# Patient Record
Sex: Female | Born: 1935 | Race: White | Hispanic: No | Smoking: Former smoker
Health system: Southern US, Community
[De-identification: ages and names within clinical notes are randomized; demographics above are authoritative.]

## PROBLEM LIST (undated history)

## (undated) DIAGNOSIS — I639 Cerebral infarction, unspecified: Secondary | ICD-10-CM

## (undated) DIAGNOSIS — G459 Transient cerebral ischemic attack, unspecified: Secondary | ICD-10-CM

## (undated) DIAGNOSIS — I519 Heart disease, unspecified: Secondary | ICD-10-CM

## (undated) DIAGNOSIS — K922 Gastrointestinal hemorrhage, unspecified: Secondary | ICD-10-CM

## (undated) DIAGNOSIS — N189 Chronic kidney disease, unspecified: Secondary | ICD-10-CM

## (undated) DIAGNOSIS — I219 Acute myocardial infarction, unspecified: Secondary | ICD-10-CM

## (undated) DIAGNOSIS — E039 Hypothyroidism, unspecified: Secondary | ICD-10-CM

## (undated) DIAGNOSIS — I714 Abdominal aortic aneurysm, without rupture, unspecified: Secondary | ICD-10-CM

## (undated) DIAGNOSIS — M810 Age-related osteoporosis without current pathological fracture: Secondary | ICD-10-CM

## (undated) DIAGNOSIS — D649 Anemia, unspecified: Secondary | ICD-10-CM

## (undated) DIAGNOSIS — Z5189 Encounter for other specified aftercare: Secondary | ICD-10-CM

## (undated) DIAGNOSIS — IMO0001 Reserved for inherently not codable concepts without codable children: Secondary | ICD-10-CM

## (undated) DIAGNOSIS — Z96641 Presence of right artificial hip joint: Secondary | ICD-10-CM

## (undated) DIAGNOSIS — I10 Essential (primary) hypertension: Secondary | ICD-10-CM

## (undated) DIAGNOSIS — I509 Heart failure, unspecified: Secondary | ICD-10-CM

## (undated) DIAGNOSIS — I2510 Atherosclerotic heart disease of native coronary artery without angina pectoris: Secondary | ICD-10-CM

## (undated) DIAGNOSIS — I4891 Unspecified atrial fibrillation: Secondary | ICD-10-CM

## (undated) DIAGNOSIS — J449 Chronic obstructive pulmonary disease, unspecified: Secondary | ICD-10-CM

## (undated) DIAGNOSIS — E785 Hyperlipidemia, unspecified: Secondary | ICD-10-CM

## (undated) DIAGNOSIS — N390 Urinary tract infection, site not specified: Secondary | ICD-10-CM

## (undated) DIAGNOSIS — I495 Sick sinus syndrome: Secondary | ICD-10-CM

## (undated) DIAGNOSIS — Z950 Presence of cardiac pacemaker: Secondary | ICD-10-CM

## (undated) DIAGNOSIS — Z8719 Personal history of other diseases of the digestive system: Secondary | ICD-10-CM

## (undated) DIAGNOSIS — K219 Gastro-esophageal reflux disease without esophagitis: Secondary | ICD-10-CM

## (undated) HISTORY — DX: Cerebral infarction, unspecified: I63.9

## (undated) HISTORY — PX: JOINT REPLACEMENT: SHX530

## (undated) HISTORY — PX: FRACTURE SURGERY: SHX138

## (undated) HISTORY — DX: Heart failure, unspecified: I50.9

## (undated) HISTORY — DX: Chronic obstructive pulmonary disease, unspecified: J44.9

## (undated) HISTORY — DX: Abdominal aortic aneurysm, without rupture: I71.4

## (undated) HISTORY — DX: Transient cerebral ischemic attack, unspecified: G45.9

## (undated) HISTORY — DX: Sick sinus syndrome: I49.5

## (undated) HISTORY — DX: Chronic kidney disease, unspecified: N18.9

## (undated) HISTORY — DX: Age-related osteoporosis without current pathological fracture: M81.0

## (undated) HISTORY — DX: Unspecified atrial fibrillation: I48.91

## (undated) HISTORY — PX: CARDIAC CATHETERIZATION: SHX172

## (undated) HISTORY — DX: Acute myocardial infarction, unspecified: I21.9

## (undated) HISTORY — DX: Abdominal aortic aneurysm, without rupture, unspecified: I71.40

## (undated) HISTORY — DX: Hypothyroidism, unspecified: E03.9

## (undated) HISTORY — DX: Atherosclerotic heart disease of native coronary artery without angina pectoris: I25.10

## (undated) HISTORY — PX: TOTAL HIP ARTHROPLASTY: SHX124

## (undated) HISTORY — PX: COLONOSCOPY: SHX174

## (undated) HISTORY — DX: Presence of right artificial hip joint: Z96.641

## (undated) HISTORY — DX: Encounter for other specified aftercare: Z51.89

## (undated) HISTORY — PX: CORONARY STENT PLACEMENT: SHX1402

## (undated) HISTORY — DX: Gastrointestinal hemorrhage, unspecified: K92.2

## (undated) HISTORY — DX: Hyperlipidemia, unspecified: E78.5

## (undated) HISTORY — DX: Essential (primary) hypertension: I10

## (undated) HISTORY — DX: Heart disease, unspecified: I51.9

---

## 1999-03-19 ENCOUNTER — Encounter: Payer: Self-pay | Admitting: Family Medicine

## 1999-03-19 ENCOUNTER — Encounter: Admission: RE | Admit: 1999-03-19 | Discharge: 1999-03-19 | Payer: Self-pay | Admitting: Family Medicine

## 2000-04-21 ENCOUNTER — Encounter: Payer: Self-pay | Admitting: Family Medicine

## 2000-04-21 ENCOUNTER — Encounter: Admission: RE | Admit: 2000-04-21 | Discharge: 2000-04-21 | Payer: Self-pay | Admitting: Family Medicine

## 2004-08-14 ENCOUNTER — Inpatient Hospital Stay (HOSPITAL_COMMUNITY): Admission: EM | Admit: 2004-08-14 | Discharge: 2004-08-22 | Payer: Self-pay | Admitting: Emergency Medicine

## 2005-03-15 DIAGNOSIS — K922 Gastrointestinal hemorrhage, unspecified: Secondary | ICD-10-CM

## 2005-03-15 HISTORY — DX: Gastrointestinal hemorrhage, unspecified: K92.2

## 2005-09-22 ENCOUNTER — Encounter: Admission: RE | Admit: 2005-09-22 | Discharge: 2005-09-22 | Payer: Self-pay | Admitting: Family Medicine

## 2005-09-27 ENCOUNTER — Encounter: Admission: RE | Admit: 2005-09-27 | Discharge: 2005-09-27 | Payer: Self-pay | Admitting: Cardiology

## 2005-11-24 ENCOUNTER — Ambulatory Visit: Payer: Self-pay | Admitting: Internal Medicine

## 2005-12-01 ENCOUNTER — Inpatient Hospital Stay (HOSPITAL_COMMUNITY): Admission: EM | Admit: 2005-12-01 | Discharge: 2005-12-08 | Payer: Self-pay | Admitting: Emergency Medicine

## 2005-12-08 ENCOUNTER — Ambulatory Visit: Payer: Self-pay | Admitting: Internal Medicine

## 2006-02-01 ENCOUNTER — Ambulatory Visit: Payer: Self-pay | Admitting: Internal Medicine

## 2006-02-07 ENCOUNTER — Ambulatory Visit (HOSPITAL_COMMUNITY): Admission: RE | Admit: 2006-02-07 | Discharge: 2006-02-07 | Payer: Self-pay | Admitting: Internal Medicine

## 2006-02-15 ENCOUNTER — Inpatient Hospital Stay (HOSPITAL_COMMUNITY): Admission: EM | Admit: 2006-02-15 | Discharge: 2006-02-21 | Payer: Self-pay | Admitting: Emergency Medicine

## 2006-02-18 ENCOUNTER — Ambulatory Visit: Payer: Self-pay | Admitting: Internal Medicine

## 2006-03-24 ENCOUNTER — Encounter (INDEPENDENT_AMBULATORY_CARE_PROVIDER_SITE_OTHER): Payer: Self-pay | Admitting: Cardiology

## 2006-03-24 ENCOUNTER — Inpatient Hospital Stay (HOSPITAL_COMMUNITY): Admission: EM | Admit: 2006-03-24 | Discharge: 2006-03-29 | Payer: Self-pay | Admitting: Emergency Medicine

## 2006-07-01 ENCOUNTER — Emergency Department (HOSPITAL_COMMUNITY): Admission: EM | Admit: 2006-07-01 | Discharge: 2006-07-02 | Payer: Self-pay | Admitting: Emergency Medicine

## 2007-02-27 ENCOUNTER — Inpatient Hospital Stay (HOSPITAL_COMMUNITY): Admission: EM | Admit: 2007-02-27 | Discharge: 2007-03-02 | Payer: Self-pay | Admitting: Emergency Medicine

## 2007-02-27 ENCOUNTER — Encounter (INDEPENDENT_AMBULATORY_CARE_PROVIDER_SITE_OTHER): Payer: Self-pay | Admitting: Internal Medicine

## 2007-03-01 ENCOUNTER — Ambulatory Visit: Payer: Self-pay | Admitting: Vascular Surgery

## 2007-03-27 ENCOUNTER — Inpatient Hospital Stay (HOSPITAL_COMMUNITY): Admission: EM | Admit: 2007-03-27 | Discharge: 2007-03-29 | Payer: Self-pay | Admitting: Emergency Medicine

## 2007-03-31 ENCOUNTER — Ambulatory Visit: Payer: Self-pay | Admitting: Internal Medicine

## 2007-10-02 ENCOUNTER — Inpatient Hospital Stay (HOSPITAL_COMMUNITY): Admission: EM | Admit: 2007-10-02 | Discharge: 2007-10-09 | Payer: Self-pay | Admitting: Emergency Medicine

## 2007-10-05 DIAGNOSIS — I495 Sick sinus syndrome: Secondary | ICD-10-CM

## 2007-10-05 HISTORY — DX: Sick sinus syndrome: I49.5

## 2007-10-05 HISTORY — PX: PACEMAKER INSERTION: SHX728

## 2008-04-22 LAB — HM DEXA SCAN

## 2008-09-11 ENCOUNTER — Encounter: Admission: RE | Admit: 2008-09-11 | Discharge: 2008-09-11 | Payer: Self-pay | Admitting: Otolaryngology

## 2009-09-19 LAB — HM MAMMOGRAPHY: HM Mammogram: NORMAL

## 2010-07-20 ENCOUNTER — Telehealth: Payer: Self-pay | Admitting: Internal Medicine

## 2010-07-21 ENCOUNTER — Telehealth: Payer: Self-pay | Admitting: Internal Medicine

## 2010-07-21 NOTE — Telephone Encounter (Signed)
Scheduled patient to see Willette Cluster, NP on 07/22/10 at 2:30 PM

## 2010-07-21 NOTE — Telephone Encounter (Signed)
The patient's other daughter called back and she is given the appt for 07/22/10 2:00

## 2010-07-21 NOTE — Telephone Encounter (Signed)
Left message for patient to call back to Solon Augusta the patient's daughter [phone]

## 2010-07-22 ENCOUNTER — Encounter: Payer: Self-pay | Admitting: Nurse Practitioner

## 2010-07-22 ENCOUNTER — Ambulatory Visit (INDEPENDENT_AMBULATORY_CARE_PROVIDER_SITE_OTHER): Payer: Medicare Other | Admitting: Nurse Practitioner

## 2010-07-22 VITALS — BP 90/42 | HR 84 | Ht 62.0 in | Wt 129.0 lb

## 2010-07-22 DIAGNOSIS — R1310 Dysphagia, unspecified: Secondary | ICD-10-CM

## 2010-07-22 NOTE — Patient Instructions (Signed)
We have scheduled the Barium Swallow with Tablet at [facility]. Date is 07-28-2010. Arrive at [facility] 1st floor We will call you with the results.

## 2010-07-22 NOTE — Progress Notes (Signed)
07/22/2010 [name] [id number] 1917-06-18   History of Present Illness:  [name] is a 77 year old female with multiple medical problems including but not limited to, coronary artery disease with history of stents, atrial fibrillation , pacemaker, history of transient ischemic attack, hypertension, COPD and hypothyroidism. She is on chronic Plavix. Patient was last seen by [place] for anemia and Hemoccult-positive stool while hospitalized in 2009. At that time she underwent an upper endoscopy with APC of nonbleeding angiodysplasia. Prior to that encounter the patient was evaluated by [place] for dysphasia 2007. At that time she had an upper endoscopy with empirical dilation. Within a couple of months her dysphasia returned and a barium swallow with tablet was obtained for suspected presbyesophagus. The study showed normal esophageal motility and moderate gastroesophageal reflux with siphon test .The tablet passed through the esophagus without difficulty. Over the years the patient has been managing her symptoms but her daughter called here yesterday to report a recent 8 pound weight loss. Patient feels her "swallowing" is okay but she just gets full after a few bites of food. Generally eats part of the medial and returns 2 hours later to complete the rest. This sounds similar to what she was experiencing back in 2007. No nausea. No abdominal or chest pain. Patient has no GERD symptoms. She takes Zantac 300 mg daily.        Past Medical History  Diagnosis Date  . GI bleed 2007  . Atrial fibrillation   . CAD (coronary artery disease)   . COPD (chronic obstructive pulmonary disease)   . Hypertension   . TIA (transient ischemic attack)   . Hypothyroidism   . Renal failure, chronic   . Abdominal aortic aneurysm    Past Surgical History  Procedure Date  . Pacemaker insertion   . Total hip arthroplasty   . Coronary stent placement     x9    reports that she has quit smoking. She has never used  smokeless tobacco. She reports that she does not drink alcohol or use illicit drugs. family history includes Emphysema in her father and Heart disease in her mother. Allergies  Allergen Reactions  . Nubain (Nalbuphine Hcl) Anaphylaxis  . Iodine       Outpatient Encounter Prescriptions as of 07/22/2010  Medication Sig Dispense Refill  . amiodarone (PACERONE) 200 MG tablet Take 200 mg by mouth daily.        [name] aspirin 81 MG tablet Take 81 mg by mouth daily.        . calcium-vitamin D (OSCAL WITH D) 500-200 MG-UNIT per tablet Take 1 tablet by mouth daily.        . clopidogrel (PLAVIX) 75 MG tablet Take 75 mg by mouth daily.        . ferrous sulfate 325 (65 FE) MG tablet Take 325 mg by mouth daily with breakfast.        . furosemide (LASIX) 20 MG tablet Take 20 mg by mouth every other day.        . levothyroxine (SYNTHROID, LEVOTHROID) 100 MCG tablet Take 100 mcg by mouth daily.        . metoprolol succinate (TOPROL-XL) 25 MG 24 hr tablet Take 37.5 mg by mouth daily. Take 1 & 1/2 tablet twice daily       . potassium chloride (KLOR-CON) 10 MEQ CR tablet Take 10 mEq by mouth every other day.        . pravastatin (PRAVACHOL) 40 MG tablet Take 40 mg by  mouth at bedtime.        . ranitidine (ZANTAC) 300 MG tablet Take 300 mg by mouth at bedtime.        . vitamin B-12 (CYANOCOBALAMIN) 100 MCG tablet Take 50 mcg by mouth daily.        [name] DISCONTD: pantoprazole (PROTONIX) 40 MG tablet Take 40 mg by mouth daily.          ROS: All other systems reviewed and negative except where noted in the History of Present Illness.   Physical Exam: General: Well developed white female in no acute distress Head: Normocephalic and atraumatic Eyes:  sclerae anicteric,conjunctive pink. Ears: Normal auditory acuity Mouth: No deformity or lesions Neck: Supple, no masses.  Lungs: Clear throughout to auscultation Heart: Regular rate and rhythm; no murmurs heard Abdomen: Soft, non tender and non distended. No masses  or hepatomegaly noted. Normal Bowel sounds Rectal: Not done Musculoskeletal: Symmetrical with no gross deformities  Skin: No lesions on visible extremities Extremities: No edema or deformities noted Neurological: Alert oriented x 4, grossly nonfocal Cervical Nodes:  No significant cervical adenopathy Psychological:  Alert and cooperative. Normal mood and affect  Assessment and Plan: Dysphasia: Not entirely clear what is going on but the patient describes feeling full after a few bites of food. She points to her chest as the area of fullness. Patient described similar symptoms in 2007 when an EGD and barium swallow with tablet were normal. This could be early satiety instead of dysphasia. Patient could have gastroparesis despite the fact that she has no nausea. Even if that is the case, I would be concerned about a trial of Reglan in this 77 year old patient. I  think that the risk of upper endoscopy with esophageal dilation outweighs the benefits but will obtain a barium swallow with tablet to make sure that nothing has changed over the last 5 years. Patient will be called with test results and further recommendations. In the meantime we discussed ways to avoid food impaction (soft foods, small bites, chewing well, adequate fluid intake with meals). Patient has no history of esophagitis. She has no GERD symptoms. Daily Zantac is fine for now.

## 2010-07-23 NOTE — Progress Notes (Signed)
Reviewed and agree with management. Sevrin Sally T. Hrishikesh Hoeg MD FACG 

## 2010-07-28 ENCOUNTER — Ambulatory Visit (HOSPITAL_COMMUNITY)
Admission: RE | Admit: 2010-07-28 | Discharge: 2010-07-28 | Disposition: A | Discharge status: Home / Self Care | Payer: Medicare Other | Source: Ambulatory Visit | Attending: Nurse Practitioner | Admitting: Nurse Practitioner

## 2010-07-28 DIAGNOSIS — K219 Gastro-esophageal reflux disease without esophagitis: Secondary | ICD-10-CM | POA: Insufficient documentation

## 2010-07-28 DIAGNOSIS — R1310 Dysphagia, unspecified: Secondary | ICD-10-CM

## 2010-07-28 DIAGNOSIS — K224 Dyskinesia of esophagus: Secondary | ICD-10-CM | POA: Insufficient documentation
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
## 2010-07-28 NOTE — Procedures (Signed)
CLINICAL HISTORY:  An 77 year old woman with a history of transient  right facial droop and garbled speech.  EEG is for evaluation of a  possible seizure.   DESCRIPTION:  The dominant rhythm of this tracing is a moderate  amplitude alpha rhythm of 8-9 Hz which predominates posteriorly, appears  without abnormal asymmetry, attenuates with opening and closing.  Low  amplitude fast activity is seen frontally and centrally.  On the left,  fairly consistently, a 6-7 Hz rhythm emanates from the temporoparietal  areas, which is not seen on the right.  No epileptiform discharges were  seen.  The patient appeared to remain in the awake state throughout the  recording.  Photic stimulation and hyperventilation were not performed.  Single channel devoted to EKG revealed sinus rhythm throughout, the rate  approximately 54 beats per minute.   CONCLUSIONS:  Abnormal study due to the presence of:  1. Borderline slowing of the background rhythms, a finding related      either to aging or potentially to the diffuse mild widespread      cerebral dysfunction as seen in encephalopathy or dementia.  2. Focal slowing of the left hemisphere, a finding suggestive of      underlying focal pathology.  3. Sinus bradycardia on EKG tracing.  No epileptiform discharges seen.      Michael L. Thad Ranger, M.D.  Electronically Signed     OVF:IEPP  D:  03/01/2007 17:49:36  T:  03/02/2007 11:29:17  Job #:  [id number]
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
## 2010-07-28 NOTE — Discharge Summary (Signed)
NAMEKATELY, GRAFFAM NO.:  [id number]   MEDICAL RECORD NO.:  [id number]          PATIENT TYPE:  INP   LOCATION:  4712                         FACILITY:  [facility]   PHYSICIAN:  Ritta Slot, MD     DATE OF BIRTH:  [birth year]   DATE OF ADMISSION:  10/02/2007  DATE OF DISCHARGE:  10/09/2007                               DISCHARGE SUMMARY   Ms. Shadavia Dampier is an 77 year old female patient, very active, still  driving her Red Camaro, came into the hospital with shortness of breath  and increased heart rate.  She does have a history of PAF and sick sinus  syndrome.  She has been on amiodarone in the past, but she had an  allergic reaction and rash as she states from that plus she had  bradycardia and she was taken off her amiodarone and beta-blockers in  the past; this had to be decreased.  She has not been on Coumadin  recently in the past because of GI bleeds; the last one being on March 28, 2007.  She has had a history of duodenal AVMs, duodenitis, and an  ulcer in 2007.  She did have an AVM ablation on March 28, 2007.  She  had an increased bleed after aspirin was added to Plavix because of a  history of TIAs.  She came into the hospital, it was felt that she could  be tried on Bystolic; this was attempted, but she still went into atrial  fib on the Bystolic, and this was discontinued then and we were going to  try Betapace; however, because of her renal function, she is not able to  take Betapace.  It was discussed with her about possible pacemaker by  Dr. Lynnea Ferrier after her and her family talked, this was decided that she  should have pacemaker implantation.  She apparently had a lot of  palpitations at home where it can last for hours at a high heart rate.  Thus, on October 05, 2007, she had a dual-chamber pacemaker implanted; a  Medtronic Adapta ADDRS1, serial number [id number].  She had two  Medtronic leads placed.  The following day, with her pacemaker  interrogation, it was suspected that her atrial lead had fallen out of  her atrium and entered her ventricle.  She was made n.p.o.  Dr. Lynnea Ferrier  was called and he came in and repositioned the atrial lead.  She was  kept over the weekend on IV antibiotics, specifically Ancef.  She had no  further problems, except that on October 09, 2007, early morning, she had a  15-minute episode of Afib, rate of 100-110.  She felt like her heart was  pounding in her throat.  She felt that it lasted much longer than 15  minutes.  She was seen by Dr. Lynnea Ferrier on October 09, 2007, he decided to  increase her metoprolol to 37.5 b.i.d. and to put her on Augmentin  500/125 b.i.d. until October 13, 2007.  He will see her in the office for  wound site check and he considered her stable for discharge  home.   Her blood pressure was 113/47, heart rate was 54, respirations 20, and  temperature was 97.6.   LABORATORY DATA:  Showed hemoglobin of 12.3, hematocrit 36.9, WBC 6.1,  and platelets were low at 89.  Sodium of 138, potassium 4.5, BUN 30,  creatinine 1.47, and glucose was 88.   DISCHARGE MEDICATIONS:  1. Plavix 75 mg daily.  2. Furosemide 20 mg on Monday, Wednesday, and Friday.  3. Metoprolol tartrate one and a half 25 mg tablet twice per day.  4. Pravastatin 40 mg at bedtime.  5. Protonix 40 mg a day.  6. Synthroid 100 mcg every day.  7. Klor-Con 10 mEq Monday, Wednesday, and Friday.  8. Iron 325 mg a day.  9. Vitamin B12 daily.  10.Calcium daily.  11.She should start Augmentin 500/125 twice per day until October 13, 2007.   She was given pacemaker instructions.  She was told by Dr. Lynnea Ferrier about  her pacemaker instructions.  She should not drive for 1 week.  She may  shower on October 14, 2007.   DISCHARGE DIAGNOSES:  1. Paroxysmal atrial fibrillation/sick sinus syndrome.  2. Status post Medtronic pacemaker insertion on October 05, 2007,      complicated with lead displacement with repositioning and  revision      of the atrial lead on October 06, 2007.  3. Thrombocytopenia.  4. History of duodenal arteriovenous malformations, duodenitis, and      ulcer in December 2007.  She had recurrent gastrointestinal bleed      after aspirin was added to her Plavix because of a transient      ischemic attack and had an arteriovenous malformation ablation on      March 28, 2007.  5. History of transient ischemic attack.  6. History of coronary artery disease with drug-eluting stenting in      2006.  7. History of dysphagia and esophageal dilatation.  8. Hypothyroidism.  9. History of hypertension.  10.Chronic obstructive pulmonary disease.  11.Chronic renal failure.  12.Abdominal aortic aneurysm.  13.History of right hip fracture and repair.  14.Atherosclerotic pulmonary vascular disease, status post right iliac      artery stent, 70% left renal artery stent and her AAA was 2.5 x      2.5.      Lezlie Octave, N.P.      Ritta Slot, MD  Electronically Signed    BB/MEDQ  D:  10/09/2007  T:  10/10/2007  Job:  [id number]   cc:   Gerlene Burdock A. Alanda Amass, M.D.  Tammy R. Collins Scotland, M.D.

## 2010-07-28 NOTE — Discharge Summary (Signed)
[name], [name] NO.:  [id number]   MEDICAL RECORD NO.:  [id number]          PATIENT TYPE:  INP   LOCATION:  2011                         FACILITY:  [facility]   PHYSICIAN:  Richard A. Alanda Amass, M.D.DATE OF BIRTH:  07/30/1917   DATE OF ADMISSION:  03/27/2007  DATE OF DISCHARGE:  03/29/2007                               DISCHARGE SUMMARY   DISCHARGE DIAGNOSES:  1. Gastrointestinal bleed this admission, aspirin discontinued.  2. Dyspnea and fatigue secondary to above.  3. Anemia, blood loss, transfused this admission.  4. Question of a recent transient ischemic attack 3 weeks ago.  5. History of paroxysmal atrial fibrillation with bradycardia on beta      blocker and amiodarone.  Currently off both these drugs.  6. Known coronary disease with prior DES to RCA June, 2006.  Will      continue her on Plavix.  7. Mild renal insufficiency with creatinine 1.6.   HOSPITAL COURSE:  [name] is an 77 year old female well known to Dr.  Alanda Amass with a history of coronary artery disease and PAF as noted  above.  A few weeks ago, it was suspected she might have had a TIA and  aspirin was added to her Plavix.  She does have a history of GI bleeding  and AVMs.  She presented to the office March 27, 2007 with weakness  and dyspnea on exertion.  Her hemoglobin was 9.9.  She gave a history of  tarry stools.  She was admitted to telemetry and her aspirin was  stopped.  She was transfused 2 units.  She was seen in consult by  [name].  Her hemoglobin improved to 12 after transfusion and  her symptoms improved.  She was taken down to the endoscopy lab March 28, 2007.  Endoscopy revealed a duodenal AVM that was ablated by Dr.  Marina [name].  We feel she can be discharged March 29, 2007.  Plan is for no  aspirin.  She will continue her Plavix.   DISCHARGE MEDICATIONS:  1. B12 daily.  2. Protonix 40 mg daily.  3. Plavix 75 mg daily.  4. Synthroid 0.1 mg daily.  5.  Potassium 10 mEq daily.  6. Lasix 20 mg daily.   LABS:  At discharge, white count 5.3, hemoglobin 12.2, hematocrit 36.1,  platelets 202.  Sodium 140, potassium 4.5, BUN 19, creatinine 1.67.  Troponin negative.  LDL 94, HDL 34.  BNP 238.  Telemetry sinus rhythm.   DISPOSITION:  The patient discharged in stable condition and will follow  up with Dr. Alanda Amass in a couple of weeks in the office.      [name], P.A.      Richard A. Alanda Amass, M.D.  Electronically Signed    LKK/MEDQ  D:  03/29/2007  T:  03/29/2007  Job:  [id number]   cc:   [name] GI  [name]. Juanda Chance, MD  Richard A. Alanda Amass, M.D.

## 2010-07-28 NOTE — H&P (Signed)
[name], [name] NO.:  [id number]   MEDICAL RECORD NO.:  [id number]          PATIENT TYPE:  EMS   LOCATION:  ED                           [facility]ACILITY[facility]   PHYSICIAN:  Hillery Aldo, M.D.   DATE OF BIRTH:  August 29, 1917   DATE OF ADMISSION:  02/27/2007  DATE OF DISCHARGE:                              HISTORY & PHYSICAL   PRIMARY CARE PHYSICIAN:  Tammy R. Collins Scotland, M.D.   CARDIOLOGIST:  Richard A. Alanda Amass, M.D.   CHIEF COMPLAINT:  Swallowing, speaking difficulties.   HISTORY OF PRESENT ILLNESS:  The patient is an 77 year old female who  developed the sudden onset of word finding difficulty and facial  weakness witnessed by her daughter while driving the car today.  The  patient seemed to have a strangling sensation.  The patient's daughter  became concerned and gave her an aspirin to take and subsequently called  9-1-1.  The patient's speech gradually improved but, according to her  daughter, is not yet at baseline.  She has had some intermittent  headaches but none currently.  She has a history of chronic dysphagia  and has had to have esophageal dilatation in the past.  However, the  dysphagia she experienced earlier today was new and different than her  usual baseline dysphagia.  The only other significant complaint she has  had is poor sleep and generalized pruritus with a rash of uncertain  etiology.   PAST MEDICAL HISTORY:  1. Three-vessel coronary artery disease status post stenting of the      LAD, circumflex, and right coronary artery.  2. Chronic obstructive pulmonary disease.  3. Hypertension.  4. Abdominal abdominal aortic aneurysm.  5. Hiatal hernia with gastroesophageal reflux disease.  6. Paroxysmal atrial fibrillation.  7. Dyslipidemia.  8. 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  9. Hypothyroidism.  10.Chronic renal insufficiency.  11.Chronic dysphagia status post esophageal dilatation.  12.Right hip fracture status  post repair.  13.Peripheral vascular disease status post right iliac artery stent.  14.Renal artery stenosis, 70% on the left.  15.History of esophagitis.  16.Carotid artery stenosis status post PCI on the right.   FAMILY HISTORY:  The patient's mother died at 5 from old age.  She had  a history of breast cancer as well.  The patient's father died at 35  from complications of COPD.  She has one brother who died in a motor  vehicle accident, another brother who died of esophageal cancer, and a  sister who died of possibly Graves' disease as well as breast cancer.  She has a sister who is alive and currently suffers with recurrent GI  bleeding with colectomy x2.  She has four healthy offspring.   SOCIAL HISTORY:  The patient is married and lives with her husband.  She  is a lifelong nonsmoker.  She does not drink alcohol.  She has been  active with doing some farm work.  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   ALLERGIES:  IODINE and NUBAIN cause uncertain reaction.   MEDICATIONS:  1. Amiodarone 200 mg  daily.  2. Fosamax 70 mg weekly.  3. Flonase 2 sprays in each nostril daily.  4. Zantac 150 mg b.i.d.  5. Lasix 20 mg daily.  6. Metoprolol 50 mg b.i.d.  7. Nitroglycerin 0.4 mg sublingual p.r.n.  8. Plavix 75 mg daily.  9. Protonix 40 mg daily.  10.Potassium chloride 10 mEq daily.  11.Synthroid 100 mcg daily.   REVIEW OF SYSTEMS:  The patient's appetite has been good.  Poor sleep  secondary to the rash as noted above.  No nausea, vomiting or diarrhea.  No blood in the stool.  No chest pain or shortness of breath.  No cough.   PHYSICAL EXAMINATION:  VITAL SIGNS:  Temperature 97.3, pulse 60,  respirations 20, blood pressure 142/69.  GENERAL:  Well-developed, well-nourished female who is in no acute  distress.  HEENT:  Normocephalic, atraumatic.  PERRL.  EOMI.  Visual fields are  full.  Palate rises symmetrically.  Tongue is midline with a white  coating.  NECK:   Supple, no thyromegaly, no lymphadenopathy, no jugular venous  distention.  CHEST:  Lungs clear to auscultation bilaterally with good air movement.  HEART:  Bradycardic rate, regular rhythm.  ABDOMEN:  Soft, nontender, nondistended with normoactive bowel sounds.  EXTREMITIES:  No clubbing, edema, cyanosis.  SKIN:  Warm and dry.  Macular rash most notable on the anterior chest.  NEUROLOGIC:  The patient is alert and oriented x3.  Cranial nerves II-  XII are grossly intact with the exception of a very subtle facial  weakness on the right.  No visual field cuts.  She does have a mild  expressive aphasia.  At this time, her word finding ability is intact,  however, her speech is somewhat slurred and not back to baseline  according to the family.  She moves all extremities x4 with equal  strength.  Deep tendon reflexes are 2+ and brisk bilaterally.   DATA REVIEW:  A 12-lead EKG shows a junctional rhythm at a rate of 48  beats per minute.  Telemetry is showing ongoing bradycardia but  occasional P-waves.   CT scan of the head shows bradycardia, no bleed.  There is no CT  evidence of a large acute infarct.  MRI of the brain has been obtained  and has not yet been read by the neuroradiologist.   LABORATORY DATA:  White blood cell count 5.9, hemoglobin 12.6,  hematocrit 37.4, platelets 128.  Sodium is 143, potassium 5, chloride  108, bicarb 30, BUN 18, creatinine 1.48, glucose 93, alkaline  phosphatase 73, total bilirubin 0.6, AST 42, ALT 33, total protein 5.7,  albumin 3.2, calcium 9.3.   ASSESSMENT/PLAN:  1. Transient ischemic attack versus acute cerebrovascular accident:      We will admit the patient and obtain an MRI scan.  The patient has      currently been on Plavix but has a known history of atrial      fibrillation, so we will obtain a 2-D echocardiogram to rule out      embolic disease.  She has some mild neurologic deficits on clinical      exam.  She has been maintained on  Plavix in the outpatient setting.      The addition of aspirin to her regimen would be risky given her      known history of chronic blood loss anemia due to duodenal      arteriovenous malformations.  She will likely need strict risk      factor  modification.  We will check a hemoglobin A1c to rule out      diabetes, homocystine level to rule out hyperhomocystinemia, and a      fasting lipid panel to determine if she has any ongoing      dyslipidemia which may be amendable to treatment.  We will also      check a TSH level to ensure that she is adequately replaced.  Given      her bradycardia, she may have simply had a problem with perfusion      to the brain resulting in a transient ischemic attack.  She will      need to have her metoprolol dose adjusted accordingly.  We will      evaluate her swallowing and obtain a speech therapy consult as well      as a physical therapy and occupational therapy consult.  2. Junctional rhythm/bradycardia:  The patient likely has too much      beta blocker on board.  We will hold her medications until she has      a speech therapy evaluation for safety regarding her ability to      swallow.  We will use IV metoprolol as needed for hypertension.  3. Hypertension:  Would not be overly aggressive in controlling her      blood pressure in this acute setting.  I will hold her Lasix until      her swallowing function is evaluated.  She can have metoprolol on      an as-needed basis for systolic pressures greater than 160 or      diastolic pressures greater than 90.  4. Hypothyroidism:  We will continue the patient's Synthroid at half      the usual dose through the IV route.  Will check a TSH to ensure      she is appropriately replaced.  5. Coronary artery disease:  The patient has not had any chest pains      or symptoms of angina.  We will check a 2-D echocardiogram and      resume her Plavix as soon as she can safely swallow.  6. Chronic obstructive  pulmonary disease:  The patient seems to be      well-controlled.  She is not on any outpatient therapy for this.      Her lung exam is benign.  Would use albuterol nebulized treatments      p.r.n. if needed.  7. Chronic atrial fibrillation:  Patient is not in atrial fibrillation      currently.  Will put her on a low dose amiodarone drip until she is      able to take her p.o. amiodarone.  8. History of chronic blood loss anemia:  The patient is not currently      anemic.  Will monitor her blood counts regularly.  9. Chronic renal insufficiency:  The patient's creatinine is 1.48 with      an estimated GFR of 33.  Her baseline renal function is unclear.      We will monitor this closely.  10.Mild thrombocytopenia:  The patient is mildly thrombocytopenic, but      is not having any signs of active bleeding.  11.Dysphagia/history of esophagitis/hiatal hernia/gastroesophageal      reflux disease:  Will continue the patient's Protonix through the      IV route.  Would hold Zantac given that this is a relatively new      medication and she has  a new onset of rash.  12.Rash:  There are two possible culprits.  Zantac was recently added      and the other possibility is a generic change in her prescription      of Synthroid.  Given that we will be giving her Synthroid through      the IV route and holding the Zantac, hopefully the rash will      resolve.  Would administer a dose of Solu-Medrol now to help with      her      current ongoing pruritus problem.  13.Prophylaxis:  Use Lovenox for deep venous thrombosis prophylaxis      and IV Protonix for gastrointestinal prophylaxis.      Hillery Aldo, M.D.  Electronically Signed     CR/MEDQ  D:  02/27/2007  T:  02/27/2007  Job:  [id number]   cc:   Tammy R. Collins Scotland, M.D.  Fax: [phone]   Richard A. Alanda Amass, M.D.  Fax: [phone]

## 2010-07-29 ENCOUNTER — Telehealth: Payer: Self-pay | Admitting: *Deleted

## 2010-07-29 NOTE — Telephone Encounter (Signed)
Message copied by Jesse Fall on Wed Jul 29, 2010  3:05 PM ------      Message from: Willette Cluster      Created: Wed Jul 29, 2010  2:50 PM       Rene Kocher, her swallowing problems appear to be from dysmotility. No strictures or other abnormalities were identified. Please let her know these results. She will need to continue small bites of food, chew well and drink plenty of fluids with meals. I will forward this to Dr. Russella Dar to see if he has any other comments / suggestions. Thanks

## 2010-07-29 NOTE — Telephone Encounter (Signed)
Patient notified of results and Willette Cluster, NP recommendations.

## 2010-07-29 NOTE — Progress Notes (Signed)
Agree with PGs recommendations.

## 2010-07-31 NOTE — H&P (Signed)
NAMEEVANGELYNE, [name] NO.:  [id number]   MEDICAL RECORD NO.:  [id number]          PATIENT TYPE:  INP   LOCATION:  0108                         FACILITY:  [facility]   PHYSICIAN:  Lonia Blood, M.D.       DATE OF BIRTH:  08-Apr-1917   DATE OF ADMISSION:  03/23/2006  DATE OF DISCHARGE:                              HISTORY & PHYSICAL   PRIMARY CARE PHYSICIAN:  Dr. Bayard Beaver. Spear.   PRIMARY CARDIOLOGIST:  Dr. Susa Griffins.   CHIEF COMPLAINTS:  Fall and pain in the right hip.   HISTORY OF PRESENT ILLNESS:  [name] presented to [facility]  Emergency Room tonight after she had a fall which resulted in a hip  fracture.  She was talking on the phone with her sister and she wanted  to sit back on a chair, but she missed the chair and landed on the  floor.  Immediately after the fall, she started complaining of  significant right hip pain and she had to call 9-1-1 to bring her to the  hospital.  The patient's past medical history is very complicated  including multiple stents placed to the LAD, circumflex and RCA, so for  this reason, we were called to provide admission to the hospital and the  Orthopedic Service will provide consultation.  Currently, [name]  denies any chest pain, shortness of breath, nausea, vomiting or  abdominal pain.  The patient had a recent admission to [facility] for anemia and she reports that after she was discharged from  [facility], she went to the beach, where she took long walks  without any recurrence of her chest pain or shortness of breath.   PAST MEDICAL HISTORY:  1. Coronary artery disease, three-vessel coronary artery disease,      status post stents in the LAD, circumflex and RCA.  2. Atrial fibrillation, currently in normal sinus rhythm, maintained      by the use of amiodarone.  3. Esophagitis.  4. Reported normal left ventricular function.  5. Peripheral vascular disease with right iliac artery stent.  6. Hypertension.  7. Hyperlipidemia.  8. Hypothyroidism.  9. Chronic blood loss anemia secondary to GI loss.  Source was found      to be a duodenal arteriovenous malformation.  10.COPD.   HOME MEDICATIONS:  1. Protonix 40 mg daily.  2. Synthroid 100 mcg daily.  3. Plavix 75 mg daily.  4. Lasix -- unknown dose.  5. Potassium 20 mEq daily.  6. Amiodarone 200 mg once a day.  7. Metoprolol 25 mg daily.  8. Pravastatin 80 mg daily.   SOCIAL HISTORY:  [name] is married and lives with her husband.  She has 3 daughters.  She never smoked cigarettes and does not drink  alcohol.  She is still extremely active, working at her farm.   FAMILY HISTORY:  Noncontributory secondary to age.   REVIEW OF SYSTEMS:  As per HPI, also positive for the fact that the  patient has hypoacusia, that she is wearing glasses, but all other  systems are negative.   PHYSICAL EXAMINATION:  VITAL SIGNS:  Physical examination upon admission  shows a temperature of 97.8, heart rate 53, blood pressure 137/49,  respiratory rate is 20, saturation 97% on room air.  GENERAL APPEARANCE:  A well-developed, well-nourished woman in no acute  distress.  HEENT:  Head is normocephalic, atraumatic.  Eyes:  Pupils are equal,  round and reactive to light and accommodation.  The patient is alert and  oriented to place, person and time.  NECK:  Bilateral bruits.  No JVD.  CHEST:  Clear to auscultation bilaterally without wheezes, rhonchi or  crackles.  HEART:  Bradycardic with 4/6 systolic murmur best heard at the right  upper sternal border with clear radiation in bilateral carotids.  ABDOMEN:  Soft, nontender and non-distended.  Bowel sounds are present.  LOWER EXTREMITIES: There is a deformity of the right lower extremity.  There is no edema in the lower extremities.  NEUROLOGICAL:  Unable to  perform complete neurological exam, but the patient has no cranial nerve  abnormalities.  She has intact speech,  repetition.  Memory seems to be  very good for the age.   LABORATORY VALUES:  At time of admission , sodium 139, potassium 4.7,  chloride 102, bicarb 32, BUN 19, creatinine 1.5, calcium 9.3; a white  blood cell count of 6.4, hemoglobin 12.4, platelet count is 162,000;  myoglobin is 109, CK-MB less than 1, troponin I less than 0.05.   EKG shows marked sinus bradycardia with some borderline prolonged Q-T  interval.  There are no ST-T changes.   ASSESSMENT AND PLAN:  Hip fracture.  [name] is a fairly functional  woman who values her independence very much.  This makes the decision  about the operative course even more so difficult.  The orthopedic  consultation was provided by Dr. Carola Frost and Orthopedic Service is ready  to proceed with open reduction and internal fixation.  At this point in  time, I do not feel comfortable clearing this patient for surgery, given  the fact that she has got multiple stents and she has got a murmur that  could be related to aortic stenosis.  I will obtain a Cardiology  consultation in the morning and a transthoracic echocardiogram in the  morning to further evaluate.  At this point in time, I am not ready to  take this patient off of Plavix unless Dr. Alanda Amass approves, since she  has 3 drug-eluting stents.      Lonia Blood, M.D.  Electronically Signed     SL/MEDQ  D:  03/24/2006  T:  03/24/2006  Job:  [id number]   cc:   Tammy R. Collins Scotland, M.D.  Fax: [phone]

## 2010-07-31 NOTE — Discharge Summary (Signed)
[name], [name] NO.:  [id number]   MEDICAL RECORD NO.:  [id number]          PATIENT TYPE:  INP   LOCATION:  1408                         FACILITY:  [facility]   PHYSICIAN:  Marcellus Scott, MD     DATE OF BIRTH:  October 09, 1917   DATE OF ADMISSION:  03/23/2006  DATE OF DISCHARGE:                               DISCHARGE SUMMARY   Date of discharge to be determined.   PATIENT'S PRIMARY CARE PHYSICIAN:  Dr. Earma Reading.   PATIENT'S PRIMARY CARDIOLOGIST:  Dr. Susa Griffins.   DISCHARGE DIAGNOSES:  1. Right hip fracture.  2. Acute blood loss anemia.  3. Renal insufficiency.  4. Thrombocytopenia.  5. Coronary artery disease.  6. Paroxysmal atrial fibrillation.  7. Hypothyroidism.  8. Hypertension  9. Dyslipidemia  10.COPD   DISCHARGE MEDICATIONS:  To be finalized on actual discharge which may  include:  1. Senokot-S 1 tablet p.o. nightly.  2. Synthroid 100 mcg p.o. daily.  3. Plavix 75 mg p.o. daily.  4. Pravachol 80 mg p.o. nightly.  5. Potassium chloride 10 mEq p.o. daily.  6. Amiodarone 200 mg p.o. daily.  7. Protonix 40 mg p.o. daily.  8. Lasix which is 10 mg p.o. daily, currently on hold, to be restarted      as deemed necessary.  9. Lovenox 30 mg subcutaneously daily.  The duration of which is to be      determined.  This can be discontinued once the patient is not      deemed to be at further risk for thrombotic events.  10.Toprol XL 25 mg p.o. daily;.  11.Flexeril 5 mg p.o. q.6 hourly p.r.n.  12.Zofran 4 mg IV q.8 hourly p.r.n.  13.Reglan 5 mg IV q.6 hourly p.r.n.  14.Tylenol 650 mg p.o. q.4 hourly p.r.n.  15.Dulcolax suppositories 10 mg per rectum p.r.n.  16.Cepacol lozenges 1 tablet to suck p.r.n.   PROCEDURES:  1. On March 24, 2006, patient had an x-ray of the right hip.  2. On March 23, 2006, x-ray of the right hip.  Impression is      intertrochanteric right hip fracture.  3. On March 23, 2006, a chest x-ray.  Impression is no  acute      abnormality.  Chronic cardiomegaly with scarring at the right base.  4. March 24, 2006, a 2D echocardiogram.  Summary was overall LV      systolic function was normal.  LV ejection fraction  60%.  Trivial      aortic valve regurgitation.  Mild mitral valve regurgitation.  Mild      to moderate tricuspid valve regurgitation.   CONSULTATIONS OBTAINED:  1. Orthopedics, Dr. Chaney Malling.  2. Cardiology from [facility].   HOSPITAL COURSE AND CONDITION OF PATIENT:  For details of the initial  part of the admission, please refer to the history and physical note  done by Dr. Lavera Guise on March 23, 2006.  In summary, [name] is a  pleasant 77 year old female patient with history of 3-vessel coronary  artery disease status post stent, atrial fibrillation, hypertension,  hyperlipidemia,  hypothyroidism, chronic anemia, COPD, who was admitted  status post fall with a right hip fracture.   1. Right hip fracture.  Patient was admitted to the telemetry bed,      considering that she had a significant cardiac history.  A      cardiology consultation was obtained for cardiac clearance.      Cardiology kindly evaluated patient in the hospital and cleared her      for surgery.  Following which, patient had gamma nail inserted in      her right hip.  Patient tolerated the procedure well.  Now she is      tolerating her physical therapy and is to be discharged to a      skilled nursing facility for rehab.  Orthopedics obviously followed      her through the course of her hospital stay.  2. Acute blood loss anemia.  Patient dropped her hemoglobin to 7.5,      and received 2 units of packed red blood cells, following which her      hemoglobin/hematocrit is 9.7/29.2 today, and this has to be closely      followed.  3. Renal insufficiency which probably is chronic kidney disease.  Her      BUN today is 26, and creatinine of 1.37.  The patient was given a      gentleman IV fluid  hydration, and her creatinine dropped from 1.52.      This to be followed up as an outpatient.  4. Thrombocytopenia.  Patient was gradually dropping her platelet      counts to 101 on January 13; however, it has bounced back to 132      today.  Patient is on Lovenox, and this can be followed up as      deemed necessary.  5. Coronary artery disease.  Patient, as indicated above, was      evaluated by [facility] and [facility].  They indicated that the      patient had recent cardiac workup which was negative for ischemia.      Patient denies any chest pain, and there were no features of      congestive heart failure, and she was cleared for surgery, and      since the surgery patient has remained stable.  Patient to continue      on her medications. She had an echo done in the hospital as      indicated above, and she is to follow up with her cardiologist as      an outpatient.  6. For her proximal atrial fibrillation, patient is in sinus rhythm,      and she is to continue her amiodarone.  7. Hypothyroidism, which is stable, and patient is to continue her      Synthroid.  She had a TSH of 2.149.   Patient had some electrolyte abnormalities of hypokalemia and  hypomagnesemia which were repleted and corrected.      Marcellus Scott, MD  Electronically Signed     AH/MEDQ  D:  03/28/2006  T:  03/28/2006  Job:  [id number]   cc:   Tammy R. Collins Scotland, M.D.  Fax: [phone]   Richard A. Alanda Amass, M.D.  Fax: [phone]

## 2010-07-31 NOTE — Cardiovascular Report (Signed)
NAMEDIMOND, CROTTY NO.:  [id number]   MEDICAL RECORD NO.:  [id number]          PATIENT TYPE:  INP   LOCATION:  4703                         FACILITY:  [facility]   PHYSICIAN:  Nanetta Batty, M.D.   DATE OF BIRTH:  03/27/1917   DATE OF PROCEDURE:  DATE OF DISCHARGE:                              CARDIAC CATHETERIZATION   [name] is an 77 year old female with known CAD, status post intervention  10 years ago.  Her other problems include hyperlipidemia and hypertension.  She was admitted by Dani Gobble, M.D., on August 13, 2004, with unstable  angina.  She ruled out for myocardial infarction.  She had no EKG changes.  She presents now for diagnostic coronary arteriography and to rule out an  ischemic etiology.   PROCEDURE DESCRIPTION:  The patient was brought to the second floor [facility] in a post absorptive date.  She was  premedicated with p.o. Valium.  Her right groin was prepped and shaved in  the usual sterile fashion.  One percent Xylocaine was used for local  anesthesia.  A 6 French sheath was inserted into the femoral artery using  standard Seldinger technique.  Then 6 French right and left Judkins  diagnostics along with 6 French pigtail catheter were used for selective  coronary angiography, left ventriculography and distal abdominal  aortography.  Visipaque dye was used for the entirety of the case.  Retrograde aortic and left ventricular pullback pressures were recorded.  The patient did receive intravenous medication for contrast allergy  prophylaxis, as well as a bicarbonate drip for radio contrast nephropathy  prophylaxis.   HEMODYNAMIC DATA:  Aortic systolic pressure 192 and diastolic pressure 84.   Left ventricular systolic pressure 186 and diastolic pressure 26.   SELECTIVE CORONARY ANGIOGRAPHY:  1.  Left main:  The left main was fluoroscopically calcified.  There      appeared to approximately 60% ostial  tapered stenosis.  There was      damping with the 6 French catheter.  A 5 [place] JR4 catheter was then      used and exchanged and there was mild damping of this as well.  2.  LAD:  The LAD had a very fairly focal 50% napkin ring hypodense lesion      at the first septal perforator.  There was an 80% segmental lesion in      the distal third of the vessel.  3.  Left circumflex:  This vessel had an 80% hypodense lesion in its mid      portion in the AV groove on a bend.  4.  Right coronary artery:  A large dominant vessel.  There was a patent      stent in its mid portion.  There was a very focal napkin ring lesion      just proximal to this in the 70-80% range.   LEFT VENTRICULOGRAPHY:  RAO left ventriculogram was performed using 20 mL of  Visipaque dye at 10 mL/sec.  The overall LVEF was estimated at greater than  50% without focal wall  motion abnormalities.   DISTAL ABDOMINAL AORTOGRAPHY:  Distal abdominal aortogram was performed  using 20 mL of Visipaque dye at 20 mL/sec.  There appeared to be a 75%  proximal left renal artery stenosis.  There was a moderate infrarenal  abdominal aortic aneurysm noted.  There was 50-60% ostial right common iliac  artery stenosis with posterior dilatation.   IMPRESSION:  [name] has left main three-vessel disease with normal LV  function.  Optimally the best therapy would be CABG for complete  revascularization, however, given her age and comorbidities, including  chronic renal insufficiency, consideration should be given to culprit  lesion angioplasty.  An ACT was measured and the sheaths  were sewn securely in place.  They will be removed once the ACT falls below  170.  Heparin will then restarted four hours later.  A bicarbonate drip will  be continued per protocol and her renal function will be assessed.  I will  review the films with my colleagues prior to making final determination.  She left the laboratory in stable condition.       JB/MEDQ  D:  08/14/2004  T:  08/16/2004  Job:  [id number]   cc:   Second Floor [facility]   [facility]  [address], [place] [address]   Richard A. Alanda Amass, M.D.  [phone] [address]  [place]  [place] [address]  Fax: [phone]   Tammy R. Collins Scotland, M.D.  [address]  [place]  [place] [address]  Fax: [phone]

## 2010-07-31 NOTE — Op Note (Signed)
[name], [name] NO.:  [id number]   MEDICAL RECORD NO.:  [id number]          PATIENT TYPE:  INP   LOCATION:  3710                         FACILITY:  [facility]   PHYSICIAN:  Iva Boop, MD,FACGDATE OF BIRTH:  12-Mar-1918   DATE OF PROCEDURE:  02/18/2006  DATE OF DISCHARGE:  02/21/2006                               OPERATIVE REPORT   PROCEDURE:  Small bowel capsule endoscopy.   INDICATIONS:  An 77 year old white woman with occult blood loss anemia.  Colonoscopy and EGD have been unrevealing in the past few months.   PROCEDURE DATA:  Height 63 inches.  Weight 121 pounds.  Normal build.  Gastric passage time 16 minutes.  Small bowel passage time 4 hours 26  minutes.   FINDINGS:  1. Mild duodenitis.  2. 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  3. One shallow small intestinal ulcer.   SUMMARY AND RECOMMENDATIONS:  1. The arteriovenous malformations are the likely cause of her anemia.      She needs antiplatelet agents due to coronary artery disease and      drug-eluting stents.  2. Would watch her CBC at least monthly.  Give p.o. iron if possible.      Use IV iron again (she received that during this hospitalization).      If this strategy does not work, some of the AVMs are within reach      of enteroscopy occurring early on in the course of the capsule      endoscopy and ablation is possible, though Plavix would need to be      held.      Iva Boop, MD,FACG  Electronically Signed     CEG/MEDQ  D:  02/27/2006  T:  02/28/2006  Job:  [id number]

## 2010-07-31 NOTE — Discharge Summary (Signed)
NAMEHENDY, [name]                 ACCOUNT NO.:  [id number]   MEDICAL RECORD NO.:  [id number]          PATIENT TYPE:  INP   LOCATION:  3710                         FACILITY:  [facility]   PHYSICIAN:  Richard A. Alanda Amass, M.D.DATE OF BIRTH:  [birth year]   DATE OF ADMISSION:  02/15/2006  DATE OF DISCHARGE:  02/21/2006                               DISCHARGE SUMMARY   DISCHARGE DIAGNOSES:  1. 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  2. Known coronary artery disease with previous Cypher stenting June      2006.  3. Preserved left ventricular function.  4. Peripheral vascular disease with a 70% left renal artery stenosis      and known abdominal aneurysm that is followed by our group.  5. Chronic obstructive pulmonary disease.  6. Treated dyslipidemia.  7. Treated hypothyroidism   HOSPITAL COURSE:  The patient is an 77 year old female well-known to Dr.  Alanda Amass.  She has a history of coronary disease and had circumflex and  LAD Cypher stenting in June 2006.  The patient was seen in the office  February 14, 2006 with weakness and dyspnea.  She has been admitted with  anemia and had a recent upper GI evaluation including esophageal  dilatation.  She never had a colonoscopy.  She was transfused during  that admission in September.  She has been feeling weak and had some  black stools.  She was seen in the office.  CBC was done and her  hemoglobin was 7.1.  She was admitted to the emergency room February 15, 2006 for further evaluation.  Rectal exam on admission revealed dark  stool which was markedly positive.  She was in sinus rhythm, sinus  brady.  She had a creatinine of 1.6 on admission.   The patient was seen in consult by GI service for further evaluation.  She had a colonoscopy was done which revealed some diverticulosis and  hemorrhoids.  Ultimately, she required a capsule endoscopy which  revealed AVMs.  Her aspirin was discontinued throughout this admission.  She was transfused 2 units of packed RBCs.  She did have some PAF during  this admission and her amiodarone was increased to 200 mg b.i.d.  We  feel she can be discharged February 21, 2006.  She will go home on  Plavix but no aspirin.  She will have a followup CBC later this week.  She will need close monitoring of her hemoglobin and hematocrit.  Will  keep her on a double dose of amiodarone for a couple weeks and then cut  her back to once a day.  She is in sinus rhythm, sinus brady at  discharge.   DISCHARGE MEDICATIONS:  1. Protonix 40 mg a day.  2. Synthroid 0.1 mg a day.  3. Plavix 75 mg a day.  4. Lasix and potassium 20 mEq of Lasix and 10 mEq of potassium three      times a week.  5. Amiodarone has been increased 20 mg twice a day for 2 weeks and      then once a day.  6.  Metoprolol 25 mg a day.  7. Pravastatin 80 mg a day.   She will stop her aspirin for now.   LABS:  At discharge, hemoglobin was 12.1, hematocrit 37.4, platelets  191, white count 6.7.  Sodium 138, potassium 3.8, BUN 20, creatinine  1.9, magnesium 1.9.  Abdominal films showed basilar atelectasis, small  left effusion, cardiomegaly, prominent bowel loop in the right abdomen.   DISPOSITION:  The patient discharged in stable condition and will follow  up with Dr. Alanda Amass and Dr. Lina Sar.      Abelino Derrick, P.A.      Richard A. Alanda Amass, M.D.  Electronically Signed    LKK/MEDQ  D:  02/21/2006  T:  02/22/2006  Job:  [id number]   cc:   Hedwig Morton. Juanda Chance, MD  Tammy R. Collins Scotland, M.D.

## 2010-07-31 NOTE — Cardiovascular Report (Signed)
NAMECYERA, [name]                 ACCOUNT NO.:  [id number]   MEDICAL RECORD NO.:  [id number]          PATIENT TYPE:  INP   LOCATION:  6529                         FACILITY:  [facility]   PHYSICIAN:  Richard A. Alanda Amass, M.D.DATE OF BIRTH:  04-13-1917   DATE OF PROCEDURE:  08/17/2004  DATE OF DISCHARGE:                              CARDIAC CATHETERIZATION   PROCEDURE:  Retrograde central aortic catheterization, selective left  coronary angiography pre and post IC nitroglycerin administration, PTCA and  subsequent chromium cobalt stenting midLAD 2.25/18 Guidant mini-vision;  chromium cobalt stent proximal-midcircumflex 3.0/12 mmHg.   PREMEDICATION:  Weight adjusted heparin, Aggrastat bolus plus infusion,  preoperative Plavix 600 milligrams and aspirin. IC Nitroglycerin  administration.   BRIEF HISTORY:  [name] is an 77 year old married mother of four with  seven grandchildren, eight great-grandchildren. She is a nonsmoker, has  known coronary disease and suffered an out of hospital anterior wall  subendocardial myocardial infarction June 15, 1995. She subsequently  underwent elective stenting of the LAD and the on June 16, 1995 and then had  staged midRCA stenting on Jul 26, 1995, both with PS balloon expandable  stents. She has gotten along well. Has been very functional as an outpatient  and had a negative Cardiolite for ischemia September 2004. She was last seen  by me on November 27, 2002. She did well until several weeks prior to  admission when she began having chest pain compatible with recurrent angina.  She was then admitted on August 14, 2004 for unstable angina. There was minimal  elevation of CK and with negative troponin and no acute EKG changes. She was  kept on intravenous nitroglycerin and medical therapy and underwent  diagnostic catheterization by Dr. Allyson Sabal on August 14, 2004. She was found to  have high-grade three-vessel disease with 75-85% mid RCA stenosis proximal  to the previously placed stent which had no significant ISR. An 80-85% focal  stenosis of the proximal circumflex and 90-95% of the mid to distal third of  the LAD segmentally. She also had catheter damping with a 6-French catheter  of the left main and approximately 50% or slightly less narrowing with 3+  proximal calcification. There was high-grade left renal artery stenosis and  aneurysmal dilatation of the infrarenal abdominal aorta with 70-80% right  common iliac artery.   She was hydrated aggressively over up weekend, her creatinine came down from  1.6 to 1.4 as she had recurrent chest pain in the hospital. Her cine  angiograms were reviewed by me after discussion with Dr. Allyson Sabal. I felt that  she was a potential candidate for PCI in the setting of advanced age,  severely calcific vessels, and comorbid disease. She had good LV function  with an EF of approximately 50% and angiographic LVH. We were concerned  about a left main coronary stenosis but we felt that this was probably not  high-grade angiographically. Full discussion with the patient and her family  about the risks and alternatives to therapy including referral for CABG  and/or PCI or fully discussed. They declined options for CABG consideration  and  preferred consideration for PCI. Informed consent was obtained to  proceed with this recognizing the associated risks.   DESCRIPTION OF PROCEDURE:  The patient was brought to the second floor CP  lab in a postabsorptive state. She was given Plavix 600 milligrams p.o. and  was given a bicarbonate drip per pharmacy prior to the procedure. The left  groin is prepped, draped in the usual manner. 1% Xylocaine was used for  local anesthesia and a LCFA was entered with a single anterior puncture  using an 18 thin-wall needle. A 6-French short Daig sidearm sheath was  inserted. Selective left coronary angiography was done in multiple  projections with a 6-French 4 cm taper JL-4 side  hole guiding catheter. ACTs  were monitored throughout the procedure. The patient was given Aggrastat  bolus plus infusion along with 2500 units of heparin.   The left coronary was intubated with a 6-French JL-4 side-hole guiding  catheter and Asahi 0.014 inches soft wire was used to negotiate the calcific  proximal and tortuous LAD. The guidewire was free in the distal third of the  LAD. It was felt because of the proximal calcification and tortuosity that  we would not be able to negotiate the LAD and get to the lesion with a  standard DES stent. The lesion was first dilated with a 2.0/15 Maverick  Scimed balloon at 8/32. The balloon was then exchanged for a chromium cobalt  Guidant Mini-Vision 2.25/18 stent. We were able to deliver this across the  stenosis in the mid - distal third of the LAD beginning just after a small  middiagonal branch. The lesion was covered, deployed at 9-26 and post  dilated 12 atmospheres for 31 seconds. IC nitroglycerin 200 mcg was  administered. Injection showed stenosis reduction from 95% to 0. The small  diagonal branch had a 70-80% ostial stenosis but was intact with antegrade  flow and there was good flow of the LAD to the apex TIMI III. The guide wire  was then pulled back and used to negotiate the circumflex artery and we were  able to cross the proximal circumflex artery down to the mid position into  the OM branch. Initial attempts at passing a 3.0/12 TAXUS DES stent were  unsuccessful and we could not get through the calcified left main area so  this was abandoned. We then switched to a 3.0/12 chromium cobalt ML vision  Guidant stent and were able to cross into the circumflex artery and cover  the 85% proximal - mid stenosis. The stent was deployed at 10/33 and post  dilated 12/26. Stenosis was reduced from 85% to 0 with good TIMI III flow to  the tortuous OM II, tortuous OM III, and large distal circumflex and atrial  branch.  The small OM1 had  an ostial and proximal 80% stenosis and there was 50%  segmental stenosis of the proximal third of the circumflex which had 2 to 3+  calcification.   The main left coronary appeared to have less than 50% stenosis with good  flow on flush injections, although there was catheter damping with the 6-  [place] nonside hole guide.   The proximal LAD had 40% in-stent restenosis with good residual lumen. There  was another 50-60% concentric stenosis of the LAD at the proximal third  before the large twin diagonal that we did not treat at this time. The large  twin diagonal was widely patent and the treated LAD lesion was beyond this.   The patient  has had successful chromium cobalt stenting of the mid - distal  third of the LAD and the proximal - midportion of the circumflex. She also  has residual high-grade proximal RCA stenosis beyond the acute and proximal  to the previously placed stent which we will consider intervening on in a  staged PCI fashion pending the patient's clinical status and renal function.   CATHETER DIAGNOSES:  1.  Unstable anginal pectoris without myocardial infarction.  2.  Successful chromium cobalt stenting mid-left anterior descending artery      stenosis.  3.  Successful chromium cobalt stenting of the proximal circumflex stenosis.  4.  Residual proximal/midright coronary artery stenosis with staged      percutaneous coronary intervention.  5.  Remote anterior wall subendocardial myocardial infarction out of      hospital with left anterior descending artery stenting June 16, 1995;      staged midright coronary artery stenting Jul 26, 1995. No restenosis on      diagnostic study of August 14, 2004.  6.  Well-preserved left ventricular function.  7.  Hypothyroidism on therapy, elevated thyroid-stimulating hormones,      medicine adjustment pending.  8.  High-grade left renal artery stenosis segmental systemic associated      renovascular hypertension.  9.   Gastroesophageal reflux disease.  10. Bilateral carotid bruits.       RAW/MEDQ  D:  08/17/2004  T:  08/17/2004  Job:  [id number]   cc:   Tammy R. Collins Scotland, M.D.  [address]  [place]  [place] [address]  Fax: [phone]   Nanetta Batty, M.D.  Fax: [phone]   Patient's chart   Record room   CP Lab

## 2010-07-31 NOTE — Assessment & Plan Note (Signed)
[name]                           GASTROENTEROLOGY OFFICE NOTE   [name], [name]                        MRN:          [id number]  DATE:11/24/2005                            DOB:          Apr 06, 1917    [name] is a very nice 77 year old white female who is here at Dr.  Kandis Cocking recommendation for evaluation of dysphagia.  For the past year  or so she has had problems with food, mostly with solid food but  occasionally with liquids as well.  She denies any significant problems  swallowing pills.  She has not had any regurgitation or vomiting but  sometimes food hesitates and patient has to wait a while until it passes  before she can continue to eat.  She denies odynophagia.  There has been no  weight loss and no hematemesis.  There is no pre-existing history of hiatal  hernia.  Patient has never had a GI evaluation.   PAST MEDICAL HISTORY:  1. Atherosclerotic coronary artery disease.  2. Status post myocardial infarction in 1997.  3. Percutaneous interventions.  4. History of congestive heart failure.  5. Chronic obstructive pulmonary disease.  6. Nonsustained ventricular tachycardia.  7. Hypothyroidism and a goiter.   MEDICATIONS:  1. Metoprolol 50 mg b.i.d.  2. Lisinopril 10 mg h.s.  3. Calor 20 mEq daily.  4. Lasix which has been increased to 40 mg between Monday-Friday.  5. Aspirin 81 mg a day.  6. Plavix 75 mg a day.  7. Isosorbide 30 mg daily.  8. Pravastatin 80 mg p.o. daily.   FAMILY HISTORY:  Negative for colon cancer.  Breast cancer in one sister.   SOCIAL HISTORY:  Married, four children, retired from [employer].  She does  not smoke and does not drink alcohol.   REVIEW OF SYSTEMS:  Positive for swelling of her feet which has resolved and  Lasix has been increased.  Sleeping problems.  Back pain.   PHYSICAL EXAMINATION:  GENERAL APPEARANCE:  She was hard of hearing, alert  and oriented. She had permanent kyphosis  of the thoracic spine.  VITAL SIGNS:  Blood pressure 102/50, pulse 52 and weight 130 pounds.  HEENT:  Oral cavity was normal.  NECK:  Thickening and fat pad anteriorly.  I was not sure whether this was a  goiter or just a thick neck.  There was a bruit in the right carotid, no  bruit in the left carotid.  LUNGS:  Normal breath sounds.  CARDIOVASCULAR:  Normal S1, normal S2.  ABDOMEN:  Soft and nontender, normal active bowel sounds.  RECTAL:  Exam not done.  EXTREMITIES:  Trace edema.   IMPRESSION:  1. An 77 year old white female with predominantly solid food dysphagia      suggestive of esophageal stricture with some element of liquid      dysphagia as well raising a possibility of presbyesophagus or      esophageal dysmotility.  There is apparently history of goiter and I am      not sure whether today's exam shows only thick neck or whether it  actually has a goiter which could be possibly obstructing her      esophagus.  She points to the lower chest where the food gets stuck      which is more consistent with esophageal stricture but is less likely a      possibility of esophageal cancer since there has been really no weight      loss and no odynophagia but this needs to be ruled out as well at her      age group.  2. Coronary artery disease.  Patient on Plavix and aspirin.  Dr. Alanda Amass      recommended stopping the Plavix before endoscopic procedure with      continuation of the aspirin.   PLAN:  1. I have discussed the work-up with the patient.  I think the most      efficient way of finding out would be to proceed with upper endoscopy,      possible esophageal dilation.  She is now on Protonix 40 mg a day and I      recommended for her to continue on it.  Depending on the findings of      the endoscopy, she may have to be put on some promotility agent if      necessary.  Until then, I advised her to chew well and eat slowly,      always with upright position.  2. I  have asked her to decrease her Lasix back to 20 mg a day between      Monday and Friday because of weakness and orthostatic symptoms related      to the increase in the Lasix.  Her daughter will increase Lasix on a      p.r.n. basis if the feet start to swell again.                                   Hedwig Morton. Juanda Chance, MD   DMB/MedQ  DD:  11/24/2005  DT:  11/25/2005  Job #:  [id number]   cc:   Tammy R. Collins Scotland, M.D.

## 2010-07-31 NOTE — Cardiovascular Report (Signed)
Brandy, [name]                 ACCOUNT NO.:  [id number]   MEDICAL RECORD NO.:  [id number]          PATIENT TYPE:  INP   LOCATION:  6532                         FACILITY:  [facility]   PHYSICIAN:  Richard A. Alanda Amass, M.D.DATE OF BIRTH:  1917-10-18   DATE OF PROCEDURE:  08/20/2004  DATE OF DISCHARGE:                              CARDIAC CATHETERIZATION   PROCEDURE:  Retrograde central aortic catheterization, coronary angiography  by Judkins technique, pre and post IC nitroglycerin administration, PCI with  predilatation, PTCA followed by DES 3.0/13 Cipher stenting, high grade mid  right coronary artery stenosis, DES direct stent Cipher 2.5/8 proximal left  anterior descending stenosis, right common iliac balloon expandable iliac  stent 8 mm with [id number] iliac stent, Aggrastat bolus plus infusion, continued  aspirin plus Plavix, bicarbonate drip preoperatively, weight adjusted  heparin 3000 units.   Please refer to detail catheterization report of August 17, 2004.    The patient was brought to the second floor CP lab in a post absorptive  state after 5 mg of Valium p.o. premedication. She was given 3 mg of Nubain  in divided doses IV for sedation in the lab. The right groin was prepped,  draped in the usual manner. A 1% Xylocaine was used for local anesthesia.  The patient was continued on Mucomyst and given preoperative bicarbonate  drip because of advanced age with renal insufficiency and well-preserved LV  function. BUN and creatinine preoperatively was 9/1.5.   This 77 year old married grandmother has a long history of coronary artery  disease and was admitted with unstable angina on August 14, 2004 and underwent  catheterization by Dr. Nanetta Batty on August 14, 2004.  She was found to  have significant three-vessel coronary disease and borderline left main  disease. She had well-preserved LV function and infrarenal abdominal aortic  aneurysm, size to be determined, asymptomatic,  75% left renal artery  stenosis and right common iliac stenosis. Because of advanced age, comorbid  illnesses, she was not felt to be a surgical candidate.  Options, procedure  risks were fully explained to the patient and family and on August 17, 2004  and they preferred to try PCI and the stenting. The patient has had remote  non DES stent placed to the mid RCA in the setting of out of hospital AWMI.  June 16, 1995.  She then had a staged mid RCA non DES stent on Jul 26, 1995.  Catheterization on August 14, 2004 showed no restenosis, however, she  had, her new lesions occluded high-grade 95% segmental mid LAD beyond the  diagonal and beyond the large twin diagonal, borderline 70-80% concentric  lesion at SP1, the junction of the proximal third of the LAD, 85% stenosis  at the bend in the codominant circumflex artery and high-grade 85% eccentric  mid RCA stenosis proximal to the previously placed stent with a good distal  vessel.   On August 17, 2004, she underwent culprit lesion intervention with chromium  cobalt stent to the mid-LAD with a mini Vision 2.25/18 and high-pressure  inflation with good result and no side branch DX  occlusion.  She underwent  proximal circumflex stenting as well with a 3.0 12 mini Vision stent with  good angiographic result. She was treated medically, hydrated  postoperatively, remained, had transient hypotension with sheath pulling  after the first procedure, but was otherwise stable. Creatinine remained  stable and she is brought back now for staged intervention.   The RCFA was entered with a single anterior puncture using an 18 thin-wall  needle and a 6-French short Daig sidearm sheath was inserted. A Wooly wire  was used to negotiate the high-grade right common iliac stenosis. Right  coronary angiography was done with a 6-French 4 cm taper Cordis coronary  guide. The patient was on a bicarb drip. She had been continued on aspirin  and Plavix. She was given  3000 units of weight-adjusted heparin and  Aggrastat single bolus plus infusion was begun. ACT's were monitored  throughout the procedure. No further heparin was administered because of  ACT's in the 300 range. The right coronary was intubated with a JR-4 Cordis  side hole guiding catheter. There was a shepherd's crook proximal right that  was negotiated with a 0.014 inch Asahi soft wire which was free in the  distal RCA. The RCA mid was then dilated with a 2.5/15 Guidant Voyager  balloon at 08-31. The balloon was pulled back. It was clear that there was  pseudo dissection throughout the proximal third of the RCA because of  straightening from the guide wire and this is similar to her remote  procedure.   We then, using exchange technique, passed a 3.0/13 Cipher stent across the  stenosis, positioned fluoroscopically and deployed a 12-21 pump.  Post  dilated at 16 - 32. The balloon was pulled back. The guide was pulled back  partially to at one take the straightening out of the proximal artery and  relieve the pseudo dissection. The stenosis was reduced from 85 - 90% focal  and segmental narrowing to 0% with good TIMI III flow to the distal vessel.  There was some minor narrowing in the proximal third and beyond the old mid  RCA stent but excellent residual lumen. The pseudo dissection was resolved.   The left coronary was then intubated with a JL-4, 6-French side hole Cordis  guiding catheter. Selective left coronary angiography pre and post IC nitro  demonstrated widely patent stent beyond the mid-LAD diagonal branch with  good flow. There was a 75-80% concentric stenosis at the level of the first  septal perforator beyond the previously placed proximal LAD stent before the  twin diagonal. This was crossed with 0.014 inch Asahi guide wire and  directly stented with a 2.5/8 Cipher stent which was positioned fluoroscopically, deployed at 13 - 32 and post dilated at 14 - 34. The  balloon  was pulled back and final injection showed excellent result with  stenosis reduction of 75-80% to 0 with good flow. The left main coronary  artery still had residual concentric stenosis but good flow but there was  catheter damping with the 6-French guide mildly.   It was elected in the previous procedure and now not to approach the left  main coronary stenosis as this would involve unprotected left main PCI. We  felt that medical therapy was indicated with patient continued aspirin and  antiplatelet therapy and associated medical therapy. If the patient has  recurrent angina or progression of her main left coronary stenosis she might  be candidate for subsequent PCI in this setting with her advanced  age and  risk factors as outlined above. She has been very functional and ambulatory  at home. She had no chest pain or arrhythmia with PCI and tolerated the  procedure well.   We then did iliac angiography with a pigtail catheter and through the right  common iliac sheath by hand injections to demonstrate the high-grade 85%  calcific right common iliac stenosis. We felt that we should fix this to  help preserve access in this setting.   The sheath was changed to a long 6-French side-arm sheath. The iliac lesion  was then stented with a hand crimped Cordis genesis [id number] iliac stent  crimped on a 7 mm x2 cm Cordis Power balloon. This was positioned  fluoroscopically precisely at the origin of the RCIA but it is the only  minimally into the post stenotic aneurysmal area. The stent was deployed at  10-31 and post dilated to 14 - 31. The balloon was then exchanged for an  upgraded 8 mm x 2 cm Cordis Powerflex balloon.  The stent was then dilated  to 10 - 41. This showed on hand injection with balloon removal excellent  angiographic result with stenosis reduction to 0.  No significant dissection  and good flow. There was only a small portion of the distal stent superiorly  that was not abutted  to the post stenotic area. The sheath was pulled back  and the sheath was changed from a long to a short 6-French side-arm sheath.  The patient was transferred to the holding area for ACT monitoring and will  get sheath removal with pressure hemostasis. She tolerated the coronary and  peripheral procedure well.   CATHETERIZATION DIAGNOSES:  1.  Successful staged PCI with mid right coronary DES stenting post      predilatation 3.0/13 Cipher.  2.  Staged high-grade proximal - mid- LAD direct focal stenting DES 2.5 x8      Cipher stent.  3.  Widely patent mid LAD stent and widely patent proximal circumflex stent      from August 17, 2004.  4.  Residual left main coronary stenosis approximately 50% with good flow.  5.  Known 70% left renal artery stenosis.  6.  Asymptomatic infrarenal abdominal aortic aneurysm.  7.  Remote out of hospital AWMI treated with LAD stenting June 16, 1995      widely patent long-term. 8.  Staged mid right coronary stent Jul 26, 1995 treated with non DES stent,      widely patent on recent study.  9.  Well-preserved LV function.       RAW/MEDQ  D:  08/20/2004  T:  08/20/2004  Job:  [id number]   cc:   Tammy R. Collins Scotland, M.D.  [address]  [place]  [place] [address]  Fax: [phone]   [facility]   Nanetta Batty, M.D.  Fax: [phone]

## 2010-07-31 NOTE — Discharge Summary (Signed)
[name], [name] NO.:  [id number]   MEDICAL RECORD NO.:  [id number]          PATIENT TYPE:  INP   LOCATION:  2036                         FACILITY:  [facility]   PHYSICIAN:  Richard A. Alanda Amass, M.D.DATE OF BIRTH:  [birth year]   DATE OF ADMISSION:  12/01/2005  DATE OF DISCHARGE:  12/08/2005                                 DISCHARGE SUMMARY   DISCHARGE DIAGNOSES:  1. Atrial fibrillation, sinus rhythm at discharge.  2. Esophagitis by endoscopy this admission with anemia, stable at      discharge; she will need to be on lifelong proton pump inhibitor.  3. Coronary disease, three-vessel percutaneous coronary intervention, June      2006.  4. Normal left ventricular function.  5. Peripheral vascular disease with prior right iliac artery percutaneous      transluminal angioplasty, June 2006, and residual carotid and renal      artery stenosis.  6. Hypertension.  7. Dyslipidemia.  8. Treated hypothyroid.   HOSPITAL COURSE:  The patient is a 77 year old female followed by Dr.  Alanda Amass.  She had a remote PCI in 1997.  In June 2006, she was admitted  with unstable angina and had catheterization.  She had severe three-vessel  disease.  She was not felt to be a good bypass candidate and she underwent a  staged intervention to her LAD, circumflex and RCA.  Hospital course at that  time was complicated by hematoma and transfusions.  She also had a right  iliac done August 20, 2004.  Dr. Alanda Amass had seen her recently in the office  and referred her to Dr. Lina Sar for dysphagia evaluation.  She came into  the office as a walk-in on the 17th with weakness.  She was in sinus rhythm  and sinus bradycardia and we cut back on her beta blocker.  She then  presented to the emergency room, December 01, 2005, with chest pain and  weakness and was found to be in atrial fibrillation with a ventricular  response of about 115.  She was admitted to Telemetry and started on IV  heparin and amiodarone.  She was seen in consult by the GI service.  She was  hypotensive on admission and we stopped her ACE inhibitor.  She was watched  over the weekend and underwent endoscopy prior to discharge.  This revealed  a hiatal hernia and esophagitis.  She is in sinus rhythm now.  It was  decided after discussion with the patient and family that she would be at  risk for GI bleeding on Coumadin.  Plan is for aspirin and Plavix and PPI.  In the future, if she remained stable on aspirin and Plavix with no  recurrent GI bleeding, Dr. Alanda Amass may want to consider Coumadin therapy.  The patient can be discharged on December 08, 2005.  Her hemoglobin at  discharge is 10.2.   DISCHARGE MEDICATIONS:  1. Plavix 75 mg a day.  2. Aspirin 81 mg a day.  3. Metoprolol 25 mg twice a day.  4. Protonix 40 mg a day.  5. Amiodarone 200  mg a day.  6. Lasix 20 mg a day.  7. Potassium 10 mEq a day.  8. Synthroid 0.1 mg a day.  9. Pravastatin 80 mg a day.   LABORATORY DATA:  White count 6.0, hemoglobin 10.2, hematocrit 30.5,  platelets 138, 000.  Her hemoglobin dropped as low as 7.5.  Sodium 143,  potassium 4.5, BUN 23, creatinine 1.9.  LFTs are essentially normal.  Troponins were negative.  CKs was elevated 951 with negative MB.  Stools  were positive.  BNP was 751 on the 21st.  Iron level was 44, B12 1290,  ferritin 15, folate 9.5.  TSH 1.05.   Chest x-ray on admission showed left upper extremity PICC line.   INR at admission was 1.0.   DISPOSITION:  The patient is discharged in stable condition and will follow  up Dr. Alanda Amass in a couple of weeks in the office.  We did stop her  Lotensin.  For some reason the family seems to think this was the cause of  all her problems.  We will leave her off this for now and probably not  restarted it in the future.      Abelino Derrick, P.A.      Richard A. Alanda Amass, M.D.  Electronically Signed    LKK/MEDQ  D:  12/08/2005  T:   12/10/2005  Job:  [id number]   cc:   Tammy R. Collins Scotland, M.D.  Hedwig Morton. Juanda Chance, MD

## 2010-07-31 NOTE — Assessment & Plan Note (Signed)
[facility]                           GASTROENTEROLOGY OFFICE NOTE   [name], [name]                        MRN:          [id number]  DATE:02/01/2006                            DOB:          [birth year]    [name] is an 77 year old white female, whom we saw in September of this  year for dysphagia to liquids, pills and solids.  She was hospitalized at  [facility] briefly for atrial fibrillation, and underwent upper endoscopy  by Dr. Russella Dar on December 07, 2005, with findings of presbyesophagus.  There  was a small hiatal hernia, reflux esophagitis, but no esophageal stricture,  17 mm dilator passed through the esophagus.  The patient had improvement of  her swallowing for about a week, but the dysphagia has recurred, mostly to  liquids.  She denies any coughing or choking or hoarseness.  Her daughter,  who came with her, says that she has troubles mostly with liquids, no  odynophagia.   MEDICATIONS:  1. Plavix.  2. Aspirin.  3. Metoprolol.  4. Protonix 40 mg a day.  5. Synthroid.  6. Amiodarone.   We have discussed esophageal dysmotility with the patient and her daughter.  She initially thought that she needed to have another esophageal dilatation,  but when I explained to her that we are dealing most likely with esophageal  dysmotility she seemed to understand.   PLAN:  1. Barium swallow with cine-esophagram to assess the presbyesophagus.  If      there is a stricture or spasm we may attempt esophageal dilatation, but      otherwise the endoscopy would not be helpful.  2. She may eventually need a speech pathology evaluation and dietary      evaluation as well as far as liquids.  Of concern, she may need to use      Thick-It to change the clear liquids to nectar-like liquids.  This      decision will be made based on her barium swallow.     Hedwig Morton. Juanda Chance, MD  Electronically Signed    DMB/MedQ  DD: 02/01/2006  DT: 02/02/2006   Job #: [id number]   cc:   Tammy R. Collins Scotland, M.D.  Thereasa Solo. Little, M.D.

## 2010-07-31 NOTE — Assessment & Plan Note (Signed)
[facility]                         GASTROENTEROLOGY OFFICE NOTE   LUDENE, STOKKE                        MRN:          [id number]  DATE:02/13/2006                            DOB:          [birth year]    Ms. [name] is a very nice 77 year old white female who we recently  evaluated for dysphagia. She was initially endoscoped by Dr. Russella Dar in  September 2007 after complaining of food sticking in her esophagus, it  was mostly to solids but also to some degree to liquids. On upper  endoscopy on December 07, 2005, no esophageal stricture was found. She  had a small 3 cm hiatal hernia. A 17 mm Savary dilator passed easily  through the esophagus. The patient reported some improvement of her  symptoms temporarily but when I saw her in the office on February 01, 2006 she again felt like her esophagus had to be stretched that she had  difficulty swallowing even with liquids. At that point, we suspected  presbyesophagus and ordered a barium swallow study. The study was done  on February 07, 2006 which showed completely normal esophageal motility  without evidence of tertiary contractions. There was no esophageal  stricture as well. There was moderate gastroesophageal reflux with water  siphon test. A 13 mm barium tablet passed through the esophagus without  difficulty.   I have called Ms. Theiss at home to discuss results of her x-ray. She  still reports that food gets stuck but it appears that it may be related  more likely to early satiety and gastric retention than actually  dysphagia. Her complaints today were early satiety, having to eat 5  small meals a day, and also some exertional dyspnea. She and her  daughter went square dancing and she had to stop because of shortness of  breath. It is noted in her past history that she has COPD as well as  coronary artery disease status post a myocardial infarction in 1997 with  percutaneous coronary intervention.  She also has a history of CHF and  ventricular tachycardia.   I have discussed the findings of the x-ray with the patient. I feel that  in order to evaluate this further, she would have likely a gastric  emptying scan. At her age of 4, we would have to be careful in using  promotility agent such as Reglan if she is found to have gastroparesis  because of CNS side effects in elderly people. She would do best just  eating small meals at frequent intervals. I will be happy to see her in  our office to followup on her symptomatology as long as she is able to  maintain her weight which as been quite stable around 132 pounds. I feel  that she is getting good nutritional intake.     Hedwig Morton. Juanda Chance, MD  Electronically Signed    DMB/MedQ  DD: 02/13/2006  DT: 02/14/2006  Job #: [id number]   cc:   Gerlene Burdock A. Alanda Amass, M.D.  Tammy R. Collins Scotland, M.D.

## 2010-07-31 NOTE — Consult Note (Signed)
NAMECRESCENT, GOTHAM NO.:  [id number]   MEDICAL RECORD NO.:  [id number]          PATIENT TYPE:  INP   LOCATION:  0108                         FACILITY:  [facility]   PHYSICIAN:  Doralee Albino. Carola Frost, M.D. DATE OF BIRTH:  1917/07/24   DATE OF CONSULTATION:  03/23/2005  DATE OF DISCHARGE:                                 CONSULTATION   REQUESTING PHYSICIAN:  Donnetta Hutching, MD   REASON FOR CONSULTATION:  Right hip pain and a lower extremity  deformity.   BRIEF HISTORY OF PRESENTATION:  Ms. [name] an 77 year old female who  was trying to sit down when she was mistaken about the placement of the  chair; and sustained immediate onset right hip pain without any other  complaint.  She denied any antecedent dizziness or confusion or other  injury.   PAST MEDICAL AND HISTORY:  1. Notable for significant vascular disease.  She has coronary disease      status post multiple stenting procedures, the most recent in June      of 2007 which was 3 vessels as well as a renal artery.  The patient      has had two MIs in the past.  I did not see any ejection fraction      listed.  2. COPD.  3. Dyslipidemia.  4. Hypothyroidism.  5. Recent bleeding which was felt secondary to Plavix and aspirin; and      after this admission, 3-4 weeks ago she had her aspirin      discontinued.   CURRENT MEDICATIONS:  Protonix, Synthroid, Plavix, Lasix, amiodarone,  metoprolol and pravastatin.   REVIEW OF SYSTEMS:  Notable for a history of esophagitis by endoscopy as  well as atrial fibrillation resulting in an admission in September 2007;  and she was discharged in sinus rhythm.   PRIMARY CARE PHYSICIAN:  Tammy R. Collins Scotland, M.D.   CARDIOLOGIST:  Richard A. Alanda Amass, M.D. from [place].   SOCIAL HISTORY:  The patient is accompanied by a her three daughters  Rhae Hammock, and [place].   FAMILY HISTORY:  Not contributory.   REVIEW OF SYSTEMS:  Also notable for decreased hearing, glasses, and  the  history of calcified lung nodules on serial chest x-rays which have been  unchanged.   PHYSICAL EXAMINATION:  GENERAL:  Ms. Armstead appears quite pleasant.  She  is conversant and alert and appropriate for her stated age.  She has no  tenderness, ecchymosis, blocked emotion, or diminished strength  involving her shoulders, elbows, wrists and hands bilaterally.  She has  no sensory or motor disturbances of her upper extremities as well.  CHEST:  Notable for a regular rhythm without significant abnormality  involving the airways.  PELVIS:  Nontender to palpation.  EXTREMITIES:  Her right hip is tender.  There is no break in the skin.  No ecchymosis.  No visible bleeding.  The knee is nontender as is the  ankle.  Strength testing was not possible given her pain in the hip.  She was holding the right lower extremity in an externally rotated and  flexed manner.  The left lower extremity had no hip, knee, or ankle  tenderness with full range of motion.  No pain with axial loading.  No  block to motion, again, and no diminished strength.  Deep peroneal,  superficial peroneal, and tibial nerve sensory motor function was  present bilaterally.  Dorsalis pedis and posterior tib pulses were  easily palpable bilaterally.   LABORATORY STUDIES:  Reviewed, H&H was 12 and 37, platelet count 162.  Her creatinine was 1.54, potassium of 4.7.  Her chest x-ray was notable  for visible coronary artery disease.  AP pelvis x-ray demonstrated a 4-  part intertrochanteric fracture.  No knee films were obtained.   ASSESSMENT:  1. A 4-part intertrochanteric fracture.  2. Severe vascular disease, Plavix.  3. No history of a ejection fraction recently that was readily      discoverable   PLAN:  The patient has been seen and evaluated by medicine who is going  to admit the patient.  They have recommended  cardiac consultation; and this will be performed by Dr. Alanda Amass or one  of his partners in the  morning.  We anticipate surgery when the patient  is stable.  She is at extreme risk for complications, particularly heart  attack, stroke, and bleeding.  I have discussed these risks with the  patient and her family.  Additional risks include DVT and PE.  The  patient and her family have requested Dr. Erasmo Leventhal; and I will  discuss this further with him in the morning to determine a final plan  for taking care of this very pleasant patient.      Doralee Albino. Carola Frost, M.D.  Electronically Signed     MHH/MEDQ  D:  03/24/2006  T:  03/24/2006  Job:  [id number]

## 2010-07-31 NOTE — H&P (Signed)
[name], [name] NO.:  [id number]   MEDICAL RECORD NO.:  [id number]          PATIENT TYPE:  EMS   LOCATION:  MAJO                         FACILITY:  [facility]   PHYSICIAN:  Richard A. Alanda Amass, M.D.DATE OF BIRTH:  11-17-1917   DATE OF ADMISSION:  12/01/2005  DATE OF DISCHARGE:                                HISTORY & PHYSICAL   CHIEF COMPLAINT:  Chest pain.   HISTORY OF PRESENT ILLNESS:  The patient is an 77 year old female who is  followed by Dr. Alanda Amass for coronary disease.  She has had a remote  intervention in 1997.  In June 2006, she was admitted with unstable angina.  On catheterization, she had three-vessel disease.  She was not felt to be a  great candidate for bypass surgery.  The patient and family opted for  multivessel PCI.  On 08/17/2004, she underwent intervention to the LAD and  circumflex followed by staged two-site intervention to the RCA on  08/20/2004.  Her hospital course was complicated by hematoma which required  transfusion.  Her LV function was normal.  She does have residual peripheral  vascular disease and had a right iliac PTA done on 08/20/2004 also.  There  was a residual 75% left renal artery stenosis noted.   The patient was recently referred to Dr. Sandria Manly?  office for dysphagia.  Her  daughters say that she has not felt well.  She was seen as a walk-in at  about 5:00 p.m. on 11/29/2005.  Her heart rate at that time was 55.  Rectal  exam then revealed heme-negative stool.  We cut back on her beta blocker.  She had had some episodes of palpitations.  Today, she had an episode of  exertional chest pain which she has not had for some time.  Symptoms were  relieved with nitroglycerin x2.  It was associated with some shortness of  breath.  Her daughter brought her to the emergency room.  EKG in the  emergency room shows her to be in atrial fibrillation with ventricular  response of about 115.   PAST MEDICAL HISTORY:  1.  Hypertension.  2. Hyperlipidemia.  3. Hypothyroidism.  4. Bilateral carotid bruits.  5. Chronic renal insufficiency with last creatinine 1.6.  6. There is no documented PAF.   CURRENT MEDICATIONS:  1. Synthroid 0.1 mg daily.  2. Lasix 20 mg Monday, Wednesday and Friday.  3. Potassium 10 mEq Monday, Wednesday and Friday.  4. Lisinopril 10 mg daily.  5. Prevacid 80 mg daily.  6. Metoprolol was recently cut back to 25 mg b.i.d.  7. Imdur 30 mg daily.  8. Protonix 40 mg daily.  9. Plavix daily.  10.Aspirin 81 mg daily.   ALLERGIES:  1. IODINE.  2. NUBAIN.   SOCIAL HISTORY:  She lives with her husband.  She is a nonsmoker.  Her  daughter lives nearby.   REVIEW OF SYSTEMS:  She is hard of hearing.  She wears glasses.  She has a  history of an abnormal chest x-ray and has calcified nodules which were seen  in June 2006,  and again in July 2007 these were unchanged.   PHYSICAL EXAMINATION:  VITAL SIGNS:  Blood pressure 92/50, pulse 115-120,  respirations 12.  GENERAL:  She is an elderly female in no acute distress.  HEENT:  Normocephalic.  She does wear glasses.  NECK:  Without JVD.  She has bilateral carotid bruits, right greater than  left.  CHEST:  Reveals kyphosis with clear lung fields.  CARDIAC:  Reveals irregularly irregular rhythm with increased ventricular  response.  ABDOMEN:  Nontender.  No hepatosplenomegaly.  EXTREMITIES:  Without edema.  Pulses are 2+/4.  NEUROLOGIC:  Exam is grossly intact.  RECTAL:  On 11/29/2005 at the office, exam revealed heme negative brown  stool.   IMPRESSIONS:  1. Atrial fibrillation with sick sinus component.  2. Unstable angina, rule out myocardial infarction.  3. Known coronary disease with right carotid artery PCI in 1997 and three      vessel staged PCI in June 2006.  4. Normal left ventricular function.  5. Peripheral vascular disease with carotid disease, renal artery disease      and right iliac PTA in June 2006.  6. History  of hypertension, now hypotensive.  7. Treated dyslipidemia.  8. Gastroesophageal reflux and dysphagia.  The patient is to have an      endoscopy in a couple weeks as an outpatient.  9. Treated hypothyroidism.   PLAN:  The patient will be admitted to telemetry.  We will hold her ACE  inhibitor.  We will start IV heparin and add Lanoxin for rate control  because she is hypotensive.  She may need Coumadin if she rules out for an  MI.      Abelino Derrick, P.A.      Richard A. Alanda Amass, M.D.  Electronically Signed    LKK/MEDQ  D:  12/01/2005  T:  12/03/2005  Job:  [id number]

## 2010-07-31 NOTE — Discharge Summary (Signed)
[name], [name]                 ACCOUNT NO.:  [id number]   MEDICAL RECORD NO.:  [id number]          PATIENT TYPE:  INP   LOCATION:  6532                         FACILITY:  [facility]   PHYSICIAN:  Richard A. Alanda Amass, M.D.DATE OF BIRTH:  May 31, 1917   DATE OF ADMISSION:  08/13/2004  DATE OF DISCHARGE:  08/22/2004                                 DISCHARGE SUMMARY   DISCHARGE DIAGNOSES:  1.  Unstable angina, staged multisite intervention this admission.  2.  Preserved left ventricular function.  3.  Transient renal insufficiency.  4.  Anemia secondary to groin bleed, transfused this admission.  5.  Abnormal chest x-ray, chest computed tomography scan is pending at the      time of this dictation.  6.  Remote intervention 10 years ago by Dr. Alanda Amass.  7.  Treated hypertension.  8.  Dyslipidemia.   HOSPITAL COURSE:  The patient is an 77 year old female who had a remote  intervention by Dr. Alanda Amass 10 years ago.  She presented August 14, 2004, at  12:30 with unstable angina.  She was admitted by Dr. Domingo Sep.  The patient  was put on IV Heparin and nitrates.  Her initial creatinine was elevated.  She did have some recurrent chest pain when her nitroglycerine was weaned  off and it was decided to proceed with angioplasty.  This was done late on  June 2.  This was done by Dr. Allyson Sabal.  She had 80% RCA proximal to a  previously-placed stent.  She also had an 80% circumflex lesion and 80% mid  LAD lesion, a 75% left renal artery stenosis and 60-70% right iliac  stenosis.  The left main was narrowed 50-60%.  Plan was to hydrate her and  maximize her medical therapy with plans for elective staged intervention.  The patient was seen by Dr. Alanda Amass on August 17, 2004, and risks and  benefits of PCI versus bypass surgery were discussed with the family.  They  elected to proceed with PCI.  This was done August 17, 2004, with an LAD stent  and circumflex stent placed.  It was a multi-length Vision  stent.  Plan was  for staged intervention to the RCA.  The patient did develop a hematoma in  her groin and had to be transfused.  She tolerated this well.  Her renal  function remained stable.  She was taken back to the catheterization lab on  August 20, 2004, and underwent 2-site RCA intervention.  She tolerated this  well.  She also had her right iliac dilated by Dr. Alanda Amass at that time.  On August 21, 2004, she is doing well.  We did transfuse her one more unit on  June 8.  Her hemoglobin on June 9 is 11.2.  She did have an abnormal PA and  lateral chest x-ray on admission with a question of a right apical nodule  and a CT scan without contrast was obtained and pending.  The plan will be  to mobilize her today, August 21, 2004, and possibly send her home tomorrow,  August 22, 2004.   DISCHARGE  MEDICATIONS:  1.  Metoprolol 50 mg b.i.d.  2.  Coated aspirin daily.  3.  Pravachol 40 mg a day.  4.  Plavix 75 mg a day.  5.  Her Synthroid has been increased to 0.1 mg a day, her TSH was slightly      elevated this admission.  6.  She should also take Prilosec or Pepcid A.C. and was given a      prescription for nitroglycerin sublingual p.r.n.   LABORATORY DATA:  On June 9, her white count is 9.0, hemoglobin 11.2,  hematocrit 32.4, platelets 121, sodium 141, potassium 4.5, BUN 11,  creatinine 1.4.  Her Troponin's were negative during this hospitalization.  INR was normal.  Liver functions were normal.  Lipid profile shows an LDL of  72, HDL of 43.  TSH was 6.92.   EKG revealed sinus rhythm, sinus bradycardia with PACs.   DISPOSITION:  The patient will hopefully be discharged tomorrow, August 22, 2004.  She will follow up with Dr. Alanda Amass in a couple of weeks in the  office.  Her CT scan is pending at the time of this dictation but should be  back by tomorrow.       LKK/MEDQ  D:  08/21/2004  T:  08/21/2004  Job:  [id number]   cc:   Tammy R. Collins Scotland, M.D.  [address]  [place]   [place] [address]  Fax: [phone]

## 2010-07-31 NOTE — Op Note (Signed)
[name], FOWLE NO.:  [id number]   MEDICAL RECORD NO.:  [id number]          PATIENT TYPE:  INP   LOCATION:  0165                         FACILITY:  [facility]   PHYSICIAN:  Lenard Galloway. Mortenson, M.D.DATE OF BIRTH:  11/07/1917   DATE OF PROCEDURE:  03/24/2006  DATE OF DISCHARGE:                               OPERATIVE REPORT   PREOPERATIVE DIAGNOSES:  Four-part displaced intertrochanteric fracture,  right hip.   POSTOPERATIVE DIAGNOSES:  Four-part displaced intertrochanteric  fracture, right hip.   OPERATION:  DePuy trochanteric short nail with an 85 mm compression  screw locked distally with one screw.   ANESTHESIA:  General.   SURGEON:  Rodney A. Chaney Malling, M.D.   Threasa HeadsChestine Spore.   DESCRIPTION OF PROCEDURE:  The patient was placed on the fracture table  in supine position. After satisfactory general anesthesia, the right  lower extremity was placed on the fracture table and the fracture was  reduced and this was confirmed by radiographs. The hip was then prepped  with Hibiclens and the __________  drape was applied. A guidewire was  passed through the skin above the greater trochanter and brought down to  the hip of the greater trochanter and this was all done under  radiographic control. The guidewire was then passed distally through the  greater trochanter and into the part of the distal femoral fragment. An  incision was made proximally and a drill was placed over the guidepin  and a hole in the greater trochanter was made. Once this was done  satisfactorily, a 9 mm short intertrochanteric nail was passed over the  guidepin and passed across the fracture site. An almost anatomic  reduction was achieved with this maneuver. Once this was accomplished,  the external guide was used to line up the compression screw. An  incision made through the skin and the guidepin is passed up through the  trochanteric nail through the femoral neck into the head.  This was  checked in both the AP and lateral views and the guidepin was properly  positioned. This measured 90 mm in length. An 85 mm drill was then  passed over the guidepin and passed up into the femoral head. On AP and  lateral views, this was in perfect position. The drill was removed and  an 85 mm compression screw was selected and passed over the guidepin and  driven up into the head. On both AP and lateral views, this was in  absolutely perfect position. Compression was applied. The compression  device was then removed. Using the external guide, two screws were  passed distally through the distal 2 screws in the short nail. An  excellent construct and excellent reduction was achieved and was quite  stable once this was accomplished. The proximal wound was closed with  several 2-0 Vicryl sutures, stainless steel staples in the proximal and  distal incisions. Sterile dressings were applied and the patient  returned to the recovery room in excellent condition. Technically this  went extremely well.   DRAINS:  None.   COMPLICATIONS:  None.  ______________________________  Lenard Galloway Chaney Malling, M.D.     RAM/MEDQ  D:  03/24/2006  T:  03/25/2006  Job:  [id number]

## 2010-08-10 ENCOUNTER — Emergency Department (HOSPITAL_BASED_OUTPATIENT_CLINIC_OR_DEPARTMENT_OTHER)
Admission: EM | Admit: 2010-08-10 | Discharge: 2010-08-11 | Disposition: A | Discharge status: Other Acute Inpatient Hospital | Payer: Medicare Other | Source: Home / Self Care | Attending: Emergency Medicine | Admitting: Emergency Medicine

## 2010-08-10 LAB — DIFFERENTIAL
Basophils Absolute: 0 10*3/uL (ref 0.0–0.1)
Eosinophils Relative: 3 % (ref 0–5)
Lymphocytes Relative: 26 % (ref 12–46)
Lymphs Abs: 1.5 10*3/uL (ref 0.7–4.0)
Neutro Abs: 3.1 10*3/uL (ref 1.7–7.7)
Neutrophils Relative %: 56 % (ref 43–77)

## 2010-08-10 LAB — CBC
HCT: 36.4 % (ref 36.0–46.0)
Hemoglobin: 11.4 g/dL — ABNORMAL LOW (ref 12.0–15.0)
MCV: 102.2 fL — ABNORMAL HIGH (ref 78.0–100.0)
RBC: 3.56 MIL/uL — ABNORMAL LOW (ref 3.87–5.11)
RDW: 13 % (ref 11.5–15.5)
WBC: 5.6 10*3/uL (ref 4.0–10.5)

## 2010-08-10 LAB — URINALYSIS, ROUTINE W REFLEX MICROSCOPIC
Bilirubin Urine: NEGATIVE
Hgb urine dipstick: NEGATIVE
Specific Gravity, Urine: 1.015 (ref 1.005–1.030)
Urobilinogen, UA: 1 mg/dL (ref 0.0–1.0)
pH: 7 (ref 5.0–8.0)

## 2010-08-10 LAB — COMPREHENSIVE METABOLIC PANEL
AST: 20 U/L (ref 0–37)
Albumin: 3.4 g/dL — ABNORMAL LOW (ref 3.5–5.2)
Alkaline Phosphatase: 75 U/L (ref 39–117)
Chloride: 102 mEq/L (ref 96–112)
GFR calc Af Amer: 39 mL/min — ABNORMAL LOW (ref >60)
Potassium: 4.2 mEq/L (ref 3.5–5.1)
Total Bilirubin: 0.3 mg/dL (ref 0.3–1.2)

## 2010-08-10 LAB — URINE MICROSCOPIC-ADD ON

## 2010-08-11 ENCOUNTER — Inpatient Hospital Stay (HOSPITAL_COMMUNITY)
Admission: EM | Admit: 2010-08-11 | Discharge: 2010-08-13 | DRG: 603 | Disposition: A | Discharge status: Home / Self Care | Payer: Medicare Other | Source: Other Acute Inpatient Hospital | Attending: Internal Medicine | Admitting: Internal Medicine

## 2010-08-11 DIAGNOSIS — L02419 Cutaneous abscess of limb, unspecified: Principal | ICD-10-CM | POA: Diagnosis present

## 2010-08-11 DIAGNOSIS — Z96649 Presence of unspecified artificial hip joint: Secondary | ICD-10-CM

## 2010-08-11 DIAGNOSIS — E7800 Pure hypercholesterolemia, unspecified: Secondary | ICD-10-CM | POA: Diagnosis present

## 2010-08-11 DIAGNOSIS — Z9861 Coronary angioplasty status: Secondary | ICD-10-CM

## 2010-08-11 DIAGNOSIS — B961 Klebsiella pneumoniae [K. pneumoniae] as the cause of diseases classified elsewhere: Secondary | ICD-10-CM | POA: Diagnosis present

## 2010-08-11 DIAGNOSIS — R5381 Other malaise: Secondary | ICD-10-CM | POA: Diagnosis present

## 2010-08-11 DIAGNOSIS — N183 Chronic kidney disease, stage 3 unspecified: Secondary | ICD-10-CM | POA: Diagnosis present

## 2010-08-11 DIAGNOSIS — I4891 Unspecified atrial fibrillation: Secondary | ICD-10-CM | POA: Diagnosis present

## 2010-08-11 DIAGNOSIS — I714 Abdominal aortic aneurysm, without rupture, unspecified: Secondary | ICD-10-CM | POA: Diagnosis present

## 2010-08-11 DIAGNOSIS — I739 Peripheral vascular disease, unspecified: Secondary | ICD-10-CM

## 2010-08-11 DIAGNOSIS — J449 Chronic obstructive pulmonary disease, unspecified: Secondary | ICD-10-CM | POA: Diagnosis present

## 2010-08-11 DIAGNOSIS — I509 Heart failure, unspecified: Secondary | ICD-10-CM | POA: Diagnosis present

## 2010-08-11 DIAGNOSIS — I2510 Atherosclerotic heart disease of native coronary artery without angina pectoris: Secondary | ICD-10-CM | POA: Diagnosis present

## 2010-08-11 DIAGNOSIS — J4489 Other specified chronic obstructive pulmonary disease: Secondary | ICD-10-CM | POA: Diagnosis present

## 2010-08-11 DIAGNOSIS — I5032 Chronic diastolic (congestive) heart failure: Secondary | ICD-10-CM | POA: Diagnosis present

## 2010-08-11 DIAGNOSIS — I129 Hypertensive chronic kidney disease with stage 1 through stage 4 chronic kidney disease, or unspecified chronic kidney disease: Secondary | ICD-10-CM | POA: Diagnosis present

## 2010-08-11 DIAGNOSIS — E039 Hypothyroidism, unspecified: Secondary | ICD-10-CM | POA: Diagnosis present

## 2010-08-11 DIAGNOSIS — N390 Urinary tract infection, site not specified: Secondary | ICD-10-CM | POA: Diagnosis present

## 2010-08-12 LAB — BASIC METABOLIC PANEL
Chloride: 106 mEq/L (ref 96–112)
Creatinine, Ser: 1.24 mg/dL — ABNORMAL HIGH (ref 0.4–1.2)
GFR calc Af Amer: 49 mL/min — ABNORMAL LOW (ref >60)
Potassium: 4.1 mEq/L (ref 3.5–5.1)

## 2010-08-12 LAB — URINE CULTURE

## 2010-08-12 LAB — CBC
Platelets: 131 10*3/uL — ABNORMAL LOW (ref 150–400)
RBC: 3.36 MIL/uL — ABNORMAL LOW (ref 3.87–5.11)
WBC: 5.1 10*3/uL (ref 4.0–10.5)

## 2010-08-12 NOTE — H&P (Signed)
[name], [name] NO.:  [id number]  MEDICAL RECORD NO.:  [id number]           PATIENT TYPE:  I  LOCATION:  4702                         FACILITY:  [facility]  PHYSICIAN:  Houston Siren, MD           DATE OF BIRTH:  1917/03/17  DATE OF ADMISSION:  08/11/2010 DATE OF DISCHARGE:                             HISTORY & PHYSICAL   PRIMARY CARE PHYSICIAN:  None.  REASON FOR ADMISSION:  Cellulitis and urinary tract infection.  ADVANCE DIRECTIVE:  Full code.  HISTORY OF PRESENT ILLNESS:  This is a 77 year old female who lives alone having history of coronary artery disease, emphysema, congestive heart failure, hypertension, atrial fibrillation, prior GI bleed, hypothyroidism, chronic renal insufficiency, status post right hip replacement, presents to [facility] with 2-day history of left leg swelling and redness.  She has pain with walking.  She has no prior history of gout, and has no MTP joint tenderness.  Evaluation inthe emergency room included a normal white count, a creatinine of 1.5, a blood glucose of 98.  Her urinalysis is also positive with 21-50 wbc's, many bacteria, and the presence of nitrites.  She was subsequently transferred here for admission due to her advanced age and comorbidity.  PAST MEDICAL HISTORY: 1. Coronary artery disease. 2. Congestive heart failure. 3. COPD. 4. Hypertension. 5. Abdominal aortic aneurysm. 6. Atrial fibrillation. 7. GI bleed. 8. Hypercholesterolemia. 9. Hypothyroidism. 10.Renal insufficiency.  PAST SURGICAL HISTORY:  Hip replacement on the right.  ALLERGIES:  IODINE and NUBAIN.  CURRENT MEDICATIONS:  Aspirin, Plavix, potassium chloride, Zantac, Synthroid, Lopressor, Lasix, pravastatin, amiodarone, and potassium supplement.  REVIEW OF SYSTEMS:  Otherwise unremarkable.  She has no chest pain, palpitation, nausea, vomiting, fever. Or chills.  FAMILY HISTORY:  Noncontributory.  PHYSICAL EXAMINATION:  VITAL  SIGNS:  Temperature 98.0, blood pressure 130/50, pulse of 70, respiratory rate was 16. GENERAL:  She is alert and oriented.  She is able to ambulate.  She does have severe kyphosis. HEENT:  She has facial symmetry with fluent speech.  Tongue is midline. Uvula elevated with phonation.  Sclerae are nonicteric. CARDIAC:  S1, S2 regular. LUNGS:  Clear but with decreased breath sounds. ABDOMEN:  Soft, nondistended, nontender. EXTREMITIES:  Bilateral swelling of her lower extremities.  There is erythema on the left leg consistent with cellulitis.  No calf tenderness.  Good distal pulses bilaterally. SKIN:  Warm and dry.  No MTP swelling.  LABORATORY STUDIES:  As above.  IMPRESSION:  This is a 77 year old female with multiple medical problems including hypothyroidism, paroxysmal atrial fibrillation, chronic renal insufficiency, but without diabetes, presents with cellulitis and a urinary tract infection.  She was given Rocephin for her UTI and vancomycin for her cellulitis. We will continue these 2 antibiotics.  We will reconcile her medication and most likely will continue all of them.  She is a full code and will be admitted to Team 6.  She is quite stable.     Houston Siren, MD     PL/MEDQ  D:  08/11/2010  T:  08/11/2010  Job:  [id number]  Electronically Signed by Houston Siren  on 08/12/2010 03:30:10 AM

## 2010-08-13 LAB — DIFFERENTIAL
Basophils Relative: 0 % (ref 0–1)
Lymphs Abs: 1.4 10*3/uL (ref 0.7–4.0)
Monocytes Relative: 12 % (ref 3–12)
Neutro Abs: 3.4 10*3/uL (ref 1.7–7.7)
Neutrophils Relative %: 60 % (ref 43–77)

## 2010-08-13 LAB — CBC
Hemoglobin: 11 g/dL — ABNORMAL LOW (ref 12.0–15.0)
MCH: 31.7 pg (ref 26.0–34.0)
RBC: 3.47 MIL/uL — ABNORMAL LOW (ref 3.87–5.11)
WBC: 5.7 10*3/uL (ref 4.0–10.5)

## 2010-08-13 LAB — BASIC METABOLIC PANEL
CO2: 31 mEq/L (ref 19–32)
Chloride: 107 mEq/L (ref 96–112)
Creatinine, Ser: 1.26 mg/dL — ABNORMAL HIGH (ref 0.4–1.2)
GFR calc Af Amer: 48 mL/min — ABNORMAL LOW (ref >60)
Potassium: 4.2 mEq/L (ref 3.5–5.1)
Sodium: 145 mEq/L (ref 135–145)

## 2010-08-13 LAB — PHOSPHORUS: Phosphorus: 3.5 mg/dL (ref 2.3–4.6)

## 2010-08-16 ENCOUNTER — Emergency Department (HOSPITAL_BASED_OUTPATIENT_CLINIC_OR_DEPARTMENT_OTHER)
Admission: EM | Admit: 2010-08-16 | Discharge: 2010-08-16 | Disposition: A | Discharge status: Home / Self Care | Payer: Medicare Other | Attending: Emergency Medicine | Admitting: Emergency Medicine

## 2010-08-16 DIAGNOSIS — I509 Heart failure, unspecified: Secondary | ICD-10-CM | POA: Insufficient documentation

## 2010-08-16 DIAGNOSIS — I4891 Unspecified atrial fibrillation: Secondary | ICD-10-CM | POA: Insufficient documentation

## 2010-08-16 DIAGNOSIS — J4489 Other specified chronic obstructive pulmonary disease: Secondary | ICD-10-CM | POA: Insufficient documentation

## 2010-08-16 DIAGNOSIS — I10 Essential (primary) hypertension: Secondary | ICD-10-CM | POA: Insufficient documentation

## 2010-08-16 DIAGNOSIS — I2510 Atherosclerotic heart disease of native coronary artery without angina pectoris: Secondary | ICD-10-CM | POA: Insufficient documentation

## 2010-08-16 DIAGNOSIS — J449 Chronic obstructive pulmonary disease, unspecified: Secondary | ICD-10-CM | POA: Insufficient documentation

## 2010-08-16 DIAGNOSIS — Z79899 Other long term (current) drug therapy: Secondary | ICD-10-CM | POA: Insufficient documentation

## 2010-08-16 DIAGNOSIS — R197 Diarrhea, unspecified: Secondary | ICD-10-CM | POA: Insufficient documentation

## 2010-08-16 LAB — CBC
HCT: 40.3 % (ref 36.0–46.0)
Hemoglobin: 12.8 g/dL (ref 12.0–15.0)
MCV: 100.5 fL — ABNORMAL HIGH (ref 78.0–100.0)
RBC: 4.01 MIL/uL (ref 3.87–5.11)
WBC: 6.4 10*3/uL (ref 4.0–10.5)

## 2010-08-16 LAB — DIFFERENTIAL
Basophils Absolute: 0 10*3/uL (ref 0.0–0.1)
Lymphocytes Relative: 21 % (ref 12–46)
Lymphs Abs: 1.4 10*3/uL (ref 0.7–4.0)
Neutro Abs: 3.9 10*3/uL (ref 1.7–7.7)
Neutrophils Relative %: 62 % (ref 43–77)

## 2010-08-16 LAB — BASIC METABOLIC PANEL
BUN: 19 mg/dL (ref 6–23)
Chloride: 98 mEq/L (ref 96–112)
Glucose, Bld: 101 mg/dL — ABNORMAL HIGH (ref 70–99)
Potassium: 4.3 mEq/L (ref 3.5–5.1)
Sodium: 140 mEq/L (ref 135–145)

## 2010-08-25 NOTE — Discharge Summary (Signed)
[name], [name]                 ACCOUNT NO.:  [id number]  MEDICAL RECORD NO.:  [id number]           PATIENT TYPE:  I  LOCATION:  4501                         FACILITY:  [facility]  PHYSICIAN:  Rock Nephew, MD       DATE OF BIRTH:  Feb 23, 1918  DATE OF ADMISSION:  08/11/2010 DATE OF DISCHARGE:  08/13/2010                        DISCHARGE SUMMARY - REFERRING   PRIMARY CARE PHYSICIAN:  Brandy R. Collins Scotland, MD  DISCHARGE DIAGNOSES:  Are as follows; 1. Bilateral lower extremity cellulitis. 2. Klebsiella urinary tract infection. 3. Coronary artery disease. 4. Chronic obstructive pulmonary disease. 5. Hypertension. 6. History of abdominal aortic aneurysm. 7. Hypothyroidism. 8. Atrial fibrillation, not a candidate for anticoagulation. 9. Chronic kidney disease, stage III. 10.Deconditioning. 11.Also history of abdominal aortic aneurysm. 12.History of gastrointestinal bleed.  DISCHARGE MEDICATIONS:  For the patient are as follows; 1. Clindamycin 300 mg p.o. three times a day for 10 days. 2. Florastor 1 tablet p.o. twice daily for 10 days. 3. Levofloxacin 250 mg by mouth twice daily for 3 days. 4. Furosemide 40 mg home dosing, alternating between 20 mg and 40 mg     daily. 5. Ipratropium 2 sprays nasally twice daily. 6. Levothyroxine 150 mcg p.o. daily. 7. Metoprolol XL 150 mg p.o. daily. 8. Plavix 75 mg p.o. daily. 9. Pravastatin 40 mg p.o. daily. 10.Potassium chloride 20 mEq, take 1 tablet Monday to Friday. 11.Ranitidine 300 mg p.o. daily.  DISPOSITION:  The patient is discharged home.  The patient's diet should be heart-healthy.  PROCEDURES PERFORMED:  The patient had arterial Dopplers which showed normal arterial flow bilaterally via pressure.  The patient also had ankle-brachial index done bilaterally, which showed right ankle brachial index of 1.38 and left ankle-brachial index of 1.16, which seems to normal arterial bilaterally.  CONSULTATIONS ON THIS CASE:  Dr. Nanetta Batty, [facility].  FOLLOWUP:  The patient should follow up with Dr. Herb Grays in 1 week. The patient should follow up with Dr. Susa Griffins in 1 to 2 weeks.  BRIEF HISTORY OF PRESENT ILLNESS AND CHIEF COMPLAINT:  Cellulitis and urinary tract infection.  A 77 year old female who lives alone having history of coronary artery disease, emphysema, CHF, hypertension, AFib, prior GI bleed, hypothyroidism, chronic renal insufficiency.  Now chief complaint of bilateral lower extremity cellulitis and UTI.  HOSPITAL COURSE: 1. Bilateral lower extremity cellulitis.  The patient was placed on     vancomycin and ceftriaxone.  The patient did not have any     leukocytosis.  The patient's leg pain improved and the patient was     able to ambulate without difficulty.  The patient will be switched     over to clindamycin for another 10 days to complete a 12-day course     of antibiotics and cellulitis.  The patient will get three more     days of Levaquin.  The patient received ceftriaxone, but was     switched over to Levaquin for UTI.  The Klebsiella is sensitive to     Levaquin. 2. Coronary artery disease.  Coronary artery disease is stable.  The  patient will continue on cardiac medications. 3. COPD.  COPD is stable.  No acute intervention. 4. Hypertension.  The patient's blood pressure has been controlled     with admission. 5. History of abdominal aortic aneurysm, which is stable. 6. Hypothyroidism.  The patient is on Synthroid.  The patient's     thyroid function tests was within normal limits. 7. AFib.  The patient has a history of AFib.  The patient is not a     candidate for anticoagulation.  The patient is on metoprolol. 8. Chronic kidney disease, stage III.  The patient's creatinine     currently is 1.26.  She has chronic kidney disease stage III, and     intervention was done. 9. Deconditioning, debility.  The patient was seen by PT/OT.  They      recommended outpatient PT; however, the patient is not interested.     I will give the patient a prescription for outpatient PT if the     patient changes her mind. 10.DVT prophylaxis.  The patient was on Lovenox.  Also, I will give     the patient some Florastor, also probiotic results and hopes that     the patient does not get C diff.     Rock Nephew, MD     NH/MEDQ  D:  08/13/2010  T:  08/13/2010  Job:  [id number]  cc:   [name], M.D.  Electronically Signed by Rock Nephew MD on 08/25/2010 12:34:09 PM

## 2010-08-27 ENCOUNTER — Emergency Department (HOSPITAL_BASED_OUTPATIENT_CLINIC_OR_DEPARTMENT_OTHER)
Admission: EM | Admit: 2010-08-27 | Discharge: 2010-08-27 | Disposition: A | Discharge status: Home / Self Care | Payer: Medicare Other | Attending: Emergency Medicine | Admitting: Emergency Medicine

## 2010-08-27 ENCOUNTER — Emergency Department (INDEPENDENT_AMBULATORY_CARE_PROVIDER_SITE_OTHER): Payer: Medicare Other

## 2010-08-27 DIAGNOSIS — I2510 Atherosclerotic heart disease of native coronary artery without angina pectoris: Secondary | ICD-10-CM | POA: Insufficient documentation

## 2010-08-27 DIAGNOSIS — I517 Cardiomegaly: Secondary | ICD-10-CM

## 2010-08-27 DIAGNOSIS — I509 Heart failure, unspecified: Secondary | ICD-10-CM

## 2010-08-27 DIAGNOSIS — Z79899 Other long term (current) drug therapy: Secondary | ICD-10-CM | POA: Insufficient documentation

## 2010-08-27 DIAGNOSIS — E7800 Pure hypercholesterolemia, unspecified: Secondary | ICD-10-CM | POA: Insufficient documentation

## 2010-08-27 DIAGNOSIS — J449 Chronic obstructive pulmonary disease, unspecified: Secondary | ICD-10-CM | POA: Insufficient documentation

## 2010-08-27 DIAGNOSIS — L02419 Cutaneous abscess of limb, unspecified: Secondary | ICD-10-CM | POA: Insufficient documentation

## 2010-08-27 DIAGNOSIS — I10 Essential (primary) hypertension: Secondary | ICD-10-CM | POA: Insufficient documentation

## 2010-08-27 DIAGNOSIS — R5381 Other malaise: Secondary | ICD-10-CM

## 2010-08-27 DIAGNOSIS — I4891 Unspecified atrial fibrillation: Secondary | ICD-10-CM | POA: Insufficient documentation

## 2010-08-27 DIAGNOSIS — J4489 Other specified chronic obstructive pulmonary disease: Secondary | ICD-10-CM | POA: Insufficient documentation

## 2010-08-27 DIAGNOSIS — R197 Diarrhea, unspecified: Secondary | ICD-10-CM

## 2010-08-27 DIAGNOSIS — E860 Dehydration: Secondary | ICD-10-CM | POA: Insufficient documentation

## 2010-08-27 LAB — COMPREHENSIVE METABOLIC PANEL
AST: 21 U/L (ref 0–37)
BUN: 30 mg/dL — ABNORMAL HIGH (ref 6–23)
CO2: 31 mEq/L (ref 19–32)
Calcium: 9.5 mg/dL (ref 8.4–10.5)
Chloride: 99 mEq/L (ref 96–112)
Creatinine, Ser: 1.7 mg/dL — ABNORMAL HIGH (ref 0.4–1.2)
GFR calc non Af Amer: 28 mL/min — ABNORMAL LOW (ref >60)
Total Bilirubin: 0.3 mg/dL (ref 0.3–1.2)

## 2010-08-27 LAB — URINALYSIS, ROUTINE W REFLEX MICROSCOPIC
Hgb urine dipstick: NEGATIVE
Leukocytes, UA: NEGATIVE
Nitrite: NEGATIVE
Protein, ur: NEGATIVE mg/dL
Specific Gravity, Urine: 1.013 (ref 1.005–1.030)
Urobilinogen, UA: 0.2 mg/dL (ref 0.0–1.0)

## 2010-08-27 LAB — CBC
HCT: 36.9 % (ref 36.0–46.0)
MCH: 32.2 pg (ref 26.0–34.0)
MCV: 100 fL (ref 78.0–100.0)
Platelets: 123 10*3/uL — ABNORMAL LOW (ref 150–400)
RBC: 3.69 MIL/uL — ABNORMAL LOW (ref 3.87–5.11)
RDW: 13.3 % (ref 11.5–15.5)

## 2010-08-27 LAB — DIFFERENTIAL
Eosinophils Absolute: 0.2 10*3/uL (ref 0.0–0.7)
Eosinophils Relative: 4 % (ref 0–5)
Lymphocytes Relative: 31 % (ref 12–46)
Lymphs Abs: 1.7 10*3/uL (ref 0.7–4.0)
Monocytes Relative: 16 % — ABNORMAL HIGH (ref 3–12)

## 2010-08-29 LAB — URINE CULTURE: Culture: NO GROWTH

## 2010-10-08 HISTORY — PX: NM MYOCAR PERF WALL MOTION: HXRAD629

## 2010-12-03 LAB — BASIC METABOLIC PANEL
BUN: 21
CO2: 29
CO2: 29
Calcium: 8.4
Chloride: 106
Creatinine, Ser: 1.67 — ABNORMAL HIGH
GFR calc Af Amer: 35 — ABNORMAL LOW
Glucose, Bld: 77
Potassium: 4.5
Sodium: 140
Sodium: 140

## 2010-12-03 LAB — CROSSMATCH: Antibody Screen: NEGATIVE

## 2010-12-03 LAB — CBC
HCT: 35.9 — ABNORMAL LOW
HCT: 36.1
Hemoglobin: 12.2
Hemoglobin: 12.3
MCHC: 34.1
MCV: 95.1
Platelets: 199
RBC: 3.79 — ABNORMAL LOW
RDW: 18.3 — ABNORMAL HIGH
WBC: 5.3

## 2010-12-03 LAB — OCCULT BLOOD X 1 CARD TO LAB, STOOL: Fecal Occult Bld: POSITIVE

## 2010-12-03 LAB — LIPID PANEL
Cholesterol: 154
HDL: 34 — ABNORMAL LOW
LDL Cholesterol: 94
Total CHOL/HDL Ratio: 4.5

## 2010-12-03 LAB — CARDIAC PANEL(CRET KIN+CKTOT+MB+TROPI): Relative Index: INVALID

## 2010-12-03 LAB — B-NATRIURETIC PEPTIDE (CONVERTED LAB): Pro B Natriuretic peptide (BNP): 238 — ABNORMAL HIGH

## 2010-12-11 LAB — CBC
HCT: 36.9
HCT: 38.4
HCT: 38.6
Hemoglobin: 12.3
Hemoglobin: 12.5
Hemoglobin: 12.9
MCHC: 33.3
Platelets: 122 — ABNORMAL LOW
Platelets: 89 — ABNORMAL LOW
RBC: 3.72 — ABNORMAL LOW
RBC: 3.77 — ABNORMAL LOW
RBC: 3.88
RDW: 12.8
WBC: 6.1

## 2010-12-11 LAB — DIFFERENTIAL
Eosinophils Relative: 3
Myelocytes: 0
Neutro Abs: 3.3
Neutrophils Relative %: 65
Promyelocytes Absolute: 0
nRBC: 0

## 2010-12-11 LAB — BASIC METABOLIC PANEL
BUN: 18
Calcium: 8.8
Calcium: 9.5
GFR calc Af Amer: 37 — ABNORMAL LOW
GFR calc Af Amer: 41 — ABNORMAL LOW
GFR calc non Af Amer: 30 — ABNORMAL LOW
GFR calc non Af Amer: 31 — ABNORMAL LOW
GFR calc non Af Amer: 34 — ABNORMAL LOW
Glucose, Bld: 86
Potassium: 3.6
Potassium: 4.1
Potassium: 4.5
Sodium: 138
Sodium: 140

## 2010-12-11 LAB — COMPREHENSIVE METABOLIC PANEL
ALT: 10
Alkaline Phosphatase: 63
BUN: 15
CO2: 31
Calcium: 9.6
GFR calc non Af Amer: 30 — ABNORMAL LOW
Glucose, Bld: 90
Sodium: 145

## 2010-12-11 LAB — OCCULT BLOOD X 1 CARD TO LAB, STOOL
Fecal Occult Bld: NEGATIVE
Fecal Occult Bld: NEGATIVE

## 2010-12-11 LAB — URINALYSIS, ROUTINE W REFLEX MICROSCOPIC
Hgb urine dipstick: NEGATIVE
Nitrite: NEGATIVE
Specific Gravity, Urine: 1.008
Urobilinogen, UA: 0.2
pH: 5.5

## 2010-12-11 LAB — CK TOTAL AND CKMB (NOT AT ARMC)
CK, MB: 2
Relative Index: 1.6
Relative Index: 1.7

## 2010-12-11 LAB — TROPONIN I
Troponin I: 0.01
Troponin I: 0.02

## 2010-12-11 LAB — LIPID PANEL
Cholesterol: 159
HDL: 47
LDL Cholesterol: 96
Total CHOL/HDL Ratio: 3.4

## 2010-12-11 LAB — POCT CARDIAC MARKERS
CKMB, poc: 1.2
Myoglobin, poc: 199
Operator id: 295021

## 2010-12-11 LAB — APTT: aPTT: 36

## 2010-12-11 LAB — HEPARIN LEVEL (UNFRACTIONATED): Heparin Unfractionated: 0.49

## 2010-12-11 LAB — PROTIME-INR: INR: 1

## 2010-12-18 LAB — CBC
HCT: 35 — ABNORMAL LOW
HCT: 37.4
Hemoglobin: 11.7 — ABNORMAL LOW
Hemoglobin: 12
Hemoglobin: 12.6
MCHC: 33.4
MCV: 98.1
MCV: 99
Platelets: 127 — ABNORMAL LOW
RBC: 3.53 — ABNORMAL LOW
RBC: 3.57 — ABNORMAL LOW
RDW: 14.1
WBC: 6
WBC: 8.6

## 2010-12-18 LAB — URINALYSIS, ROUTINE W REFLEX MICROSCOPIC
Glucose, UA: NEGATIVE
Ketones, ur: NEGATIVE
Specific Gravity, Urine: 1.007
pH: 5

## 2010-12-18 LAB — BASIC METABOLIC PANEL
BUN: 22
CO2: 32
Chloride: 105
Chloride: 107
GFR calc Af Amer: 32 — ABNORMAL LOW
GFR calc non Af Amer: 33 — ABNORMAL LOW
Potassium: 4.9
Sodium: 141
Sodium: 143

## 2010-12-18 LAB — HEMOGLOBIN A1C
Hgb A1c MFr Bld: 5.8
Mean Plasma Glucose: 129

## 2010-12-18 LAB — LIPID PANEL
Cholesterol: 165
HDL: 38 — ABNORMAL LOW
LDL Cholesterol: 111 — ABNORMAL HIGH
Total CHOL/HDL Ratio: 4.3
Triglycerides: 79

## 2010-12-18 LAB — COMPREHENSIVE METABOLIC PANEL
Alkaline Phosphatase: 73
BUN: 18
Glucose, Bld: 93
Potassium: 5
Total Bilirubin: 0.6
Total Protein: 5.7 — ABNORMAL LOW

## 2010-12-18 LAB — HOMOCYSTEINE: Homocysteine: 14.1

## 2010-12-18 LAB — DIFFERENTIAL
Basophils Absolute: 0
Basophils Relative: 0
Monocytes Relative: 10
Neutro Abs: 3.7
Neutrophils Relative %: 63

## 2011-03-16 DIAGNOSIS — Z96641 Presence of right artificial hip joint: Secondary | ICD-10-CM

## 2011-03-16 HISTORY — DX: Presence of right artificial hip joint: Z96.641

## 2011-03-17 DIAGNOSIS — Z79899 Other long term (current) drug therapy: Secondary | ICD-10-CM | POA: Diagnosis not present

## 2011-03-17 DIAGNOSIS — R5381 Other malaise: Secondary | ICD-10-CM | POA: Diagnosis not present

## 2011-03-17 DIAGNOSIS — I4891 Unspecified atrial fibrillation: Secondary | ICD-10-CM | POA: Diagnosis not present

## 2011-03-17 DIAGNOSIS — M109 Gout, unspecified: Secondary | ICD-10-CM | POA: Diagnosis not present

## 2011-03-17 DIAGNOSIS — I495 Sick sinus syndrome: Secondary | ICD-10-CM | POA: Diagnosis not present

## 2011-03-17 DIAGNOSIS — I739 Peripheral vascular disease, unspecified: Secondary | ICD-10-CM | POA: Diagnosis not present

## 2011-03-19 DIAGNOSIS — M259 Joint disorder, unspecified: Secondary | ICD-10-CM | POA: Diagnosis not present

## 2011-04-15 DIAGNOSIS — L97509 Non-pressure chronic ulcer of other part of unspecified foot with unspecified severity: Secondary | ICD-10-CM | POA: Diagnosis not present

## 2011-04-15 DIAGNOSIS — C44320 Squamous cell carcinoma of skin of unspecified parts of face: Secondary | ICD-10-CM | POA: Diagnosis not present

## 2011-04-15 DIAGNOSIS — D485 Neoplasm of uncertain behavior of skin: Secondary | ICD-10-CM | POA: Diagnosis not present

## 2011-04-28 DIAGNOSIS — R609 Edema, unspecified: Secondary | ICD-10-CM | POA: Diagnosis not present

## 2011-04-28 DIAGNOSIS — R030 Elevated blood-pressure reading, without diagnosis of hypertension: Secondary | ICD-10-CM | POA: Diagnosis not present

## 2011-04-28 DIAGNOSIS — L03119 Cellulitis of unspecified part of limb: Secondary | ICD-10-CM | POA: Diagnosis not present

## 2011-04-28 DIAGNOSIS — L02419 Cutaneous abscess of limb, unspecified: Secondary | ICD-10-CM | POA: Diagnosis not present

## 2011-04-28 DIAGNOSIS — M259 Joint disorder, unspecified: Secondary | ICD-10-CM | POA: Diagnosis not present

## 2011-04-29 ENCOUNTER — Ambulatory Visit (INDEPENDENT_AMBULATORY_CARE_PROVIDER_SITE_OTHER): Payer: Medicare Other | Admitting: *Deleted

## 2011-04-29 DIAGNOSIS — M169 Osteoarthritis of hip, unspecified: Secondary | ICD-10-CM | POA: Diagnosis not present

## 2011-04-29 DIAGNOSIS — M7989 Other specified soft tissue disorders: Secondary | ICD-10-CM

## 2011-04-29 DIAGNOSIS — M79609 Pain in unspecified limb: Secondary | ICD-10-CM | POA: Diagnosis not present

## 2011-04-29 DIAGNOSIS — M25559 Pain in unspecified hip: Secondary | ICD-10-CM | POA: Diagnosis not present

## 2011-05-06 DIAGNOSIS — C44320 Squamous cell carcinoma of skin of unspecified parts of face: Secondary | ICD-10-CM | POA: Diagnosis not present

## 2011-05-10 DIAGNOSIS — R5381 Other malaise: Secondary | ICD-10-CM | POA: Diagnosis not present

## 2011-05-10 DIAGNOSIS — D691 Qualitative platelet defects: Secondary | ICD-10-CM | POA: Diagnosis not present

## 2011-05-10 DIAGNOSIS — R5383 Other fatigue: Secondary | ICD-10-CM | POA: Diagnosis not present

## 2011-05-10 DIAGNOSIS — E039 Hypothyroidism, unspecified: Secondary | ICD-10-CM | POA: Diagnosis not present

## 2011-05-10 DIAGNOSIS — R030 Elevated blood-pressure reading, without diagnosis of hypertension: Secondary | ICD-10-CM | POA: Diagnosis not present

## 2011-05-10 DIAGNOSIS — D696 Thrombocytopenia, unspecified: Secondary | ICD-10-CM | POA: Diagnosis not present

## 2011-05-10 DIAGNOSIS — R718 Other abnormality of red blood cells: Secondary | ICD-10-CM | POA: Diagnosis not present

## 2011-05-10 DIAGNOSIS — D518 Other vitamin B12 deficiency anemias: Secondary | ICD-10-CM | POA: Diagnosis not present

## 2011-05-10 NOTE — Procedures (Unsigned)
DUPLEX DEEP VENOUS EXAM - LOWER EXTREMITY  INDICATION:  Bilateral pain and swelling of feet and legs.  HISTORY:  Edema:  Yes. Trauma/Surgery:  Not recently. Pain:  Yes. PE:  No. Previous DVT:  No. Anticoagulants:  Yes. Other:  DUPLEX EXAM:               CFV   SFV   PopV  PTV      GSV               R  L  R  L  R  L  R    L   R  L Thrombosis    0  0  0  0  0  0  NV   NV  0  0 Spontaneous   +  +  +  +  +  +           +  + Phasic        +  +  +  +  +  +           +  + Augmentation  +  +  +  +  +  +           +  + Compressible  +  +  +  +  +  +           +  + Competent  Legend:  + - yes  o - no  p - partial  D - decreased  IMPRESSION: 1. No evidence of acute deep vein thrombosis or superficial venous     thrombus of the bilateral lower extremities. 2. Cannot rule out non-occlusive chronic deep vein thrombosis in the     bilateral superficial femoral veins due to arterial calcific shadow     and stenting. 3. The bilateral posterior tibial veins were difficult to visualize.   _____________________________ V. Charlena Cross, MD  LT/MEDQ  D:  04/29/2011  T:  04/29/2011  Job:  [id number]

## 2011-05-14 DIAGNOSIS — I714 Abdominal aortic aneurysm, without rupture: Secondary | ICD-10-CM | POA: Diagnosis not present

## 2011-05-14 DIAGNOSIS — R718 Other abnormality of red blood cells: Secondary | ICD-10-CM | POA: Diagnosis not present

## 2011-05-14 DIAGNOSIS — D518 Other vitamin B12 deficiency anemias: Secondary | ICD-10-CM | POA: Diagnosis not present

## 2011-05-14 DIAGNOSIS — D691 Qualitative platelet defects: Secondary | ICD-10-CM | POA: Diagnosis not present

## 2011-05-31 DIAGNOSIS — L03039 Cellulitis of unspecified toe: Secondary | ICD-10-CM | POA: Diagnosis not present

## 2011-05-31 DIAGNOSIS — B369 Superficial mycosis, unspecified: Secondary | ICD-10-CM | POA: Diagnosis not present

## 2011-05-31 DIAGNOSIS — D696 Thrombocytopenia, unspecified: Secondary | ICD-10-CM | POA: Diagnosis not present

## 2011-05-31 DIAGNOSIS — L84 Corns and callosities: Secondary | ICD-10-CM | POA: Diagnosis not present

## 2011-07-26 DIAGNOSIS — M109 Gout, unspecified: Secondary | ICD-10-CM | POA: Diagnosis not present

## 2011-07-26 DIAGNOSIS — R5381 Other malaise: Secondary | ICD-10-CM | POA: Diagnosis not present

## 2011-07-26 DIAGNOSIS — I495 Sick sinus syndrome: Secondary | ICD-10-CM | POA: Diagnosis not present

## 2011-07-26 DIAGNOSIS — I4891 Unspecified atrial fibrillation: Secondary | ICD-10-CM | POA: Diagnosis not present

## 2011-07-26 DIAGNOSIS — I739 Peripheral vascular disease, unspecified: Secondary | ICD-10-CM | POA: Diagnosis not present

## 2011-07-26 DIAGNOSIS — Z79899 Other long term (current) drug therapy: Secondary | ICD-10-CM | POA: Diagnosis not present

## 2011-07-27 DIAGNOSIS — M25559 Pain in unspecified hip: Secondary | ICD-10-CM | POA: Diagnosis not present

## 2011-07-31 DIAGNOSIS — M169 Osteoarthritis of hip, unspecified: Secondary | ICD-10-CM | POA: Diagnosis not present

## 2011-08-05 ENCOUNTER — Other Ambulatory Visit: Payer: Self-pay | Admitting: Orthopedic Surgery

## 2011-08-16 ENCOUNTER — Encounter (HOSPITAL_COMMUNITY): Payer: Self-pay | Admitting: Respiratory Therapy

## 2011-08-17 ENCOUNTER — Inpatient Hospital Stay (HOSPITAL_COMMUNITY): Admission: RE | Admit: 2011-08-17 | Discharge: 2011-08-17 | Payer: Medicare Other | Source: Ambulatory Visit

## 2011-08-17 ENCOUNTER — Encounter (HOSPITAL_COMMUNITY): Payer: Self-pay

## 2011-08-17 HISTORY — DX: Anemia, unspecified: D64.9

## 2011-08-17 HISTORY — DX: Acute myocardial infarction, unspecified: I21.9

## 2011-08-17 HISTORY — DX: Gastro-esophageal reflux disease without esophagitis: K21.9

## 2011-08-17 HISTORY — DX: Presence of cardiac pacemaker: Z95.0

## 2011-08-17 HISTORY — DX: Personal history of other diseases of the digestive system: Z87.19

## 2011-08-17 HISTORY — DX: Urinary tract infection, site not specified: N39.0

## 2011-08-17 HISTORY — DX: Reserved for inherently not codable concepts without codable children: IMO0001

## 2011-08-17 HISTORY — DX: Encounter for other specified aftercare: Z51.89

## 2011-08-17 NOTE — Pre-Procedure Instructions (Signed)
20 ALEXXUS SOBH  08/17/2011   Your procedure is scheduled on:  08/27/11  Report to [facility] at 1015 AM.  Call this number if you have problems the morning of surgery: [phone]   Remember:   Do not eat food:After Midnight.  May have clear liquids: up to 4 Hours before arrival 615 am.  Clear liquids include soda, tea, black coffee, apple or grape juice, broth.  Take these medicines the morning of surgery with A SIP OF WATER: amiodarone, restasis, levothyroxine, metoprolol, zantac  STOP aspirin, plavix 08/20/11   Do not wear jewelry, make-up or nail polish.  Do not wear lotions, powders, or perfumes. You may wear deodorant.  Do not shave 48 hours prior to surgery. Men may shave face and neck.  Do not bring valuables to the hospital.  Contacts, dentures or bridgework may not be worn into surgery.  Leave suitcase in the car. After surgery it may be brought to your room.  For patients admitted to the hospital, checkout time is 11:00 AM the day of discharge.   Patients discharged the day of surgery will not be allowed to drive home.  Name and phone number of your driver:  Special Instructions: Incentive Spirometry - Practice and bring it with you on the day of surgery. and CHG Shower Use Special Wash: 1/2 bottle night before surgery and 1/2 bottle morning of surgery.   Please read over the following fact sheets that you were given: Pain Booklet, Coughing and Deep Breathing, Blood Transfusion Information, MRSA Information and Surgical Site Infection Prevention

## 2011-08-18 ENCOUNTER — Encounter (HOSPITAL_COMMUNITY)
Admission: RE | Admit: 2011-08-18 | Discharge: 2011-08-18 | Disposition: A | Discharge status: Home / Self Care | Payer: Medicare Other | Source: Ambulatory Visit | Attending: Orthopedic Surgery | Admitting: Orthopedic Surgery

## 2011-08-18 DIAGNOSIS — J449 Chronic obstructive pulmonary disease, unspecified: Secondary | ICD-10-CM | POA: Diagnosis not present

## 2011-08-18 DIAGNOSIS — N179 Acute kidney failure, unspecified: Secondary | ICD-10-CM | POA: Diagnosis not present

## 2011-08-18 DIAGNOSIS — I517 Cardiomegaly: Secondary | ICD-10-CM | POA: Diagnosis not present

## 2011-08-18 DIAGNOSIS — D62 Acute posthemorrhagic anemia: Secondary | ICD-10-CM | POA: Diagnosis not present

## 2011-08-18 DIAGNOSIS — Z01811 Encounter for preprocedural respiratory examination: Secondary | ICD-10-CM | POA: Diagnosis not present

## 2011-08-18 DIAGNOSIS — M87059 Idiopathic aseptic necrosis of unspecified femur: Secondary | ICD-10-CM | POA: Diagnosis not present

## 2011-08-18 DIAGNOSIS — N390 Urinary tract infection, site not specified: Secondary | ICD-10-CM | POA: Diagnosis not present

## 2011-08-18 LAB — URINALYSIS, ROUTINE W REFLEX MICROSCOPIC
Glucose, UA: NEGATIVE mg/dL
Hgb urine dipstick: NEGATIVE
Ketones, ur: NEGATIVE mg/dL
Protein, ur: NEGATIVE mg/dL
Urobilinogen, UA: 0.2 mg/dL (ref 0.0–1.0)

## 2011-08-18 LAB — URINE MICROSCOPIC-ADD ON

## 2011-08-18 LAB — CBC
HCT: 39.7 % (ref 36.0–46.0)
MCV: 101.5 fL — ABNORMAL HIGH (ref 78.0–100.0)
RBC: 3.91 MIL/uL (ref 3.87–5.11)
WBC: 6.9 10*3/uL (ref 4.0–10.5)

## 2011-08-18 LAB — SURGICAL PCR SCREEN: MRSA, PCR: NEGATIVE

## 2011-08-18 LAB — BASIC METABOLIC PANEL
CO2: 30 mEq/L (ref 19–32)
Chloride: 101 mEq/L (ref 96–112)
Glucose, Bld: 97 mg/dL (ref 70–99)
Sodium: 140 mEq/L (ref 135–145)

## 2011-08-18 LAB — DIFFERENTIAL
Eosinophils Relative: 3 % (ref 0–5)
Lymphocytes Relative: 25 % (ref 12–46)
Lymphs Abs: 1.7 10*3/uL (ref 0.7–4.0)
Monocytes Absolute: 0.8 10*3/uL (ref 0.1–1.0)
Monocytes Relative: 12 % (ref 3–12)

## 2011-08-18 LAB — APTT: aPTT: 31 seconds (ref 24–37)

## 2011-08-18 NOTE — Progress Notes (Signed)
Nurse called Dr. Wadie Lessen office and left message with [facility] requesting that Dr. Turner Daniels review urinalysis.

## 2011-08-19 NOTE — Consult Note (Addendum)
Anesthesia Chart Review:  Patient is a 78 year old female scheduled for a right hip arthroplasty with hardware removal on 08/27/11.  History includes former smoker, GI bleed '07, HTN, hypothyroidism, TIA, AAA (3.5 cm per ultrasound 3/13), CAD s/p PCI with non-DES LAD and RCA '97 and DES LAD and RCA '06, SSS s/p Medtronic PPM, afib/PAF not felt to be a Coumadin candidate, venous insufficiency, asthma, CKD, GERD, COPD, anemia with history of transfusion, UTI, hiatal hernia.  Her Cardiologist is Dr. Alanda Amass [facility]).  She was last seen on 07/26/11.  He felt she would probably be a candidate for hip surgery if necessary since her rhythm was stable, she denied angina, and had a low risk Myoview in July 2012.  His note mentioned getting arterial LE Dopplers and an echocardiogram.  I'll follow-up with these results once [facility] is open.          Stress test from 10/08/10 showed: mild ischemia in the apical anterior regions, EF 80%, normal LV systolic function. Ultimately it was felt low risk.  Her last cardiac cath was in June 2006.  Findings showed: 1. Successful staged PCI with mid right coronary DES stenting post predilatation 3.0/13 Cipher.  2. Staged high-grade proximal - mid- LAD direct focal stenting DES 2.5 x8 Cipher stent.  3. Widely patent mid LAD stent and widely patent proximal circumflex stent from August 17, 2004.  4. Residual left main coronary stenosis approximately 50% with good flow.  5. Known 70% left renal artery stenosis.  6. Asymptomatic infrarenal abdominal aortic aneurysm.  7. Remote out of hospital AWMI treated with LAD stenting June 16, 1995 widely patent long-term.  8. Staged mid right coronary stent Jul 26, 1995 treated with non DES stent, widely patent on recent study.  9. Well-preserved LV function.   She is scheduled for an echo on 08/23/11.   EKG on 08/18/11 showed a-paced rhythm with prolonged AV conduction/first degree AVB, LAD, LVH with QRS widening (? Incomplete left BBB) and  repolarization abnormality , cannot rule out septal infarct (age undetermined).  Her PR interval has further widened, otherwise I think it appears stable from her last EKG at [facility].    CXR from 08/18/11 showed severe COPD/chronic changes. Cardiomegaly. No change or acute findings.   Labs noted.  Cr 1.4, BUN 18, H/H 12.7/39.7, PLT 137, coags WNL. UA showed large leukocytes, positive nitrites (already called to Dr. Wadie Lessen office).     I'll follow-up 08/19/11 for additional [facility] results.  Addendum: 08/20/11 1400  Dr. Alanda Amass has cleared Ms. Charlesworth for this procedure with low CV risk.  She is scheduled for a repeat echo and LE dopplers at [facility] on 08/23/11.  I've asked the Short Stay staff to request these when available, and them bring them to me for review.   Addendum: 08/26/11 0950  Echo from 08/23/11 showed mildly reduced LV systolic function, EF 50%, LA moderately dilated, mild to moderate mitral annular calcification, moderate MR, mild to moderate TR, RV systolic pressure 45 mmHg, mild-moderate pulmonary HTN, trace AR.  LE duplex from 08/23/11 showed normal ABI, patent right CFA stent, bilateral SFA and left CIA with 0-49% stenosis.  Shonna Chock, PA-C

## 2011-08-23 DIAGNOSIS — I4891 Unspecified atrial fibrillation: Secondary | ICD-10-CM | POA: Diagnosis not present

## 2011-08-23 DIAGNOSIS — M79609 Pain in unspecified limb: Secondary | ICD-10-CM | POA: Diagnosis not present

## 2011-08-23 DIAGNOSIS — I739 Peripheral vascular disease, unspecified: Secondary | ICD-10-CM | POA: Diagnosis not present

## 2011-08-23 DIAGNOSIS — I495 Sick sinus syndrome: Secondary | ICD-10-CM | POA: Diagnosis not present

## 2011-08-23 HISTORY — PX: US ECHOCARDIOGRAPHY: HXRAD669

## 2011-08-24 NOTE — H&P (Signed)
  Subjective: [name] is a 78 year old patient of Dr. Madelon Lips who comes in today for evaluation of right hip pain.  She sustained a 4 part hip fracture, 4-5 years ago and had an Depuy Short Trochanteric Nail by Dr. Chaney Malling .  She has recently had worsening pain and x-rays show a migration of the screw through the femoral head and into the acetabulum.  She has developed avascular necrosis.  The pain is so severe that it  limits her ability to walk.  She uses a walker or wheelchair most of the time.  She uses tramadol for pain control but reports that it makes her too sleepy to use during the daytime.  She localizes her pain to the groin area.  Past Medical Hx: She has a fairly significant cardiac history.  She has a pacemaker and 9 stents.  She is on Plavix.  She also reports that she has an aneurysm that will not require surgery.  Cataracts, GERD, HTN.    Family Hx:  HTN  Social Hx: Denies the use of EtOH or tobacco.  Widowed.    ROS: ROS: Patient denies dizziness, nausea, fever, chills, vomiting, shortness of breath, chest pain, loss of appetite, or rash.    PHYSICAL EXAM: Well-developed, well-nourished.  Awake, alert, and oriented x3.  Extraocular motion is intact.  No use of accessory respiratory muscles for breathing.   Cardiovascular exam reveals a regular rhythm.  Skin is intact without cuts, scrapes, or abrasions.   She has significant pain with any attempts at internal rotation of the right hip.  Positive foot tap.  Surgical incision is well-healed.  She is neurovascularly intact.  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  She understands that any surgical intervention will carry significant risk, given her age and her cardiac history.  We will need to get cardiac clearance.  If she decides to undergo surgery we plan to remove the IM nail and replace her hip with a  Pinnacle cup, polyethylene liner, SROM stem.  In the meantime, she'll continue using tramadol as needed for pain control.

## 2011-08-25 NOTE — Progress Notes (Signed)
Requested  Echo and doppler results from testing on  08/23/2011..be faxed  To [phone]

## 2011-08-26 MED ORDER — CEFAZOLIN SODIUM-DEXTROSE 2-3 GM-% IV SOLR
2.0000 g | INTRAVENOUS | Status: AC
  Administered 2011-08-27: 2 g via INTRAVENOUS
  Filled 2011-08-26: qty 50

## 2011-08-26 MED ORDER — CHLORHEXIDINE GLUCONATE 4 % EX LIQD: 60.0000 mL | Freq: Once | CUTANEOUS | Status: DC

## 2011-08-26 MED ORDER — DEXTROSE-NACL 5-0.45 % IV SOLN: INTRAVENOUS | Status: DC

## 2011-08-26 NOTE — Progress Notes (Signed)
Requested echo/ le dopplers from [facility].

## 2011-08-27 ENCOUNTER — Encounter (HOSPITAL_COMMUNITY): Payer: Self-pay

## 2011-08-27 ENCOUNTER — Inpatient Hospital Stay (HOSPITAL_COMMUNITY)
Admission: RE | Admit: 2011-08-27 | Discharge: 2011-08-31 | DRG: 470 | Disposition: A | Discharge status: Skilled Nursing Facility | Payer: Medicare Other | Source: Ambulatory Visit | Attending: Orthopedic Surgery | Admitting: Orthopedic Surgery

## 2011-08-27 ENCOUNTER — Encounter (HOSPITAL_COMMUNITY): Payer: Self-pay | Admitting: Vascular Surgery

## 2011-08-27 ENCOUNTER — Ambulatory Visit (HOSPITAL_COMMUNITY): Payer: Medicare Other

## 2011-08-27 ENCOUNTER — Encounter (HOSPITAL_COMMUNITY): Payer: Self-pay | Admitting: *Deleted

## 2011-08-27 ENCOUNTER — Encounter (HOSPITAL_COMMUNITY)
Admission: RE | Disposition: A | Discharge status: Skilled Nursing Facility | Payer: Self-pay | Source: Ambulatory Visit | Attending: Orthopedic Surgery

## 2011-08-27 ENCOUNTER — Ambulatory Visit (HOSPITAL_COMMUNITY): Payer: Medicare Other | Admitting: Vascular Surgery

## 2011-08-27 DIAGNOSIS — J449 Chronic obstructive pulmonary disease, unspecified: Secondary | ICD-10-CM | POA: Diagnosis present

## 2011-08-27 DIAGNOSIS — N390 Urinary tract infection, site not specified: Secondary | ICD-10-CM | POA: Diagnosis not present

## 2011-08-27 DIAGNOSIS — Z9861 Coronary angioplasty status: Secondary | ICD-10-CM

## 2011-08-27 DIAGNOSIS — I4891 Unspecified atrial fibrillation: Secondary | ICD-10-CM | POA: Diagnosis present

## 2011-08-27 DIAGNOSIS — I252 Old myocardial infarction: Secondary | ICD-10-CM

## 2011-08-27 DIAGNOSIS — B9689 Other specified bacterial agents as the cause of diseases classified elsewhere: Secondary | ICD-10-CM | POA: Diagnosis present

## 2011-08-27 DIAGNOSIS — M8700 Idiopathic aseptic necrosis of unspecified bone: Secondary | ICD-10-CM | POA: Diagnosis not present

## 2011-08-27 DIAGNOSIS — D696 Thrombocytopenia, unspecified: Secondary | ICD-10-CM | POA: Diagnosis present

## 2011-08-27 DIAGNOSIS — I739 Peripheral vascular disease, unspecified: Secondary | ICD-10-CM | POA: Diagnosis present

## 2011-08-27 DIAGNOSIS — Z8673 Personal history of transient ischemic attack (TIA), and cerebral infarction without residual deficits: Secondary | ICD-10-CM

## 2011-08-27 DIAGNOSIS — M8708 Idiopathic aseptic necrosis of bone, other site: Secondary | ICD-10-CM | POA: Diagnosis not present

## 2011-08-27 DIAGNOSIS — Z472 Encounter for removal of internal fixation device: Secondary | ICD-10-CM

## 2011-08-27 DIAGNOSIS — M25559 Pain in unspecified hip: Secondary | ICD-10-CM | POA: Diagnosis not present

## 2011-08-27 DIAGNOSIS — J4489 Other specified chronic obstructive pulmonary disease: Secondary | ICD-10-CM | POA: Diagnosis present

## 2011-08-27 DIAGNOSIS — I2510 Atherosclerotic heart disease of native coronary artery without angina pectoris: Secondary | ICD-10-CM | POA: Diagnosis present

## 2011-08-27 DIAGNOSIS — K224 Dyskinesia of esophagus: Secondary | ICD-10-CM | POA: Diagnosis present

## 2011-08-27 DIAGNOSIS — R1310 Dysphagia, unspecified: Secondary | ICD-10-CM | POA: Diagnosis present

## 2011-08-27 DIAGNOSIS — K5900 Constipation, unspecified: Secondary | ICD-10-CM | POA: Diagnosis present

## 2011-08-27 DIAGNOSIS — N179 Acute kidney failure, unspecified: Secondary | ICD-10-CM | POA: Diagnosis not present

## 2011-08-27 DIAGNOSIS — S79919A Unspecified injury of unspecified hip, initial encounter: Secondary | ICD-10-CM | POA: Diagnosis not present

## 2011-08-27 DIAGNOSIS — I959 Hypotension, unspecified: Secondary | ICD-10-CM | POA: Diagnosis present

## 2011-08-27 DIAGNOSIS — D62 Acute posthemorrhagic anemia: Secondary | ICD-10-CM | POA: Diagnosis not present

## 2011-08-27 DIAGNOSIS — Z96649 Presence of unspecified artificial hip joint: Secondary | ICD-10-CM | POA: Diagnosis not present

## 2011-08-27 DIAGNOSIS — I10 Essential (primary) hypertension: Secondary | ICD-10-CM | POA: Diagnosis not present

## 2011-08-27 DIAGNOSIS — M87059 Idiopathic aseptic necrosis of unspecified femur: Principal | ICD-10-CM | POA: Diagnosis present

## 2011-08-27 DIAGNOSIS — N189 Chronic kidney disease, unspecified: Secondary | ICD-10-CM | POA: Diagnosis present

## 2011-08-27 DIAGNOSIS — K219 Gastro-esophageal reflux disease without esophagitis: Secondary | ICD-10-CM | POA: Diagnosis not present

## 2011-08-27 DIAGNOSIS — Z5189 Encounter for other specified aftercare: Secondary | ICD-10-CM | POA: Diagnosis not present

## 2011-08-27 DIAGNOSIS — Z471 Aftercare following joint replacement surgery: Secondary | ICD-10-CM | POA: Diagnosis not present

## 2011-08-27 DIAGNOSIS — Z950 Presence of cardiac pacemaker: Secondary | ICD-10-CM

## 2011-08-27 DIAGNOSIS — D500 Iron deficiency anemia secondary to blood loss (chronic): Secondary | ICD-10-CM | POA: Diagnosis present

## 2011-08-27 DIAGNOSIS — M1990 Unspecified osteoarthritis, unspecified site: Secondary | ICD-10-CM | POA: Diagnosis not present

## 2011-08-27 DIAGNOSIS — E039 Hypothyroidism, unspecified: Secondary | ICD-10-CM | POA: Diagnosis not present

## 2011-08-27 DIAGNOSIS — D649 Anemia, unspecified: Secondary | ICD-10-CM | POA: Diagnosis not present

## 2011-08-27 HISTORY — PX: TOTAL HIP ARTHROPLASTY: SHX124

## 2011-08-27 SURGERY — ARTHROPLASTY, HIP, TOTAL,POSTERIOR APPROACH
Anesthesia: General | Site: Hip | Laterality: Right | Wound class: Clean

## 2011-08-27 MED ORDER — SIMVASTATIN 20 MG PO TABS
20.0000 mg | ORAL_TABLET | Freq: Every day | ORAL | Status: DC
  Administered 2011-08-28 – 2011-08-30 (×3): 20 mg via ORAL
  Filled 2011-08-27 (×6): qty 1

## 2011-08-27 MED ORDER — PROPOFOL 10 MG/ML IV EMUL
INTRAVENOUS | Status: DC | PRN
  Administered 2011-08-27: 100 mg via INTRAVENOUS

## 2011-08-27 MED ORDER — METHOCARBAMOL 100 MG/ML IJ SOLN
500.0000 mg | Freq: Four times a day (QID) | INTRAVENOUS | Status: DC | PRN
  Filled 2011-08-27: qty 5

## 2011-08-27 MED ORDER — ONDANSETRON HCL 4 MG/2ML IJ SOLN: 4.0000 mg | Freq: Once | INTRAMUSCULAR | Status: DC | PRN

## 2011-08-27 MED ORDER — CYANOCOBALAMIN 500 MCG PO TABS
500.0000 ug | ORAL_TABLET | Freq: Every day | ORAL | Status: DC
  Administered 2011-08-28 – 2011-08-31 (×4): 500 ug via ORAL
  Filled 2011-08-27 (×5): qty 1

## 2011-08-27 MED ORDER — ROCURONIUM BROMIDE 100 MG/10ML IV SOLN
INTRAVENOUS | Status: DC | PRN
  Administered 2011-08-27: 50 mg via INTRAVENOUS

## 2011-08-27 MED ORDER — PHENOL 1.4 % MT LIQD
1.0000 | OROMUCOSAL | Status: DC | PRN
  Filled 2011-08-27: qty 177

## 2011-08-27 MED ORDER — METHOCARBAMOL 500 MG PO TABS
500.0000 mg | ORAL_TABLET | Freq: Four times a day (QID) | ORAL | Status: DC | PRN
  Filled 2011-08-27: qty 1

## 2011-08-27 MED ORDER — HYDROMORPHONE HCL PF 1 MG/ML IJ SOLN: 0.2500 mg | INTRAMUSCULAR | Status: DC | PRN

## 2011-08-27 MED ORDER — DIPHENHYDRAMINE HCL 12.5 MG/5ML PO ELIX
12.5000 mg | ORAL_SOLUTION | ORAL | Status: DC | PRN
  Filled 2011-08-27: qty 10

## 2011-08-27 MED ORDER — ONDANSETRON HCL 4 MG/2ML IJ SOLN
INTRAMUSCULAR | Status: AC
  Filled 2011-08-27: qty 2

## 2011-08-27 MED ORDER — LEVOTHYROXINE SODIUM 125 MCG PO TABS
125.0000 ug | ORAL_TABLET | Freq: Every day | ORAL | Status: DC
  Administered 2011-08-28 – 2011-08-31 (×4): 125 ug via ORAL
  Filled 2011-08-27 (×6): qty 1

## 2011-08-27 MED ORDER — VITAMIN B-12 500 MCG PO TABS: 500.0000 ug | ORAL_TABLET | Freq: Every day | ORAL | Status: DC

## 2011-08-27 MED ORDER — LIDOCAINE HCL (CARDIAC) 20 MG/ML IV SOLN
INTRAVENOUS | Status: DC | PRN
  Administered 2011-08-27: 100 mg via INTRAVENOUS

## 2011-08-27 MED ORDER — NEOSTIGMINE METHYLSULFATE 1 MG/ML IJ SOLN
INTRAMUSCULAR | Status: DC | PRN
  Administered 2011-08-27: 3 mg via INTRAVENOUS

## 2011-08-27 MED ORDER — ONDANSETRON HCL 4 MG/2ML IJ SOLN
INTRAMUSCULAR | Status: DC | PRN
  Administered 2011-08-27: 4 mg via INTRAVENOUS

## 2011-08-27 MED ORDER — ACETAMINOPHEN 650 MG RE SUPP: 650.0000 mg | Freq: Four times a day (QID) | RECTAL | Status: DC | PRN

## 2011-08-27 MED ORDER — ACETAMINOPHEN 10 MG/ML IV SOLN
INTRAVENOUS | Status: AC
  Filled 2011-08-27: qty 100

## 2011-08-27 MED ORDER — ALUM & MAG HYDROXIDE-SIMETH 200-200-20 MG/5ML PO SUSP: 30.0000 mL | ORAL | Status: DC | PRN

## 2011-08-27 MED ORDER — KCL IN DEXTROSE-NACL 20-5-0.45 MEQ/L-%-% IV SOLN
INTRAVENOUS | Status: DC
  Administered 2011-08-27 – 2011-08-28 (×2): via INTRAVENOUS
  Filled 2011-08-27 (×7): qty 1000

## 2011-08-27 MED ORDER — METHOCARBAMOL 100 MG/ML IJ SOLN
500.0000 mg | INTRAMUSCULAR | Status: AC
  Administered 2011-08-27: 500 mg via INTRAVENOUS
  Filled 2011-08-27: qty 5

## 2011-08-27 MED ORDER — MAGNESIUM HYDROXIDE 400 MG/5ML PO SUSP: 30.0000 mL | Freq: Every day | ORAL | Status: DC | PRN

## 2011-08-27 MED ORDER — KCL IN DEXTROSE-NACL 20-5-0.45 MEQ/L-%-% IV SOLN
INTRAVENOUS | Status: AC
  Filled 2011-08-27: qty 1000

## 2011-08-27 MED ORDER — ACETAMINOPHEN 10 MG/ML IV SOLN
INTRAVENOUS | Status: DC | PRN
  Administered 2011-08-27: 1000 mg via INTRAVENOUS

## 2011-08-27 MED ORDER — GLYCOPYRROLATE 0.2 MG/ML IJ SOLN
INTRAMUSCULAR | Status: DC | PRN
  Administered 2011-08-27: .5 mg via INTRAVENOUS

## 2011-08-27 MED ORDER — HYDROMORPHONE HCL PF 1 MG/ML IJ SOLN
0.5000 mg | INTRAMUSCULAR | Status: DC | PRN
  Administered 2011-08-27: 0.5 mg via INTRAVENOUS
  Filled 2011-08-27: qty 1

## 2011-08-27 MED ORDER — HYDROCODONE-ACETAMINOPHEN 5-325 MG PO TABS
1.0000 | ORAL_TABLET | ORAL | Status: DC | PRN
  Administered 2011-08-28 – 2011-08-30 (×3): 1 via ORAL
  Filled 2011-08-27 (×2): qty 1
  Filled 2011-08-27: qty 2
  Filled 2011-08-27 (×2): qty 1

## 2011-08-27 MED ORDER — FLEET ENEMA 7-19 GM/118ML RE ENEM: 1.0000 | ENEMA | Freq: Once | RECTAL | Status: AC | PRN

## 2011-08-27 MED ORDER — POTASSIUM CHLORIDE CRYS ER 10 MEQ PO TBCR
10.0000 meq | EXTENDED_RELEASE_TABLET | Freq: Every day | ORAL | Status: DC
  Administered 2011-08-28: 10 meq via ORAL
  Filled 2011-08-27 (×3): qty 1

## 2011-08-27 MED ORDER — BISACODYL 5 MG PO TBEC
5.0000 mg | DELAYED_RELEASE_TABLET | Freq: Every day | ORAL | Status: DC | PRN
  Administered 2011-08-31: 5 mg via ORAL
  Filled 2011-08-27: qty 1

## 2011-08-27 MED ORDER — METOCLOPRAMIDE HCL 5 MG/ML IJ SOLN
5.0000 mg | Freq: Three times a day (TID) | INTRAMUSCULAR | Status: DC | PRN
  Filled 2011-08-27: qty 2

## 2011-08-27 MED ORDER — METOCLOPRAMIDE HCL 10 MG PO TABS
5.0000 mg | ORAL_TABLET | Freq: Three times a day (TID) | ORAL | Status: DC | PRN
  Filled 2011-08-27: qty 1

## 2011-08-27 MED ORDER — LACTATED RINGERS IV SOLN
INTRAVENOUS | Status: DC
  Administered 2011-08-27: 11:00:00 via INTRAVENOUS

## 2011-08-27 MED ORDER — AMIODARONE HCL 200 MG PO TABS
200.0000 mg | ORAL_TABLET | Freq: Every day | ORAL | Status: DC
Start: 2011-08-28 — End: 2011-08-31
  Administered 2011-08-28 – 2011-08-31 (×4): 200 mg via ORAL
  Filled 2011-08-27 (×6): qty 1

## 2011-08-27 MED ORDER — 0.9 % SODIUM CHLORIDE (POUR BTL) OPTIME
TOPICAL | Status: DC | PRN
  Administered 2011-08-27: 1000 mL

## 2011-08-27 MED ORDER — FUROSEMIDE 20 MG PO TABS
20.0000 mg | ORAL_TABLET | Freq: Every day | ORAL | Status: DC
  Administered 2011-08-27: 20 mg via ORAL
  Filled 2011-08-27: qty 1

## 2011-08-27 MED ORDER — SODIUM CHLORIDE 0.9 % IV BOLUS (SEPSIS)
1000.0000 mL | Freq: Once | INTRAVENOUS | Status: AC
  Administered 2011-08-27: 1000 mL via INTRAVENOUS

## 2011-08-27 MED ORDER — ONDANSETRON HCL 4 MG PO TABS: 4.0000 mg | ORAL_TABLET | Freq: Four times a day (QID) | ORAL | Status: DC | PRN

## 2011-08-27 MED ORDER — PHENYLEPHRINE HCL 10 MG/ML IJ SOLN
INTRAMUSCULAR | Status: DC | PRN
  Administered 2011-08-27 (×2): 40 ug via INTRAVENOUS

## 2011-08-27 MED ORDER — MENTHOL 3 MG MT LOZG: 1.0000 | LOZENGE | OROMUCOSAL | Status: DC | PRN

## 2011-08-27 MED ORDER — FAMOTIDINE 20 MG PO TABS
20.0000 mg | ORAL_TABLET | Freq: Every day | ORAL | Status: DC
  Filled 2011-08-27: qty 1

## 2011-08-27 MED ORDER — CALCIUM CARBONATE-VITAMIN D 500-200 MG-UNIT PO TABS
1.0000 | ORAL_TABLET | Freq: Every day | ORAL | Status: DC
  Administered 2011-08-28 – 2011-08-31 (×4): 1 via ORAL
  Filled 2011-08-27 (×6): qty 1

## 2011-08-27 MED ORDER — CYCLOSPORINE 0.05 % OP EMUL
1.0000 [drp] | Freq: Two times a day (BID) | OPHTHALMIC | Status: DC
  Administered 2011-08-28 – 2011-08-31 (×7): 1 [drp] via OPHTHALMIC
  Filled 2011-08-27 (×10): qty 1

## 2011-08-27 MED ORDER — BUPIVACAINE-EPINEPHRINE 0.5% -1:200000 IJ SOLN
INTRAMUSCULAR | Status: DC | PRN
  Administered 2011-08-27: 16 mL

## 2011-08-27 MED ORDER — FERROUS SULFATE 325 (65 FE) MG PO TABS
325.0000 mg | ORAL_TABLET | Freq: Every day | ORAL | Status: DC
  Administered 2011-08-28 – 2011-08-31 (×4): 325 mg via ORAL
  Filled 2011-08-27 (×5): qty 1

## 2011-08-27 MED ORDER — ONDANSETRON HCL 4 MG/2ML IJ SOLN
4.0000 mg | Freq: Four times a day (QID) | INTRAMUSCULAR | Status: DC | PRN
  Administered 2011-08-27: 4 mg via INTRAVENOUS

## 2011-08-27 MED ORDER — LACTATED RINGERS IV SOLN
INTRAVENOUS | Status: DC | PRN
  Administered 2011-08-27: 11:00:00 via INTRAVENOUS

## 2011-08-27 MED ORDER — ACETAMINOPHEN 325 MG PO TABS
650.0000 mg | ORAL_TABLET | Freq: Four times a day (QID) | ORAL | Status: DC | PRN
  Administered 2011-08-28: 650 mg via ORAL
  Filled 2011-08-27 (×2): qty 2

## 2011-08-27 MED ORDER — METOPROLOL SUCCINATE ER 25 MG PO TB24
25.0000 mg | ORAL_TABLET | Freq: Every day | ORAL | Status: DC
  Filled 2011-08-27: qty 1

## 2011-08-27 MED ORDER — CLOPIDOGREL BISULFATE 75 MG PO TABS
75.0000 mg | ORAL_TABLET | Freq: Every day | ORAL | Status: DC
  Administered 2011-08-27 – 2011-08-31 (×5): 75 mg via ORAL
  Filled 2011-08-27 (×6): qty 1

## 2011-08-27 MED ORDER — ASPIRIN EC 325 MG PO TBEC
325.0000 mg | DELAYED_RELEASE_TABLET | Freq: Two times a day (BID) | ORAL | Status: DC
  Administered 2011-08-27 – 2011-08-29 (×4): 325 mg via ORAL
  Filled 2011-08-27 (×7): qty 1

## 2011-08-27 MED ORDER — FENTANYL CITRATE 0.05 MG/ML IJ SOLN
INTRAMUSCULAR | Status: DC | PRN
  Administered 2011-08-27: 100 ug via INTRAVENOUS
  Administered 2011-08-27: 150 ug via INTRAVENOUS

## 2011-08-27 SURGICAL SUPPLY — 63 items
BLADE SAW SAG 73X25 THK (BLADE) ×1
BLADE SAW SGTL 18X1.27X75 (BLADE) IMPLANT
BLADE SAW SGTL 73X25 THK (BLADE) ×1 IMPLANT
BLADE SAW SGTL MED 73X18.5 STR (BLADE) IMPLANT
BRUSH FEMORAL CANAL (MISCELLANEOUS) IMPLANT
CHLORAPREP W/TINT 26ML (MISCELLANEOUS) ×1 IMPLANT
CLOTH BEACON ORANGE TIMEOUT ST (SAFETY) ×2 IMPLANT
COVER BACK TABLE 24X17X13 BIG (DRAPES) IMPLANT
COVER SURGICAL LIGHT HANDLE (MISCELLANEOUS) ×3 IMPLANT
DRAPE INCISE 23X17 IOBAN STRL (DRAPES) ×1
DRAPE INCISE 23X17 STRL (DRAPES) IMPLANT
DRAPE INCISE IOBAN 23X17 STRL (DRAPES) ×1 IMPLANT
DRAPE ORTHO SPLIT 77X108 STRL (DRAPES) ×2
DRAPE PROXIMA HALF (DRAPES) ×3 IMPLANT
DRAPE SURG ORHT 6 SPLT 77X108 (DRAPES) ×1 IMPLANT
DRAPE U-SHAPE 47X51 STRL (DRAPES) ×2 IMPLANT
DRILL BIT 7/64X5 (BIT) ×2 IMPLANT
DRSG MEPILEX BORDER 4X12 (GAUZE/BANDAGES/DRESSINGS) ×3 IMPLANT
DRSG MEPILEX BORDER 4X4 (GAUZE/BANDAGES/DRESSINGS) ×1 IMPLANT
DRSG MEPILEX BORDER 4X8 (GAUZE/BANDAGES/DRESSINGS) ×1 IMPLANT
DURAPREP 26ML APPLICATOR (WOUND CARE) ×1 IMPLANT
ELECT BLADE 4.0 EZ CLEAN MEGAD (MISCELLANEOUS) ×2
ELECT REM PT RETURN 9FT ADLT (ELECTROSURGICAL) ×2
ELECTRODE BLDE 4.0 EZ CLN MEGD (MISCELLANEOUS) IMPLANT
ELECTRODE REM PT RTRN 9FT ADLT (ELECTROSURGICAL) ×1 IMPLANT
GAUZE XEROFORM 1X8 LF (GAUZE/BANDAGES/DRESSINGS) ×2 IMPLANT
GLOVE BIO SURGEON STRL SZ7 (GLOVE) ×2 IMPLANT
GLOVE BIO SURGEON STRL SZ7.5 (GLOVE) ×2 IMPLANT
GLOVE BIO SURGEON STRL SZ8.5 (GLOVE) ×1 IMPLANT
GLOVE BIOGEL PI IND STRL 7.0 (GLOVE) ×1 IMPLANT
GLOVE BIOGEL PI IND STRL 8 (GLOVE) ×1 IMPLANT
GLOVE BIOGEL PI INDICATOR 7.0 (GLOVE) ×2
GLOVE BIOGEL PI INDICATOR 8 (GLOVE) ×1
GLOVE SURG SS PI 6.5 STRL IVOR (GLOVE) ×1 IMPLANT
GLOVE SURG SS PI 8.5 STRL IVOR (GLOVE) ×1
GLOVE SURG SS PI 8.5 STRL STRW (GLOVE) IMPLANT
GOWN PREVENTION PLUS XLARGE (GOWN DISPOSABLE) ×2 IMPLANT
GOWN STRL NON-REIN LRG LVL3 (GOWN DISPOSABLE) ×4 IMPLANT
HANDPIECE INTERPULSE COAX TIP (DISPOSABLE)
HOOD PEEL AWAY FACE SHEILD DIS (HOOD) ×4 IMPLANT
KIT BASIN OR (CUSTOM PROCEDURE TRAY) ×2 IMPLANT
KIT ROOM TURNOVER OR (KITS) ×2 IMPLANT
MANIFOLD NEPTUNE II (INSTRUMENTS) ×2 IMPLANT
NEEDLE 22X1 1/2 (OR ONLY) (NEEDLE) ×2 IMPLANT
NS IRRIG 1000ML POUR BTL (IV SOLUTION) ×2 IMPLANT
PACK TOTAL JOINT (CUSTOM PROCEDURE TRAY) ×2 IMPLANT
PAD ARMBOARD 7.5X6 YLW CONV (MISCELLANEOUS) ×4 IMPLANT
PASSER SUT SWANSON 36MM LOOP (INSTRUMENTS) ×2 IMPLANT
PRESSURIZER FEMORAL UNIV (MISCELLANEOUS) IMPLANT
SET HNDPC FAN SPRY TIP SCT (DISPOSABLE) IMPLANT
SUT ETHIBOND 2 V 37 (SUTURE) ×2 IMPLANT
SUT ETHILON 3 0 FSL (SUTURE) ×1 IMPLANT
SUT ETHILON 3 0 PS 1 (SUTURE) ×2 IMPLANT
SUT VIC AB 0 CTB1 27 (SUTURE) ×2 IMPLANT
SUT VIC AB 1 CTX 36 (SUTURE) ×2
SUT VIC AB 1 CTX36XBRD ANBCTR (SUTURE) ×1 IMPLANT
SUT VIC AB 2-0 CTB1 (SUTURE) ×2 IMPLANT
SYR CONTROL 10ML LL (SYRINGE) ×2 IMPLANT
TOWEL OR 17X24 6PK STRL BLUE (TOWEL DISPOSABLE) ×2 IMPLANT
TOWEL OR 17X26 10 PK STRL BLUE (TOWEL DISPOSABLE) ×2 IMPLANT
TOWER CARTRIDGE SMART MIX (DISPOSABLE) IMPLANT
TRAY FOLEY CATH 14FR (SET/KITS/TRAYS/PACK) ×1 IMPLANT
WATER STERILE IRR 1000ML POUR (IV SOLUTION) ×5 IMPLANT

## 2011-08-27 NOTE — Transfer of Care (Signed)
Immediate Anesthesia Transfer of Care Note  Patient: [name]  Procedure(s) Performed: Procedure(s) (LRB): TOTAL HIP ARTHROPLASTY (Right)  Patient Location: PACU  Anesthesia Type: General  Level of Consciousness: responds to stimulation and Patient remains intubated per anesthesia plan; pt opens eyes to verbal/tactile stim  Airway & Oxygen Therapy: Patient Spontanous Breathing and Patient remains intubated per anesthesia plan  Post-op Assessment: Report given to PACU RN and Post -op Vital signs reviewed and stable  Post vital signs: Reviewed and stable  Complications: No apparent anesthesia complications

## 2011-08-27 NOTE — Progress Notes (Signed)
Pt remains too sedated to extubate and is not resisting ventilation at this point.  Will follow and hopefully wean in PACU.

## 2011-08-27 NOTE — Op Note (Signed)
OPERATIVE REPORT    DATE OF PROCEDURE:  08/27/2011       PREOPERATIVE DIAGNOSIS:  RIGHT HIP AVASCULAR NECROSIS W/ RETAINED HARDWARE                                                          POSTOPERATIVE DIAGNOSIS:  RIGHT HIP AVASCULAR NECROSIS W/ RETAINED HARDWARE                                                           PROCEDURE:  Removal of DePuy trochanteric nail,R total hip arthroplasty using a 52 mm DePuy Pinnacle  Cup, [employer], 10-degree polyethylene liner index superior  and posterior, a +0 36 mm metal head, a [id number] SROM stem, 20Dsm Cone   SURGEON: Odelle Kosier J    ASSISTANT:   Mauricia Area, PA-C  (present throughout entire procedure and necessary for timely completion of the procedure)   ANESTHESIA: General BLOOD LOSS: 300 FLUID REPLACEMENT: 1400 crystalloid DRAINS: Foley Catheter URINE OUTPUT: 300cc COMPLICATIONS: none    INDICATIONS FOR PROCEDURE: A 78 y.o. year-old With  RIGHT HIP AVASCULAR NECROSIS W/ RETAINED HARDWARE   for 4 years, x-rays show bone-on-bone arthritic changes, the tip of the nail is starting to go into the acetabulum.[name] Despite conservative measures with observation, anti-inflammatory medicine, narcotics, use of a cane, has severe unremitting pain and can ambulate only a few blocks before resting.  Patient desires elective removal of the trochanteric nail and then the right total hip arthroplasty to decrease pain and increase function. The risks, benefits, and alternatives were discussed at length including but not limited to the risks of infection, bleeding, nerve injury, stiffness, blood clots, the need for revision surgery, cardiopulmonary complications, among others, and they were willing to proceed.y have been discussed. Questions answered.     PROCEDURE IN DETAIL: The patient was identified by armband,  received preoperative IV antibiotics in the holding area at [facility], taken to the operating room , appropriate  anesthetic monitors  were attached and general endotracheal anesthesia induced. Foley catheter was inserted. She was rolled into the L lateral decubitus position and fixed there with a Stulberg Mark II pelvic clamp and the R lower extremity was then prepped and draped  in the usual sterile fashion from the ankle to the hemipelvis. A time-out  procedure was performed. The skin along the lateral hip and thigh  infiltrated with 10 mL of 0.5% Marcaine and epinephrine solution. We  then made a posterolateral approach to the hip. With a #10 blade, 15 cm  incision through skin and subcutaneous tissue down to the level of the  IT band. Small bleeders were identified and cauterized. IT band cut in  line with skin incision exposing the greater trochanter. We immediately identified the bowl going through the proximal metaphyseal flare and removed it with the DePuy bolts screwdriver. Palpating along the vastus lateralis we then identified the distal screws and a second lateral incision 5 cm in length the skin and subcutaneous tissue also to the IT band and splitting the vastus lateralis allowing [place] to remove the 2 distal locking screws the  short trochanteric nail. At this point  A Cobra retractor was placed between the gluteus minimus and the superior hip joint capsule, and a spiked Cobra between the quadratus femoris and the inferior hip joint capsule. This isolated the short  external rotators and piriformis tendons. These were tagged with a #2 Ethibond  suture and cut off their insertion on the intertrochanteric crest. The posterior  capsule was then developed into an acetabular-based flap from Posterior Superior off of the acetabulum out over the femoral neck and back posterior inferior to the acetabular rim. This flap was tagged with two #2 Ethibond sutures and retracted protecting the sciatic nerve. This exposed the arthritic femoral head and osteophytes. The hip was then flexed and internally rotated,  dislocating the femoral head and a standard neck cut performed 1 fingerbreadth above the lesser trochanter.  At this time we identified the proximal tip of the nail and gauge it with the slap hammer to remove the short trochanteric nail without difficulty. A spiked Cobra was placed in the cotyloid notch and a Hohmann retractor was then used to lever the femur anteriorly off of the anterior pelvic column. A posterior-inferior wing retractor was placed at the junction of the acetabulum and the ischium completing the acetabular exposure.We then removed the peripheral osteophytes and labrum from the acetabulum. We then reamed the acetabulum up to 51 mm with basket reamers obtaining good coverage in all quadrants, irrigated out with normal  saline solution and hammered into place a 52 mm pinnacle cup in 45  degrees of abduction and about 20 degrees of anteversion. More  peripheral osteophytes removed and a trial 10-degree liner placed with the  index superior-posterior. The hip was then flexed and internally rotated exposing the  proximal femur, which was entered with the initiating reamer followed by  the axial reamers up to a 15.5 mm full depth and 16mm partial depth. We then conically reamed to 20D to the correct depth for a 42 base neck. The calcar was milled to 20Dsm. A trial cone and stem was inserted in the 25 degrees anteversion, with a +0 36mm trial head. Trial reduction was then performed and excellent stability was noted with at 90 of flexion with 80 of internal rotation and then full extension with maximal external rotation. The hip could not be dislocated in full extension. The knee could easily flex  to about 130 degrees. We also stretched the abductors at this point,  because of the preexisting adductor contractures. All trial components  were then removed. The acetabulum was irrigated out with normal saline  solution. A titanium Apex [facility] was then screwed into place  followed by a  10-degree polyethylene liner index superior-posterior. On  the femoral side a 20Dsm ZTT1 cone was hammered into place, followed by a [phone] SROM stem in 25 degrees of anteversion. At this point, a +0 36 mm metal head was  hammered on the stem. The hip was reduced. We checked our stability  one more time and found to be excellent. The wound was once again  thoroughly irrigated out with normal saline solution pulse lavage. The  capsular flap and short external rotators were repaired back to the  intertrochanteric crest through drill holes with a #2 Ethibond suture.  The IT band was closed with running 1 Vicryl suture. The subcutaneous  tissue with 0 and 2-0 undyed Vicryl suture and the skin with running  interlocking 3-0 nylon suture. Dressing of Xeroform and Mepilex was  then applied. The  patient was then unclamped, rolled supine, awaken extubated and taken to recovery room without difficulty in stable condition.   Nestor Lewandowsky 08/27/2011, 12:58 PM

## 2011-08-27 NOTE — Anesthesia Postprocedure Evaluation (Signed)
  Anesthesia Post-op Note  Patient: [name]  Procedure(s) Performed: Procedure(s) (LRB): TOTAL HIP ARTHROPLASTY (Right)  Patient Location: PACU  Anesthesia Type: General  Level of Consciousness: awake and alert   Airway and Oxygen Therapy: Patient Spontanous Breathing and Patient connected to nasal cannula oxygen  Post-op Pain: mild  Post-op Assessment: Post-op Vital signs reviewed  Post-op Vital Signs: Reviewed  Complications: Pt extubated without difficulty per RT note. Doing well

## 2011-08-27 NOTE — Progress Notes (Signed)
Pt placed on ventilator @1408  - Settings per CRNA Dustin Flock) were Volume control, 40% 5 peep rr 7 and VT of 450.  BBS confirmed.  Ett holder placed.  Patient has 7.5 Ett that is 22 ATL.  Patients HR 70 and Sats are 100%.  Vte 435 Total rr 11 Ve 4.7 Peak pressure  20 Plateau press 22 Mean 8

## 2011-08-27 NOTE — Interval H&P Note (Signed)
History and Physical Interval Note:  08/27/2011 11:36 AM  [name]  has presented today for surgery, with the diagnosis of RIGHT HIP AVASCULAR NECROSIS W/ RETAINED HARDWARE  The various methods of treatment have been discussed with the patient and family. After consideration of risks, benefits and other options for treatment, the patient has consented to  Procedure(s) (LRB): TOTAL HIP ARTHROPLASTY (Right) as a surgical intervention .  The patients' history has been reviewed, patient examined, no change in status, stable for surgery.  I have reviewed the patients' chart and labs.  Questions were answered to the patient's satisfaction.     Nestor Lewandowsky

## 2011-08-27 NOTE — Progress Notes (Signed)
DR.CREWS CALLED INFORMED PT RAISING HEAD OFF BED AND RAISING LEFT FOOT TC ORDERS TO PUT ON CPAP AT 100% RESPIRATORY CALLED

## 2011-08-27 NOTE — Progress Notes (Signed)
,   PT CLOSES OU WHEN ASKED IF HAVING PAIN . DR. Ivin Booty SAID MAY HAVE A SMALL AMOUNT OF PAIN MED. STARTED ROBAXIN. WHEN ASKED TO RAISE HEAD RAISES IT A LITTLE OFF THE PILLOW. SQUEEZES HAND TC AND WIGGLES TOES  TC AND RAISES LEFT FOOT TC.

## 2011-08-27 NOTE — Progress Notes (Signed)
Pt extubated per MD to 8L Simple mask at 1525 - no stridor, decreased BBS, hr 70, Sats 100%, rr 12.  No distress.

## 2011-08-27 NOTE — Anesthesia Preprocedure Evaluation (Addendum)
Anesthesia Evaluation  Patient identified by MRN, date of birth, ID band Patient awake    Reviewed: Allergy & Precautions, H&P , NPO status , Patient's Chart, lab work & pertinent test results, reviewed documented beta blocker date and time   Airway Mallampati: II TM Distance: >3 FB Neck ROM: Full    Dental  (+) Upper Dentures, Lower Dentures and Dental Advisory Given   Pulmonary asthma , COPD breath sounds clear to auscultation        Cardiovascular hypertension, Pt. on medications and Pt. on home beta blockers + CAD and + Past MI + dysrhythmias Atrial Fibrillation + pacemaker Rhythm:Regular Rate:Normal  Cardiac Clearance by Dr Alanda Amass   Neuro/Psych    GI/Hepatic Neg liver ROS, hiatal hernia, GERD-  Medicated,  Endo/Other  Hypothyroidism   Renal/GU negative Renal ROS     Musculoskeletal  (+) Arthritis -,   Abdominal Normal abdominal exam  (+)   Peds  Hematology negative hematology ROS (+)   Anesthesia Other Findings   Reproductive/Obstetrics                          Anesthesia Physical Anesthesia Plan  ASA: III  Anesthesia Plan: General   Post-op Pain Management:    Induction: Intravenous  Airway Management Planned: Oral ETT  Additional Equipment:   Intra-op Plan:   Post-operative Plan: Extubation in OR  Informed Consent: I have reviewed the patients History and Physical, chart, labs and discussed the procedure including the risks, benefits and alternatives for the proposed anesthesia with the patient or authorized representative who has indicated his/her understanding and acceptance.   Dental advisory given  Plan Discussed with: CRNA  Anesthesia Plan Comments:        Anesthesia Quick Evaluation

## 2011-08-27 NOTE — Preoperative (Signed)
Beta Blockers   Reason not to administer Beta Blockers:Not Applicable, pt took Metoprolol at 0830

## 2011-08-28 ENCOUNTER — Encounter (HOSPITAL_COMMUNITY): Payer: Self-pay | Admitting: Internal Medicine

## 2011-08-28 DIAGNOSIS — D696 Thrombocytopenia, unspecified: Secondary | ICD-10-CM | POA: Diagnosis present

## 2011-08-28 DIAGNOSIS — Z96649 Presence of unspecified artificial hip joint: Secondary | ICD-10-CM

## 2011-08-28 DIAGNOSIS — E039 Hypothyroidism, unspecified: Secondary | ICD-10-CM | POA: Diagnosis present

## 2011-08-28 DIAGNOSIS — B9689 Other specified bacterial agents as the cause of diseases classified elsewhere: Secondary | ICD-10-CM | POA: Diagnosis present

## 2011-08-28 DIAGNOSIS — N390 Urinary tract infection, site not specified: Secondary | ICD-10-CM

## 2011-08-28 DIAGNOSIS — I959 Hypotension, unspecified: Secondary | ICD-10-CM | POA: Diagnosis not present

## 2011-08-28 DIAGNOSIS — I2510 Atherosclerotic heart disease of native coronary artery without angina pectoris: Secondary | ICD-10-CM | POA: Diagnosis present

## 2011-08-28 DIAGNOSIS — N179 Acute kidney failure, unspecified: Secondary | ICD-10-CM | POA: Diagnosis not present

## 2011-08-28 DIAGNOSIS — D649 Anemia, unspecified: Secondary | ICD-10-CM

## 2011-08-28 DIAGNOSIS — M87059 Idiopathic aseptic necrosis of unspecified femur: Secondary | ICD-10-CM | POA: Diagnosis not present

## 2011-08-28 DIAGNOSIS — D62 Acute posthemorrhagic anemia: Secondary | ICD-10-CM | POA: Diagnosis not present

## 2011-08-28 LAB — CBC
Hemoglobin: 8.6 g/dL — ABNORMAL LOW (ref 12.0–15.0)
Hemoglobin: 8.6 g/dL — ABNORMAL LOW (ref 12.0–15.0)
MCH: 32.6 pg (ref 26.0–34.0)
MCV: 101.1 fL — ABNORMAL HIGH (ref 78.0–100.0)
RBC: 2.64 MIL/uL — ABNORMAL LOW (ref 3.87–5.11)
RBC: 2.74 MIL/uL — ABNORMAL LOW (ref 3.87–5.11)

## 2011-08-28 LAB — BASIC METABOLIC PANEL
CO2: 26 mEq/L (ref 19–32)
Chloride: 104 mEq/L (ref 96–112)
Glucose, Bld: 170 mg/dL — ABNORMAL HIGH (ref 70–99)
Potassium: 4.8 mEq/L (ref 3.5–5.1)
Sodium: 139 mEq/L (ref 135–145)

## 2011-08-28 LAB — HEMOGLOBIN AND HEMATOCRIT, BLOOD
HCT: 31 % — ABNORMAL LOW (ref 36.0–46.0)
Hemoglobin: 10.2 g/dL — ABNORMAL LOW (ref 12.0–15.0)

## 2011-08-28 LAB — CARDIAC PANEL(CRET KIN+CKTOT+MB+TROPI): CK, MB: 6.9 ng/mL (ref 0.3–4.0)

## 2011-08-28 LAB — DIFFERENTIAL
Eosinophils Absolute: 0 10*3/uL (ref 0.0–0.7)
Eosinophils Relative: 0 % (ref 0–5)
Lymphs Abs: 1.4 10*3/uL (ref 0.7–4.0)
Monocytes Relative: 9 % (ref 3–12)

## 2011-08-28 LAB — PREPARE RBC (CROSSMATCH)

## 2011-08-28 MED ORDER — SODIUM CHLORIDE 0.9 % IV SOLN
INTRAVENOUS | Status: DC
  Administered 2011-08-28 – 2011-08-29 (×3): via INTRAVENOUS

## 2011-08-28 MED ORDER — SODIUM CHLORIDE 0.9 % IV BOLUS (SEPSIS)
250.0000 mL | Freq: Once | INTRAVENOUS | Status: AC
  Administered 2011-08-28: 250 mL via INTRAVENOUS

## 2011-08-28 MED ORDER — SODIUM CHLORIDE 0.9 % IJ SOLN
INTRAMUSCULAR | Status: AC
  Administered 2011-08-28: 10 mL
  Filled 2011-08-28: qty 10

## 2011-08-28 MED ORDER — FUROSEMIDE 20 MG PO TABS
20.0000 mg | ORAL_TABLET | Freq: Every day | ORAL | Status: DC
  Administered 2011-08-28: 20 mg via ORAL
  Filled 2011-08-28 (×2): qty 1

## 2011-08-28 MED ORDER — ACETAMINOPHEN 325 MG PO TABS
650.0000 mg | ORAL_TABLET | Freq: Once | ORAL | Status: AC
  Administered 2011-08-28: 650 mg via ORAL
  Filled 2011-08-28: qty 2

## 2011-08-28 MED ORDER — PANTOPRAZOLE SODIUM 40 MG IV SOLR
40.0000 mg | Freq: Every day | INTRAVENOUS | Status: DC
  Administered 2011-08-28: 40 mg via INTRAVENOUS
  Filled 2011-08-28 (×2): qty 40

## 2011-08-28 MED ORDER — SODIUM CHLORIDE 0.9 % IV SOLN
Freq: Once | INTRAVENOUS | Status: AC
  Administered 2011-08-28: via INTRAVENOUS

## 2011-08-28 MED ORDER — HYDROMORPHONE HCL PF 1 MG/ML IJ SOLN: 0.5000 mg | INTRAMUSCULAR | Status: DC | PRN

## 2011-08-28 MED ORDER — DEXTROSE 5 % IV SOLN
1.0000 g | Freq: Every day | INTRAVENOUS | Status: DC
  Administered 2011-08-28 – 2011-08-30 (×4): 1 g via INTRAVENOUS
  Filled 2011-08-28 (×6): qty 10

## 2011-08-28 MED ORDER — FENTANYL CITRATE 0.05 MG/ML IJ SOLN: 25.0000 ug | INTRAMUSCULAR | Status: DC | PRN

## 2011-08-28 MED ORDER — PANTOPRAZOLE SODIUM 40 MG PO TBEC
40.0000 mg | DELAYED_RELEASE_TABLET | Freq: Every day | ORAL | Status: DC
  Administered 2011-08-28 – 2011-08-31 (×4): 40 mg via ORAL
  Filled 2011-08-28 (×4): qty 1

## 2011-08-28 MED ORDER — METOPROLOL SUCCINATE ER 25 MG PO TB24
25.0000 mg | ORAL_TABLET | Freq: Every day | ORAL | Status: DC
  Administered 2011-08-31: 25 mg via ORAL
  Filled 2011-08-28 (×5): qty 1

## 2011-08-28 NOTE — Progress Notes (Signed)
Pt transferred from 5000 via bed with RN and tech. Placed on our monitor, VVS and pt alert and awake at this time. Will continue to monitor.  Leta Baptist RN

## 2011-08-28 NOTE — Progress Notes (Signed)
Notified by patient's nurse that BP was low at around 80s over 50s.  Patient noted to deny chest pain or shortness of breath.  1 liter of 0.9% NS administered and repeat BP noted to be 85/30.  Patient noted to have CAD s/p MI, afib.    Plan currently is to immediately obtain stat medicine consult, obtain EKG, and cardiac enzymes and transfer patient to stepdown unit with close monitoring.

## 2011-08-28 NOTE — Progress Notes (Signed)
Pt urine output low after foley D/C, pt bladder scanned with an amount of 50 cc at highest scan. Dr Sharon Seller alredy aware of pt's urine output see note of today at 1648 will continue to monitor

## 2011-08-28 NOTE — Progress Notes (Signed)
MD made aware of low urine out put, new orders received for 250 cc bolus and mantanice rate change to 100cc/hr NS. Will continue to monitor.

## 2011-08-28 NOTE — Progress Notes (Signed)
Pt BP 84/30's-40's, still no urine output, otherwise vs stable, pt alert and oriented, denies dizziness or fatigue. M lynch NP was called and ordered 500 cc bolus.  Also spoke with Dr Patricia Pesa  Ccm will continue to monitor.

## 2011-08-28 NOTE — Progress Notes (Signed)
[facility] [place] TEAM 1 - Stepdown/ICU TEAM  CONSULT F/U NOTE  PCP:  Herb Grays, MD  Subjective: 78 y.o. female who underwent removal of L hip hardware with revision and THA on 08/27/2011. She began to have hypotension in the late evening and early AM of 6/14-15/2013.  She denied any chest pain or SOB, but she did report having weakness and dizziness. Her blood pressure was found to be 85/ 30 and previously had been 137/84. She was given a 1 liter of NSS bolus without improvement in her blood pressure. Cardiac Enzymes were ordered and an EKG. Her EKG was without changes from the previous EKG on 08/18/2011.  Pt is seen for a f/u visit.  Objective:  Intake/Output Summary (Last 24 hours) at 08/28/11 1558 Last data filed at 08/28/11 0737  Gross per 24 hour  Intake  997.5 ml  Output    275 ml  Net  722.5 ml   Blood pressure 103/40, pulse 70, temperature 98 F (36.7 C), temperature source Oral, resp. rate 16, height 5\' 3"  (1.6 m), weight 58.968 kg (130 lb), SpO2 100.00%.  Physical Exam: F/U exam completed  Lab Results:  [facility] 08/28/11 0438  NA 139  K 4.8  CL 104  CO2 26  GLUCOSE 170*  BUN 19  CREATININE 1.48*  CALCIUM 8.0*  MG --  PHOS --    Basename 08/28/11 1252 08/28/11 0438 08/28/11 0111  WBC -- 14.7* 16.9*  NEUTROABS -- -- 14.1*  HGB 10.2* 8.6* 8.6*  HCT 31.0* 27.9* 26.7*  MCV -- 101.8* 101.1*  PLT -- 128* 126*    Basename 08/28/11 0111  CKTOTAL 349*  CKMB 6.9*  CKMBINDEX --  TROPONINI <0.30   Studies/Results: All recent x-ray/radiology reports have been reviewed in detail.   Medications: I have reviewed the patient's complete medication list.  Assessment/Plan:  Hypotension w/ Hx of HTN Appears to be volume depleted - cont to hydrate gently and follow  RIGHT HIP AVASCULAR NECROSIS W/ RETAINED HARDWARE Per primary team  CAD S/p stenting of LAD, circ, and RCA  - chronic plavix tx  PAfib  S/p pacer  Hx of  TIA  COPD  Chronic renal failure Baseline crt appears to be ~1.24-1.6  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 / chronic blood loss anemia  periph vasc disease carotid artery stenosis status post PCI on the right - abdominal abdominal aortic aneurysm - renal artery stenosis, 70% on the left  Dispo [facility] will continue to follow along with you in a consultative role  Lonia Blood, MD [facility] Office  [phone] Pager [phone]  On-Call/Text Page:      [email or web address]      password [facility]

## 2011-08-28 NOTE — Consult Note (Addendum)
DATE OF ADMISSION:  08/28/2011  ORTHOPEDIC SURGEON:  DR. Marissa Nestle  PCP:   Herb Grays, MD   REASON FOR CONSULT:  Decreased Blood Pressure    This is a Medical consultation that was requested by Dr. Marissa Nestle for [name] a 78 y.o. female who underwent Removal of Left Hip hardware with revision and Total Hip Arthroplasty on 08/27/2011.  She began to have hypotension in the late evening and early AM.  She denied any chest pain or SOB, but she did report having weakness and dizziness.    Her blood pressure was found to be 85/ 30 and previously had been 137/84.  She was given a 1 liter of NSS bolus without improvement in her blood pressure.  Cardiac Enzymes were ordered and an EKG.  Her EKG was without changes from the previous EKG on 08/18/2011.    Past Medical History  Diagnosis Date  . GI bleed 2007  . Hypertension   . TIA (transient ischemic attack)   . Hypothyroidism   . Abdominal aortic aneurysm   . CAD (coronary artery disease)   . Pacemaker   . Myocardial infarction   . Atrial fibrillation   . COPD (chronic obstructive pulmonary disease)   . Asthma     hx  . Anemia     hx  . Blood transfusion   . UTI (lower urinary tract infection)     hx  . Renal failure, chronic   . H/O hiatal hernia   . GERD (gastroesophageal reflux disease)     Past Surgical History  Procedure Date  . Pacemaker insertion   . Coronary stent placement     x9  . Cardiac catheterization   . Insert / replace / remove pacemaker   . Fracture surgery     femur fx, /w ORIF into HIP- 2008  . Total hip arthroplasty     planned for 08/27/2011 Left    Medications:  HOME MEDS: Prior to Admission medications   Medication Sig Start Date End Date Taking? Authorizing Provider  amiodarone (PACERONE) 200 MG tablet Take 200 mg by mouth daily.     Yes Historical Provider, MD  calcium-vitamin D (OSCAL WITH D) 500-200 MG-UNIT per tablet Take 1 tablet by mouth daily.     Yes Historical Provider, MD    clopidogrel (PLAVIX) 75 MG tablet Take 75 mg by mouth daily.     Yes Historical Provider, MD  cycloSPORINE (RESTASIS) 0.05 % ophthalmic emulsion Place 1 drop into both eyes 2 (two) times daily.   Yes Historical Provider, MD  ferrous sulfate 325 (65 FE) MG tablet Take 325 mg by mouth daily with breakfast.     Yes Historical Provider, MD  furosemide (LASIX) 20 MG tablet Take 20 mg by mouth daily.    Yes Historical Provider, MD  levothyroxine (SYNTHROID, LEVOTHROID) 125 MCG tablet Take 125 mcg by mouth daily.   Yes Historical Provider, MD  metoprolol succinate (TOPROL-XL) 50 MG 24 hr tablet Take 25 mg by mouth daily. Take with or immediately following a meal.   Yes Historical Provider, MD  potassium chloride SA (K-DUR,KLOR-CON) 20 MEQ tablet Take 10 mEq by mouth daily.   Yes Historical Provider, MD  pravastatin (PRAVACHOL) 40 MG tablet Take 40 mg by mouth at bedtime.     Yes Historical Provider, MD  ranitidine (ZANTAC) 300 MG tablet Take 300 mg by mouth at bedtime.     Yes Historical Provider, MD  vitamin B-12 (CYANOCOBALAMIN) 500 MCG tablet Take 500  mcg by mouth daily.   Yes Historical Provider, MD  aspirin 81 MG tablet Take 81 mg by mouth daily.      Historical Provider, MD    Allergies:  Allergies  Allergen Reactions  . Nubain (Nalbuphine Hcl) Anaphylaxis  . Iodine Rash    Social History:   reports that she has quit smoking. Her smoking use included Cigarettes. She has never used smokeless tobacco. She reports that she does not drink alcohol or use illicit drugs.  Family History: Family History  Problem Relation Age of Onset  . Emphysema Father   . Heart disease Mother     Review of Systems:  The patient denies anorexia, fever, weight loss, vision loss, decreased hearing, hoarseness, chest pain, syncope, dyspnea on exertion, peripheral edema, balance deficits, hemoptysis, abdominal pain, melena, hematochezia, severe indigestion/heartburn, hematuria, incontinence, genital sores,  suspicious skin lesions, transient blindness, difficulty walking, depression, unusual weight change, abnormal bleeding, enlarged lymph nodes, angioedema, and breast masses.   Physical Exam:  GEN:  Pleasant Elderly 78 year old obese Caucasian female examined  and in no acute distress; cooperative with exam Filed Vitals:   08/27/11 1625 08/27/11 1630 08/27/11 1650 08/27/11 2103  BP: 96/34  111/64 85/30  Pulse: 70 70 73 74  Temp:   97.6 F (36.4 C) 98.1 F (36.7 C)  TempSrc:   Axillary Axillary  Resp: 9 16 16 17   Height:   5\' 3"  (1.6 m)   Weight:   58.968 kg (130 lb)   SpO2: 97% 98% 96% 93%   Blood pressure 85/30, pulse 74, temperature 98.1 F (36.7 C), temperature source Axillary, resp. rate 17, height 5\' 3"  (1.6 m), weight 58.968 kg (130 lb), SpO2 93.00%. PSYCH: SHe is alert and oriented x4; does not appear anxious does not appear depressed; affect is normal HEENT: Normocephalic and Atraumatic, Mucous membranes pink; PERRLA; EOM intact; Fundi:  Benign;  No scleral icterus, Nares: Patent, Oropharynx: Clear, Fair Dentition, Neck:  FROM, no cervical lymphadenopathy nor thyromegaly or carotid bruit; no JVD; Breasts:: Not examined CHEST WALL: No tenderness CHEST: Normal respiration, clear to auscultation bilaterally HEART: Regular rate and rhythm; no murmurs rubs or gallops BACK: No kyphosis or scoliosis; no CVA tenderness ABDOMEN: Positive Bowel Sounds, Scaphoid, Obese, soft non-tender; no masses, no organomegaly, no pannus; no intertriginous candida. Rectal Exam: Not done EXTREMITIES: No bone or joint deformity; age-appropriate arthropathy of the hands and knees; no cyanosis, clubbing or edema; no ulcerations. Genitalia: not examined PULSES: 2+ and symmetric SKIN: Normal hydration no rash or ulceration CNS: Cranial nerves 2-12 grossly intact no focal neurologic deficit   Labs & Imaging No results found for this or any previous visit (from the past 48 hour(s)). Dg Pelvis  Portable  08/27/2011  *RADIOLOGY REPORT*  Clinical Data: Postop right hip replacement.  PORTABLE PELVIS  Comparison: None.  Findings: Frontal portable view the pelvis at 1422 hours shows a right total hip replacement.  Bony callus in the proximal femur with previous screw tracts are noted.  Acute abdomen film from earlier today shows the upper portion of a dynamic hip screw which has been retrieved.  IMPRESSION: Status post right total hip replacement.  No evidence for immediate hardware complications.  Original Report Authenticated By: ERIC A. MANSELL, M.D.   Dg Hip Portable 1 View Right  08/27/2011  *RADIOLOGY REPORT*  Clinical Data: Right hip replacement  PORTABLE RIGHT HIP - 1 VIEW  Comparison: None.  Findings: Cross-table portable view of the right hip obtained 1431 hours  shows the femoral component to be located within the acetabular cup.  Gas within the overlying soft tissues is compatible with the immediate postoperative state.  IMPRESSION: Status post right total hip replacement without evidence for immediate hardware complications.  Original Report Authenticated By: ERIC A. MANSELL, M.D.     EKG:  Normal Sinus Rhythm NO Acute ST segment changes (Inferior Lead Q waves seen on previous EKG as well)    Assessment:      1.  Hypotension - the causes include possible acute blood loss, infection  Early sepsis from UTI, medication induced from pain medication and her antihypertensives, and rule out cardiac event.   2.  Left Hip Arthroplasty Present on Admission:  .CAD (coronary artery disease) .Hypothyroid .UTI (lower urinary tract infection) .Thrombocytopenia  Atrial Fibrillation  S/P Pacer   Plan:    Transfer to Stepdown Bed. Gentle IVFs due to Hypotension Cardiac Enzymes ordered Send Urine C+S, and Start IV Rocephin  Check CBC now and H/H checks q 12 hours  X 3 to evaluate for acute blood loss Start IV Protonix DVT prophylaxis Hold Antihypertensives, and Narcotic Pain rx due to  hypotension, and order Fentanyl for  Pain while having hypotension if needed.     Other plans as per orders.    CODE STATUS:      FULL CODE       ADDENDUM:     1.   The lab results reveal a hemoglobin of 8.6, previously the hemoglobin on 08/18/2011 was 12. 7,  Most likely due to blood loss.  A transfusion of 1 unit PRBCs has been ordered, and H/Hs will monitored for evidence of continued loss.    2.  The WBCs are also elevated and this will also be monitored, previously 6.9 on 08/18/2011 now 16.9 with a left shift.  Patient has been placed on IV rocephin to cover the UTI, and a Urine Culture has also been ordered.      Delio Slates C 08/28/2011, 1:08 AM

## 2011-08-28 NOTE — Progress Notes (Signed)
Patient with hypotension overnight, transferred to stepdown, improving. Continues to deny chest pain or dizzyness.  BP 103/40  Pulse 70  Temp 98.1 F (36.7 C) (Oral)  Resp 16  Ht 5\' 3"  (1.6 m)  Wt 58.968 kg (130 lb)  BMI 23.03 kg/m2  SpO2 100%  NVI Dressing CDI  Hg. 8.6 tp I: < 0.3  POD #1 after right THA with improving hypotension and postop anemia, does not appear to be cardiac-related  - continue close monitoring - appreciate input from Dr. Lovell Sheehan - agree with transfusion of 1 U prbc's, and obtain post-transfusion CBC - up with PT BID today - hold antihypertensives - rocephin started per Dr. Lovell Sheehan, will follow urine cultures

## 2011-08-28 NOTE — Evaluation (Signed)
Physical Therapy Evaluation Patient Details Name: [name] MRN: [id number] DOB: 1917/08/22 Today's Date: 08/28/2011 Time: 5621-3086 PT Time Calculation (min): 25 min  PT Assessment / Plan / Recommendation Clinical Impression  pt presents s/p R THRevision.  pt very pleasant and motivated to improve mobility.  Plan is for ST-SNF at D/C to maximize Independence prior to D/C to home.       PT Assessment  Patient needs continued PT services    Follow Up Recommendations  Skilled nursing facility    Barriers to Discharge None      lEquipment Recommendations  None recommended by PT    Recommendations for Other Services     Frequency Min 5X/week    Precautions / Restrictions Precautions Precautions: Posterior Hip Precaution Booklet Issued: No Restrictions Weight Bearing Restrictions: Yes RLE Weight Bearing: Weight bearing as tolerated   Pertinent Vitals/Pain Pt doesn't rate.  "It's not too bad."      Mobility  Bed Mobility Bed Mobility: Supine to Sit;Sitting - Scoot to Edge of Bed Supine to Sit: 1: +2 Total assist Supine to Sit: Patient Percentage: 40% Sitting - Scoot to Edge of Bed: 2: Max assist Details for Bed Mobility Assistance: cues for sequencing and encouragement.  cues for hip precautions.   Transfers Transfers: Sit to Stand;Stand to Sit;Stand Pivot Transfers Sit to Stand: 1: +2 Total assist;With upper extremity assist;From bed Sit to Stand: Patient Percentage: 50% Stand to Sit: 1: +2 Total assist Stand to Sit: Patient Percentage: 50% Stand Pivot Transfers: 1: +2 Total assist Stand Pivot Transfers: Patient Percentage: 40% Details for Transfer Assistance: cues for step-by-step through transfers, A to move RW.   Ambulation/Gait Ambulation/Gait Assistance: Not tested (comment) Stairs: No Wheelchair Mobility Wheelchair Mobility: No    Exercises     PT Diagnosis: Abnormality of gait;Acute pain  PT Problem List: Decreased strength;Decreased activity  tolerance;Decreased balance;Decreased mobility;Decreased knowledge of use of DME;Decreased knowledge of precautions;Pain PT Treatment Interventions: DME instruction;Gait training;Functional mobility training;Therapeutic activities;Therapeutic exercise;Balance training;Patient/family education   PT Goals Acute Rehab PT Goals PT Goal Formulation: With patient Time For Goal Achievement: 09/11/11 Potential to Achieve Goals: Good Pt will go Supine/Side to Sit: with min assist PT Goal: Supine/Side to Sit - Progress: Goal set today Pt will go Sit to Supine/Side: with min assist PT Goal: Sit to Supine/Side - Progress: Goal set today Pt will go Sit to Stand: with min assist PT Goal: Sit to Stand - Progress: Goal set today Pt will go Stand to Sit: with min assist PT Goal: Stand to Sit - Progress: Goal set today Pt will Ambulate: 51 - 150 feet;with min assist;with rolling walker PT Goal: Ambulate - Progress: Goal set today Additional Goals Additional Goal #1: pt will follow posterior hip precautions.   PT Goal: Additional Goal #1 - Progress: Goal set today  Visit Information  Last PT Received On: 08/28/11 Assistance Needed: +2    Subjective Data  Subjective: I'm ok considering.   Patient Stated Goal: Home   Prior Functioning  Home Living Lives With: Alone Available Help at Discharge: Family;Available PRN/intermittently Type of Home: Mobile home Home Access: Stairs to enter Entrance Stairs-Number of Steps: 3 Entrance Stairs-Rails: Right;Left;Can reach both Home Layout: One level Bathroom Shower/Tub: Health visitor: Standard Bathroom Accessibility: Yes How Accessible: Accessible via walker Home Adaptive Equipment: Walker - four wheeled;Straight cane (Toilet riser without rails) Prior Function Level of Independence: Independent with assistive device(s);Needs assistance Needs Assistance: Light Housekeeping;Meal Prep Driving: No Vocation:  Retired Special educational needs teacher  Communication: No difficulties    Cognition  Overall Cognitive Status: Appears within functional limits for tasks assessed/performed Arousal/Alertness: Awake/alert Orientation Level: Oriented X4 / Intact Behavior During Session: [facility] for tasks performed Cognition - Other Comments: pt HOH.      Extremity/Trunk Assessment Right Lower Extremity Assessment RLE ROM/Strength/Tone: Deficits RLE ROM/Strength/Tone Deficits: Unable to fully assess secondary to pain from Hip Revision RLE Sensation: WFL - Light Touch Left Lower Extremity Assessment LLE ROM/Strength/Tone: WFL for tasks assessed LLE Sensation: WFL - Light Touch   Balance Balance Balance Assessed: No  End of Session PT - End of Session Equipment Utilized During Treatment: Gait belt Activity Tolerance: Patient limited by fatigue;Patient limited by pain Patient left: in chair;with call bell/phone within reach Nurse Communication: Mobility status   Sunny Schlein, [place] [phone] 08/28/2011, 1:24 PM

## 2011-08-28 NOTE — Progress Notes (Signed)
Orthopedic Tech Progress Note Patient Details:  [name] 29-May-1917 [id number]  Ortho Devices Type of Ortho Device: Abduction pillow Ortho Device/Splint Location: hip abduction pillow Ortho Device/Splint Interventions: Application   Cammer, Mickie Bail 08/28/2011, 11:59 AM

## 2011-08-29 DIAGNOSIS — N390 Urinary tract infection, site not specified: Secondary | ICD-10-CM | POA: Diagnosis not present

## 2011-08-29 DIAGNOSIS — I959 Hypotension, unspecified: Secondary | ICD-10-CM | POA: Diagnosis not present

## 2011-08-29 DIAGNOSIS — D649 Anemia, unspecified: Secondary | ICD-10-CM

## 2011-08-29 DIAGNOSIS — Z96649 Presence of unspecified artificial hip joint: Secondary | ICD-10-CM

## 2011-08-29 LAB — BASIC METABOLIC PANEL
BUN: 22 mg/dL (ref 6–23)
GFR calc Af Amer: 26 mL/min — ABNORMAL LOW (ref >90)
GFR calc non Af Amer: 22 mL/min — ABNORMAL LOW (ref >90)
Potassium: 4 mEq/L (ref 3.5–5.1)
Sodium: 137 mEq/L (ref 135–145)

## 2011-08-29 LAB — URINE CULTURE: Culture: NO GROWTH

## 2011-08-29 LAB — CBC
MCHC: 33.3 g/dL (ref 30.0–36.0)
Platelets: 97 10*3/uL — ABNORMAL LOW (ref 150–400)
RDW: 15.2 % (ref 11.5–15.5)

## 2011-08-29 MED ORDER — ASPIRIN EC 81 MG PO TBEC
81.0000 mg | DELAYED_RELEASE_TABLET | Freq: Two times a day (BID) | ORAL | Status: DC
  Administered 2011-08-29 – 2011-08-30 (×3): 81 mg via ORAL
  Filled 2011-08-29 (×6): qty 1

## 2011-08-29 MED ORDER — POLYETHYLENE GLYCOL 3350 17 G PO PACK
17.0000 g | PACK | Freq: Every day | ORAL | Status: DC
  Administered 2011-08-29 – 2011-08-31 (×3): 17 g via ORAL
  Filled 2011-08-29 (×3): qty 1

## 2011-08-29 NOTE — Consult Note (Signed)
[facility] [place] TEAM 1 - Stepdown/ICU TEAM  CONSULT F/U NOTE  PCP:  Herb Grays, MD  Subjective: 78 y.o. female who underwent removal of L hip hardware with revision and THA on 08/27/2011. She began to have hypotension in the late evening and early AM of 6/14-15/2013.  She denied any chest pain or SOB, but she did report having weakness and dizziness. Her blood pressure was found to be 85/30 and previously had been 137/84. She was given a 1 liter of NSS bolus without improvement in her blood pressure. Cardiac Enzymes were ordered and an EKG. Her EKG was without changes from the previous EKG on 08/18/2011.  The patient is much more alert today.  She denies chest pain fevers chills nausea or vomiting.  She does have a chronic history of dysphagia and reports her symptoms seem to be somewhat worse than per her baseline.    Objective:  Intake/Output Summary (Last 24 hours) at 08/29/11 1442 Last data filed at 08/29/11 1145  Gross per 24 hour  Intake 2872.5 ml  Output    550 ml  Net 2322.5 ml   Blood pressure 108/70, pulse 70, temperature 98 F (36.7 C), temperature source Oral, resp. rate 18, height 5\' 3"  (1.6 m), weight 58.968 kg (130 lb), SpO2 93.00%.  Physical Exam: General: No acute respiratory distress - alert and conversant Lungs: Clear to auscultation bilaterally without wheezes or crackles Cardiovascular: Regular rate and rhythm without murmur gallop or rub normal S1 and S2 Abdomen: Nontender, nondistended, soft, bowel sounds positive, no rebound, no ascites, no appreciable mass Extremities: No significant cyanosis, clubbing, or edema bilateral lower extremities - right hip wound dressed and dry with no evidence of significant blood loss - no flank ecchymosis appreciable bilaterally  Lab Results:  Basename 08/29/11 0400 08/28/11 0438  NA 137 139  K 4.0 4.8  CL 104 104  CO2 23 26  GLUCOSE 92 170*  BUN 22 19  CREATININE 1.88* 1.48*  CALCIUM 7.6* 8.0*  MG --  --  PHOS -- --    Basename 08/29/11 0400 08/28/11 1252 08/28/11 0438 08/28/11 0111  WBC 9.7 -- 14.7* 16.9*  NEUTROABS -- -- -- 14.1*  HGB 8.2* 10.2* 8.6* --  HCT 24.6* 31.0* 27.9* --  MCV 96.5 -- 101.8* 101.1*  PLT 97* -- 128* 126*    Basename 08/28/11 0111  CKTOTAL 349*  CKMB 6.9*  CKMBINDEX --  TROPONINI <0.30   Studies/Results: All recent x-ray/radiology reports have been reviewed in detail.   Medications: I have reviewed the patient's complete medication list.  Assessment/Plan:  Hypotension w/ Hx of HTN Has improved with administration of colloid and crystalloid - continue to follow with further transfusion today  Postop anemia Baseline hemoglobin was 12.7 preop - hemoglobin declined into the 8 range, the patient was transfused, and then the patient declined again with concomitant volume expansion - it's not clear if the patient is continuing to bleed or this is simply reflective of dilution - we'll follow CBCs - there is no outward evidence of significant blood loss on physical exam  RIGHT HIP AVASCULAR NECROSIS W/ RETAINED HARDWARE Per primary team  CAD S/p stenting of LAD, circ, and RCA  - chronic plavix tx and aspirin - keep hemoglobin greater than or equal to 8  PAfib Currently in normal sinus rhythm  S/p pacer  Hx of TIA  COPD Well compensated at present  Acute exacerbation of Chronic renal failure Baseline crt appears to be ~1.24-1.6 - creatinine has  increased notably during episode of hypotension and volume depletion - continue to follow with volume expansion  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 / chronic blood loss anemia Will stimulate bowels and guaiac stool to assure the patient is not suffering with an occult GI bleed  periph vasc disease carotid artery stenosis status post PCI on the right - abdominal abdominal aortic aneurysm - renal artery stenosis, 70% on the left  Dysphasia I have confirmed this is a chronic problem for this patient - I.  suspect her current symptoms may be due to an acute exacerbation of a chronic problem possibly related to intubation for her surgical procedure - we will follow - a recent GI evaluation proved that she had esophageal dysmotility and there is no effective safe treatment for this elderly patient  Dispo [facility] will continue to follow along with you in a consultative role  Lonia Blood, MD [facility] Office  [phone] Pager [phone]  On-Call/Text Page:      [email or web address]      password [facility]

## 2011-08-29 NOTE — Progress Notes (Signed)
Pt  BP 83/45 via cuff took it manually and it was 101/ 48, Otherwise VSS, pt had not voided since 1400 hrs scanned bladder it showed that there was approx 300 cc retained I encouraged pt voided  She did and voided 250 cc of yellow

## 2011-08-29 NOTE — Progress Notes (Signed)
Patient's post-transfusion hg increased to 10.2, but decreased to 8.2 this AM.  Placed order for 2 u prbc's.   BP continues to be labile. Fluid administration has been recommended by internal medicine.  Etiology of low BP and ongoing decreasing hg unclear.  For now will continue to monitor and await input from internal medicine and will reach out to Dr. Turner Daniels to see if he may have any additional recommendations.

## 2011-08-29 NOTE — Progress Notes (Signed)
Patient comfortable.  Receiving 2 u prbc's.   Per family, patient has complained of dysphagia prior to surgery and after  BP 108/64  Pulse 72  Temp 98.4 F (36.9 C) (Oral)  Resp 18  Ht 5\' 3"  (1.6 m)  Wt 58.968 kg (130 lb)  BMI 23.03 kg/m2  SpO2 93%  NVI Dressing CDI (has been changed) No fullness about left hip incision (no hematoma)  Pt s/p right THA with postop anemia, intermittent hypotension  - continue with 2 u prbs - cont PT/OT - continue anticoagulation for now, unless reason to discontinue develops - follow dysphagia for now, may need to be worked up on an outpatient basis - this appears to be a chronic condition

## 2011-08-29 NOTE — Progress Notes (Signed)
Physical Therapy Treatment Patient Details Name: NALAYSIA MANGANIELLO MRN: [id number] DOB: 04/28/1917 Today's Date: 08/29/2011 Time: 7829-5621 PT Time Calculation (min): 23 min  PT Assessment / Plan / Recommendation Comments on Treatment Session  Pt progressing well. Pt stated that she was unable to ambulate but with motivation, was able to ambulate around the bed with only minimal assist. Pt still limited due to pain, although she does well with maintaining posterior hip precautions during transfers.    Follow Up Recommendations  Skilled nursing facility       Equipment Recommendations  None recommended by PT       Frequency Min 5X/week   Plan Discharge plan remains appropriate;Frequency remains appropriate    Precautions / Restrictions Precautions Precautions: Posterior Hip Precaution Booklet Issued: No Restrictions Weight Bearing Restrictions: Yes RLE Weight Bearing: Weight bearing as tolerated       Mobility  Bed Mobility Bed Mobility: Sit to Supine;Scooting to HOB Sit to Supine: 3: Mod assist;HOB elevated Scooting to HOB: 3: Mod assist;With rail Details for Bed Mobility Assistance: VC for proper sequencing to maintain hip precautions during transfer. Assist with bil. LEs. Pt able to use LLE and UEs to bring self to head of bed with assist with drawsheet Transfers Transfers: Sit to Stand;Stand to Sit;Stand Pivot Transfers Sit to Stand: 1: +2 Total assist;With upper extremity assist;From bed Sit to Stand: Patient Percentage: 70% Stand to Sit: 3: Mod assist Details for Transfer Assistance: VC for proper technique and safety while maintaining hip precautions. Cues for hand placement for use to RW. Ambulation/Gait Ambulation/Gait Assistance: 4: Min assist Ambulation Distance (Feet): 25 Feet Assistive device: Rolling walker Ambulation/Gait Assistance Details: VC for proper technique with RW and safety. Pt reluctant to bear weight through RLE and ambulated with ball of foot only  on RLE. Gait Pattern: Step-to pattern;Decreased step length - left;Decreased stance time - right;Decreased hip/knee flexion - right;Decreased weight shift to right Gait velocity: decreased gait speed     PT Goals Acute Rehab PT Goals PT Goal: Supine/Side to Sit - Progress: Progressing toward goal PT Goal: Sit to Supine/Side - Progress: Progressing toward goal PT Goal: Sit to Stand - Progress: Progressing toward goal PT Goal: Stand to Sit - Progress: Progressing toward goal PT Goal: Ambulate - Progress: Progressing toward goal Additional Goals PT Goal: Additional Goal #1 - Progress: Progressing toward goal  Visit Information  Last PT Received On: 08/29/11 Assistance Needed: +1 (2 for safety) PT/OT Co-Evaluation/Treatment: Yes       Cognition  Overall Cognitive Status: Appears within functional limits for tasks assessed/performed Arousal/Alertness: Awake/alert Orientation Level: Oriented X4 / Intact Behavior During Session: [facility] for tasks performed Cognition - Other Comments: pt HOH.         End of Session PT - End of Session Equipment Utilized During Treatment: Gait belt Activity Tolerance: Patient tolerated treatment well Patient left: in bed;with call bell/phone within reach;with family/visitor present;with nursing in room Nurse Communication: Mobility status    Milana Kidney 08/29/2011, 2:28 PM  08/29/2011 Milana Kidney DPT PAGER: [phone] OFFICE: [phone]

## 2011-08-29 NOTE — Progress Notes (Signed)
Occupational Therapy Evaluation Patient Details Name: [name] MRN: [id number] DOB: [birth year] Today's Date: 08/29/2011 Time: 4098-1191 OT Time Calculation (min): 21 min  OT Assessment / Plan / Recommendation Clinical Impression  Pt s/p removal of left hip hardware with revision and total hip arthroplasty.  Will benefit from acute OT services in order to address below problem list in prep for d/c to SNF.    OT Assessment  Patient needs continued OT Services    Follow Up Recommendations  Skilled nursing facility    Barriers to Discharge      Equipment Recommendations  Defer to next venue    Recommendations for Other Services    Frequency  Min 2X/week    Precautions / Restrictions Precautions Precautions: Posterior Hip Precaution Booklet Issued: No Restrictions Weight Bearing Restrictions: Yes RLE Weight Bearing: Weight bearing as tolerated   Pertinent Vitals/Pain See vitals    ADL  Upper Body Dressing: Performed;Minimal assistance Where Assessed - Upper Body Dressing: Unsupported sitting Lower Body Dressing: Simulated;Maximal assistance Where Assessed - Lower Body Dressing: Sopported sit to stand Toilet Transfer: Simulated;+2 Total assistance Toilet Transfer: Patient Percentage: 70% Toilet Transfer Method:  (ambulating) Toileting - Clothing Manipulation and Hygiene: Performed;Minimal assistance Where Assessed - Engineer, mining and Hygiene: Standing Equipment Used: Gait belt;Rolling walker Transfers/Ambulation Related to ADLs: Min assist with RW pt unable to tolerate much weight through RLE. ADL Comments: Pt with soiled linens upon OT arrival.  Pt able to perform back peri care with min assist for thoroughness.       OT Diagnosis: Generalized weakness;Acute pain  OT Problem List: Decreased activity tolerance;Decreased knowledge of use of DME or AE;Decreased knowledge of precautions;Pain OT Treatment Interventions: Self-care/ADL training;DME and/or  AE instruction;Therapeutic activities;Patient/family education   OT Goals Acute Rehab OT Goals OT Goal Formulation: With patient Time For Goal Achievement: 09/12/11 Potential to Achieve Goals: Good ADL Goals Pt Will Perform Grooming: with supervision;Standing at sink ADL Goal: Grooming - Progress: Goal set today Pt Will Perform Lower Body Bathing: with min assist;Sit to stand from chair;Sit to stand from bed;with adaptive equipment ADL Goal: Lower Body Bathing - Progress: Goal set today Pt Will Perform Lower Body Dressing: with min assist;Sit to stand from chair;Sit to stand from bed;with adaptive equipment ADL Goal: Lower Body Dressing - Progress: Goal set today Pt Will Transfer to Toilet: with min assist;with DME;Ambulation;Comfort height toilet;Maintaining hip precautions ADL Goal: Toilet Transfer - Progress: Goal set today Pt Will Perform Toileting - Clothing Manipulation: with supervision ADL Goal: Toileting - Clothing Manipulation - Progress: Goal set today Pt Will Perform Toileting - Hygiene: with supervision;Standing at 3-in-1/toilet ADL Goal: Toileting - Hygiene - Progress: Goal set today Miscellaneous OT Goals Miscellaneous OT Goal #1: Pt will independently verbalize and generalize 3/3 posterior hip precautions during all ADL activities. OT Goal: Miscellaneous Goal #1 - Progress: Goal set today  Visit Information  Last OT Received On: 08/29/11 Assistance Needed: +1 (2 for safety)    Subjective Data      Prior Functioning  Home Living Lives With: Alone Available Help at Discharge: Family;Available PRN/intermittently Type of Home: Mobile home Home Access: Stairs to enter Entrance Stairs-Number of Steps: 3 Entrance Stairs-Rails: Right;Left;Can reach both Home Layout: One level Bathroom Shower/Tub: Health visitor: Standard Bathroom Accessibility: Yes How Accessible: Accessible via walker Home Adaptive Equipment: Walker - four wheeled;Straight cane  (toilet rises without rails) Prior Function Level of Independence: Independent with assistive device(s);Needs assistance Needs Assistance: Light Housekeeping;Meal Prep Driving: No Vocation:  Retired Musician: HOH    Cognition  Overall Cognitive Status: Appears within functional limits for tasks assessed/performed Arousal/Alertness: Awake/alert Orientation Level: Oriented X4 / Intact Behavior During Session: WFL for tasks performed Cognition - Other Comments: pt HOH.      Extremity/Trunk Assessment Right Upper Extremity Assessment RUE ROM/Strength/Tone: Within functional levels Left Upper Extremity Assessment LUE ROM/Strength/Tone: Within functional levels   Mobility Bed Mobility Bed Mobility: Sit to Supine;Scooting to HOB Sit to Supine: 3: Mod assist;HOB elevated Scooting to HOB: 3: Mod assist;With rail Details for Bed Mobility Assistance: VC for proper sequencing to maintain hip precautions during transfer. Assist with bil. LEs. Pt able to use LLE and UEs to bring self to head of bed with assist with drawsheet Transfers Sit to Stand: 1: +2 Total assist;With upper extremity assist;From bed Sit to Stand: Patient Percentage: 70% Stand to Sit: 3: Mod assist Details for Transfer Assistance: VC for proper technique and safety while maintaining hip precautions. Cues for hand placement for use to RW.   Exercise    Balance    End of Session OT - End of Session Equipment Utilized During Treatment: Gait belt Activity Tolerance: Patient tolerated treatment well Patient left: in bed;with call bell/phone within reach;with bed alarm set;with nursing in room;with family/visitor present Nurse Communication: Mobility status  08/29/2011 Cipriano Mile OTR/L Pager [phone] Office [phone]  Cipriano Mile 08/29/2011, 2:59 PM

## 2011-08-30 ENCOUNTER — Encounter (HOSPITAL_COMMUNITY): Payer: Self-pay | Admitting: Orthopedic Surgery

## 2011-08-30 DIAGNOSIS — I959 Hypotension, unspecified: Secondary | ICD-10-CM

## 2011-08-30 DIAGNOSIS — D649 Anemia, unspecified: Secondary | ICD-10-CM | POA: Diagnosis not present

## 2011-08-30 DIAGNOSIS — Z96649 Presence of unspecified artificial hip joint: Secondary | ICD-10-CM

## 2011-08-30 DIAGNOSIS — N390 Urinary tract infection, site not specified: Secondary | ICD-10-CM

## 2011-08-30 LAB — TYPE AND SCREEN
Antibody Screen: NEGATIVE
Unit division: 0
Unit division: 0
Unit division: 0

## 2011-08-30 LAB — BASIC METABOLIC PANEL
BUN: 19 mg/dL (ref 6–23)
Calcium: 8.1 mg/dL — ABNORMAL LOW (ref 8.4–10.5)
Creatinine, Ser: 1.57 mg/dL — ABNORMAL HIGH (ref 0.50–1.10)
GFR calc Af Amer: 32 mL/min — ABNORMAL LOW (ref >90)
GFR calc non Af Amer: 27 mL/min — ABNORMAL LOW (ref >90)
Glucose, Bld: 94 mg/dL (ref 70–99)

## 2011-08-30 LAB — CBC
HCT: 34.5 % — ABNORMAL LOW (ref 36.0–46.0)
Hemoglobin: 11.6 g/dL — ABNORMAL LOW (ref 12.0–15.0)
MCH: 30.7 pg (ref 26.0–34.0)
MCHC: 33.6 g/dL (ref 30.0–36.0)
RDW: 17.6 % — ABNORMAL HIGH (ref 11.5–15.5)

## 2011-08-30 NOTE — Progress Notes (Signed)
Occupational Therapy Treatment Patient Details Name: [name] MRN: [id number] DOB: Jul 17, 1917 Today's Date: 08/30/2011 Time: 6213-0865 OT Time Calculation (min): 26 min  OT Assessment / Plan / Recommendation Comments on Treatment Session Pt. making good progress today with mobility and activity tolerance improving.    Follow Up Recommendations  Skilled nursing facility       Equipment Recommendations  Defer to next venue       Frequency Min 2X/week   Plan Discharge plan remains appropriate    Precautions / Restrictions Precautions Precautions: Posterior Hip Precaution Booklet Issued: No Restrictions Weight Bearing Restrictions: Yes RLE Weight Bearing: Weight bearing as tolerated       ADL  Grooming: Performed;Wash/dry face;Wash/dry hands;Set up;Minimal assistance Where Assessed - Grooming: Supported standing Lower Body Dressing: Performed;+1 Total assistance (donning bil socks) Where Assessed - Lower Body Dressing: Sopported sit to stand Toilet Transfer: Performed;+2 Total assistance Toilet Transfer: Patient Percentage: 70% Toilet Transfer Method: Stand pivot Acupuncturist: Bedside commode Equipment Used: Gait belt;Rolling walker Transfers/Ambulation Related to ADLs: Mod assist with use of RW and cues for hand placement ADL Comments: Pt. incontinent of urine upon arrival and assist provided for hygiene.      OT Goals Acute Rehab OT Goals OT Goal Formulation: With patient Time For Goal Achievement: 09/12/11 Potential to Achieve Goals: Good ADL Goals Pt Will Perform Grooming: with supervision;Standing at sink ADL Goal: Grooming - Progress: Progressing toward goals Pt Will Perform Lower Body Dressing: with min assist;Sit to stand from chair;Sit to stand from bed;with adaptive equipment ADL Goal: Lower Body Dressing - Progress: Progressing toward goals Pt Will Transfer to Toilet: with min assist;with DME;Ambulation;Comfort height toilet;Maintaining  hip precautions ADL Goal: Toilet Transfer - Progress: Progressing toward goals Pt Will Perform Toileting - Hygiene: with supervision;Standing at 3-in-1/toilet ADL Goal: Toileting - Hygiene - Progress: Progressing toward goals Miscellaneous OT Goals Miscellaneous OT Goal #1: Pt will independently verbalize and generalize 3/3 posterior hip precautions during all ADL activities. OT Goal: Miscellaneous Goal #1 - Progress: Progressing toward goals  Visit Information  Last OT Received On: 08/30/11 Assistance Needed: +1 (2 for safety)          Cognition  Overall Cognitive Status: Appears within functional limits for tasks assessed/performed Arousal/Alertness: Awake/alert Orientation Level: Oriented X4 / Intact Behavior During Session: [facility] for tasks performed Cognition - Other Comments: pt HOH.      Mobility Bed Mobility Bed Mobility: Supine to Sit;Sitting - Scoot to Edge of Bed Supine to Sit: 3: Mod assist Supine to Sit: Patient Percentage: 40% Sitting - Scoot to Edge of Bed: 2: Max assist Details for Bed Mobility Assistance: cues for safe technique, sequencing, hip precautions.   Transfers Transfers: Sit to Stand;Stand to Sit Sit to Stand: 1: +2 Total assist;With upper extremity assist;From bed Sit to Stand: Patient Percentage: 70% Stand to Sit: With upper extremity assist;With armrests;To chair/3-in-1;3: Mod assist Details for Transfer Assistance: cues for use of UEs, movement of LEs during pivot, hip precautions.           End of Session OT - End of Session Equipment Utilized During Treatment: Gait belt Activity Tolerance: Patient tolerated treatment well Patient left: with call bell/phone within reach;with bed alarm set;with nursing in room;with family/visitor present;in chair;Other (comment) (in w/c with transport) Nurse Communication: Mobility status   Lavoy Bernards, OTR/L Pager [phone] 08/30/2011, 2:37 PM

## 2011-08-30 NOTE — Clinical Social Work Placement (Addendum)
Clinical Social Work Department CLINICAL SOCIAL WORK PLACEMENT NOTE 08/30/2011  Patient:  [name], [name]  Account Number:  [id number] Admit date:  08/27/2011  Clinical Social Worker:  Ashley Jacobs, LCSW  Date/time:  08/30/2011 02:45 PM  Clinical Social Work is seeking post-discharge placement for this patient at the following level of care:   SKILLED NURSING   (*CSW will update this form in Epic as items are completed)   08/30/2011  Patient/family provided with [facility] Department of Clinical Social Work's list of facilities offering this level of care within the geographic area requested by the patient (or if unable, by the patient's family).  08/30/2011  Patient/family informed of their freedom to choose among providers that offer the needed level of care, that participate in Medicare, Medicaid or managed care program needed by the patient, have an available bed and are willing to accept the patient.  08/30/2011  Patient/family informed of [facility]' ownership interest in [facility], as well as of the fact that they are under no obligation to receive care at this facility.  PASARR submitted to EDS on 08/30/2011 PASARR number received from EDS on 08/30/2011  FL2 transmitted to all facilities in geographic area requested by pt/family on  08/30/2011 FL2 transmitted to all facilities within larger geographic area on   Patient informed that his/her managed care company has contracts with or will negotiate with  certain facilities, including the following:     Patient/family informed of bed offers received:  08/31/11 Patient chooses bed at [facility] Physician recommends and patient chooses bed at    Patient to be transferred to [facility]  on  08/31/11 Patient to be transferred to facility by [facility]  The following physician request were entered in Epic:   Additional Comments: DC is for SNF.  MD please sign FL2 located in chart.  Ashley Jacobs, MSW LCSW [phone]  (coverage)

## 2011-08-30 NOTE — Consult Note (Signed)
[facility] [place] TEAM 1 - Stepdown/ICU TEAM  CONSULT F/U NOTE  PCP:  Herb Grays, MD  Subjective: 78 y.o. female who underwent removal of L hip hardware with revision and THA on 08/27/2011. She began to have hypotension in the late evening and early AM of 6/14-15/2013.  She denied any chest pain or SOB, but she did report having weakness and dizziness. Her blood pressure was found to be 85/30 and previously had been 137/84. She was given a 1 liter of NSS bolus without improvement in her blood pressure. Cardiac Enzymes were ordered and an EKG. Her EKG was without changes from the previous EKG on 08/18/2011.  The patient is quite alert today.  She feels much better.  She denies f/c, sob, n/v, or abdom pain.    Objective:  Intake/Output Summary (Last 24 hours) at 08/30/11 1454 Last data filed at 08/30/11 0800  Gross per 24 hour  Intake 1152.5 ml  Output    750 ml  Net  402.5 ml   Blood pressure 117/53, pulse 70, temperature 98.6 F (37 C), temperature source Oral, resp. rate 16, height 5\' 3"  (1.6 m), weight 58.968 kg (130 lb), SpO2 94.00%.  Physical Exam: General: No acute respiratory distress - alert and conversant Lungs: Clear to auscultation bilaterally without wheezes or crackles Cardiovascular: Regular rate and rhythm without murmur gallop or rub normal S1 and S2 Abdomen: Nontender, nondistended, soft, bowel sounds positive, no rebound, no ascites, no appreciable mass Extremities: No significant cyanosis, clubbing, or edema bilateral lower extremities  Lab Results:  Basename 08/30/11 0350 08/29/11 0400 08/28/11 0438  NA 138 137 139  K 3.5 4.0 4.8  CL 105 104 104  CO2 26 23 26   GLUCOSE 94 92 170*  BUN 19 22 19   CREATININE 1.57* 1.88* 1.48*  CALCIUM 8.1* 7.6* 8.0*  MG -- -- --  PHOS -- -- --    Basename 08/30/11 0350 08/29/11 0400 08/28/11 1252 08/28/11 0438 08/28/11 0111  WBC 7.6 9.7 -- 14.7* --  NEUTROABS -- -- -- -- 14.1*  HGB 11.6* 8.2* 10.2* -- --    HCT 34.5* 24.6* 31.0* -- --  MCV 91.3 96.5 -- 101.8* --  PLT 86* 97* -- 128* --    Basename 08/28/11 0111  CKTOTAL 349*  CKMB 6.9*  CKMBINDEX --  TROPONINI <0.30   Studies/Results: All recent x-ray/radiology reports have been reviewed in detail.   Medications: I have reviewed the patient's complete medication list.  Assessment/Plan:  Hypotension w/ Hx of HTN Has improved with administration of colloid and crystalloid - continue to follow trend  Postop anemia Baseline hemoglobin was 12.7 preop - hemoglobin declined into the 8 range, the patient was transfused, and then the patient declined again with concomitant volume expansion - it's not clear if the patient is continuing to bleed or this is simply reflective of dilution - we'll follow CBCs - there is no outward evidence of significant blood loss on physical exam - Hgb appears to have stabilized at this time   RIGHT HIP AVASCULAR NECROSIS W/ RETAINED HARDWARE Per primary team  CAD S/p stenting of LAD, circ, and RCA  - chronic plavix tx and aspirin - keep hemoglobin greater than or equal to 8  PAfib Currently in normal sinus rhythm  S/p pacer  Hx of TIA  COPD Well compensated at present  Acute exacerbation of Chronic renal failure Baseline crt appears to be ~1.24-1.6 - creatinine has increased notably during episode of hypotension and volume depletion -  crt appears to be back to baseline at this time  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 / chronic blood loss anemia Will stimulate bowels and guaiac stool to assure the patient is not suffering with an occult GI bleed  Thrombocytopenia Pt is not on heparin products - will need to follow trend   periph vasc disease carotid artery stenosis status post PCI on the right - abdominal abdominal aortic aneurysm - renal artery stenosis, 70% on the left  Dysphasia I have confirmed this is a chronic problem for this patient - I suspect her current symptoms may be due to an acute  exacerbation of a chronic problem possibly related to intubation for her surgical procedure - we will follow - a recent GI evaluation proved that she had esophageal dysmotility and there is no effective safe treatment for this elderly patient - see [facility] notes  Dispo [facility] will continue to follow along with you in a consultative role  Lonia Blood, MD [facility] Office  [phone] Pager [phone]  On-Call/Text Page:      [email or web address]      password [facility]

## 2011-08-30 NOTE — Clinical Social Work Psychosocial (Signed)
Clinical Social Work Department BRIEF PSYCHOSOCIAL ASSESSMENT 08/30/2011  Patient:  [name], [name]     Account Number:  [id number]     Admit date:  08/27/2011  Clinical Social Worker:  Andres Shad  Date/Time:  08/30/2011 02:39 PM  Referred by:  Physician  Date Referred:  08/30/2011 Referred for  SNF Placement   Other Referral:   CM FOR HOME NEEDS VS SNF   Interview type:  Family Other interview type:   Patient was sleeping on and off, awoke at the end of assessment and participated    PSYCHOSOCIAL DATA Living Status:  ALONE Admitted from facility:   Level of care:   Primary support name:  Daughter Primary support relationship to patient:  FAMILY Degree of support available:   Very supportive, but patient is fiercly independent thus was living alone.  Family also has another member who recently just had a stroke, thus resources are limited at this time.    CURRENT CONCERNS Current Concerns  Post-Acute Placement   Other Concerns:   none    SOCIAL WORK ASSESSMENT / PLAN Met with daughter at the bedside.  Aware of consult and role of CSW. Discussed options for patient at dc and also different facilities for short term rehab.    Daughter and family all agreeable for SNF as patient's daughter just recently had a Stroke and had to placed in a nursing home for rehab care. Family reports patient is very much alert, oriented and funny with regards to day to day living.    Family and patient agreeable to fax out in [facility] for SNF. Bed list given to daughter with regards to alternative beds.   Assessment/plan status:  Psychosocial Support/Ongoing Assessment of Needs Other assessment/ plan:   none   Information/referral to community resources:   Agreeable to fax out in community for SNF bed.  Patient and family give [facility] list    PATIENT'S/FAMILY'S RESPONSE TO PLAN OF CARE: All agreeable and appreciative of visit.  Family preference is [facility]  as patient daughter was just placed there.  Open to [facility] as well, as patient has completed SNF there in the past.    [name], MSW LCSW [phone]  (coverage for Lovette Cliche)

## 2011-08-30 NOTE — Progress Notes (Signed)
Pt transferred to 5017, per MD order. Report called to receiving nurse and all questions answered.

## 2011-08-30 NOTE — Progress Notes (Signed)
Patient ID: [name], female   DOB: [birth year], 78 y.o.   MRN: [id number] PATIENT ID: [name]  MRN: [id number]  DOB/AGE:  Oct 16, 1917 / 78 y.o.  3 Days Post-Op Procedure(s) (LRB): TOTAL HIP ARTHROPLASTY (Right)    PROGRESS NOTE Subjective: Patient is alert, oriented,no Nausea, no Vomiting, yes passing gas, no Bowel Movement. Taking PO mod. Denies SOB, Chest or Calf Pain. Using Incentive Spirometer, PAS in place. Ambulate WBAT in room with PT Patient reports pain as 7 on 0-10 scale  .    Objective: Vital signs in last 24 hours: Filed Vitals:   08/29/11 1728 08/29/11 2000 08/29/11 2335 08/30/11 0428  BP: 105/50 102/60 90/55 100/50  Pulse: 73 72 71 71  Temp: 97.6 F (36.4 C) 98.3 F (36.8 C) 98.3 F (36.8 C) 98.4 F (36.9 C)  TempSrc: Oral Oral Oral Oral  Resp: 20  15 15   Height:      Weight:      SpO2:  96% 95% 94%      Intake/Output from previous day: I/O last 3 completed shifts: In: 3735 [P.O.:360; I.V.:2750; Blood:625] Out: 1550 [Urine:1550]   Intake/Output this shift:     LABORATORY DATA:  Basename 08/30/11 0350 08/29/11 0400  WBC 7.6 9.7  HGB 11.6* 8.2*  HCT 34.5* 24.6*  PLT 86* 97*  NA 138 137  K 3.5 4.0  CL 105 104  CO2 26 23  BUN 19 22  CREATININE 1.57* 1.88*  GLUCOSE 94 92  GLUCAP -- --  INR 1.01 --  CALCIUM 8.1* --    Examination: Neurologically intact ABD soft Neurovascular intact Sensation intact distally Intact pulses distally Dorsiflexion/Plantar flexion intact Incision: moderate drainage No cellulitis present Compartment soft} XR AP&Lat of hip shows well placed\fixed THA, 1-2+ eccymosis to R thigh,   Assessment:   3 Days Post-Op Procedure(s) (LRB): TOTAL HIP ARTHROPLASTY (Right) ADDITIONAL DIAGNOSIS:  Acute Blood Loss Anemia and chronic mild dysphagia  Plan: PT/OT WBAT, THA  posterior precautions Change dressing, transfer to 5000 today DVT Prophylaxis: SCDx72 hrs, ASA 325 mg BID x 2 weeks  DISCHARGE PLAN: Skilled  Nursing Facility/Rehab  DISCHARGE NEEDS: HHPT, HHRN, CPM, Walker and 3-in-1 comode seat

## 2011-08-30 NOTE — Progress Notes (Signed)
Utilization review completed.  

## 2011-08-30 NOTE — Progress Notes (Signed)
Physical Therapy Treatment Patient Details Name: [name] MRN: [id number] DOB: 1917-07-02 Today's Date: 08/30/2011 Time: 4098-1191 PT Time Calculation (min): 26 min  PT Assessment / Plan / Recommendation Comments on Treatment Session  pt presents s/p R THRevision.  pt making great progress and continues to be very motivated.  Needs A to maintain hip precautions during bed mobility and cueing during SPT to 3-in-1.      Follow Up Recommendations  Skilled nursing facility    Barriers to Discharge        Equipment Recommendations  Defer to next venue    Recommendations for Other Services    Frequency Min 5X/week   Plan Discharge plan remains appropriate;Frequency remains appropriate    Precautions / Restrictions Precautions Precautions: Posterior Hip Precaution Booklet Issued: No Restrictions Weight Bearing Restrictions: Yes RLE Weight Bearing: Weight bearing as tolerated   Pertinent Vitals/Pain Does not rate, but states "Its really not bad."    Mobility  Bed Mobility Bed Mobility: Supine to Sit;Sitting - Scoot to Edge of Bed Supine to Sit: 3: Mod assist Sitting - Scoot to Edge of Bed: 2: Max assist Details for Bed Mobility Assistance: cues for safe technique, sequencing, hip precautions.   Transfers Transfers: Sit to Stand;Stand to Sit;Stand Pivot Transfers Sit to Stand: 1: +2 Total assist;With upper extremity assist;From bed Sit to Stand: Patient Percentage: 70% Stand to Sit: With upper extremity assist;With armrests;To chair/3-in-1;3: Mod assist (to W/C) Stand Pivot Transfers: 1: +2 Total assist Stand Pivot Transfers: Patient Percentage: 70% Details for Transfer Assistance: cues for use of UEs, movement of LEs during pivot, hip precautions.   Ambulation/Gait Ambulation/Gait Assistance: 4: Min assist Ambulation Distance (Feet): 60 Feet Assistive device: Rolling walker Ambulation/Gait Assistance Details: cues for upright posture, sequencing gait, use of RW, increase  heel strike R LE. Increase WBing R LE.   Gait Pattern: Step-to pattern;Decreased step length - left;Decreased stance time - right;Decreased hip/knee flexion - right;Decreased weight shift to right Stairs: No Wheelchair Mobility Wheelchair Mobility: No    Exercises     PT Diagnosis:    PT Problem List:   PT Treatment Interventions:     PT Goals Acute Rehab PT Goals Time For Goal Achievement: 09/11/11 PT Goal: Supine/Side to Sit - Progress: Progressing toward goal PT Goal: Sit to Stand - Progress: Progressing toward goal PT Goal: Stand to Sit - Progress: Progressing toward goal Pt will Ambulate: >150 feet;with supervision;with rolling walker PT Goal: Ambulate - Progress: Updated due to goal met Additional Goals PT Goal: Additional Goal #1 - Progress: Progressing toward goal  Visit Information  Last PT Received On: 08/30/11 Assistance Needed: +1 (+2 for chair follow and safety with lines) PT/OT Co-Evaluation/Treatment: Yes    Subjective Data  Subjective: If anyone else tells me I look good I'm gonna run down the hall screaming!   Cognition  Overall Cognitive Status: Appears within functional limits for tasks assessed/performed Arousal/Alertness: Awake/alert Orientation Level: Oriented X4 / Intact Behavior During Session: [facility] for tasks performed Cognition - Other Comments: pt HOH.      Balance  Balance Balance Assessed: No  End of Session PT - End of Session Equipment Utilized During Treatment: Gait belt Activity Tolerance: Patient tolerated treatment well Patient left:  (In W/C with Nsg to transfer to 5000.  ) Nurse Communication: Mobility status    Cosmo Tetreault, Alison Murray, [place] [phone] 08/30/2011, 11:01 AM

## 2011-08-31 DIAGNOSIS — M25579 Pain in unspecified ankle and joints of unspecified foot: Secondary | ICD-10-CM | POA: Diagnosis not present

## 2011-08-31 DIAGNOSIS — N390 Urinary tract infection, site not specified: Secondary | ICD-10-CM

## 2011-08-31 DIAGNOSIS — K5900 Constipation, unspecified: Secondary | ICD-10-CM | POA: Diagnosis not present

## 2011-08-31 DIAGNOSIS — F411 Generalized anxiety disorder: Secondary | ICD-10-CM | POA: Diagnosis not present

## 2011-08-31 DIAGNOSIS — I2510 Atherosclerotic heart disease of native coronary artery without angina pectoris: Secondary | ICD-10-CM | POA: Diagnosis not present

## 2011-08-31 DIAGNOSIS — N189 Chronic kidney disease, unspecified: Secondary | ICD-10-CM | POA: Diagnosis not present

## 2011-08-31 DIAGNOSIS — I959 Hypotension, unspecified: Secondary | ICD-10-CM

## 2011-08-31 DIAGNOSIS — M1610 Unilateral primary osteoarthritis, unspecified hip: Secondary | ICD-10-CM | POA: Diagnosis not present

## 2011-08-31 DIAGNOSIS — M25559 Pain in unspecified hip: Secondary | ICD-10-CM | POA: Diagnosis not present

## 2011-08-31 DIAGNOSIS — J449 Chronic obstructive pulmonary disease, unspecified: Secondary | ICD-10-CM | POA: Diagnosis not present

## 2011-08-31 DIAGNOSIS — Z950 Presence of cardiac pacemaker: Secondary | ICD-10-CM | POA: Diagnosis not present

## 2011-08-31 DIAGNOSIS — R609 Edema, unspecified: Secondary | ICD-10-CM | POA: Diagnosis not present

## 2011-08-31 DIAGNOSIS — D696 Thrombocytopenia, unspecified: Secondary | ICD-10-CM | POA: Diagnosis not present

## 2011-08-31 DIAGNOSIS — Z5189 Encounter for other specified aftercare: Secondary | ICD-10-CM | POA: Diagnosis not present

## 2011-08-31 DIAGNOSIS — E039 Hypothyroidism, unspecified: Secondary | ICD-10-CM | POA: Diagnosis not present

## 2011-08-31 DIAGNOSIS — Z96649 Presence of unspecified artificial hip joint: Secondary | ICD-10-CM

## 2011-08-31 DIAGNOSIS — I4891 Unspecified atrial fibrillation: Secondary | ICD-10-CM | POA: Diagnosis not present

## 2011-08-31 DIAGNOSIS — D649 Anemia, unspecified: Secondary | ICD-10-CM | POA: Diagnosis not present

## 2011-08-31 DIAGNOSIS — M79609 Pain in unspecified limb: Secondary | ICD-10-CM | POA: Diagnosis not present

## 2011-08-31 DIAGNOSIS — M7989 Other specified soft tissue disorders: Secondary | ICD-10-CM | POA: Diagnosis not present

## 2011-08-31 DIAGNOSIS — S79919A Unspecified injury of unspecified hip, initial encounter: Secondary | ICD-10-CM | POA: Diagnosis not present

## 2011-08-31 DIAGNOSIS — M1990 Unspecified osteoarthritis, unspecified site: Secondary | ICD-10-CM | POA: Diagnosis not present

## 2011-08-31 LAB — CBC
HCT: 33.8 % — ABNORMAL LOW (ref 36.0–46.0)
Hemoglobin: 11.2 g/dL — ABNORMAL LOW (ref 12.0–15.0)
MCH: 30.9 pg (ref 26.0–34.0)
MCHC: 33.1 g/dL (ref 30.0–36.0)
MCV: 93.1 fL (ref 78.0–100.0)

## 2011-08-31 MED ORDER — HYDROCODONE-ACETAMINOPHEN 5-325 MG PO TABS: 0.5000 | ORAL_TABLET | ORAL | Status: AC | PRN

## 2011-08-31 MED ORDER — METHOCARBAMOL 500 MG PO TABS: 500.0000 mg | ORAL_TABLET | Freq: Four times a day (QID) | ORAL | Status: AC | PRN

## 2011-08-31 NOTE — Progress Notes (Signed)
Patient ID: [name], female   DOB: 03/12/1918, 78 y.o.   MRN: [id number] PATIENT ID: [name]  MRN: [id number]  DOB/AGE:  Oct 16, 1917 / 78 y.o.  4 Days Post-Op Procedure(s) (LRB): TOTAL HIP ARTHROPLASTY (Right)    PROGRESS NOTE Subjective: Patient is alert, oriented,no Nausea, no Vomiting, yes passing gas, no Bowel Movement. Taking PO mod. Denies SOB, Chest or Calf Pain. Using Incentive Spirometer, PAS in place. Ambulate WBAT with walker and PT Patient reports pain as 5 on 0-10 scale  .    Objective: Vital signs in last 24 hours: Filed Vitals:   08/30/11 0800 08/30/11 1419 08/30/11 2242 08/31/11 0548  BP: 119/52 117/53 131/50 103/58  Pulse: 73 70 79 71  Temp: 97.8 F (36.6 C) 98.6 F (37 C) 98.2 F (36.8 C) 98.3 F (36.8 C)  TempSrc: Oral Oral    Resp: 15 16 18 16   Height:      Weight:      SpO2: 94% 94% 94% 91%      Intake/Output from previous day: I/O last 3 completed shifts: In: 1360 [P.O.:560; I.V.:800] Out: 1350 [Urine:1350]   Intake/Output this shift:     LABORATORY DATA:  Basename 08/31/11 0500 08/30/11 0350 08/29/11 0400  WBC 7.3 7.6 --  HGB 11.2* 11.6* --  HCT 33.8* 34.5* --  PLT 88* 86* --  NA -- 138 137  K -- 3.5 4.0  CL -- 105 104  CO2 -- 26 23  BUN -- 19 22  CREATININE -- 1.57* 1.88*  GLUCOSE -- 94 92  GLUCAP -- -- --  INR -- 1.01 --  CALCIUM -- 8.1* --    Examination: Neurologically intact ABD soft Neurovascular intact Sensation intact distally Intact pulses distally Dorsiflexion/Plantar flexion intact Incision: moderate drainage No cellulitis present Compartment soft} XR AP&Lat of hip shows well placed\fixed THA Dressing change this morning, scant serous drainage, she has generalized bruising and ecchymosis. Because of her history of multiple stents she is on Plavix and we've maintained her on aspirin, but after discussing situation with the patient and her daughter we will probably discontinue the aspirin secondary to the  serous drainage and generalized ecchymosis Assessment:   4 Days Post-Op Procedure(s) (LRB): TOTAL HIP ARTHROPLASTY (Right) ADDITIONAL DIAGNOSIS:  fragile vessels easy bruising  Plan: PT/OT WBAT, THA  posterior precautions  DVT Prophylaxis: Continue Plavix for now  DISCHARGE PLAN: Skilled Nursing Facility/Rehab, patient and family. Specifically have requested [facility] which I think is reasonable, for proximity to her family and in light of the patient's advanced age and overall fragile condition.  DISCHARGE NEEDS: HHPT, HHRN, CPM, Walker and 3-in-1 comode seat

## 2011-08-31 NOTE — Progress Notes (Signed)
Clinical Social Work  CSW faxed dc summary and medications to SNF. CSW verified that SNF did not need hard scripts for SNF. SNF completed paperwork with patient and is agreeable to admission today. CSW prepared dc packet. CSW informed patient, dtr and RN of dc and all parties were agreeable. CSW prepared dc packet. CSW coordinated transportation via [place]. CSW is signing off.  [place], [place] [phone] (Coverage for Lovette Cliche)

## 2011-08-31 NOTE — Progress Notes (Signed)
PT Cancel Note:   Ambulance has been called to transport pt to next venue.    Verdell Face, [place] [phone] 08/31/2011

## 2011-08-31 NOTE — Progress Notes (Signed)
Clinical Social Work  CSW met with patient and dtr Britta Mccreedy) at bedside. Patient and family are interested in [facility] due to patient's dtr being at that facility as well. CSW spoke with SNF who is agreeable to patient admitting at dc. SNF will meet with patient and family today to complete admission paperwork for when patient is ready to dc. CSW will continue to follow.  [place], [place] [phone] (Coverage for Lovette Cliche)

## 2011-08-31 NOTE — Progress Notes (Signed)
Subjective: No specific complaints. Denies any pain.  Objective: Vital signs in last 24 hours: Filed Vitals:   08/30/11 0800 08/30/11 1419 08/30/11 2242 08/31/11 0548  BP: 119/52 117/53 131/50 103/58  Pulse: 73 70 79 71  Temp: 97.8 F (36.6 C) 98.6 F (37 C) 98.2 F (36.8 C) 98.3 F (36.8 C)  TempSrc: Oral Oral    Resp: 15 16 18 16   Height:      Weight:      SpO2: 94% 94% 94% 91%   Weight change:   Intake/Output Summary (Last 24 hours) at 08/31/11 1103 Last data filed at 08/31/11 0900  Gross per 24 hour  Intake    760 ml  Output    600 ml  Net    160 ml    Physical Exam: General: Awake, Oriented, No acute distress. HEENT: EOMI. Neck: Supple CV: S1 and S2 Lungs: Clear to ascultation bilaterally Abdomen: Soft, Nontender, Nondistended, +bowel sounds. Ext: Good pulses. Trace edema.  Bruising in UE bilaterally.   Lab Results: Basic Metabolic Panel:  Lab 08/30/11 1610 08/29/11 0400 08/28/11 0438  NA 138 137 139  K 3.5 4.0 4.8  CL 105 104 104  CO2 26 23 26   GLUCOSE 94 92 170*  BUN 19 22 19   CREATININE 1.57* 1.88* 1.48*  CALCIUM 8.1* 7.6* 8.0*  MG -- -- --  PHOS -- -- --   Liver Function Tests: No results found for this basename: AST:5,ALT:5,ALKPHOS:5,BILITOT:5,PROT:5,ALBUMIN:5 in the last 168 hours No results found for this basename: LIPASE:5,AMYLASE:5 in the last 168 hours No results found for this basename: AMMONIA:5 in the last 168 hours CBC:  Lab 08/31/11 0500 08/30/11 0350 08/29/11 0400 08/28/11 1252 08/28/11 0438 08/28/11 0111  WBC 7.3 7.6 9.7 -- 14.7* 16.9*  NEUTROABS -- -- -- -- -- 14.1*  HGB 11.2* 11.6* 8.2* 10.2* 8.6* --  HCT 33.8* 34.5* 24.6* 31.0* 27.9* --  MCV 93.1 91.3 96.5 -- 101.8* 101.1*  PLT 88* 86* 97* -- 128* 126*   Cardiac Enzymes:  Lab 08/28/11 0111  CKTOTAL 349*  CKMB 6.9*  CKMBINDEX --  TROPONINI <0.30   BNP (last 3 results) No results found for this basename: PROBNP:3 in the last 8760 hours CBG: No results found for  this basename: GLUCAP:5 in the last 168 hours No results found for this basename: HGBA1C:5 in the last 72 hours Other Labs: No components found with this basename: POCBNP:3 No results found for this basename: DDIMER:2 in the last 168 hours No results found for this basename: CHOL:2,HDL:2,LDLCALC:2,TRIG:2,CHOLHDL:2,LDLDIRECT:2 in the last 168 hours No results found for this basename: TSH,T4TOTAL,FREET3,T3FREE,FREET4,THYROIDAB in the last 168 hours No results found for this basename: VITAMINB12:2,FOLATE:2,FERRITIN:2,TIBC:2,IRON:2,RETICCTPCT:2 in the last 168 hours  Micro Results: Recent Results (from the past 240 hour(s))  URINE CULTURE     Status: Normal   Collection Time   08/28/11  5:26 AM      Component Value Range Status Comment   Specimen Description URINE, CATHETERIZED   Final    Special Requests NONE   Final    Culture  Setup Time [id number]   Final    Colony Count NO GROWTH   Final    Culture NO GROWTH   Final    Report Status 08/29/2011 FINAL   Final     Studies/Results: No results found.  Medications: I have reviewed the patient's current medications. Scheduled Meds:   . amiodarone  200 mg Oral Daily  . calcium-vitamin D  1 tablet Oral Daily  . cefTRIAXone (  ROCEPHIN)  IV  1 g Intravenous QHS  . clopidogrel  75 mg Oral Daily  . vitamin B-12  500 mcg Oral Daily  . cycloSPORINE  1 drop Both Eyes BID  . ferrous sulfate  325 mg Oral Q breakfast  . levothyroxine  125 mcg Oral QAC breakfast  . metoprolol succinate  25 mg Oral Q breakfast  . pantoprazole  40 mg Oral Q1200  . polyethylene glycol  17 g Oral Daily  . simvastatin  20 mg Oral q1800  . DISCONTD: aspirin EC  81 mg Oral BID   Continuous Infusions:   . sodium chloride 50 mL/hr at 08/29/11 1800   PRN Meds:.acetaminophen, acetaminophen, alum & mag hydroxide-simeth, bisacodyl, diphenhydrAMINE, fentaNYL, HYDROcodone-acetaminophen, magnesium hydroxide, menthol-cetylpyridinium, methocarbamol (ROBAXIN) IV,  methocarbamol, ondansetron (ZOFRAN) IV, ondansetron, phenol  Assessment/Plan: Hypotension w/ Hx of HTN  Resolved.  Continue metoprolol 25 mg daily.  Resume furosemide at discharge.    Postop anemia  Patient required a transfusion multiple times during the course of hospital stay.  Hemoglobin stable since 08/30/2011.  Likely due to acute blood loss anemia and due to dilution. There is no outward evidence of significant blood loss on physical exam.  RIGHT HIP AVASCULAR NECROSIS W/ RETAINED HARDWARE  Per primary team   CAD  S/p stenting of LAD, circ, and RCA.  Continue Plavix, hold aspirin for now consider resuming aspirin as outpatient in 2 weeks.  PAfib  Currently in normal sinus rhythm.  Rate controlled. S/p pacer   Hx of TIA  Stable.  COPD  Well compensated at present   Acute exacerbation of Chronic renal failure  Baseline crt appears to be ~1.24-1.6, creatinine at baseline.  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  Will do stool occult, however suspect anemia is likely due to acute blood loss and due to recent surgery.    Thrombocytopenia  Pt is not on heparin products, stable.  Peripheral vascular disease  Carotid artery stenosis status post PCI on the right, abdominal abdominal aortic aneurysm, renal artery stenosis, 70% on the left.  Dysphasia  Stable.  Recent GI evaluation proved that she had esophageal dysmotility and there is no effective safe treatment for this elderly patient.  Constipation Continue MiraLAX.  Dispo  Patient stable from discharge from medical standpoint.  Will continue to follow as a consult.   LOS: 4 days  Dyanara Cozza A, MD 08/31/2011, 11:03 AM

## 2011-08-31 NOTE — Discharge Summary (Signed)
Patient ID: [name] MRN: [id number] DOB/AGE: 08-22-1917 78 y.o.  Admit date: 08/27/2011 Discharge date: 08/31/2011  Admission Diagnoses:  Principal Problem:  *S/P total hip arthroplasty Active Problems:  Hypotension  CAD (coronary artery disease)  Hypothyroid  UTI (lower urinary tract infection)  Thrombocytopenia  Constipation   Discharge Diagnoses:  Same  Past Medical History  Diagnosis Date  . GI bleed 2007  . Hypertension   . TIA (transient ischemic attack)   . Hypothyroidism   . Abdominal aortic aneurysm   . CAD (coronary artery disease)   . Pacemaker   . Myocardial infarction   . Atrial fibrillation   . COPD (chronic obstructive pulmonary disease)   . Asthma     hx  . Anemia     hx  . Blood transfusion   . UTI (lower urinary tract infection)     hx  . Renal failure, chronic   . H/O hiatal hernia   . GERD (gastroesophageal reflux disease)     Surgeries: Procedure(s): TOTAL HIP ARTHROPLASTY on 08/27/2011   Consultants: Treatment Team:  Lonia Blood, MD  Discharged Condition: Improved  Hospital Course: [name] is an 78 y.o. female who was admitted 08/27/2011 for operative treatment ofS/P total hip arthroplasty. Patient has severe unremitting pain that affects sleep, daily activities, and work/hobbies. After pre-op clearance the patient was taken to the operating room on 08/27/2011 and underwent  Procedure(s): TOTAL HIP ARTHROPLASTY.    Patient was given perioperative antibiotics: Anti-infectives     Start     Dose/Rate Route Frequency Ordered Stop   08/28/11 0200   cefTRIAXone (ROCEPHIN) 1 g in dextrose 5 % 50 mL IVPB        1 g 100 mL/hr over 30 Minutes Intravenous Daily at bedtime 08/28/11 0123 09/03/11 2159   08/26/11 1437   ceFAZolin (ANCEF) IVPB 2 g/50 mL premix        2 g 100 mL/hr over 30 Minutes Intravenous 60 min pre-op 08/26/11 1437 08/27/11 1152           Patient was given sequential compression devices, early  ambulation, and chemoprophylaxis to prevent DVT.  Patient benefited maximally from hospital stay and there were no complications.    Recent vital signs: Patient Vitals for the past 24 hrs:  BP Temp Temp src Pulse Resp SpO2  08/31/11 0548 103/58 mmHg 98.3 F (36.8 C) - 71  16  91 %  16-Sep-2011 2242 131/50 mmHg 98.2 F (36.8 C) - 79  18  94 %  09-16-11 1419 117/53 mmHg 98.6 F (37 C) Oral 70  16  94 %     Recent laboratory studies:  Basename 08/31/11 0500 2011/09/16 0350 08/29/11 0400  WBC 7.3 7.6 --  HGB 11.2* 11.6* --  HCT 33.8* 34.5* --  PLT 88* 86* --  NA -- 138 137  K -- 3.5 4.0  CL -- 105 104  CO2 -- 26 23  BUN -- 19 22  CREATININE -- 1.57* 1.88*  GLUCOSE -- 94 92  INR -- 1.01 --  CALCIUM -- 8.1* --     Discharge Medications:   Medication List  As of 08/31/2011 11:16 AM   STOP taking these medications         aspirin 81 MG tablet         TAKE these medications         amiodarone 200 MG tablet   Commonly known as: PACERONE   Take 200 mg by mouth  daily.      calcium-vitamin D 500-200 MG-UNIT per tablet   Commonly known as: OSCAL WITH D   Take 1 tablet by mouth daily.      cycloSPORINE 0.05 % ophthalmic emulsion   Commonly known as: RESTASIS   Place 1 drop into both eyes 2 (two) times daily.      ferrous sulfate 325 (65 FE) MG tablet   Take 325 mg by mouth daily with breakfast.      furosemide 20 MG tablet   Commonly known as: LASIX   Take 20 mg by mouth daily.      HYDROcodone-acetaminophen 5-325 MG per tablet   Commonly known as: NORCO   Take 0.5-1 tablets by mouth every 4 (four) hours as needed (breakthrough pain).      levothyroxine 125 MCG tablet   Commonly known as: SYNTHROID, LEVOTHROID   Take 125 mcg by mouth daily.      methocarbamol 500 MG tablet   Commonly known as: ROBAXIN   Take 1 tablet (500 mg total) by mouth every 6 (six) hours as needed.      metoprolol succinate 50 MG 24 hr tablet   Commonly known as: TOPROL-XL   Take 25 mg by  mouth daily. Take with or immediately following a meal.      PLAVIX 75 MG tablet   Generic drug: clopidogrel   Take 75 mg by mouth daily.      potassium chloride SA 20 MEQ tablet   Commonly known as: K-DUR,KLOR-CON   Take 10 mEq by mouth daily.      pravastatin 40 MG tablet   Commonly known as: PRAVACHOL   Take 40 mg by mouth at bedtime.      ranitidine 300 MG tablet   Commonly known as: ZANTAC   Take 300 mg by mouth at bedtime.      vitamin B-12 500 MCG tablet   Commonly known as: CYANOCOBALAMIN   Take 500 mcg by mouth daily.            Diagnostic Studies: Dg Chest 2 View  08/18/2011  *RADIOLOGY REPORT*  Clinical Data: Preop, hypertension.  CHEST - 2 VIEW  Comparison: 10/06/2007.  Findings: There is hyperinflation of the lungs compatible with COPD.  Cardiomegaly.  Left pacer remains in place, unchanged. Scarring in the apices.  No effusions.  No acute bony abnormality.  IMPRESSION: Severe COPD/chronic changes.  Cardiomegaly.  No change or acute findings.  Original Report Authenticated By: Cyndie Chime, M.D.   Dg Pelvis Portable  08/27/2011  *RADIOLOGY REPORT*  Clinical Data: Postop right hip replacement.  PORTABLE PELVIS  Comparison: None.  Findings: Frontal portable view the pelvis at 1422 hours shows a right total hip replacement.  Bony callus in the proximal femur with previous screw tracts are noted.  Acute abdomen film from earlier today shows the upper portion of a dynamic hip screw which has been retrieved.  IMPRESSION: Status post right total hip replacement.  No evidence for immediate hardware complications.  Original Report Authenticated By: ERIC A. MANSELL, M.D.   Dg Hip Portable 1 View Right  08/27/2011  *RADIOLOGY REPORT*  Clinical Data: Right hip replacement  PORTABLE RIGHT HIP - 1 VIEW  Comparison: None.  Findings: Cross-table portable view of the right hip obtained 1431 hours shows the femoral component to be located within the acetabular cup.  Gas within the  overlying soft tissues is compatible with the immediate postoperative state.  IMPRESSION: Status post right total hip replacement  without evidence for immediate hardware complications.  Original Report Authenticated By: ERIC A. MANSELL, M.D.    Disposition: 01-Home or Self Care  Discharge Orders    Future Orders Please Complete By Expires   Increase activity slowly      Walker       May shower / Bathe      Driving Restrictions      Comments:   No driving for 2 weeks.   Change dressing (specify)      Comments:   Dressing change as needed.   Call MD for:  temperature >100.4      Call MD for:  severe uncontrolled pain      Call MD for:  redness, tenderness, or signs of infection (pain, swelling, redness, odor or green/yellow discharge around incision site)      Discharge instructions      Comments:   F/U with Dr. Turner Daniels in 10 days.  WBAT, posterior hip precautions.         SignedHazle Nordmann. 08/31/2011, 11:16 AM

## 2011-09-01 DIAGNOSIS — M1610 Unilateral primary osteoarthritis, unspecified hip: Secondary | ICD-10-CM | POA: Diagnosis not present

## 2011-09-01 DIAGNOSIS — E039 Hypothyroidism, unspecified: Secondary | ICD-10-CM | POA: Diagnosis not present

## 2011-09-01 DIAGNOSIS — K5900 Constipation, unspecified: Secondary | ICD-10-CM | POA: Diagnosis not present

## 2011-09-01 DIAGNOSIS — I2510 Atherosclerotic heart disease of native coronary artery without angina pectoris: Secondary | ICD-10-CM | POA: Diagnosis not present

## 2011-09-16 DIAGNOSIS — R609 Edema, unspecified: Secondary | ICD-10-CM | POA: Diagnosis not present

## 2011-09-16 DIAGNOSIS — M79609 Pain in unspecified limb: Secondary | ICD-10-CM | POA: Diagnosis not present

## 2011-09-23 DIAGNOSIS — I4891 Unspecified atrial fibrillation: Secondary | ICD-10-CM | POA: Diagnosis not present

## 2011-09-23 DIAGNOSIS — I959 Hypotension, unspecified: Secondary | ICD-10-CM | POA: Diagnosis not present

## 2011-09-27 DIAGNOSIS — N189 Chronic kidney disease, unspecified: Secondary | ICD-10-CM | POA: Diagnosis not present

## 2011-10-06 DIAGNOSIS — F411 Generalized anxiety disorder: Secondary | ICD-10-CM | POA: Diagnosis not present

## 2011-10-06 DIAGNOSIS — N189 Chronic kidney disease, unspecified: Secondary | ICD-10-CM | POA: Diagnosis not present

## 2011-10-07 DIAGNOSIS — I2510 Atherosclerotic heart disease of native coronary artery without angina pectoris: Secondary | ICD-10-CM | POA: Diagnosis not present

## 2011-10-07 DIAGNOSIS — I4891 Unspecified atrial fibrillation: Secondary | ICD-10-CM | POA: Diagnosis not present

## 2011-10-07 DIAGNOSIS — E039 Hypothyroidism, unspecified: Secondary | ICD-10-CM | POA: Diagnosis not present

## 2011-10-07 DIAGNOSIS — M1610 Unilateral primary osteoarthritis, unspecified hip: Secondary | ICD-10-CM | POA: Diagnosis not present

## 2011-10-08 DIAGNOSIS — M25579 Pain in unspecified ankle and joints of unspecified foot: Secondary | ICD-10-CM | POA: Diagnosis not present

## 2011-10-08 DIAGNOSIS — M25559 Pain in unspecified hip: Secondary | ICD-10-CM | POA: Diagnosis not present

## 2011-10-12 DIAGNOSIS — Z4789 Encounter for other orthopedic aftercare: Secondary | ICD-10-CM | POA: Diagnosis not present

## 2011-10-12 DIAGNOSIS — J449 Chronic obstructive pulmonary disease, unspecified: Secondary | ICD-10-CM | POA: Diagnosis not present

## 2011-10-12 DIAGNOSIS — L89609 Pressure ulcer of unspecified heel, unspecified stage: Secondary | ICD-10-CM | POA: Diagnosis not present

## 2011-10-12 DIAGNOSIS — Z96649 Presence of unspecified artificial hip joint: Secondary | ICD-10-CM | POA: Diagnosis not present

## 2011-10-12 DIAGNOSIS — I2510 Atherosclerotic heart disease of native coronary artery without angina pectoris: Secondary | ICD-10-CM | POA: Diagnosis not present

## 2011-11-02 DIAGNOSIS — R6889 Other general symptoms and signs: Secondary | ICD-10-CM | POA: Diagnosis not present

## 2011-11-02 DIAGNOSIS — I2510 Atherosclerotic heart disease of native coronary artery without angina pectoris: Secondary | ICD-10-CM | POA: Diagnosis not present

## 2011-11-02 DIAGNOSIS — Z79899 Other long term (current) drug therapy: Secondary | ICD-10-CM | POA: Diagnosis not present

## 2011-11-02 DIAGNOSIS — R5381 Other malaise: Secondary | ICD-10-CM | POA: Diagnosis not present

## 2011-11-02 DIAGNOSIS — I4891 Unspecified atrial fibrillation: Secondary | ICD-10-CM | POA: Diagnosis not present

## 2011-11-02 DIAGNOSIS — Z45018 Encounter for adjustment and management of other part of cardiac pacemaker: Secondary | ICD-10-CM | POA: Diagnosis not present

## 2011-11-02 DIAGNOSIS — I495 Sick sinus syndrome: Secondary | ICD-10-CM | POA: Diagnosis not present

## 2011-11-29 DIAGNOSIS — M25559 Pain in unspecified hip: Secondary | ICD-10-CM | POA: Diagnosis not present

## 2011-11-29 DIAGNOSIS — M25569 Pain in unspecified knee: Secondary | ICD-10-CM | POA: Diagnosis not present

## 2011-12-11 DIAGNOSIS — J449 Chronic obstructive pulmonary disease, unspecified: Secondary | ICD-10-CM | POA: Diagnosis not present

## 2011-12-11 DIAGNOSIS — Z4789 Encounter for other orthopedic aftercare: Secondary | ICD-10-CM | POA: Diagnosis not present

## 2011-12-11 DIAGNOSIS — Z96649 Presence of unspecified artificial hip joint: Secondary | ICD-10-CM | POA: Diagnosis not present

## 2011-12-11 DIAGNOSIS — L89609 Pressure ulcer of unspecified heel, unspecified stage: Secondary | ICD-10-CM | POA: Diagnosis not present

## 2011-12-11 DIAGNOSIS — I2510 Atherosclerotic heart disease of native coronary artery without angina pectoris: Secondary | ICD-10-CM | POA: Diagnosis not present

## 2011-12-14 DIAGNOSIS — J449 Chronic obstructive pulmonary disease, unspecified: Secondary | ICD-10-CM | POA: Diagnosis not present

## 2011-12-14 DIAGNOSIS — L89609 Pressure ulcer of unspecified heel, unspecified stage: Secondary | ICD-10-CM | POA: Diagnosis not present

## 2011-12-14 DIAGNOSIS — Z4789 Encounter for other orthopedic aftercare: Secondary | ICD-10-CM | POA: Diagnosis not present

## 2011-12-14 DIAGNOSIS — Z96649 Presence of unspecified artificial hip joint: Secondary | ICD-10-CM | POA: Diagnosis not present

## 2011-12-14 DIAGNOSIS — I2510 Atherosclerotic heart disease of native coronary artery without angina pectoris: Secondary | ICD-10-CM | POA: Diagnosis not present

## 2011-12-16 DIAGNOSIS — Z4789 Encounter for other orthopedic aftercare: Secondary | ICD-10-CM | POA: Diagnosis not present

## 2011-12-16 DIAGNOSIS — Z23 Encounter for immunization: Secondary | ICD-10-CM | POA: Diagnosis not present

## 2011-12-16 DIAGNOSIS — Z96649 Presence of unspecified artificial hip joint: Secondary | ICD-10-CM | POA: Diagnosis not present

## 2011-12-16 DIAGNOSIS — E039 Hypothyroidism, unspecified: Secondary | ICD-10-CM | POA: Diagnosis not present

## 2011-12-16 DIAGNOSIS — J449 Chronic obstructive pulmonary disease, unspecified: Secondary | ICD-10-CM | POA: Diagnosis not present

## 2011-12-16 DIAGNOSIS — I2510 Atherosclerotic heart disease of native coronary artery without angina pectoris: Secondary | ICD-10-CM | POA: Diagnosis not present

## 2011-12-16 DIAGNOSIS — I6529 Occlusion and stenosis of unspecified carotid artery: Secondary | ICD-10-CM | POA: Diagnosis not present

## 2011-12-16 DIAGNOSIS — I6789 Other cerebrovascular disease: Secondary | ICD-10-CM | POA: Diagnosis not present

## 2011-12-16 DIAGNOSIS — L89609 Pressure ulcer of unspecified heel, unspecified stage: Secondary | ICD-10-CM | POA: Diagnosis not present

## 2011-12-17 DIAGNOSIS — L89609 Pressure ulcer of unspecified heel, unspecified stage: Secondary | ICD-10-CM | POA: Diagnosis not present

## 2011-12-17 DIAGNOSIS — Z4789 Encounter for other orthopedic aftercare: Secondary | ICD-10-CM | POA: Diagnosis not present

## 2011-12-17 DIAGNOSIS — Z96649 Presence of unspecified artificial hip joint: Secondary | ICD-10-CM | POA: Diagnosis not present

## 2011-12-17 DIAGNOSIS — J449 Chronic obstructive pulmonary disease, unspecified: Secondary | ICD-10-CM | POA: Diagnosis not present

## 2011-12-17 DIAGNOSIS — I2510 Atherosclerotic heart disease of native coronary artery without angina pectoris: Secondary | ICD-10-CM | POA: Diagnosis not present

## 2011-12-21 DIAGNOSIS — J449 Chronic obstructive pulmonary disease, unspecified: Secondary | ICD-10-CM | POA: Diagnosis not present

## 2011-12-21 DIAGNOSIS — Z96649 Presence of unspecified artificial hip joint: Secondary | ICD-10-CM | POA: Diagnosis not present

## 2011-12-21 DIAGNOSIS — I2510 Atherosclerotic heart disease of native coronary artery without angina pectoris: Secondary | ICD-10-CM | POA: Diagnosis not present

## 2011-12-21 DIAGNOSIS — Z4789 Encounter for other orthopedic aftercare: Secondary | ICD-10-CM | POA: Diagnosis not present

## 2011-12-21 DIAGNOSIS — L89609 Pressure ulcer of unspecified heel, unspecified stage: Secondary | ICD-10-CM | POA: Diagnosis not present

## 2011-12-22 DIAGNOSIS — J449 Chronic obstructive pulmonary disease, unspecified: Secondary | ICD-10-CM | POA: Diagnosis not present

## 2011-12-22 DIAGNOSIS — I2510 Atherosclerotic heart disease of native coronary artery without angina pectoris: Secondary | ICD-10-CM | POA: Diagnosis not present

## 2011-12-22 DIAGNOSIS — L89609 Pressure ulcer of unspecified heel, unspecified stage: Secondary | ICD-10-CM | POA: Diagnosis not present

## 2011-12-22 DIAGNOSIS — Z4789 Encounter for other orthopedic aftercare: Secondary | ICD-10-CM | POA: Diagnosis not present

## 2011-12-22 DIAGNOSIS — Z96649 Presence of unspecified artificial hip joint: Secondary | ICD-10-CM | POA: Diagnosis not present

## 2011-12-24 DIAGNOSIS — L89609 Pressure ulcer of unspecified heel, unspecified stage: Secondary | ICD-10-CM | POA: Diagnosis not present

## 2011-12-24 DIAGNOSIS — Z4789 Encounter for other orthopedic aftercare: Secondary | ICD-10-CM | POA: Diagnosis not present

## 2011-12-24 DIAGNOSIS — J449 Chronic obstructive pulmonary disease, unspecified: Secondary | ICD-10-CM | POA: Diagnosis not present

## 2011-12-24 DIAGNOSIS — I2510 Atherosclerotic heart disease of native coronary artery without angina pectoris: Secondary | ICD-10-CM | POA: Diagnosis not present

## 2011-12-24 DIAGNOSIS — Z96649 Presence of unspecified artificial hip joint: Secondary | ICD-10-CM | POA: Diagnosis not present

## 2011-12-28 DIAGNOSIS — J449 Chronic obstructive pulmonary disease, unspecified: Secondary | ICD-10-CM | POA: Diagnosis not present

## 2011-12-28 DIAGNOSIS — I2510 Atherosclerotic heart disease of native coronary artery without angina pectoris: Secondary | ICD-10-CM | POA: Diagnosis not present

## 2011-12-28 DIAGNOSIS — Z4789 Encounter for other orthopedic aftercare: Secondary | ICD-10-CM | POA: Diagnosis not present

## 2011-12-28 DIAGNOSIS — L89609 Pressure ulcer of unspecified heel, unspecified stage: Secondary | ICD-10-CM | POA: Diagnosis not present

## 2011-12-28 DIAGNOSIS — Z96649 Presence of unspecified artificial hip joint: Secondary | ICD-10-CM | POA: Diagnosis not present

## 2011-12-30 DIAGNOSIS — L89609 Pressure ulcer of unspecified heel, unspecified stage: Secondary | ICD-10-CM | POA: Diagnosis not present

## 2011-12-30 DIAGNOSIS — Z96649 Presence of unspecified artificial hip joint: Secondary | ICD-10-CM | POA: Diagnosis not present

## 2011-12-30 DIAGNOSIS — I2510 Atherosclerotic heart disease of native coronary artery without angina pectoris: Secondary | ICD-10-CM | POA: Diagnosis not present

## 2011-12-30 DIAGNOSIS — J449 Chronic obstructive pulmonary disease, unspecified: Secondary | ICD-10-CM | POA: Diagnosis not present

## 2011-12-30 DIAGNOSIS — Z4789 Encounter for other orthopedic aftercare: Secondary | ICD-10-CM | POA: Diagnosis not present

## 2012-01-05 DIAGNOSIS — J449 Chronic obstructive pulmonary disease, unspecified: Secondary | ICD-10-CM | POA: Diagnosis not present

## 2012-01-05 DIAGNOSIS — L89609 Pressure ulcer of unspecified heel, unspecified stage: Secondary | ICD-10-CM | POA: Diagnosis not present

## 2012-01-05 DIAGNOSIS — Z96649 Presence of unspecified artificial hip joint: Secondary | ICD-10-CM | POA: Diagnosis not present

## 2012-01-05 DIAGNOSIS — Z4789 Encounter for other orthopedic aftercare: Secondary | ICD-10-CM | POA: Diagnosis not present

## 2012-01-05 DIAGNOSIS — I2510 Atherosclerotic heart disease of native coronary artery without angina pectoris: Secondary | ICD-10-CM | POA: Diagnosis not present

## 2012-01-07 DIAGNOSIS — L89609 Pressure ulcer of unspecified heel, unspecified stage: Secondary | ICD-10-CM | POA: Diagnosis not present

## 2012-01-07 DIAGNOSIS — Z4789 Encounter for other orthopedic aftercare: Secondary | ICD-10-CM | POA: Diagnosis not present

## 2012-01-07 DIAGNOSIS — Z96649 Presence of unspecified artificial hip joint: Secondary | ICD-10-CM | POA: Diagnosis not present

## 2012-01-07 DIAGNOSIS — I2510 Atherosclerotic heart disease of native coronary artery without angina pectoris: Secondary | ICD-10-CM | POA: Diagnosis not present

## 2012-01-07 DIAGNOSIS — J449 Chronic obstructive pulmonary disease, unspecified: Secondary | ICD-10-CM | POA: Diagnosis not present

## 2012-01-11 DIAGNOSIS — Z96649 Presence of unspecified artificial hip joint: Secondary | ICD-10-CM | POA: Diagnosis not present

## 2012-01-11 DIAGNOSIS — Z4789 Encounter for other orthopedic aftercare: Secondary | ICD-10-CM | POA: Diagnosis not present

## 2012-01-11 DIAGNOSIS — L89609 Pressure ulcer of unspecified heel, unspecified stage: Secondary | ICD-10-CM | POA: Diagnosis not present

## 2012-01-11 DIAGNOSIS — I2510 Atherosclerotic heart disease of native coronary artery without angina pectoris: Secondary | ICD-10-CM | POA: Diagnosis not present

## 2012-01-11 DIAGNOSIS — J449 Chronic obstructive pulmonary disease, unspecified: Secondary | ICD-10-CM | POA: Diagnosis not present

## 2012-01-14 DIAGNOSIS — Z96649 Presence of unspecified artificial hip joint: Secondary | ICD-10-CM | POA: Diagnosis not present

## 2012-01-14 DIAGNOSIS — I2510 Atherosclerotic heart disease of native coronary artery without angina pectoris: Secondary | ICD-10-CM | POA: Diagnosis not present

## 2012-01-14 DIAGNOSIS — Z4789 Encounter for other orthopedic aftercare: Secondary | ICD-10-CM | POA: Diagnosis not present

## 2012-01-14 DIAGNOSIS — L89609 Pressure ulcer of unspecified heel, unspecified stage: Secondary | ICD-10-CM | POA: Diagnosis not present

## 2012-01-14 DIAGNOSIS — J449 Chronic obstructive pulmonary disease, unspecified: Secondary | ICD-10-CM | POA: Diagnosis not present

## 2012-01-18 DIAGNOSIS — I2510 Atherosclerotic heart disease of native coronary artery without angina pectoris: Secondary | ICD-10-CM | POA: Diagnosis not present

## 2012-01-18 DIAGNOSIS — Z96649 Presence of unspecified artificial hip joint: Secondary | ICD-10-CM | POA: Diagnosis not present

## 2012-01-18 DIAGNOSIS — Z4789 Encounter for other orthopedic aftercare: Secondary | ICD-10-CM | POA: Diagnosis not present

## 2012-01-18 DIAGNOSIS — L89609 Pressure ulcer of unspecified heel, unspecified stage: Secondary | ICD-10-CM | POA: Diagnosis not present

## 2012-01-18 DIAGNOSIS — J449 Chronic obstructive pulmonary disease, unspecified: Secondary | ICD-10-CM | POA: Diagnosis not present

## 2012-01-21 DIAGNOSIS — I2510 Atherosclerotic heart disease of native coronary artery without angina pectoris: Secondary | ICD-10-CM | POA: Diagnosis not present

## 2012-01-21 DIAGNOSIS — Z96649 Presence of unspecified artificial hip joint: Secondary | ICD-10-CM | POA: Diagnosis not present

## 2012-01-21 DIAGNOSIS — J449 Chronic obstructive pulmonary disease, unspecified: Secondary | ICD-10-CM | POA: Diagnosis not present

## 2012-01-21 DIAGNOSIS — L89609 Pressure ulcer of unspecified heel, unspecified stage: Secondary | ICD-10-CM | POA: Diagnosis not present

## 2012-01-21 DIAGNOSIS — Z4789 Encounter for other orthopedic aftercare: Secondary | ICD-10-CM | POA: Diagnosis not present

## 2012-02-24 DIAGNOSIS — M25559 Pain in unspecified hip: Secondary | ICD-10-CM | POA: Diagnosis not present

## 2012-02-24 DIAGNOSIS — M6580 Other synovitis and tenosynovitis, unspecified site: Secondary | ICD-10-CM | POA: Diagnosis not present

## 2012-03-24 DIAGNOSIS — R6889 Other general symptoms and signs: Secondary | ICD-10-CM | POA: Diagnosis not present

## 2012-03-24 DIAGNOSIS — E782 Mixed hyperlipidemia: Secondary | ICD-10-CM | POA: Diagnosis not present

## 2012-03-24 DIAGNOSIS — R609 Edema, unspecified: Secondary | ICD-10-CM | POA: Diagnosis not present

## 2012-03-24 DIAGNOSIS — R5383 Other fatigue: Secondary | ICD-10-CM | POA: Diagnosis not present

## 2012-03-24 DIAGNOSIS — I2510 Atherosclerotic heart disease of native coronary artery without angina pectoris: Secondary | ICD-10-CM | POA: Diagnosis not present

## 2012-06-02 DIAGNOSIS — H02059 Trichiasis without entropian unspecified eye, unspecified eyelid: Secondary | ICD-10-CM | POA: Diagnosis not present

## 2012-06-02 DIAGNOSIS — H04129 Dry eye syndrome of unspecified lacrimal gland: Secondary | ICD-10-CM | POA: Diagnosis not present

## 2012-06-02 DIAGNOSIS — Z961 Presence of intraocular lens: Secondary | ICD-10-CM | POA: Diagnosis not present

## 2012-06-02 DIAGNOSIS — H16209 Unspecified keratoconjunctivitis, unspecified eye: Secondary | ICD-10-CM | POA: Diagnosis not present

## 2012-07-18 DIAGNOSIS — I495 Sick sinus syndrome: Secondary | ICD-10-CM | POA: Diagnosis not present

## 2012-07-18 DIAGNOSIS — I2510 Atherosclerotic heart disease of native coronary artery without angina pectoris: Secondary | ICD-10-CM | POA: Diagnosis not present

## 2012-07-18 DIAGNOSIS — Z79899 Other long term (current) drug therapy: Secondary | ICD-10-CM | POA: Diagnosis not present

## 2012-07-18 DIAGNOSIS — I4891 Unspecified atrial fibrillation: Secondary | ICD-10-CM | POA: Diagnosis not present

## 2012-07-18 DIAGNOSIS — R5383 Other fatigue: Secondary | ICD-10-CM | POA: Diagnosis not present

## 2012-07-18 DIAGNOSIS — Z45018 Encounter for adjustment and management of other part of cardiac pacemaker: Secondary | ICD-10-CM | POA: Diagnosis not present

## 2012-07-20 ENCOUNTER — Other Ambulatory Visit (HOSPITAL_COMMUNITY): Payer: Self-pay | Admitting: Cardiovascular Disease

## 2012-07-20 DIAGNOSIS — I714 Abdominal aortic aneurysm, without rupture: Secondary | ICD-10-CM

## 2012-07-28 ENCOUNTER — Ambulatory Visit (HOSPITAL_COMMUNITY)
Admission: RE | Admit: 2012-07-28 | Discharge: 2012-07-28 | Disposition: A | Discharge status: Home / Self Care | Payer: Medicare Other | Source: Ambulatory Visit | Attending: Cardiovascular Disease | Admitting: Cardiovascular Disease

## 2012-07-28 DIAGNOSIS — I714 Abdominal aortic aneurysm, without rupture, unspecified: Secondary | ICD-10-CM | POA: Insufficient documentation

## 2012-07-28 NOTE — Progress Notes (Signed)
Abdominal Duplex Completed. [name]

## 2012-09-22 DIAGNOSIS — H6120 Impacted cerumen, unspecified ear: Secondary | ICD-10-CM | POA: Diagnosis not present

## 2012-11-01 ENCOUNTER — Telehealth: Payer: Self-pay | Admitting: Cardiovascular Disease

## 2012-11-01 MED ORDER — METOPROLOL SUCCINATE ER 50 MG PO TB24: ORAL_TABLET | ORAL | Status: DC

## 2012-11-01 NOTE — Telephone Encounter (Signed)
Need refill on Metoprolol 25 mg #15

## 2012-11-01 NOTE — Telephone Encounter (Signed)
Returned call to clarify if pt has metoprolol succ or tart.  Informed succ.  Will send electronically.  Verbalized understanding.  Refill(s) sent to pharmacy via [employer].

## 2012-12-01 ENCOUNTER — Other Ambulatory Visit: Payer: Self-pay | Admitting: Cardiovascular Disease

## 2012-12-01 ENCOUNTER — Encounter: Payer: Self-pay | Admitting: Cardiovascular Disease

## 2012-12-01 DIAGNOSIS — R6889 Other general symptoms and signs: Secondary | ICD-10-CM | POA: Diagnosis not present

## 2012-12-01 DIAGNOSIS — E782 Mixed hyperlipidemia: Secondary | ICD-10-CM | POA: Diagnosis not present

## 2012-12-01 DIAGNOSIS — R5381 Other malaise: Secondary | ICD-10-CM | POA: Diagnosis not present

## 2012-12-01 DIAGNOSIS — I4891 Unspecified atrial fibrillation: Secondary | ICD-10-CM | POA: Diagnosis not present

## 2012-12-01 DIAGNOSIS — I2510 Atherosclerotic heart disease of native coronary artery without angina pectoris: Secondary | ICD-10-CM | POA: Diagnosis not present

## 2012-12-01 DIAGNOSIS — K219 Gastro-esophageal reflux disease without esophagitis: Secondary | ICD-10-CM | POA: Diagnosis not present

## 2012-12-01 LAB — COMPREHENSIVE METABOLIC PANEL
Albumin: 3.8 g/dL (ref 3.5–5.2)
CO2: 36 mEq/L — ABNORMAL HIGH (ref 19–32)
Calcium: 9.3 mg/dL (ref 8.4–10.5)
Chloride: 101 mEq/L (ref 96–112)
Glucose, Bld: 73 mg/dL (ref 70–99)
Sodium: 141 mEq/L (ref 135–145)
Total Bilirubin: 0.4 mg/dL (ref 0.3–1.2)
Total Protein: 6.4 g/dL (ref 6.0–8.3)

## 2012-12-01 LAB — LIPID PANEL
Cholesterol: 182 mg/dL (ref 0–200)
Triglycerides: 118 mg/dL (ref <150)
VLDL: 24 mg/dL (ref 0–40)

## 2012-12-01 LAB — CBC WITH DIFFERENTIAL/PLATELET
Eosinophils Absolute: 0.2 10*3/uL (ref 0.0–0.7)
Hemoglobin: 13.2 g/dL (ref 12.0–15.0)
Lymphs Abs: 1.4 10*3/uL (ref 0.7–4.0)
MCH: 32.2 pg (ref 26.0–34.0)
MCV: 97.8 fL (ref 78.0–100.0)
Monocytes Relative: 10 % (ref 3–12)
Neutrophils Relative %: 64 % (ref 43–77)
RBC: 4.1 MIL/uL (ref 3.87–5.11)

## 2012-12-01 LAB — PACEMAKER DEVICE OBSERVATION

## 2012-12-04 ENCOUNTER — Other Ambulatory Visit (HOSPITAL_COMMUNITY): Payer: Self-pay | Admitting: Cardiovascular Disease

## 2012-12-04 DIAGNOSIS — I272 Pulmonary hypertension, unspecified: Secondary | ICD-10-CM

## 2012-12-04 DIAGNOSIS — I739 Peripheral vascular disease, unspecified: Secondary | ICD-10-CM

## 2012-12-04 DIAGNOSIS — R0989 Other specified symptoms and signs involving the circulatory and respiratory systems: Secondary | ICD-10-CM

## 2012-12-06 ENCOUNTER — Telehealth: Payer: Self-pay | Admitting: Cardiovascular Disease

## 2012-12-06 NOTE — Telephone Encounter (Signed)
Returned call.  Left message asking that hardy copy request be faxed to [phone] for refills.  

## 2012-12-06 NOTE — Telephone Encounter (Signed)
Need a refill on Pravastatin 40 mg #90

## 2012-12-13 ENCOUNTER — Ambulatory Visit (HOSPITAL_BASED_OUTPATIENT_CLINIC_OR_DEPARTMENT_OTHER)
Admission: RE | Admit: 2012-12-13 | Discharge: 2012-12-13 | Disposition: A | Discharge status: Home / Self Care | Payer: Medicare Other | Source: Ambulatory Visit | Attending: Cardiology | Admitting: Cardiology

## 2012-12-13 ENCOUNTER — Ambulatory Visit (HOSPITAL_COMMUNITY)
Admission: RE | Admit: 2012-12-13 | Discharge: 2012-12-13 | Disposition: A | Discharge status: Home / Self Care | Payer: Medicare Other | Source: Ambulatory Visit | Attending: Cardiology | Admitting: Cardiology

## 2012-12-13 DIAGNOSIS — Z23 Encounter for immunization: Secondary | ICD-10-CM | POA: Diagnosis not present

## 2012-12-13 DIAGNOSIS — R0989 Other specified symptoms and signs involving the circulatory and respiratory systems: Secondary | ICD-10-CM

## 2012-12-13 DIAGNOSIS — I270 Primary pulmonary hypertension: Secondary | ICD-10-CM | POA: Diagnosis not present

## 2012-12-13 DIAGNOSIS — I2789 Other specified pulmonary heart diseases: Secondary | ICD-10-CM | POA: Insufficient documentation

## 2012-12-13 DIAGNOSIS — I739 Peripheral vascular disease, unspecified: Secondary | ICD-10-CM

## 2012-12-13 DIAGNOSIS — I272 Pulmonary hypertension, unspecified: Secondary | ICD-10-CM

## 2012-12-13 NOTE — Progress Notes (Signed)
Lower Extremity Arterial Duplex Completed. °Brianna L Mazza,RVT °

## 2012-12-13 NOTE — Progress Notes (Signed)
2D Echo Performed 12/13/2012    Tyliyah Mcmeekin, RCS  

## 2012-12-13 NOTE — Progress Notes (Signed)
Carotid Duplex Completed. °Brianna L Mazza,RVT °

## 2012-12-14 ENCOUNTER — Encounter: Payer: Self-pay | Admitting: Cardiovascular Disease

## 2013-02-14 DIAGNOSIS — B370 Candidal stomatitis: Secondary | ICD-10-CM | POA: Diagnosis not present

## 2013-02-14 DIAGNOSIS — Z7901 Long term (current) use of anticoagulants: Secondary | ICD-10-CM | POA: Diagnosis not present

## 2013-02-14 DIAGNOSIS — K219 Gastro-esophageal reflux disease without esophagitis: Secondary | ICD-10-CM | POA: Diagnosis not present

## 2013-03-20 ENCOUNTER — Telehealth: Payer: Self-pay | Admitting: *Deleted

## 2013-03-20 MED ORDER — AMIODARONE HCL 200 MG PO TABS: 200.0000 mg | ORAL_TABLET | Freq: Every day | ORAL | Status: DC

## 2013-03-20 NOTE — Telephone Encounter (Signed)
Pt's daughter called in regards to [name] Amiodarone 200mg . She stated that [facility] was supposed to send in a Rx refill and they have not heard anything back yet.

## 2013-03-20 NOTE — Telephone Encounter (Signed)
Returned call and pt verified x 2 w/ [name], pt's daughter.  Informed message received and RN needs pharmacy to send refill.  Also informed that pharmacy must fax a hard copy request to office for prescriptions that Dr. Rollene Fare previously prescribed.  Verbalized understanding and agreed w/ plan.  Refill(s) sent to pharmacy.

## 2013-04-02 ENCOUNTER — Other Ambulatory Visit: Payer: Self-pay | Admitting: *Deleted

## 2013-04-02 MED ORDER — LEVOTHYROXINE SODIUM 125 MCG PO TABS: 125.0000 ug | ORAL_TABLET | Freq: Every day | ORAL | Status: DC

## 2013-04-09 ENCOUNTER — Other Ambulatory Visit: Payer: Self-pay | Admitting: Cardiovascular Disease

## 2013-04-24 ENCOUNTER — Encounter: Payer: Self-pay | Admitting: *Deleted

## 2013-04-25 ENCOUNTER — Encounter: Payer: Self-pay | Admitting: Cardiovascular Disease

## 2013-04-25 ENCOUNTER — Ambulatory Visit (INDEPENDENT_AMBULATORY_CARE_PROVIDER_SITE_OTHER): Payer: Medicare Other | Admitting: Cardiovascular Disease

## 2013-04-25 VITALS — BP 116/60 | HR 95 | Resp 20 | Ht 62.5 in | Wt 119.1 lb

## 2013-04-25 DIAGNOSIS — I658 Occlusion and stenosis of other precerebral arteries: Secondary | ICD-10-CM

## 2013-04-25 DIAGNOSIS — Z950 Presence of cardiac pacemaker: Secondary | ICD-10-CM

## 2013-04-25 DIAGNOSIS — I2510 Atherosclerotic heart disease of native coronary artery without angina pectoris: Secondary | ICD-10-CM | POA: Diagnosis not present

## 2013-04-25 DIAGNOSIS — I4891 Unspecified atrial fibrillation: Secondary | ICD-10-CM | POA: Diagnosis not present

## 2013-04-25 DIAGNOSIS — I6529 Occlusion and stenosis of unspecified carotid artery: Secondary | ICD-10-CM

## 2013-04-25 DIAGNOSIS — I739 Peripheral vascular disease, unspecified: Secondary | ICD-10-CM

## 2013-04-25 DIAGNOSIS — E785 Hyperlipidemia, unspecified: Secondary | ICD-10-CM | POA: Diagnosis not present

## 2013-04-25 DIAGNOSIS — I480 Paroxysmal atrial fibrillation: Secondary | ICD-10-CM

## 2013-04-25 DIAGNOSIS — I714 Abdominal aortic aneurysm, without rupture, unspecified: Secondary | ICD-10-CM

## 2013-04-25 DIAGNOSIS — I6523 Occlusion and stenosis of bilateral carotid arteries: Secondary | ICD-10-CM

## 2013-04-25 DIAGNOSIS — I495 Sick sinus syndrome: Secondary | ICD-10-CM | POA: Diagnosis not present

## 2013-04-25 LAB — PACEMAKER DEVICE OBSERVATION

## 2013-04-25 LAB — MDC_IDC_ENUM_SESS_TYPE_INCLINIC
Battery Impedance: 2131 Ohm
Battery Remaining Longevity: 30 mo
Battery Voltage: 2.74 V
Brady Statistic AP VS Percent: 95 %
Brady Statistic AS VP Percent: 0 %
Date Time Interrogation Session: 20150211172203
Lead Channel Impedance Value: 503 Ohm
Lead Channel Pacing Threshold Amplitude: 0.75 V
Lead Channel Pacing Threshold Pulse Width: 0.4 ms
Lead Channel Sensing Intrinsic Amplitude: 2.8 mV
Lead Channel Setting Pacing Amplitude: 2.5 V
Lead Channel Setting Pacing Pulse Width: 0.4 ms
MDC IDC MSMT LEADCHNL RV IMPEDANCE VALUE: 507 Ohm
MDC IDC MSMT LEADCHNL RV PACING THRESHOLD AMPLITUDE: 0.75 V
MDC IDC MSMT LEADCHNL RV PACING THRESHOLD PULSEWIDTH: 0.4 ms
MDC IDC MSMT LEADCHNL RV SENSING INTR AMPL: 11.2 mV
MDC IDC SET LEADCHNL RA PACING AMPLITUDE: 2 V
MDC IDC SET LEADCHNL RV SENSING SENSITIVITY: 4 mV
MDC IDC STAT BRADY AP VP PERCENT: 4 %
MDC IDC STAT BRADY AS VS PERCENT: 0 %

## 2013-04-25 MED ORDER — ROSUVASTATIN CALCIUM 5 MG PO TABS: 5.0000 mg | ORAL_TABLET | ORAL | Status: DC

## 2013-04-25 NOTE — Patient Instructions (Signed)
START Crestor 5mg  take one tablet once a week.  Remote monitoring is used to monitor your Pacemaker of ICD from home. This monitoring reduces the number of office visits required to check your device to one time per year. It allows [place] to keep an eye on the functioning of your device to ensure it is working properly. You are scheduled for a device check from home on 07/30/13. You may send your transmission at any time that day. If you have a wireless device, the transmission will be sent automatically. After your physician reviews your transmission, you will receive a postcard with your next transmission date.  Your physician recommends that you schedule a follow-up appointment in: 6 months with Dr. Sallyanne Kuster.

## 2013-04-27 DIAGNOSIS — Z961 Presence of intraocular lens: Secondary | ICD-10-CM | POA: Diagnosis not present

## 2013-04-27 DIAGNOSIS — H02059 Trichiasis without entropian unspecified eye, unspecified eyelid: Secondary | ICD-10-CM | POA: Diagnosis not present

## 2013-04-27 DIAGNOSIS — H04129 Dry eye syndrome of unspecified lacrimal gland: Secondary | ICD-10-CM | POA: Diagnosis not present

## 2013-04-27 DIAGNOSIS — H579 Unspecified disorder of eye and adnexa: Secondary | ICD-10-CM | POA: Diagnosis not present

## 2013-04-30 ENCOUNTER — Encounter: Payer: Self-pay | Admitting: Cardiovascular Disease

## 2013-04-30 DIAGNOSIS — I6523 Occlusion and stenosis of bilateral carotid arteries: Secondary | ICD-10-CM | POA: Insufficient documentation

## 2013-04-30 DIAGNOSIS — E785 Hyperlipidemia, unspecified: Secondary | ICD-10-CM | POA: Insufficient documentation

## 2013-04-30 DIAGNOSIS — I739 Peripheral vascular disease, unspecified: Secondary | ICD-10-CM | POA: Insufficient documentation

## 2013-04-30 DIAGNOSIS — I714 Abdominal aortic aneurysm, without rupture, unspecified: Secondary | ICD-10-CM | POA: Insufficient documentation

## 2013-04-30 DIAGNOSIS — Z950 Presence of cardiac pacemaker: Secondary | ICD-10-CM | POA: Insufficient documentation

## 2013-04-30 DIAGNOSIS — I4819 Other persistent atrial fibrillation: Secondary | ICD-10-CM | POA: Insufficient documentation

## 2013-04-30 NOTE — Assessment & Plan Note (Addendum)
Amiodarone seems to be doing a good job of controlling her arrhythmia. No significant atrial fibrillation since the 5 hour episode that occurred in May of 2014. Her risk of embolic events is high, as is her risk of bleeding complications. This has previously been thought out and discussed with the patient and her family. She is on clopidogrel and is not felt to be a good candidate for warfarin or a novel anticoagulant. Check. Liver function tests, thyroid function tests and monitor for pulmonary complications in the newborn period

## 2013-04-30 NOTE — Assessment & Plan Note (Signed)
Pacemaker check in clinic. Normal device function. Thresholds, sensing, impedances consistent with previous measurements. Device programmed to maximize longevity. Very low burden of atrial arrhythmia now (<0.1%)---8 AHR episodes---max dur. 1 hr 27 mins, Max A 293, Max V 145---AT/AFL+Plavix (APP+PMOP). No high ventricular rates noted. Device programmed at appropriate safety margins. Histogram distribution appropriate for patient activity level. Device programmed to optimize intrinsic conduction. Estimated longevity 2.5 years. Patient will follow up via [facility] on 07-30-2013 and with [facility] in 6 months.

## 2013-04-30 NOTE — Assessment & Plan Note (Signed)
Complicated history of multiple percutaneous revascularization procedures and residual stenosis of the left main coronary artery. Thankfully right now she is asymptomatic and her most recent functional study shows low risk findings. I would be hard pressed to ever recommend further revascularization procedures in this nonagenarian. Focus on symptom control and risk factor treatment.

## 2013-04-30 NOTE — Progress Notes (Signed)
Patient ID: [name], female   DOB: Jan 30, 1918, 80 y.o.   MRN: [id number]      Reason for office visit CAD, atrial fibrillation, permanent pacemaker followup, AAA  [name] is a patient of Dr. Georgiann Mccoy for about 20 years. She is here to establish new followup after his time in. She has an extensive and very complicated past history.  She has coronary disease. In 1997 she underwent urgent stenting of the proximal LAD artery for NSTEMI and then underwent a staged stent to the left circumflex coronary artery, complicated by frequent nonsustained ventricular tachycardia. A planned staged intervention to the right coronary artery was therefore delayed, performed later the same year.  She required repeat stenting of all 3 coronary arteries in 2006, when she presented with unstable angina. These procedures were performed on 3 consecutive days and a rather complicated fashion. Drug-eluting stents were placed in the proximal LAD and proximal RCA, with bare-metal stents in the proximal left circumflex and mid LAD. Is made of residual 50% stenosis of the left main coronary artery, but she has not required any revascularization procedure since.  Her last nuclear stress test performed in 2012 and showed low risk findings.  She has an infrarenal abdominal aortic aneurysm that has been roughly 3 cm in maximum diameter for the last 18 years without much progression. Most recent ultrasound in May of 2014 measured at 3.6 cm. Is received a previous stent to the right common iliac artery and has mild bilateral stenoses in both iliac arteries by her most recent ultrasound performed in 2014. She does not have intermittent claudication. Bilateral ABIs were greater than 1.0. Is on 2014 shows found to have stable less than 50% bilateral internal carotid artery lesions, also unchanged for several years.  She has a history of sinus node dysfunction and paroxysmal atrial fibrillation and received a  dual-chamber Medtronic adapta pacemaker in 2009. Last pacemaker check showed a low burden of atrial fibrillation of less than 1%, with 1 episode of atrial fibrillation that lasted for over 5 hours occurring last in May. Atrial pacing occurs virtually 100% of the time. Ventricular pacing occurs only 4% of the time.  She has been intolerance to numerous statins including pravastatin, atorvastatin, simvastatin 10 daily Crestor. She has not tried to take Crestor once a week. She failed multi-antiarrhythmic therapy and is now receiving amiodarone and a low dose.  Significant comorbidities also include treated hypothyroidism and hyperlipidemia. She does not have diabetes mellitus and quit smoking decades ago. She has had multiple repeat surgeries for her left hip with the most recent revision being in June of 2013. She uses a walker and occasionally gets around with a cane.  She denies angina. She has occasional shortness of breath and occasional ankle swelling. She was then able to tolerate pravastatin, the most recent lipid lowering agent she has tried due to severe leg weakness and achiness. This progressed to the point that she was afraid she would fall. She is also concerned about losing her hair and wonders if this is related to the beta blocker.     Allergies  Allergen Reactions  . Nubain [Nalbuphine Hcl] Anaphylaxis  . Statins     Leg weakness  . Iodine Rash    Current Outpatient Prescriptions  Medication Sig Dispense Refill  . amiodarone (PACERONE) 200 MG tablet Take 1 tablet (200 mg total) by mouth daily.  30 tablet  2  . calcium-vitamin D (OSCAL WITH D) 500-200 MG-UNIT per tablet Take  1 tablet by mouth daily.        . clopidogrel (PLAVIX) 75 MG tablet TAKE 1 TABLET BY MOUTH EVERY DAY  90 tablet  0  . cycloSPORINE (RESTASIS) 0.05 % ophthalmic emulsion Place 1 drop into both eyes 2 (two) times daily.      . ferrous sulfate 325 (65 FE) MG tablet Take 325 mg by mouth daily with breakfast.         . furosemide (LASIX) 20 MG tablet Take 10 mg by mouth daily.       [name] levothyroxine (SYNTHROID, LEVOTHROID) 125 MCG tablet Take 1 tablet (125 mcg total) by mouth daily.  30 tablet  6  . metoprolol succinate (TOPROL-XL) 50 MG 24 hr tablet Take 1/2 tab daily. Take with or immediately following a meal.  15 tablet  9  . potassium chloride SA (K-DUR,KLOR-CON) 20 MEQ tablet Take 10 mEq by mouth daily.      . ranitidine (ZANTAC) 300 MG tablet Take 300 mg by mouth at bedtime.        . vitamin B-12 (CYANOCOBALAMIN) 500 MCG tablet Take 500 mcg by mouth daily.      . rosuvastatin (CRESTOR) 5 MG tablet Take 1 tablet (5 mg total) by mouth every 7 (seven) days.  7 tablet  0   No current facility-administered medications for this visit.    Past Medical History  Diagnosis Date  . GI bleed 2007  . Hypertension   . TIA (transient ischemic attack)   . Hypothyroidism   . Abdominal aortic aneurysm   . CAD (coronary artery disease)   . SSS (sick sinus syndrome) 10/05/2007    Medtronic Adapta  . Myocardial infarction   . Atrial fibrillation   . COPD (chronic obstructive pulmonary disease)   . Asthma     hx  . Anemia     hx  . Blood transfusion   . UTI (lower urinary tract infection)     hx  . Renal failure, chronic   . H/O hiatal hernia   . GERD (gastroesophageal reflux disease)     Past Surgical History  Procedure Laterality Date  . Pacemaker insertion  10/05/2007    Medtronic Adapta  . Coronary stent placement      x9  . Cardiac catheterization    . Fracture surgery      femur fx, /w ORIF into HIP- 2008  . Total hip arthroplasty      planned for 08/27/2011 Left  . Total hip arthroplasty  08/27/2011    Procedure: TOTAL HIP ARTHROPLASTY;  Surgeon: Kerin Salen, MD;  Location: [place];  Service: Orthopedics;  Laterality: Right;  . US echocardiography  08/23/2011    EF 50%,LA mod. dilated,mild to mod. mitral annular ca+,mod. MR,mild to mod TR,mild to mod. PH,trace AI.  [name] Nm myocar perf wall  motion  10/08/2010    mild apical anterior ischemia    Family History  Problem Relation Age of Onset  . Emphysema Father   . Heart disease Mother   . Cancer Brother     History   Social History  . Marital Status: Widowed    Spouse Name: N/A    Number of Children: 4  . Years of Education: N/A   Occupational History  . Retired    Social History Main Topics  . Smoking status: Former Smoker    Types: Cigarettes  . Smokeless tobacco: Never Used     Comment: quit 40 yrs  . Alcohol Use: No  .  Drug Use: No  . Sexual Activity: Yes    Birth Control/ Protection: Post-menopausal   Other Topics Concern  . Not on file   Social History Narrative  . No narrative on file    Review of systems: The patient specifically denies any chest pain at rest or with exertion, dyspnea at rest or with light exertion, orthopnea, paroxysmal nocturnal dyspnea, syncope, palpitations, focal neurological deficits, intermittent claudication,unexplained weight gain, cough, hemoptysis or wheezing.  The patient also denies abdominal pain, nausea, vomiting, dysphagia, diarrhea, constipation, polyuria, polydipsia, dysuria, hematuria, frequency, urgency, abnormal bleeding or bruising, fever, chills, unexpected weight changes, mood swings, change in skin or hair texture, change in voice quality, auditory or visual problems, allergic reactions or rashes, new musculoskeletal complaints other than usual "aches and pains".   PHYSICAL EXAM BP 116/60  Pulse 95  Ht 5' 2.5" (1.588 m)  Wt 54.023 kg (119 lb 1.6 oz)  BMI 21.42 kg/m2  General: Alert, oriented x3, no distress.  Head: no evidence of trauma, PERRL, EOMI, no exophtalmos or lid lag, no myxedema, no xanthelasma; normal ears, nose and oropharynx Neck: normal jugular venous pulsations and no hepatojugular reflux; brisk carotid pulses without delay and bilateral carotid bruits Chest: clear to auscultation, no signs of consolidation by percussion or palpation,  normal fremitus, symmetrical and full respiratory excursions; healthy subclavian pacemaker site Cardiovascular: normal position and quality of the apical impulse, regular rhythm, normal first and second heart sounds, no rubs or, peaking 2/6 aortic ejection murmur with limited radiation gallops Abdomen: no tenderness or distention, no masses by palpation, no abnormal pulsatility or arterial bruits, normal bowel sounds, no hepatosplenomegaly Extremities: Varicose veins bilaterally ; no clubbing, cyanosis or edema; chronic brawny changes are seen; 2+ radial, ulnar and brachial pulses bilaterally; 2+ right femoral, posterior tibial and dorsalis pedis pulses; 2+ left femoral, posterior tibial and dorsalis pedis pulses; no subclavian or femoral bruits Neurological: grossly nonfocal. Very hard of hearing   EKG: Showed paced, ventricular sensed. Note an AV delay of about 350 ms (MVP on). Minor intraventricular conduction delay with a QRS that is just under 120 ms in duration, most closely resembling an atypical left bundle branch block,  no ischemic appearing repolarization abnormalities  Lipid Panel     Component Value Date/Time   CHOL 182 12/01/2012 1035   TRIG 118 12/01/2012 1035   HDL 56 12/01/2012 1035   CHOLHDL 3.3 12/01/2012 1035   VLDL 24 12/01/2012 1035   LDLCALC 102* 12/01/2012 1035    BMET    Component Value Date/Time   NA 141 12/01/2012 1035   K 4.1 12/01/2012 1035   CL 101 12/01/2012 1035   CO2 36* 12/01/2012 1035   GLUCOSE 73 12/01/2012 1035   BUN 23 12/01/2012 1035   CREATININE 1.50* 12/01/2012 1035   CREATININE 1.57* 08/30/2011 0350   CALCIUM 9.3 12/01/2012 1035   GFRNONAA 27* 08/30/2011 0350   GFRAA 32* 08/30/2011 0350     ASSESSMENT AND PLAN CAD (coronary artery disease) Complicated history of multiple percutaneous revascularization procedures and residual stenosis of the left main coronary artery. Thankfully right now she is asymptomatic and her most recent functional study shows low  risk findings. I would be hard pressed to ever recommend further revascularization procedures in this nonagenarian. Focus on symptom control and risk factor treatment.  Hyperlipidemia, statin intolerance The extent of her coronary and peripheral vascular problems really imposes need for lipid-lowering therapy, but unfortunately she has been intolerant of multiple agents. We'll try once weekly  rosuvastatin. If she tolerates this we'll recheck a lipid profile in 3 months. Target LDL cholesterol is less than 70 mg/dL, but I would be happy if we could achieve an LDL of less than 100. If intolerant, consider PSCK-9 inhibitors when they become available  Paroxysmal atrial fibrillation Amiodarone seems to be doing a good job of controlling her arrhythmia. No significant atrial fibrillation since the 5 hour episode that occurred in May of 2014. Her risk of embolic events is high, as is her risk of bleeding complications. This has previously been thought out and discussed with the patient and her family. She is on clopidogrel and is not felt to be a good candidate for warfarin or a novel anticoagulant. Check. Liver function tests, thyroid function tests and monitor for pulmonary complications in the newborn period  Pacemaker - dual chamber Medtronic 2009 Pacemaker check in clinic. Normal device function. Thresholds, sensing, impedances consistent with previous measurements. Device programmed to maximize longevity. Very low burden of atrial arrhythmia now (<0.1%)---8 AHR episodes---max dur. 1 hr 27 mins, Max A 293, Max V 145---AT/AFL+Plavix (APP+PMOP). No high ventricular rates noted. Device programmed at appropriate safety margins. Histogram distribution appropriate for patient activity level. Device programmed to optimize intrinsic conduction. Estimated longevity 2.5 years. Patient will follow up via [facility] on 07-30-2013 and with [facility] in 6 months.   AAA (abdominal aortic aneurysm) Asymptomatic, stable in size  at about 3.6 cm  Bilateral carotid artery stenosis Mild. Asymptomatic.   Patient Instructions  START Crestor 5mg  take one tablet once a week.  Remote monitoring is used to monitor your Pacemaker of ICD from home. This monitoring reduces the number of office visits required to check your device to one time per year. It allows [place] to keep an eye on the functioning of your device to ensure it is working properly. You are scheduled for a device check from home on 07/30/13. You may send your transmission at any time that day. If you have a wireless device, the transmission will be sent automatically. After your physician reviews your transmission, you will receive a postcard with your next transmission date.  Your physician recommends that you schedule a follow-up appointment in: 6 months with Dr. Sallyanne Kuster.       Orders Placed This Encounter  Procedures  . Implantable device check  . EKG 12-Lead   Meds ordered this encounter  Medications  . rosuvastatin (CRESTOR) 5 MG tablet    Sig: Take 1 tablet (5 mg total) by mouth every 7 (seven) days.    Dispense:  7 tablet    Refill:  0    Calvin Chura  Sanda Klein, MD, [facility] [phone] office [phone] pager

## 2013-04-30 NOTE — Assessment & Plan Note (Signed)
Mild. Asymptomatic.

## 2013-04-30 NOTE — Assessment & Plan Note (Signed)
The extent of her coronary and peripheral vascular problems really imposes need for lipid-lowering therapy, but unfortunately she has been intolerant of multiple agents. We'll try once weekly rosuvastatin. If she tolerates this we'll recheck a lipid profile in 3 months. Target LDL cholesterol is less than 70 mg/dL, but I would be happy if we could achieve an LDL of less than 100. If intolerant, consider PSCK-9 inhibitors when they become available

## 2013-04-30 NOTE — Assessment & Plan Note (Signed)
Asymptomatic, stable in size at about 3.6 cm

## 2013-06-18 ENCOUNTER — Other Ambulatory Visit: Payer: Self-pay | Admitting: *Deleted

## 2013-06-18 MED ORDER — CLOPIDOGREL BISULFATE 75 MG PO TABS: 75.0000 mg | ORAL_TABLET | Freq: Once | ORAL | Status: DC

## 2013-06-18 NOTE — Telephone Encounter (Signed)
RX refill sent to patients pharmacy

## 2013-06-19 ENCOUNTER — Other Ambulatory Visit: Payer: Self-pay | Admitting: *Deleted

## 2013-06-19 MED ORDER — AMIODARONE HCL 200 MG PO TABS: 200.0000 mg | ORAL_TABLET | Freq: Every day | ORAL | Status: DC

## 2013-06-19 NOTE — Telephone Encounter (Signed)
Rx refill sent to patient pharmacy   

## 2013-07-08 ENCOUNTER — Emergency Department (HOSPITAL_COMMUNITY)
Admission: EM | Admit: 2013-07-08 | Discharge: 2013-07-08 | Disposition: A | Discharge status: Home / Self Care | Payer: Medicare Other | Attending: Emergency Medicine | Admitting: Emergency Medicine

## 2013-07-08 ENCOUNTER — Encounter (HOSPITAL_COMMUNITY): Payer: Self-pay | Admitting: Emergency Medicine

## 2013-07-08 DIAGNOSIS — J4489 Other specified chronic obstructive pulmonary disease: Secondary | ICD-10-CM | POA: Insufficient documentation

## 2013-07-08 DIAGNOSIS — Z7902 Long term (current) use of antithrombotics/antiplatelets: Secondary | ICD-10-CM | POA: Diagnosis not present

## 2013-07-08 DIAGNOSIS — J449 Chronic obstructive pulmonary disease, unspecified: Secondary | ICD-10-CM | POA: Insufficient documentation

## 2013-07-08 DIAGNOSIS — I129 Hypertensive chronic kidney disease with stage 1 through stage 4 chronic kidney disease, or unspecified chronic kidney disease: Secondary | ICD-10-CM | POA: Diagnosis not present

## 2013-07-08 DIAGNOSIS — K219 Gastro-esophageal reflux disease without esophagitis: Secondary | ICD-10-CM | POA: Insufficient documentation

## 2013-07-08 DIAGNOSIS — Z79899 Other long term (current) drug therapy: Secondary | ICD-10-CM | POA: Diagnosis not present

## 2013-07-08 DIAGNOSIS — Z9861 Coronary angioplasty status: Secondary | ICD-10-CM | POA: Insufficient documentation

## 2013-07-08 DIAGNOSIS — Z8673 Personal history of transient ischemic attack (TIA), and cerebral infarction without residual deficits: Secondary | ICD-10-CM | POA: Insufficient documentation

## 2013-07-08 DIAGNOSIS — I4891 Unspecified atrial fibrillation: Secondary | ICD-10-CM | POA: Diagnosis not present

## 2013-07-08 DIAGNOSIS — N189 Chronic kidney disease, unspecified: Secondary | ICD-10-CM | POA: Insufficient documentation

## 2013-07-08 DIAGNOSIS — D649 Anemia, unspecified: Secondary | ICD-10-CM | POA: Diagnosis not present

## 2013-07-08 DIAGNOSIS — Z9889 Other specified postprocedural states: Secondary | ICD-10-CM | POA: Insufficient documentation

## 2013-07-08 DIAGNOSIS — I252 Old myocardial infarction: Secondary | ICD-10-CM | POA: Diagnosis not present

## 2013-07-08 DIAGNOSIS — K644 Residual hemorrhoidal skin tags: Secondary | ICD-10-CM | POA: Insufficient documentation

## 2013-07-08 DIAGNOSIS — Z87440 Personal history of urinary (tract) infections: Secondary | ICD-10-CM | POA: Diagnosis not present

## 2013-07-08 DIAGNOSIS — E039 Hypothyroidism, unspecified: Secondary | ICD-10-CM | POA: Diagnosis not present

## 2013-07-08 DIAGNOSIS — K649 Unspecified hemorrhoids: Secondary | ICD-10-CM

## 2013-07-08 DIAGNOSIS — I2510 Atherosclerotic heart disease of native coronary artery without angina pectoris: Secondary | ICD-10-CM | POA: Insufficient documentation

## 2013-07-08 DIAGNOSIS — Z87891 Personal history of nicotine dependence: Secondary | ICD-10-CM | POA: Diagnosis not present

## 2013-07-08 DIAGNOSIS — I10 Essential (primary) hypertension: Secondary | ICD-10-CM | POA: Diagnosis not present

## 2013-07-08 LAB — COMPREHENSIVE METABOLIC PANEL
ALBUMIN: 3.7 g/dL (ref 3.5–5.2)
ALT: 12 U/L (ref 0–35)
AST: 24 U/L (ref 0–37)
Alkaline Phosphatase: 85 U/L (ref 39–117)
BUN: 28 mg/dL — ABNORMAL HIGH (ref 6–23)
CALCIUM: 9.6 mg/dL (ref 8.4–10.5)
CHLORIDE: 99 meq/L (ref 96–112)
CO2: 30 mEq/L (ref 19–32)
CREATININE: 1.57 mg/dL — AB (ref 0.50–1.10)
GFR calc Af Amer: 31 mL/min — ABNORMAL LOW (ref >90)
GFR calc non Af Amer: 27 mL/min — ABNORMAL LOW (ref >90)
Glucose, Bld: 88 mg/dL (ref 70–99)
Potassium: 4.1 mEq/L (ref 3.7–5.3)
Sodium: 143 mEq/L (ref 137–147)
Total Bilirubin: 0.5 mg/dL (ref 0.3–1.2)
Total Protein: 7.3 g/dL (ref 6.0–8.3)

## 2013-07-08 LAB — CBC WITH DIFFERENTIAL/PLATELET
BASOS ABS: 0 10*3/uL (ref 0.0–0.1)
BASOS PCT: 0 % (ref 0–1)
EOS PCT: 3 % (ref 0–5)
Eosinophils Absolute: 0.2 10*3/uL (ref 0.0–0.7)
HEMATOCRIT: 41.8 % (ref 36.0–46.0)
Hemoglobin: 13.3 g/dL (ref 12.0–15.0)
Lymphocytes Relative: 25 % (ref 12–46)
Lymphs Abs: 1.6 10*3/uL (ref 0.7–4.0)
MCH: 32.4 pg (ref 26.0–34.0)
MCHC: 31.8 g/dL (ref 30.0–36.0)
MCV: 102 fL — ABNORMAL HIGH (ref 78.0–100.0)
MONO ABS: 0.8 10*3/uL (ref 0.1–1.0)
Monocytes Relative: 13 % — ABNORMAL HIGH (ref 3–12)
NEUTROS ABS: 3.6 10*3/uL (ref 1.7–7.7)
Neutrophils Relative %: 59 % (ref 43–77)
Platelets: 129 10*3/uL — ABNORMAL LOW (ref 150–400)
RBC: 4.1 MIL/uL (ref 3.87–5.11)
RDW: 13.3 % (ref 11.5–15.5)
WBC: 6.2 10*3/uL (ref 4.0–10.5)

## 2013-07-08 MED ORDER — HYDROCORTISONE 2.5 % RE CREA: TOPICAL_CREAM | RECTAL | Status: DC

## 2013-07-08 NOTE — Discharge Instructions (Signed)
Hydrocortisone cream: Apply locally 2 times daily alternated with preparation H twice daily.   Metamucil: 1 heaping teaspoon in a glass of water twice daily.  If hemorrhoids persist, followup with general surgery to discuss excision. Return to the emergency department if your bleeding substantially worsens or you develop lightheadedness, shortness of breath, or chest pain.   Hemorrhoids Hemorrhoids are swollen veins around the rectum or anus. There are two types of hemorrhoids:   Internal hemorrhoids. These occur in the veins just inside the rectum. They may poke through to the outside and become irritated and painful.  External hemorrhoids. These occur in the veins outside the anus and can be felt as a painful swelling or hard lump near the anus. CAUSES  Pregnancy.   Obesity.   Constipation or diarrhea.   Straining to have a bowel movement.   Sitting for long periods on the toilet.  Heavy lifting or other activity that caused you to strain.  Anal intercourse. SYMPTOMS   Pain.   Anal itching or irritation.   Rectal bleeding.   Fecal leakage.   Anal swelling.   One or more lumps around the anus.  DIAGNOSIS  Your caregiver may be able to diagnose hemorrhoids by visual examination. Other examinations or tests that may be performed include:   Examination of the rectal area with a gloved hand (digital rectal exam).   Examination of anal canal using a small tube (scope).   A blood test if you have lost a significant amount of blood.  A test to look inside the colon (sigmoidoscopy or colonoscopy). TREATMENT Most hemorrhoids can be treated at home. However, if symptoms do not seem to be getting better or if you have a lot of rectal bleeding, your caregiver may perform a procedure to help make the hemorrhoids get smaller or remove them completely. Possible treatments include:   Placing a rubber band at the base of the hemorrhoid to cut off the circulation  (rubber band ligation).   Injecting a chemical to shrink the hemorrhoid (sclerotherapy).   Using a tool to burn the hemorrhoid (infrared light therapy).   Surgically removing the hemorrhoid (hemorrhoidectomy).   Stapling the hemorrhoid to block blood flow to the tissue (hemorrhoid stapling).  HOME CARE INSTRUCTIONS   Eat foods with fiber, such as whole grains, beans, nuts, fruits, and vegetables. Ask your doctor about taking products with added fiber in them (fibersupplements).  Increase fluid intake. Drink enough water and fluids to keep your urine clear or pale yellow.   Exercise regularly.   Go to the bathroom when you have the urge to have a bowel movement. Do not wait.   Avoid straining to have bowel movements.   Keep the anal area dry and clean. Use wet toilet paper or moist towelettes after a bowel movement.   Medicated creams and suppositories may be used or applied as directed.   Only take over-the-counter or prescription medicines as directed by your caregiver.   Take warm sitz baths for 15 20 minutes, 3 4 times a day to ease pain and discomfort.   Place ice packs on the hemorrhoids if they are tender and swollen. Using ice packs between sitz baths may be helpful.   Put ice in a plastic bag.   Place a towel between your skin and the bag.   Leave the ice on for 15 20 minutes, 3 4 times a day.   Do not use a donut-shaped pillow or sit on the toilet for long periods.  This increases blood pooling and pain.  SEEK MEDICAL CARE IF:  You have increasing pain and swelling that is not controlled by treatment or medicine.  You have uncontrolled bleeding.  You have difficulty or you are unable to have a bowel movement.  You have pain or inflammation outside the area of the hemorrhoids. MAKE SURE YOU:  Understand these instructions.  Will watch your condition.  Will get help right away if you are not doing well or get worse. Document Released:  02/27/2000 Document Revised: 02/16/2012 Document Reviewed: 01/04/2012 [facility] Patient Information 2014 [place].

## 2013-07-08 NOTE — ED Provider Notes (Signed)
CSN: [id number]     Arrival date & time 07/08/13  0941 History   First MD Initiated Contact with Patient 07/08/13 0945     Chief Complaint  Patient presents with  . Rectal Bleeding     (Consider location/radiation/quality/duration/timing/severity/associated sxs/prior Treatment) HPI Comments: Patient is a 80 year old female with history of coronary artery disease, A. fib, abdominal aneurysm. Presents today with complaints of rectal bleeding that has been occurring intermittently for the past month, and worsening for the past several days. According to the daughter she has been "filling up the toilet with blood". Patient denies to me she is having any rectal pain, fever, abdominal pain. Per the daughter, the patient feels weak and has no energy. She takes Plavix.  Patient is a 80 y.o. female presenting with hematochezia. The history is provided by the patient.  Rectal Bleeding Quality:  Bright red Amount:  Moderate Duration:  1 month Timing:  Intermittent Progression:  Worsening Chronicity:  New Context: hemorrhoids   Context: not rectal pain   Relieved by:  Nothing   Past Medical History  Diagnosis Date  . GI bleed 2007  . Hypertension   . TIA (transient ischemic attack)   . Hypothyroidism   . Abdominal aortic aneurysm   . CAD (coronary artery disease)   . SSS (sick sinus syndrome) 10/05/2007    Medtronic Adapta  . Myocardial infarction   . Atrial fibrillation   . COPD (chronic obstructive pulmonary disease)   . Asthma     hx  . Anemia     hx  . Blood transfusion   . UTI (lower urinary tract infection)     hx  . Renal failure, chronic   . H/O hiatal hernia   . GERD (gastroesophageal reflux disease)    Past Surgical History  Procedure Laterality Date  . Pacemaker insertion  10/05/2007    Medtronic Adapta  . Coronary stent placement      x9  . Cardiac catheterization    . Fracture surgery      femur fx, /w ORIF into HIP- 2008  . Total hip arthroplasty     planned for 08/27/2011 Left  . Total hip arthroplasty  08/27/2011    Procedure: TOTAL HIP ARTHROPLASTY;  Surgeon: Kerin Salen, MD;  Location: [place];  Service: Orthopedics;  Laterality: Right;  . US echocardiography  08/23/2011    EF 50%,LA mod. dilated,mild to mod. mitral annular ca+,mod. MR,mild to mod TR,mild to mod. PH,trace AI.  [name] Nm myocar perf wall motion  10/08/2010    mild apical anterior ischemia   Family History  Problem Relation Age of Onset  . Emphysema Father   . Heart disease Mother   . Cancer Brother    History  Substance Use Topics  . Smoking status: Former Smoker    Types: Cigarettes  . Smokeless tobacco: Never Used     Comment: quit 40 yrs  . Alcohol Use: No   OB History   Grav Para Term Preterm Abortions TAB SAB Ect Mult Living                 Review of Systems  Gastrointestinal: Positive for hematochezia.  All other systems reviewed and are negative.     Allergies  Nubain; Statins; and Iodine  Home Medications   Prior to Admission medications   Medication Sig Start Date End Date Taking? Authorizing Provider  amiodarone (PACERONE) 200 MG tablet Take 1 tablet (200 mg total) by mouth daily. 06/19/13  Mihai Croitoru, MD  calcium-vitamin D (OSCAL WITH D) 500-200 MG-UNIT per tablet Take 1 tablet by mouth daily.      Historical Provider, MD  clopidogrel (PLAVIX) 75 MG tablet Take 1 tablet (75 mg total) by mouth once. 06/18/13   Mihai Croitoru, MD  cycloSPORINE (RESTASIS) 0.05 % ophthalmic emulsion Place 1 drop into both eyes 2 (two) times daily.    Historical Provider, MD  ferrous sulfate 325 (65 FE) MG tablet Take 325 mg by mouth daily with breakfast.      Historical Provider, MD  furosemide (LASIX) 20 MG tablet Take 10 mg by mouth daily.     Historical Provider, MD  levothyroxine (SYNTHROID, LEVOTHROID) 125 MCG tablet Take 1 tablet (125 mcg total) by mouth daily. 04/02/13   Lorretta Harp, MD  metoprolol succinate (TOPROL-XL) 50 MG 24 hr tablet Take 1/2 tab  daily. Take with or immediately following a meal. 11/01/12   Rebecca Eaton, MD  potassium chloride SA (K-DUR,KLOR-CON) 20 MEQ tablet Take 10 mEq by mouth daily.    Historical Provider, MD  ranitidine (ZANTAC) 300 MG tablet Take 300 mg by mouth at bedtime.      Historical Provider, MD  rosuvastatin (CRESTOR) 5 MG tablet Take 1 tablet (5 mg total) by mouth every 7 (seven) days. 04/25/13   Mihai Croitoru, MD  vitamin B-12 (CYANOCOBALAMIN) 500 MCG tablet Take 500 mcg by mouth daily.    Historical Provider, MD   BP 151/67  Pulse 73  Temp(Src) 98.1 F (36.7 C) (Oral)  Resp 16  SpO2 97% Physical Exam  Nursing note and vitals reviewed. Constitutional: She is oriented to person, place, and time. She appears well-developed and well-nourished. No distress.  HENT:  Head: Normocephalic and atraumatic.  Neck: Normal range of motion. Neck supple.  Cardiovascular: Normal rate, regular rhythm and normal heart sounds.   No murmur heard. Pulmonary/Chest: Effort normal and breath sounds normal. No respiratory distress. She has no wheezes.  Abdominal: Soft. Bowel sounds are normal. She exhibits no distension. There is no tenderness.  Genitourinary:  There are multiple circumferential hemorrhoids around the rectum. Several of these appear inflamed, but none are actively bleeding at this time.  Musculoskeletal: Normal range of motion. She exhibits no edema.  Neurological: She is alert and oriented to person, place, and time.  Skin: Skin is warm and dry. She is not diaphoretic.    ED Course  Procedures (including critical care time) Labs Review Labs Reviewed - No data to display  Imaging Review No results found.   EKG Interpretation None      MDM   Final diagnoses:  None    Patient is a 80 year old female who presents with complaints of bleeding from external hemorrhoids. Her hemoglobin is stable and she is not actively bled while in the emergency department. She has no elevation of white  count and abdomen is benign. I've discussed this with Dr. Grandville Silos from general surgery who is in agreement with my disposition and treatment. She will be given hydrocortisone and preparation H. along with stool softeners and followup with general surgery if not improving in the next several days. She understands to return if her symptoms worsen or change.    [name], MD 07/08/13 1155

## 2013-07-08 NOTE — ED Notes (Signed)
Pt c/o rectal bleeding that has been going on for the past month off and on but since yesterday worse. Pt daughter at bedside stated that blood filled the toilet and was in the floor. Pt denies any pain and stated that she thinks it her hemorrhoids. Blood is bright red in color.

## 2013-07-09 ENCOUNTER — Telehealth (INDEPENDENT_AMBULATORY_CARE_PROVIDER_SITE_OTHER): Payer: Self-pay

## 2013-07-09 ENCOUNTER — Other Ambulatory Visit: Payer: Self-pay

## 2013-07-09 MED ORDER — CLOPIDOGREL BISULFATE 75 MG PO TABS: 75.0000 mg | ORAL_TABLET | Freq: Every day | ORAL | Status: DC

## 2013-07-09 NOTE — Telephone Encounter (Signed)
Rx was sent to pharmacy electronically. 

## 2013-07-09 NOTE — Telephone Encounter (Signed)
Patient was seen in the ED.07-08-13 for hems , was advised to call for appt. Appt with Dr. Marcello Moores 07-16-13 @ 9:40am. Advised to do sitz bath , apply cream as ordered . Sister verbalized understanding

## 2013-07-16 ENCOUNTER — Ambulatory Visit (INDEPENDENT_AMBULATORY_CARE_PROVIDER_SITE_OTHER): Payer: Medicare Other | Admitting: General Surgery

## 2013-07-16 ENCOUNTER — Encounter (INDEPENDENT_AMBULATORY_CARE_PROVIDER_SITE_OTHER): Payer: Self-pay | Admitting: General Surgery

## 2013-07-16 VITALS — BP 152/74 | HR 74 | Temp 97.4°F | Resp 16 | Ht 63.0 in | Wt 118.8 lb

## 2013-07-16 DIAGNOSIS — K625 Hemorrhage of anus and rectum: Secondary | ICD-10-CM | POA: Diagnosis not present

## 2013-07-16 MED ORDER — HYDROCORTISONE ACETATE 25 MG RE SUPP: 25.0000 mg | Freq: Every day | RECTAL | Status: DC

## 2013-07-16 NOTE — Progress Notes (Signed)
Chief Complaint  Patient presents with  . New Evaluation    ED HEMS    HISTORY: [name] is a 80 y.o. female who presents to the office with rectal bleeding.  Other symptoms include nothing.  This had been occurring for the past week.  She has tried cortisone suppositories in the past with some success.  Nothing makes the symptoms worse.   It is intermittent in nature.  Her bowel habits are regular and her bowel movements are usually hard, but she does have to strain occasionally.  Her fiber intake is dietary, and she has started Benefiber recently.  Her last colonoscopy was 2013 per pt.       Past Medical History  Diagnosis Date  . GI bleed 2007  . Hypertension   . TIA (transient ischemic attack)   . Hypothyroidism   . Abdominal aortic aneurysm   . CAD (coronary artery disease)   . SSS (sick sinus syndrome) 10/05/2007    Medtronic Adapta  . Myocardial infarction   . Atrial fibrillation   . COPD (chronic obstructive pulmonary disease)   . Asthma     hx  . Anemia     hx  . Blood transfusion   . UTI (lower urinary tract infection)     hx  . Renal failure, chronic   . H/O hiatal hernia   . GERD (gastroesophageal reflux disease)   . Stroke   . Osteoporosis   . Blood transfusion without reported diagnosis       Past Surgical History  Procedure Laterality Date  . Pacemaker insertion  10/05/2007    Medtronic Adapta  . Coronary stent placement      x9  . Cardiac catheterization    . Fracture surgery      femur fx, /w ORIF into HIP- 2008  . Total hip arthroplasty      planned for 08/27/2011 Left  . Total hip arthroplasty  08/27/2011    Procedure: TOTAL HIP ARTHROPLASTY;  Surgeon: Kerin Salen, MD;  Location: [facility];  Service: Orthopedics;  Laterality: Right;  . US echocardiography  08/23/2011    EF 50%,LA mod. dilated,mild to mod. mitral annular ca+,mod. MR,mild to mod TR,mild to mod. PH,trace AI.  [name] Nm myocar perf wall motion  10/08/2010    mild apical anterior ischemia         Current Outpatient Prescriptions  Medication Sig Dispense Refill  . amiodarone (PACERONE) 200 MG tablet Take 1 tablet (200 mg total) by mouth daily.  30 tablet  6  . Biotin 1000 MCG tablet Take 1,000 mcg by mouth daily.      . Calcium Carb-Cholecalciferol (CALCIUM 600 + D PO) Take 1 tablet by mouth daily.      . clopidogrel (PLAVIX) 75 MG tablet Take 1 tablet (75 mg total) by mouth daily with breakfast.  90 tablet  10  . cycloSPORINE (RESTASIS) 0.05 % ophthalmic emulsion Place 1 drop into both eyes 2 (two) times daily.      . ferrous sulfate 325 (65 FE) MG tablet Take 325 mg by mouth daily with breakfast.       . furosemide (LASIX) 20 MG tablet Take 20 mg by mouth daily.       . hydrocortisone (ANUSOL-HC) 2.5 % rectal cream Apply rectally 2 times daily  30 g  1  . levothyroxine (SYNTHROID, LEVOTHROID) 125 MCG tablet Take 1 tablet (125 mcg total) by mouth daily.  30 tablet  6  . metoprolol succinate (  TOPROL-XL) 25 MG 24 hr tablet Take 12.5 mg by mouth daily.      . potassium chloride SA (K-DUR,KLOR-CON) 20 MEQ tablet Take 20 mEq by mouth daily.       . ranitidine (ZANTAC) 300 MG tablet Take 300 mg by mouth at bedtime.       . vitamin B-12 (CYANOCOBALAMIN) 1000 MCG tablet Take 1,000 mcg by mouth daily.       No current facility-administered medications for this visit.      Allergies  Allergen Reactions  . Nubain [Nalbuphine Hcl] Anaphylaxis  . Statins     Leg weakness  . Iodine Rash      Family History  Problem Relation Age of Onset  . Emphysema Father   . Heart disease Mother   . Cancer Brother     History   Social History  . Marital Status: Widowed    Spouse Name: N/A    Number of Children: 4  . Years of Education: N/A   Occupational History  . Retired    Social History Main Topics  . Smoking status: Former Smoker    Types: Cigarettes    Quit date: 07/16/2013  . Smokeless tobacco: Never Used     Comment: quit 40 yrs  . Alcohol Use: No  . Drug Use: No  .  Sexual Activity: Yes    Birth Control/ Protection: Post-menopausal   Other Topics Concern  . None   Social History Narrative  . None      REVIEW OF SYSTEMS - PERTINENT POSITIVES ONLY: Review of Systems - General ROS: negative for - chills, fever or weight loss Hematological and Lymphatic ROS: negative for - bleeding problems, blood clots or bruising Respiratory ROS: no cough, shortness of breath, or wheezing Cardiovascular ROS: no chest pain or dyspnea on exertion Gastrointestinal ROS: no abdominal pain, change in bowel habits, or black or bloody stools Genito-Urinary ROS: no dysuria, trouble voiding, or hematuria  EXAM: Filed Vitals:   07/16/13 0927  BP: 152/74  Pulse: 74  Temp: 97.4 F (36.3 C)  Resp: 16    General appearance: alert and cooperative Resp: clear to auscultation bilaterally Cardio: regular rate and rhythm GI: normal findings: soft, non-tender   Procedure: Anoscopy Surgeon: Marcello Moores Diagnosis: rectal bleeding  Assistant: Hedricks After the risks and benefits were explained, verbal consent was obtained for above procedure  Anesthesia: none Findings: grade 3 internal hemorrhoids    ASSESSMENT AND PLAN: [name] Is a 80 year old woman on Plavix who had an episode of rectal bleeding over the last few days. This has slowed down with Anusol cream and Preparation H suppositories. He denies any bleeding for the past couple days. On exam she has some grade 3 internal hemorrhoids with signs of inflammation due to prolapse. I have recommended that she continue the Benefiber and use Anusol suppository at night for the next 2 weeks. She should use the Anusol cream as needed for symptomatic relief. I'll see her back in 2 months to see how her symptoms are progressing. If she continues to have bleeding, she may be a candidate for an office building procedure.    Rosario Adie, MD Colon and Rectal Surgery / [facility]      Visit Diagnoses: 1. Rectal bleeding     Primary Care Physician: Florina Ou, MD

## 2013-07-16 NOTE — Patient Instructions (Signed)
Continue using the Anusol cream every night before bed for the next 2 weeks. Used the preparation H. Suppositories if you notice any bleeding to help slow this down and stop it. Continue Benefiber daily. Return to the office in 2 months for reevaluation.   HEMORRHOIDS    Did you know... Hemorrhoids are one of the most common ailments known.  More than half the population will develop hemorrhoids, usually after age 78.  Millions of Americans currently suffer from hemorrhoids.  The average person suffers in silence for a long period before seeking medical care.  Today's treatment methods make some types of hemorrhoid removal much less painful.  What are hemorrhoids? Often described as "varicose veins of the anus and rectum", hemorrhoids are enlarged, bulging blood vessels in and about the anus and lower rectum. There are two types of hemorrhoids: external and internal, which refer to their location.  External (outside) hemorrhoids develop near the anus and are covered by very sensitive skin. These are usually painless. However, if a blood clot (thrombosis) develops in an external hemorrhoid, it becomes a painful, hard lump. The external hemorrhoid may bleed if it ruptures. Internal (inside) hemorrhoids develop within the anus beneath the lining. Painless bleeding and protrusion during bowel movements are the most common symptom. However, an internal hemorrhoid can cause severe pain if it is completely "prolapsed" - protrudes from the anal opening and cannot be pushed back inside.   What causes hemorrhoids? An exact cause is unknown; however, the upright posture of humans alone forces a great deal of pressure on the rectal veins, which sometimes causes them to bulge. Other contributing factors include:  . Aging  . Chronic constipation or diarrhea  . Pregnancy  . Heredity  . Straining during bowel movements  . Faulty bowel function due to overuse of laxatives or enemas . Spending long periods  of time (e.g., reading) on the toilet  Whatever the cause, the tissues supporting the vessels stretch. As a result, the vessels dilate; their walls become thin and bleed. If the stretching and pressure continue, the weakened vessels protrude.  What are the symptoms? If you notice any of the following, you could have hemorrhoids:  . Bleeding during bowel movements  . Protrusion during bowel movements . Itching in the anal area  . Pain  . Sensitive lump(s)  How are hemorrhoids treated? Mild symptoms can be relieved frequently by increasing the amount of fiber (e.g., fruits, vegetables, breads and cereals) and fluids in the diet. Eliminating excessive straining reduces the pressure on hemorrhoids and helps prevent them from protruding. A sitz bath - sitting in plain warm water for about 10 minutes - can also provide some relief . With these measures, the pain and swelling of most symptomatic hemorrhoids will decrease in two to seven days, and the firm lump should recede within four to six weeks. In cases of severe or persistent pain from a thrombosed hemorrhoid, your physician may elect to remove the hemorrhoid containing the clot with a small incision. Performed under local anesthesia as an outpatient, this procedure generally provides relief. Severe hemorrhoids may require special treatment, much of which can be performed on an outpatient basis.  . Ligation - the rubber band treatment - works effectively on internal hemorrhoids that protrude with bowel movements. A small rubber band is placed over the hemorrhoid, cutting off its blood supply. The hemorrhoid and the band fall off in a few days and the wound usually heals in a week or two. This procedure  sometimes produces mild discomfort and bleeding and may need to be repeated for a full effect.  There is a more intense version of this procedure that is done in the OR as outpatient surgery called THD.  It involves identifying blood vessels leading to  the hemorrhoids and then tying them off with sutures.  This method is a little more painful than rubber band ligation but less painful than traditional hemorrhoidectomy and usually does not have to be repeated.  It is best for internal hemorrhoids that bleed.  Rubber Band Ligation of Internal Hemorrhoids:  A.  Bulging, bleeding, internal hemorrhoid B.  Rubber band applied at the base of the hemorrhoid C.  About 7 days later, the banded hemorrhoid has fallen off leaving a small scar (arrow)  . Injection and Coagulation can also be used on bleeding hemorrhoids that do not protrude. Both methods are relatively painless and cause the hemorrhoid to shrivel up. [name] Hemorrhoidectomy - surgery to remove the hemorrhoids - is the most complete method for removal of internal and external hemorrhoids. It is necessary when (1) clots repeatedly form in external hemorrhoids; (2) ligation fails to treat internal hemorrhoids; (3) the protruding hemorrhoid cannot be reduced; or (4) there is persistent bleeding. A hemorrhoidectomy removes excessive tissue that causes the bleeding and protrusion. It is done under anesthesia using sutures, and may, depending upon circumstances, require hospitalization and a period of inactivity. Laser hemorrhoidectomies do not offer any advantage over standard operative techniques. They are also quite expensive, and contrary to popular belief, are no less painful.  Do hemorrhoids lead to cancer? No. There is no relationship between hemorrhoids and cancer. However, the symptoms of hemorrhoids, particularly bleeding, are similar to those of colorectal cancer and other diseases of the digestive system. Therefore, it is important that all symptoms are investigated by a physician specially trained in treating diseases of the colon and rectum and that everyone 50 years or older undergo screening tests for colorectal cancer. Do not rely on over-the-counter medications or other self-treatments. See a  colorectal surgeon first so your symptoms can be properly evaluated and effective treatment prescribed.  2012 American Society of Colon & Rectal [employer]

## 2013-07-18 ENCOUNTER — Other Ambulatory Visit (HOSPITAL_COMMUNITY): Payer: Self-pay | Admitting: Cardiovascular Disease

## 2013-07-18 DIAGNOSIS — I714 Abdominal aortic aneurysm, without rupture, unspecified: Secondary | ICD-10-CM

## 2013-07-30 ENCOUNTER — Telehealth: Payer: Self-pay | Admitting: Cardiology

## 2013-07-30 ENCOUNTER — Encounter: Payer: Medicare Other | Admitting: *Deleted

## 2013-07-30 NOTE — Telephone Encounter (Signed)
LMOVM reminding pt to send remote transmission.   

## 2013-07-31 ENCOUNTER — Encounter: Payer: Self-pay | Admitting: Cardiology

## 2013-08-02 ENCOUNTER — Ambulatory Visit (HOSPITAL_COMMUNITY)
Admission: RE | Admit: 2013-08-02 | Discharge: 2013-08-02 | Disposition: A | Discharge status: Home / Self Care | Payer: Medicare Other | Source: Ambulatory Visit | Attending: Cardiology | Admitting: Cardiology

## 2013-08-02 DIAGNOSIS — I714 Abdominal aortic aneurysm, without rupture, unspecified: Secondary | ICD-10-CM | POA: Diagnosis not present

## 2013-08-02 NOTE — Progress Notes (Signed)
Abdominal Aortic Duplex Completed °Brianna L Mazza,RVT °

## 2013-08-10 ENCOUNTER — Encounter: Payer: Self-pay | Admitting: Cardiovascular Disease

## 2013-08-10 ENCOUNTER — Ambulatory Visit (INDEPENDENT_AMBULATORY_CARE_PROVIDER_SITE_OTHER): Payer: Medicare Other | Admitting: *Deleted

## 2013-08-10 DIAGNOSIS — I480 Paroxysmal atrial fibrillation: Secondary | ICD-10-CM

## 2013-08-10 DIAGNOSIS — I4891 Unspecified atrial fibrillation: Secondary | ICD-10-CM | POA: Diagnosis not present

## 2013-08-10 LAB — MDC_IDC_ENUM_SESS_TYPE_REMOTE
Battery Remaining Longevity: 26 mo
Brady Statistic AP VP Percent: 2 %
Brady Statistic AS VP Percent: 0 %
Brady Statistic AS VS Percent: 0 %
Date Time Interrogation Session: 20150529182418
Lead Channel Impedance Value: 508 Ohm
Lead Channel Impedance Value: 515 Ohm
Lead Channel Pacing Threshold Amplitude: 0.75 V
Lead Channel Pacing Threshold Pulse Width: 0.4 ms
Lead Channel Sensing Intrinsic Amplitude: 22.4 mV
Lead Channel Setting Pacing Amplitude: 1.5 V
Lead Channel Setting Sensing Sensitivity: 4 mV
MDC IDC MSMT BATTERY IMPEDANCE: 2441 Ohm
MDC IDC MSMT BATTERY VOLTAGE: 2.74 V
MDC IDC MSMT LEADCHNL RA PACING THRESHOLD PULSEWIDTH: 0.4 ms
MDC IDC MSMT LEADCHNL RV PACING THRESHOLD AMPLITUDE: 1 V
MDC IDC SET LEADCHNL RV PACING AMPLITUDE: 2 V
MDC IDC SET LEADCHNL RV PACING PULSEWIDTH: 0.4 ms
MDC IDC STAT BRADY AP VS PERCENT: 98 %

## 2013-08-10 NOTE — Progress Notes (Signed)
Remote bradycardia transmission.

## 2013-08-13 ENCOUNTER — Telehealth: Payer: Self-pay | Admitting: *Deleted

## 2013-08-13 NOTE — Telephone Encounter (Signed)
Left message to call back in regards to doppler results.

## 2013-08-13 NOTE — Telephone Encounter (Signed)
Message copied by Raiford Simmonds on Mon Aug 13, 2013 11:59 AM ------      Message from: Sanda Klein      Created: Sat Aug 11, 2013 10:55 AM       AAA is relatively small (3.8 cm) and unchanged from last year. Repeat in 12 months - please schedule. ------

## 2013-08-13 NOTE — Telephone Encounter (Signed)
Spoke to daughter Pamala Hurry. Result given . Verbalized understanding

## 2013-08-24 DIAGNOSIS — E031 Congenital hypothyroidism without goiter: Secondary | ICD-10-CM | POA: Diagnosis not present

## 2013-08-24 DIAGNOSIS — R5381 Other malaise: Secondary | ICD-10-CM | POA: Diagnosis not present

## 2013-08-24 DIAGNOSIS — I10 Essential (primary) hypertension: Secondary | ICD-10-CM | POA: Diagnosis not present

## 2013-08-24 DIAGNOSIS — R609 Edema, unspecified: Secondary | ICD-10-CM | POA: Diagnosis not present

## 2013-08-24 DIAGNOSIS — R5383 Other fatigue: Secondary | ICD-10-CM | POA: Diagnosis not present

## 2013-08-24 DIAGNOSIS — I4891 Unspecified atrial fibrillation: Secondary | ICD-10-CM | POA: Diagnosis not present

## 2013-09-03 DIAGNOSIS — N390 Urinary tract infection, site not specified: Secondary | ICD-10-CM | POA: Diagnosis not present

## 2013-09-03 DIAGNOSIS — R209 Unspecified disturbances of skin sensation: Secondary | ICD-10-CM | POA: Diagnosis not present

## 2013-09-26 ENCOUNTER — Encounter: Payer: Self-pay | Admitting: Cardiology

## 2013-10-01 ENCOUNTER — Telehealth: Payer: Self-pay | Admitting: Cardiovascular Disease

## 2013-10-01 NOTE — Telephone Encounter (Signed)
Please call,confused about her pacemaker.

## 2013-10-02 NOTE — Telephone Encounter (Signed)
Went in this chart by mistake.

## 2013-10-05 DIAGNOSIS — N390 Urinary tract infection, site not specified: Secondary | ICD-10-CM | POA: Diagnosis not present

## 2013-10-05 DIAGNOSIS — R5381 Other malaise: Secondary | ICD-10-CM | POA: Diagnosis not present

## 2013-10-05 DIAGNOSIS — R5383 Other fatigue: Secondary | ICD-10-CM | POA: Diagnosis not present

## 2013-10-16 ENCOUNTER — Encounter: Payer: Self-pay | Admitting: Internal Medicine

## 2013-10-16 ENCOUNTER — Encounter: Payer: Self-pay | Admitting: Gastroenterology

## 2013-10-16 DIAGNOSIS — H02059 Trichiasis without entropian unspecified eye, unspecified eyelid: Secondary | ICD-10-CM | POA: Diagnosis not present

## 2013-10-16 DIAGNOSIS — H10509 Unspecified blepharoconjunctivitis, unspecified eye: Secondary | ICD-10-CM | POA: Diagnosis not present

## 2013-10-16 DIAGNOSIS — H04129 Dry eye syndrome of unspecified lacrimal gland: Secondary | ICD-10-CM | POA: Diagnosis not present

## 2013-10-16 DIAGNOSIS — H16109 Unspecified superficial keratitis, unspecified eye: Secondary | ICD-10-CM | POA: Diagnosis not present

## 2013-10-28 ENCOUNTER — Other Ambulatory Visit: Payer: Self-pay | Admitting: Cardiovascular Disease

## 2013-10-30 NOTE — Telephone Encounter (Signed)
This is a Dr [employer] patient.  I will forward to him for review.  Doesn't look like she has had recent labs for TSH.

## 2013-10-30 NOTE — Telephone Encounter (Signed)
Please refill for 30 days if, then refer to PCP

## 2013-12-02 ENCOUNTER — Other Ambulatory Visit: Payer: Self-pay | Admitting: Cardiovascular Disease

## 2013-12-03 NOTE — Telephone Encounter (Signed)
Rx refused. Defer to PCP per Dr Sallyanne Kuster

## 2013-12-14 ENCOUNTER — Telehealth (HOSPITAL_COMMUNITY): Payer: Self-pay | Admitting: *Deleted

## 2013-12-14 ENCOUNTER — Other Ambulatory Visit (HOSPITAL_COMMUNITY): Payer: Self-pay | Admitting: Cardiovascular Disease

## 2013-12-14 DIAGNOSIS — I739 Peripheral vascular disease, unspecified: Secondary | ICD-10-CM

## 2013-12-18 ENCOUNTER — Ambulatory Visit (INDEPENDENT_AMBULATORY_CARE_PROVIDER_SITE_OTHER): Payer: Medicare Other | Admitting: Cardiovascular Disease

## 2013-12-18 ENCOUNTER — Encounter: Payer: Self-pay | Admitting: Cardiovascular Disease

## 2013-12-18 VITALS — BP 143/68 | HR 78 | Resp 16 | Ht 63.0 in | Wt 119.2 lb

## 2013-12-18 DIAGNOSIS — I2510 Atherosclerotic heart disease of native coronary artery without angina pectoris: Secondary | ICD-10-CM | POA: Diagnosis not present

## 2013-12-18 DIAGNOSIS — I480 Paroxysmal atrial fibrillation: Secondary | ICD-10-CM

## 2013-12-18 DIAGNOSIS — I6523 Occlusion and stenosis of bilateral carotid arteries: Secondary | ICD-10-CM

## 2013-12-18 DIAGNOSIS — I714 Abdominal aortic aneurysm, without rupture, unspecified: Secondary | ICD-10-CM

## 2013-12-18 DIAGNOSIS — Z950 Presence of cardiac pacemaker: Secondary | ICD-10-CM

## 2013-12-18 DIAGNOSIS — I739 Peripheral vascular disease, unspecified: Secondary | ICD-10-CM

## 2013-12-18 DIAGNOSIS — E785 Hyperlipidemia, unspecified: Secondary | ICD-10-CM

## 2013-12-18 DIAGNOSIS — Z23 Encounter for immunization: Secondary | ICD-10-CM

## 2013-12-18 LAB — MDC_IDC_ENUM_SESS_TYPE_INCLINIC
Battery Impedance: 2756 Ohm
Brady Statistic AP VP Percent: 1 %
Brady Statistic AP VS Percent: 99 %
Brady Statistic AS VP Percent: 0 %
Brady Statistic AS VS Percent: 0 %
Date Time Interrogation Session: 20151006162353
Lead Channel Impedance Value: 517 Ohm
Lead Channel Impedance Value: 530 Ohm
Lead Channel Pacing Threshold Amplitude: 0.75 V
Lead Channel Pacing Threshold Pulse Width: 0.4 ms
Lead Channel Sensing Intrinsic Amplitude: 11.2 mV
Lead Channel Sensing Intrinsic Amplitude: 2 mV
Lead Channel Setting Pacing Amplitude: 1.5 V
MDC IDC MSMT BATTERY REMAINING LONGEVITY: 22 mo
MDC IDC MSMT BATTERY VOLTAGE: 2.73 V
MDC IDC MSMT LEADCHNL RV PACING THRESHOLD AMPLITUDE: 1 V
MDC IDC MSMT LEADCHNL RV PACING THRESHOLD PULSEWIDTH: 0.4 ms
MDC IDC SET LEADCHNL RV PACING AMPLITUDE: 2 V
MDC IDC SET LEADCHNL RV PACING PULSEWIDTH: 0.4 ms
MDC IDC SET LEADCHNL RV SENSING SENSITIVITY: 5.6 mV

## 2013-12-18 NOTE — Progress Notes (Signed)
Patient ID: [name], female   DOB: May 08, 1917, 80 y.o.   MRN: [id number]     Reason for office visit CAD, atrial fibrillation, permanent pacemaker followup, AAA    [name] is here for followup on her pacemaker as well as extensive vascular disease with coronary and peripheral arterial involvement. She is doing remarkably well. She continues to live independently and does most of her housework herself including vacuuming. She denies angina pectoris, intermittent claudication, syncope or dyspnea. She has not had palpitations. She has occasional headaches, but does not check her blood pressure at home. Denies any focal neurological events and has not had bleeding problems.   She has an extensive and very complicated past history.  She has coronary disease. In 1997 she underwent urgent stenting of the proximal LAD artery for NSTEMI and then underwent a staged stent to the left circumflex coronary artery, complicated by frequent nonsustained ventricular tachycardia. A planned staged intervention to the right coronary artery was therefore delayed, performed later the same year.  She required repeat stenting of all 3 coronary arteries in 2006, when she presented with unstable angina. These procedures were performed on 3 consecutive days and a rather complicated fashion. Drug-eluting stents were placed in the proximal LAD and proximal RCA, with bare-metal stents in the proximal left circumflex and mid LAD. Is made of residual 50% stenosis of the left main coronary artery, but she has not required any revascularization procedure since.  Her last nuclear stress test performed in 2012 and showed low risk findings.  She has an infrarenal abdominal aortic aneurysm has been recognized for 18 years without much progression. Most recent ultrasound in May of 2015 measured at 3.8x3.8 cm. Has received a previous stent to the right common iliac artery and has mild bilateral stenoses in both iliac arteries by her  most recent ultrasound performed in 2014. She does not have intermittent claudication. Bilateral ABIs were greater than 1.0. Stable <50% bilateral internal carotid artery lesions, also unchanged for several years.  She has a history of sinus node dysfunction and paroxysmal atrial fibrillation and received a dual-chamber Medtronic adapta pacemaker in 2009. 1 episode of atrial fibrillation that lasted for over 5 hours occurring last in May. Atrial pacing occurs virtually 100% of the time. Ventricular pacing occurs only 1.2% of the time.  She has been intolerant to numerous statins including pravastatin, atorvastatin, simvastatin 10 daily Crestor. She has not tolerated even Crestor once a week. She failed multaq antiarrhythmic therapy and is now receiving amiodarone ina low dose.  Significant comorbidities also include treated hypothyroidism and hyperlipidemia. She does not have diabetes mellitus and quit smoking decades ago. She has had multiple repeat surgeries for her left hip with the most recent revision being in June of 2013. She uses a walker and occasionally gets around with a cane.  She denies angina. She has occasional shortness of breath and occasional ankle swelling. She was then able to tolerate pravastatin, the most recent lipid lowering agent she has tried due to severe leg weakness and achiness. This progressed to the point that she was afraid she would fall. She is also concerned about losing her hair and wonders if this is related to the beta blocker.       Allergies  Allergen Reactions  . Nubain [Nalbuphine Hcl] Anaphylaxis  . Statins     Leg weakness  . Iodine Rash    Current Outpatient Prescriptions  Medication Sig Dispense Refill  . amiodarone (PACERONE) 200 MG tablet  Take 1 tablet (200 mg total) by mouth daily.  30 tablet  6  . Biotin 1000 MCG tablet Take 1,000 mcg by mouth daily.      . Calcium Carb-Cholecalciferol (CALCIUM 600 + D PO) Take 1 tablet by mouth daily.      .  clopidogrel (PLAVIX) 75 MG tablet Take 1 tablet (75 mg total) by mouth daily with breakfast.  90 tablet  10  . cycloSPORINE (RESTASIS) 0.05 % ophthalmic emulsion Place 1 drop into both eyes 2 (two) times daily.      . ferrous sulfate 325 (65 FE) MG tablet Take 325 mg by mouth daily with breakfast.       . furosemide (LASIX) 20 MG tablet Take 20 mg by mouth daily.       . hydrocortisone (ANUSOL-HC) 2.5 % rectal cream Apply rectally 2 times daily  30 g  1  . hydrocortisone (ANUSOL-HC) 25 MG suppository Place 1 suppository (25 mg total) rectally at bedtime.  12 suppository  2  . levothyroxine (SYNTHROID, LEVOTHROID) 125 MCG tablet TAKE 1 TABLET BY MOUTH EVERY DAY  30 tablet  0  . metoprolol succinate (TOPROL-XL) 25 MG 24 hr tablet Take 12.5 mg by mouth daily.      . potassium chloride SA (K-DUR,KLOR-CON) 20 MEQ tablet Take 20 mEq by mouth daily.       . ranitidine (ZANTAC) 300 MG tablet Take 300 mg by mouth at bedtime.       . vitamin B-12 (CYANOCOBALAMIN) 1000 MCG tablet Take 1,000 mcg by mouth daily.       No current facility-administered medications for this visit.    Past Medical History  Diagnosis Date  . GI bleed 2007  . Hypertension   . TIA (transient ischemic attack)   . Hypothyroidism   . Abdominal aortic aneurysm   . CAD (coronary artery disease)   . SSS (sick sinus syndrome) 10/05/2007    Medtronic Adapta  . Myocardial infarction   . Atrial fibrillation   . COPD (chronic obstructive pulmonary disease)   . Asthma     hx  . Anemia     hx  . Blood transfusion   . UTI (lower urinary tract infection)     hx  . Renal failure, chronic   . H/O hiatal hernia   . GERD (gastroesophageal reflux disease)   . Stroke   . Osteoporosis   . Blood transfusion without reported diagnosis     Past Surgical History  Procedure Laterality Date  . Pacemaker insertion  10/05/2007    Medtronic Adapta  . Coronary stent placement      x9  . Cardiac catheterization    . Fracture surgery       femur fx, /w ORIF into HIP- 2008  . Total hip arthroplasty      planned for 08/27/2011 Left  . Total hip arthroplasty  08/27/2011    Procedure: TOTAL HIP ARTHROPLASTY;  Surgeon: Kerin Salen, MD;  Location: [place];  Service: Orthopedics;  Laterality: Right;  . US echocardiography  08/23/2011    EF 50%,LA mod. dilated,mild to mod. mitral annular ca+,mod. MR,mild to mod TR,mild to mod. PH,trace AI.  [name] Nm myocar perf wall motion  10/08/2010    mild apical anterior ischemia    Family History  Problem Relation Age of Onset  . Emphysema Father   . Heart disease Mother   . Cancer Brother     History   Social History  . Marital Status:  Widowed    Spouse Name: N/A    Number of Children: 4  . Years of Education: N/A   Occupational History  . Retired    Social History Main Topics  . Smoking status: Former Smoker    Types: Cigarettes    Quit date: 07/16/2013  . Smokeless tobacco: Never Used     Comment: quit 40 yrs  . Alcohol Use: No  . Drug Use: No  . Sexual Activity: Yes    Birth Control/ Protection: Post-menopausal   Other Topics Concern  . Not on file   Social History Narrative  . No narrative on file    Review of systems: The patient specifically denies any chest pain at rest or with exertion, dyspnea at rest or with light exertion, orthopnea, paroxysmal nocturnal dyspnea, syncope, palpitations, focal neurological deficits, intermittent claudication,unexplained weight gain, cough, hemoptysis or wheezing.  The patient also denies abdominal pain, nausea, vomiting, dysphagia, diarrhea, constipation, polyuria, polydipsia, dysuria, hematuria, frequency, urgency, abnormal bleeding or bruising, fever, chills, unexpected weight changes, mood swings, change in skin or hair texture, change in voice quality, auditory or visual problems, allergic reactions or rashes, new musculoskeletal complaints other than usual "aches and pains".    PHYSICAL EXAM BP 143/68  Pulse 78  Resp 16  Ht  5\' 3"  (1.6 m)  Wt 54.069 kg (119 lb 3.2 oz)  BMI 21.12 kg/m2 General: Alert, oriented x3, no distress.  Head: no evidence of trauma, PERRL, EOMI, no exophtalmos or lid lag, no myxedema, no xanthelasma; normal ears, nose and oropharynx  Neck: normal jugular venous pulsations and no hepatojugular reflux; brisk carotid pulses without delay and bilateral carotid bruits  Chest: clear to auscultation, no signs of consolidation by percussion or palpation, normal fremitus, symmetrical and full respiratory excursions; healthy subclavian pacemaker site  Cardiovascular: normal position and quality of the apical impulse, regular rhythm, normal first and second heart sounds, no rubs or, peaking 2/6 aortic ejection murmur with limited radiation gallops  Abdomen: no tenderness or distention, no masses by palpation, no abnormal pulsatility or arterial bruits, normal bowel sounds, no hepatosplenomegaly  Extremities: Varicose veins bilaterally ; no clubbing, cyanosis or edema; chronic brawny changes are seen; 2+ radial, ulnar and brachial pulses bilaterally; 2+ right femoral, posterior tibial and dorsalis pedis pulses; 2+ left femoral, posterior tibial and dorsalis pedis pulses; no subclavian or femoral bruits  Neurological: grossly nonfocal. Very hard of hearing   EKG: Atrial paced, ventricular sensed, incomplete left bundle branch block, QRS 114 ms  Lipid Panel     Component Value Date/Time   CHOL 182 12/01/2012 1035   TRIG 118 12/01/2012 1035   HDL 56 12/01/2012 1035   CHOLHDL 3.3 12/01/2012 1035   VLDL 24 12/01/2012 1035   LDLCALC 102* 12/01/2012 1035    BMET    Component Value Date/Time   NA 143 07/08/2013 1025   K 4.1 07/08/2013 1025   CL 99 07/08/2013 1025   CO2 30 07/08/2013 1025   GLUCOSE 88 07/08/2013 1025   BUN 28* 07/08/2013 1025   CREATININE 1.57* 07/08/2013 1025   CREATININE 1.50* 12/01/2012 1035   CALCIUM 9.6 07/08/2013 1025   GFRNONAA 27* 07/08/2013 1025   GFRAA 31* 07/08/2013 1025      ASSESSMENT AND PLAN CAD (coronary artery disease)  Complicated history of multiple percutaneous revascularization procedures and residual stenosis of the left main coronary artery. Thankfully right now she is asymptomatic and her most recent functional study shows low risk findings. I would be hard  pressed to ever recommend further revascularization procedures in this nonagenarian. Focus on symptom control and risk factor treatment.  Hyperlipidemia, statin intolerance  Statin intolerant, consider PSCK-9 inhibitors if they become available for that indication.  Paroxysmal atrial fibrillation  Amiodarone seems to be doing a good job of controlling her arrhythmia. No significant atrial fibrillation since the 5 hour episode that occurred in May of 2014. Her risk of embolic events is high, as is her risk of bleeding complications. This has previously been thought out and discussed with the patient and her family. She is on clopidogrel and is not felt to be a good candidate for warfarin or a novel anticoagulant.  Dr. Johna Roles has checked Liver function tests, thyroid function tests - get those results. Pacemaker - dual chamber Medtronic 2009  Pacemaker check in clinic. Normal device function. Thresholds, sensing, impedances consistent with previous measurements.  Estimated longevity 22 months. Patient will follow up via [facility] every 3 months and with [facility] in 12 months.  AAA (abdominal aortic aneurysm)  Asymptomatic, stable in size at about 3.8 cm - recheck next May Bilateral carotid artery stenosis  Mild. Asymptomatic.   Orders Placed This Encounter  Procedures  . Implantable device check  . EKG 12-Lead   No orders of the defined types were placed in this encounter.    Holli Humbles, MD, [place] [phone] office [phone] pager

## 2013-12-18 NOTE — Patient Instructions (Signed)
Remote monitoring is used to monitor your pacemaker from home. This monitoring reduces the number of office visits required to check your device to one time per year. It allows [place] to keep an eye on the functioning of your device to ensure it is working properly. You are scheduled for a device check from home on 03-20-2014. You may send your transmission at any time that day. If you have a wireless device, the transmission will be sent automatically. After your physician reviews your transmission, you will receive a postcard with your next transmission date.  Your physician recommends that you schedule a follow-up appointment in: 12 months with [name]

## 2013-12-21 ENCOUNTER — Ambulatory Visit (HOSPITAL_COMMUNITY)
Admission: RE | Admit: 2013-12-21 | Discharge: 2013-12-21 | Disposition: A | Discharge status: Home / Self Care | Payer: Medicare Other | Source: Ambulatory Visit | Attending: Cardiology | Admitting: Cardiology

## 2013-12-21 DIAGNOSIS — I739 Peripheral vascular disease, unspecified: Secondary | ICD-10-CM | POA: Insufficient documentation

## 2013-12-21 NOTE — Progress Notes (Signed)
Lower Extremity Arterial Duplex Completed. °Brianna L Mazza,RVT °

## 2013-12-31 ENCOUNTER — Encounter: Payer: Self-pay | Admitting: Cardiovascular Disease

## 2014-01-01 ENCOUNTER — Telehealth: Payer: Self-pay | Admitting: *Deleted

## 2014-01-01 DIAGNOSIS — I714 Abdominal aortic aneurysm, without rupture, unspecified: Secondary | ICD-10-CM

## 2014-01-01 NOTE — Telephone Encounter (Signed)
Message copied by Tressa Busman on Tue Jan 01, 2014  8:27 AM ------      Message from: Sanda Klein      Created: Mon Dec 31, 2013  5:03 PM       Her abdominal aortic aneurysm is about the same size as last year, the stent in her right iliac is normal, the narrowing in her left iliac is 50-69% and is unchanged from last year. Repeat in 12 months ------

## 2014-01-01 NOTE — Telephone Encounter (Signed)
Doppler study called to daughter.  No changes from last study and repeat in one year.  Voiced understanding.

## 2014-01-13 ENCOUNTER — Other Ambulatory Visit: Payer: Self-pay | Admitting: Cardiovascular Disease

## 2014-01-18 DIAGNOSIS — R51 Headache: Secondary | ICD-10-CM | POA: Diagnosis not present

## 2014-01-25 DIAGNOSIS — R51 Headache: Secondary | ICD-10-CM | POA: Diagnosis not present

## 2014-02-27 DIAGNOSIS — M79672 Pain in left foot: Secondary | ICD-10-CM | POA: Diagnosis not present

## 2014-02-27 DIAGNOSIS — H6121 Impacted cerumen, right ear: Secondary | ICD-10-CM | POA: Diagnosis not present

## 2014-02-27 DIAGNOSIS — M79674 Pain in right toe(s): Secondary | ICD-10-CM | POA: Diagnosis not present

## 2014-03-11 ENCOUNTER — Ambulatory Visit (INDEPENDENT_AMBULATORY_CARE_PROVIDER_SITE_OTHER): Payer: Medicare Other | Admitting: Family Medicine

## 2014-03-11 VITALS — BP 142/88 | HR 88 | Temp 97.2°F | Resp 16 | Ht 62.5 in | Wt 114.6 lb

## 2014-03-11 DIAGNOSIS — I2510 Atherosclerotic heart disease of native coronary artery without angina pectoris: Secondary | ICD-10-CM

## 2014-03-11 DIAGNOSIS — I739 Peripheral vascular disease, unspecified: Secondary | ICD-10-CM

## 2014-03-11 DIAGNOSIS — I83023 Varicose veins of left lower extremity with ulcer of ankle: Secondary | ICD-10-CM

## 2014-03-11 DIAGNOSIS — I83025 Varicose veins of left lower extremity with ulcer other part of foot: Secondary | ICD-10-CM

## 2014-03-11 DIAGNOSIS — I83028 Varicose veins of left lower extremity with ulcer other part of lower leg: Secondary | ICD-10-CM | POA: Diagnosis not present

## 2014-03-11 DIAGNOSIS — I83021 Varicose veins of left lower extremity with ulcer of thigh: Secondary | ICD-10-CM

## 2014-03-11 DIAGNOSIS — I83022 Varicose veins of left lower extremity with ulcer of calf: Secondary | ICD-10-CM

## 2014-03-11 DIAGNOSIS — I83029 Varicose veins of left lower extremity with ulcer of unspecified site: Secondary | ICD-10-CM

## 2014-03-11 DIAGNOSIS — I83024 Varicose veins of left lower extremity with ulcer of heel and midfoot: Secondary | ICD-10-CM

## 2014-03-11 DIAGNOSIS — IMO0001 Reserved for inherently not codable concepts without codable children: Secondary | ICD-10-CM

## 2014-03-11 MED ORDER — AMOXICILLIN-POT CLAVULANATE 875-125 MG PO TABS: 1.0000 | ORAL_TABLET | Freq: Two times a day (BID) | ORAL | Status: DC

## 2014-03-11 NOTE — Patient Instructions (Signed)
Please see me on Wednesday for a recheck of your ulcer.  You can leave the bandage on your ankle until we recheck you.   Start the augmentin twice a day- antibiotic

## 2014-03-11 NOTE — Progress Notes (Signed)
[facility] [address] [place] [address] [phone]  Date:  03/11/2014   Name:  [name]   DOB:  1917-09-03   MRN:  [id number]  PCP:  Florina Ou, MD    Chief Complaint: Foot Pain and Cerumen Impaction   History of Present Illness:  [name] is a 80 y.o. very pleasant female patient who presents with the following:  Here today as a new patient- complicated PMhx.  Most history given by her two daughters as she is quite HOH.  She has noted worsening pain and swelling in both feet for about one month, although she has a much longer history of "bad circulation."  She is a severe vasculopath and is being followed by Dr. Sallyanne Kuster. She had had coronary stents X9 and a stent to her right common iliac, as well as a stable AAA.    More recently they have noted a small ulcer on the left lateral malleolus and tenderness of the tip of her right 4th toe.  She thinks she may have a corn on the toe. She has treated these with betadine.  She reports no prior history of foot ulcers.    Her most recent venous doppler in October of this year showed a stable right IAC stent and 50- 69% narrowing of the left iliac.   Planned for repeat in one year.  They had planned to get her hearing aids updated but were told by audiology that she had too much wax in her ears.  She has been using OTC drops and they would like to see if her ears are clear.      Patient Active Problem List   Diagnosis Date Noted  . Rectal bleeding 07/16/2013  . Hyperlipidemia, statin intolerance 04/30/2013  . Paroxysmal atrial fibrillation 04/30/2013  . Pacemaker - dual chamber Medtronic 2009 04/30/2013  . AAA (abdominal aortic aneurysm) 04/30/2013  . Bilateral carotid artery stenosis 04/30/2013  . PAD (peripheral artery disease) 04/30/2013  . Constipation 08/31/2011  . Hypotension 08/28/2011  . S/P total hip arthroplasty 08/28/2011  . CAD (coronary artery disease) 08/28/2011  . Hypothyroid  08/28/2011  . UTI (lower urinary tract infection) 08/28/2011  . Thrombocytopenia 08/28/2011    Past Medical History  Diagnosis Date  . GI bleed 2007  . Hypertension   . TIA (transient ischemic attack)   . Hypothyroidism   . Abdominal aortic aneurysm   . CAD (coronary artery disease)   . SSS (sick sinus syndrome) 10/05/2007    Medtronic Adapta  . Myocardial infarction   . Atrial fibrillation   . COPD (chronic obstructive pulmonary disease)   . Asthma     hx  . Anemia     hx  . Blood transfusion   . UTI (lower urinary tract infection)     hx  . Renal failure, chronic   . H/O hiatal hernia   . GERD (gastroesophageal reflux disease)   . Stroke   . Osteoporosis   . Blood transfusion without reported diagnosis     Past Surgical History  Procedure Laterality Date  . Pacemaker insertion  10/05/2007    Medtronic Adapta  . Coronary stent placement      x9  . Cardiac catheterization    . Fracture surgery      femur fx, /w ORIF into HIP- 2008  . Total hip arthroplasty      planned for 08/27/2011 Left  . Total hip arthroplasty  08/27/2011  Procedure: TOTAL HIP ARTHROPLASTY;  Surgeon: Kerin Salen, MD;  Location: [place];  Service: Orthopedics;  Laterality: Right;  . US echocardiography  08/23/2011    EF 50%,LA mod. dilated,mild to mod. mitral annular ca+,mod. MR,mild to mod TR,mild to mod. PH,trace AI.  [name] Nm myocar perf wall motion  10/08/2010    mild apical anterior ischemia    History  Substance Use Topics  . Smoking status: Former Smoker    Types: Cigarettes    Quit date: 07/16/2013  . Smokeless tobacco: Never Used     Comment: quit 40 yrs  . Alcohol Use: No    Family History  Problem Relation Age of Onset  . Emphysema Father   . Heart disease Mother   . Cancer Brother     Allergies  Allergen Reactions  . Nubain [Nalbuphine Hcl] Anaphylaxis  . Statins     Leg weakness  . Iodine Rash    Medication list has been reviewed and updated.  Current Outpatient  Prescriptions on File Prior to Visit  Medication Sig Dispense Refill  . amiodarone (PACERONE) 200 MG tablet TAKE 1 TABLET BY MOUTH DAILY. 30 tablet 6  . Biotin 1000 MCG tablet Take 1,000 mcg by mouth daily.    . Calcium Carb-Cholecalciferol (CALCIUM 600 + D PO) Take 1 tablet by mouth daily.    . clopidogrel (PLAVIX) 75 MG tablet Take 1 tablet (75 mg total) by mouth daily with breakfast. 90 tablet 10  . cycloSPORINE (RESTASIS) 0.05 % ophthalmic emulsion Place 1 drop into both eyes 2 (two) times daily.    . ferrous sulfate 325 (65 FE) MG tablet Take 325 mg by mouth daily with breakfast.     . furosemide (LASIX) 20 MG tablet Take 20 mg by mouth daily.     . hydrocortisone (ANUSOL-HC) 2.5 % rectal cream Apply rectally 2 times daily 30 g 1  . hydrocortisone (ANUSOL-HC) 25 MG suppository Place 1 suppository (25 mg total) rectally at bedtime. 12 suppository 2  . levothyroxine (SYNTHROID, LEVOTHROID) 125 MCG tablet TAKE 1 TABLET BY MOUTH EVERY DAY 30 tablet 0  . metoprolol succinate (TOPROL-XL) 25 MG 24 hr tablet Take 12.5 mg by mouth daily.    . potassium chloride SA (K-DUR,KLOR-CON) 20 MEQ tablet Take 20 mEq by mouth daily.     . ranitidine (ZANTAC) 300 MG tablet Take 300 mg by mouth at bedtime.     . vitamin B-12 (CYANOCOBALAMIN) 1000 MCG tablet Take 1,000 mcg by mouth daily.     No current facility-administered medications on file prior to visit.    Review of Systems: As per HPI- otherwise negative.   Physical Examination: Filed Vitals:   03/11/14 1612  BP: 142/88  Pulse: 88  Temp: 97.2 F (36.2 C)  Resp: 16   Filed Vitals:   03/11/14 1612  Height: 5' 2.5" (1.588 m)  Weight: 114 lb 9.6 oz (51.982 kg)   Body mass index is 20.61 kg/(m^2). Ideal Body Weight: Weight in (lb) to have BMI = 25: 138.6  GEN: WDWN, NAD, Non-toxic, A & O x 3, elderly lady who is HOH but pleasant  HEENT: Atraumatic, Normocephalic. Neck supple. No masses, No LAD.  She has some retained dead skin in the right  ear canal.  This was irrigated and removed.  No wax remains,  She does have evidence of prior trauma/ recurrent OM which she confirms.   Ears and Nose: No external deformity. CV: RRR, No M/G/R. No JVD. No thrill. No extra  heart sounds. PULM: CTA B, no wheezes, crackles, rhonchi. No retractions. No resp. distress. No accessory muscle use. EXTR: No clubbing. Skin of bilateral legs is shiny and hairless consistent with chronic vascular insufficiency.  She has bilateral 1+ pitting edema of her feet and purplish discoloration of her feet and toes.  Corn on the distal right 4th toe.  Able to feel right DP pulse easily.  Normal cap refill right.   Unable to appreciate DP pulse on the left but she has normal cap refill. Dime sized shallow ulcer over the left lateral malleolus. Seems to have normal sensation of all toes on exam, has discomfort with palpation of her toes.   Prepped ulcer with peroxide, dressed with vaseline gauze and tube gauze.   NEURO Normal gait for age, uses a cane.   PSYCH: Normally interactive. Conversant. Not depressed or anxious appearing.  Calm demeanor.   Assessment and Plan: Venous stasis ulcer, left - Plan: amoxicillin-clavulanate (AUGMENTIN) 875-125 MG per tablet, Lower Extremity Arterial Doppler Bilateral, Lower Extremity Venous Duplex Bilateral, CANCELED: US Venous Img Lower Bilateral, CANCELED: US Venous Img Lower Bilateral  Peripheral arterial disease  Start agumentin for venous stasis ulcer and plan to recheck in 48 hours.  Will also set up venous and arterial dopplers asap.  Her family does not think they can get her to these appts until 1/7- will check on her progress on 12/30 and see if more urgent scans are indicated    Signed Lamar Blinks, MD

## 2014-03-13 ENCOUNTER — Ambulatory Visit (INDEPENDENT_AMBULATORY_CARE_PROVIDER_SITE_OTHER): Payer: Medicare Other | Admitting: Family Medicine

## 2014-03-13 VITALS — BP 122/74 | HR 90 | Temp 97.6°F | Resp 18 | Ht 62.5 in | Wt 116.4 lb

## 2014-03-13 DIAGNOSIS — I83028 Varicose veins of left lower extremity with ulcer other part of lower leg: Secondary | ICD-10-CM

## 2014-03-13 DIAGNOSIS — I83022 Varicose veins of left lower extremity with ulcer of calf: Secondary | ICD-10-CM | POA: Diagnosis not present

## 2014-03-13 DIAGNOSIS — I70209 Unspecified atherosclerosis of native arteries of extremities, unspecified extremity: Secondary | ICD-10-CM | POA: Diagnosis not present

## 2014-03-13 DIAGNOSIS — I83024 Varicose veins of left lower extremity with ulcer of heel and midfoot: Secondary | ICD-10-CM

## 2014-03-13 DIAGNOSIS — I83023 Varicose veins of left lower extremity with ulcer of ankle: Secondary | ICD-10-CM | POA: Diagnosis not present

## 2014-03-13 DIAGNOSIS — I83029 Varicose veins of left lower extremity with ulcer of unspecified site: Secondary | ICD-10-CM

## 2014-03-13 DIAGNOSIS — I83025 Varicose veins of left lower extremity with ulcer other part of foot: Secondary | ICD-10-CM | POA: Diagnosis not present

## 2014-03-13 DIAGNOSIS — I2510 Atherosclerotic heart disease of native coronary artery without angina pectoris: Secondary | ICD-10-CM

## 2014-03-13 DIAGNOSIS — IMO0001 Reserved for inherently not codable concepts without codable children: Secondary | ICD-10-CM

## 2014-03-13 DIAGNOSIS — I83021 Varicose veins of left lower extremity with ulcer of thigh: Secondary | ICD-10-CM | POA: Diagnosis not present

## 2014-03-13 NOTE — Progress Notes (Signed)
[facility] [address] [place] [address] [phone]  Date:  03/13/2014   Name:  [name]   DOB:  [birth year]   MRN:  [id number]  PCP:  Florina Ou, MD    Chief Complaint: Follow-up; Peripheral arterial disease; and Ulcer   History of Present Illness:  [name] is a 80 y.o. very pleasant female patient who presents with the following:  Seen 2 days ago and started on augmentin for an ulcer of the left lateral ankle. She is here today for a recheck.  She is no worse.  Her granddaughter confirms that her feet are generally quite swollen and can be purple.  They do not look any worse than usual to her today.  I would like to get venous and arterial dopplers to check her vascular status.  Also would ideally like to have her visit the wound center but this is very difficult as she does not drive.  Home health nursing is a more practical solution for her.  Her cardiologist Dr. Sallyanne Kuster has been managing her vascular disease and she will see him within the next couple of weeks.  If her disease is advancing consider a consult with vascular surgery.  However doubt that they would recommend any aggressive intervention given her age and co-morbidities.     [place]-  Granddaughter [name] is with her today and can take her for dopplers tomorrow  Patient Active Problem List   Diagnosis Date Noted  . Rectal bleeding 07/16/2013  . Hyperlipidemia, statin intolerance 04/30/2013  . Paroxysmal atrial fibrillation 04/30/2013  . Pacemaker - dual chamber Medtronic 2009 04/30/2013  . AAA (abdominal aortic aneurysm) 04/30/2013  . Bilateral carotid artery stenosis 04/30/2013  . PAD (peripheral artery disease) 04/30/2013  . Constipation 08/31/2011  . Hypotension 08/28/2011  . S/P total hip arthroplasty 08/28/2011  . CAD (coronary artery disease) 08/28/2011  . Hypothyroid 08/28/2011  . UTI (lower urinary tract infection) 08/28/2011  . Thrombocytopenia 08/28/2011     Past Medical History  Diagnosis Date  . GI bleed 2007  . Hypertension   . TIA (transient ischemic attack)   . Hypothyroidism   . Abdominal aortic aneurysm   . CAD (coronary artery disease)   . SSS (sick sinus syndrome) 10/05/2007    Medtronic Adapta  . Myocardial infarction   . Atrial fibrillation   . COPD (chronic obstructive pulmonary disease)   . Asthma     hx  . Anemia     hx  . Blood transfusion   . UTI (lower urinary tract infection)     hx  . Renal failure, chronic   . H/O hiatal hernia   . GERD (gastroesophageal reflux disease)   . Stroke   . Osteoporosis   . Blood transfusion without reported diagnosis     Past Surgical History  Procedure Laterality Date  . Pacemaker insertion  10/05/2007    Medtronic Adapta  . Coronary stent placement      x9  . Cardiac catheterization    . Fracture surgery      femur fx, /w ORIF into HIP- 2008  . Total hip arthroplasty      planned for 08/27/2011 Left  . Total hip arthroplasty  08/27/2011    Procedure: TOTAL HIP ARTHROPLASTY;  Surgeon: Kerin Salen, MD;  Location: [facility];  Service: Orthopedics;  Laterality: Right;  . US echocardiography  08/23/2011    EF 50%,LA mod. dilated,mild to mod. mitral annular  ca+,mod. MR,mild to mod TR,mild to mod. PH,trace AI.  [name] Nm myocar perf wall motion  10/08/2010    mild apical anterior ischemia    History  Substance Use Topics  . Smoking status: Former Smoker    Types: Cigarettes    Quit date: 07/16/2013  . Smokeless tobacco: Never Used     Comment: quit 40 yrs  . Alcohol Use: No    Family History  Problem Relation Age of Onset  . Emphysema Father   . Heart disease Mother   . Cancer Brother     Allergies  Allergen Reactions  . Nubain [Nalbuphine Hcl] Anaphylaxis  . Statins     Leg weakness  . Iodine Rash    Medication list has been reviewed and updated.  Current Outpatient Prescriptions on File Prior to Visit  Medication Sig Dispense Refill  . amiodarone (PACERONE)  200 MG tablet TAKE 1 TABLET BY MOUTH DAILY. 30 tablet 6  . amoxicillin-clavulanate (AUGMENTIN) 875-125 MG per tablet Take 1 tablet by mouth 2 (two) times daily. 20 tablet 0  . Biotin 1000 MCG tablet Take 1,000 mcg by mouth daily.    . Calcium Carb-Cholecalciferol (CALCIUM 600 + D PO) Take 1 tablet by mouth daily.    . clopidogrel (PLAVIX) 75 MG tablet Take 1 tablet (75 mg total) by mouth daily with breakfast. 90 tablet 10  . cycloSPORINE (RESTASIS) 0.05 % ophthalmic emulsion Place 1 drop into both eyes 2 (two) times daily.    . ferrous sulfate 325 (65 FE) MG tablet Take 325 mg by mouth daily with breakfast.     . furosemide (LASIX) 20 MG tablet Take 20 mg by mouth daily.     . hydrocortisone (ANUSOL-HC) 2.5 % rectal cream Apply rectally 2 times daily 30 g 1  . hydrocortisone (ANUSOL-HC) 25 MG suppository Place 1 suppository (25 mg total) rectally at bedtime. 12 suppository 2  . levothyroxine (SYNTHROID, LEVOTHROID) 125 MCG tablet TAKE 1 TABLET BY MOUTH EVERY DAY 30 tablet 0  . metoprolol succinate (TOPROL-XL) 25 MG 24 hr tablet Take 12.5 mg by mouth daily.    . potassium chloride SA (K-DUR,KLOR-CON) 20 MEQ tablet Take 20 mEq by mouth daily.     . ranitidine (ZANTAC) 300 MG tablet Take 300 mg by mouth at bedtime.     . vitamin B-12 (CYANOCOBALAMIN) 1000 MCG tablet Take 1,000 mcg by mouth daily.     No current facility-administered medications on file prior to visit.    Review of Systems:  As per HPI- otherwise negative.   Physical Examination: Filed Vitals:   03/13/14 1137  BP: 122/74  Pulse: 90  Temp: 97.6 F (36.4 C)  Resp: 18   Filed Vitals:   03/13/14 1137  Height: 5' 2.5" (1.588 m)  Weight: 116 lb 6.4 oz (52.799 kg)   Body mass index is 20.94 kg/(m^2). Ideal Body Weight: Weight in (lb) to have BMI = 25: 138.6   GEN: WDWN, NAD, Non-toxic, Alert & Oriented x 3, elderly lady in no distress HEENT: Atraumatic, Normocephalic.  Ears and Nose: No external deformity. EXTR: No  clubbing/cyanosis/edema NEURO: Normal gait for pt, uses a cane PSYCH: Normally interactive. Conversant. Not depressed or anxious appearing.  Calm demeanor.  Left lateral malleolus displays a shallow ulcer, just under the diameter of a dime.  This is cleaned with peroxide, redressed with vaseline gauze and tube gauze Unable to appreciate pulses in either foot, but she has normal cap refill and sensation of all toes.  Worse venous congestion and swelling on the left.  Stable small corn on the distal right 4th toe  Assessment and Plan: Venous stasis ulcer, left - Plan: Lower Extremity Arterial Doppler Bilateral, Lower Extremity Venous Duplex Bilateral, Ambulatory referral to Home Health  Atherosclerotic peripheral vascular disease - Plan: Lower Extremity Arterial Doppler Bilateral, Lower Extremity Venous Duplex Bilateral  We were able to arrange venous and arterial studies for her tomorrow.  Will follow-up pending these results.  Also ordered home health to monitor and dress her ulcer.  Continue augmentin. Left VM for Melissa with the details regarding her studies; she plans to take her for these tomorrow   Signed Lamar Blinks, MD

## 2014-03-14 ENCOUNTER — Ambulatory Visit (HOSPITAL_COMMUNITY)
Admission: RE | Admit: 2014-03-14 | Discharge: 2014-03-14 | Disposition: A | Discharge status: Home / Self Care | Payer: Medicare Other | Source: Ambulatory Visit | Attending: Family Medicine | Admitting: Family Medicine

## 2014-03-14 DIAGNOSIS — I70209 Unspecified atherosclerosis of native arteries of extremities, unspecified extremity: Secondary | ICD-10-CM

## 2014-03-14 DIAGNOSIS — M7989 Other specified soft tissue disorders: Secondary | ICD-10-CM | POA: Insufficient documentation

## 2014-03-14 DIAGNOSIS — I83025 Varicose veins of left lower extremity with ulcer other part of foot: Secondary | ICD-10-CM | POA: Diagnosis not present

## 2014-03-14 DIAGNOSIS — M79605 Pain in left leg: Secondary | ICD-10-CM | POA: Diagnosis not present

## 2014-03-14 DIAGNOSIS — I83023 Varicose veins of left lower extremity with ulcer of ankle: Secondary | ICD-10-CM | POA: Diagnosis not present

## 2014-03-14 DIAGNOSIS — IMO0001 Reserved for inherently not codable concepts without codable children: Secondary | ICD-10-CM

## 2014-03-14 DIAGNOSIS — I83028 Varicose veins of left lower extremity with ulcer other part of lower leg: Secondary | ICD-10-CM | POA: Diagnosis not present

## 2014-03-14 NOTE — Progress Notes (Addendum)
*  PRELIMINARY RESULTS* Vascular Ultrasound     ARTERIAL  ABI completed:    RIGHT    LEFT    PRESSURE WAVEFORM  PRESSURE WAVEFORM  BRACHIAL 150  BRACHIAL 139   DP 148  DP 143   AT   AT    PT 160  PT 122   PER   PER    GREAT TOE  NA GREAT TOE  NA    RIGHT LEFT  ABI 0.99 0.81     Abnormal LEFT  ABI of 0.81  Mild arterial insufficiency / disease.  [name] 03/14/2014, 2:53 PM

## 2014-03-14 NOTE — Progress Notes (Signed)
   Negative DVT BILATERALLY.  Lindwood Coke, RVT 03/14/2014, 2:59 PM

## 2014-03-19 ENCOUNTER — Encounter: Payer: Self-pay | Admitting: Family Medicine

## 2014-03-20 ENCOUNTER — Telehealth: Payer: Self-pay | Admitting: Family Medicine

## 2014-03-20 ENCOUNTER — Ambulatory Visit (INDEPENDENT_AMBULATORY_CARE_PROVIDER_SITE_OTHER): Payer: Medicare Other | Admitting: *Deleted

## 2014-03-20 DIAGNOSIS — I480 Paroxysmal atrial fibrillation: Secondary | ICD-10-CM

## 2014-03-20 NOTE — Telephone Encounter (Signed)
Received her doppler results- all are normal Called her daughter Pamala Hurry but could not [facility] Called her daughter [name] and [facility]- received her dopplers, would like to see her for a recheck this week

## 2014-03-20 NOTE — Progress Notes (Signed)
Remote pacemaker transmission.   

## 2014-03-22 LAB — MDC_IDC_ENUM_SESS_TYPE_REMOTE
Battery Impedance: 3098 Ohm
Battery Voltage: 2.73 V
Brady Statistic AS VP Percent: 0 %
Date Time Interrogation Session: 20160105213917
Lead Channel Impedance Value: 525 Ohm
Lead Channel Impedance Value: 528 Ohm
Lead Channel Pacing Threshold Amplitude: 0.625 V
Lead Channel Pacing Threshold Amplitude: 1 V
Lead Channel Pacing Threshold Pulse Width: 0.4 ms
Lead Channel Sensing Intrinsic Amplitude: 8 mV
Lead Channel Setting Pacing Amplitude: 1.5 V
Lead Channel Setting Sensing Sensitivity: 4 mV
MDC IDC MSMT BATTERY REMAINING LONGEVITY: 19 mo
MDC IDC MSMT LEADCHNL RV PACING THRESHOLD PULSEWIDTH: 0.4 ms
MDC IDC SET LEADCHNL RV PACING AMPLITUDE: 2 V
MDC IDC SET LEADCHNL RV PACING PULSEWIDTH: 0.4 ms
MDC IDC STAT BRADY AP VP PERCENT: 0 %
MDC IDC STAT BRADY AP VS PERCENT: 100 %
MDC IDC STAT BRADY AS VS PERCENT: 0 %

## 2014-03-25 ENCOUNTER — Ambulatory Visit (INDEPENDENT_AMBULATORY_CARE_PROVIDER_SITE_OTHER): Payer: Medicare Other | Admitting: Family Medicine

## 2014-03-25 VITALS — BP 116/76 | HR 86 | Temp 97.6°F | Resp 16 | Ht 62.5 in | Wt 113.0 lb

## 2014-03-25 DIAGNOSIS — L97301 Non-pressure chronic ulcer of unspecified ankle limited to breakdown of skin: Secondary | ICD-10-CM | POA: Diagnosis not present

## 2014-03-25 NOTE — Progress Notes (Signed)
[facility] [address] [place] [address] [phone]  Date:  03/25/2014   Name:  [name]   DOB:  [birth year]   MRN:  [id number]  PCP:  Florina Ou, MD    Chief Complaint: Follow-up   History of Present Illness:  [name] is a 81 y.o. very pleasant female patient who presents with the following:  Seen here for a venous stasis ulcer a couple of times in late December 2015.  We started her on augmentin for her left lateral ankle ulcer; however they actually just started on this medication 3 days ago.  Her daughter feels like the ulcer looks better since she started the medication.  She had repeat dopplers (venous and arterial) of her legs that looked ok.  Their cardiologist Dr. Sallyanne Kuster called them to talk over the doppler results; he has been keeping an eye on her left leg circulation regularly   They have seen a podiatrist with [facility] in the past, Dr. Blenda Mounts and would like to see him again to work on her foot issues.  Patient Active Problem List   Diagnosis Date Noted  . Rectal bleeding 07/16/2013  . Hyperlipidemia, statin intolerance 04/30/2013  . Paroxysmal atrial fibrillation 04/30/2013  . Pacemaker - dual chamber Medtronic 2009 04/30/2013  . AAA (abdominal aortic aneurysm) 04/30/2013  . Bilateral carotid artery stenosis 04/30/2013  . PAD (peripheral artery disease) 04/30/2013  . Constipation 08/31/2011  . Hypotension 08/28/2011  . S/P total hip arthroplasty 08/28/2011  . CAD (coronary artery disease) 08/28/2011  . Hypothyroid 08/28/2011  . UTI (lower urinary tract infection) 08/28/2011  . Thrombocytopenia 08/28/2011    Past Medical History  Diagnosis Date  . GI bleed 2007  . Hypertension   . TIA (transient ischemic attack)   . Hypothyroidism   . Abdominal aortic aneurysm   . CAD (coronary artery disease)   . SSS (sick sinus syndrome) 10/05/2007    Medtronic Adapta  . Myocardial infarction   . Atrial fibrillation   . COPD  (chronic obstructive pulmonary disease)   . Asthma     hx  . Anemia     hx  . Blood transfusion   . UTI (lower urinary tract infection)     hx  . Renal failure, chronic   . H/O hiatal hernia   . GERD (gastroesophageal reflux disease)   . Stroke   . Osteoporosis   . Blood transfusion without reported diagnosis     Past Surgical History  Procedure Laterality Date  . Pacemaker insertion  10/05/2007    Medtronic Adapta  . Coronary stent placement      x9  . Cardiac catheterization    . Fracture surgery      femur fx, /w ORIF into HIP- 2008  . Total hip arthroplasty      planned for 08/27/2011 Left  . Total hip arthroplasty  08/27/2011    Procedure: TOTAL HIP ARTHROPLASTY;  Surgeon: Kerin Salen, MD;  Location: [facility];  Service: Orthopedics;  Laterality: Right;  . US echocardiography  08/23/2011    EF 50%,LA mod. dilated,mild to mod. mitral annular ca+,mod. MR,mild to mod TR,mild to mod. PH,trace AI.  [name] Nm myocar perf wall motion  10/08/2010    mild apical anterior ischemia    History  Substance Use Topics  . Smoking status: Former Smoker    Types: Cigarettes    Quit date: 07/16/2013  . Smokeless tobacco: Never Used  Comment: quit 40 yrs  . Alcohol Use: No    Family History  Problem Relation Age of Onset  . Emphysema Father   . Heart disease Mother   . Cancer Brother     Allergies  Allergen Reactions  . Nubain [Nalbuphine Hcl] Anaphylaxis  . Statins     Leg weakness  . Iodine Rash    Medication list has been reviewed and updated.  Current Outpatient Prescriptions on File Prior to Visit  Medication Sig Dispense Refill  . amiodarone (PACERONE) 200 MG tablet TAKE 1 TABLET BY MOUTH DAILY. 30 tablet 6  . amoxicillin-clavulanate (AUGMENTIN) 875-125 MG per tablet Take 1 tablet by mouth 2 (two) times daily. 20 tablet 0  . Biotin 1000 MCG tablet Take 1,000 mcg by mouth daily.    . Calcium Carb-Cholecalciferol (CALCIUM 600 + D PO) Take 1 tablet by mouth daily.    .  clopidogrel (PLAVIX) 75 MG tablet Take 1 tablet (75 mg total) by mouth daily with breakfast. 90 tablet 10  . cycloSPORINE (RESTASIS) 0.05 % ophthalmic emulsion Place 1 drop into both eyes 2 (two) times daily.    . ferrous sulfate 325 (65 FE) MG tablet Take 325 mg by mouth daily with breakfast.     . furosemide (LASIX) 20 MG tablet Take 20 mg by mouth daily.     . hydrocortisone (ANUSOL-HC) 2.5 % rectal cream Apply rectally 2 times daily 30 g 1  . hydrocortisone (ANUSOL-HC) 25 MG suppository Place 1 suppository (25 mg total) rectally at bedtime. 12 suppository 2  . levothyroxine (SYNTHROID, LEVOTHROID) 125 MCG tablet TAKE 1 TABLET BY MOUTH EVERY DAY 30 tablet 0  . metoprolol succinate (TOPROL-XL) 25 MG 24 hr tablet Take 12.5 mg by mouth daily.    . potassium chloride SA (K-DUR,KLOR-CON) 20 MEQ tablet Take 20 mEq by mouth daily.     . ranitidine (ZANTAC) 300 MG tablet Take 300 mg by mouth at bedtime.     . vitamin B-12 (CYANOCOBALAMIN) 1000 MCG tablet Take 1,000 mcg by mouth daily.     No current facility-administered medications on file prior to visit.    Review of Systems:  As per HPI- otherwise negative.  Physical Examination: Filed Vitals:   03/25/14 1349  BP: 116/76  Pulse: 86  Temp: 97.6 F (36.4 C)  Resp: 16   Filed Vitals:   03/25/14 1349  Height: 5' 2.5" (1.588 m)  Weight: 113 lb (51.256 kg)   Body mass index is 20.33 kg/(m^2). Ideal Body Weight: Weight in (lb) to have BMI = 25: 138.6  GEN: WDWN, NAD, Non-toxic, A & O x 3, frail elderly lady, looks well HEENT: Atraumatic, Normocephalic. Neck supple. No masses, No LAD. Ears and Nose: No external deformity. CV: RRR, No M/G/R. No JVD. No thrill. No extra heart sounds. PULM: CTA B, no wheezes, crackles, rhonchi. No retractions. No resp. distress. No accessory muscle use. EXTR: No c/c/e NEURO Normal gait.  PSYCH: Normally interactive. Conversant. Not depressed or anxious appearing.  Calm demeanor.  Shallow ulcer over  left lateral malleolus does look better- no redness, soft eschar over the entire wound (which is just under the diameter of a dime).  No swelling. She has dusky appearance of the left foot (stable) but normal cap refill and sensation. Redressed wound for her today, gave a bit of silvadene to use at home  Assessment and Plan: Ulcer of ankle, unspecified laterality, limited to breakdown of skin - Plan: Ambulatory referral to Podiatry  Improving.  She will continue to keep it covered and use silvadene daily, finish out augmentin.  Will refer back to her podiatrist. They will follow- up if her ulcer is not continuing to improve   Signed Lamar Blinks, MD

## 2014-03-25 NOTE — Patient Instructions (Signed)
Continue to take your antibiotic and use the silvadene (white cream) once a day on the ulcer.  You should store this in the refrigerator I will set you up to see your podiatrist again and we will give Pamala Hurry a call If the area is not completley healed in 1-2 weeks please call or come back and see me

## 2014-03-26 ENCOUNTER — Encounter (HOSPITAL_COMMUNITY): Payer: Medicare Other

## 2014-03-29 ENCOUNTER — Encounter: Payer: Self-pay | Admitting: Cardiology

## 2014-04-04 ENCOUNTER — Ambulatory Visit: Payer: Medicare Other

## 2014-04-04 ENCOUNTER — Encounter: Payer: Self-pay | Admitting: Cardiovascular Disease

## 2014-04-19 DIAGNOSIS — H04123 Dry eye syndrome of bilateral lacrimal glands: Secondary | ICD-10-CM | POA: Diagnosis not present

## 2014-04-19 DIAGNOSIS — H3531 Nonexudative age-related macular degeneration: Secondary | ICD-10-CM | POA: Diagnosis not present

## 2014-04-19 DIAGNOSIS — H02055 Trichiasis without entropian left lower eyelid: Secondary | ICD-10-CM | POA: Diagnosis not present

## 2014-04-19 DIAGNOSIS — H02052 Trichiasis without entropian right lower eyelid: Secondary | ICD-10-CM | POA: Diagnosis not present

## 2014-04-25 ENCOUNTER — Ambulatory Visit (INDEPENDENT_AMBULATORY_CARE_PROVIDER_SITE_OTHER): Payer: Medicare Other

## 2014-04-25 VITALS — BP 99/86 | HR 67 | Resp 12

## 2014-04-25 DIAGNOSIS — M2040 Other hammer toe(s) (acquired), unspecified foot: Secondary | ICD-10-CM | POA: Diagnosis not present

## 2014-04-25 DIAGNOSIS — Q828 Other specified congenital malformations of skin: Secondary | ICD-10-CM | POA: Diagnosis not present

## 2014-04-25 DIAGNOSIS — B351 Tinea unguium: Secondary | ICD-10-CM | POA: Diagnosis not present

## 2014-04-25 DIAGNOSIS — M79676 Pain in unspecified toe(s): Secondary | ICD-10-CM

## 2014-04-25 DIAGNOSIS — I739 Peripheral vascular disease, unspecified: Secondary | ICD-10-CM

## 2014-04-25 DIAGNOSIS — L97301 Non-pressure chronic ulcer of unspecified ankle limited to breakdown of skin: Secondary | ICD-10-CM | POA: Diagnosis not present

## 2014-04-25 NOTE — Patient Instructions (Signed)
ANTIBACTERIAL SOAP INSTRUCTIONS  THE DAY AFTER PROCEDURE  Please follow the instructions your doctor has marked.   Shower as usual. Before getting out, place a drop of antibacterial liquid soap (Dial) on a wet, clean washcloth.  Gently wipe washcloth over affected area.  Afterward, rinse the area with warm water.  Blot the area dry with a soft cloth and cover with antibiotic ointment (neosporin, polysporin, bacitracin) and band aid or gauze and tape  Place 3-4 drops of antibacterial liquid soap in a quart of warm tap water.  Submerge foot into water for 20 minutes.  If bandage was applied after your procedure, leave on to allow for easy lift off, then remove and continue with soak for the remaining time. Wash skin or any wound sites with soap and water using normal bathing or hygiene. Maintain anabolic ointment or Silvadene cream and gauze dressing to the superficial ulcer sites of the ankles bilateral.

## 2014-04-25 NOTE — Progress Notes (Signed)
   Subjective:    Patient ID: [name], female    DOB: 1917/08/01, 81 y.o.   MRN: [id number]  HPI  PT STATED B/L LATERAL SIDE OF THE ANKLE HAVE AN OPEN SORE AND BEEN PAINFUL FOR 2 MONTHS.  BOTH ANKLE ARE LOOKING MUCH BETTER BUT GET AGGRAVATED BY PRESSURE. DR? [facility], COPLAN PRESCRIBE ANTIBIOTIC AND IT HELP SOME.  Review of Systems  Constitutional: Positive for unexpected weight change.  HENT: Positive for hearing loss, sinus pressure and sneezing.   Eyes: Positive for redness.  Endocrine: Positive for cold intolerance.  Genitourinary: Positive for frequency.  Musculoskeletal: Positive for gait problem.  Skin: Positive for wound.  Neurological: Positive for weakness.  Hematological: Bruises/bleeds easily.  All other systems reviewed and are negative.      Objective:   Physical Exam 81 year old white female process this time with family member for a complaint of bilateral superficial ankle ulcers down to dermal level. Lateral malleoli bilateral show some excoriation although they. Healing they been applying apparently what appears to be Silvadene cream and Betadine ointment however patient has an allergy to iodine was advised to discontinue Betadine. Objective findings as follows vascular status is nonpalpable DP or PT 0 over 4 bilateral temperature cool Refill time 5-6 seconds all digits mild varicosities noted epicritic and proprioceptive sensations grossly diminished on [employer] all the patient is hyperesthesia on palpation touch the nails thick brittle criptotic incurvated and friable 1 through 5 bilateral there is distal clavus due to adductovarus rotation and contracture of the fourth toe right foot keratotic lesion is debrided no open wound or ulcer noted there is also keratoses medial surface fifth digit left foot due to hammertoe deformity. No secondary infections no purulence no discharge or drainage noted at this time. Again mild digital contractures are  identified patient is for the most part bedridden and was with a walker with assistance is wearing a pair of crocs and comfortable loosefitting socks. Does have a history of gastric compromise his stent placement on right leg and multiple Dopplers in the past and continues to monitor with Doppler studies regularly with [facility] vascular. Also set a pacemaker placed 3 years ago and a right hip replacement as well. 3 years ago       Assessment & Plan:  Assessment patient does have history of PAD/PVD with complications as decreased absent pulses noted there is history of lateral ankle lateral malleolar ulceration down to dermal skin level only epidermal dermal junction. These are cleansed and Silvadene and gauze dressing and paper tape are applied we'll continue with light gauze dressing and Silvadene dressing changes as needed as far as nails nails thick criptotic incurvated brittle and friable 1 through 5 bilateral debrided at this time and distal clavus distal fourth digit right foot is debrided without lead or no signs of infection no discharge or drainage noted maintain accommodative shoes did recommend wearing booties or lambswool type booties to protect the feet and ankles and ulcer sites. Maintain daily cleansing with soap and water dressing changes needed reevaluate possible reassessment 3 months for continued palliative care  Harriet Masson DPM

## 2014-06-16 ENCOUNTER — Ambulatory Visit (INDEPENDENT_AMBULATORY_CARE_PROVIDER_SITE_OTHER): Payer: Medicare Other | Admitting: Emergency Medicine

## 2014-06-16 ENCOUNTER — Ambulatory Visit (INDEPENDENT_AMBULATORY_CARE_PROVIDER_SITE_OTHER): Payer: Medicare Other

## 2014-06-16 VITALS — BP 134/78 | HR 75 | Temp 97.4°F | Resp 20 | Ht 62.0 in | Wt 109.5 lb

## 2014-06-16 DIAGNOSIS — S4291XA Fracture of right shoulder girdle, part unspecified, initial encounter for closed fracture: Secondary | ICD-10-CM

## 2014-06-16 DIAGNOSIS — T07XXXA Unspecified multiple injuries, initial encounter: Secondary | ICD-10-CM

## 2014-06-16 DIAGNOSIS — Z23 Encounter for immunization: Secondary | ICD-10-CM | POA: Diagnosis not present

## 2014-06-16 DIAGNOSIS — T148 Other injury of unspecified body region: Secondary | ICD-10-CM | POA: Diagnosis not present

## 2014-06-16 NOTE — Progress Notes (Signed)
[facility] [address], [place] [address] [phone]  Date:  06/16/2014   Name:  [name]   DOB:  February 22, 1918   MRN:  [id number]  PCP:  Florina Ou, MD    Chief Complaint: Fall   History of Present Illness:  [name] is a 81 y.o. very pleasant female patient who presents with the following:  [place] last night when her feet got "tangled". Has abrasion on left arm and knee Pain in right shoulder with decreased mobility Occurred last night. Uncertain last TD. Denies other complaints. No improvement with over the counter medications or other home remedies.  Denies other complaint or health concern today.   Patient Active Problem List   Diagnosis Date Noted  . Rectal bleeding 07/16/2013  . Hyperlipidemia, statin intolerance 04/30/2013  . Paroxysmal atrial fibrillation 04/30/2013  . Pacemaker - dual chamber Medtronic 2009 04/30/2013  . AAA (abdominal aortic aneurysm) 04/30/2013  . Bilateral carotid artery stenosis 04/30/2013  . PAD (peripheral artery disease) 04/30/2013  . Constipation 08/31/2011  . Hypotension 08/28/2011  . S/P total hip arthroplasty 08/28/2011  . CAD (coronary artery disease) 08/28/2011  . Hypothyroid 08/28/2011  . UTI (lower urinary tract infection) 08/28/2011  . Thrombocytopenia 08/28/2011    Past Medical History  Diagnosis Date  . GI bleed 2007  . Hypertension   . TIA (transient ischemic attack)   . Hypothyroidism   . Abdominal aortic aneurysm   . CAD (coronary artery disease)   . SSS (sick sinus syndrome) 10/05/2007    Medtronic Adapta  . Myocardial infarction   . Atrial fibrillation   . COPD (chronic obstructive pulmonary disease)   . Asthma     hx  . Anemia     hx  . Blood transfusion   . UTI (lower urinary tract infection)     hx  . Renal failure, chronic   . H/O hiatal hernia   . GERD (gastroesophageal reflux disease)   . Stroke   . Osteoporosis   . Blood transfusion without reported diagnosis      Past Surgical History  Procedure Laterality Date  . Pacemaker insertion  10/05/2007    Medtronic Adapta  . Coronary stent placement      x9  . Cardiac catheterization    . Fracture surgery      femur fx, /w ORIF into HIP- 2008  . Total hip arthroplasty      planned for 08/27/2011 Left  . Total hip arthroplasty  08/27/2011    Procedure: TOTAL HIP ARTHROPLASTY;  Surgeon: Kerin Salen, MD;  Location: [facility];  Service: Orthopedics;  Laterality: Right;  . US echocardiography  08/23/2011    EF 50%,LA mod. dilated,mild to mod. mitral annular ca+,mod. MR,mild to mod TR,mild to mod. PH,trace AI.  [name] Nm myocar perf wall motion  10/08/2010    mild apical anterior ischemia    History  Substance Use Topics  . Smoking status: Former Smoker    Types: Cigarettes    Quit date: 07/16/2013  . Smokeless tobacco: Never Used     Comment: quit 40 yrs  . Alcohol Use: No    Family History  Problem Relation Age of Onset  . Emphysema Father   . Heart disease Mother   . Cancer Brother     Allergies  Allergen Reactions  . Nubain [Nalbuphine Hcl] Anaphylaxis  . Statins     Leg weakness  . Iodine Rash    Medication list  has been reviewed and updated.  Current Outpatient Prescriptions on File Prior to Visit  Medication Sig Dispense Refill  . amiodarone (PACERONE) 200 MG tablet TAKE 1 TABLET BY MOUTH DAILY. 30 tablet 6  . amoxicillin-clavulanate (AUGMENTIN) 875-125 MG per tablet Take 1 tablet by mouth 2 (two) times daily. 20 tablet 0  . Biotin 1000 MCG tablet Take 1,000 mcg by mouth daily.    . Calcium Carb-Cholecalciferol (CALCIUM 600 + D PO) Take 1 tablet by mouth daily.    . clopidogrel (PLAVIX) 75 MG tablet Take 1 tablet (75 mg total) by mouth daily with breakfast. 90 tablet 10  . cycloSPORINE (RESTASIS) 0.05 % ophthalmic emulsion Place 1 drop into both eyes 2 (two) times daily.    . ferrous sulfate 325 (65 FE) MG tablet Take 325 mg by mouth daily with breakfast.     . furosemide (LASIX) 20  MG tablet Take 20 mg by mouth daily.     . hydrocortisone (ANUSOL-HC) 2.5 % rectal cream Apply rectally 2 times daily 30 g 1  . hydrocortisone (ANUSOL-HC) 25 MG suppository Place 1 suppository (25 mg total) rectally at bedtime. 12 suppository 2  . levothyroxine (SYNTHROID, LEVOTHROID) 125 MCG tablet TAKE 1 TABLET BY MOUTH EVERY DAY 30 tablet 0  . metoprolol succinate (TOPROL-XL) 25 MG 24 hr tablet Take 12.5 mg by mouth daily.    . potassium chloride SA (K-DUR,KLOR-CON) 20 MEQ tablet Take 20 mEq by mouth daily.     . ranitidine (ZANTAC) 300 MG tablet Take 300 mg by mouth at bedtime.     . vitamin B-12 (CYANOCOBALAMIN) 1000 MCG tablet Take 1,000 mcg by mouth daily.     No current facility-administered medications on file prior to visit.    Review of Systems:  As per HPI, otherwise negative.    Physical Examination: Filed Vitals:   06/16/14 0932  BP: 134/78  Pulse: 75  Temp: 97.4 F (36.3 C)  Resp: 20   Filed Vitals:   06/16/14 0932  Height: 5\' 2"  (1.575 m)  Weight: 109 lb 8 oz (49.669 kg)   Body mass index is 20.02 kg/(m^2). Ideal Body Weight: Weight in (lb) to have BMI = 25: 136.4  GEN: WDWN, NAD, Non-toxic, A & O x 3 HEENT: Atraumatic, Normocephalic. Neck supple. No masses, No LAD. Ears and Nose: No external deformity. CV: RRR, No M/G/R. No JVD. No thrill. No extra heart sounds. PULM: CTA B, no wheezes, crackles, rhonchi. No retractions. No resp. distress. No accessory muscle use. ABD: S, NT, ND, +BS. No rebound. No HSM. EXTR: No c/c/e  Tender right shoulder with decreased ROM due pain NEURO Normal gait.   PSYCH: Normally interactive. Conversant. Not depressed or anxious appearing.  Calm demeanor.    Assessment and Plan: Fracture proximal humerus Ortho Sling  Signed,  Ellison Carwin, MD   [facility] reading (PRIMARY) by  Dr. Ouida Sills fracture humerus .

## 2014-06-16 NOTE — Patient Instructions (Signed)
Humerus Fracture, Treated with Immobilization  The humerus is the large bone in your upper arm. You have a broken (fractured) humerus. These fractures are easily diagnosed with X-rays.  TREATMENT   Simple fractures which will heal without disability are treated with simple immobilization. Immobilization means you will wear a cast, splint, or sling. You have a fracture which will do well with immobilization. The fracture will heal well simply by being held in a good position until it is stable enough to begin range of motion exercises. Do not take part in activities which would further injure your arm.   HOME CARE INSTRUCTIONS    Put ice on the injured area.   Put ice in a plastic bag.   Place a towel between your skin and the bag.   Leave the ice on for 15-20 minutes, 03-04 times a day.   If you have a cast:   Do not scratch the skin under the cast using sharp or pointed objects.   Check the skin around the cast every day. You may put lotion on any red or sore areas.   Keep your cast dry and clean.   If you have a splint:   Wear the splint as directed.   Keep your splint dry and clean.   You may loosen the elastic around the splint if your fingers become numb, tingle, or turn cold or blue.   If you have a sling:   Wear the sling as directed.   Do not put pressure on any part of your cast or splint until it is fully hardened.   Your cast or splint can be protected during bathing with a plastic bag. Do not lower the cast or splint into water.   Only take over-the-counter or prescription medicines for pain, discomfort, or fever as directed by your caregiver.   Do range of motion exercises as instructed by your caregiver.   Follow up as directed by your caregiver. This is very important in order to avoid permanent injury or disability and chronic pain.  SEEK IMMEDIATE MEDICAL CARE IF:    Your skin or nails in the injured arm turn blue or gray.   Your arm feels cold or numb.   You develop severe  pain in the injured arm.   You are having problems with the medicines you were given.  MAKE SURE YOU:    Understand these instructions.   Will watch your condition.   Will get help right away if you are not doing well or get worse.  Document Released: 06/07/2000 Document Revised: 05/24/2011 Document Reviewed: 04/15/2010  Exit[employer] Patient Information 2015 [employer]. This information is not intended to replace advice given to you by your health care provider. Make sure you discuss any questions you have with your health care provider.

## 2014-06-18 DIAGNOSIS — S42254A Nondisplaced fracture of greater tuberosity of right humerus, initial encounter for closed fracture: Secondary | ICD-10-CM | POA: Diagnosis not present

## 2014-06-19 ENCOUNTER — Emergency Department (HOSPITAL_COMMUNITY)
Admission: EM | Admit: 2014-06-19 | Discharge: 2014-06-20 | Disposition: A | Discharge status: Home / Self Care | Payer: Medicare Other | Attending: Emergency Medicine | Admitting: Emergency Medicine

## 2014-06-19 ENCOUNTER — Encounter (HOSPITAL_COMMUNITY): Payer: Self-pay | Admitting: Emergency Medicine

## 2014-06-19 ENCOUNTER — Emergency Department (HOSPITAL_COMMUNITY): Payer: Medicare Other

## 2014-06-19 ENCOUNTER — Ambulatory Visit (INDEPENDENT_AMBULATORY_CARE_PROVIDER_SITE_OTHER): Payer: Medicare Other | Admitting: Family Medicine

## 2014-06-19 VITALS — BP 127/79 | HR 84 | Temp 97.6°F | Resp 20

## 2014-06-19 DIAGNOSIS — Z792 Long term (current) use of antibiotics: Secondary | ICD-10-CM | POA: Insufficient documentation

## 2014-06-19 DIAGNOSIS — Z87440 Personal history of urinary (tract) infections: Secondary | ICD-10-CM | POA: Insufficient documentation

## 2014-06-19 DIAGNOSIS — M7981 Nontraumatic hematoma of soft tissue: Secondary | ICD-10-CM | POA: Insufficient documentation

## 2014-06-19 DIAGNOSIS — E860 Dehydration: Secondary | ICD-10-CM

## 2014-06-19 DIAGNOSIS — N189 Chronic kidney disease, unspecified: Secondary | ICD-10-CM | POA: Diagnosis not present

## 2014-06-19 DIAGNOSIS — Z8673 Personal history of transient ischemic attack (TIA), and cerebral infarction without residual deficits: Secondary | ICD-10-CM | POA: Insufficient documentation

## 2014-06-19 DIAGNOSIS — I252 Old myocardial infarction: Secondary | ICD-10-CM | POA: Diagnosis not present

## 2014-06-19 DIAGNOSIS — Z87891 Personal history of nicotine dependence: Secondary | ICD-10-CM | POA: Diagnosis not present

## 2014-06-19 DIAGNOSIS — I714 Abdominal aortic aneurysm, without rupture: Secondary | ICD-10-CM | POA: Diagnosis not present

## 2014-06-19 DIAGNOSIS — I4891 Unspecified atrial fibrillation: Secondary | ICD-10-CM | POA: Diagnosis not present

## 2014-06-19 DIAGNOSIS — R262 Difficulty in walking, not elsewhere classified: Secondary | ICD-10-CM | POA: Insufficient documentation

## 2014-06-19 DIAGNOSIS — I2510 Atherosclerotic heart disease of native coronary artery without angina pectoris: Secondary | ICD-10-CM | POA: Diagnosis not present

## 2014-06-19 DIAGNOSIS — M810 Age-related osteoporosis without current pathological fracture: Secondary | ICD-10-CM | POA: Insufficient documentation

## 2014-06-19 DIAGNOSIS — Z950 Presence of cardiac pacemaker: Secondary | ICD-10-CM | POA: Insufficient documentation

## 2014-06-19 DIAGNOSIS — Z9861 Coronary angioplasty status: Secondary | ICD-10-CM | POA: Insufficient documentation

## 2014-06-19 DIAGNOSIS — D649 Anemia, unspecified: Secondary | ICD-10-CM | POA: Insufficient documentation

## 2014-06-19 DIAGNOSIS — Z79899 Other long term (current) drug therapy: Secondary | ICD-10-CM | POA: Insufficient documentation

## 2014-06-19 DIAGNOSIS — Z7952 Long term (current) use of systemic steroids: Secondary | ICD-10-CM | POA: Diagnosis not present

## 2014-06-19 DIAGNOSIS — J449 Chronic obstructive pulmonary disease, unspecified: Secondary | ICD-10-CM | POA: Insufficient documentation

## 2014-06-19 DIAGNOSIS — Z9889 Other specified postprocedural states: Secondary | ICD-10-CM | POA: Insufficient documentation

## 2014-06-19 DIAGNOSIS — K219 Gastro-esophageal reflux disease without esophagitis: Secondary | ICD-10-CM | POA: Insufficient documentation

## 2014-06-19 DIAGNOSIS — I129 Hypertensive chronic kidney disease with stage 1 through stage 4 chronic kidney disease, or unspecified chronic kidney disease: Secondary | ICD-10-CM | POA: Insufficient documentation

## 2014-06-19 DIAGNOSIS — R531 Weakness: Secondary | ICD-10-CM

## 2014-06-19 DIAGNOSIS — S3993XA Unspecified injury of pelvis, initial encounter: Secondary | ICD-10-CM | POA: Diagnosis not present

## 2014-06-19 DIAGNOSIS — S4291XD Fracture of right shoulder girdle, part unspecified, subsequent encounter for fracture with routine healing: Secondary | ICD-10-CM

## 2014-06-19 DIAGNOSIS — E039 Hypothyroidism, unspecified: Secondary | ICD-10-CM | POA: Diagnosis not present

## 2014-06-19 DIAGNOSIS — S299XXA Unspecified injury of thorax, initial encounter: Secondary | ICD-10-CM | POA: Diagnosis not present

## 2014-06-19 DIAGNOSIS — S0990XA Unspecified injury of head, initial encounter: Secondary | ICD-10-CM | POA: Diagnosis not present

## 2014-06-19 LAB — CBC WITH DIFFERENTIAL/PLATELET
Basophils Absolute: 0 10*3/uL (ref 0.0–0.1)
Basophils Relative: 0 % (ref 0–1)
EOS ABS: 0 10*3/uL (ref 0.0–0.7)
Eosinophils Relative: 0 % (ref 0–5)
HCT: 37.2 % (ref 36.0–46.0)
Hemoglobin: 11.7 g/dL — ABNORMAL LOW (ref 12.0–15.0)
LYMPHS ABS: 1.7 10*3/uL (ref 0.7–4.0)
LYMPHS PCT: 25 % (ref 12–46)
MCH: 32.4 pg (ref 26.0–34.0)
MCHC: 31.5 g/dL (ref 30.0–36.0)
MCV: 103 fL — ABNORMAL HIGH (ref 78.0–100.0)
MONOS PCT: 17 % — AB (ref 3–12)
Monocytes Absolute: 1.1 10*3/uL — ABNORMAL HIGH (ref 0.1–1.0)
NEUTROS ABS: 3.8 10*3/uL (ref 1.7–7.7)
Neutrophils Relative %: 58 % (ref 43–77)
PLATELETS: 106 10*3/uL — AB (ref 150–400)
RBC: 3.61 MIL/uL — ABNORMAL LOW (ref 3.87–5.11)
RDW: 13.7 % (ref 11.5–15.5)
WBC: 6.6 10*3/uL (ref 4.0–10.5)

## 2014-06-19 LAB — POCT URINALYSIS DIPSTICK
Bilirubin, UA: NEGATIVE
Glucose, UA: NEGATIVE
Ketones, UA: NEGATIVE
Leukocytes, UA: NEGATIVE
Nitrite, UA: NEGATIVE
Spec Grav, UA: <=1.005
Urobilinogen, UA: 0.2
pH, UA: 5.5

## 2014-06-19 LAB — POCT UA - MICROSCOPIC ONLY
Casts, Ur, LPF, POC: NEGATIVE
Crystals, Ur, HPF, POC: NEGATIVE
Mucus, UA: NEGATIVE
Yeast, UA: NEGATIVE

## 2014-06-19 LAB — COMPREHENSIVE METABOLIC PANEL
ALT: 42 U/L — AB (ref 0–35)
AST: 59 U/L — ABNORMAL HIGH (ref 0–37)
Albumin: 3.1 g/dL — ABNORMAL LOW (ref 3.5–5.2)
Alkaline Phosphatase: 76 U/L (ref 39–117)
Anion gap: 10 (ref 5–15)
BUN: 22 mg/dL (ref 6–23)
CALCIUM: 8.7 mg/dL (ref 8.4–10.5)
CO2: 28 mmol/L (ref 19–32)
Chloride: 97 mmol/L (ref 96–112)
Creatinine, Ser: 1.48 mg/dL — ABNORMAL HIGH (ref 0.50–1.10)
GFR calc Af Amer: 33 mL/min — ABNORMAL LOW (ref >90)
GFR, EST NON AFRICAN AMERICAN: 29 mL/min — AB (ref >90)
Glucose, Bld: 101 mg/dL — ABNORMAL HIGH (ref 70–99)
Potassium: 5.1 mmol/L (ref 3.5–5.1)
Sodium: 135 mmol/L (ref 135–145)
Total Bilirubin: 0.7 mg/dL (ref 0.3–1.2)
Total Protein: 6.2 g/dL (ref 6.0–8.3)

## 2014-06-19 LAB — I-STAT TROPONIN, ED: TROPONIN I, POC: 0.01 ng/mL (ref 0.00–0.08)

## 2014-06-19 MED ORDER — SODIUM CHLORIDE 0.9 % IV BOLUS (SEPSIS)
1000.0000 mL | Freq: Once | INTRAVENOUS | Status: AC
  Administered 2014-06-19: 1000 mL via INTRAVENOUS

## 2014-06-19 NOTE — ED Notes (Signed)
No answer in waiting room 

## 2014-06-19 NOTE — ED Notes (Signed)
IV Team at the bedside. 

## 2014-06-19 NOTE — Progress Notes (Signed)
Chief Complaint:  Chief Complaint  Patient presents with  . Extremity Weakness    HPI: [name] is a 81 y.o. female who is here for 2-3 day history of generalized weakness. She had fallen on Saturday and had a right shoulder fracture that was nondisplaced. She was seen 3 days ago. She has been wobbly on her feet since.. She denies any hip pain. She has had no fevers or chills. She has urinary incontinence and history of UTI. She has had poor by mouth intake today. Her daughters are here and are concerned that she is dehydrated and wants to know why she is weak. Today she had one shake and 2 glasses of water. She does not have a history of CHF. Her last echo was in 2014 and she had a normal EF. She has had nonbloody diarrhea. Her family is worried because she has been losing weight for the last 1 month. Her last colonoscopy was in 2007, it did show that she had diverticulosis of the transverse colon. She had no polyps. She does have hemorrhoids. She also has a history of anemia. She has not been confused. He is able to walk very slowly with her cane. She called her daughter because she was unable to get up on her own out of the seat. She normally is able to transfer and get out of the seat by herself. Denies chest pain, shortness of breath, palpitations. She denies any headaches or confusion. He has a history of TIA/CVA and is on Plavix  Her PCP is Tammy Spear  BP Readings from Last 3 Encounters:  06/19/14 127/79  06/19/14 138/67  06/16/14 134/78    Wt Readings from Last 3 Encounters:  06/16/14 109 lb 8 oz (49.669 kg)  03/25/14 113 lb (51.256 kg)  03/13/14 116 lb 6.4 oz (52.799 kg)    Past Medical History  Diagnosis Date  . GI bleed 2007  . Hypertension   . TIA (transient ischemic attack)   . Hypothyroidism   . Abdominal aortic aneurysm   . CAD (coronary artery disease)   . SSS (sick sinus syndrome) 10/05/2007    Medtronic Adapta  . Myocardial infarction   . Atrial  fibrillation   . COPD (chronic obstructive pulmonary disease)   . Asthma     hx  . Anemia     hx  . Blood transfusion   . UTI (lower urinary tract infection)     hx  . Renal failure, chronic   . H/O hiatal hernia   . GERD (gastroesophageal reflux disease)   . Stroke   . Osteoporosis   . Blood transfusion without reported diagnosis    Past Surgical History  Procedure Laterality Date  . Pacemaker insertion  10/05/2007    Medtronic Adapta  . Coronary stent placement      x9  . Cardiac catheterization    . Fracture surgery      femur fx, /w ORIF into HIP- 2008  . Total hip arthroplasty      planned for 08/27/2011 Left  . Total hip arthroplasty  08/27/2011    Procedure: TOTAL HIP ARTHROPLASTY;  Surgeon: Kerin Salen, MD;  Location: [facility];  Service: Orthopedics;  Laterality: Right;  . US echocardiography  08/23/2011    EF 50%,LA mod. dilated,mild to mod. mitral annular ca+,mod. MR,mild to mod TR,mild to mod. PH,trace AI.  [name] Nm myocar perf wall motion  10/08/2010    mild apical anterior ischemia  History   Social History  . Marital Status: Widowed    Spouse Name: N/A  . Number of Children: 4  . Years of Education: N/A   Occupational History  . Retired    Social History Main Topics  . Smoking status: Former Smoker    Types: Cigarettes    Quit date: 07/16/2013  . Smokeless tobacco: Never Used     Comment: quit 40 yrs  . Alcohol Use: No  . Drug Use: No  . Sexual Activity: Yes    Birth Control/ Protection: Post-menopausal   Other Topics Concern  . None   Social History Narrative   Family History  Problem Relation Age of Onset  . Emphysema Father   . Heart disease Mother   . Cancer Brother    Allergies  Allergen Reactions  . Nubain [Nalbuphine Hcl] Anaphylaxis  . Statins     Leg weakness  . Iodine Rash   Prior to Admission medications   Medication Sig Start Date End Date Taking? Authorizing Provider  amiodarone (PACERONE) 200 MG tablet TAKE 1 TABLET BY  MOUTH DAILY. 01/14/14  Yes Mihai Croitoru, MD  Biotin 1000 MCG tablet Take 1,000 mcg by mouth daily.   Yes Historical Provider, MD  Calcium Carb-Cholecalciferol (CALCIUM 600 + D PO) Take 1 tablet by mouth daily.   Yes Historical Provider, MD  clopidogrel (PLAVIX) 75 MG tablet Take 1 tablet (75 mg total) by mouth daily with breakfast. 07/09/13  Yes Mihai Croitoru, MD  cycloSPORINE (RESTASIS) 0.05 % ophthalmic emulsion Place 1 drop into both eyes 2 (two) times daily.   Yes Historical Provider, MD  ferrous sulfate 325 (65 FE) MG tablet Take 325 mg by mouth daily with breakfast.    Yes Historical Provider, MD  furosemide (LASIX) 20 MG tablet Take 20 mg by mouth daily.    Yes Historical Provider, MD  hydrocortisone (ANUSOL-HC) 2.5 % rectal cream Apply rectally 2 times daily 07/08/13  Yes [name], MD  hydrocortisone (ANUSOL-HC) 25 MG suppository Place 1 suppository (25 mg total) rectally at bedtime. 07/16/59  Yes Leighton Ruff, MD  levothyroxine (SYNTHROID, LEVOTHROID) 125 MCG tablet TAKE 1 TABLET BY MOUTH EVERY DAY 10/30/13  Yes Lorretta Harp, MD  metoprolol succinate (TOPROL-XL) 25 MG 24 hr tablet Take 12.5 mg by mouth daily.   Yes Historical Provider, MD  potassium chloride SA (K-DUR,KLOR-CON) 20 MEQ tablet Take 20 mEq by mouth daily.    Yes Historical Provider, MD  ranitidine (ZANTAC) 300 MG tablet Take 300 mg by mouth at bedtime.    Yes Historical Provider, MD  vitamin B-12 (CYANOCOBALAMIN) 1000 MCG tablet Take 1,000 mcg by mouth daily.   Yes Historical Provider, MD     ROS: The patient denies fevers, chills, night sweats, unintentional weight loss, chest pain, palpitations, wheezing, dyspnea on exertion, nausea, vomiting, abdominal pain, dysuria, hematuria, melena, +for weakness and urinary incontinence, urinary incontinence is chronic.  All other systems have been reviewed and were otherwise negative with the exception of those mentioned in the HPI and as above.    PHYSICAL EXAM: Filed  Vitals:   06/19/14 1824  BP: 127/79  Pulse: 84  Temp: 97.6 F (36.4 C)  Resp: 20   There were no vitals filed for this visit. There is no weight on file to calculate BMI.  General: Alert, no acute distress, deconditioned elderly white female HEENT:  Normocephalic, atraumatic, oropharynx patent. EOMI, PERRLA Cardiovascular:  Regular rate and rhythm, no rubs murmurs or gallops.  No Carotid  bruits, radial pulse intact. No pedal edema.  Respiratory: Clear to auscultation bilaterally.  No wheezes, rales, or rhonchi.  No cyanosis, no use of accessory musculature GI: No organomegaly, abdomen is soft and non-tender, positive bowel sounds.  No masses. Skin: No rashes. Neurologic: Facial musculature symmetric. Psychiatric: Patient is appropriate throughout our interaction. Lymphatic: No cervical lymphadenopathy Musculoskeletal: Gait not intact, patient is weak and is in a wheelchair.   LABS: Results for orders placed or performed in visit on 06/19/14  POCT UA - Microscopic Only  Result Value Ref Range   WBC, Ur, HPF, POC 2-4    RBC, urine, microscopic 1-3    Bacteria, U Microscopic trace    Mucus, UA neg    Epithelial cells, urine per micros 2-4    Crystals, Ur, HPF, POC neg    Casts, Ur, LPF, POC neg    Yeast, UA neg   POCT urinalysis dipstick  Result Value Ref Range   Color, UA yelllow    Clarity, UA clear    Glucose, UA neg    Bilirubin, UA neg    Ketones, UA neg    Spec Grav, UA <=1.005    Blood, UA small    pH, UA 5.5    Protein, UA trace    Urobilinogen, UA 0.2    Nitrite, UA neg    Leukocytes, UA Negative      EKG/XRAY:   Primary read interpreted by Dr. Marin Comment at [facility].   ASSESSMENT/PLAN: Encounter Diagnoses  Name Primary?  . Weakness Yes  . Dehydration   . Fracture of right shoulder, with routine healing, subsequent encounter    Pleasant 81 year old female who is here for generalized weakness and weakness in the extremities with worsening symptoms in the last 3  days. She suffered from a right shoulder fracture recently. She is in a sling. She fell when she had her shoulder fracture. She has also had a one-month history of poor eating and weight loss. She has been not eating or drinking much today at all. She is dehydrated.  We attempted to get blood work but was unable to give her IV fluids or draw any bloods due to poor IV access. Will send to ER for further evaluation at request of family and is recommended since we are not able to help her here due to IV access issues. ER notified, patient will arrive by private vehicle.  Gross sideeffects, risk and benefits, and alternatives of medications d/w patient. Patient is aware that all medications have potential sideeffects and we are unable to predict every sideeffect or drug-drug interaction that may occur.  Suhaas Agena, [place], DO 06/19/2014 8:17 PM

## 2014-06-19 NOTE — ED Notes (Signed)
Family reports patient is not eating and drinking. Patient reports feeling weak and fatigued.

## 2014-06-19 NOTE — ED Notes (Signed)
Resident at the bedside

## 2014-06-19 NOTE — Discharge Instructions (Signed)

## 2014-06-19 NOTE — ED Provider Notes (Signed)
CSN: [id number]     Arrival date & time 06/19/14  2007 History   First MD Initiated Contact with Patient 06/19/14 2031     Chief Complaint  Patient presents with  . Weakness     (Consider location/radiation/quality/duration/timing/severity/associated sxs/prior Treatment) HPI  81 year old female with past medical history previous GI bleed, hypothyroid, coronary disease, COPD, previous UTIs, who suffered a mechanical fall on Saturday because of general weakness, no syncope/presyncope.  She presented to an urgent care and was found to have a right-sided humerus fracture as well as multiple skin tears seen by orthopedic surg for the humerus frx and being managed with a sling. She comes in today with her children because of generalized weakness which she's been having for last couple days that but got worse today. She was having difficulty ambulating today because of the weakness. No fever chills no cough no chest pain no abdominal pain, no shortness of breath.  Her daughters feel that she is dehydrated because they note she hasn't been eating as well lately.  She has a hx of esophageal strictures but she is still able to tolerate solids but in small bites.  She presented to urgent care today and was sent to the ED  Past Medical History  Diagnosis Date  . GI bleed 2007  . Hypertension   . TIA (transient ischemic attack)   . Hypothyroidism   . Abdominal aortic aneurysm   . CAD (coronary artery disease)   . SSS (sick sinus syndrome) 10/05/2007    Medtronic Adapta  . Myocardial infarction   . Atrial fibrillation   . COPD (chronic obstructive pulmonary disease)   . Asthma     hx  . Anemia     hx  . Blood transfusion   . UTI (lower urinary tract infection)     hx  . Renal failure, chronic   . H/O hiatal hernia   . GERD (gastroesophageal reflux disease)   . Stroke   . Osteoporosis   . Blood transfusion without reported diagnosis    Past Surgical History  Procedure Laterality Date  .  Pacemaker insertion  10/05/2007    Medtronic Adapta  . Coronary stent placement      x9  . Cardiac catheterization    . Fracture surgery      femur fx, /w ORIF into HIP- 2008  . Total hip arthroplasty      planned for 08/27/2011 Left  . Total hip arthroplasty  08/27/2011    Procedure: TOTAL HIP ARTHROPLASTY;  Surgeon: Kerin Salen, MD;  Location: [facility];  Service: Orthopedics;  Laterality: Right;  . US echocardiography  08/23/2011    EF 50%,LA mod. dilated,mild to mod. mitral annular ca+,mod. MR,mild to mod TR,mild to mod. PH,trace AI.  [name] Nm myocar perf wall motion  10/08/2010    mild apical anterior ischemia   Family History  Problem Relation Age of Onset  . Emphysema Father   . Heart disease Mother   . Cancer Brother    History  Substance Use Topics  . Smoking status: Former Smoker    Types: Cigarettes    Quit date: 07/16/2013  . Smokeless tobacco: Never Used     Comment: quit 40 yrs  . Alcohol Use: No   OB History    No data available     Review of Systems  Constitutional: Negative for fever and chills.  HENT: Negative for nosebleeds.   Eyes: Negative for visual disturbance.  Respiratory: Negative for cough and  shortness of breath.   Cardiovascular: Negative for chest pain.  Gastrointestinal: Negative for nausea, vomiting, abdominal pain, diarrhea and constipation.  Genitourinary: Negative for dysuria.  Skin: Negative for rash.  Neurological: Positive for weakness.  All other systems reviewed and are negative.     Allergies  Nubain; Statins; and Iodine  Home Medications   Prior to Admission medications   Medication Sig Start Date End Date Taking? Authorizing Provider  amiodarone (PACERONE) 200 MG tablet TAKE 1 TABLET BY MOUTH DAILY. 01/14/14   Mihai Croitoru, MD  Biotin 1000 MCG tablet Take 1,000 mcg by mouth daily.    Historical Provider, MD  Calcium Carb-Cholecalciferol (CALCIUM 600 + D PO) Take 1 tablet by mouth daily.    Historical Provider, MD   clopidogrel (PLAVIX) 75 MG tablet Take 1 tablet (75 mg total) by mouth daily with breakfast. 07/09/13   Mihai Croitoru, MD  cycloSPORINE (RESTASIS) 0.05 % ophthalmic emulsion Place 1 drop into both eyes 2 (two) times daily.    Historical Provider, MD  ferrous sulfate 325 (65 FE) MG tablet Take 325 mg by mouth daily with breakfast.     Historical Provider, MD  furosemide (LASIX) 20 MG tablet Take 20 mg by mouth daily.     Historical Provider, MD  hydrocortisone (ANUSOL-HC) 2.5 % rectal cream Apply rectally 2 times daily 07/08/13   [name], MD  hydrocortisone (ANUSOL-HC) 25 MG suppository Place 1 suppository (25 mg total) rectally at bedtime. 07/16/25   Leighton Ruff, MD  levothyroxine (SYNTHROID, LEVOTHROID) 125 MCG tablet TAKE 1 TABLET BY MOUTH EVERY DAY 10/30/13   Lorretta Harp, MD  metoprolol succinate (TOPROL-XL) 25 MG 24 hr tablet Take 12.5 mg by mouth daily.    Historical Provider, MD  potassium chloride SA (K-DUR,KLOR-CON) 20 MEQ tablet Take 20 mEq by mouth daily.     Historical Provider, MD  ranitidine (ZANTAC) 300 MG tablet Take 300 mg by mouth at bedtime.     Historical Provider, MD  vitamin B-12 (CYANOCOBALAMIN) 1000 MCG tablet Take 1,000 mcg by mouth daily.    Historical Provider, MD   BP 146/62 mmHg  Pulse 74  Temp(Src) 98.9 F (37.2 C) (Oral)  Resp 23  SpO2 97% Physical Exam  Constitutional: She is oriented to person, place, and time. No distress.  HENT:  Head: Normocephalic and atraumatic.  Eyes: EOM are normal. Pupils are equal, round, and reactive to light.  Neck: Normal range of motion. Neck supple.  Cardiovascular: Normal rate and intact distal pulses.   Pulmonary/Chest: No respiratory distress. She exhibits no tenderness.  Abdominal: Soft. There is no tenderness. There is no rebound and no guarding.  Musculoskeletal:  ttp over the right humerus and some ecchymosis of the skin.  Normal sensation/motor function in the right hand with good r radial pulse   Neurological: She is alert and oriented to person, place, and time.  Skin: No rash noted. She is not diaphoretic.  Psychiatric: She has a normal mood and affect.  Cranial Nerves  II:  Visual fields Intact, pupillary equal round and reactive to light III, IV, VI: Full eye movement without nystagmus  V Facial Sensation: Normal. No weakness of masticatory muscles  VII: No facial weakness or asymmetry  VIII Auditory Acuity: Grossly normal  IX/X: The uvula is midline; the palate elevates symmetrically  XI: Normal sternocleidomastoid and trapezius strength  XII: The tongue is midline. No atrophy or fasciculations.  Motor System: Muscle Strength: 5/5 and symmetric in the upper and lower extremities.  No pronation or drift.  Muscle Tone: Tone and muscle bulk are normal in the upper and lower extremities.   Coordination: Intact finger-to-nose Sensation: Intact to light touch.  Gait: Routine and tandem gait are normal     ED Course  Procedures (including critical care time) Labs Review Labs Reviewed  CBC WITH DIFFERENTIAL/PLATELET  COMPREHENSIVE METABOLIC PANEL  URINALYSIS, ROUTINE W REFLEX MICROSCOPIC  I-STAT TROPOININ, ED    Imaging Review No results found.   EKG Interpretation None      MDM   Final diagnoses:  None    81 year old female with past medical history previous GI bleed, hypothyroid, coronary disease, COPD, previous UTIs, who suffered a mechanical fall on Saturday because of general weakness, no syncope/presyncope.  Today she felt more generalized weakness so her daughters brought her in.  She has no infectious sx at home.  No headache.  No other sx  Exam as above.  Neuro exam WNL.  The rest of her exam is unremarkable other than her right humerus frx.  UA done today at urgent care was neg leuks, neg nitrite.  Will obtain basic labs, head CT, CXR, PXR, trop to look for potential causes of weakness  Basic labs with hgb of 11.7, patient denies any blood in her  stool/black stools.  Trop neg.  cmp WNL.  CT head neg, PXR neg, CXR neg.  Have given 1L NS.  After fluid bolus, patient feeling much better, able to ambulate around the whole department without difficulty.  Given improvement with fluids, feel mild dehydration likely was the cause of her sx.  Feel safe for d/c home and patient is on board with this plan.  I have discussed the results, Dx and Tx plan with the patient. They expressed understanding and agree with the plan and were told to return to ED with any worsening of condition or concern.    Disposition: Discharge  Condition: Good  Discharge Medication List as of 06/19/2014 11:55 PM      Follow Up: Florina Ou, MD [place] [id number] [phone]  In 2 days    Pt seen in conjunction with Dr. Alecia Lemming, MD 06/20/14 [place], MD 06/20/14 1714

## 2014-06-19 NOTE — ED Notes (Addendum)
Pt. reports progressing generalized weakness/fatigue onset this week , fell last Saturday and sustained right humerus fracture and multiple skin tear seen by her orthopedic MD yesterday . Family reported that pt. Is dehydrated .

## 2014-06-21 LAB — URINE CULTURE: Colony Count: 55000

## 2014-06-23 ENCOUNTER — Ambulatory Visit (INDEPENDENT_AMBULATORY_CARE_PROVIDER_SITE_OTHER): Payer: Medicare Other | Admitting: Physician Assistant

## 2014-06-23 VITALS — BP 117/75 | HR 90 | Temp 97.8°F | Resp 24 | Ht 62.0 in | Wt 114.1 lb

## 2014-06-23 DIAGNOSIS — S4291XD Fracture of right shoulder girdle, part unspecified, subsequent encounter for fracture with routine healing: Secondary | ICD-10-CM

## 2014-06-23 DIAGNOSIS — E860 Dehydration: Secondary | ICD-10-CM

## 2014-06-23 DIAGNOSIS — Z6820 Body mass index (BMI) 20.0-20.9, adult: Secondary | ICD-10-CM

## 2014-06-23 DIAGNOSIS — H9193 Unspecified hearing loss, bilateral: Secondary | ICD-10-CM

## 2014-06-23 DIAGNOSIS — H9190 Unspecified hearing loss, unspecified ear: Secondary | ICD-10-CM | POA: Insufficient documentation

## 2014-06-23 NOTE — Patient Instructions (Signed)
Continue healthy eating and staying hydrated.  Schedule a follow-up visit at Dr. Greta Doom office, or at [facility].

## 2014-06-23 NOTE — Progress Notes (Signed)
Patient ID: [name], female    DOB: February 22, 1918, 81 y.o.   MRN: [id number]  PCP: Florina Ou, MD  Subjective:   Chief Complaint  Patient presents with  . Follow-up    HPI  Presents for follow-up after ED visit on 06/19/14 for dehydration.  For the past month, she's been complaining of feeling weak and tired. She's fallen several times, most recently fracturing the proximal humerus on the RIGHT. She then didn't eat or drink much and became dehydrated, requiring IV hydration. Visits here and in the ED reviewed. She saw ortho last week, and continues in the sling.  "I just feel sleepy and weak." Her daughter, who accompanies her today, has been making sure she's drinking and eating. The patient doesn't like to drink much because that increases her urinary frequency.  Review of Systems Review of Systems  Constitutional: Positive for diaphoresis (one episode 2 nights ago, "I had to kick the covers off.") and fatigue. Negative for fever and chills.  HENT: Negative for congestion, rhinorrhea and trouble swallowing.   Eyes: Negative for visual disturbance.  Respiratory: Negative for cough, chest tightness, shortness of breath and wheezing.   Cardiovascular: Negative for chest pain, palpitations and leg swelling.  Gastrointestinal: Positive for nausea. Negative for vomiting, abdominal pain and diarrhea.  Genitourinary: Negative for dysuria, urgency and frequency.  Musculoskeletal:       RIGHT arm pain, consistent with humeral fracture. In sling.  Skin: Positive for wound.  Neurological: Positive for weakness. Negative for dizziness, speech difficulty, light-headedness and headaches.       Patient Active Problem List   Diagnosis Date Noted  . Rectal bleeding 07/16/2013  . Hyperlipidemia, statin intolerance 04/30/2013  . Paroxysmal atrial fibrillation 04/30/2013  . Pacemaker - dual chamber Medtronic 2009 04/30/2013  . AAA (abdominal aortic aneurysm) 04/30/2013  . Bilateral  carotid artery stenosis 04/30/2013  . PAD (peripheral artery disease) 04/30/2013  . Constipation 08/31/2011  . Hypotension 08/28/2011  . S/P total hip arthroplasty 08/28/2011  . CAD (coronary artery disease) 08/28/2011  . Hypothyroid 08/28/2011  . UTI (lower urinary tract infection) 08/28/2011  . Thrombocytopenia 08/28/2011     Prior to Admission medications   Medication Sig Start Date End Date Taking? Authorizing Provider  amiodarone (PACERONE) 200 MG tablet TAKE 1 TABLET BY MOUTH DAILY. 01/14/14  Yes Mihai Croitoru, MD  Biotin 1000 MCG tablet Take 1,000 mcg by mouth daily.   Yes Historical Provider, MD  Calcium Carb-Cholecalciferol (CALCIUM 600 + D PO) Take 1 tablet by mouth daily.   Yes Historical Provider, MD  clopidogrel (PLAVIX) 75 MG tablet Take 1 tablet (75 mg total) by mouth daily with breakfast. 07/09/13  Yes Mihai Croitoru, MD  cycloSPORINE (RESTASIS) 0.05 % ophthalmic emulsion Place 1 drop into both eyes 2 (two) times daily.   Yes Historical Provider, MD  ferrous sulfate 325 (65 FE) MG tablet Take 325 mg by mouth daily with breakfast.    Yes Historical Provider, MD  furosemide (LASIX) 20 MG tablet Take 20 mg by mouth daily.    Yes Historical Provider, MD  levothyroxine (SYNTHROID, LEVOTHROID) 125 MCG tablet TAKE 1 TABLET BY MOUTH EVERY DAY 10/30/13  Yes Lorretta Harp, MD  metoprolol succinate (TOPROL-XL) 25 MG 24 hr tablet Take 12.5 mg by mouth daily.   Yes Historical Provider, MD  potassium chloride SA (K-DUR,KLOR-CON) 20 MEQ tablet Take 20 mEq by mouth daily.    Yes Historical Provider, MD  ranitidine (ZANTAC) 300 MG tablet Take 300  mg by mouth at bedtime.    Yes Historical Provider, MD  vitamin B-12 (CYANOCOBALAMIN) 1000 MCG tablet Take 1,000 mcg by mouth daily.   Yes Historical Provider, MD     Allergies  Allergen Reactions  . Nubain [Nalbuphine Hcl] Anaphylaxis  . Statins     Leg weakness  . Iodine Rash       Objective:  Physical Exam  Physical Exam    Constitutional: She is oriented to person, place, and time. She appears well-developed and well-nourished. She is active and cooperative. No distress.  BP 117/75 mmHg  Pulse 90  Temp(Src) 97.8 F (36.6 C) (Oral)  Resp 24  Ht 5\' 2"  (1.575 m)  Wt 114 lb 2 oz (51.767 kg)  BMI 20.87 kg/m2  SpO2 95%   HENT:  Very hard of hearing.  Eyes: Conjunctivae are normal. No scleral icterus.  Neck: No thyromegaly present.  Cardiovascular: Normal rate, regular rhythm and normal heart sounds.   Pulmonary/Chest: Effort normal.  Lymphadenopathy:    She has no cervical adenopathy.  Neurological: She is alert and oriented to person, place, and time.  Skin: Skin is warm and dry.     Psychiatric: She has a normal mood and affect. Her speech is normal and behavior is normal. Thought content normal. Cognition and memory are impaired (mildly). She does not express impulsivity or inappropriate judgment.           Assessment & Plan:  1. Dehydration Resolved.  2. Fracture of right shoulder, with routine healing, subsequent encounter Continue sling and other recommendations/follow-up per ortho.  3. Body mass index (BMI) of 20.0-20.9 in adult Continue oral hydration, nutrition.   The patient's PCP is out of the office, possibly not returning to practice.  She's encouraged to seek follow-up for feeling sleepy and weak with another provider in that office, or to establish with [facility].  Her symptoms may be age related, but given her falls and recent dehydration, she needs additional evaluation. She may need medication adjustments given her fall risk, or additional nutritional support.   Fara Chute, PA-C Physician Assistant-Certified [facility]

## 2014-06-30 ENCOUNTER — Other Ambulatory Visit: Payer: Self-pay | Admitting: Cardiovascular Disease

## 2014-07-01 NOTE — Telephone Encounter (Signed)
Rx(s) sent to pharmacy electronically.  

## 2014-07-26 ENCOUNTER — Ambulatory Visit (INDEPENDENT_AMBULATORY_CARE_PROVIDER_SITE_OTHER): Payer: Medicare Other | Admitting: *Deleted

## 2014-07-26 DIAGNOSIS — I495 Sick sinus syndrome: Secondary | ICD-10-CM

## 2014-07-26 DIAGNOSIS — I480 Paroxysmal atrial fibrillation: Secondary | ICD-10-CM

## 2014-07-26 LAB — CUP PACEART REMOTE DEVICE CHECK
Battery Impedance: 3465 Ohm
Battery Voltage: 2.72 V
Brady Statistic AP VS Percent: 99 %
Brady Statistic AS VP Percent: 0 %
Brady Statistic AS VS Percent: 0 %
Lead Channel Impedance Value: 511 Ohm
Lead Channel Pacing Threshold Amplitude: 1 V
Lead Channel Pacing Threshold Pulse Width: 0.4 ms
Lead Channel Sensing Intrinsic Amplitude: 8 mV
Lead Channel Setting Pacing Amplitude: 2 V
Lead Channel Setting Pacing Pulse Width: 0.4 ms
Lead Channel Setting Sensing Sensitivity: 4 mV
MDC IDC MSMT BATTERY REMAINING LONGEVITY: 15 mo
MDC IDC MSMT LEADCHNL RA PACING THRESHOLD AMPLITUDE: 0.75 V
MDC IDC MSMT LEADCHNL RV IMPEDANCE VALUE: 509 Ohm
MDC IDC MSMT LEADCHNL RV PACING THRESHOLD PULSEWIDTH: 0.4 ms
MDC IDC SESS DTM: 20160513205341
MDC IDC SET LEADCHNL RA PACING AMPLITUDE: 1.5 V
MDC IDC STAT BRADY AP VP PERCENT: 1 %

## 2014-07-29 ENCOUNTER — Other Ambulatory Visit: Payer: Self-pay | Admitting: Cardiovascular Disease

## 2014-07-29 DIAGNOSIS — I714 Abdominal aortic aneurysm, without rupture, unspecified: Secondary | ICD-10-CM

## 2014-07-29 DIAGNOSIS — S42254D Nondisplaced fracture of greater tuberosity of right humerus, subsequent encounter for fracture with routine healing: Secondary | ICD-10-CM | POA: Diagnosis not present

## 2014-08-01 ENCOUNTER — Ambulatory Visit (INDEPENDENT_AMBULATORY_CARE_PROVIDER_SITE_OTHER): Payer: Medicare Other | Admitting: Nurse Practitioner

## 2014-08-01 ENCOUNTER — Encounter: Payer: Self-pay | Admitting: Nurse Practitioner

## 2014-08-01 VITALS — BP 110/68 | HR 84 | Temp 97.4°F | Resp 20 | Ht 62.0 in | Wt 109.0 lb

## 2014-08-01 DIAGNOSIS — I2510 Atherosclerotic heart disease of native coronary artery without angina pectoris: Secondary | ICD-10-CM | POA: Diagnosis not present

## 2014-08-01 DIAGNOSIS — E039 Hypothyroidism, unspecified: Secondary | ICD-10-CM

## 2014-08-01 DIAGNOSIS — Z950 Presence of cardiac pacemaker: Secondary | ICD-10-CM

## 2014-08-01 DIAGNOSIS — K219 Gastro-esophageal reflux disease without esophagitis: Secondary | ICD-10-CM | POA: Diagnosis not present

## 2014-08-01 DIAGNOSIS — I480 Paroxysmal atrial fibrillation: Secondary | ICD-10-CM | POA: Diagnosis not present

## 2014-08-01 MED ORDER — PANTOPRAZOLE SODIUM 40 MG PO TBEC: 40.0000 mg | DELAYED_RELEASE_TABLET | Freq: Every day | ORAL | Status: DC

## 2014-08-01 NOTE — Patient Instructions (Signed)
Increase intake Need to drink more fluids Increase food intake-- take supplements at lease TWICE daily  Follow up in 1 month for extended visit

## 2014-08-01 NOTE — Progress Notes (Signed)
Patient ID: [name], female   DOB: 18-Oct-1917, 81 y.o.   MRN: [id number]    PCP: Florina Ou, MD  Allergies  Allergen Reactions  . Nubain [Nalbuphine Hcl] Anaphylaxis  . Statins     Leg weakness  . Iodine Rash    Chief Complaint  Patient presents with  . Establish Care    Establish care     HPI: Patient is a 81 y.o. female seen in the office today to establish care. Previous being seen in [place] (PCP retired) from [place]. Pt lives alone. Hx of falls. Recent fracture of the right shoulder.  Pt with a pmh of a fib, GERD, anemia, hypothyroid, HTN, CAD, MI, OP (did not tolerate medications), hyperlipidemia (did not tolerate statin)  Pt does not have good appetite, food gets stopped up in her esophagus, known esophageal stricture (unable to have dilatation any more) which limites her intake.   Reads most the day- does not do much around the house. Daughter lives next door   Sister pays pts bill  Advanced Directive information Does patient have an advance directive?: No, Would patient like information on creating an advanced directive?: Yes - Educational materials given Review of Systems:  Review of Systems  Constitutional: Positive for unexpected weight change (weight loss, not eating much). Negative for activity change, appetite change and fatigue.  HENT: Positive for hearing loss. Negative for congestion.   Eyes: Negative.   Respiratory: Negative for cough and shortness of breath.   Cardiovascular: Negative for chest pain, palpitations and leg swelling.  Gastrointestinal: Negative for abdominal pain, diarrhea and constipation.  Genitourinary: Negative for dysuria and difficulty urinating.  Musculoskeletal: Negative for myalgias and arthralgias.  Skin: Negative for color change and wound.  Neurological: Positive for dizziness and light-headedness. Negative for weakness.  Psychiatric/Behavioral: Negative for behavioral problems, confusion and agitation.    Past  Medical History  Diagnosis Date  . GI bleed 2007  . Hypertension   . TIA (transient ischemic attack)   . Hypothyroidism   . Abdominal aortic aneurysm   . CAD (coronary artery disease)   . SSS (sick sinus syndrome) 10/05/2007    Medtronic Adapta  . Myocardial infarction   . Atrial fibrillation   . COPD (chronic obstructive pulmonary disease)   . Asthma     hx  . Anemia     hx  . Blood transfusion   . UTI (lower urinary tract infection)     hx  . Renal failure, chronic   . H/O hiatal hernia   . GERD (gastroesophageal reflux disease)   . Stroke   . Osteoporosis   . Blood transfusion without reported diagnosis   . Pacemaker    Past Surgical History  Procedure Laterality Date  . Pacemaker insertion  10/05/2007    Medtronic Adapta  . Coronary stent placement      x9  . Cardiac catheterization    . Fracture surgery      femur fx, /w ORIF into HIP- 2008  . Total hip arthroplasty      planned for 08/27/2011 Left  . Total hip arthroplasty  08/27/2011    Procedure: TOTAL HIP ARTHROPLASTY;  Surgeon: Kerin Salen, MD;  Location: [place];  Service: Orthopedics;  Laterality: Right;  . US echocardiography  08/23/2011    EF 50%,LA mod. dilated,mild to mod. mitral annular ca+,mod. MR,mild to mod TR,mild to mod. PH,trace AI.  [name] Nm myocar perf wall motion  10/08/2010    mild apical anterior ischemia  .  Colonoscopy     Social History:   reports that she has quit smoking. Her smoking use included Cigarettes. She has never used smokeless tobacco. She reports that she does not drink alcohol or use illicit drugs.  Family History  Problem Relation Age of Onset  . Emphysema Father   . Heart disease Mother   . Cancer Brother   . Cancer Brother   . Cancer Sister   . Cancer Sister   . Diabetes Daughter   . Atrial fibrillation Daughter   . Atrial fibrillation Daughter   . Stroke Daughter   . Heart Problems Son     Medications: Patient's Medications  New Prescriptions   No medications on  file  Previous Medications   AMIODARONE (PACERONE) 200 MG TABLET    TAKE 1 TABLET BY MOUTH DAILY.   BIOTIN 1000 MCG TABLET    Take 1,000 mcg by mouth daily.   CALCIUM CARB-CHOLECALCIFEROL (CALCIUM 600 + D PO)    Take 1 tablet by mouth daily.   CLOPIDOGREL (PLAVIX) 75 MG TABLET    Take 1 tablet (75 mg total) by mouth daily.   CYCLOSPORINE (RESTASIS) 0.05 % OPHTHALMIC EMULSION    Place 1 drop into both eyes 2 (two) times daily.   FERROUS SULFATE 325 (65 FE) MG TABLET    Take 325 mg by mouth daily with breakfast.    FUROSEMIDE (LASIX) 20 MG TABLET    Take 20 mg by mouth daily.    LEVOTHYROXINE (SYNTHROID, LEVOTHROID) 125 MCG TABLET    TAKE 1 TABLET BY MOUTH EVERY DAY   METOPROLOL SUCCINATE (TOPROL-XL) 25 MG 24 HR TABLET    Take 12.5 mg by mouth daily.   POTASSIUM CHLORIDE SA (K-DUR,KLOR-CON) 20 MEQ TABLET    Take 20 mEq by mouth daily.    RANITIDINE (ZANTAC) 300 MG TABLET    Take 300 mg by mouth at bedtime.    VITAMIN B-12 (CYANOCOBALAMIN) 1000 MCG TABLET    Take 1,000 mcg by mouth daily.  Modified Medications   No medications on file  Discontinued Medications   No medications on file     Physical Exam:  Filed Vitals:   08/01/14 1259  Pulse: 84  Temp: 97.4 F (36.3 C)  TempSrc: Oral  Resp: 20  Height: 5\' 2"  (1.575 m)  Weight: 109 lb (49.442 kg)  SpO2: 91%    Physical Exam  Constitutional: She is oriented to person, place, and time. No distress.  HENT:  Head: Normocephalic and atraumatic.  Mouth/Throat: Oropharynx is clear and moist. No oropharyngeal exudate.  Eyes: Conjunctivae and EOM are normal. Pupils are equal, round, and reactive to light.  Neck: Normal range of motion. Neck supple.  Cardiovascular: Normal rate, regular rhythm and normal heart sounds.   Pulmonary/Chest: Effort normal and breath sounds normal.  Abdominal: Soft. Bowel sounds are normal.  Musculoskeletal: She exhibits no edema or tenderness.  Neurological: She is alert and oriented to person, place, and  time.  Skin: Skin is warm and dry. She is not diaphoretic.    Labs reviewed: Basic Metabolic Panel:  Recent Labs  06/19/14 2034  NA 135  K 5.1  CL 97  CO2 28  GLUCOSE 101*  BUN 22  CREATININE 1.48*  CALCIUM 8.7   Liver Function Tests:  Recent Labs  06/19/14 2034  AST 59*  ALT 42*  ALKPHOS 76  BILITOT 0.7  PROT 6.2  ALBUMIN 3.1*   No results for input(s): LIPASE, AMYLASE in the last 8760 hours. No  results for input(s): AMMONIA in the last 8760 hours. CBC:  Recent Labs  06/19/14 2034  WBC 6.6  NEUTROABS 3.8  HGB 11.7*  HCT 37.2  MCV 103.0*  PLT 106*   Lipid Panel: No results for input(s): CHOL, HDL, LDLCALC, TRIG, CHOLHDL, LDLDIRECT in the last 8760 hours. TSH: No results for input(s): TSH in the last 8760 hours. A1C: Lab Results  Component Value Date   HGBA1C  02/27/2007    5.8 (NOTE)   The ADA recommends the following therapeutic goals for glycemic   control related to Hgb A1C measurement:   Goal of Therapy:   < 7.0% Hgb A1C   Action Suggested:  > 8.0% Hgb A1C   Ref:  Diabetes Care, 22, Suppl. 1, 1999     Assessment/Plan 1. Gastroesophageal reflux disease, esophagitis presence not specified -worsening symptoms, will start protonix to see if this helps with symptoms and  help with PO intake.  - pantoprazole (PROTONIX) 40 MG tablet; Take 1 tablet (40 mg total) by mouth daily.  Dispense: 30 tablet; Refill: 3 -conts on rantac  2. Coronary artery disease involving native coronary artery of native heart without angina pectoris Without chest pains, conts on plavix.   3. Paroxysmal atrial fibrillation -rate controlled, appears to not on anticoagulation due to falls.  - CBC with Differential - Comprehensive metabolic panel  4. Hypothyroidism, unspecified hypothyroid -on synthroid 125 mcg - follow up TSH  5. Pacemaker - dual chamber Medtronic 2009 -ongoing follow up with cardiology   Will follow up in 4 weeks for EV and MMSE

## 2014-08-01 NOTE — Progress Notes (Signed)
Remote pacemaker transmission.   

## 2014-08-02 ENCOUNTER — Ambulatory Visit: Payer: Medicare Other | Admitting: Podiatrist

## 2014-08-02 LAB — COMPREHENSIVE METABOLIC PANEL
ALBUMIN: 3.9 g/dL (ref 3.2–4.6)
ALK PHOS: 91 IU/L (ref 39–117)
ALT: 17 IU/L (ref 0–32)
AST: 31 IU/L (ref 0–40)
Albumin/Globulin Ratio: 1.9 (ref 1.1–2.5)
BUN / CREAT RATIO: 18 (ref 11–26)
BUN: 24 mg/dL (ref 10–36)
Bilirubin Total: 0.3 mg/dL (ref 0.0–1.2)
CO2: 30 mmol/L — AB (ref 18–29)
CREATININE: 1.37 mg/dL — AB (ref 0.57–1.00)
Calcium: 9.2 mg/dL (ref 8.7–10.3)
Chloride: 100 mmol/L (ref 97–108)
GFR calc Af Amer: 38 mL/min/{1.73_m2} — ABNORMAL LOW (ref >59)
GFR, EST NON AFRICAN AMERICAN: 33 mL/min/{1.73_m2} — AB (ref >59)
GLOBULIN, TOTAL: 2.1 g/dL (ref 1.5–4.5)
Glucose: 88 mg/dL (ref 65–99)
Potassium: 4.7 mmol/L (ref 3.5–5.2)
SODIUM: 144 mmol/L (ref 134–144)
Total Protein: 6 g/dL (ref 6.0–8.5)

## 2014-08-02 LAB — CBC WITH DIFFERENTIAL/PLATELET
BASOS ABS: 0 10*3/uL (ref 0.0–0.2)
Basos: 0 %
EOS (ABSOLUTE): 0.1 10*3/uL (ref 0.0–0.4)
EOS: 2 %
HEMOGLOBIN: 12.1 g/dL (ref 11.1–15.9)
Hematocrit: 38 % (ref 34.0–46.6)
Immature Grans (Abs): 0 10*3/uL (ref 0.0–0.1)
Immature Granulocytes: 0 %
Lymphocytes Absolute: 1.4 10*3/uL (ref 0.7–3.1)
Lymphs: 23 %
MCH: 32.4 pg (ref 26.6–33.0)
MCHC: 31.8 g/dL (ref 31.5–35.7)
MCV: 102 fL — ABNORMAL HIGH (ref 79–97)
Monocytes Absolute: 0.8 10*3/uL (ref 0.1–0.9)
Monocytes: 13 %
NEUTROS PCT: 62 %
Neutrophils Absolute: 4 10*3/uL (ref 1.4–7.0)
Platelets: 146 10*3/uL — ABNORMAL LOW (ref 150–379)
RBC: 3.74 x10E6/uL — AB (ref 3.77–5.28)
RDW: 13.7 % (ref 12.3–15.4)
WBC: 6.4 10*3/uL (ref 3.4–10.8)

## 2014-08-02 LAB — TSH: TSH: 0.569 u[IU]/mL (ref 0.450–4.500)

## 2014-08-04 ENCOUNTER — Other Ambulatory Visit: Payer: Self-pay | Admitting: Cardiovascular Disease

## 2014-08-05 NOTE — Telephone Encounter (Signed)
Rx(s) sent to pharmacy electronically.  

## 2014-08-06 ENCOUNTER — Other Ambulatory Visit (HOSPITAL_COMMUNITY): Payer: Medicare Other

## 2014-08-16 ENCOUNTER — Ambulatory Visit (INDEPENDENT_AMBULATORY_CARE_PROVIDER_SITE_OTHER): Payer: Medicare Other

## 2014-08-16 ENCOUNTER — Ambulatory Visit (HOSPITAL_COMMUNITY)
Admission: RE | Admit: 2014-08-16 | Discharge: 2014-08-16 | Disposition: A | Discharge status: Home / Self Care | Payer: Medicare Other | Source: Ambulatory Visit | Attending: Cardiovascular Disease | Admitting: Cardiovascular Disease

## 2014-08-16 ENCOUNTER — Ambulatory Visit (INDEPENDENT_AMBULATORY_CARE_PROVIDER_SITE_OTHER): Payer: Medicare Other | Admitting: Emergency Medicine

## 2014-08-16 VITALS — BP 104/60 | HR 81 | Temp 97.6°F | Resp 18

## 2014-08-16 DIAGNOSIS — I714 Abdominal aortic aneurysm, without rupture, unspecified: Secondary | ICD-10-CM

## 2014-08-16 DIAGNOSIS — M25561 Pain in right knee: Secondary | ICD-10-CM | POA: Diagnosis not present

## 2014-08-16 DIAGNOSIS — I2510 Atherosclerotic heart disease of native coronary artery without angina pectoris: Secondary | ICD-10-CM

## 2014-08-16 MED ORDER — MELOXICAM 7.5 MG PO TABS: 7.5000 mg | ORAL_TABLET | Freq: Every day | ORAL | Status: DC

## 2014-08-16 NOTE — Progress Notes (Signed)
Subjective:  Patient ID: [name], female    DOB: [birth year]  Age: 81 y.o. MRN: [id number]  CC: Knee Pain   HPI [name] presents  with pain in her right knee. She has pain for 3 weeks. She has no history of injury. No history of overuse. She has been forced to use her cane more for support than she usually does. Pain is worse with ambulation. The pain decreases with elevation and rest  She has been erythema or cellulitis she has no fever or chills. No history of direct injury.  She has no improvement with over-the-counter medication.  Outpatient Prescriptions Prior to Visit  Medication Sig Dispense Refill  . amiodarone (PACERONE) 200 MG tablet TAKE 1 TABLET BY MOUTH DAILY. 30 tablet 4  . Biotin 1000 MCG tablet Take 1,000 mcg by mouth daily.    . Calcium Carb-Cholecalciferol (CALCIUM 600 + D PO) Take 1 tablet by mouth daily.    . clopidogrel (PLAVIX) 75 MG tablet Take 1 tablet (75 mg total) by mouth daily. 90 tablet 1  . cycloSPORINE (RESTASIS) 0.05 % ophthalmic emulsion Place 1 drop into both eyes 2 (two) times daily.    . ferrous sulfate 325 (65 FE) MG tablet Take 325 mg by mouth daily with breakfast.     . furosemide (LASIX) 20 MG tablet Take 20 mg by mouth daily.     [name] levothyroxine (SYNTHROID, LEVOTHROID) 125 MCG tablet TAKE 1 TABLET BY MOUTH EVERY DAY 30 tablet 0  . metoprolol succinate (TOPROL-XL) 25 MG 24 hr tablet Take 12.5 mg by mouth daily.    . pantoprazole (PROTONIX) 40 MG tablet Take 1 tablet (40 mg total) by mouth daily. 30 tablet 3  . potassium chloride SA (K-DUR,KLOR-CON) 20 MEQ tablet Take 20 mEq by mouth daily.     . ranitidine (ZANTAC) 300 MG tablet Take 300 mg by mouth at bedtime.     . vitamin B-12 (CYANOCOBALAMIN) 1000 MCG tablet Take 1,000 mcg by mouth daily.     No facility-administered medications prior to visit.    History   Social History  . Marital Status: Widowed    Spouse Name: N/A  . Number of Children: 4  . Years of Education: N/A    Occupational History  . Retired    Social History Main Topics  . Smoking status: Former Smoker    Types: Cigarettes  . Smokeless tobacco: Never Used     Comment: quit 40 yrs ago- was smoking 2 packs a day at the most  . Alcohol Use: No  . Drug Use: No  . Sexual Activity: Yes    Birth Control/ Protection: Post-menopausal   Other Topics Concern  . None   Social History Narrative   Diet:      Do you drink/ eat things with caffeine? yes      Marital status:    widow                           What year were you married ?  1940      Do you live in a house, apartment,assistred living, condo, trailer, etc.)?  Mobile home      Is it one or more stories? 1      How many persons live in your home ?  1      Do you have any pets in your home ?(please list)  1 cat  Current or past profession: Oncologist      Do you exercise?  yes                           Type & how often: small amount      Do you have a living will?  no      Do you have a DNR form?     no                  If not, do you want to discuss one?       Do you have signed POA?HPOA forms?  no               If so, please bring to your        appointment          Family History  Problem Relation Age of Onset  . Emphysema Father   . Heart disease Mother   . Cancer Brother   . Cancer Brother   . Cancer Sister   . Cancer Sister   . Diabetes Daughter   . Atrial fibrillation Daughter   . Atrial fibrillation Daughter   . Stroke Daughter   . Heart Problems Son     Past Medical History  Diagnosis Date  . GI bleed 2007  . Hypertension   . TIA (transient ischemic attack)   . Hypothyroidism   . Abdominal aortic aneurysm   . CAD (coronary artery disease)   . SSS (sick sinus syndrome) 10/05/2007    Medtronic Adapta  . Myocardial infarction   . Atrial fibrillation   . COPD (chronic obstructive pulmonary disease)   . Asthma     hx  . Anemia     hx  . Blood transfusion   . UTI (lower urinary tract  infection)     hx  . Renal failure, chronic   . H/O hiatal hernia   . GERD (gastroesophageal reflux disease)   . Stroke   . Osteoporosis   . Blood transfusion without reported diagnosis   . Pacemaker      Review of Systems  Constitutional: Negative for fever, chills and appetite change.  HENT: Negative for congestion, ear pain, postnasal drip, sinus pressure and sore throat.   Eyes: Negative for pain and redness.  Respiratory: Negative for cough, shortness of breath and wheezing.   Cardiovascular: Negative for leg swelling.  Gastrointestinal: Negative for nausea, vomiting, abdominal pain, diarrhea, constipation and blood in stool.  Endocrine: Negative for polyuria.  Genitourinary: Negative for dysuria, urgency, frequency and flank pain.  Musculoskeletal: Positive for joint swelling, arthralgias and gait problem.  Skin: Negative for rash.  Neurological: Negative for weakness and headaches.  Psychiatric/Behavioral: Negative for confusion and decreased concentration. The patient is not nervous/anxious.     Objective:  BP 104/60 mmHg  Pulse 81  Temp(Src) 97.6 F (36.4 C) (Oral)  Resp 18  SpO2 91%  BP Readings from Last 3 Encounters:  08/16/14 104/60  08/01/14 110/68  06/23/14 117/75    Wt Readings from Last 3 Encounters:  08/01/14 109 lb (49.442 kg)  06/23/14 114 lb 2 oz (51.767 kg)  06/16/14 109 lb 8 oz (49.669 kg)    Physical Exam  Constitutional: She is oriented to person, place, and time. She appears well-developed and well-nourished. No distress.  HENT:  Head: Normocephalic and atraumatic.  Right Ear: External ear normal.  Left Ear: External ear normal.  Nose: Nose  normal.  Eyes: Conjunctivae and EOM are normal. Pupils are equal, round, and reactive to light. No scleral icterus.  Neck: Normal range of motion. Neck supple. No tracheal deviation present.  Cardiovascular: Normal rate, regular rhythm and normal heart sounds.   Pulmonary/Chest: Effort normal. No  respiratory distress. She has no wheezes. She has no rales.  Abdominal: She exhibits no mass. There is no tenderness. There is no rebound and no guarding.  Musculoskeletal: She exhibits no edema.       Right knee: She exhibits swelling. She exhibits no ecchymosis, no deformity, no erythema and normal alignment. Tenderness found.  Lymphadenopathy:    She has no cervical adenopathy.  Neurological: She is alert and oriented to person, place, and time. Coordination normal.  Skin: Skin is warm and dry. No rash noted.  Psychiatric: She has a normal mood and affect. Her behavior is normal.    Lab Results  Component Value Date   WBC 6.4 08/01/2014   HGB 11.7* 06/19/2014   HCT 38.0 08/01/2014   PLT 106* 06/19/2014   GLUCOSE 88 08/01/2014   CHOL 182 12/01/2012   TRIG 118 12/01/2012   HDL 56 12/01/2012   LDLCALC 102* 12/01/2012   ALT 17 08/01/2014   AST 31 08/01/2014   NA 144 08/01/2014   K 4.7 08/01/2014   CL 100 08/01/2014   CREATININE 1.37* 08/01/2014   BUN 24 08/01/2014   CO2 30* 08/01/2014   TSH 0.569 08/01/2014   INR 1.01 08/30/2011   HGBA1C  02/27/2007    5.8 (NOTE)   The ADA recommends the following therapeutic goals for glycemic   control related to Hgb A1C measurement:   Goal of Therapy:   < 7.0% Hgb A1C   Action Suggested:  > 8.0% Hgb A1C   Ref:  Diabetes Care, 22, Suppl. 1, 1999      .  Assessment & Plan:   Lissette was seen today for knee pain.  Diagnoses and all orders for this visit:  Right knee pain Orders: -     DG Knee Complete 4 Views Right; Future   I am having Ms. Simons maintain her furosemide, ferrous sulfate, ranitidine, potassium chloride SA, cycloSPORINE, metoprolol succinate, Calcium Carb-Cholecalciferol (CALCIUM 600 + D PO), vitamin B-12, Biotin, levothyroxine, clopidogrel, pantoprazole, and amiodarone.  No orders of the defined types were placed in this encounter.    Appropriate red flag conditions were discussed with the patient as well as  actions that should be taken.  Patient expressed his understanding.  Follow-up: No Follow-up on file.  Roselee Culver, MD   [facility] reading (PRIMARY) by  Dr. Ouida Sills.  negative.

## 2014-08-16 NOTE — Patient Instructions (Signed)

## 2014-08-23 ENCOUNTER — Encounter: Payer: Self-pay | Admitting: Cardiology

## 2014-08-27 ENCOUNTER — Encounter: Payer: Self-pay | Admitting: Cardiovascular Disease

## 2014-09-02 DIAGNOSIS — S42254D Nondisplaced fracture of greater tuberosity of right humerus, subsequent encounter for fracture with routine healing: Secondary | ICD-10-CM | POA: Diagnosis not present

## 2014-09-03 ENCOUNTER — Telehealth: Payer: Self-pay | Admitting: *Deleted

## 2014-09-03 DIAGNOSIS — I714 Abdominal aortic aneurysm, without rupture, unspecified: Secondary | ICD-10-CM

## 2014-09-03 NOTE — Telephone Encounter (Signed)
-----   Message from Sanda Klein, MD sent at 09/02/2014  3:41 PM EDT ----- Slight increase in AAA size from 3.8 to 4.2 cm, repeat in 6 months. Copy PCP please

## 2014-09-03 NOTE — Telephone Encounter (Signed)
Results of abdominal U/S called to patient's daughter.  Voiced understanding.  Order placed for repeat study in 6 months and copy faxed to PCP.

## 2014-09-12 ENCOUNTER — Encounter: Payer: Self-pay | Admitting: Nurse Practitioner

## 2014-09-12 ENCOUNTER — Ambulatory Visit (INDEPENDENT_AMBULATORY_CARE_PROVIDER_SITE_OTHER): Payer: Medicare Other | Admitting: Nurse Practitioner

## 2014-09-12 VITALS — BP 110/70 | HR 76 | Temp 96.8°F | Resp 20 | Ht 62.0 in | Wt 110.4 lb

## 2014-09-12 DIAGNOSIS — R413 Other amnesia: Secondary | ICD-10-CM | POA: Diagnosis not present

## 2014-09-12 DIAGNOSIS — E039 Hypothyroidism, unspecified: Secondary | ICD-10-CM

## 2014-09-12 DIAGNOSIS — N183 Chronic kidney disease, stage 3 unspecified: Secondary | ICD-10-CM

## 2014-09-12 DIAGNOSIS — E538 Deficiency of other specified B group vitamins: Secondary | ICD-10-CM

## 2014-09-12 DIAGNOSIS — Z0000 Encounter for general adult medical examination without abnormal findings: Secondary | ICD-10-CM | POA: Diagnosis not present

## 2014-09-12 DIAGNOSIS — K5900 Constipation, unspecified: Secondary | ICD-10-CM

## 2014-09-12 DIAGNOSIS — R296 Repeated falls: Secondary | ICD-10-CM | POA: Diagnosis not present

## 2014-09-12 DIAGNOSIS — R609 Edema, unspecified: Secondary | ICD-10-CM | POA: Diagnosis not present

## 2014-09-12 DIAGNOSIS — I480 Paroxysmal atrial fibrillation: Secondary | ICD-10-CM

## 2014-09-12 DIAGNOSIS — I2510 Atherosclerotic heart disease of native coronary artery without angina pectoris: Secondary | ICD-10-CM | POA: Diagnosis not present

## 2014-09-12 NOTE — Progress Notes (Signed)
Patient ID: [name], female   DOB: 1918-01-11, 81 y.o.   MRN: [id number]    PCP: Florina Ou, MD  Allergies  Allergen Reactions  . Nubain [Nalbuphine Hcl] Anaphylaxis  . Statins     Leg weakness  . Iodine Rash    Chief Complaint  Patient presents with  . Annual Exam    check hemorrhoids     HPI: Patient is a 81 y.o. female seen in the office today to for annual exam care. Previous being seen in [place] (PCP retired) from [place]. Pt lives alone. Hx of falls. Recent fracture of the right shoulder.  Pt with a pmh of a fib, GERD, anemia, hypothyroid, HTN, CAD, MI, OP (did not tolerate medications), hyperlipidemia (did not tolerate statin).  Reports fall due to rug, daughter moved rug.   Functional Status of ADLs: Toiling,bathing, preparing meals and dressing- independently   Aged out of cervical screening, last colonoscopy 2007, dexa scan completed in 2010  Has lost weight since last visit, started ensures but unsure how many she is consuming   mini-mental status exam MMSE - Mini Mental State Exam 09/12/2014  Orientation to time 3  Orientation to Place 5  Registration 3  Attention/ Calculation 0  Recall 3  Language- name 2 objects 2  Language- repeat 1  Language- follow 3 step command 3  Language- read & follow direction 1  Write a sentence 1  Copy design 0  Total score 22   Review of Systems:  Review of Systems  Constitutional: Positive for unexpected weight change (weight loss, not eating much). Negative for activity change, appetite change and fatigue.  HENT: Positive for hearing loss (wears hearing aid). Negative for congestion.   Eyes: Negative.   Respiratory: Negative for cough and shortness of breath.   Cardiovascular: Negative for chest pain, palpitations and leg swelling.  Gastrointestinal: Positive for diarrhea and constipation. Negative for abdominal pain.  Genitourinary: Negative for dysuria and difficulty urinating.  Musculoskeletal: Positive  for arthralgias. Negative for myalgias.  Skin: Negative for color change and wound.  Neurological: Positive for weakness. Negative for dizziness and light-headedness.  Psychiatric/Behavioral: Negative for behavioral problems, confusion and agitation.    Past Medical History  Diagnosis Date  . GI bleed 2007  . Hypertension   . TIA (transient ischemic attack)   . Hypothyroidism   . Abdominal aortic aneurysm   . CAD (coronary artery disease)   . SSS (sick sinus syndrome) 10/05/2007    Medtronic Adapta  . Myocardial infarction   . Atrial fibrillation   . COPD (chronic obstructive pulmonary disease)   . Asthma     hx  . Anemia     hx  . Blood transfusion   . UTI (lower urinary tract infection)     hx  . Renal failure, chronic   . H/O hiatal hernia   . GERD (gastroesophageal reflux disease)   . Stroke   . Osteoporosis   . Blood transfusion without reported diagnosis   . Pacemaker    Past Surgical History  Procedure Laterality Date  . Pacemaker insertion  10/05/2007    Medtronic Adapta  . Coronary stent placement      x9  . Cardiac catheterization    . Fracture surgery      femur fx, /w ORIF into HIP- 2008  . Total hip arthroplasty      planned for 08/27/2011 Left  . Total hip arthroplasty  08/27/2011    Procedure: TOTAL HIP ARTHROPLASTY;  Surgeon:  Kerin Salen, MD;  Location: [facility];  Service: Orthopedics;  Laterality: Right;  . US echocardiography  08/23/2011    EF 50%,LA mod. dilated,mild to mod. mitral annular ca+,mod. MR,mild to mod TR,mild to mod. PH,trace AI.  [name] Nm myocar perf wall motion  10/08/2010    mild apical anterior ischemia  . Colonoscopy     Social History:   reports that she has quit smoking. Her smoking use included Cigarettes. She has never used smokeless tobacco. She reports that she does not drink alcohol or use illicit drugs.  Family History  Problem Relation Age of Onset  . Emphysema Father   . Heart disease Mother   . Cancer Brother   . Cancer  Brother   . Cancer Sister   . Cancer Sister   . Diabetes Daughter   . Atrial fibrillation Daughter   . Atrial fibrillation Daughter   . Stroke Daughter   . Heart Problems Son     Medications: Patient's Medications  New Prescriptions   No medications on file  Previous Medications   AMIODARONE (PACERONE) 200 MG TABLET    TAKE 1 TABLET BY MOUTH DAILY.   BIOTIN 1000 MCG TABLET    Take 1,000 mcg by mouth daily.   CALCIUM CARB-CHOLECALCIFEROL (CALCIUM 600 + D PO)    Take 1 tablet by mouth daily.   CLOPIDOGREL (PLAVIX) 75 MG TABLET    Take 1 tablet (75 mg total) by mouth daily.   CYCLOSPORINE (RESTASIS) 0.05 % OPHTHALMIC EMULSION    Place 1 drop into both eyes 2 (two) times daily.   FERROUS SULFATE 325 (65 FE) MG TABLET    Take 325 mg by mouth daily with breakfast.    FUROSEMIDE (LASIX) 20 MG TABLET    Take 20 mg by mouth daily.    LEVOTHYROXINE (SYNTHROID, LEVOTHROID) 125 MCG TABLET    TAKE 1 TABLET BY MOUTH EVERY DAY   METOPROLOL SUCCINATE (TOPROL-XL) 25 MG 24 HR TABLET    Take 12.5 mg by mouth daily.   PANTOPRAZOLE (PROTONIX) 40 MG TABLET    Take 1 tablet (40 mg total) by mouth daily.   POTASSIUM CHLORIDE SA (K-DUR,KLOR-CON) 20 MEQ TABLET    Take 20 mEq by mouth daily.    RANITIDINE (ZANTAC) 300 MG TABLET    Take 300 mg by mouth at bedtime.    VITAMIN B-12 (CYANOCOBALAMIN) 1000 MCG TABLET    Take 1,000 mcg by mouth daily.  Modified Medications   No medications on file  Discontinued Medications   MELOXICAM (MOBIC) 7.5 MG TABLET    Take 1 tablet (7.5 mg total) by mouth daily.     Physical Exam:  Filed Vitals:   09/12/14 1123  BP: 110/70  Pulse: 76  Temp: 96.8 F (36 C)  TempSrc: Oral  Resp: 20  Height: 5\' 2"  (1.575 m)  Weight: 110 lb 6.4 oz (50.077 kg)  SpO2: 95%    Physical Exam  Constitutional: She is oriented to person, place, and time. No distress.  Thin frail female, NAD  HENT:  Head: Normocephalic and atraumatic.  Right Ear: External ear normal.  Left Ear:  External ear normal.  Nose: Nose normal.  Mouth/Throat: Oropharynx is clear and moist. No oropharyngeal exudate.  Eyes: Conjunctivae and EOM are normal. Pupils are equal, round, and reactive to light.  Neck: Normal range of motion. Neck supple. No thyromegaly present.  Cardiovascular: Normal rate, regular rhythm, normal heart sounds and intact distal pulses.   Pulmonary/Chest: Effort normal and  breath sounds normal.  Abdominal: Soft. Bowel sounds are normal. She exhibits no distension. There is no tenderness.  Genitourinary: Rectal exam shows external hemorrhoid.  Musculoskeletal: She exhibits no edema or tenderness.  kyphotic spine Limited ROM to right hip Bilateral shoulders with limited ROM  Lymphadenopathy:    She has no cervical adenopathy.  Neurological: She is alert and oriented to person, place, and time.  Skin: Skin is warm and dry. She is not diaphoretic.  bruising noted to left cheek, pt denies fall  Psychiatric: She has a normal mood and affect.    Labs reviewed: Basic Metabolic Panel:  Recent Labs  06/19/14 2034 08/01/14 1401  NA 135 144  K 5.1 4.7  CL 97 100  CO2 28 30*  GLUCOSE 101* 88  BUN 22 24  CREATININE 1.48* 1.37*  CALCIUM 8.7 9.2  TSH  --  0.569   Liver Function Tests:  Recent Labs  06/19/14 2034 08/01/14 1401  AST 59* 31  ALT 42* 17  ALKPHOS 76 91  BILITOT 0.7 0.3  PROT 6.2 6.0  ALBUMIN 3.1*  --    No results for input(s): LIPASE, AMYLASE in the last 8760 hours. No results for input(s): AMMONIA in the last 8760 hours. CBC:  Recent Labs  06/19/14 2034 08/01/14 1401  WBC 6.6 6.4  NEUTROABS 3.8 4.0  HGB 11.7*  --   HCT 37.2 38.0  MCV 103.0*  --   PLT 106*  --    Lipid Panel: No results for input(s): CHOL, HDL, LDLCALC, TRIG, CHOLHDL, LDLDIRECT in the last 8760 hours. TSH:  Recent Labs  08/01/14 1401  TSH 0.569   A1C: Lab Results  Component Value Date   HGBA1C  02/27/2007    5.8 (NOTE)   The ADA recommends the  following therapeutic goals for glycemic   control related to Hgb A1C measurement:   Goal of Therapy:   < 7.0% Hgb A1C   Action Suggested:  > 8.0% Hgb A1C   Ref:  Diabetes Care, 22, Suppl. 1, 1999     Assessment/Plan   1. Falls frequently - Ambulatory referral to [place] for therapy home evaluation, gait and strength training  2. CKD (chronic kidney disease) stage 3, GFR 30-59 ml/min -dicussed increasing fluid intake, will stop lasix and potassium at this time -avoid NSAIDs - Basic metabolic panel  3. Paroxysmal atrial fibrillation -SR at this time, not on anticoagulation due to falls  4. Hypothyroidism, unspecified hypothyroidism type -TSH stable, cont synthroid 125 mcg  5. Memory loss MMSE 22/30, mild- moderate memory loss, daughter doing finances and checks on her daily -educated daughter on memory loss and disease progression, signs to look for indicating worsening impairment, may need increased assistance with meals and hygiene.   6. Edema -stable at this time, will stop lasix due to increased Cr and decreased PO intake, to use TED hose and elevate LE  7. Vitamin B 12 deficiency -cont supplement, CBC stable at this time -conts on iron and b12  8. Preventative health care -discussed preventative health care and aging. Discussed safely in the home. May need increased assistance during meal and bath time.  -increase hydration -weight loss noted,cont ensure,  family to encourage pt to eat at meals.  -will monitor weight   9. Constipation Pt with constipation then diarrhea after laxative or stool softener. Pt will take imodium, discouraged use of imodium but to increase fluid intake and use bene fiber 1-2 times daily   Follow up in 2  months with Dr Mariea Clonts, blood work prior to visit

## 2014-09-12 NOTE — Progress Notes (Signed)
Patient did not pass clock test.

## 2014-09-12 NOTE — Patient Instructions (Addendum)
STOP mobic, lasix, potassium   Elevate legs when sitting Use TED hose during the day  Use OTC for hemorrhoids Avoid constipation!-- no imodium! Increase hydration, use fiber to bulk stools  Will get home health therapy to evaluate for safety  May need helper for several hours during the day  FOLLOW UP IN 2 months with Dr Mariea Clonts- lab work before visit

## 2014-09-14 DIAGNOSIS — M6281 Muscle weakness (generalized): Secondary | ICD-10-CM | POA: Diagnosis not present

## 2014-09-14 DIAGNOSIS — R296 Repeated falls: Secondary | ICD-10-CM | POA: Diagnosis not present

## 2014-09-14 DIAGNOSIS — J449 Chronic obstructive pulmonary disease, unspecified: Secondary | ICD-10-CM | POA: Diagnosis not present

## 2014-09-14 DIAGNOSIS — I129 Hypertensive chronic kidney disease with stage 1 through stage 4 chronic kidney disease, or unspecified chronic kidney disease: Secondary | ICD-10-CM | POA: Diagnosis not present

## 2014-09-14 DIAGNOSIS — I2510 Atherosclerotic heart disease of native coronary artery without angina pectoris: Secondary | ICD-10-CM | POA: Diagnosis not present

## 2014-09-14 DIAGNOSIS — G3184 Mild cognitive impairment, so stated: Secondary | ICD-10-CM | POA: Diagnosis not present

## 2014-09-14 DIAGNOSIS — I480 Paroxysmal atrial fibrillation: Secondary | ICD-10-CM | POA: Diagnosis not present

## 2014-09-14 DIAGNOSIS — R262 Difficulty in walking, not elsewhere classified: Secondary | ICD-10-CM | POA: Diagnosis not present

## 2014-09-14 DIAGNOSIS — Z7902 Long term (current) use of antithrombotics/antiplatelets: Secondary | ICD-10-CM | POA: Diagnosis not present

## 2014-09-14 DIAGNOSIS — Z87440 Personal history of urinary (tract) infections: Secondary | ICD-10-CM | POA: Diagnosis not present

## 2014-09-14 DIAGNOSIS — N183 Chronic kidney disease, stage 3 (moderate): Secondary | ICD-10-CM | POA: Diagnosis not present

## 2014-09-14 DIAGNOSIS — Z950 Presence of cardiac pacemaker: Secondary | ICD-10-CM | POA: Diagnosis not present

## 2014-09-18 DIAGNOSIS — I480 Paroxysmal atrial fibrillation: Secondary | ICD-10-CM | POA: Diagnosis not present

## 2014-09-18 DIAGNOSIS — I2510 Atherosclerotic heart disease of native coronary artery without angina pectoris: Secondary | ICD-10-CM | POA: Diagnosis not present

## 2014-09-18 DIAGNOSIS — R296 Repeated falls: Secondary | ICD-10-CM | POA: Diagnosis not present

## 2014-09-18 DIAGNOSIS — N183 Chronic kidney disease, stage 3 (moderate): Secondary | ICD-10-CM | POA: Diagnosis not present

## 2014-09-18 DIAGNOSIS — I129 Hypertensive chronic kidney disease with stage 1 through stage 4 chronic kidney disease, or unspecified chronic kidney disease: Secondary | ICD-10-CM | POA: Diagnosis not present

## 2014-09-18 DIAGNOSIS — M6281 Muscle weakness (generalized): Secondary | ICD-10-CM | POA: Diagnosis not present

## 2014-09-20 DIAGNOSIS — R296 Repeated falls: Secondary | ICD-10-CM | POA: Diagnosis not present

## 2014-09-20 DIAGNOSIS — N183 Chronic kidney disease, stage 3 (moderate): Secondary | ICD-10-CM | POA: Diagnosis not present

## 2014-09-20 DIAGNOSIS — I480 Paroxysmal atrial fibrillation: Secondary | ICD-10-CM | POA: Diagnosis not present

## 2014-09-20 DIAGNOSIS — I129 Hypertensive chronic kidney disease with stage 1 through stage 4 chronic kidney disease, or unspecified chronic kidney disease: Secondary | ICD-10-CM | POA: Diagnosis not present

## 2014-09-20 DIAGNOSIS — I2510 Atherosclerotic heart disease of native coronary artery without angina pectoris: Secondary | ICD-10-CM | POA: Diagnosis not present

## 2014-09-20 DIAGNOSIS — M6281 Muscle weakness (generalized): Secondary | ICD-10-CM | POA: Diagnosis not present

## 2014-09-21 DIAGNOSIS — R296 Repeated falls: Secondary | ICD-10-CM | POA: Diagnosis not present

## 2014-09-21 DIAGNOSIS — M6281 Muscle weakness (generalized): Secondary | ICD-10-CM | POA: Diagnosis not present

## 2014-09-21 DIAGNOSIS — I129 Hypertensive chronic kidney disease with stage 1 through stage 4 chronic kidney disease, or unspecified chronic kidney disease: Secondary | ICD-10-CM | POA: Diagnosis not present

## 2014-09-21 DIAGNOSIS — I480 Paroxysmal atrial fibrillation: Secondary | ICD-10-CM | POA: Diagnosis not present

## 2014-09-21 DIAGNOSIS — I2510 Atherosclerotic heart disease of native coronary artery without angina pectoris: Secondary | ICD-10-CM | POA: Diagnosis not present

## 2014-09-21 DIAGNOSIS — N183 Chronic kidney disease, stage 3 (moderate): Secondary | ICD-10-CM | POA: Diagnosis not present

## 2014-09-25 DIAGNOSIS — I480 Paroxysmal atrial fibrillation: Secondary | ICD-10-CM | POA: Diagnosis not present

## 2014-09-25 DIAGNOSIS — R296 Repeated falls: Secondary | ICD-10-CM | POA: Diagnosis not present

## 2014-09-25 DIAGNOSIS — M6281 Muscle weakness (generalized): Secondary | ICD-10-CM | POA: Diagnosis not present

## 2014-09-25 DIAGNOSIS — I2510 Atherosclerotic heart disease of native coronary artery without angina pectoris: Secondary | ICD-10-CM | POA: Diagnosis not present

## 2014-09-25 DIAGNOSIS — N183 Chronic kidney disease, stage 3 (moderate): Secondary | ICD-10-CM | POA: Diagnosis not present

## 2014-09-25 DIAGNOSIS — I129 Hypertensive chronic kidney disease with stage 1 through stage 4 chronic kidney disease, or unspecified chronic kidney disease: Secondary | ICD-10-CM | POA: Diagnosis not present

## 2014-09-27 DIAGNOSIS — I2510 Atherosclerotic heart disease of native coronary artery without angina pectoris: Secondary | ICD-10-CM | POA: Diagnosis not present

## 2014-09-27 DIAGNOSIS — I129 Hypertensive chronic kidney disease with stage 1 through stage 4 chronic kidney disease, or unspecified chronic kidney disease: Secondary | ICD-10-CM | POA: Diagnosis not present

## 2014-09-27 DIAGNOSIS — I480 Paroxysmal atrial fibrillation: Secondary | ICD-10-CM | POA: Diagnosis not present

## 2014-09-27 DIAGNOSIS — N183 Chronic kidney disease, stage 3 (moderate): Secondary | ICD-10-CM | POA: Diagnosis not present

## 2014-09-27 DIAGNOSIS — R296 Repeated falls: Secondary | ICD-10-CM | POA: Diagnosis not present

## 2014-09-27 DIAGNOSIS — M6281 Muscle weakness (generalized): Secondary | ICD-10-CM | POA: Diagnosis not present

## 2014-09-28 DIAGNOSIS — I480 Paroxysmal atrial fibrillation: Secondary | ICD-10-CM | POA: Diagnosis not present

## 2014-09-28 DIAGNOSIS — R296 Repeated falls: Secondary | ICD-10-CM | POA: Diagnosis not present

## 2014-09-28 DIAGNOSIS — I129 Hypertensive chronic kidney disease with stage 1 through stage 4 chronic kidney disease, or unspecified chronic kidney disease: Secondary | ICD-10-CM | POA: Diagnosis not present

## 2014-09-28 DIAGNOSIS — I2510 Atherosclerotic heart disease of native coronary artery without angina pectoris: Secondary | ICD-10-CM | POA: Diagnosis not present

## 2014-09-28 DIAGNOSIS — N183 Chronic kidney disease, stage 3 (moderate): Secondary | ICD-10-CM | POA: Diagnosis not present

## 2014-09-28 DIAGNOSIS — M6281 Muscle weakness (generalized): Secondary | ICD-10-CM | POA: Diagnosis not present

## 2014-10-02 DIAGNOSIS — N183 Chronic kidney disease, stage 3 (moderate): Secondary | ICD-10-CM | POA: Diagnosis not present

## 2014-10-02 DIAGNOSIS — R296 Repeated falls: Secondary | ICD-10-CM | POA: Diagnosis not present

## 2014-10-02 DIAGNOSIS — I480 Paroxysmal atrial fibrillation: Secondary | ICD-10-CM | POA: Diagnosis not present

## 2014-10-02 DIAGNOSIS — I2510 Atherosclerotic heart disease of native coronary artery without angina pectoris: Secondary | ICD-10-CM | POA: Diagnosis not present

## 2014-10-02 DIAGNOSIS — I129 Hypertensive chronic kidney disease with stage 1 through stage 4 chronic kidney disease, or unspecified chronic kidney disease: Secondary | ICD-10-CM | POA: Diagnosis not present

## 2014-10-02 DIAGNOSIS — M6281 Muscle weakness (generalized): Secondary | ICD-10-CM | POA: Diagnosis not present

## 2014-10-04 DIAGNOSIS — I2510 Atherosclerotic heart disease of native coronary artery without angina pectoris: Secondary | ICD-10-CM | POA: Diagnosis not present

## 2014-10-04 DIAGNOSIS — N183 Chronic kidney disease, stage 3 (moderate): Secondary | ICD-10-CM | POA: Diagnosis not present

## 2014-10-04 DIAGNOSIS — R296 Repeated falls: Secondary | ICD-10-CM | POA: Diagnosis not present

## 2014-10-04 DIAGNOSIS — I129 Hypertensive chronic kidney disease with stage 1 through stage 4 chronic kidney disease, or unspecified chronic kidney disease: Secondary | ICD-10-CM | POA: Diagnosis not present

## 2014-10-04 DIAGNOSIS — I480 Paroxysmal atrial fibrillation: Secondary | ICD-10-CM | POA: Diagnosis not present

## 2014-10-04 DIAGNOSIS — M6281 Muscle weakness (generalized): Secondary | ICD-10-CM | POA: Diagnosis not present

## 2014-10-07 DIAGNOSIS — I2510 Atherosclerotic heart disease of native coronary artery without angina pectoris: Secondary | ICD-10-CM | POA: Diagnosis not present

## 2014-10-07 DIAGNOSIS — R296 Repeated falls: Secondary | ICD-10-CM | POA: Diagnosis not present

## 2014-10-07 DIAGNOSIS — M6281 Muscle weakness (generalized): Secondary | ICD-10-CM | POA: Diagnosis not present

## 2014-10-07 DIAGNOSIS — I129 Hypertensive chronic kidney disease with stage 1 through stage 4 chronic kidney disease, or unspecified chronic kidney disease: Secondary | ICD-10-CM | POA: Diagnosis not present

## 2014-10-07 DIAGNOSIS — N183 Chronic kidney disease, stage 3 (moderate): Secondary | ICD-10-CM | POA: Diagnosis not present

## 2014-10-07 DIAGNOSIS — I480 Paroxysmal atrial fibrillation: Secondary | ICD-10-CM | POA: Diagnosis not present

## 2014-10-15 ENCOUNTER — Encounter: Payer: Self-pay | Admitting: Nurse Practitioner

## 2014-10-15 ENCOUNTER — Ambulatory Visit (INDEPENDENT_AMBULATORY_CARE_PROVIDER_SITE_OTHER): Payer: Medicare Other | Admitting: Nurse Practitioner

## 2014-10-15 VITALS — BP 118/80 | HR 87 | Temp 97.9°F | Ht 62.0 in | Wt 112.0 lb

## 2014-10-15 DIAGNOSIS — R609 Edema, unspecified: Secondary | ICD-10-CM

## 2014-10-15 DIAGNOSIS — R634 Abnormal weight loss: Secondary | ICD-10-CM | POA: Diagnosis not present

## 2014-10-15 DIAGNOSIS — I509 Heart failure, unspecified: Secondary | ICD-10-CM | POA: Diagnosis not present

## 2014-10-15 DIAGNOSIS — L03119 Cellulitis of unspecified part of limb: Secondary | ICD-10-CM

## 2014-10-15 DIAGNOSIS — N183 Chronic kidney disease, stage 3 unspecified: Secondary | ICD-10-CM

## 2014-10-15 DIAGNOSIS — I2510 Atherosclerotic heart disease of native coronary artery without angina pectoris: Secondary | ICD-10-CM

## 2014-10-15 MED ORDER — FUROSEMIDE 20 MG PO TABS: 20.0000 mg | ORAL_TABLET | Freq: Every day | ORAL | Status: DC

## 2014-10-15 MED ORDER — MIRTAZAPINE 15 MG PO TABS: 15.0000 mg | ORAL_TABLET | Freq: Every day | ORAL | Status: DC

## 2014-10-15 MED ORDER — DOXYCYCLINE HYCLATE 100 MG PO TABS: 100.0000 mg | ORAL_TABLET | Freq: Two times a day (BID) | ORAL | Status: DC

## 2014-10-15 MED ORDER — NITROGLYCERIN 0.4 MG SL SUBL: 0.4000 mg | SUBLINGUAL_TABLET | SUBLINGUAL | Status: AC | PRN

## 2014-10-15 NOTE — Progress Notes (Signed)
Patient ID: [name], female   DOB: Nov 11, 1917, 81 y.o.   MRN: [id number]    PCP: Florina Ou, MD  Allergies  Allergen Reactions  . Nubain [Nalbuphine Hcl] Anaphylaxis  . Statins     Leg weakness  . Iodine Rash    Chief Complaint  Patient presents with  . Acute Visit    Bilateral leg swelling and pain. Patient is elevating legs to help with swelling. Patient restarted Lasix medication on her own (per daughter's recommendations)  . Shortness of Breath    Patient wakes up in the middle of the night with SOB since d/c'ing Lasix   . Medication Refill    Request RX for Nitroglycerin   . Anorexia    Patient needs something to boost appetite     HPI: Patient is a 81 y.o. female seen in the office today for follow up on edema. Pt has painful legs that were so big she thought the legs were going to pop. Pt was waking up in the morning of the night and could not breath. Daughter decided to start lasix back and she has gotten 2 dose. Swelling has already improved and pain is better. Breathing improved with lasix.  Increased redness and tenderness to lower legs with the swelling.   Pt does not want to eat, appetite is very poor. Requesting something for appetite. Eats small amounts during the day   Review of Systems:  Review of Systems  Constitutional: Positive for unexpected weight change (weight loss, not eating much). Negative for activity change, appetite change and fatigue.  HENT: Negative for congestion.   Eyes: Negative.   Respiratory: Positive for shortness of breath (has improved). Negative for cough.   Cardiovascular: Positive for leg swelling (was bad last week, has improved since lasix was started back). Negative for chest pain and palpitations.  Gastrointestinal: Negative for abdominal pain.  Genitourinary: Negative for dysuria and difficulty urinating.  Skin: Negative for color change and wound.  Neurological: Positive for weakness. Negative for dizziness and  light-headedness.    Past Medical History  Diagnosis Date  . GI bleed 2007  . Hypertension   . TIA (transient ischemic attack)   . Hypothyroidism   . Abdominal aortic aneurysm   . CAD (coronary artery disease)   . SSS (sick sinus syndrome) 10/05/2007    Medtronic Adapta  . Myocardial infarction   . Atrial fibrillation   . COPD (chronic obstructive pulmonary disease)   . Asthma     hx  . Anemia     hx  . Blood transfusion   . UTI (lower urinary tract infection)     hx  . Renal failure, chronic   . H/O hiatal hernia   . GERD (gastroesophageal reflux disease)   . Stroke   . Osteoporosis   . Blood transfusion without reported diagnosis   . Pacemaker    Past Surgical History  Procedure Laterality Date  . Pacemaker insertion  10/05/2007    Medtronic Adapta  . Coronary stent placement      x9  . Cardiac catheterization    . Fracture surgery      femur fx, /w ORIF into HIP- 2008  . Total hip arthroplasty      planned for 08/27/2011 Left  . Total hip arthroplasty  08/27/2011    Procedure: TOTAL HIP ARTHROPLASTY;  Surgeon: Kerin Salen, MD;  Location: [facility];  Service: Orthopedics;  Laterality: Right;  . US echocardiography  08/23/2011    EF  50%,LA mod. dilated,mild to mod. mitral annular ca+,mod. MR,mild to mod TR,mild to mod. PH,trace AI.  [name] Nm myocar perf wall motion  10/08/2010    mild apical anterior ischemia  . Colonoscopy     Social History:   reports that she has quit smoking. Her smoking use included Cigarettes. She has never used smokeless tobacco. She reports that she does not drink alcohol or use illicit drugs.  Family History  Problem Relation Age of Onset  . Emphysema Father   . Heart disease Mother   . Cancer Brother   . Cancer Brother   . Cancer Sister   . Cancer Sister   . Diabetes Daughter   . Atrial fibrillation Daughter   . Atrial fibrillation Daughter   . Stroke Daughter   . Heart Problems Son     Medications: Patient's Medications  New  Prescriptions   No medications on file  Previous Medications   AMIODARONE (PACERONE) 200 MG TABLET    TAKE 1 TABLET BY MOUTH DAILY.   BIOTIN 1000 MCG TABLET    Take 1,000 mcg by mouth daily.   CALCIUM CARB-CHOLECALCIFEROL (CALCIUM 600 + D PO)    Take 1 tablet by mouth daily.   CLOPIDOGREL (PLAVIX) 75 MG TABLET    Take 1 tablet (75 mg total) by mouth daily.   CYCLOSPORINE (RESTASIS) 0.05 % OPHTHALMIC EMULSION    Place 1 drop into both eyes 2 (two) times daily.   FEEDING SUPPLEMENT (BOOST HIGH PROTEIN) LIQD    Take 1 Container by mouth daily.   FERROUS SULFATE 325 (65 FE) MG TABLET    Take 325 mg by mouth daily with breakfast.    FUROSEMIDE (LASIX) 20 MG TABLET    20 mg. M/W/F   LEVOTHYROXINE (SYNTHROID, LEVOTHROID) 125 MCG TABLET    TAKE 1 TABLET BY MOUTH EVERY DAY   METOPROLOL SUCCINATE (TOPROL-XL) 25 MG 24 HR TABLET    Take 12.5 mg by mouth daily.   PANTOPRAZOLE (PROTONIX) 40 MG TABLET    Take 1 tablet (40 mg total) by mouth daily.   RANITIDINE (ZANTAC) 300 MG TABLET    Take 300 mg by mouth at bedtime.    VITAMIN B-12 (CYANOCOBALAMIN) 1000 MCG TABLET    Take 1,000 mcg by mouth daily.  Modified Medications   No medications on file  Discontinued Medications   No medications on file     Physical Exam:  Filed Vitals:   10/15/14 1559  BP: 118/80  Pulse: 87  Temp: 97.9 F (36.6 C)  TempSrc: Oral  Height: 5\' 2"  (1.575 m)  Weight: 112 lb (50.803 kg)  SpO2: 93%    Physical Exam  Constitutional: She is oriented to person, place, and time. No distress.  Thin frail female, NAD  HENT:  Head: Normocephalic and atraumatic.  Neck: Normal range of motion. Neck supple.  Cardiovascular: Normal rate, regular rhythm and normal heart sounds.   Pulmonary/Chest: Effort normal and breath sounds normal.  Abdominal: Soft. Bowel sounds are normal. She exhibits no distension. There is no tenderness.  Musculoskeletal: She exhibits edema (2+ pitting edema bilaterally to upper calf with redness). She  exhibits no tenderness.  kyphotic spine   Neurological: She is alert and oriented to person, place, and time.  Skin: Skin is warm and dry. She is not diaphoretic.  Psychiatric: She has a normal mood and affect.    Labs reviewed: Basic Metabolic Panel:  Recent Labs  06/19/14 2034 08/01/14 1401  NA 135 144  K 5.1  4.7  CL 97 100  CO2 28 30*  GLUCOSE 101* 88  BUN 22 24  CREATININE 1.48* 1.37*  CALCIUM 8.7 9.2  TSH  --  0.569   Liver Function Tests:  Recent Labs  06/19/14 2034 08/01/14 1401  AST 59* 31  ALT 42* 17  ALKPHOS 76 91  BILITOT 0.7 0.3  PROT 6.2 6.0  ALBUMIN 3.1*  --    No results for input(s): LIPASE, AMYLASE in the last 8760 hours. No results for input(s): AMMONIA in the last 8760 hours. CBC:  Recent Labs  06/19/14 2034 08/01/14 1401  WBC 6.6 6.4  NEUTROABS 3.8 4.0  HGB 11.7*  --   HCT 37.2 38.0  MCV 103.0*  --   PLT 106*  --    Lipid Panel: No results for input(s): CHOL, HDL, LDLCALC, TRIG, CHOLHDL, LDLDIRECT in the last 8760 hours. TSH:  Recent Labs  08/01/14 1401  TSH 0.569   A1C: Lab Results  Component Value Date   HGBA1C  02/27/2007    5.8 (NOTE)   The ADA recommends the following therapeutic goals for glycemic   control related to Hgb A1C measurement:   Goal of Therapy:   < 7.0% Hgb A1C   Action Suggested:  > 8.0% Hgb A1C   Ref:  Diabetes Care, 22, Suppl. 1, 1999     Assessment/Plan 1. Congestive heart failure, unspecified congestive heart failure chronicity, unspecified congestive heart failure type -EF 50% on echo in 2013, following with cardiology -lasix was stopped at last visit due to poor PO intake however pt with increase edema and shortness of breath  - furosemide (LASIX) 20 MG tablet; Take 1 tablet (20 mg total) by mouth daily. M/W/F  Dispense: 30 tablet; Refill: 3  2. Loss of weight -poor appetite, attempted ensures will add Remeron to help with appetite  - mirtazapine (REMERON) 15 MG tablet; Take 1 tablet (15 mg  total) by mouth at bedtime. For appetite  Dispense: 30 tablet; Refill: 3  3. CKD (chronic kidney disease) stage 3, GFR 30-59 ml/min encouraged good hydration, will follow up labs before next appt  4. Edema With increased redness to bilateral LE, lasix restarted to take Wednesday, thursday and Friday this week, then resume M, W, F  5. Cellulitis Increased redness into legs, doxycyline 100 mg PO twice daily for 7 days, to take with food  -notify if redness worsens or fails to improved  Return precautions discussed with pt and daughters, all understand and without question To keep follow up with Dr Sharee Holster K. Harle Battiest  [facility] [phone] 8 am - 5 pm) [phone] (after hours)

## 2014-10-15 NOTE — Patient Instructions (Addendum)
Keep follow up with Dr Mariea Clonts  To take lasix Wednesday, Thursday, Friday this week Then start lasix 20 mg Monday, Wednesday, Friday  Start Remeron 15 mg daily at bedtime for appetite

## 2014-10-16 DIAGNOSIS — I2510 Atherosclerotic heart disease of native coronary artery without angina pectoris: Secondary | ICD-10-CM | POA: Diagnosis not present

## 2014-10-16 DIAGNOSIS — N183 Chronic kidney disease, stage 3 (moderate): Secondary | ICD-10-CM | POA: Diagnosis not present

## 2014-10-16 DIAGNOSIS — R296 Repeated falls: Secondary | ICD-10-CM | POA: Diagnosis not present

## 2014-10-16 DIAGNOSIS — I129 Hypertensive chronic kidney disease with stage 1 through stage 4 chronic kidney disease, or unspecified chronic kidney disease: Secondary | ICD-10-CM | POA: Diagnosis not present

## 2014-10-16 DIAGNOSIS — M6281 Muscle weakness (generalized): Secondary | ICD-10-CM | POA: Diagnosis not present

## 2014-10-16 DIAGNOSIS — I480 Paroxysmal atrial fibrillation: Secondary | ICD-10-CM | POA: Diagnosis not present

## 2014-10-17 DIAGNOSIS — H04123 Dry eye syndrome of bilateral lacrimal glands: Secondary | ICD-10-CM | POA: Diagnosis not present

## 2014-10-17 DIAGNOSIS — H01001 Unspecified blepharitis right upper eyelid: Secondary | ICD-10-CM | POA: Diagnosis not present

## 2014-10-17 DIAGNOSIS — H02052 Trichiasis without entropian right lower eyelid: Secondary | ICD-10-CM | POA: Diagnosis not present

## 2014-10-17 DIAGNOSIS — H02055 Trichiasis without entropian left lower eyelid: Secondary | ICD-10-CM | POA: Diagnosis not present

## 2014-10-18 DIAGNOSIS — N183 Chronic kidney disease, stage 3 (moderate): Secondary | ICD-10-CM | POA: Diagnosis not present

## 2014-10-18 DIAGNOSIS — I2510 Atherosclerotic heart disease of native coronary artery without angina pectoris: Secondary | ICD-10-CM | POA: Diagnosis not present

## 2014-10-18 DIAGNOSIS — I129 Hypertensive chronic kidney disease with stage 1 through stage 4 chronic kidney disease, or unspecified chronic kidney disease: Secondary | ICD-10-CM | POA: Diagnosis not present

## 2014-10-18 DIAGNOSIS — I480 Paroxysmal atrial fibrillation: Secondary | ICD-10-CM | POA: Diagnosis not present

## 2014-10-18 DIAGNOSIS — R296 Repeated falls: Secondary | ICD-10-CM | POA: Diagnosis not present

## 2014-10-18 DIAGNOSIS — M6281 Muscle weakness (generalized): Secondary | ICD-10-CM | POA: Diagnosis not present

## 2014-10-22 DIAGNOSIS — I129 Hypertensive chronic kidney disease with stage 1 through stage 4 chronic kidney disease, or unspecified chronic kidney disease: Secondary | ICD-10-CM | POA: Diagnosis not present

## 2014-10-22 DIAGNOSIS — N183 Chronic kidney disease, stage 3 (moderate): Secondary | ICD-10-CM | POA: Diagnosis not present

## 2014-10-22 DIAGNOSIS — I480 Paroxysmal atrial fibrillation: Secondary | ICD-10-CM | POA: Diagnosis not present

## 2014-10-22 DIAGNOSIS — R296 Repeated falls: Secondary | ICD-10-CM | POA: Diagnosis not present

## 2014-10-22 DIAGNOSIS — M6281 Muscle weakness (generalized): Secondary | ICD-10-CM | POA: Diagnosis not present

## 2014-10-22 DIAGNOSIS — I2510 Atherosclerotic heart disease of native coronary artery without angina pectoris: Secondary | ICD-10-CM | POA: Diagnosis not present

## 2014-10-25 DIAGNOSIS — M6281 Muscle weakness (generalized): Secondary | ICD-10-CM | POA: Diagnosis not present

## 2014-10-25 DIAGNOSIS — R296 Repeated falls: Secondary | ICD-10-CM | POA: Diagnosis not present

## 2014-10-25 DIAGNOSIS — I129 Hypertensive chronic kidney disease with stage 1 through stage 4 chronic kidney disease, or unspecified chronic kidney disease: Secondary | ICD-10-CM | POA: Diagnosis not present

## 2014-10-25 DIAGNOSIS — I2510 Atherosclerotic heart disease of native coronary artery without angina pectoris: Secondary | ICD-10-CM | POA: Diagnosis not present

## 2014-10-25 DIAGNOSIS — N183 Chronic kidney disease, stage 3 (moderate): Secondary | ICD-10-CM | POA: Diagnosis not present

## 2014-10-25 DIAGNOSIS — I480 Paroxysmal atrial fibrillation: Secondary | ICD-10-CM | POA: Diagnosis not present

## 2014-11-03 ENCOUNTER — Inpatient Hospital Stay (HOSPITAL_BASED_OUTPATIENT_CLINIC_OR_DEPARTMENT_OTHER)
Admission: EM | Admit: 2014-11-03 | Discharge: 2014-11-07 | DRG: 291 | Disposition: A | Discharge status: Skilled Nursing Facility | Payer: Medicare Other | Attending: Internal Medicine | Admitting: Internal Medicine

## 2014-11-03 ENCOUNTER — Encounter (HOSPITAL_BASED_OUTPATIENT_CLINIC_OR_DEPARTMENT_OTHER): Payer: Self-pay | Admitting: Emergency Medicine

## 2014-11-03 ENCOUNTER — Emergency Department (HOSPITAL_BASED_OUTPATIENT_CLINIC_OR_DEPARTMENT_OTHER): Payer: Medicare Other

## 2014-11-03 DIAGNOSIS — K219 Gastro-esophageal reflux disease without esophagitis: Secondary | ICD-10-CM | POA: Diagnosis not present

## 2014-11-03 DIAGNOSIS — I480 Paroxysmal atrial fibrillation: Secondary | ICD-10-CM | POA: Diagnosis present

## 2014-11-03 DIAGNOSIS — Z8673 Personal history of transient ischemic attack (TIA), and cerebral infarction without residual deficits: Secondary | ICD-10-CM | POA: Diagnosis not present

## 2014-11-03 DIAGNOSIS — D696 Thrombocytopenia, unspecified: Secondary | ICD-10-CM | POA: Diagnosis present

## 2014-11-03 DIAGNOSIS — Z66 Do not resuscitate: Secondary | ICD-10-CM | POA: Diagnosis present

## 2014-11-03 DIAGNOSIS — I10 Essential (primary) hypertension: Secondary | ICD-10-CM | POA: Diagnosis not present

## 2014-11-03 DIAGNOSIS — Z87891 Personal history of nicotine dependence: Secondary | ICD-10-CM | POA: Diagnosis not present

## 2014-11-03 DIAGNOSIS — I129 Hypertensive chronic kidney disease with stage 1 through stage 4 chronic kidney disease, or unspecified chronic kidney disease: Secondary | ICD-10-CM | POA: Diagnosis present

## 2014-11-03 DIAGNOSIS — Z950 Presence of cardiac pacemaker: Secondary | ICD-10-CM

## 2014-11-03 DIAGNOSIS — E039 Hypothyroidism, unspecified: Secondary | ICD-10-CM | POA: Diagnosis present

## 2014-11-03 DIAGNOSIS — I509 Heart failure, unspecified: Secondary | ICD-10-CM | POA: Diagnosis not present

## 2014-11-03 DIAGNOSIS — I252 Old myocardial infarction: Secondary | ICD-10-CM

## 2014-11-03 DIAGNOSIS — N189 Chronic kidney disease, unspecified: Secondary | ICD-10-CM | POA: Diagnosis present

## 2014-11-03 DIAGNOSIS — Z888 Allergy status to other drugs, medicaments and biological substances status: Secondary | ICD-10-CM | POA: Diagnosis not present

## 2014-11-03 DIAGNOSIS — I714 Abdominal aortic aneurysm, without rupture, unspecified: Secondary | ICD-10-CM | POA: Diagnosis present

## 2014-11-03 DIAGNOSIS — D539 Nutritional anemia, unspecified: Secondary | ICD-10-CM | POA: Diagnosis present

## 2014-11-03 DIAGNOSIS — I5031 Acute diastolic (congestive) heart failure: Secondary | ICD-10-CM | POA: Diagnosis not present

## 2014-11-03 DIAGNOSIS — R278 Other lack of coordination: Secondary | ICD-10-CM | POA: Diagnosis not present

## 2014-11-03 DIAGNOSIS — L8990 Pressure ulcer of unspecified site, unspecified stage: Secondary | ICD-10-CM | POA: Diagnosis present

## 2014-11-03 DIAGNOSIS — I255 Ischemic cardiomyopathy: Secondary | ICD-10-CM | POA: Diagnosis not present

## 2014-11-03 DIAGNOSIS — J449 Chronic obstructive pulmonary disease, unspecified: Secondary | ICD-10-CM | POA: Diagnosis not present

## 2014-11-03 DIAGNOSIS — I2510 Atherosclerotic heart disease of native coronary artery without angina pectoris: Secondary | ICD-10-CM | POA: Diagnosis not present

## 2014-11-03 DIAGNOSIS — E440 Moderate protein-calorie malnutrition: Secondary | ICD-10-CM | POA: Diagnosis not present

## 2014-11-03 DIAGNOSIS — M6281 Muscle weakness (generalized): Secondary | ICD-10-CM | POA: Diagnosis not present

## 2014-11-03 DIAGNOSIS — Z79899 Other long term (current) drug therapy: Secondary | ICD-10-CM

## 2014-11-03 DIAGNOSIS — I259 Chronic ischemic heart disease, unspecified: Secondary | ICD-10-CM | POA: Diagnosis not present

## 2014-11-03 DIAGNOSIS — Z681 Body mass index (BMI) 19 or less, adult: Secondary | ICD-10-CM

## 2014-11-03 DIAGNOSIS — I5033 Acute on chronic diastolic (congestive) heart failure: Principal | ICD-10-CM | POA: Diagnosis present

## 2014-11-03 DIAGNOSIS — R531 Weakness: Secondary | ICD-10-CM | POA: Diagnosis not present

## 2014-11-03 DIAGNOSIS — I429 Cardiomyopathy, unspecified: Secondary | ICD-10-CM | POA: Diagnosis not present

## 2014-11-03 DIAGNOSIS — E43 Unspecified severe protein-calorie malnutrition: Secondary | ICD-10-CM | POA: Diagnosis present

## 2014-11-03 DIAGNOSIS — Z96649 Presence of unspecified artificial hip joint: Secondary | ICD-10-CM | POA: Diagnosis present

## 2014-11-03 DIAGNOSIS — R2681 Unsteadiness on feet: Secondary | ICD-10-CM | POA: Diagnosis not present

## 2014-11-03 DIAGNOSIS — Z7902 Long term (current) use of antithrombotics/antiplatelets: Secondary | ICD-10-CM | POA: Diagnosis not present

## 2014-11-03 DIAGNOSIS — I4819 Other persistent atrial fibrillation: Secondary | ICD-10-CM | POA: Diagnosis present

## 2014-11-03 DIAGNOSIS — I495 Sick sinus syndrome: Secondary | ICD-10-CM | POA: Diagnosis present

## 2014-11-03 DIAGNOSIS — I110 Hypertensive heart disease with heart failure: Secondary | ICD-10-CM | POA: Diagnosis present

## 2014-11-03 DIAGNOSIS — D638 Anemia in other chronic diseases classified elsewhere: Secondary | ICD-10-CM | POA: Diagnosis not present

## 2014-11-03 LAB — URINALYSIS, ROUTINE W REFLEX MICROSCOPIC
BILIRUBIN URINE: NEGATIVE
Glucose, UA: NEGATIVE mg/dL
HGB URINE DIPSTICK: NEGATIVE
KETONES UR: NEGATIVE mg/dL
Leukocytes, UA: NEGATIVE
Nitrite: NEGATIVE
PH: 7 (ref 5.0–8.0)
Protein, ur: NEGATIVE mg/dL
SPECIFIC GRAVITY, URINE: 1.009 (ref 1.005–1.030)
Urobilinogen, UA: 0.2 mg/dL (ref 0.0–1.0)

## 2014-11-03 LAB — COMPREHENSIVE METABOLIC PANEL
ALK PHOS: 89 U/L (ref 38–126)
ALT: 16 U/L (ref 14–54)
AST: 33 U/L (ref 15–41)
Albumin: 3.2 g/dL — ABNORMAL LOW (ref 3.5–5.0)
Anion gap: 9 (ref 5–15)
BUN: 25 mg/dL — AB (ref 6–20)
CALCIUM: 8.7 mg/dL — AB (ref 8.9–10.3)
CHLORIDE: 101 mmol/L (ref 101–111)
CO2: 33 mmol/L — ABNORMAL HIGH (ref 22–32)
CREATININE: 1.48 mg/dL — AB (ref 0.44–1.00)
GFR, EST AFRICAN AMERICAN: 33 mL/min — AB (ref >60)
GFR, EST NON AFRICAN AMERICAN: 29 mL/min — AB (ref >60)
Glucose, Bld: 95 mg/dL (ref 65–99)
Potassium: 4 mmol/L (ref 3.5–5.1)
Sodium: 143 mmol/L (ref 135–145)
Total Bilirubin: 0.4 mg/dL (ref 0.3–1.2)
Total Protein: 6.3 g/dL — ABNORMAL LOW (ref 6.5–8.1)

## 2014-11-03 LAB — CBC WITH DIFFERENTIAL/PLATELET
BASOS ABS: 0 10*3/uL (ref 0.0–0.1)
BASOS PCT: 0 % (ref 0–1)
EOS PCT: 3 % (ref 0–5)
Eosinophils Absolute: 0.2 10*3/uL (ref 0.0–0.7)
HEMATOCRIT: 38.3 % (ref 36.0–46.0)
Hemoglobin: 11.7 g/dL — ABNORMAL LOW (ref 12.0–15.0)
LYMPHS PCT: 26 % (ref 12–46)
Lymphs Abs: 1.7 10*3/uL (ref 0.7–4.0)
MCH: 32.2 pg (ref 26.0–34.0)
MCHC: 30.5 g/dL (ref 30.0–36.0)
MCV: 105.5 fL — AB (ref 78.0–100.0)
MONO ABS: 0.9 10*3/uL (ref 0.1–1.0)
MONOS PCT: 14 % — AB (ref 3–12)
NEUTROS ABS: 3.8 10*3/uL (ref 1.7–7.7)
Neutrophils Relative %: 57 % (ref 43–77)
PLATELETS: 133 10*3/uL — AB (ref 150–400)
RBC: 3.63 MIL/uL — ABNORMAL LOW (ref 3.87–5.11)
RDW: 14.5 % (ref 11.5–15.5)
WBC: 6.6 10*3/uL (ref 4.0–10.5)

## 2014-11-03 LAB — BRAIN NATRIURETIC PEPTIDE: B NATRIURETIC PEPTIDE 5: 377.4 pg/mL — AB (ref 0.0–100.0)

## 2014-11-03 LAB — TROPONIN I

## 2014-11-03 MED ORDER — FUROSEMIDE 10 MG/ML IJ SOLN
20.0000 mg | Freq: Once | INTRAMUSCULAR | Status: AC
  Administered 2014-11-03: 20 mg via INTRAVENOUS
  Filled 2014-11-03: qty 2

## 2014-11-03 NOTE — ED Notes (Signed)
Family at bedside. 

## 2014-11-03 NOTE — ED Notes (Signed)
Patient transported to X-ray via Biomedical scientist, SR X2 up

## 2014-11-03 NOTE — ED Notes (Addendum)
carelink here, no changes, alert, NAD, calm, interactive, no dyspnea noted while at rest, VSS, family x2 at [facility].

## 2014-11-03 NOTE — ED Notes (Signed)
Pt presents with SOB, denies any CP

## 2014-11-03 NOTE — ED Notes (Signed)
Per daughter patient c/o shortness of breath x 1 week.  Denies fever.  Reports generalized weakness, difficulty with ambulation.  Daughter reports after given appetite stimulant (Remeron she has been having difficulty walking.  Daughter states they discontinued this medication.

## 2014-11-03 NOTE — ED Notes (Signed)
Pt has DNR paperwork, brought to ED by her daughter, who stated that she wants the DNR fully enforced

## 2014-11-03 NOTE — ED Provider Notes (Signed)
CSN: [id number]   Arrival date & time 11/03/14 1754  History  This chart was scribed for Brandy Speak, MD by Altamease Oiler, ED Scribe. This patient was seen in room MH01/MH01 and the patient's care was started at 6:21 PM.  Chief Complaint  Patient presents with  . Shortness of Breath    HPI The history is provided by the patient and a relative. No language interpreter was used.   [name] is a 81 y.o. female with PMHx of TIA, HTN, CAD, MI, a-fib, COPD, asthma,  AAA, hypothyroidism, and chronic renal failure who presents to the Emergency Department with her daughters complaining of increasing SOB with gradual onset 1 week ago. She is not on oxygen at home. Per family the pt has been sleeping more than usual, eating less, losing weight, and having more difficulty with walking due to weakness for 1 month. The difficulty walking seemed to be worse while on Remeron so her daughters discontinued it. She had similar weakness several months ago with dehydration. Her family states that she "just hasn't been the same" since her Lasix was decreased.  Pt denies fever, cough, chest pain, back pain, and abdominal pain. 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 Primary care at [facility]   Past Medical History  Diagnosis Date  . GI bleed 2007  . Hypertension   . TIA (transient ischemic attack)   . Hypothyroidism   . Abdominal aortic aneurysm   . CAD (coronary artery disease)   . SSS (sick sinus syndrome) 10/05/2007    Medtronic Adapta  . Myocardial infarction   . Atrial fibrillation   . COPD (chronic obstructive pulmonary disease)   . Asthma     hx  . Anemia     hx  . Blood transfusion   . UTI (lower urinary tract infection)     hx  . Renal failure, chronic   . H/O hiatal hernia   . GERD (gastroesophageal reflux disease)   . Stroke   . Osteoporosis   . Blood transfusion without reported diagnosis   . Pacemaker     Past Surgical History  Procedure Laterality Date  .  Pacemaker insertion  10/05/2007    Medtronic Adapta  . Coronary stent placement      x9  . Cardiac catheterization    . Fracture surgery      femur fx, /w ORIF into HIP- 2008  . Total hip arthroplasty      planned for 08/27/2011 Left  . Total hip arthroplasty  08/27/2011    Procedure: TOTAL HIP ARTHROPLASTY;  Surgeon: Kerin Salen, MD;  Location: [place];  Service: Orthopedics;  Laterality: Right;  . US echocardiography  08/23/2011    EF 50%,LA mod. dilated,mild to mod. mitral annular ca+,mod. MR,mild to mod TR,mild to mod. PH,trace AI.  [name] Nm myocar perf wall motion  10/08/2010    mild apical anterior ischemia  . Colonoscopy      Family History  Problem Relation Age of Onset  . Emphysema Father   . Heart disease Mother   . Cancer Brother   . Cancer Brother   . Cancer Sister   . Cancer Sister   . Diabetes Daughter   . Atrial fibrillation Daughter   . Atrial fibrillation Daughter   . Stroke Daughter   . Heart Problems Son     Social History  Substance Use Topics  . Smoking status: Former Smoker    Types: Cigarettes  . Smokeless tobacco:  Never Used     Comment: quit 40 yrs ago- was smoking 2 packs a day at the most  . Alcohol Use: No     Review of Systems 10 Systems reviewed and all are negative for acute change except as noted in the HPI.  Home Medications   Prior to Admission medications   Medication Sig Start Date End Date Taking? Authorizing Provider  amiodarone (PACERONE) 200 MG tablet TAKE 1 TABLET BY MOUTH DAILY. 08/05/14   Mihai Croitoru, MD  Biotin 1000 MCG tablet Take 1,000 mcg by mouth daily.    Historical Provider, MD  Calcium Carb-Cholecalciferol (CALCIUM 600 + D PO) Take 1 tablet by mouth daily.    Historical Provider, MD  clopidogrel (PLAVIX) 75 MG tablet Take 1 tablet (75 mg total) by mouth daily. 07/01/14   Mihai Croitoru, MD  cycloSPORINE (RESTASIS) 0.05 % ophthalmic emulsion Place 1 drop into both eyes 2 (two) times daily.    Historical Provider, MD   doxycycline (VIBRA-TABS) 100 MG tablet Take 1 tablet (100 mg total) by mouth 2 (two) times daily. 10/15/14   Lauree Chandler, NP  feeding supplement (BOOST HIGH PROTEIN) LIQD Take 1 Container by mouth daily.    Historical Provider, MD  ferrous sulfate 325 (65 FE) MG tablet Take 325 mg by mouth daily with breakfast.     Historical Provider, MD  furosemide (LASIX) 20 MG tablet Take 1 tablet (20 mg total) by mouth daily. M/W/F 10/15/14   Lauree Chandler, NP  levothyroxine (SYNTHROID, LEVOTHROID) 125 MCG tablet TAKE 1 TABLET BY MOUTH EVERY DAY 10/30/13   Lorretta Harp, MD  metoprolol succinate (TOPROL-XL) 25 MG 24 hr tablet Take 12.5 mg by mouth daily.    Historical Provider, MD  mirtazapine (REMERON) 15 MG tablet Take 1 tablet (15 mg total) by mouth at bedtime. For appetite 10/15/14   Lauree Chandler, NP  nitroGLYCERIN (NITROSTAT) 0.4 MG SL tablet Place 1 tablet (0.4 mg total) under the tongue every 5 (five) minutes as needed for chest pain (For a total of 3 tablets, if chest pain persist to call 911). 10/15/14   Lauree Chandler, NP  pantoprazole (PROTONIX) 40 MG tablet Take 1 tablet (40 mg total) by mouth daily. 08/01/14   Lauree Chandler, NP  ranitidine (ZANTAC) 300 MG tablet Take 300 mg by mouth at bedtime.     Historical Provider, MD  vitamin B-12 (CYANOCOBALAMIN) 1000 MCG tablet Take 1,000 mcg by mouth daily.    Historical Provider, MD    Allergies  Nubain; Statins; and Iodine  Triage Vitals: BP 128/61 mmHg  Pulse 80  Temp(Src) 98.2 F (36.8 C) (Oral)  Resp 16  Wt 112 lb (50.803 kg)  SpO2 100%  Physical Exam  Constitutional: She is oriented to person, place, and time. She appears well-developed and well-nourished. No distress.  HENT:  Head: Normocephalic and atraumatic.  Eyes: EOM are normal.  Neck: Normal range of motion.  Cardiovascular: Normal rate and regular rhythm.   Murmur heard. Pulmonary/Chest: Effort normal. She has rales.  There were slight rales in the bases  bilaterally  Abdominal: Soft. She exhibits no distension. There is no tenderness.  Musculoskeletal: Normal range of motion. She exhibits edema.  2+ BLE edema  Neurological: She is alert and oriented to person, place, and time.  Skin: Skin is warm and dry.  Psychiatric: She has a normal mood and affect. Judgment normal.  Nursing note and vitals reviewed.    ED Course  Procedures  DIAGNOSTIC STUDIES: Oxygen Saturation is 100% on 2 L [place], adequate by my interpretation.    COORDINATION OF CARE: 6:29 PM Discussed treatment plan which includes lab work, CXR, and EKG with pt and her family at bedside and they agreed to the plan.  10:52 PM-Consult complete with Dr. Hal Hope (Hospitalist). Patient case explained and discussed. He agrees to admit patient for further evaluation and treatment. Call ended at 10:55 PM  Labs Reviewed  COMPREHENSIVE METABOLIC PANEL - Abnormal; Notable for the following:    CO2 33 (*)    BUN 25 (*)    Creatinine, Ser 1.48 (*)    Calcium 8.7 (*)    Total Protein 6.3 (*)    Albumin 3.2 (*)    GFR calc non Af Amer 29 (*)    GFR calc Af Amer 33 (*)    All other components within normal limits  BRAIN NATRIURETIC PEPTIDE - Abnormal; Notable for the following:    B Natriuretic Peptide 377.4 (*)    All other components within normal limits  CBC WITH DIFFERENTIAL/PLATELET - Abnormal; Notable for the following:    RBC 3.63 (*)    Hemoglobin 11.7 (*)    MCV 105.5 (*)    Platelets 133 (*)    Monocytes Relative 14 (*)    All other components within normal limits  TROPONIN I  URINALYSIS, ROUTINE W REFLEX MICROSCOPIC (NOT AT [facility])    Imaging Review Dg Chest 2 View  11/03/2014   CLINICAL DATA:  81 year old with steadily worsening shortness of breath that began approximately 1 week ago.  EXAM: CHEST  2 VIEW  COMPARISON:  06/19/2014 and earlier.  FINDINGS: Cardiac silhouette markedly enlarged. Pulmonary venous hypertension and mild interstitial pulmonary edema  superimposed upon COPD. Bilateral pleural effusions and associated consolidation in the lower lobes. Left subclavian dual lead transvenous pacemaker unchanged and appears intact.  IMPRESSION: 1. Mild CHF superimposed upon COPD. 2. Bilateral pleural effusions with associated passive atelectasis in the lower lobes.   Electronically Signed   By: Evangeline Dakin M.D.   On: 11/03/2014 19:13    ED ECG REPORT   Date: 11/03/2014  Rate: 78  Rhythm: Atrial paced  QRS Axis: indeterminate  Intervals: normal  ST/T Wave abnormalities: indeterminate  Conduction Disutrbances:none  Narrative Interpretation:   Old EKG Reviewed: none available  I have personally reviewed the EKG tracing and agree with the computerized printout as noted.       MDM   Final diagnoses:  Weakness  CHF exacerbation     Patient presents with complaints of weakness and difficulty breathing. Her workup reveals an elevated BNP and evidence for CHF on her chest x-ray. She's been off her Lasix for several weeks and I suspect this is the cause of her symptoms. She was given IV Lasix here in the ER and will be admitted to [facility] under the care of Dr. Hal Hope.   I personally performed the services described in this documentation, which was scribed in my presence. The recorded information has been reviewed and is accurate.     Brandy Speak, MD 11/03/14 2258

## 2014-11-03 NOTE — ED Notes (Signed)
Immediately placed on cardiac monitor, IV established, ECG done, lab tubes obtained

## 2014-11-03 NOTE — ED Notes (Signed)
Hearing Aid in [place] upon arrival to ED

## 2014-11-03 NOTE — ED Notes (Signed)
Attempted report to [facility] 2W

## 2014-11-03 NOTE — ED Notes (Signed)
Pt alert, NAD, calm, interactive, resps e/u, speaking in clear complete sentences, no changes, (denies while resting in bed: pain, sob, nausea or dizziness), updated or wait, plan and process.

## 2014-11-04 ENCOUNTER — Ambulatory Visit: Payer: Medicare Other | Admitting: *Deleted

## 2014-11-04 ENCOUNTER — Telehealth: Payer: Self-pay | Admitting: Cardiology

## 2014-11-04 ENCOUNTER — Inpatient Hospital Stay (HOSPITAL_COMMUNITY): Payer: Medicare Other

## 2014-11-04 DIAGNOSIS — E039 Hypothyroidism, unspecified: Secondary | ICD-10-CM

## 2014-11-04 DIAGNOSIS — I495 Sick sinus syndrome: Secondary | ICD-10-CM

## 2014-11-04 DIAGNOSIS — D696 Thrombocytopenia, unspecified: Secondary | ICD-10-CM

## 2014-11-04 DIAGNOSIS — I509 Heart failure, unspecified: Secondary | ICD-10-CM

## 2014-11-04 DIAGNOSIS — D539 Nutritional anemia, unspecified: Secondary | ICD-10-CM

## 2014-11-04 DIAGNOSIS — I2510 Atherosclerotic heart disease of native coronary artery without angina pectoris: Secondary | ICD-10-CM

## 2014-11-04 DIAGNOSIS — I255 Ischemic cardiomyopathy: Secondary | ICD-10-CM

## 2014-11-04 DIAGNOSIS — I714 Abdominal aortic aneurysm, without rupture: Secondary | ICD-10-CM

## 2014-11-04 DIAGNOSIS — I110 Hypertensive heart disease with heart failure: Secondary | ICD-10-CM | POA: Diagnosis present

## 2014-11-04 DIAGNOSIS — R531 Weakness: Secondary | ICD-10-CM

## 2014-11-04 DIAGNOSIS — I5031 Acute diastolic (congestive) heart failure: Secondary | ICD-10-CM | POA: Diagnosis present

## 2014-11-04 DIAGNOSIS — I10 Essential (primary) hypertension: Secondary | ICD-10-CM

## 2014-11-04 LAB — GLUCOSE, CAPILLARY: GLUCOSE-CAPILLARY: 74 mg/dL (ref 65–99)

## 2014-11-04 MED ORDER — ACETAMINOPHEN 325 MG PO TABS: 650.0000 mg | ORAL_TABLET | ORAL | Status: DC | PRN

## 2014-11-04 MED ORDER — CLOPIDOGREL BISULFATE 75 MG PO TABS
75.0000 mg | ORAL_TABLET | Freq: Every day | ORAL | Status: DC
  Administered 2014-11-04 – 2014-11-07 (×4): 75 mg via ORAL
  Filled 2014-11-04 (×4): qty 1

## 2014-11-04 MED ORDER — FUROSEMIDE 10 MG/ML IJ SOLN
20.0000 mg | Freq: Every day | INTRAMUSCULAR | Status: DC
  Filled 2014-11-04: qty 4

## 2014-11-04 MED ORDER — BOOST HIGH PROTEIN PO LIQD: 1.0000 | Freq: Two times a day (BID) | ORAL | Status: DC

## 2014-11-04 MED ORDER — SODIUM CHLORIDE 0.9 % IJ SOLN: 3.0000 mL | INTRAMUSCULAR | Status: DC | PRN

## 2014-11-04 MED ORDER — ENOXAPARIN SODIUM 30 MG/0.3ML ~~LOC~~ SOLN
30.0000 mg | SUBCUTANEOUS | Status: DC
  Administered 2014-11-04 – 2014-11-07 (×4): 30 mg via SUBCUTANEOUS
  Filled 2014-11-04 (×4): qty 0.3

## 2014-11-04 MED ORDER — BOOST HIGH PROTEIN PO LIQD
1.0000 | Freq: Every day | ORAL | Status: DC
  Administered 2014-11-04: 237 mL via ORAL

## 2014-11-04 MED ORDER — FAMOTIDINE 20 MG PO TABS
10.0000 mg | ORAL_TABLET | Freq: Every day | ORAL | Status: DC
  Administered 2014-11-04 – 2014-11-07 (×4): 10 mg via ORAL
  Filled 2014-11-04 (×4): qty 1

## 2014-11-04 MED ORDER — ONDANSETRON HCL 4 MG/2ML IJ SOLN: 4.0000 mg | Freq: Four times a day (QID) | INTRAMUSCULAR | Status: DC | PRN

## 2014-11-04 MED ORDER — LEVOTHYROXINE SODIUM 125 MCG PO TABS
125.0000 ug | ORAL_TABLET | Freq: Every day | ORAL | Status: DC
  Administered 2014-11-04 – 2014-11-07 (×4): 125 ug via ORAL
  Filled 2014-11-04 (×4): qty 1

## 2014-11-04 MED ORDER — CALCIUM CARBONATE-VITAMIN D 500-200 MG-UNIT PO TABS
1.0000 | ORAL_TABLET | Freq: Every day | ORAL | Status: DC
  Administered 2014-11-04 – 2014-11-07 (×4): 1 via ORAL
  Filled 2014-11-04 (×7): qty 1

## 2014-11-04 MED ORDER — NITROGLYCERIN 0.4 MG SL SUBL: 0.4000 mg | SUBLINGUAL_TABLET | SUBLINGUAL | Status: DC | PRN

## 2014-11-04 MED ORDER — PANTOPRAZOLE SODIUM 40 MG PO TBEC
40.0000 mg | DELAYED_RELEASE_TABLET | Freq: Every day | ORAL | Status: DC
  Administered 2014-11-04 – 2014-11-07 (×4): 40 mg via ORAL
  Filled 2014-11-04 (×4): qty 1

## 2014-11-04 MED ORDER — FERROUS SULFATE 325 (65 FE) MG PO TABS
325.0000 mg | ORAL_TABLET | Freq: Every day | ORAL | Status: DC
  Administered 2014-11-04 – 2014-11-07 (×4): 325 mg via ORAL
  Filled 2014-11-04 (×4): qty 1

## 2014-11-04 MED ORDER — FUROSEMIDE 10 MG/ML IJ SOLN
20.0000 mg | Freq: Once | INTRAMUSCULAR | Status: DC
Start: 2014-11-04 — End: 2014-11-04

## 2014-11-04 MED ORDER — CYCLOSPORINE 0.05 % OP EMUL
1.0000 [drp] | Freq: Two times a day (BID) | OPHTHALMIC | Status: DC
  Administered 2014-11-04 – 2014-11-07 (×7): 1 [drp] via OPHTHALMIC
  Filled 2014-11-04 (×9): qty 1

## 2014-11-04 MED ORDER — METOPROLOL SUCCINATE ER 25 MG PO TB24
12.5000 mg | ORAL_TABLET | Freq: Every day | ORAL | Status: DC
  Administered 2014-11-05 – 2014-11-07 (×3): 12.5 mg via ORAL
  Filled 2014-11-04 (×4): qty 1

## 2014-11-04 MED ORDER — POTASSIUM CHLORIDE CRYS ER 10 MEQ PO TBCR
10.0000 meq | EXTENDED_RELEASE_TABLET | Freq: Every day | ORAL | Status: AC
  Administered 2014-11-04 – 2014-11-06 (×3): 10 meq via ORAL
  Filled 2014-11-04 (×3): qty 1

## 2014-11-04 MED ORDER — VITAMIN B-12 1000 MCG PO TABS
1000.0000 ug | ORAL_TABLET | Freq: Every day | ORAL | Status: DC
  Administered 2014-11-04 – 2014-11-07 (×4): 1000 ug via ORAL
  Filled 2014-11-04 (×4): qty 1

## 2014-11-04 MED ORDER — ENSURE ENLIVE PO LIQD
237.0000 mL | Freq: Two times a day (BID) | ORAL | Status: DC
  Administered 2014-11-04 – 2014-11-07 (×3): 237 mL via ORAL

## 2014-11-04 MED ORDER — AMIODARONE HCL 200 MG PO TABS
200.0000 mg | ORAL_TABLET | Freq: Every day | ORAL | Status: DC
  Administered 2014-11-04 – 2014-11-07 (×4): 200 mg via ORAL
  Filled 2014-11-04 (×4): qty 1

## 2014-11-04 MED ORDER — SODIUM CHLORIDE 0.9 % IV SOLN: 250.0000 mL | INTRAVENOUS | Status: DC | PRN

## 2014-11-04 MED ORDER — SODIUM CHLORIDE 0.9 % IJ SOLN
3.0000 mL | Freq: Two times a day (BID) | INTRAMUSCULAR | Status: DC
  Administered 2014-11-04 – 2014-11-05 (×4): 3 mL via INTRAVENOUS

## 2014-11-04 NOTE — Care Management Note (Addendum)
Case Management Note  Patient Details  Name: [name] MRN: [id number] Date of Birth: [birth year]  Subjective/Objective:      Pt admitted with acute diastolic CHF              Action/Plan:  Pt is independent from home, daughter has a home in the same yard at pt, however daughter works during the day.  Other daughter is at bedside, states he stays 45 mins away but visits pt two times a week and takes pt grocery shopping.  Pt has walker and cane in the home already with a bathroom in the bedroom.  CM will continue to monitor for disposition need.  PT/OT evaluation ordered 11/04/14.   Expected Discharge Date:  11/08/14               Expected Discharge Plan:  Home/Self Care  In-House Referral:     Discharge planning Services  CM Consult  Post Acute Care Choice:    Choice offered to:     DME Arranged:    DME Agency:     HH Arranged:    HH Agency:     Status of Service:  In process, will continue to follow  Medicare Important Message Given:  Yes-third notification given Date Medicare IM Given:    Medicare IM give by:    Date Additional Medicare IM Given:    Additional Medicare Important Message give by:     If discussed at [place] of Stay Meetings, dates discussed:    Additional Comments: Pt states she has a small scale at home that will allow her to weigh herself daily, pt stated that he adheres to a low sodium diet Maryclare Labrador, RN 11/04/2014, 10:34 AM

## 2014-11-04 NOTE — H&P (Signed)
[facility] Admission History and Physical       [name]:[id number] DOB: 1917/06/18 DOA: 11/03/2014  Referring physician: EDP PCP: Lauree Chandler, NP  Specialists:   Chief Complaint: SOB   HPI: [name] is a 81 y.o. female with a history of Diastolic CHF, CAD, PAF, SSS S/P Pacemaker, HTN, Hypothyroid who presented to the [facility] ED with complaints of worsening SOB and Lower Leg swelling x  3 weeks.  Her family is at bedside and report that  Her leg edema has been getting worse since her lasix had been recently discontinued.   The Lasix was recently restarted for every other day but her family report that it has not been working.   On evaluation she was found to have Mild CHF with bilateral effusion on Chest X-ray and she was given 20 mg of IV Lasix x 1 and referred for admission.  She denies any chest pain.      Review of Systems:  Constitutional: No Weight Loss, No Weight Gain, Night Sweats, Fevers, Chills, Dizziness, Light Headedness, Fatigue, or Generalized Weakness HEENT: No Headaches, Difficulty Swallowing,Tooth/Dental Problems,Sore Throat,  No Sneezing, Rhinitis, Ear Ache, Nasal Congestion, or Post Nasal Drip,  Cardio-vascular:  No Chest pain, Orthopnea, PND, +Edema in Lower Extremities, Anasarca, Dizziness, Palpitations  Resp: +Dyspnea, +DOE, No Productive Cough, No Non-Productive Cough, No Hemoptysis, No Wheezing.    GI: No Heartburn, Indigestion, Abdominal Pain, Nausea, Vomiting, Diarrhea, Constipation, Hematemesis, Hematochezia, Melena, Change in Bowel Habits,  Loss of Appetite  GU: No Dysuria, No Change in Color of Urine, No Urgency or Urinary Frequency, No Flank pain.  Musculoskeletal: No Joint Pain or Swelling, No Decreased Range of Motion, No Back Pain.  Neurologic: No Syncope, No Seizures, Muscle Weakness, Paresthesia, Vision Disturbance or Loss, No Diplopia, No Vertigo, No Difficulty Walking,  Skin: No Rash or Lesions. Psych: No Change in Mood or  Affect, No Depression or Anxiety, No Memory loss, No Confusion, or Hallucinations   Past Medical History  Diagnosis Date  . GI bleed 2007  . Hypertension   . TIA (transient ischemic attack)   . Hypothyroidism   . Abdominal aortic aneurysm   . CAD (coronary artery disease)   . SSS (sick sinus syndrome) 10/05/2007    Medtronic Adapta  . Myocardial infarction   . Atrial fibrillation   . COPD (chronic obstructive pulmonary disease)   . Asthma     hx  . Anemia     hx  . Blood transfusion   . UTI (lower urinary tract infection)     hx  . Renal failure, chronic   . H/O hiatal hernia   . GERD (gastroesophageal reflux disease)   . Stroke   . Osteoporosis   . Blood transfusion without reported diagnosis   . Pacemaker      Past Surgical History  Procedure Laterality Date  . Pacemaker insertion  10/05/2007    Medtronic Adapta  . Coronary stent placement      x9  . Cardiac catheterization    . Fracture surgery      femur fx, /w ORIF into HIP- 2008  . Total hip arthroplasty      planned for 08/27/2011 Left  . Total hip arthroplasty  08/27/2011    Procedure: TOTAL HIP ARTHROPLASTY;  Surgeon: Kerin Salen, MD;  Location: [place];  Service: Orthopedics;  Laterality: Right;  . US echocardiography  08/23/2011    EF 50%,LA mod. dilated,mild to mod. mitral annular ca+,mod. MR,mild  to mod TR,mild to mod. PH,trace AI.  [name] Nm myocar perf wall motion  10/08/2010    mild apical anterior ischemia  . Colonoscopy        Prior to Admission medications   Medication Sig Start Date End Date Taking? Authorizing Provider  amiodarone (PACERONE) 200 MG tablet TAKE 1 TABLET BY MOUTH DAILY. 08/05/14   Mihai Croitoru, MD  Biotin 1000 MCG tablet Take 1,000 mcg by mouth daily.    Historical Provider, MD  Calcium Carb-Cholecalciferol (CALCIUM 600 + D PO) Take 1 tablet by mouth daily.    Historical Provider, MD  clopidogrel (PLAVIX) 75 MG tablet Take 1 tablet (75 mg total) by mouth daily. 07/01/14   Mihai  Croitoru, MD  cycloSPORINE (RESTASIS) 0.05 % ophthalmic emulsion Place 1 drop into both eyes 2 (two) times daily.    Historical Provider, MD  doxycycline (VIBRA-TABS) 100 MG tablet Take 1 tablet (100 mg total) by mouth 2 (two) times daily. 10/15/14   Lauree Chandler, NP  feeding supplement (BOOST HIGH PROTEIN) LIQD Take 1 Container by mouth daily.    Historical Provider, MD  ferrous sulfate 325 (65 FE) MG tablet Take 325 mg by mouth daily with breakfast.     Historical Provider, MD  furosemide (LASIX) 20 MG tablet Take 1 tablet (20 mg total) by mouth daily. M/W/F 10/15/14   Lauree Chandler, NP  levothyroxine (SYNTHROID, LEVOTHROID) 125 MCG tablet TAKE 1 TABLET BY MOUTH EVERY DAY 10/30/13   Lorretta Harp, MD  metoprolol succinate (TOPROL-XL) 25 MG 24 hr tablet Take 12.5 mg by mouth daily.    Historical Provider, MD  mirtazapine (REMERON) 15 MG tablet Take 1 tablet (15 mg total) by mouth at bedtime. For appetite 10/15/14   Lauree Chandler, NP  nitroGLYCERIN (NITROSTAT) 0.4 MG SL tablet Place 1 tablet (0.4 mg total) under the tongue every 5 (five) minutes as needed for chest pain (For a total of 3 tablets, if chest pain persist to call 911). 10/15/14   Lauree Chandler, NP  pantoprazole (PROTONIX) 40 MG tablet Take 1 tablet (40 mg total) by mouth daily. 08/01/14   Lauree Chandler, NP  ranitidine (ZANTAC) 300 MG tablet Take 300 mg by mouth at bedtime.     Historical Provider, MD  vitamin B-12 (CYANOCOBALAMIN) 1000 MCG tablet Take 1,000 mcg by mouth daily.    Historical Provider, MD     Allergies  Allergen Reactions  . Nubain [Nalbuphine Hcl] Anaphylaxis  . Statins     Leg weakness  . Iodine Rash    Social History:   Walks with a Environmental consultant, Able to perform all of her ADLs, Family lives Next Door.  reports that she has quit smoking. Her smoking use included Cigarettes. She has never used smokeless tobacco. She reports that she does not drink alcohol or use illicit drugs.    Family History    Problem Relation Age of Onset  . Emphysema Father   . Heart disease Mother   . Cancer Brother   . Cancer Brother   . Cancer Sister   . Cancer Sister   . Diabetes Daughter   . Atrial fibrillation Daughter   . Atrial fibrillation Daughter   . Stroke Daughter   . Heart Problems Son        Physical Exam:  GEN:  Pleasant Elderly Obese 81 y.o. Caucasian female examined and in no acute distress; cooperative with exam Filed Vitals:   11/03/14 2230 11/03/14 2300 11/03/14 2330 11/04/14  0035  BP: 137/58 145/63 149/62 148/86  Pulse: 80  76 81  Temp:    97.5 F (36.4 C)  TempSrc:    Oral  Resp: 16 21 19 19   Weight:      SpO2: 95%  96% 98%   Blood pressure 148/86, pulse 81, temperature 97.5 F (36.4 C), temperature source Oral, resp. rate 19, weight 50.803 kg (112 lb), SpO2 98 %. PSYCH: She is alert and oriented x4; does not appear anxious does not appear depressed; affect is normal HEENT: Normocephalic and Atraumatic, Mucous membranes pink; PERRLA; EOM intact; Fundi:  Benign;  No scleral icterus, Nares: Patent, Oropharynx: Clear,     Neck:  FROM, No Cervical Lymphadenopathy nor Thyromegaly or Carotid Bruit; No JVD; Breasts:: Not examined CHEST WALL: No tenderness CHEST: Normal respiration, clear to auscultation bilaterally HEART: Regular rate and rhythm; no murmurs rubs or gallops BACK: No kyphosis or scoliosis; No CVA tenderness ABDOMEN: Positive Bowel Sounds, Obese, Soft Non-Tender, No Rebound or Guarding; No Masses, No Organomegaly, No Pannus; No Intertriginous candida. Rectal Exam: Not done EXTREMITIES: No Cyanosis, Clubbing, 2+Edema in BLEs; No Ulcerations. Genitalia: not examined PULSES: 2+ and symmetric SKIN: Normal hydration no rash or ulceration CNS:  Alert and Oriented x 4, No Focal Deficits except HOH,  And Generalized Weakness Vascular: pulses palpable throughout    Labs on Admission:  Basic Metabolic Panel:  Recent Labs Lab 11/03/14 1807  NA 143  K 4.0  CL  101  CO2 33*  GLUCOSE 95  BUN 25*  CREATININE 1.48*  CALCIUM 8.7*   Liver Function Tests:  Recent Labs Lab 11/03/14 1807  AST 33  ALT 16  ALKPHOS 89  BILITOT 0.4  PROT 6.3*  ALBUMIN 3.2*   No results for input(s): LIPASE, AMYLASE in the last 168 hours. No results for input(s): AMMONIA in the last 168 hours. CBC:  Recent Labs Lab 11/03/14 1807  WBC 6.6  NEUTROABS 3.8  HGB 11.7*  HCT 38.3  MCV 105.5*  PLT 133*   Cardiac Enzymes:  Recent Labs Lab 11/03/14 1807  TROPONINI <0.03    BNP (last 3 results)  Recent Labs  11/03/14 1807  BNP 377.4*    ProBNP (last 3 results) No results for input(s): PROBNP in the last 8760 hours.  CBG: No results for input(s): GLUCAP in the last 168 hours.  Radiological Exams on Admission: Dg Chest 2 View  11/03/2014   CLINICAL DATA:  81 year old with steadily worsening shortness of breath that began approximately 1 week ago.  EXAM: CHEST  2 VIEW  COMPARISON:  06/19/2014 and earlier.  FINDINGS: Cardiac silhouette markedly enlarged. Pulmonary venous hypertension and mild interstitial pulmonary edema superimposed upon COPD. Bilateral pleural effusions and associated consolidation in the lower lobes. Left subclavian dual lead transvenous pacemaker unchanged and appears intact.  IMPRESSION: 1. Mild CHF superimposed upon COPD. 2. Bilateral pleural effusions with associated passive atelectasis in the lower lobes.   Electronically Signed   By: Evangeline Dakin M.D.   On: 11/03/2014 19:13     EKG: Independently reviewed. Atrial paced rate at 76   Assessment/Plan:   81 y.o. female with  Principal Problem:   1.    Acute diastolic CHF (congestive heart failure)   Active Problems:   2.    Weakness     3.    Paroxysmal atrial fibrillation   Continue Amiodarone  And Metoprolol Rx     4.    CAD (coronary artery disease)   Continue Metoprolol,  5.    Hypertension   Continue Metoprolol and Lasix Rx   Monitor BPs     6.     SSS (sick sinus syndrome)- S/P Pacemaker         7.    Hypothyroid   Continue Levothyroxine      8.    Thrombocytopenia- PLTs = 133   Monitor     9.    AAA (abdominal aortic aneurysm)- Inoperable   stable   10.    DVT Prophylaxis   Lovenox     Code Status:     DO NOT RESUSCITATE (DNR)      Family Communication:   Family at Bedside    Disposition Plan:    Inpatient  Status        Time spent:  [place] [facility] Pager [phone]   If [place] Please Contact the Day Rounding Team MD for [facility]  If 7PM-7AM, Please Contact Night-Floor Coverage  [email or web address] Password [email or web address] 11/04/2014, 1:37 AM     ADDENDUM:   Patient was seen and examined on 11/04/2014

## 2014-11-04 NOTE — Care Management Important Message (Signed)
Important Message  Patient Details  Name: [name] MRN: [id number] Date of Birth: July 04, 1917   Medicare Important Message Given:  Yes-third notification given    Maryclare Labrador, RN 11/04/2014, 10:33 AM

## 2014-11-04 NOTE — Progress Notes (Signed)
[facility] PROGRESS NOTE Assessment/Plan: Acute diastolic CHF (congestive heart failure)/ischemic cardiomyopathy: - I agree with IV Lasix on admission, was continue metoprolol. - I have personally reviewed the chest x-ray shows mild interstitial markings, with an elevated BNP of and concern about pulmonary edema. - Continue strict I's and O's daily weights monitor electrolytes. - 2-D echo is pending.  Generalized Weakness: Due to above. Consults physical therapy  Essential Hypertension: - Seems to be at goal.  Paroxysmal atrial fibrillation: - Atrially paced rhythm with a prolonged PR, on amiodarone.  Hypothyroid Continue Synthroid.  Chronic mild Thrombocytopenia - Stable.    SSS (sick sinus syndrome)  Code Status: DNR Family Communication: none  Disposition Plan: inpatient   Consultants:  none  Procedures:  CXR  ECHO  Antibiotics:  None  HPI/Subjective: She relates she continues to be weak, her shortness of breath is much improved.  Objective: Filed Vitals:   11/03/14 2300 11/03/14 2330 11/04/14 0035 11/04/14 0521  BP: 145/63 149/62 148/86 126/66  Pulse:  76 81 85  Temp:   97.5 F (36.4 C) 97.5 F (36.4 C)  TempSrc:   Oral Oral  Resp: 21 19 19 19   Weight:    46.584 kg (102 lb 11.2 oz)  SpO2:  96% 98% 97%    Intake/Output Summary (Last 24 hours) at 11/04/14 0810 Last data filed at 11/04/14 0559  Gross per 24 hour  Intake      0 ml  Output   1650 ml  Net  -1650 ml   Filed Weights   11/03/14 1803 11/04/14 0521  Weight: 50.803 kg (112 lb) 46.584 kg (102 lb 11.2 oz)    Exam:  General: Alert, awake, oriented x3, in no acute distress.  HEENT: No bruits, no goiter. +HJ reflex. Heart: Regular rate and rhythm. Lungs: Good air movement,clear  Abdomen: Soft, nontender, nondistended, positive bowel sounds.  Neuro: Grossly intact, nonfocal.   Data Reviewed: Basic Metabolic Panel:  Recent Labs Lab 11/03/14 1807  NA 143  K 4.0    CL 101  CO2 33*  GLUCOSE 95  BUN 25*  CREATININE 1.48*  CALCIUM 8.7*   Liver Function Tests:  Recent Labs Lab 11/03/14 1807  AST 33  ALT 16  ALKPHOS 89  BILITOT 0.4  PROT 6.3*  ALBUMIN 3.2*   No results for input(s): LIPASE, AMYLASE in the last 168 hours. No results for input(s): AMMONIA in the last 168 hours. CBC:  Recent Labs Lab 11/03/14 1807  WBC 6.6  NEUTROABS 3.8  HGB 11.7*  HCT 38.3  MCV 105.5*  PLT 133*   Cardiac Enzymes:  Recent Labs Lab 11/03/14 1807  TROPONINI <0.03   BNP (last 3 results)  Recent Labs  11/03/14 1807  BNP 377.4*    ProBNP (last 3 results) No results for input(s): PROBNP in the last 8760 hours.  CBG: No results for input(s): GLUCAP in the last 168 hours.  No results found for this or any previous visit (from the past 240 hour(s)).   Studies: Dg Chest 2 View  11/03/2014   CLINICAL DATA:  81 year old with steadily worsening shortness of breath that began approximately 1 week ago.  EXAM: CHEST  2 VIEW  COMPARISON:  06/19/2014 and earlier.  FINDINGS: Cardiac silhouette markedly enlarged. Pulmonary venous hypertension and mild interstitial pulmonary edema superimposed upon COPD. Bilateral pleural effusions and associated consolidation in the lower lobes. Left subclavian dual lead transvenous pacemaker unchanged and appears intact.  IMPRESSION: 1. Mild CHF superimposed upon COPD. 2.  Bilateral pleural effusions with associated passive atelectasis in the lower lobes.   Electronically Signed   By: Evangeline Dakin M.D.   On: 11/03/2014 19:13    Scheduled Meds: . amiodarone  200 mg Oral Daily  . calcium-vitamin D  1 tablet Oral Daily  . clopidogrel  75 mg Oral Daily  . cycloSPORINE  1 drop Both Eyes BID  . enoxaparin (LOVENOX) injection  30 mg Subcutaneous Q24H  . famotidine  10 mg Oral Daily  . feeding supplement  1 Container Oral Daily  . ferrous sulfate  325 mg Oral Q breakfast  . furosemide  20 mg Intravenous Daily  .  levothyroxine  125 mcg Oral QAC breakfast  . metoprolol succinate  12.5 mg Oral Daily  . pantoprazole  40 mg Oral Daily  . potassium chloride  10 mEq Oral Daily  . sodium chloride  3 mL Intravenous Q12H  . vitamin B-12  1,000 mcg Oral Daily   Continuous Infusions:   Time Spent: 25 min   Charlynne Cousins  [facility] Pager [phone]. If 7PM-7AM, please contact night-coverage at [email or web address], password [email or web address] 11/04/2014, 8:10 AM  LOS: 1 day

## 2014-11-04 NOTE — Progress Notes (Signed)
Initial Nutrition Assessment  DOCUMENTATION CODES:   Severe malnutrition in context of chronic illness  INTERVENTION:   Provide Ensure Enlive po BID, each supplement provides 350 kcal and 20 grams of protein.  Encourage adequate PO intake.   NUTRITION DIAGNOSIS:   Malnutrition related to chronic illness as evidenced by energy intake < or equal to 75% for > or equal to 1 month, severe depletion of muscle mass.  GOAL:   Patient will meet greater than or equal to 90% of their needs  MONITOR:   PO intake, Supplement acceptance, Weight trends, I & O's, Labs  REASON FOR ASSESSMENT:   Malnutrition Screening Tool    ASSESSMENT:   81 y.o. female with a history of Diastolic CHF, CAD, PAF, SSS S/P Pacemaker, HTN, Hypothyroid who presented to the [facility] ED with complaints of worsening SOB and Lower Leg swelling x 3 weeks.  Pt reports having a decreased appetite which have been ongoing over a year. Family at bedside reports pt only eats a couple bites out of foods at meals due to early satiety. Current meal completion is 50%. Usual body weight reported to be ~112 lbs. Question current weight accuracy? Pt usually consumes Boost at home at least once daily. Current pt has Boost ordered once daily, RD to increase supplementation to aid in caloric and protein needs.   Nutrition-Focused physical exam completed. Findings are moderate fat depletion, moderate to severe muscle depletion, and no edema.   Labs and medications reviewed.  Diet Order:  Diet regular Room service appropriate?: Yes; Fluid consistency:: Thin  Skin:   (+1 LE edema)  Last BM:  8/21  Height:   Ht Readings from Last 1 Encounters:  10/15/14 5\' 2"  (1.575 m)    Weight:   Wt Readings from Last 1 Encounters:  11/04/14 102 lb 11.2 oz (46.584 kg)    Ideal Body Weight:  50 kg  BMI:  Body mass index is 18.78 kg/(m^2).  Estimated Nutritional Needs:   Kcal:  1400-1600  Protein:  60-70 grams  Fluid:  >/= 1.5  L/day  EDUCATION NEEDS:   No education needs identified at this time  Corrin Parker, MS, RD, LDN Pager # [phone] After hours/ weekend pager # [phone]

## 2014-11-04 NOTE — Progress Notes (Signed)
  Echocardiogram 2D Echocardiogram has been performed.  [name] 11/04/2014, 11:08 AM

## 2014-11-04 NOTE — Evaluation (Signed)
Physical Therapy Evaluation Patient Details Name: [name] MRN: [id number] DOB: 1918/01/06 Today's Date: 11/04/2014   History of Present Illness  Pt is a 81 y.o. female with a history of Diastolic CHF, CAD, PAF, SSS S/P Pacemaker, HTN, Hypothyroid who presented to the [facility] ED with complaints of worsening SOB and Lower Leg swelling x3 weeks. On evaluation in the ED pt was found to have mild CHF with bilateral effusion on chest x-ray.  Clinical Impression  Pt admitted with above diagnosis. Pt currently with functional limitations due to the deficits listed below (see PT Problem List). At the time of PT eval pt was able to perform transfers and some ambulation with assist for balance/safety. Pt with knees buckling and states she feels they will give out on her. Pt is at an increased risk for falls at this time, and feel she is not safe to be home alone. As pt does not have 24 hour supervision available, feel she will benefit from continued rehab at the SNF level prior to return home. Daughter present and both pt/daughter agreeable. Pt will benefit from skilled PT to increase their independence and safety with mobility to allow discharge to the venue listed below.       Follow Up Recommendations SNF;Supervision/Assistance - 24 hour    Equipment Recommendations  Rolling walker with 5" wheels    Recommendations for Other Services       Precautions / Restrictions Precautions Precautions: Fall Restrictions Weight Bearing Restrictions: No      Mobility  Bed Mobility               General bed mobility comments: Pt was received sitting in straight back bedside chair  Transfers Overall transfer level: Needs assistance Equipment used: Rolling walker (2 wheeled) Transfers: Sit to/from Stand Sit to Stand: Min guard         General transfer comment: Hands-on guarding as pt powered-up to full standing position. Pt took increased time to gain/maintain standing balance.    Ambulation/Gait Ambulation/Gait assistance: Min assist Ambulation Distance (Feet): 75 Feet Assistive device: Rolling walker (2 wheeled) Gait Pattern/deviations: Step-through pattern;Decreased stride length;Trunk flexed Gait velocity: Decreased Gait velocity interpretation: Below normal speed for age/gender General Gait Details: Pt with occasional jerking/buckling of LE's. Pt had to stop walking and attempted to begin again multiple times before she was successful. States she feels as if her legs were going to give out on her.   Stairs            Wheelchair Mobility    Modified Rankin (Stroke Patients Only)       Balance Overall balance assessment: Needs assistance Sitting-balance support: Feet supported;No upper extremity supported Sitting balance-Leahy Scale: Fair     Standing balance support: Bilateral upper extremity supported;During functional activity Standing balance-Leahy Scale: Poor                               Pertinent Vitals/Pain Pain Assessment: No/denies pain    Home Living Family/patient expects to be discharged to:: Private residence Living Arrangements: Alone Available Help at Discharge: Family;Available PRN/intermittently Type of Home: Mobile home Home Access: Stairs to enter   Entrance Stairs-Number of Steps: 4 Home Layout: One level Home Equipment: Walker - 4 wheels;Cane - single point;Shower seat;Other (comment) (Lift chair) Additional Comments: Pt has one daughter who lives in the same yard as her and works during the day, and another daughter lives 45 minutes away.  Prior Function Level of Independence: Independent with assistive device(s)         Comments: Used the rollator all the time     Hand Dominance        Extremity/Trunk Assessment   Upper Extremity Assessment: Defer to OT evaluation           Lower Extremity Assessment: Generalized weakness (Noted bilateral buckling/jerking of LE's during  ambulation)      Cervical / Trunk Assessment: Kyphotic  Communication   Communication: HOH  Cognition Arousal/Alertness: Awake/alert Behavior During Therapy: WFL for tasks assessed/performed Overall Cognitive Status: Within Functional Limits for tasks assessed                      General Comments      Exercises        Assessment/Plan    PT Assessment Patient needs continued PT services  PT Diagnosis Difficulty walking;Generalized weakness   PT Problem List Decreased strength;Decreased range of motion;Decreased activity tolerance;Decreased mobility;Decreased balance;Decreased knowledge of use of DME;Decreased safety awareness;Decreased knowledge of precautions  PT Treatment Interventions DME instruction;Gait training;Stair training;Functional mobility training;Therapeutic activities;Therapeutic exercise;Neuromuscular re-education;Patient/family education   PT Goals (Current goals can be found in the Care Plan section) Acute Rehab PT Goals Patient Stated Goal: Return home PT Goal Formulation: With patient/family Time For Goal Achievement: 11/11/14 Potential to Achieve Goals: Good    Frequency Min 3X/week   Barriers to discharge Decreased caregiver support No 24 hour supervision available    Co-evaluation               End of Session Equipment Utilized During Treatment: Gait belt Activity Tolerance: Patient tolerated treatment well Patient left: in chair;with call bell/phone within reach;with family/visitor present Nurse Communication: Mobility status;Other (comment) (O2 status)         Time: 7062-3762 PT Time Calculation (min) (ACUTE ONLY): 29 min   Charges:   PT Evaluation $Initial PT Evaluation Tier I: 1 Procedure PT Treatments $Gait Training: 8-22 mins   PT G Codes:        Rolinda Roan 11/19/14, 3:06 PM   Rolinda Roan, PT, DPT Acute Rehabilitation Services Pager: [phone]

## 2014-11-04 NOTE — Clinical Documentation Improvement (Signed)
Hospitalist  Can the diagnosis of CKD be further specified?   CKD Stage I - GFR greater than or equal to 90  CKD Stage II - GFR 60-89  CKD Stage III - GFR 30-59  CKD Stage IV - GFR 15-29  CKD Stage V - GFR < 15  ESRD (End Stage Renal Disease)  Other condition  Unable to clinically determine   Supporting Information: :   81 year old w/history of Chronic renal failure and hypertension  Component     Latest Ref Rng 11/03/2014  BUN     6 - 20 mg/dL 25 (H)  Creatinine     0.44 - 1.00 mg/dL 1.48 (H)                       EGFR (Non-African Amer.)     >60 mL/min 29 (L)  EGFR (African American)     >60 mL/min 33 (L)   Monitoring BMP daily Saline lock Daily weight Strict I&O  Please exercise your independent, professional judgment when responding. A specific answer is not anticipated or expected.   Thank You, Normal [phone]

## 2014-11-04 NOTE — Telephone Encounter (Signed)
Confirmed remote transmission w/ pt daughter.   

## 2014-11-05 ENCOUNTER — Other Ambulatory Visit: Payer: Medicare Other

## 2014-11-05 ENCOUNTER — Encounter: Payer: Self-pay | Admitting: Cardiology

## 2014-11-05 DIAGNOSIS — I480 Paroxysmal atrial fibrillation: Secondary | ICD-10-CM

## 2014-11-05 DIAGNOSIS — D539 Nutritional anemia, unspecified: Secondary | ICD-10-CM

## 2014-11-05 LAB — BASIC METABOLIC PANEL
Anion gap: 5 (ref 5–15)
BUN: 24 mg/dL — AB (ref 6–20)
CO2: 37 mmol/L — ABNORMAL HIGH (ref 22–32)
CREATININE: 1.81 mg/dL — AB (ref 0.44–1.00)
Calcium: 8.6 mg/dL — ABNORMAL LOW (ref 8.9–10.3)
Chloride: 100 mmol/L — ABNORMAL LOW (ref 101–111)
GFR calc Af Amer: 26 mL/min — ABNORMAL LOW (ref >60)
GFR, EST NON AFRICAN AMERICAN: 23 mL/min — AB (ref >60)
Glucose, Bld: 96 mg/dL (ref 65–99)
Potassium: 4 mmol/L (ref 3.5–5.1)
SODIUM: 142 mmol/L (ref 135–145)

## 2014-11-05 MED ORDER — SODIUM CHLORIDE 0.9 % IV BOLUS (SEPSIS)
250.0000 mL | Freq: Once | INTRAVENOUS | Status: AC
  Administered 2014-11-05: 250 mL via INTRAVENOUS

## 2014-11-05 NOTE — Progress Notes (Signed)
Pt has not sent remote transmission 11-05-14

## 2014-11-05 NOTE — Progress Notes (Signed)
[facility] PROGRESS NOTE Assessment/Plan: Acute diastolic CHF (congestive heart failure)/ischemic cardiomyopathy: - I agree with IV Lasix on admission, was continue metoprolol. - Mild increase in her creatinine, DC IV Lasix, give her 250 mL bolus of normal saline. Check orthostatic vitals. - 2-D echo showed EF 55%. She probably has a narrow therapeutic window.  Generalized Weakness: Due to above. Consults physical therapy, recommended SNF.  Essential Hypertension: - Seems to be at goal.  Paroxysmal atrial fibrillation: - Atrially paced rhythm with a prolonged PR, on amiodarone and metoprolol.  Hypothyroid Continue Synthroid.  Chronic mild Thrombocytopenia - Stable.    SSS (sick sinus syndrome)  Code Status: DNR Family Communication: none  Disposition Plan: inpatient   Consultants:  none  Procedures:  CXR  ECHO  Antibiotics:  None  HPI/Subjective: Weakness improved.  Objective: Filed Vitals:   11/04/14 1433 11/04/14 1952 11/05/14 0252 11/05/14 0501  BP: 88/47 113/47  108/33  Pulse: 75 79  70  Temp: 98.3 F (36.8 C) 97.5 F (36.4 C)  98 F (36.7 C)  TempSrc: Oral Oral  Oral  Resp: 19 19  18   Height:   5\' 2"  (1.575 m)   Weight:    46.312 kg (102 lb 1.6 oz)  SpO2: 94% 97%  93%    Intake/Output Summary (Last 24 hours) at 11/05/14 1239 Last data filed at 11/04/14 2149  Gross per 24 hour  Intake    360 ml  Output    300 ml  Net     60 ml   Filed Weights   11/03/14 1803 11/04/14 0521 11/05/14 0501  Weight: 50.803 kg (112 lb) 46.584 kg (102 lb 11.2 oz) 46.312 kg (102 lb 1.6 oz)    Exam:  General: Alert, awake, oriented x3, in no acute distress.  HEENT: No bruits, no goiter. +HJ reflex. Heart: Regular rate and rhythm. Lungs: Good air movement,clear  Abdomen: Soft, nontender, nondistended, positive bowel sounds.  Neuro: Grossly intact, nonfocal.   Data Reviewed: Basic Metabolic Panel:  Recent Labs Lab 11/03/14 1807  11/05/14 0350  NA 143 142  K 4.0 4.0  CL 101 100*  CO2 33* 37*  GLUCOSE 95 96  BUN 25* 24*  CREATININE 1.48* 1.81*  CALCIUM 8.7* 8.6*   Liver Function Tests:  Recent Labs Lab 11/03/14 1807  AST 33  ALT 16  ALKPHOS 89  BILITOT 0.4  PROT 6.3*  ALBUMIN 3.2*   No results for input(s): LIPASE, AMYLASE in the last 168 hours. No results for input(s): AMMONIA in the last 168 hours. CBC:  Recent Labs Lab 11/03/14 1807  WBC 6.6  NEUTROABS 3.8  HGB 11.7*  HCT 38.3  MCV 105.5*  PLT 133*   Cardiac Enzymes:  Recent Labs Lab 11/03/14 1807  TROPONINI <0.03   BNP (last 3 results)  Recent Labs  11/03/14 1807  BNP 377.4*    ProBNP (last 3 results) No results for input(s): PROBNP in the last 8760 hours.  CBG:  Recent Labs Lab 11/04/14 0829  GLUCAP 74    No results found for this or any previous visit (from the past 240 hour(s)).   Studies: Dg Chest 2 View  11/03/2014   CLINICAL DATA:  81 year old with steadily worsening shortness of breath that began approximately 1 week ago.  EXAM: CHEST  2 VIEW  COMPARISON:  06/19/2014 and earlier.  FINDINGS: Cardiac silhouette markedly enlarged. Pulmonary venous hypertension and mild interstitial pulmonary edema superimposed upon COPD. Bilateral pleural effusions and associated consolidation in the lower  lobes. Left subclavian dual lead transvenous pacemaker unchanged and appears intact.  IMPRESSION: 1. Mild CHF superimposed upon COPD. 2. Bilateral pleural effusions with associated passive atelectasis in the lower lobes.   Electronically Signed   By: Evangeline Dakin M.D.   On: 11/03/2014 19:13    Scheduled Meds: . amiodarone  200 mg Oral Daily  . calcium-vitamin D  1 tablet Oral Daily  . clopidogrel  75 mg Oral Daily  . cycloSPORINE  1 drop Both Eyes BID  . enoxaparin (LOVENOX) injection  30 mg Subcutaneous Q24H  . famotidine  10 mg Oral Daily  . feeding supplement (ENSURE ENLIVE)  237 mL Oral BID BM  . ferrous sulfate   325 mg Oral Q breakfast  . levothyroxine  125 mcg Oral QAC breakfast  . metoprolol succinate  12.5 mg Oral Daily  . pantoprazole  40 mg Oral Daily  . potassium chloride  10 mEq Oral Daily  . sodium chloride  3 mL Intravenous Q12H  . vitamin B-12  1,000 mcg Oral Daily   Continuous Infusions:   Time Spent: 25 min   Charlynne Cousins  [facility] Pager [phone]. If 7PM-7AM, please contact night-coverage at [email or web address], password [facility] 11/05/2014, 12:39 PM  LOS: 2 days

## 2014-11-05 NOTE — Clinical Social Work Placement (Signed)
   CLINICAL SOCIAL WORK PLACEMENT  NOTE  Date:  11/05/2014  Patient Details  Name: [name] MRN: [id number] Date of Birth: 1917/09/26  Clinical Social Work is seeking post-discharge placement for this patient at the [place] level of care (*CSW will initial, date and re-position this form in  chart as items are completed):  Yes   Patient/family provided with [place] Work Department's list of facilities offering this level of care within the geographic area requested by the patient (or if unable, by the patient's family).  Yes   Patient/family informed of their freedom to choose among providers that offer the needed level of care, that participate in Medicare, Medicaid or managed care program needed by the patient, have an available bed and are willing to accept the patient.  Yes   Patient/family informed of [facility]'s ownership interest in [facility] and [facility], as well as of the fact that they are under no obligation to receive care at these facilities.  PASRR submitted to EDS on       PASRR number received on       Existing PASRR number confirmed on 11/05/14     FL2 transmitted to all facilities in geographic area requested by pt/family on 11/05/14     FL2 transmitted to all facilities within larger geographic area on       Patient informed that his/her managed care company has contracts with or will negotiate with certain facilities, including the following:            Patient/family informed of bed offers received.  Patient chooses bed at       Physician recommends and patient chooses bed at      Patient to be transferred to   on  .  Patient to be transferred to facility by       Patient family notified on   of transfer.  Name of family member notified:        PHYSICIAN Please sign FL2, Please sign DNR     Additional Comment:    _______________________________________________ Cranford Mon,  LCSW 11/05/2014, 12:49 PM

## 2014-11-05 NOTE — Clinical Social Work Note (Signed)
Clinical Social Work Assessment  Patient Details  Name: [name] MRN: [id number] Date of Birth: [birth year]  Date of referral:  11/05/14               Reason for consult:  Facility Placement                Permission sought to share information with:  Family Supports Permission granted to share information::  Yes, Verbal Permission Granted  Name::     [name]  Agency::  [facility]  Relationship::  daughter  Sport and exercise psychologist Information:     Housing/Transportation Living arrangements for the past 2 months:  Single Family Home Source of Information:  Patient Patient Interpreter Needed:    Criminal Activity/Legal Involvement Pertinent to Current Situation/Hospitalization:  No - Comment as needed Significant Relationships:  Adult Children Lives with:  Self Do you feel safe going back to the place where you live?  No Need for family participation in patient care:  Yes (Comment)  Care giving concerns:  Patient lives at home alone- has several family members that live in the area- daughter at bedside has ruptured disc and unable to help physically, another daughter had recent stroke and physically unable to help either- no support that can help pt at home   Social Worker assessment / plan:  CSW spoke with pt and daughter about PT recommendation for SNF  Employment status:  Retired Forensic scientist:  Medicare PT Recommendations:  [place] / Referral to community resources:  [place]  Patient/Family's Response to care:  Pt and daughter are agreeable- state they have been to [place] in the past and are agreeable to return if possible  Patient/Family's Understanding of and Emotional Response to Diagnosis, Current Treatment, and Prognosis:  Pt and daughter had no questions or concerns about current prognosis/treatment  Emotional Assessment Appearance:  Appears stated age Attitude/Demeanor/Rapport:    Affect (typically observed):   Appropriate, Quiet Orientation:  Oriented to Self, Oriented to Place, Oriented to Situation, Oriented to  Time Alcohol / Substance use:  Not Applicable Psych involvement (Current and /or in the community):  No (Comment)  Discharge Needs  Concerns to be addressed:  Care Coordination, Home Safety Concerns Readmission within the last 30 days:  No Current discharge risk:  Physical Impairment, Lives alone Barriers to Discharge:  Continued Medical Work up   [employer], LCSW 11/05/2014, 12:47 PM

## 2014-11-05 NOTE — Progress Notes (Deleted)
Remote pacemaker transmission.   

## 2014-11-05 NOTE — Progress Notes (Signed)
UR Completed. Adysson Revelle, RN, BSN.  [phone] 

## 2014-11-06 DIAGNOSIS — E43 Unspecified severe protein-calorie malnutrition: Secondary | ICD-10-CM | POA: Insufficient documentation

## 2014-11-06 DIAGNOSIS — L8990 Pressure ulcer of unspecified site, unspecified stage: Secondary | ICD-10-CM | POA: Insufficient documentation

## 2014-11-06 DIAGNOSIS — I255 Ischemic cardiomyopathy: Secondary | ICD-10-CM

## 2014-11-06 DIAGNOSIS — I5031 Acute diastolic (congestive) heart failure: Secondary | ICD-10-CM

## 2014-11-06 LAB — BASIC METABOLIC PANEL
Anion gap: 7 (ref 5–15)
BUN: 20 mg/dL (ref 6–20)
CHLORIDE: 100 mmol/L — AB (ref 101–111)
CO2: 35 mmol/L — ABNORMAL HIGH (ref 22–32)
Calcium: 8.5 mg/dL — ABNORMAL LOW (ref 8.9–10.3)
Creatinine, Ser: 1.66 mg/dL — ABNORMAL HIGH (ref 0.44–1.00)
GFR calc Af Amer: 29 mL/min — ABNORMAL LOW (ref >60)
GFR calc non Af Amer: 25 mL/min — ABNORMAL LOW (ref >60)
GLUCOSE: 82 mg/dL (ref 65–99)
POTASSIUM: 4.3 mmol/L (ref 3.5–5.1)
Sodium: 142 mmol/L (ref 135–145)

## 2014-11-06 MED ORDER — FUROSEMIDE 40 MG PO TABS
40.0000 mg | ORAL_TABLET | Freq: Every day | ORAL | Status: DC
  Administered 2014-11-06 – 2014-11-07 (×2): 40 mg via ORAL
  Filled 2014-11-06 (×2): qty 1

## 2014-11-06 NOTE — Progress Notes (Signed)
[facility] PROGRESS NOTE Assessment/Plan: Acute diastolic CHF (congestive heart failure)/ischemic cardiomyopathy: - Improved IV Lasix and then yesterday received normal saline bolus and Lasix was held due to rising creatinine - 2-D echo showed EF 55% - Resume low-dose by mouth Lasix today -B met in a.m., she is -1.0 L  Generalized Weakness: Due to above. -s/p physical therapy eval, recommended SNF.  Essential Hypertension: - Seems to be at goal.  Paroxysmal atrial fibrillation: - stable, in NSR on amiodarone and metoprolol.  Hypothyroid Continue Synthroid.  Chronic mild Thrombocytopenia - Stable.    SSS (sick sinus syndrome)  DVt proph: Hep SQ  Code Status: DNR Family Communication: none  Disposition Plan: SNF tomorrow   Consultants:  none  Procedures:  CXR  ECHO  Antibiotics:  None  HPI/Subjective: Weakness improved.  Objective: Filed Vitals:   11/05/14 2100 11/06/14 0445 11/06/14 0500 11/06/14 0632  BP: 99/44 111/49    Pulse: 74 70    Temp: 98 F (36.7 C) 98.5 F (36.9 C)    TempSrc: Oral Oral    Resp: 18 18    Height:      Weight:   47.446 kg (104 lb 9.6 oz)   SpO2: 95% 91%  95%   No intake or output data in the 24 hours ending 11/06/14 1304 Filed Weights   11/04/14 0521 11/05/14 0501 11/06/14 0500  Weight: 46.584 kg (102 lb 11.2 oz) 46.312 kg (102 lb 1.6 oz) 47.446 kg (104 lb 9.6 oz)    Exam:  General: Alert, awake, oriented x3, in no acute distress.  HEENT: No bruits, no goiter. +HJ reflex. Heart: Regular rate and rhythm. Lungs: Good air movement,clear  Abdomen: Soft, nontender, nondistended, positive bowel sounds.  Neuro: Grossly intact, nonfocal.   Data Reviewed: Basic Metabolic Panel:  Recent Labs Lab 11/03/14 1807 11/05/14 0350 11/06/14 0501  NA 143 142 142  K 4.0 4.0 4.3  CL 101 100* 100*  CO2 33* 37* 35*  GLUCOSE 95 96 82  BUN 25* 24* 20  CREATININE 1.48* 1.81* 1.66*  CALCIUM 8.7* 8.6* 8.5*    Liver Function Tests:  Recent Labs Lab 11/03/14 1807  AST 33  ALT 16  ALKPHOS 89  BILITOT 0.4  PROT 6.3*  ALBUMIN 3.2*   No results for input(s): LIPASE, AMYLASE in the last 168 hours. No results for input(s): AMMONIA in the last 168 hours. CBC:  Recent Labs Lab 11/03/14 1807  WBC 6.6  NEUTROABS 3.8  HGB 11.7*  HCT 38.3  MCV 105.5*  PLT 133*   Cardiac Enzymes:  Recent Labs Lab 11/03/14 1807  TROPONINI <0.03   BNP (last 3 results)  Recent Labs  11/03/14 1807  BNP 377.4*    ProBNP (last 3 results) No results for input(s): PROBNP in the last 8760 hours.  CBG:  Recent Labs Lab 11/04/14 0829  GLUCAP 74    No results found for this or any previous visit (from the past 240 hour(s)).   Studies: No results found.  Scheduled Meds: . amiodarone  200 mg Oral Daily  . calcium-vitamin D  1 tablet Oral Daily  . clopidogrel  75 mg Oral Daily  . cycloSPORINE  1 drop Both Eyes BID  . enoxaparin (LOVENOX) injection  30 mg Subcutaneous Q24H  . famotidine  10 mg Oral Daily  . feeding supplement (ENSURE ENLIVE)  237 mL Oral BID BM  . ferrous sulfate  325 mg Oral Q breakfast  . furosemide  40 mg Oral Daily  .  levothyroxine  125 mcg Oral QAC breakfast  . metoprolol succinate  12.5 mg Oral Daily  . pantoprazole  40 mg Oral Daily  . vitamin B-12  1,000 mcg Oral Daily   Continuous Infusions:   Time Spent: 25 min   Sharleen Szczesny  [facility] Pager [phone]. If 7PM-7AM, please contact night-coverage at [email or web address], password [facility] 11/06/2014, 1:04 PM  LOS: 3 days

## 2014-11-06 NOTE — Progress Notes (Signed)
Physical Therapy Treatment Patient Details Name: [name] MRN: [id number] DOB: Jul 31, 1917 Today's Date: 11/06/2014    History of Present Illness Pt is a 81 y.o. female with a history of Diastolic CHF, CAD, PAF, SSS S/P Pacemaker, HTN, Hypothyroid who presented to the [facility] ED with complaints of worsening SOB and Lower Leg swelling x3 weeks. On evaluation in the ED pt was found to have mild CHF with bilateral effusion on chest x-ray.    PT Comments    Pt progressing towards physical therapy goals. Was able to improve ambulation this session with minimal knee buckling noted. Pt/family educated on need for RW at d/c vs. Rollator. Daughter reports that pt uses the seat on the rollator to carry items around the house (and meals from the counter to her chair) and has fallen trying to carry items and push a RW at the same time. As the RW will provide more stability which is necessary for safety at this time, advised that they look into an attachable tray for the RW to keep bilateral UE's free for walker negotiation. Will continue to follow.   Follow Up Recommendations  SNF;Supervision/Assistance - 24 hour     Equipment Recommendations  Rolling walker with 5" wheels and attachable tray   Recommendations for Other Services       Precautions / Restrictions Precautions Precautions: Fall Restrictions Weight Bearing Restrictions: No    Mobility  Bed Mobility Overal bed mobility: Needs Assistance Bed Mobility: Supine to Sit     Supine to sit: Supervision     General bed mobility comments: Pt was able to transition to EOB with HOB elevated and minimal use of bed rails. No physical assist required.   Transfers Overall transfer level: Needs assistance Equipment used: Rolling walker (2 wheeled) Transfers: Sit to/from Stand Sit to Stand: Min guard         General transfer comment: Hands-on guarding as pt powered-up to full standing position.   Ambulation/Gait Ambulation/Gait  assistance: Min guard Ambulation Distance (Feet): 200 Feet Assistive device: Rolling walker (2 wheeled) Gait Pattern/deviations: Step-through pattern;Decreased stride length;Trunk flexed Gait velocity: Decreased Gait velocity interpretation: Below normal speed for age/gender General Gait Details: Pt was able to ambulate well with RW for support. VC's for improved posture but due to kyphosis, was not able to maintain for very long. 1-2 instances of minimal jerking/buckling in knees, however did not require any assistance to recover.    Stairs            Wheelchair Mobility    Modified Rankin (Stroke Patients Only)       Balance Overall balance assessment: Needs assistance Sitting-balance support: Feet supported;No upper extremity supported Sitting balance-Leahy Scale: Fair     Standing balance support: During functional activity;Bilateral upper extremity supported Standing balance-Leahy Scale: Fair Standing balance comment: Pt was able to stand at edge of chair without UE support before reaching back for arm rests and sitting.                     Cognition Arousal/Alertness: Awake/alert Behavior During Therapy: WFL for tasks assessed/performed Overall Cognitive Status: Within Functional Limits for tasks assessed                      Exercises      General Comments        Pertinent Vitals/Pain Pain Assessment: No/denies pain    Home Living  Prior Function            PT Goals (current goals can now be found in the care plan section) Acute Rehab PT Goals Patient Stated Goal: Return home PT Goal Formulation: With patient/family Time For Goal Achievement: 11/11/14 Potential to Achieve Goals: Good Progress towards PT goals: Progressing toward goals    Frequency  Min 3X/week    PT Plan Current plan remains appropriate    Co-evaluation             End of Session Equipment Utilized During Treatment: Gait  belt Activity Tolerance: Patient tolerated treatment well Patient left: in chair;with call bell/phone within reach;with chair alarm set;with family/visitor present     Time: 0838-0900 PT Time Calculation (min) (ACUTE ONLY): 22 min  Charges:  $Gait Training: 8-22 mins                    G Codes:      Rolinda Roan 08-Nov-2014, 9:11 AM  Rolinda Roan, PT, DPT Acute Rehabilitation Services Pager: [phone]

## 2014-11-06 NOTE — Progress Notes (Signed)
CSW gave bed offers- family and pt chooses [facility].  MD anticipates [facility] confirms they can accept.  CSW will continue to follow.  Domenica Reamer, [place] Social Worker [phone]

## 2014-11-07 ENCOUNTER — Ambulatory Visit: Payer: Medicare Other | Admitting: Internal Medicine

## 2014-11-07 ENCOUNTER — Telehealth: Payer: Self-pay | Admitting: *Deleted

## 2014-11-07 DIAGNOSIS — K219 Gastro-esophageal reflux disease without esophagitis: Secondary | ICD-10-CM | POA: Diagnosis not present

## 2014-11-07 DIAGNOSIS — R2681 Unsteadiness on feet: Secondary | ICD-10-CM | POA: Diagnosis not present

## 2014-11-07 DIAGNOSIS — I259 Chronic ischemic heart disease, unspecified: Secondary | ICD-10-CM | POA: Diagnosis not present

## 2014-11-07 DIAGNOSIS — E039 Hypothyroidism, unspecified: Secondary | ICD-10-CM | POA: Diagnosis not present

## 2014-11-07 DIAGNOSIS — E876 Hypokalemia: Secondary | ICD-10-CM | POA: Diagnosis not present

## 2014-11-07 DIAGNOSIS — I255 Ischemic cardiomyopathy: Secondary | ICD-10-CM | POA: Diagnosis not present

## 2014-11-07 DIAGNOSIS — I2510 Atherosclerotic heart disease of native coronary artery without angina pectoris: Secondary | ICD-10-CM | POA: Diagnosis not present

## 2014-11-07 DIAGNOSIS — R531 Weakness: Secondary | ICD-10-CM | POA: Diagnosis not present

## 2014-11-07 DIAGNOSIS — D638 Anemia in other chronic diseases classified elsewhere: Secondary | ICD-10-CM | POA: Diagnosis not present

## 2014-11-07 DIAGNOSIS — E440 Moderate protein-calorie malnutrition: Secondary | ICD-10-CM | POA: Diagnosis not present

## 2014-11-07 DIAGNOSIS — R278 Other lack of coordination: Secondary | ICD-10-CM | POA: Diagnosis not present

## 2014-11-07 DIAGNOSIS — D696 Thrombocytopenia, unspecified: Secondary | ICD-10-CM | POA: Diagnosis not present

## 2014-11-07 DIAGNOSIS — I429 Cardiomyopathy, unspecified: Secondary | ICD-10-CM | POA: Diagnosis not present

## 2014-11-07 DIAGNOSIS — R1314 Dysphagia, pharyngoesophageal phase: Secondary | ICD-10-CM | POA: Diagnosis not present

## 2014-11-07 DIAGNOSIS — I5031 Acute diastolic (congestive) heart failure: Secondary | ICD-10-CM | POA: Diagnosis not present

## 2014-11-07 DIAGNOSIS — I509 Heart failure, unspecified: Secondary | ICD-10-CM | POA: Diagnosis not present

## 2014-11-07 DIAGNOSIS — M6281 Muscle weakness (generalized): Secondary | ICD-10-CM | POA: Diagnosis not present

## 2014-11-07 DIAGNOSIS — I10 Essential (primary) hypertension: Secondary | ICD-10-CM | POA: Diagnosis not present

## 2014-11-07 DIAGNOSIS — I714 Abdominal aortic aneurysm, without rupture: Secondary | ICD-10-CM | POA: Diagnosis not present

## 2014-11-07 DIAGNOSIS — I959 Hypotension, unspecified: Secondary | ICD-10-CM | POA: Diagnosis not present

## 2014-11-07 DIAGNOSIS — E46 Unspecified protein-calorie malnutrition: Secondary | ICD-10-CM | POA: Diagnosis not present

## 2014-11-07 DIAGNOSIS — I480 Paroxysmal atrial fibrillation: Secondary | ICD-10-CM | POA: Diagnosis not present

## 2014-11-07 LAB — BASIC METABOLIC PANEL
ANION GAP: 8 (ref 5–15)
BUN: 25 mg/dL — ABNORMAL HIGH (ref 6–20)
CHLORIDE: 100 mmol/L — AB (ref 101–111)
CO2: 34 mmol/L — ABNORMAL HIGH (ref 22–32)
CREATININE: 1.75 mg/dL — AB (ref 0.44–1.00)
Calcium: 8.5 mg/dL — ABNORMAL LOW (ref 8.9–10.3)
GFR calc non Af Amer: 24 mL/min — ABNORMAL LOW (ref >60)
GFR, EST AFRICAN AMERICAN: 27 mL/min — AB (ref >60)
Glucose, Bld: 77 mg/dL (ref 65–99)
POTASSIUM: 4.3 mmol/L (ref 3.5–5.1)
SODIUM: 142 mmol/L (ref 135–145)

## 2014-11-07 LAB — CBC
HCT: 35.7 % — ABNORMAL LOW (ref 36.0–46.0)
Hemoglobin: 10.9 g/dL — ABNORMAL LOW (ref 12.0–15.0)
MCH: 31.9 pg (ref 26.0–34.0)
MCHC: 30.5 g/dL (ref 30.0–36.0)
MCV: 104.4 fL — AB (ref 78.0–100.0)
PLATELETS: 132 10*3/uL — AB (ref 150–400)
RBC: 3.42 MIL/uL — AB (ref 3.87–5.11)
RDW: 14.5 % (ref 11.5–15.5)
WBC: 5.5 10*3/uL (ref 4.0–10.5)

## 2014-11-07 MED ORDER — FUROSEMIDE 20 MG PO TABS: 20.0000 mg | ORAL_TABLET | Freq: Every day | ORAL | Status: DC

## 2014-11-07 NOTE — Discharge Summary (Signed)
Physician Discharge Summary  [name] HYI:[id number] DOB: Jul 26, 1917 DOA: 11/03/2014  PCP: Lauree Chandler, NP  Admit date: 11/03/2014 Discharge date: 11/07/2014  Time spent: 45 minutes  Recommendations for Outpatient Follow-up:  1. PCP in 1 week 2. Bmet in 1 week  Discharge Diagnoses:  Principal Problem:   Acute diastolic CHF (congestive heart failure) Active Problems:   CAD (coronary artery disease)   Hypothyroid   Thrombocytopenia   Paroxysmal atrial fibrillation   AAA (abdominal aortic aneurysm)   Weakness   Hypertension   SSS (sick sinus syndrome)   Macrocytic anemia   Cardiomyopathy, ischemic   Protein-calorie malnutrition, severe   Pressure ulcer   Discharge Condition: stable  Diet recommendation: low sodium  Filed Weights   11/05/14 0501 11/06/14 0500 11/07/14 0500  Weight: 46.312 kg (102 lb 1.6 oz) 47.446 kg (104 lb 9.6 oz) 47.582 kg (104 lb 14.4 oz)    History of present illness:  Chief Complaint: SOB  HPI: [name] is a 81 y.o. female with a history of Diastolic CHF, CAD, PAF, SSS S/P Pacemaker, HTN, Hypothyroid who presented to the [facility] ED with complaints of worsening SOB and Lower Leg swelling x 3 weeks. Her family reported thather leg edema had been getting worse since her lasix had been recently discontinued.The Lasix was recently restarted for every other day but her family report that it has not been working.On evaluation she was found to have Mild CHF with bilateral effusion on Chest X-ray  Hospital Course:  Acute diastolic CHF (congestive heart failure)/ischemic cardiomyopathy: - Improved with IV Lasix and then yesterday received normal saline bolus and Lasix was held due to rising creatinine, subsequently low dose lasix resumed - 2-D echo showed EF 55% - clinically improved, negative 1.8 L - lasix changed to 20mg  Po daily - needs Bmet checked at FU  Generalized Weakness: -Due to above. -s/p physical therapy eval, recommended  SNF.  CAD H/o multiple PCIs last in 2006 -continue Toprol and plavix  Essential Hypertension: - stable  Paroxysmal atrial fibrillation: - stable, in NSR on amiodarone and metoprolol. - not an anticoagulation candidate per her Cardiologist due to age, fall risk etc - continue plavix  Hypothyroid Continue Synthroid.  Chronic mild Thrombocytopenia - Stable.  SSS (sick sinus syndrome) -s/p pacer  Infrarenal AAA -on surveillance   Discharge Exam: Filed Vitals:   11/07/14 0531  BP: 112/40  Pulse: 77  Temp: 98.3 F (36.8 C)  Resp: 18    General: AAOx3 Cardiovascular: S1S2/RRR Respiratory: CTAB  Discharge Instructions   Discharge Instructions    Diet - low sodium heart healthy    Complete by:  As directed      Increase activity slowly    Complete by:  As directed           Current Discharge Medication List    CONTINUE these medications which have CHANGED   Details  furosemide (LASIX) 20 MG tablet Take 1 tablet (20 mg total) by mouth daily. Qty: 30 tablet, Refills: 3   Associated Diagnoses: Congestive heart failure, unspecified congestive heart failure chronicity, unspecified congestive heart failure type      CONTINUE these medications which have NOT CHANGED   Details  amiodarone (PACERONE) 200 MG tablet TAKE 1 TABLET BY MOUTH DAILY. Qty: 30 tablet, Refills: 4    Biotin 1000 MCG tablet Take 1,000 mcg by mouth daily.    Calcium Carb-Cholecalciferol (CALCIUM 600 + D PO) Take 1 tablet by mouth daily.    clopidogrel (  PLAVIX) 75 MG tablet Take 1 tablet (75 mg total) by mouth daily. Qty: 90 tablet, Refills: 1    feeding supplement (BOOST HIGH PROTEIN) LIQD Take 1 Container by mouth daily.    ferrous sulfate 325 (65 FE) MG tablet Take 325 mg by mouth daily with breakfast.     levothyroxine (SYNTHROID, LEVOTHROID) 125 MCG tablet TAKE 1 TABLET BY MOUTH EVERY DAY Qty: 30 tablet, Refills: 0    metoprolol succinate (TOPROL-XL) 25 MG 24 hr tablet Take 12.5  mg by mouth daily.    nitroGLYCERIN (NITROSTAT) 0.4 MG SL tablet Place 1 tablet (0.4 mg total) under the tongue every 5 (five) minutes as needed for chest pain (For a total of 3 tablets, if chest pain persist to call 911). Qty: 50 tablet, Refills: 3    pantoprazole (PROTONIX) 40 MG tablet Take 1 tablet (40 mg total) by mouth daily. Qty: 30 tablet, Refills: 3   Associated Diagnoses: Gastroesophageal reflux disease, esophagitis presence not specified    potassium chloride SA (K-DUR,KLOR-CON) 20 MEQ tablet Take 20 mEq by mouth daily.    ranitidine (ZANTAC) 300 MG tablet Take 300 mg by mouth at bedtime.     vitamin B-12 (CYANOCOBALAMIN) 1000 MCG tablet Take 1,000 mcg by mouth daily.      STOP taking these medications     cycloSPORINE (RESTASIS) 0.05 % ophthalmic emulsion        Allergies  Allergen Reactions  . Nubain [Nalbuphine Hcl] Anaphylaxis  . Statins     Leg weakness  . Iodine Rash   Follow-up Information    Follow up with EUBANKS, JESSICA K, NP In 1 week.   Specialty:  Nurse Practitioner   Contact information:   [address] [place] [address] [phone]        The results of significant diagnostics from this hospitalization (including imaging, microbiology, ancillary and laboratory) are listed below for reference.    Significant Diagnostic Studies: Dg Chest 2 View  11/03/2014   CLINICAL DATA:  81 year old with steadily worsening shortness of breath that began approximately 1 week ago.  EXAM: CHEST  2 VIEW  COMPARISON:  06/19/2014 and earlier.  FINDINGS: Cardiac silhouette markedly enlarged. Pulmonary venous hypertension and mild interstitial pulmonary edema superimposed upon COPD. Bilateral pleural effusions and associated consolidation in the lower lobes. Left subclavian dual lead transvenous pacemaker unchanged and appears intact.  IMPRESSION: 1. Mild CHF superimposed upon COPD. 2. Bilateral pleural effusions with associated passive atelectasis in the lower  lobes.   Electronically Signed   By: Evangeline Dakin M.D.   On: 11/03/2014 19:13    Microbiology: No results found for this or any previous visit (from the past 240 hour(s)).   Labs: Basic Metabolic Panel:  Recent Labs Lab 11/03/14 1807 11/05/14 0350 11/06/14 0501 11/07/14 0525  NA 143 142 142 142  K 4.0 4.0 4.3 4.3  CL 101 100* 100* 100*  CO2 33* 37* 35* 34*  GLUCOSE 95 96 82 77  BUN 25* 24* 20 25*  CREATININE 1.48* 1.81* 1.66* 1.75*  CALCIUM 8.7* 8.6* 8.5* 8.5*   Liver Function Tests:  Recent Labs Lab 11/03/14 1807  AST 33  ALT 16  ALKPHOS 89  BILITOT 0.4  PROT 6.3*  ALBUMIN 3.2*   No results for input(s): LIPASE, AMYLASE in the last 168 hours. No results for input(s): AMMONIA in the last 168 hours. CBC:  Recent Labs Lab 11/03/14 1807 11/07/14 0525  WBC 6.6 5.5  NEUTROABS 3.8  --  HGB 11.7* 10.9*  HCT 38.3 35.7*  MCV 105.5* 104.4*  PLT 133* 132*   Cardiac Enzymes:  Recent Labs Lab 11/03/14 1807  TROPONINI <0.03   BNP: BNP (last 3 results)  Recent Labs  11/03/14 1807  BNP 377.4*    ProBNP (last 3 results) No results for input(s): PROBNP in the last 8760 hours.  CBG:  Recent Labs Lab 11/04/14 0829  GLUCAP 74       Signed:  Sherena Machorro  [facility] 11/07/2014, 10:15 AM

## 2014-11-07 NOTE — Progress Notes (Signed)
Patient will discharge to [facility] Anticipated discharge date:11/07/14 Family notified: at bedside Transportation by family  CSW signing off.  Domenica Reamer, [place] Social Worker [phone]

## 2014-11-07 NOTE — Care Management Important Message (Signed)
importantImportant Message  Patient Details  Name: TELEAH VILLAMAR MRN: [id number] Date of Birth: 1918/01/18   Medicare Important Message Given:  Yes-third notification given    Nathen May 11/07/2014, 10:58 AMImportant Message  Patient Details  Name: BRIDGITT RAGGIO MRN: [id number] Date of Birth: Nov 22, 1917   Medicare Important Message Given:  Yes-third notification given    Nathen May 11/07/2014, 10:58 AM

## 2014-11-07 NOTE — Telephone Encounter (Signed)
Spoke with a family member regarding patient setting up a hospital follow-up, the family member stated that the patient was going to be admitted to [facility] for rehab. I asked the family member to please inform the office if she is going to stay for a long length of time.

## 2014-11-08 ENCOUNTER — Non-Acute Institutional Stay (SKILLED_NURSING_FACILITY): Payer: Medicare Other | Admitting: Adult Health

## 2014-11-08 ENCOUNTER — Encounter: Payer: Self-pay | Admitting: Adult Health

## 2014-11-08 DIAGNOSIS — D638 Anemia in other chronic diseases classified elsewhere: Secondary | ICD-10-CM | POA: Diagnosis not present

## 2014-11-08 DIAGNOSIS — R531 Weakness: Secondary | ICD-10-CM | POA: Diagnosis not present

## 2014-11-08 DIAGNOSIS — E039 Hypothyroidism, unspecified: Secondary | ICD-10-CM | POA: Diagnosis not present

## 2014-11-08 DIAGNOSIS — I714 Abdominal aortic aneurysm, without rupture, unspecified: Secondary | ICD-10-CM

## 2014-11-08 DIAGNOSIS — I2510 Atherosclerotic heart disease of native coronary artery without angina pectoris: Secondary | ICD-10-CM | POA: Diagnosis not present

## 2014-11-08 DIAGNOSIS — I480 Paroxysmal atrial fibrillation: Secondary | ICD-10-CM | POA: Diagnosis not present

## 2014-11-08 DIAGNOSIS — I10 Essential (primary) hypertension: Secondary | ICD-10-CM

## 2014-11-08 DIAGNOSIS — K219 Gastro-esophageal reflux disease without esophagitis: Secondary | ICD-10-CM

## 2014-11-08 DIAGNOSIS — I5031 Acute diastolic (congestive) heart failure: Secondary | ICD-10-CM | POA: Diagnosis not present

## 2014-11-08 DIAGNOSIS — E876 Hypokalemia: Secondary | ICD-10-CM

## 2014-11-10 ENCOUNTER — Other Ambulatory Visit: Payer: Self-pay | Admitting: Nurse Practitioner

## 2014-11-10 NOTE — Progress Notes (Signed)
Patient ID: [name], female   DOB: Jun 22, 1917, 81 y.o.   MRN: [id number]    DATE:  11/08/14 MRN:  [id number]  BIRTHDAY: 1918-01-05  Facility:  [facility]urs[facility] Home Location:  [place] Room Number: 557-3  LEVEL OF CARE:  SNF [phone])  Contact Information    Name Relation Home Work Mobile   [place] Daughter [phone]  [phone]   [name] Daughter [phone]       Chief Complaint  Patient presents with  . Hospitalization Follow-up    Generalized weakness, acute diastolic CHF, CAD, hypertension, PAF, hypothyroidism, AAA, GERD and hypokalemia    HISTORY OF PRESENT ILLNESS:  This is a 81 year old female who has been admitted to [facility] on 11/07/14 from [facility]. She has PMH of diastolic CHF, CAD, PAF, SSS S/P pacemaker, hypertension and hypothyroidism. She was having worsening SOB and lower leg swelling 3 weeks. She was evaluated and treated for mild CHF.  She has been admitted for a short-term rehabilitation.   PAST MEDICAL HISTORY:  Past Medical History  Diagnosis Date  . GI bleed 2007  . Hypertension   . TIA (transient ischemic attack)   . Hypothyroidism   . Abdominal aortic aneurysm   . CAD (coronary artery disease)   . SSS (sick sinus syndrome) 10/05/2007    Medtronic Adapta  . Myocardial infarction   . Atrial fibrillation   . COPD (chronic obstructive pulmonary disease)   . Asthma     hx  . Anemia     hx  . Blood transfusion   . UTI (lower urinary tract infection)     hx  . Renal failure, chronic   . H/O hiatal hernia   . GERD (gastroesophageal reflux disease)   . Stroke   . Osteoporosis   . Blood transfusion without reported diagnosis   . Pacemaker      CURRENT MEDICATIONS: Reviewed  Patient's Medications  New Prescriptions   No medications on file  Previous Medications   AMIODARONE (PACERONE) 200 MG TABLET    TAKE 1 TABLET BY MOUTH DAILY.   BIOTIN 1000 MCG  TABLET    Take 1,000 mcg by mouth daily.   CALCIUM CARB-CHOLECALCIFEROL (CALCIUM 600 + D PO)    Take 1 tablet by mouth daily.   CLOPIDOGREL (PLAVIX) 75 MG TABLET    Take 1 tablet (75 mg total) by mouth daily.   FEEDING SUPPLEMENT (BOOST HIGH PROTEIN) LIQD    Take 1 Container by mouth daily.   FERROUS SULFATE 325 (65 FE) MG TABLET    Take 325 mg by mouth daily with breakfast.    FUROSEMIDE (LASIX) 20 MG TABLET    Take 1 tablet (20 mg total) by mouth daily.   LEVOTHYROXINE (SYNTHROID, LEVOTHROID) 125 MCG TABLET    TAKE 1 TABLET BY MOUTH EVERY DAY   METOPROLOL SUCCINATE (TOPROL-XL) 25 MG 24 HR TABLET    Take 12.5 mg by mouth daily.   NITROGLYCERIN (NITROSTAT) 0.4 MG SL TABLET    Place 1 tablet (0.4 mg total) under the tongue every 5 (five) minutes as needed for chest pain (For a total of 3 tablets, if chest pain persist to call 911).   PANTOPRAZOLE (PROTONIX) 40 MG TABLET    Take 1 tablet (40 mg total) by mouth daily.   POTASSIUM CHLORIDE SA (K-DUR,KLOR-CON) 20 MEQ TABLET    Take 20 mEq by mouth daily.   RANITIDINE (ZANTAC) 300 MG TABLET    Take  300 mg by mouth at bedtime.    VITAMIN B-12 (CYANOCOBALAMIN) 1000 MCG TABLET    Take 1,000 mcg by mouth daily.  Modified Medications   No medications on file  Discontinued Medications   No medications on file     Allergies  Allergen Reactions  . Nubain [Nalbuphine Hcl] Anaphylaxis  . Statins     Leg weakness  . Iodine Rash     REVIEW OF SYSTEMS:  GENERAL: no change in appetite, no fatigue, no weight changes, no fever, chills  SKIN: Denies rash, itching, wounds, ulcer sores, or nail abnormality EYES: Denies change in vision, dry eyes, eye pain, itching or discharge EARS: Denies ringing in ears, or earache NOSE: Denies nasal congestion or epistaxis MOUTH and THROAT: Denies oral discomfort, gingival pain or bleeding, pain from teeth or hoarseness   RESPIRATORY: no cough, SOB, DOE, wheezing, hemoptysis CARDIAC: no chest pain, edema or  palpitations GI: no abdominal pain, diarrhea, constipation, heart burn, nausea or vomiting GU: Denies dysuria, frequency, hematuria, incontinence, or discharge    PHYSICAL EXAMINATION  GENERAL APPEARANCE: Well nourished. In no acute distress. Normal body habitus SKIN:  Skin is warm and dry. There are no suspicious lesions or rash HEAD: Normal in size and contour. No evidence of trauma EYES: Lids open and close normally. No blepharitis, entropion or ectropion. PERRL. Conjunctivae are clear and sclerae are white. Lenses are without opacity EARS: Pinnae are normal. Right side has hearing aide MOUTH and THROAT: Lips are without lesions. Oral mucosa is moist and without lesions. Tongue is normal in shape, size, and color and without lesions NECK: supple, trachea midline, no neck masses, no thyroid tenderness, no thyromegaly LYMPHATICS: no LAN in the neck, no supraclavicular LAN RESPIRATORY: breathing is even & unlabored, BS CTAB CARDIAC: RRR, no murmur,no extra heart sounds, no edema, left chest pacemaker GI: abdomen soft, normal BS, no masses, no tenderness, no hepatomegaly, no splenomegaly EXTREMITIES:  Able to move 4 extremities PSYCHIATRIC: Alert and oriented X 3. Affect and behavior are appropriate  LABS/RADIOLOGY: Labs reviewed: Basic Metabolic Panel:  Recent Labs  11/05/14 0350 11/06/14 0501 11/07/14 0525  NA 142 142 142  K 4.0 4.3 4.3  CL 100* 100* 100*  CO2 37* 35* 34*  GLUCOSE 96 82 77  BUN 24* 20 25*  CREATININE 1.81* 1.66* 1.75*  CALCIUM 8.6* 8.5* 8.5*   Liver Function Tests:  Recent Labs  06/19/14 2034 08/01/14 1401 11/03/14 1807  AST 59* 31 33  ALT 42* 17 16  ALKPHOS 76 91 89  BILITOT 0.7 0.3 0.4  PROT 6.2 6.0 6.3*  ALBUMIN 3.1*  --  3.2*    CBC:  Recent Labs  06/19/14 2034 08/01/14 1401 11/03/14 1807 11/07/14 0525  WBC 6.6 6.4 6.6 5.5  NEUTROABS 3.8 4.0 3.8  --   HGB 11.7*  --  11.7* 10.9*  HCT 37.2 38.0 38.3 35.7*  MCV 103.0*  --  105.5*  104.4*  PLT 106*  --  133* 132*   Cardiac Enzymes:  Recent Labs  11/03/14 1807  TROPONINI <0.03    CBG:  Recent Labs  11/04/14 0829  GLUCAP 74    Dg Chest 2 View  11/03/2014   CLINICAL DATA:  81 year old with steadily worsening shortness of breath that began approximately 1 week ago.  EXAM: CHEST  2 VIEW  COMPARISON:  06/19/2014 and earlier.  FINDINGS: Cardiac silhouette markedly enlarged. Pulmonary venous hypertension and mild interstitial pulmonary edema superimposed upon COPD. Bilateral pleural effusions and associated consolidation  in the lower lobes. Left subclavian dual lead transvenous pacemaker unchanged and appears intact.  IMPRESSION: 1. Mild CHF superimposed upon COPD. 2. Bilateral pleural effusions with associated passive atelectasis in the lower lobes.   Electronically Signed   By: Evangeline Dakin M.D.   On: 11/03/2014 19:13    ASSESSMENT/PLAN:  Generalized weakness - for rehabilitation  Acute diastolic CHF/ischemic cardiomyopathy - continue Lasix 20 mg 1 tab by mouth daily  CAD - continue Plavix 75 mg 1 tab by mouth daily and NTG when necessary  Essential hypertension - continue Toprol-XL 25 mg take 1/2 tab = 12.5 mg Daily  PAF - rate controlled; continue amiodarone 200 mg 1 tab by mouth daily and Toprol-XL 25 mg take 1/2 tab = 12.5 mg daily  Hypothyroidism - TSH 0.569; continue Synthroid 125 g by mouth daily  AAA - on surveillance  Anemia of chronic disease - hemoglobin 10.9; continue for sulfate 325 mg 1 tab by mouth daily  GERD - continue Protonix 40 mg 1 tab by mouth daily and Zantac 300 mg 1 tab by mouth daily at bedtime  Hypokalemia - K4.3; continue K Dur 20 meq 1 tab by mouth daily    Goals of care:  Short-term rehabilitation    [facility], NP [facility] [phone]

## 2014-11-11 ENCOUNTER — Non-Acute Institutional Stay (SKILLED_NURSING_FACILITY): Payer: Medicare Other | Admitting: Internal Medicine

## 2014-11-11 DIAGNOSIS — D638 Anemia in other chronic diseases classified elsewhere: Secondary | ICD-10-CM

## 2014-11-11 DIAGNOSIS — I509 Heart failure, unspecified: Secondary | ICD-10-CM | POA: Diagnosis not present

## 2014-11-11 DIAGNOSIS — E039 Hypothyroidism, unspecified: Secondary | ICD-10-CM

## 2014-11-11 DIAGNOSIS — I959 Hypotension, unspecified: Secondary | ICD-10-CM

## 2014-11-11 DIAGNOSIS — R531 Weakness: Secondary | ICD-10-CM | POA: Diagnosis not present

## 2014-11-11 DIAGNOSIS — I480 Paroxysmal atrial fibrillation: Secondary | ICD-10-CM | POA: Diagnosis not present

## 2014-11-11 DIAGNOSIS — E46 Unspecified protein-calorie malnutrition: Secondary | ICD-10-CM

## 2014-11-11 DIAGNOSIS — I2510 Atherosclerotic heart disease of native coronary artery without angina pectoris: Secondary | ICD-10-CM | POA: Diagnosis not present

## 2014-11-11 NOTE — Progress Notes (Signed)
Patient ID: [name], female   DOB: 07/17/1917, 81 y.o.   MRN: [id number]     [facility]   PCP: Lauree Chandler, NP  Code Status: DNR  Allergies  Allergen Reactions  . Nubain [Nalbuphine Hcl] Anaphylaxis  . Statins     Leg weakness  . Iodine Rash    Chief Complaint  Patient presents with  . New Admit To SNF     HPI:  81 y.o. patient is here for short term rehabilitation post hospital admission from 11/03/14-11/07/14 with chf exacerbation. She responded well to iv lasix. She was then switched to po lasix. She has PMH of diastolic CHF, CAD, PAF, sick sinus syndrome S/P pacemaker, hypertension and hypothyroidism. She is seen in her room today. Her breathing is stable. She has been having low blood pressure reading on review. She denies any dizziness, headache or lightheadedness. No falls reported.    Review of Systems:  Constitutional: positive for fatigue. Negative for fever, chills, diaphoresis.  HENT: Negative for headache, congestion, nasal discharge Eyes: Negative for eye pain, blurred vision, double vision and discharge.  Respiratory: Negative for cough, shortness of breath and wheezing.   Cardiovascular: Negative for chest pain, palpitations. Improved leg swelling.  Gastrointestinal: Negative for heartburn, nausea, vomiting, abdominal pain. Bowel movement 2 days back and has hx of chronic constipation Genitourinary: Negative for dysuria Musculoskeletal: Negative for back pain, falls Skin: Negative for itching, rash.  Neurological: Negative for dizziness, tingling, focal weakness Psychiatric/Behavioral: Negative for depression   Past Medical History  Diagnosis Date  . GI bleed 2007  . Hypertension   . TIA (transient ischemic attack)   . Hypothyroidism   . Abdominal aortic aneurysm   . CAD (coronary artery disease)   . SSS (sick sinus syndrome) 10/05/2007    Medtronic Adapta  . Myocardial infarction   . Atrial fibrillation    . COPD (chronic obstructive pulmonary disease)   . Asthma     hx  . Anemia     hx  . Blood transfusion   . UTI (lower urinary tract infection)     hx  . Renal failure, chronic   . H/O hiatal hernia   . GERD (gastroesophageal reflux disease)   . Stroke   . Osteoporosis   . Blood transfusion without reported diagnosis   . Pacemaker    Past Surgical History  Procedure Laterality Date  . Pacemaker insertion  10/05/2007    Medtronic Adapta  . Coronary stent placement      x9  . Cardiac catheterization    . Fracture surgery      femur fx, /w ORIF into HIP- 2008  . Total hip arthroplasty      planned for 08/27/2011 Left  . Total hip arthroplasty  08/27/2011    Procedure: TOTAL HIP ARTHROPLASTY;  Surgeon: Kerin Salen, MD;  Location: [place];  Service: Orthopedics;  Laterality: Right;  . US echocardiography  08/23/2011    EF 50%,LA mod. dilated,mild to mod. mitral annular ca+,mod. MR,mild to mod TR,mild to mod. PH,trace AI.  [name] Nm myocar perf wall motion  10/08/2010    mild apical anterior ischemia  . Colonoscopy     Social History:   reports that she has quit smoking. Her smoking use included Cigarettes. She has never used smokeless tobacco. She reports that she does not drink alcohol or use illicit drugs.  Family History  Problem Relation Age of Onset  . Emphysema Father   .  Heart disease Mother   . Cancer Brother   . Cancer Brother   . Cancer Sister   . Cancer Sister   . Diabetes Daughter   . Atrial fibrillation Daughter   . Atrial fibrillation Daughter   . Stroke Daughter   . Heart Problems Son     Medications:   Medication List       This list is accurate as of: 11/11/14  5:21 PM.  Always use your most recent med list.               amiodarone 200 MG tablet  Commonly known as:  PACERONE  TAKE 1 TABLET BY MOUTH DAILY.     Biotin 1000 MCG tablet  Take 1,000 mcg by mouth daily.     CALCIUM 600 + D PO  Take 1 tablet by mouth daily.     clopidogrel 75 MG  tablet  Commonly known as:  PLAVIX  Take 1 tablet (75 mg total) by mouth daily.     feeding supplement Liqd  Take 1 Container by mouth daily.     ferrous sulfate 325 (65 FE) MG tablet  Take 325 mg by mouth daily with breakfast.     furosemide 20 MG tablet  Commonly known as:  LASIX  Take 1 tablet (20 mg total) by mouth daily.     levothyroxine 125 MCG tablet  Commonly known as:  SYNTHROID, LEVOTHROID  TAKE 1 TABLET BY MOUTH EVERY DAY     metoprolol succinate 25 MG 24 hr tablet  Commonly known as:  TOPROL-XL  Take 12.5 mg by mouth daily.     nitroGLYCERIN 0.4 MG SL tablet  Commonly known as:  NITROSTAT  Place 1 tablet (0.4 mg total) under the tongue every 5 (five) minutes as needed for chest pain (For a total of 3 tablets, if chest pain persist to call 911).     pantoprazole 40 MG tablet  Commonly known as:  PROTONIX  TAKE 1 TABLET (40 MG TOTAL) BY MOUTH DAILY.     potassium chloride SA 20 MEQ tablet  Commonly known as:  K-DUR,KLOR-CON  Take 20 mEq by mouth daily.     ranitidine 300 MG tablet  Commonly known as:  ZANTAC  Take 300 mg by mouth at bedtime.     vitamin B-12 1000 MCG tablet  Commonly known as:  CYANOCOBALAMIN  Take 1,000 mcg by mouth daily.         Physical Exam: Filed Vitals:   11/11/14 1719  BP: 94/41  Pulse: 81  Temp: 98.6 F (37 C)  Resp: 17  Weight: 107 lb (48.535 kg)  SpO2: 90%    General- elderly female, in no acute distress Head- normocephalic, atraumatic, right ear hearing aid Throat- moist mucus membrane Eyes- PERRLA, EOMI, no pallor, no icterus, no discharge, normal conjunctiva, normal sclera Neck- no cervical lymphadenopathy, no JVD Cardiovascular- normal s1,s2, no murmurs, trace bilateral leg edema Respiratory- bilateral clear to auscultation, no wheeze, no rhonchi, no crackles, no use of accessory muscles Abdomen- bowel sounds present, soft, non tender Musculoskeletal- able to move all 4 extremities, generalized weakness    Neurological- no focal deficit, alert and oriented Skin- warm and dry Psychiatry- normal mood and affect    Labs reviewed: Basic Metabolic Panel:  Recent Labs  11/05/14 0350 11/06/14 0501 11/07/14 0525  NA 142 142 142  K 4.0 4.3 4.3  CL 100* 100* 100*  CO2 37* 35* 34*  GLUCOSE 96 82 77  BUN 24*  20 25*  CREATININE 1.81* 1.66* 1.75*  CALCIUM 8.6* 8.5* 8.5*   Liver Function Tests:  Recent Labs  06/19/14 2034 08/01/14 1401 11/03/14 1807  AST 59* 31 33  ALT 42* 17 16  ALKPHOS 76 91 89  BILITOT 0.7 0.3 0.4  PROT 6.2 6.0 6.3*  ALBUMIN 3.1*  --  3.2*   No results for input(s): LIPASE, AMYLASE in the last 8760 hours. No results for input(s): AMMONIA in the last 8760 hours. CBC:  Recent Labs  06/19/14 2034 08/01/14 1401 11/03/14 1807 11/07/14 0525  WBC 6.6 6.4 6.6 5.5  NEUTROABS 3.8 4.0 3.8  --   HGB 11.7*  --  11.7* 10.9*  HCT 37.2 38.0 38.3 35.7*  MCV 103.0*  --  105.5* 104.4*  PLT 106*  --  133* 132*   Cardiac Enzymes:  Recent Labs  11/03/14 1807  TROPONINI <0.03   BNP: Invalid input(s): POCBNP CBG:  Recent Labs  11/04/14 0829  GLUCAP 74    Radiological Exams: Dg Chest 2 View  11/03/2014   CLINICAL DATA:  81 year old with steadily worsening shortness of breath that began approximately 1 week ago.  EXAM: CHEST  2 VIEW  COMPARISON:  06/19/2014 and earlier.  FINDINGS: Cardiac silhouette markedly enlarged. Pulmonary venous hypertension and mild interstitial pulmonary edema superimposed upon COPD. Bilateral pleural effusions and associated consolidation in the lower lobes. Left subclavian dual lead transvenous pacemaker unchanged and appears intact.  IMPRESSION: 1. Mild CHF superimposed upon COPD. 2. Bilateral pleural effusions with associated passive atelectasis in the lower lobes.   Electronically Signed   By: Evangeline Dakin M.D.   On: 11/03/2014 19:13    Assessment/Plan  Generalized weakness Will have her work with physical therapy and  occupational therapy team to help with gait training and muscle strengthening exercises.fall precautions. Skin care. Encourage to be out of bed.   Hypotension Asymptomatic, check orthostatic and daily vitals for now, holding parameter added to metoprolol  Acute diastolic CHF Stable currently, monitor daily weight. continue toprol xl 12.5 mg daily but due to low bp, monitor vitals prior to administration. Continue lasix 20 mg daily and kcl supplement, monitor bmp  Protein calorie malnutrition Continue procel, encourage po intake and monitor weight  CAD Remains chest pain free. Continue metoprolol, plavix and prn NTG  afib Rate controlled. Continue amiodarone 200 mg daily and toprol 12.5 mg daily  Anemia of chronic disease Hb 10.8, continue ferrous sulfate 325 mg daily and monitor h&h  Hypothyroidism  continue Synthroid 125 mcg daily   Goals of care: short term rehabilitation   Labs/tests ordered: cbc, bmp  Family/ staff Communication: reviewed care plan with patient and nursing supervisor    Blanchie Serve, MD  [facility] [phone] (Monday-Friday 8 am - 5 pm) [phone] (afterhours)

## 2014-11-29 ENCOUNTER — Encounter: Payer: Self-pay | Admitting: Adult Health

## 2014-12-01 DIAGNOSIS — I2510 Atherosclerotic heart disease of native coronary artery without angina pectoris: Secondary | ICD-10-CM | POA: Diagnosis not present

## 2014-12-01 DIAGNOSIS — I480 Paroxysmal atrial fibrillation: Secondary | ICD-10-CM | POA: Diagnosis not present

## 2014-12-01 DIAGNOSIS — I5031 Acute diastolic (congestive) heart failure: Secondary | ICD-10-CM | POA: Diagnosis not present

## 2014-12-01 DIAGNOSIS — R2689 Other abnormalities of gait and mobility: Secondary | ICD-10-CM | POA: Diagnosis not present

## 2014-12-01 DIAGNOSIS — M810 Age-related osteoporosis without current pathological fracture: Secondary | ICD-10-CM | POA: Diagnosis not present

## 2014-12-01 DIAGNOSIS — I129 Hypertensive chronic kidney disease with stage 1 through stage 4 chronic kidney disease, or unspecified chronic kidney disease: Secondary | ICD-10-CM | POA: Diagnosis not present

## 2014-12-04 DIAGNOSIS — I5031 Acute diastolic (congestive) heart failure: Secondary | ICD-10-CM | POA: Diagnosis not present

## 2014-12-04 DIAGNOSIS — I129 Hypertensive chronic kidney disease with stage 1 through stage 4 chronic kidney disease, or unspecified chronic kidney disease: Secondary | ICD-10-CM | POA: Diagnosis not present

## 2014-12-04 DIAGNOSIS — I480 Paroxysmal atrial fibrillation: Secondary | ICD-10-CM | POA: Diagnosis not present

## 2014-12-04 DIAGNOSIS — R2689 Other abnormalities of gait and mobility: Secondary | ICD-10-CM | POA: Diagnosis not present

## 2014-12-04 DIAGNOSIS — I2510 Atherosclerotic heart disease of native coronary artery without angina pectoris: Secondary | ICD-10-CM | POA: Diagnosis not present

## 2014-12-04 DIAGNOSIS — M810 Age-related osteoporosis without current pathological fracture: Secondary | ICD-10-CM | POA: Diagnosis not present

## 2014-12-09 ENCOUNTER — Ambulatory Visit (INDEPENDENT_AMBULATORY_CARE_PROVIDER_SITE_OTHER): Payer: Medicare Other | Admitting: Internal Medicine

## 2014-12-09 ENCOUNTER — Encounter: Payer: Self-pay | Admitting: Internal Medicine

## 2014-12-09 VITALS — BP 118/72 | HR 94 | Temp 97.4°F | Resp 20 | Ht 63.0 in | Wt 106.8 lb

## 2014-12-09 DIAGNOSIS — I5031 Acute diastolic (congestive) heart failure: Secondary | ICD-10-CM | POA: Diagnosis not present

## 2014-12-09 DIAGNOSIS — I2510 Atherosclerotic heart disease of native coronary artery without angina pectoris: Secondary | ICD-10-CM | POA: Diagnosis not present

## 2014-12-09 DIAGNOSIS — I951 Orthostatic hypotension: Secondary | ICD-10-CM | POA: Diagnosis not present

## 2014-12-09 DIAGNOSIS — E46 Unspecified protein-calorie malnutrition: Secondary | ICD-10-CM

## 2014-12-09 DIAGNOSIS — I480 Paroxysmal atrial fibrillation: Secondary | ICD-10-CM | POA: Diagnosis not present

## 2014-12-09 DIAGNOSIS — Z23 Encounter for immunization: Secondary | ICD-10-CM | POA: Diagnosis not present

## 2014-12-09 DIAGNOSIS — R531 Weakness: Secondary | ICD-10-CM

## 2014-12-09 NOTE — Progress Notes (Signed)
Patient ID: [name], female   DOB: [birth year], 81 y.o.   MRN: [id number]   Location:  [facility]  Code Status: DNR Goals of Care: Advanced Directive information Does patient have an advance directive?: Yes, Type of Advance Directive: Out of facility DNR (pink MOST or yellow form)   Chief Complaint  Patient presents with  . Hospitalization Follow-up    h    HPI: Patient is a 81 y.o. white female seen in the office today for a hospital f/u.  She was discharged from [facility] on 11/29/14 after rehab.    She had stopped diuretics and legs got very swollen.  Got worse and became short of breath until she had to go to the hospital.  Was in acute diastolic chf.  Echo reviewed.  Had cellulitis, also.  Then got thrush from the antibiotics.  Came home Saturday from [facility] where she had rehab for strengthening.  Also had lost her muscle mass in her legs.  Is back on lasix Mon/Wed/Fri.   Does not eat much.  Eats a biscuit or grits or 1/2 PB&J.  Has been losing weight.  Also does not drink fluids.  She would never want a feeding tube.  Is taking one boost per day.  Would take protein powder in cranberry juice.    Weight stable since SNF d/c.    Says her biggest problem is her hemorrhoids which bleed and are painful.  Has chronic constipation, but doesn't eat and drink much.  Uses anusol or vaseline.    No falls.    Back to living by herself.  Uses walker in the house and cane when out.  No chest pains since she's been home.    Review of Systems:  Review of Systems  Constitutional: Positive for weight loss. Negative for fever, chills and malaise/fatigue.  HENT: Positive for hearing loss. Negative for congestion.   Eyes:       Glasses  Respiratory: Negative for cough and shortness of breath.   Cardiovascular: Negative for chest pain and leg swelling.  Gastrointestinal: Negative for abdominal pain.  Genitourinary: Negative for dysuria.    Musculoskeletal: Negative for falls.  Neurological: Negative for dizziness and weakness.  Psychiatric/Behavioral: Negative for depression and memory loss.    Past Medical History  Diagnosis Date  . GI bleed 2007  . Hypertension   . TIA (transient ischemic attack)   . Hypothyroidism   . Abdominal aortic aneurysm   . CAD (coronary artery disease)   . SSS (sick sinus syndrome) 10/05/2007    Medtronic Adapta  . Myocardial infarction   . Atrial fibrillation   . COPD (chronic obstructive pulmonary disease)   . Asthma     hx  . Anemia     hx  . Blood transfusion   . UTI (lower urinary tract infection)     hx  . Renal failure, chronic   . H/O hiatal hernia   . GERD (gastroesophageal reflux disease)   . Stroke   . Osteoporosis   . Blood transfusion without reported diagnosis   . Pacemaker     Past Surgical History  Procedure Laterality Date  . Pacemaker insertion  10/05/2007    Medtronic Adapta  . Coronary stent placement      x9  . Cardiac catheterization    . Fracture surgery      femur fx, /w ORIF into HIP- 2008  . Total hip arthroplasty  planned for 08/27/2011 Left  . Total hip arthroplasty  08/27/2011    Procedure: TOTAL HIP ARTHROPLASTY;  Surgeon: Kerin Salen, MD;  Location: [place];  Service: Orthopedics;  Laterality: Right;  . US echocardiography  08/23/2011    EF 50%,LA mod. dilated,mild to mod. mitral annular ca+,mod. MR,mild to mod TR,mild to mod. PH,trace AI.  [name] Nm myocar perf wall motion  10/08/2010    mild apical anterior ischemia  . Colonoscopy      Allergies  Allergen Reactions  . Nubain [Nalbuphine Hcl] Anaphylaxis  . Statins     Leg weakness  . Iodine Rash   Medications: Patient's Medications  New Prescriptions   No medications on file  Previous Medications   AMIODARONE (PACERONE) 200 MG TABLET    TAKE 1 TABLET BY MOUTH DAILY.   BIOTIN 1000 MCG TABLET    Take 1,000 mcg by mouth daily.   CALCIUM CARB-CHOLECALCIFEROL (CALCIUM 600 + D PO)     Take 1 tablet by mouth daily.   CLOPIDOGREL (PLAVIX) 75 MG TABLET    Take 1 tablet (75 mg total) by mouth daily.   FEEDING SUPPLEMENT (BOOST HIGH PROTEIN) LIQD    Take 1 Container by mouth daily.   FERROUS SULFATE 325 (65 FE) MG TABLET    Take 325 mg by mouth daily with breakfast.    FUROSEMIDE (LASIX) 20 MG TABLET    Take 1 tablet (20 mg total) by mouth daily.   LEVOTHYROXINE (SYNTHROID, LEVOTHROID) 125 MCG TABLET    TAKE 1 TABLET BY MOUTH EVERY DAY   METOPROLOL SUCCINATE (TOPROL-XL) 25 MG 24 HR TABLET    Take 12.5 mg by mouth daily.   NITROGLYCERIN (NITROSTAT) 0.4 MG SL TABLET    Place 1 tablet (0.4 mg total) under the tongue every 5 (five) minutes as needed for chest pain (For a total of 3 tablets, if chest pain persist to call 911).   PANTOPRAZOLE (PROTONIX) 40 MG TABLET    TAKE 1 TABLET (40 MG TOTAL) BY MOUTH DAILY.   POTASSIUM CHLORIDE SA (K-DUR,KLOR-CON) 20 MEQ TABLET    Take 20 mEq by mouth daily.   RANITIDINE (ZANTAC) 300 MG TABLET    Take 300 mg by mouth at bedtime.    VITAMIN B-12 (CYANOCOBALAMIN) 1000 MCG TABLET    Take 1,000 mcg by mouth daily.  Modified Medications   No medications on file  Discontinued Medications   No medications on file    Physical Exam: Filed Vitals:   12/09/14 1507  BP: 118/72  Pulse: 94  Temp: 97.4 F (36.3 C)  TempSrc: Oral  Resp: 20  Height: 5\' 3"  (1.6 m)  Weight: 106 lb 12.8 oz (48.444 kg)  SpO2: 93%   Physical Exam  Constitutional: She is oriented to person, place, and time. She appears well-developed and well-nourished. No distress.  Thin white female  HENT:  Head: Normocephalic and atraumatic.  Hearing aide  Eyes:  glasses  Cardiovascular: Normal rate, regular rhythm, normal heart sounds and intact distal pulses.   Pulmonary/Chest: Effort normal and breath sounds normal. She has no rales.  Abdominal: Soft. Bowel sounds are normal. She exhibits no distension. There is no tenderness.  Musculoskeletal: Normal range of motion.  Walks  with cane  Neurological: She is alert and oriented to person, place, and time.  Skin: Skin is warm and dry.  Ecchymoses over bilateral hands  Psychiatric: She has a normal mood and affect.    Labs reviewed: Basic Metabolic Panel:  Recent Labs  08/01/14 1401  11/05/14 0350 11/06/14 0501 11/07/14 0525  NA 144  < > 142 142 142  K 4.7  < > 4.0 4.3 4.3  CL 100  < > 100* 100* 100*  CO2 30*  < > 37* 35* 34*  GLUCOSE 88  < > 96 82 77  BUN 24  < > 24* 20 25*  CREATININE 1.37*  < > 1.81* 1.66* 1.75*  CALCIUM 9.2  < > 8.6* 8.5* 8.5*  TSH 0.569  --   --   --   --   < > = values in this interval not displayed. Liver Function Tests:  Recent Labs  06/19/14 2034 08/01/14 1401 11/03/14 1807  AST 59* 31 33  ALT 42* 17 16  ALKPHOS 76 91 89  BILITOT 0.7 0.3 0.4  PROT 6.2 6.0 6.3*  ALBUMIN 3.1*  --  3.2*   No results for input(s): LIPASE, AMYLASE in the last 8760 hours. No results for input(s): AMMONIA in the last 8760 hours. CBC:  Recent Labs  06/19/14 2034 08/01/14 1401 11/03/14 1807 11/07/14 0525  WBC 6.6 6.4 6.6 5.5  NEUTROABS 3.8 4.0 3.8  --   HGB 11.7*  --  11.7* 10.9*  HCT 37.2 38.0 38.3 35.7*  MCV 103.0*  --  105.5* 104.4*  PLT 106*  --  133* 132*   Lipid Panel: No results for input(s): CHOL, HDL, LDLCALC, TRIG, CHOLHDL, LDLDIRECT in the last 8760 hours. Lab Results  Component Value Date   HGBA1C  02/27/2007    5.8 (NOTE)   The ADA recommends the following therapeutic goals for glycemic   control related to Hgb A1C measurement:   Goal of Therapy:   < 7.0% Hgb A1C   Action Suggested:  > 8.0% Hgb A1C   Ref:  Diabetes Care, 22, Suppl. 1, [facility] records reviewed including labs, cardiovascular studies and imaging.  Assessment/Plan 1. Acute diastolic CHF (congestive heart failure) -acute resolved, cont lasix 20mg  three times weekly and potassium supplement, eating bananas, boost  2. Generalized weakness -gradually has improved, is back at home by herself,  but family check on her regularly--she wants to be independent and threatens to send them home if they get too bossy  3. Orthostatic hypotension -better since reduction of metoprolol dosage to 25mg  -advised to still change positions slowly and always use her cane or walker  4. Protein-calorie malnutrition -cont prostat powder supplement and daily boost and multiple small meals  5. Paroxysmal atrial fibrillation -continues on amiodarone which was new, reduced dose metoprolol and plavix as anticoagulant  6. Coronary artery disease involving native coronary artery of native heart without angina pectoris -stable, has not required NTG since she's been home, had previously been getting chest pains about 1x per month  7. Need for prophylactic vaccination and inoculation against influenza - Flu Vaccine QUAD 36+ mos PF IM (Fluarix & Fluzone Quad PF) was given  8. Need for prophylactic vaccination against Streptococcus pneumoniae (pneumococcus) - Pneumococcal conjugate vaccine 13-valent was given  Labs/tests ordered:  No new--will f/u bmp at that appt Next appt:  2 mos with Janett Billow for med [employer] Valentina Alcoser, D.O. [facility] [address], [place] [address] Cell Phone (Mon-Fri 8am-5pm):  [phone] On Call:  [phone] & follow prompts after 5pm & weekends Office Phone:  [phone] Office Fax:  [phone]

## 2014-12-10 DIAGNOSIS — I2510 Atherosclerotic heart disease of native coronary artery without angina pectoris: Secondary | ICD-10-CM | POA: Diagnosis not present

## 2014-12-10 DIAGNOSIS — M810 Age-related osteoporosis without current pathological fracture: Secondary | ICD-10-CM | POA: Diagnosis not present

## 2014-12-10 DIAGNOSIS — R2689 Other abnormalities of gait and mobility: Secondary | ICD-10-CM | POA: Diagnosis not present

## 2014-12-10 DIAGNOSIS — I129 Hypertensive chronic kidney disease with stage 1 through stage 4 chronic kidney disease, or unspecified chronic kidney disease: Secondary | ICD-10-CM | POA: Diagnosis not present

## 2014-12-10 DIAGNOSIS — I480 Paroxysmal atrial fibrillation: Secondary | ICD-10-CM | POA: Diagnosis not present

## 2014-12-10 DIAGNOSIS — I5031 Acute diastolic (congestive) heart failure: Secondary | ICD-10-CM | POA: Diagnosis not present

## 2014-12-12 DIAGNOSIS — M810 Age-related osteoporosis without current pathological fracture: Secondary | ICD-10-CM | POA: Diagnosis not present

## 2014-12-12 DIAGNOSIS — I5031 Acute diastolic (congestive) heart failure: Secondary | ICD-10-CM | POA: Diagnosis not present

## 2014-12-12 DIAGNOSIS — I129 Hypertensive chronic kidney disease with stage 1 through stage 4 chronic kidney disease, or unspecified chronic kidney disease: Secondary | ICD-10-CM | POA: Diagnosis not present

## 2014-12-12 DIAGNOSIS — R2689 Other abnormalities of gait and mobility: Secondary | ICD-10-CM | POA: Diagnosis not present

## 2014-12-12 DIAGNOSIS — I2510 Atherosclerotic heart disease of native coronary artery without angina pectoris: Secondary | ICD-10-CM | POA: Diagnosis not present

## 2014-12-12 DIAGNOSIS — I480 Paroxysmal atrial fibrillation: Secondary | ICD-10-CM | POA: Diagnosis not present

## 2014-12-17 DIAGNOSIS — I5031 Acute diastolic (congestive) heart failure: Secondary | ICD-10-CM | POA: Diagnosis not present

## 2014-12-17 DIAGNOSIS — I480 Paroxysmal atrial fibrillation: Secondary | ICD-10-CM | POA: Diagnosis not present

## 2014-12-17 DIAGNOSIS — I2510 Atherosclerotic heart disease of native coronary artery without angina pectoris: Secondary | ICD-10-CM | POA: Diagnosis not present

## 2014-12-17 DIAGNOSIS — I129 Hypertensive chronic kidney disease with stage 1 through stage 4 chronic kidney disease, or unspecified chronic kidney disease: Secondary | ICD-10-CM | POA: Diagnosis not present

## 2014-12-17 DIAGNOSIS — M810 Age-related osteoporosis without current pathological fracture: Secondary | ICD-10-CM | POA: Diagnosis not present

## 2014-12-17 DIAGNOSIS — R2689 Other abnormalities of gait and mobility: Secondary | ICD-10-CM | POA: Diagnosis not present

## 2014-12-19 DIAGNOSIS — I2510 Atherosclerotic heart disease of native coronary artery without angina pectoris: Secondary | ICD-10-CM | POA: Diagnosis not present

## 2014-12-19 DIAGNOSIS — I5031 Acute diastolic (congestive) heart failure: Secondary | ICD-10-CM | POA: Diagnosis not present

## 2014-12-19 DIAGNOSIS — I129 Hypertensive chronic kidney disease with stage 1 through stage 4 chronic kidney disease, or unspecified chronic kidney disease: Secondary | ICD-10-CM | POA: Diagnosis not present

## 2014-12-19 DIAGNOSIS — I480 Paroxysmal atrial fibrillation: Secondary | ICD-10-CM | POA: Diagnosis not present

## 2014-12-19 DIAGNOSIS — R2689 Other abnormalities of gait and mobility: Secondary | ICD-10-CM | POA: Diagnosis not present

## 2014-12-19 DIAGNOSIS — M810 Age-related osteoporosis without current pathological fracture: Secondary | ICD-10-CM | POA: Diagnosis not present

## 2014-12-22 DIAGNOSIS — M810 Age-related osteoporosis without current pathological fracture: Secondary | ICD-10-CM | POA: Diagnosis not present

## 2014-12-22 DIAGNOSIS — R2689 Other abnormalities of gait and mobility: Secondary | ICD-10-CM | POA: Diagnosis not present

## 2014-12-22 DIAGNOSIS — I5031 Acute diastolic (congestive) heart failure: Secondary | ICD-10-CM | POA: Diagnosis not present

## 2014-12-22 DIAGNOSIS — I480 Paroxysmal atrial fibrillation: Secondary | ICD-10-CM | POA: Diagnosis not present

## 2014-12-22 DIAGNOSIS — I129 Hypertensive chronic kidney disease with stage 1 through stage 4 chronic kidney disease, or unspecified chronic kidney disease: Secondary | ICD-10-CM | POA: Diagnosis not present

## 2014-12-22 DIAGNOSIS — I2510 Atherosclerotic heart disease of native coronary artery without angina pectoris: Secondary | ICD-10-CM | POA: Diagnosis not present

## 2014-12-26 DIAGNOSIS — M810 Age-related osteoporosis without current pathological fracture: Secondary | ICD-10-CM | POA: Diagnosis not present

## 2014-12-26 DIAGNOSIS — R2689 Other abnormalities of gait and mobility: Secondary | ICD-10-CM | POA: Diagnosis not present

## 2014-12-26 DIAGNOSIS — I480 Paroxysmal atrial fibrillation: Secondary | ICD-10-CM | POA: Diagnosis not present

## 2014-12-26 DIAGNOSIS — I2510 Atherosclerotic heart disease of native coronary artery without angina pectoris: Secondary | ICD-10-CM | POA: Diagnosis not present

## 2014-12-26 DIAGNOSIS — I5031 Acute diastolic (congestive) heart failure: Secondary | ICD-10-CM | POA: Diagnosis not present

## 2014-12-26 DIAGNOSIS — I129 Hypertensive chronic kidney disease with stage 1 through stage 4 chronic kidney disease, or unspecified chronic kidney disease: Secondary | ICD-10-CM | POA: Diagnosis not present

## 2014-12-31 ENCOUNTER — Encounter: Payer: Medicare Other | Admitting: Cardiovascular Disease

## 2015-01-22 ENCOUNTER — Encounter: Payer: Self-pay | Admitting: Cardiovascular Disease

## 2015-01-22 ENCOUNTER — Ambulatory Visit (INDEPENDENT_AMBULATORY_CARE_PROVIDER_SITE_OTHER): Payer: Medicare Other | Admitting: Cardiovascular Disease

## 2015-01-22 VITALS — BP 102/65 | HR 84 | Ht 61.0 in | Wt 105.5 lb

## 2015-01-22 DIAGNOSIS — I5031 Acute diastolic (congestive) heart failure: Secondary | ICD-10-CM

## 2015-01-22 DIAGNOSIS — I495 Sick sinus syndrome: Secondary | ICD-10-CM

## 2015-01-22 DIAGNOSIS — I2510 Atherosclerotic heart disease of native coronary artery without angina pectoris: Secondary | ICD-10-CM

## 2015-01-22 DIAGNOSIS — E785 Hyperlipidemia, unspecified: Secondary | ICD-10-CM

## 2015-01-22 DIAGNOSIS — I714 Abdominal aortic aneurysm, without rupture, unspecified: Secondary | ICD-10-CM

## 2015-01-22 DIAGNOSIS — I480 Paroxysmal atrial fibrillation: Secondary | ICD-10-CM

## 2015-01-22 DIAGNOSIS — I739 Peripheral vascular disease, unspecified: Secondary | ICD-10-CM | POA: Diagnosis not present

## 2015-01-22 LAB — CBC
HCT: 36.5 % (ref 36.0–46.0)
Hemoglobin: 11.8 g/dL — ABNORMAL LOW (ref 12.0–15.0)
MCH: 31.3 pg (ref 26.0–34.0)
MCHC: 32.3 g/dL (ref 30.0–36.0)
MCV: 96.8 fL (ref 78.0–100.0)
MPV: 11.1 fL (ref 8.6–12.4)
Platelets: 165 K/uL (ref 150–400)
RBC: 3.77 MIL/uL — ABNORMAL LOW (ref 3.87–5.11)
RDW: 14 % (ref 11.5–15.5)
WBC: 6.3 K/uL (ref 4.0–10.5)

## 2015-01-22 LAB — COMPREHENSIVE METABOLIC PANEL
ALBUMIN: 3 g/dL — AB (ref 3.6–5.1)
ALT: 31 U/L — ABNORMAL HIGH (ref 6–29)
AST: 50 U/L — ABNORMAL HIGH (ref 10–35)
Alkaline Phosphatase: 70 U/L (ref 33–130)
BUN: 18 mg/dL (ref 7–25)
CALCIUM: 8.4 mg/dL — AB (ref 8.6–10.4)
CHLORIDE: 99 mmol/L (ref 98–110)
CO2: 32 mmol/L — AB (ref 20–31)
CREATININE: 1.16 mg/dL — AB (ref 0.60–0.88)
Glucose, Bld: 85 mg/dL (ref 65–99)
POTASSIUM: 5 mmol/L (ref 3.5–5.3)
SODIUM: 141 mmol/L (ref 135–146)
TOTAL PROTEIN: 5.7 g/dL — AB (ref 6.1–8.1)
Total Bilirubin: 0.6 mg/dL (ref 0.2–1.2)

## 2015-01-22 LAB — TSH: TSH: 0.631 u[IU]/mL (ref 0.350–4.500)

## 2015-01-22 NOTE — Patient Instructions (Signed)
Remote monitoring is used to monitor your Pacemaker of ICD from home. This monitoring reduces the number of office visits required to check your device to one time per year. It allows [place] to keep an eye on the functioning of your device to ensure it is working properly. You are scheduled for a device check from home on 04-23-15. You may send your transmission at any time that day. If you have a wireless device, the transmission will be sent automatically. After your physician reviews your transmission, you will receive a postcard with your next transmission date.   Your physician wants you to follow-up in: [place] will receive a reminder letter in the mail two months in advance. If you don't receive a letter, please call our office to schedule the follow-up appointment.   Your physician recommends that you TODAY

## 2015-01-23 ENCOUNTER — Telehealth: Payer: Self-pay | Admitting: *Deleted

## 2015-01-23 DIAGNOSIS — Z79899 Other long term (current) drug therapy: Secondary | ICD-10-CM

## 2015-01-23 NOTE — Telephone Encounter (Signed)
-----   Message from Sanda Klein, MD sent at 01/23/2015 11:52 AM EST ----- Labs generally OK. Kidney function better than before. Thyroid OK. Very very mild abnormality in liver tests - would like to recheck CMET in one month please. Albumin low  - needs to eat more.Marland KitchenMarland Kitchen

## 2015-01-23 NOTE — Telephone Encounter (Signed)
Lab results called to daughter [name].  Will get repeat labs in one month (mailed order to patient).  Will try to get her mother to eat and drink more.

## 2015-01-24 ENCOUNTER — Inpatient Hospital Stay (HOSPITAL_COMMUNITY): Admission: RE | Admit: 2015-01-24 | Payer: Medicare Other | Source: Ambulatory Visit

## 2015-01-24 NOTE — Progress Notes (Signed)
Patient ID: [name], female   DOB: Aug 15, 1917, 81 y.o.   MRN: [id number]      Cardiology Office Note   Date:  01/24/2015   ID:  [name], DOB Aug 05, 1917, MRN [id number]  PCP:  Lauree Chandler, NP  Cardiologist:   Sanda Klein, MD   Chief Complaint  Patient presents with  . Annual Exam    patient reports shortness of breath with very minimal exertion and weakness, and wants to sleep all the time. daughter reports she is having difficulty swallowing and as a result she isn't eating as much      History of Present Illness: [name] is a 81 y.o. female who presents for follow-up for her pacemaker.  Her daughter (also my patient) accompanies her.The principal complaint is that of persistent fatigue. She is always weak and tired. She is sleepy all day long. She is losing weight since her intake of food has diminished. She is now frankly underweight with a BMI less than 20. She is taking boost high protein supplements.  She has not had problems with exertional dyspnea , but rather fatigue. She does not have chest pain and has not had syncope. She has not had any bleeding problems or clearcut focal neurological symptoms.   there was an attempt to discontinue her furosemide, I think due to abnormalities in potassium level and renal function, but she developed severe edema and dyspnea and was hospitalized. Furosemide has been restarted on an every other day basis   Interrogation of her dual-chamber Medtronic Adapta pacemaker implanted in 2009 shows an estimated battery longevity of roughly 12 months (range 1-23 months). Her burden of atrial fibrillation is very low at 0.2%. She has had 47 episodes of atrial fibrillation since her last device check, but most of these are extremely brief. The last one occurred about a week ago for a total of 13 minutes and 48 seconds. Ventricular rate during the episode was normal at 93 bpm average. She is not pacemaker dependent, but has virtually  100% atrial paced ventricular sensed rhythm.  She has an extensive and very complicated past history   CAD: In 1997 she underwent urgent stenting of the proximal LAD artery for NSTEMI and then underwent a staged stent to the left circumflex coronary artery, complicated by frequent nonsustained ventricular tachycardia. A planned staged intervention to the right coronary artery was performed later the same year.  She required repeat stenting of all 3 coronary arteries in 2006, when she presented with unstable angina. These procedures were performed on 3 consecutive days and a rather complicated fashion. Drug-eluting stents were placed in the proximal LAD and proximal RCA, with bare-metal stents in the proximal left circumflex and mid LAD. There was residual 50% stenosis of the left main coronary artery, but she has not required any revascularization procedure since.  Her last nuclear stress test performed in 2012 and showed low risk findings.    CHF: she bears a diagnosis of diastolic heart failure and takes a very low dose of loop diuretic.  Attempts to discontinue her furosemide lead to marked edema.  PAD: She has an infrarenal abdominal aortic aneurysm has been recognized for 18 years without much progression. Most recent ultrasound in May of 2015 measured at 3.8x3.8 cm. Has received a previous stent to the right common iliac artery and has mild bilateral stenoses in both iliac arteries by her most recent ultrasound performed in 2014. She does not have intermittent claudication. Bilateral ABIs were greater  than 1.0. Stable <50% bilateral internal carotid artery lesions, also unchanged for several years.   Arrhythmia: She has a history of sinus node dysfunction and paroxysmal atrial fibrillation and received a dual-chamber Medtronic adapta pacemaker in 2009. 1 episode of atrial fibrillation that lasted for over 5 hours occurring last in May. Atrial pacing occurs virtually 100% of the time.  Ventricular pacing occurs only 1.2% of the time. She failed multaq antiarrhythmic therapy and is now receiving amiodarone in a low dose.   Risk factors: She has been intolerant to numerous statins including pravastatin, atorvastatin, simvastatin and daily or weekly Crestor.    Significant comorbidities also include treated hypothyroidism. She does not have diabetes mellitus and quit smoking decades ago. She has had multiple repeat surgeries for her left hip with the most recent revision being in June of 2013. She uses a walker and occasionally gets around with a cane.    Past Medical History  Diagnosis Date  . GI bleed 2007  . Hypertension   . TIA (transient ischemic attack)   . Hypothyroidism   . Abdominal aortic aneurysm ([place])   . CAD (coronary artery disease)   . SSS (sick sinus syndrome) ([place]) 10/05/2007    Medtronic Adapta  . Myocardial infarction ([place])   . Atrial fibrillation ([place])   . COPD (chronic obstructive pulmonary disease) ([place])   . Asthma     hx  . Anemia     hx  . Blood transfusion   . UTI (lower urinary tract infection)     hx  . Renal failure, chronic   . H/O hiatal hernia   . GERD (gastroesophageal reflux disease)   . Stroke ([place])   . Osteoporosis   . Blood transfusion without reported diagnosis   . Pacemaker     Past Surgical History  Procedure Laterality Date  . Pacemaker insertion  10/05/2007    Medtronic Adapta  . Coronary stent placement      x9  . Cardiac catheterization    . Fracture surgery      femur fx, /w ORIF into HIP- 2008  . Total hip arthroplasty      planned for 08/27/2011 Left  . Total hip arthroplasty  08/27/2011    Procedure: TOTAL HIP ARTHROPLASTY;  Surgeon: Kerin Salen, MD;  Location: [place];  Service: Orthopedics;  Laterality: Right;  . US echocardiography  08/23/2011    EF 50%,LA mod. dilated,mild to mod. mitral annular ca+,mod. MR,mild to mod TR,mild to mod. PH,trace AI.  [name] Nm myocar perf wall motion  10/08/2010    mild apical  anterior ischemia  . Colonoscopy       Current Outpatient Prescriptions  Medication Sig Dispense Refill  . amiodarone (PACERONE) 200 MG tablet TAKE 1 TABLET BY MOUTH DAILY. 30 tablet 4  . Biotin 1000 MCG tablet Take 1,000 mcg by mouth daily.    . Calcium Carb-Cholecalciferol (CALCIUM 600 + D PO) Take 1 tablet by mouth daily.    . clopidogrel (PLAVIX) 75 MG tablet Take 1 tablet (75 mg total) by mouth daily. 90 tablet 1  . feeding supplement (BOOST HIGH PROTEIN) LIQD Take 1 Container by mouth daily.    . ferrous sulfate 325 (65 FE) MG tablet Take 325 mg by mouth daily with breakfast.     . furosemide (LASIX) 20 MG tablet Take 1 tablet (20 mg total) by mouth daily. 30 tablet 3  . levothyroxine (SYNTHROID, LEVOTHROID) 125 MCG tablet TAKE 1 TABLET BY MOUTH EVERY DAY 30  tablet 0  . metoprolol succinate (TOPROL-XL) 25 MG 24 hr tablet Take 12.5 mg by mouth daily.    . nitroGLYCERIN (NITROSTAT) 0.4 MG SL tablet Place 1 tablet (0.4 mg total) under the tongue every 5 (five) minutes as needed for chest pain (For a total of 3 tablets, if chest pain persist to call 911). 50 tablet 3  . pantoprazole (PROTONIX) 40 MG tablet TAKE 1 TABLET (40 MG TOTAL) BY MOUTH DAILY. 30 tablet 3  . potassium chloride SA (K-DUR,KLOR-CON) 20 MEQ tablet Take 20 mEq by mouth daily.    . ranitidine (ZANTAC) 300 MG tablet Take 300 mg by mouth at bedtime.     . vitamin B-12 (CYANOCOBALAMIN) 1000 MCG tablet Take 1,000 mcg by mouth daily.     No current facility-administered medications for this visit.    Allergies:   Nubain; Statins; and Iodine    Social History:  The patient  reports that she has quit smoking. Her smoking use included Cigarettes. She has never used smokeless tobacco. She reports that she does not drink alcohol or use illicit drugs.   Family History:  The patient's family history includes Atrial fibrillation in her daughter and daughter; Cancer in her brother, brother, sister, and sister; Diabetes in her  daughter; Emphysema in her father; Heart Problems in her son; Heart disease in her mother; Stroke in her daughter.    ROS:  Please see the history of present illness.    Otherwise, review of systems positive for none.   All other systems are reviewed and negative.    PHYSICAL EXAM: VS:  BP 102/65 mmHg  Pulse 84  Ht 5\' 1"  (1.549 m)  Wt 105 lb 8 oz (47.854 kg)  BMI 19.94 kg/m2 , BMI Body mass index is 19.94 kg/(m^2).  General: Alert, oriented x3, no distress.  Very thin and frail and hard of hearing Head: no evidence of trauma, PERRL, EOMI, no exophtalmos or lid lag, no myxedema, no xanthelasma; normal ears, nose and oropharynx Neck: normal jugular venous pulsations and no hepatojugular reflux; brisk carotid pulses without delay and no carotid bruits Chest: clear to auscultation, no signs of consolidation by percussion or palpation, normal fremitus, symmetrical and full respiratory excursions Cardiovascular: normal position and quality of the apical impulse, regular rhythm, normal first and second heart sounds,  2/6 early peaking aortic ejection murmurno  diastolic murmurs, rubs or gallops Abdomen: no tenderness or distention, no masses by palpation, no abnormal pulsatility or arterial bruits, normal bowel sounds, no hepatosplenomegaly Extremities: no clubbing, cyanosis or edema; 2+ radial, ulnar and brachial pulses bilaterally; 2+ right femoral, posterior tibial and dorsalis pedis pulses; 2+ left femoral, posterior tibial and dorsalis pedis pulses; no subclavian or femoral bruits Neurological: grossly nonfocal Psych: euthymic mood, full affect   EKG:  EKG is ordered today. The ekg ordered today demonstrates  Atrial paced ventricular sensed rhythm with long AV delay 268 ms, incomplete left bundle branch block QRS 106 ms, QTC 457 ms   Recent Labs: 11/03/2014: B Natriuretic Peptide 377.4* 01/22/2015: ALT 31*; BUN 18; Creat 1.16*; Hemoglobin 11.8*; Platelets 165; Potassium 5.0; Sodium 141;  TSH 0.631    Lipid Panel    Component Value Date/Time   CHOL 182 12/01/2012 1035   TRIG 118 12/01/2012 1035   HDL 56 12/01/2012 1035   CHOLHDL 3.3 12/01/2012 1035   VLDL 24 12/01/2012 1035   LDLCALC 102* 12/01/2012 1035      Wt Readings from Last 3 Encounters:  01/22/15 105 lb 8  oz (47.854 kg)  12/09/14 106 lb 12.8 oz (48.444 kg)  11/11/14 107 lb (48.535 kg)      Other studies Reviewed: Additional studies/ records that were reviewed today include:  Records from Dr. Mariea Clonts  ASSESSMENT AND PLAN:  CAD (coronary artery disease)  Complicated history of multiple percutaneous revascularization procedures and residual stenosis of the left main coronary artery. Thankfully right now she is asymptomatic and her most recent functional study shows low risk findings. I would be hard pressed to ever recommend further revascularization procedures in this nonagenarian. Focus on symptom control and risk factor treatment.   Hyperlipidemia Statin intolerant   Paroxysmal atrial fibrillation  Amiodarone seems to be doing a good job of controlling her arrhythmia. No significant atrial fibrillation since the 5 hour episode that occurred in May of 2014. Her risk of embolic events is high, as is her risk of bleeding complications. This has previously been thought out and discussed with the patient and her family. She is on clopidogrel and is not felt to be a good candidate for warfarin or a novel anticoagulant.  Will check Liver function tests, thyroid function tests, CBC due to complaints of weakness  Pacemaker - dual chamber Medtronic 2009  Pacemaker check in clinic. Normal device function. Thresholds, sensing, impedances consistent with previous measurements. Estimated longevity 12 months. Patient will follow up via [facility] every month. her heart rate histogram is favorable and appropriate for her level of activity. Her symptoms of weakness cannot be attributed to her rhythm problems.  AAA  (abdominal aortic aneurysm)  Asymptomatic, stable in size at about 3.8 cm -  Since the aneurysm is small and I doubt she will ever be a surgical or percutaneous repair candidate, routine monitoring does not appear indicated anymore Bilateral carotid artery stenosis  Mild. Asymptomatic.      Current medicines are reviewed at length with the patient today.  The patient does not have concerns regarding medicines.  The following changes have been made:  no change  Labs/ tests ordered today include:  Orders Placed This Encounter  Procedures  . Comprehensive Metabolic Panel (CMET)  . TSH  . CBC  . EKG 12-Lead    Patient Instructions  Remote monitoring is used to monitor your Pacemaker of ICD from home. This monitoring reduces the number of office visits required to check your device to one time per year. It allows [place] to keep an eye on the functioning of your device to ensure it is working properly. You are scheduled for a device check from home on 04-23-15. You may send your transmission at any time that day. If you have a wireless device, the transmission will be sent automatically. After your physician reviews your transmission, you will receive a postcard with your next transmission date.   Your physician wants you to follow-up in: [place] will receive a reminder letter in the mail two months in advance. If you don't receive a letter, please call our office to schedule the follow-up appointment.   Your physician recommends that you TODAY     Signed, Sanda Klein, MD  01/24/2015 6:02 PM    Sanda Klein, MD, [facility] [phone] office [phone] pager

## 2015-01-26 ENCOUNTER — Emergency Department (HOSPITAL_BASED_OUTPATIENT_CLINIC_OR_DEPARTMENT_OTHER): Payer: Medicare Other

## 2015-01-26 ENCOUNTER — Other Ambulatory Visit: Payer: Self-pay | Admitting: Cardiovascular Disease

## 2015-01-26 ENCOUNTER — Inpatient Hospital Stay (HOSPITAL_BASED_OUTPATIENT_CLINIC_OR_DEPARTMENT_OTHER)
Admission: EM | Admit: 2015-01-26 | Discharge: 2015-02-01 | DRG: 871 | Disposition: A | Discharge status: Skilled Nursing Facility | Payer: Medicare Other | Attending: Internal Medicine | Admitting: Internal Medicine

## 2015-01-26 ENCOUNTER — Encounter (HOSPITAL_BASED_OUTPATIENT_CLINIC_OR_DEPARTMENT_OTHER): Payer: Self-pay

## 2015-01-26 DIAGNOSIS — K5900 Constipation, unspecified: Secondary | ICD-10-CM | POA: Diagnosis present

## 2015-01-26 DIAGNOSIS — E039 Hypothyroidism, unspecified: Secondary | ICD-10-CM | POA: Diagnosis present

## 2015-01-26 DIAGNOSIS — Z87891 Personal history of nicotine dependence: Secondary | ICD-10-CM | POA: Diagnosis not present

## 2015-01-26 DIAGNOSIS — R609 Edema, unspecified: Secondary | ICD-10-CM | POA: Diagnosis not present

## 2015-01-26 DIAGNOSIS — R1110 Vomiting, unspecified: Secondary | ICD-10-CM | POA: Diagnosis not present

## 2015-01-26 DIAGNOSIS — Z833 Family history of diabetes mellitus: Secondary | ICD-10-CM

## 2015-01-26 DIAGNOSIS — I2510 Atherosclerotic heart disease of native coronary artery without angina pectoris: Secondary | ICD-10-CM | POA: Diagnosis present

## 2015-01-26 DIAGNOSIS — Z515 Encounter for palliative care: Secondary | ICD-10-CM | POA: Diagnosis not present

## 2015-01-26 DIAGNOSIS — J9601 Acute respiratory failure with hypoxia: Secondary | ICD-10-CM | POA: Clinically undetermined

## 2015-01-26 DIAGNOSIS — I714 Abdominal aortic aneurysm, without rupture: Secondary | ICD-10-CM | POA: Diagnosis present

## 2015-01-26 DIAGNOSIS — A419 Sepsis, unspecified organism: Principal | ICD-10-CM | POA: Diagnosis present

## 2015-01-26 DIAGNOSIS — N183 Chronic kidney disease, stage 3 (moderate): Secondary | ICD-10-CM | POA: Diagnosis present

## 2015-01-26 DIAGNOSIS — Z8673 Personal history of transient ischemic attack (TIA), and cerebral infarction without residual deficits: Secondary | ICD-10-CM | POA: Diagnosis not present

## 2015-01-26 DIAGNOSIS — D696 Thrombocytopenia, unspecified: Secondary | ICD-10-CM | POA: Diagnosis present

## 2015-01-26 DIAGNOSIS — M25551 Pain in right hip: Secondary | ICD-10-CM | POA: Diagnosis not present

## 2015-01-26 DIAGNOSIS — I951 Orthostatic hypotension: Secondary | ICD-10-CM

## 2015-01-26 DIAGNOSIS — Z950 Presence of cardiac pacemaker: Secondary | ICD-10-CM | POA: Diagnosis not present

## 2015-01-26 DIAGNOSIS — S9032XA Contusion of left foot, initial encounter: Secondary | ICD-10-CM | POA: Diagnosis not present

## 2015-01-26 DIAGNOSIS — E872 Acidosis, unspecified: Secondary | ICD-10-CM

## 2015-01-26 DIAGNOSIS — Z955 Presence of coronary angioplasty implant and graft: Secondary | ICD-10-CM | POA: Diagnosis not present

## 2015-01-26 DIAGNOSIS — Z8249 Family history of ischemic heart disease and other diseases of the circulatory system: Secondary | ICD-10-CM | POA: Diagnosis not present

## 2015-01-26 DIAGNOSIS — D72829 Elevated white blood cell count, unspecified: Secondary | ICD-10-CM | POA: Diagnosis not present

## 2015-01-26 DIAGNOSIS — R259 Unspecified abnormal involuntary movements: Secondary | ICD-10-CM | POA: Diagnosis not present

## 2015-01-26 DIAGNOSIS — R630 Anorexia: Secondary | ICD-10-CM | POA: Diagnosis present

## 2015-01-26 DIAGNOSIS — I110 Hypertensive heart disease with heart failure: Secondary | ICD-10-CM | POA: Diagnosis present

## 2015-01-26 DIAGNOSIS — I130 Hypertensive heart and chronic kidney disease with heart failure and stage 1 through stage 4 chronic kidney disease, or unspecified chronic kidney disease: Secondary | ICD-10-CM | POA: Diagnosis present

## 2015-01-26 DIAGNOSIS — K219 Gastro-esophageal reflux disease without esophagitis: Secondary | ICD-10-CM | POA: Diagnosis present

## 2015-01-26 DIAGNOSIS — M7989 Other specified soft tissue disorders: Secondary | ICD-10-CM | POA: Diagnosis not present

## 2015-01-26 DIAGNOSIS — N390 Urinary tract infection, site not specified: Secondary | ICD-10-CM | POA: Diagnosis present

## 2015-01-26 DIAGNOSIS — Z825 Family history of asthma and other chronic lower respiratory diseases: Secondary | ICD-10-CM

## 2015-01-26 DIAGNOSIS — B961 Klebsiella pneumoniae [K. pneumoniae] as the cause of diseases classified elsewhere: Secondary | ICD-10-CM | POA: Diagnosis present

## 2015-01-26 DIAGNOSIS — I959 Hypotension, unspecified: Secondary | ICD-10-CM | POA: Diagnosis present

## 2015-01-26 DIAGNOSIS — B9689 Other specified bacterial agents as the cause of diseases classified elsewhere: Secondary | ICD-10-CM | POA: Diagnosis present

## 2015-01-26 DIAGNOSIS — J90 Pleural effusion, not elsewhere classified: Secondary | ICD-10-CM | POA: Diagnosis not present

## 2015-01-26 DIAGNOSIS — J810 Acute pulmonary edema: Secondary | ICD-10-CM | POA: Clinically undetermined

## 2015-01-26 DIAGNOSIS — L03116 Cellulitis of left lower limb: Secondary | ICD-10-CM | POA: Diagnosis not present

## 2015-01-26 DIAGNOSIS — R0989 Other specified symptoms and signs involving the circulatory and respiratory systems: Secondary | ICD-10-CM

## 2015-01-26 DIAGNOSIS — Z8679 Personal history of other diseases of the circulatory system: Secondary | ICD-10-CM | POA: Diagnosis not present

## 2015-01-26 DIAGNOSIS — Z809 Family history of malignant neoplasm, unspecified: Secondary | ICD-10-CM | POA: Diagnosis not present

## 2015-01-26 DIAGNOSIS — J449 Chronic obstructive pulmonary disease, unspecified: Secondary | ICD-10-CM | POA: Diagnosis present

## 2015-01-26 DIAGNOSIS — M6281 Muscle weakness (generalized): Secondary | ICD-10-CM | POA: Diagnosis not present

## 2015-01-26 DIAGNOSIS — I5031 Acute diastolic (congestive) heart failure: Secondary | ICD-10-CM | POA: Diagnosis not present

## 2015-01-26 DIAGNOSIS — Z96643 Presence of artificial hip joint, bilateral: Secondary | ICD-10-CM | POA: Diagnosis present

## 2015-01-26 DIAGNOSIS — I10 Essential (primary) hypertension: Secondary | ICD-10-CM | POA: Diagnosis not present

## 2015-01-26 DIAGNOSIS — M810 Age-related osteoporosis without current pathological fracture: Secondary | ICD-10-CM | POA: Diagnosis present

## 2015-01-26 DIAGNOSIS — R1312 Dysphagia, oropharyngeal phase: Secondary | ICD-10-CM | POA: Diagnosis not present

## 2015-01-26 DIAGNOSIS — Z823 Family history of stroke: Secondary | ICD-10-CM | POA: Diagnosis not present

## 2015-01-26 DIAGNOSIS — R1314 Dysphagia, pharyngoesophageal phase: Secondary | ICD-10-CM | POA: Diagnosis not present

## 2015-01-26 DIAGNOSIS — R0602 Shortness of breath: Secondary | ICD-10-CM | POA: Insufficient documentation

## 2015-01-26 DIAGNOSIS — W19XXXA Unspecified fall, initial encounter: Secondary | ICD-10-CM

## 2015-01-26 DIAGNOSIS — R52 Pain, unspecified: Secondary | ICD-10-CM

## 2015-01-26 DIAGNOSIS — E876 Hypokalemia: Secondary | ICD-10-CM | POA: Diagnosis not present

## 2015-01-26 DIAGNOSIS — Z7902 Long term (current) use of antithrombotics/antiplatelets: Secondary | ICD-10-CM | POA: Diagnosis not present

## 2015-01-26 DIAGNOSIS — J811 Chronic pulmonary edema: Secondary | ICD-10-CM

## 2015-01-26 DIAGNOSIS — I480 Paroxysmal atrial fibrillation: Secondary | ICD-10-CM | POA: Diagnosis present

## 2015-01-26 DIAGNOSIS — Z7189 Other specified counseling: Secondary | ICD-10-CM | POA: Diagnosis not present

## 2015-01-26 DIAGNOSIS — I252 Old myocardial infarction: Secondary | ICD-10-CM

## 2015-01-26 DIAGNOSIS — R0600 Dyspnea, unspecified: Secondary | ICD-10-CM

## 2015-01-26 DIAGNOSIS — R2681 Unsteadiness on feet: Secondary | ICD-10-CM | POA: Diagnosis not present

## 2015-01-26 DIAGNOSIS — Z66 Do not resuscitate: Secondary | ICD-10-CM | POA: Diagnosis present

## 2015-01-26 DIAGNOSIS — I4819 Other persistent atrial fibrillation: Secondary | ICD-10-CM | POA: Diagnosis present

## 2015-01-26 DIAGNOSIS — Z9181 History of falling: Secondary | ICD-10-CM | POA: Diagnosis not present

## 2015-01-26 DIAGNOSIS — M25571 Pain in right ankle and joints of right foot: Secondary | ICD-10-CM | POA: Diagnosis not present

## 2015-01-26 DIAGNOSIS — M79662 Pain in left lower leg: Secondary | ICD-10-CM | POA: Diagnosis not present

## 2015-01-26 LAB — CK: CK TOTAL: 287 U/L — AB (ref 38–234)

## 2015-01-26 LAB — CBC WITH DIFFERENTIAL/PLATELET
BASOS ABS: 0 10*3/uL (ref 0.0–0.1)
BASOS PCT: 0 %
Band Neutrophils: 8 %
EOS ABS: 0 10*3/uL (ref 0.0–0.7)
Eosinophils Relative: 0 %
HEMATOCRIT: 36.9 % (ref 36.0–46.0)
HEMOGLOBIN: 11.6 g/dL — AB (ref 12.0–15.0)
LYMPHS ABS: 0.6 10*3/uL — AB (ref 0.7–4.0)
LYMPHS PCT: 2 %
MCH: 31.1 pg (ref 26.0–34.0)
MCHC: 31.4 g/dL (ref 30.0–36.0)
MCV: 98.9 fL (ref 78.0–100.0)
MONO ABS: 1.2 10*3/uL — AB (ref 0.1–1.0)
Monocytes Relative: 4 %
NEUTROS PCT: 86 %
Neutro Abs: 28.6 10*3/uL — ABNORMAL HIGH (ref 1.7–7.7)
Platelets: 157 10*3/uL (ref 150–400)
RBC: 3.73 MIL/uL — ABNORMAL LOW (ref 3.87–5.11)
RDW: 14.8 % (ref 11.5–15.5)
WBC: 30.4 10*3/uL — AB (ref 4.0–10.5)

## 2015-01-26 LAB — I-STAT CG4 LACTIC ACID, ED
LACTIC ACID, VENOUS: 5.05 mmol/L — AB (ref 0.5–2.0)
LACTIC ACID, VENOUS: 3.16 mmol/L — AB (ref 0.5–2.0)

## 2015-01-26 LAB — CREATININE, SERUM
Creatinine, Ser: 1.55 mg/dL — ABNORMAL HIGH (ref 0.44–1.00)
GFR calc Af Amer: 32 mL/min — ABNORMAL LOW (ref >60)
GFR, EST NON AFRICAN AMERICAN: 27 mL/min — AB (ref >60)

## 2015-01-26 LAB — CBC
HEMATOCRIT: 30.4 % — AB (ref 36.0–46.0)
HEMOGLOBIN: 9.5 g/dL — AB (ref 12.0–15.0)
MCH: 30.6 pg (ref 26.0–34.0)
MCHC: 31.3 g/dL (ref 30.0–36.0)
MCV: 98.1 fL (ref 78.0–100.0)
Platelets: 112 10*3/uL — ABNORMAL LOW (ref 150–400)
RBC: 3.1 MIL/uL — ABNORMAL LOW (ref 3.87–5.11)
RDW: 14.5 % (ref 11.5–15.5)
WBC: 18.4 10*3/uL — ABNORMAL HIGH (ref 4.0–10.5)

## 2015-01-26 LAB — URINALYSIS, ROUTINE W REFLEX MICROSCOPIC
Glucose, UA: NEGATIVE mg/dL
KETONES UR: NEGATIVE mg/dL
NITRITE: NEGATIVE
PROTEIN: 30 mg/dL — AB
Specific Gravity, Urine: 1.018 (ref 1.005–1.030)
UROBILINOGEN UA: 0.2 mg/dL (ref 0.0–1.0)
pH: 5 (ref 5.0–8.0)

## 2015-01-26 LAB — COMPREHENSIVE METABOLIC PANEL
ALBUMIN: 2.9 g/dL — AB (ref 3.5–5.0)
ALK PHOS: 72 U/L (ref 38–126)
ALT: 97 U/L — ABNORMAL HIGH (ref 14–54)
ANION GAP: 13 (ref 5–15)
AST: 218 U/L — ABNORMAL HIGH (ref 15–41)
BILIRUBIN TOTAL: 1.1 mg/dL (ref 0.3–1.2)
BUN: 34 mg/dL — ABNORMAL HIGH (ref 6–20)
CALCIUM: 8.6 mg/dL — AB (ref 8.9–10.3)
CO2: 26 mmol/L (ref 22–32)
Chloride: 97 mmol/L — ABNORMAL LOW (ref 101–111)
Creatinine, Ser: 1.8 mg/dL — ABNORMAL HIGH (ref 0.44–1.00)
GFR calc non Af Amer: 23 mL/min — ABNORMAL LOW (ref >60)
GFR, EST AFRICAN AMERICAN: 26 mL/min — AB (ref >60)
Glucose, Bld: 121 mg/dL — ABNORMAL HIGH (ref 65–99)
POTASSIUM: 4 mmol/L (ref 3.5–5.1)
SODIUM: 136 mmol/L (ref 135–145)
TOTAL PROTEIN: 6.1 g/dL — AB (ref 6.5–8.1)

## 2015-01-26 LAB — URINE MICROSCOPIC-ADD ON

## 2015-01-26 LAB — LACTIC ACID, PLASMA
LACTIC ACID, VENOUS: 1.9 mmol/L (ref 0.5–2.0)
Lactic Acid, Venous: 2.4 mmol/L (ref 0.5–2.0)

## 2015-01-26 LAB — PROTIME-INR
INR: 1.42 (ref 0.00–1.49)
Prothrombin Time: 17.5 seconds — ABNORMAL HIGH (ref 11.6–15.2)

## 2015-01-26 LAB — TROPONIN I: TROPONIN I: 0.29 ng/mL — AB (ref <0.031)

## 2015-01-26 LAB — APTT: APTT: 39 s — AB (ref 24–37)

## 2015-01-26 LAB — MRSA PCR SCREENING: MRSA by PCR: NEGATIVE

## 2015-01-26 LAB — LIPASE, BLOOD: Lipase: 26 U/L (ref 11–51)

## 2015-01-26 MED ORDER — SODIUM CHLORIDE 0.9 % IV BOLUS (SEPSIS)
1000.0000 mL | Freq: Once | INTRAVENOUS | Status: AC
  Administered 2015-01-26: 1000 mL via INTRAVENOUS

## 2015-01-26 MED ORDER — ACETAMINOPHEN 325 MG PO TABS
650.0000 mg | ORAL_TABLET | Freq: Four times a day (QID) | ORAL | Status: DC | PRN
  Administered 2015-01-27 – 2015-02-01 (×8): 650 mg via ORAL
  Filled 2015-01-26 (×8): qty 2

## 2015-01-26 MED ORDER — HYDROCORTISONE NA SUCCINATE PF 100 MG IJ SOLR
200.0000 mg | Freq: Once | INTRAMUSCULAR | Status: DC
  Filled 2015-01-26: qty 4

## 2015-01-26 MED ORDER — HEPARIN SODIUM (PORCINE) 5000 UNIT/ML IJ SOLN
5000.0000 [IU] | Freq: Three times a day (TID) | INTRAMUSCULAR | Status: DC
  Administered 2015-01-26 – 2015-02-01 (×18): 5000 [IU] via SUBCUTANEOUS
  Filled 2015-01-26 (×13): qty 1

## 2015-01-26 MED ORDER — SODIUM CHLORIDE 0.9 % IV SOLN
Freq: Once | INTRAVENOUS | Status: AC
  Administered 2015-01-26: 15:00:00 via INTRAVENOUS

## 2015-01-26 MED ORDER — PIPERACILLIN-TAZOBACTAM 3.375 G IVPB 30 MIN
3.3750 g | Freq: Once | INTRAVENOUS | Status: AC
  Administered 2015-01-26: 3.375 g via INTRAVENOUS
  Filled 2015-01-26 (×2): qty 50

## 2015-01-26 MED ORDER — VANCOMYCIN HCL 500 MG IV SOLR
500.0000 mg | INTRAVENOUS | Status: DC
  Administered 2015-01-28 – 2015-01-30 (×2): 500 mg via INTRAVENOUS
  Filled 2015-01-26 (×2): qty 500

## 2015-01-26 MED ORDER — ALUM & MAG HYDROXIDE-SIMETH 200-200-20 MG/5ML PO SUSP
30.0000 mL | Freq: Four times a day (QID) | ORAL | Status: DC | PRN
  Administered 2015-01-30: 30 mL via ORAL
  Filled 2015-01-26: qty 30

## 2015-01-26 MED ORDER — BOOST PLUS PO LIQD
1.0000 | ORAL | Status: DC
  Administered 2015-01-29 – 2015-01-30 (×2): 237 mL via ORAL
  Filled 2015-01-26 (×7): qty 237

## 2015-01-26 MED ORDER — SODIUM CHLORIDE 0.9 % IV SOLN
INTRAVENOUS | Status: DC
  Administered 2015-01-26 – 2015-01-27 (×2): via INTRAVENOUS

## 2015-01-26 MED ORDER — AMIODARONE HCL 200 MG PO TABS
200.0000 mg | ORAL_TABLET | Freq: Every day | ORAL | Status: DC
  Administered 2015-01-26 – 2015-02-01 (×7): 200 mg via ORAL
  Filled 2015-01-26 (×7): qty 1

## 2015-01-26 MED ORDER — SODIUM CHLORIDE 0.9 % IV BOLUS (SEPSIS)
500.0000 mL | Freq: Once | INTRAVENOUS | Status: AC
  Administered 2015-01-26: 500 mL via INTRAVENOUS

## 2015-01-26 MED ORDER — LEVOTHYROXINE SODIUM 125 MCG PO TABS
125.0000 ug | ORAL_TABLET | Freq: Every day | ORAL | Status: DC
  Administered 2015-01-26 – 2015-02-01 (×7): 125 ug via ORAL
  Filled 2015-01-26 (×10): qty 1

## 2015-01-26 MED ORDER — CLOPIDOGREL BISULFATE 75 MG PO TABS
75.0000 mg | ORAL_TABLET | Freq: Every day | ORAL | Status: DC
  Administered 2015-01-26 – 2015-02-01 (×7): 75 mg via ORAL
  Filled 2015-01-26 (×7): qty 1

## 2015-01-26 MED ORDER — VANCOMYCIN HCL IN DEXTROSE 1-5 GM/200ML-% IV SOLN
1000.0000 mg | Freq: Once | INTRAVENOUS | Status: AC
  Administered 2015-01-26: 1000 mg via INTRAVENOUS
  Filled 2015-01-26: qty 200

## 2015-01-26 MED ORDER — PIPERACILLIN-TAZOBACTAM IN DEX 2-0.25 GM/50ML IV SOLN
2.2500 g | Freq: Three times a day (TID) | INTRAVENOUS | Status: DC
  Administered 2015-01-26 – 2015-01-31 (×14): 2.25 g via INTRAVENOUS
  Filled 2015-01-26 (×23): qty 50

## 2015-01-26 MED ORDER — ONDANSETRON HCL 4 MG PO TABS: 4.0000 mg | ORAL_TABLET | Freq: Four times a day (QID) | ORAL | Status: DC | PRN

## 2015-01-26 MED ORDER — ONDANSETRON HCL 4 MG/2ML IJ SOLN
4.0000 mg | Freq: Four times a day (QID) | INTRAMUSCULAR | Status: DC | PRN
  Administered 2015-01-28 – 2015-01-29 (×3): 4 mg via INTRAVENOUS
  Filled 2015-01-26 (×3): qty 2

## 2015-01-26 MED ORDER — ACETAMINOPHEN 650 MG RE SUPP: 650.0000 mg | Freq: Four times a day (QID) | RECTAL | Status: DC | PRN

## 2015-01-26 MED ORDER — PANTOPRAZOLE SODIUM 40 MG PO TBEC
40.0000 mg | DELAYED_RELEASE_TABLET | Freq: Every day | ORAL | Status: DC
  Administered 2015-01-26 – 2015-02-01 (×7): 40 mg via ORAL
  Filled 2015-01-26 (×7): qty 1

## 2015-01-26 NOTE — ED Notes (Signed)
Patient arrived from home where she lives at home. She got up during the night at 0200 for nausea and 1 episode of vomiting. Denies abdominal pain, no diarrhea. On arrival patient tachypneic and hypotensive. Pale on arrival, skin dry. Alert and oriented, patient has bruising and swelling to left ankle, pain to same with any movement. No complaint of knee pain from fall, no loc

## 2015-01-26 NOTE — H&P (Addendum)
[facility] History and Physical  [name] [id number] DOB: [birth year] DOA: 01/26/2015   PCP: Lauree Chandler, NP    Presenting complaint: fall  HPI: [name] is a 81 y.o. female with PMH of AAA, HTN, hypothyroidism, CAD, CHF who lives alone. She fell in the middle of the night last night and although she pressed her life alert button many times, no one came to help her. Her daughter who lives next door went to check on her and found her on the floor this AM. The patient states she vomited this AM. No abdominal pain or diarrhea. She had chills this AM but no noted fever. CT of the abdomen in the ER revealed an enlarging AAA with possible extravasation of blood. Her SBP was in the 80s on arrival to the ER and she was given 2 L of IVF which has helped it to improve to low 100s. On exam she is found to have cellulitis of her left leg. She was recently admitted for the same.  Further history obtained from daughters. The patient is severely deconditioned and has lost a great deal of weight due to lack of appetite. She spend most of her day in bed, sleeping. She has tried an appetite stimulant in the past which was ineffective. They have requested a palliative care consults.   ROS  General: The patient denies +  anorexia, + weight loss, chills Cardiac: Denies chest pain, syncope, palpitations, pedal edema  Respiratory: Denies cough, shortness of breath, wheezing GI: Denies severe indigestion/heartburn, abdominal pain,  diarrhea and constipation + nausea, vomiting GU: Denies hematuria, incontinence, dysuria  Musculoskeletal: Denies arthritis  Skin: Denies suspicious skin lesions Neurologic: Denies focal weakness or numbness, change in vision Psychiatry: Denies depression or anxiety. Hematologic: no easy bruising or bleeding  All other systems reviewed and found to be negative.  Past Medical History  Diagnosis Date  . GI bleed 2007  . Hypertension   . TIA (transient  ischemic attack)   . Hypothyroidism   . Abdominal aortic aneurysm ([place])   . CAD (coronary artery disease)   . SSS (sick sinus syndrome) ([place]) 10/05/2007    Medtronic Adapta  . Myocardial infarction ([place])   . Atrial fibrillation ([place])   . COPD (chronic obstructive pulmonary disease) ([place])   . Asthma     hx  . Anemia     hx  . Blood transfusion   . UTI (lower urinary tract infection)     hx  . Renal failure, chronic   . H/O hiatal hernia   . GERD (gastroesophageal reflux disease)   . Stroke ([place])   . Osteoporosis   . Blood transfusion without reported diagnosis   . Pacemaker     Past Surgical History  Procedure Laterality Date  . Pacemaker insertion  10/05/2007    Medtronic Adapta  . Coronary stent placement      x9  . Cardiac catheterization    . Fracture surgery      femur fx, /w ORIF into HIP- 2008  . Total hip arthroplasty      planned for 08/27/2011 Left  . Total hip arthroplasty  08/27/2011    Procedure: TOTAL HIP ARTHROPLASTY;  Surgeon: Kerin Salen, MD;  Location: [facility];  Service: Orthopedics;  Laterality: Right;  . US echocardiography  08/23/2011    EF 50%,LA mod. dilated,mild to mod. mitral annular ca+,mod. MR,mild to mod TR,mild to mod. PH,trace AI.  [name] Nm myocar perf wall motion  10/08/2010  mild apical anterior ischemia  . Colonoscopy      Social History: was a chain smoker who quit in her 62s, does not drink alcohol Lives at home alone and daughter lives next door    Allergies  Allergen Reactions  . Nubain [Nalbuphine Hcl] Anaphylaxis  . Statins     Leg weakness  . Iodine Rash    Family history:   Family History  Problem Relation Age of Onset  . Emphysema Father   . Heart disease Mother   . Cancer Brother   . Cancer Brother   . Cancer Sister   . Cancer Sister   . Diabetes Daughter   . Atrial fibrillation Daughter   . Atrial fibrillation Daughter   . Stroke Daughter   . Heart Problems Son       Prior to Admission medications    Medication Sig Start Date End Date Taking? Authorizing Provider  amiodarone (PACERONE) 200 MG tablet TAKE 1 TABLET BY MOUTH DAILY. 08/05/14   Mihai Croitoru, MD  Biotin 1000 MCG tablet Take 1,000 mcg by mouth daily.    Historical Provider, MD  Calcium Carb-Cholecalciferol (CALCIUM 600 + D PO) Take 1 tablet by mouth daily.    Historical Provider, MD  clopidogrel (PLAVIX) 75 MG tablet Take 1 tablet (75 mg total) by mouth daily. 07/01/14   Mihai Croitoru, MD  feeding supplement (BOOST HIGH PROTEIN) LIQD Take 1 Container by mouth daily.    Historical Provider, MD  ferrous sulfate 325 (65 FE) MG tablet Take 325 mg by mouth daily with breakfast.     Historical Provider, MD  furosemide (LASIX) 20 MG tablet Take 1 tablet (20 mg total) by mouth daily. 11/07/14   Domenic Polite, MD  levothyroxine (SYNTHROID, LEVOTHROID) 125 MCG tablet TAKE 1 TABLET BY MOUTH EVERY DAY 10/30/13   Lorretta Harp, MD  nitroGLYCERIN (NITROSTAT) 0.4 MG SL tablet Place 1 tablet (0.4 mg total) under the tongue every 5 (five) minutes as needed for chest pain (For a total of 3 tablets, if chest pain persist to call 911). 10/15/14   Lauree Chandler, NP  pantoprazole (PROTONIX) 40 MG tablet TAKE 1 TABLET (40 MG TOTAL) BY MOUTH DAILY. 11/11/14   Lauree Chandler, NP  potassium chloride SA (K-DUR,KLOR-CON) 20 MEQ tablet Take 20 mEq by mouth daily.    Historical Provider, MD  ranitidine (ZANTAC) 300 MG tablet Take 300 mg by mouth at bedtime.     Historical Provider, MD  vitamin B-12 (CYANOCOBALAMIN) 1000 MCG tablet Take 1,000 mcg by mouth daily.    Historical Provider, MD     Physical Exam: Filed Vitals:   01/26/15 1330 01/26/15 1400 01/26/15 1430 01/26/15 1431  BP: 95/43 91/48 59/44  97/42  Pulse: 68 70 71 70  Temp:      TempSrc:      Resp:  18 26 19   Weight:      SpO2: 97% 96% 98% 97%     General: elderly female laying in bed in no acute distress HEENT: Normocephalic and Atraumatic, Mucous membranes pink                 PERRLA; EOM intact; No scleral icterus,                 Nares: Patent, Oropharynx: Clear, Fair Dentition                 Neck: FROM, no cervical lymphadenopathy, thyromegaly, carotid bruit or JVD;  Breasts: deferred CHEST WALL: No  tenderness  CHEST: Normal respiration, clear to auscultation bilaterally  HEART: Regular rate and rhythm; no murmurs rubs or gallops 2/6 murmur at apex BACK: No kyphosis or scoliosis; no CVA tenderness  GI: Positive Bowel Sounds, soft, non-tender; no masses, no organomegaly Rectal Exam: deferred MSK: No cyanosis, clubbing, or edema Genitalia: not examined  SKIN:  No ulceration - severe erythema of left foot and leg, separate area of milder erythema on left upper thigh and groin both areas are tender and warm to the touch.  CNS: Alert and Oriented x 4, Nonfocal exam, CN 2-12   ROS   Labs on Admission:  Basic Metabolic Panel:  Recent Labs Lab 01/22/15 1203 01/26/15 1030  NA 141 136  K 5.0 4.0  CL 99 97*  CO2 32* 26  GLUCOSE 85 121*  BUN 18 34*  CREATININE 1.16* 1.80*  CALCIUM 8.4* 8.6*   Liver Function Tests:  Recent Labs Lab 01/22/15 1203 01/26/15 1030  AST 50* 218*  ALT 31* 97*  ALKPHOS 70 72  BILITOT 0.6 1.1  PROT 5.7* 6.1*  ALBUMIN 3.0* 2.9*    Recent Labs Lab 01/26/15 1030  LIPASE 26   No results for input(s): AMMONIA in the last 168 hours. CBC:  Recent Labs Lab 01/22/15 1203 01/26/15 1030  WBC 6.3 30.4*  NEUTROABS  --  28.6*  HGB 11.8* 11.6*  HCT 36.5 36.9  MCV 96.8 98.9  PLT 165 157   Cardiac Enzymes:  Recent Labs Lab 01/26/15 1030  CKTOTAL 287*  TROPONINI 0.29*    BNP (last 3 results)  Recent Labs  11/03/14 1807  BNP 377.4*    ProBNP (last 3 results) No results for input(s): PROBNP in the last 8760 hours.  CBG: No results for input(s): GLUCAP in the last 168 hours.  Radiological Exams on Admission: Ct Abdomen Pelvis Wo Contrast  01/26/2015  CLINICAL DATA:  Vomiting at 2 a.m. last night.  Tachypnea and hypotension. EXAM: CT ABDOMEN AND PELVIS WITHOUT CONTRAST TECHNIQUE: Multidetector CT imaging of the abdomen and pelvis was performed following the standard protocol without IV contrast. COMPARISON:  CT abdomen and pelvis 07/02/2006. FINDINGS: There small bilateral pleural effusions. No pericardial effusion. Cardiomegaly is noted. Pacing device is in place. The lungs demonstrate basilar atelectasis. Calcific coronary artery disease is seen. Densely calcified splenic artery aneurysm measuring 1.1 cm is unchanged. There is extensive aortoiliac atherosclerosis. Abdominal aortic aneurysm measuring 4.2 cm AP by 4.2 cm transverse by 4.5 cm craniocaudal is identified which had measured 2.8 cm in diameter on the prior exam. Mild haziness is seen about the aneurysm although there is some although there is some slight motion on the examination. The liver, spleen, adrenal glands and pancreas are unremarkable. There is some renal atrophy, more notable on the right, which is unchanged in appearance. A single tiny gallstone is noted there is no evidence of cholecystitis. Streak artifact in the pelvis from a right hip replacement somewhat limits visualization. There is diverticular disease appearing worst in the sigmoid without evidence of diverticulitis. The stomach, small bowel and appendix appear normal. No lytic or sclerotic bony lesion is identified. Left hip osteoarthritis is seen. Right hip replacement is in place. IMPRESSION: Interval enlargement of an infrarenal abdominal aortic aneurysm with mild haziness about the aneurysm which could be secondary to hemorrhage. CT angiogram of the abdomen and pelvis is recommended for further evaluation. Small bilateral pleural effusions. Tiny gallstone without evidence cholecystitis. Diverticulosis without diverticulitis. Critical Value/emergent results were called by telephone at the time  of interpretation on 01/26/2015 at 11:10 am to Dr. Lonia Skinner , who verbally  acknowledged these results. Electronically Signed   By: Inge Rise M.D.   On: 01/26/2015 11:10   Dg Chest 2 View  01/26/2015  CLINICAL DATA:  Acute nausea and vomiting, shortness of breath, COPD, history of CHF EXAM: CHEST  2 VIEW COMPARISON:  11/03/2014 FINDINGS: Increased thoracic kyphosis evident. Heart is enlarged without superimposed CHF or edema. No focal pneumonia, collapse or consolidation. Background COPD/ emphysema suspected. Left subclavian 2 lead pacer noted. Aorta is atherosclerotic. Bones are osteopenic. IMPRESSION: Cardiomegaly without superimposed CHF or pneumonia Background COPD/ emphysema Atherosclerosis Electronically Signed   By: Jerilynn Mages.  Shick M.D.   On: 01/26/2015 11:37   Dg Tibia/fibula Left  01/26/2015  CLINICAL DATA:  Fall, left lower extremity pain and swelling EXAM: LEFT TIBIA AND FIBULA - 2 VIEW COMPARISON:  01/26/2015 FINDINGS: Diffuse osteopenia. Atrophy of the soft tissues. No acute osseous finding or displaced fracture. Left tibia and fibula appear intact. No definite soft tissue abnormality. Peripheral atherosclerosis noted. IMPRESSION: Osteopenia.  No acute osseous finding Peripheral atherosclerosis Electronically Signed   By: Jerilynn Mages.  Shick M.D.   On: 01/26/2015 11:42   Dg Ankle Complete Left  01/26/2015  CLINICAL DATA:  Nausea at 2 a.m. last night. The patient went to the ground and was unable to get up. She was found down this morning. Left ankle pain and swelling. Initial encounter. EXAM: LEFT ANKLE COMPLETE - 3+ VIEW COMPARISON:  None. FINDINGS: No acute bony or joint abnormality is identified. Bones appear osteopenic. Mild soft tissue swelling about the ankle is noted. IMPRESSION: Mild soft tissue swelling without underlying acute bony or joint abnormality. Osteopenia. Electronically Signed   By: Inge Rise M.D.   On: 01/26/2015 11:43   Dg Foot Complete Left  01/26/2015  CLINICAL DATA:  Left lower extremity and foot bruising and swelling. Fall. EXAM: LEFT  FOOT - COMPLETE 3+ VIEW COMPARISON:  01/26/2015 FINDINGS: Bones are osteopenic. No acute osseous finding or malalignment. No definite soft tissue abnormality. Degenerative changes of the midfoot involving the navicular bone with sclerosis and joint space loss. IMPRESSION: Osteopenia and midfoot degenerative changes. No acute osseous finding. Electronically Signed   By: Jerilynn Mages.  Shick M.D.   On: 01/26/2015 11:39    EKG: Independently reviewed. A-fib, LAD  Assessment/Plan Principal Problem:   Cellulitis of leg, left/ sepsis - lactic acidosis, leukocytosis- lactic acid level improving - 2 areas of cellulitis on left leg - has been given Vanc and Zosyn in the ER- will continue- f/u cultures   Active Problems:   Hypotension - related to above sepsis- has improved with 2 L IVF- follow for CHF- hold Lasix for now - Metoprolol was recently discontinued due to hypotension     Thrombocytopenia  - acute on chronic possibly secondary to acute infection- follow  AAA - possibly rupture based on imaging?- no abdominal pain at this time- BP improved as well - DNR- will consult hospice per family's request  Acute on CKD 3 - has received 2 L IVF as mentioned above- hold Lasix and follow  Mildly elevated CK and Troponin with h/o CAD - due to sepsis and laying on the floor- follow- cont Plavix    Paroxysmal atrial fibrillation  - cont Amiodarone- rate controlled- not a good candidate for anticoagulation and on Plavix    Pacemaker - dual chamber Medtronic 2009  Hypothyroid - cont synthroid   Fall - PT eval- likely will be a SNF  candidate if she is willing to do rehab   Consulted: Palliative care  Code Status: DNR- discussed with patient and daughters  Family Communication: daughters at bedside  DVT Prophylaxis:heparin  Time spent: 51 min  [place], MD [facility]  If 7PM-7AM, please contact night-coverage [email or web address] 01/26/2015, 4:34 PM

## 2015-01-26 NOTE — ED Provider Notes (Addendum)
CSN: [id number]     Arrival date & time 01/26/15  1001 History   First MD Initiated Contact with Patient 01/26/15 1003     Chief Complaint  Patient presents with  . Fall     (Consider location/radiation/quality/duration/timing/severity/associated sxs/prior Treatment) HPI Comments: 81 y.o. Female with history of HTN, CAD, SSS, COPD, atrial fibrillation presents following a fall.  The patient reportedly was awoken from sleep around 2 AM with nausea and vomited but then fell on her knees and laid on the ground because she could not get up.  The patient's life alert was not functioning and so she was not found until around 9:00 AM by her daughter.  The patient has had gradual weakness over the last few weeks.  No fever or chills.  No cough or shortness of breath.  Patient does have a known AAA that has been being followed outpatient.     Past Medical History  Diagnosis Date  . GI bleed 2007  . Hypertension   . TIA (transient ischemic attack)   . Hypothyroidism   . Abdominal aortic aneurysm ([place])   . CAD (coronary artery disease)   . SSS (sick sinus syndrome) ([place]) 10/05/2007    Medtronic Adapta  . Myocardial infarction ([place])   . Atrial fibrillation ([place])   . COPD (chronic obstructive pulmonary disease) ([place])   . Asthma     hx  . Anemia     hx  . Blood transfusion   . UTI (lower urinary tract infection)     hx  . Renal failure, chronic   . H/O hiatal hernia   . GERD (gastroesophageal reflux disease)   . Stroke ([place])   . Osteoporosis   . Blood transfusion without reported diagnosis   . Pacemaker    Past Surgical History  Procedure Laterality Date  . Pacemaker insertion  10/05/2007    Medtronic Adapta  . Coronary stent placement      x9  . Cardiac catheterization    . Fracture surgery      femur fx, /w ORIF into HIP- 2008  . Total hip arthroplasty      planned for 08/27/2011 Left  . Total hip arthroplasty  08/27/2011    Procedure: TOTAL HIP ARTHROPLASTY;  Surgeon: Kerin Salen, MD;  Location: [facility];  Service: Orthopedics;  Laterality: Right;  . US echocardiography  08/23/2011    EF 50%,LA mod. dilated,mild to mod. mitral annular ca+,mod. MR,mild to mod TR,mild to mod. PH,trace AI.  [name] Nm myocar perf wall motion  10/08/2010    mild apical anterior ischemia  . Colonoscopy     Family History  Problem Relation Age of Onset  . Emphysema Father   . Heart disease Mother   . Cancer Brother   . Cancer Brother   . Cancer Sister   . Cancer Sister   . Diabetes Daughter   . Atrial fibrillation Daughter   . Atrial fibrillation Daughter   . Stroke Daughter   . Heart Problems Son    Social History  Substance Use Topics  . Smoking status: Former Smoker    Types: Cigarettes  . Smokeless tobacco: Never Used     Comment: quit 40 yrs ago- was smoking 2 packs a day at the most  . Alcohol Use: No   OB History    No data available     Review of Systems  Constitutional: Positive for fatigue. Negative for fever, chills and appetite change.  HENT: Negative for congestion  and nosebleeds.   Respiratory: Negative for cough, chest tightness and shortness of breath.   Cardiovascular: Negative for chest pain and palpitations.  Gastrointestinal: Positive for vomiting and abdominal pain. Negative for nausea and diarrhea.  Genitourinary: Negative for dysuria, urgency and hematuria.  Musculoskeletal: Negative for myalgias and back pain.  Skin: Positive for color change (burising over left lower leg).  Neurological: Positive for weakness (generalized). Negative for dizziness, syncope, numbness and headaches.  Hematological: Does not bruise/bleed easily.      Allergies  Nubain; Statins; and Iodine  Home Medications   Prior to Admission medications   Medication Sig Start Date End Date Taking? Authorizing Provider  amiodarone (PACERONE) 200 MG tablet TAKE 1 TABLET BY MOUTH DAILY. 08/05/14   Mihai Croitoru, MD  Biotin 1000 MCG tablet Take 1,000 mcg by mouth daily.     Historical Provider, MD  Calcium Carb-Cholecalciferol (CALCIUM 600 + D PO) Take 1 tablet by mouth daily.    Historical Provider, MD  clopidogrel (PLAVIX) 75 MG tablet Take 1 tablet (75 mg total) by mouth daily. 07/01/14   Mihai Croitoru, MD  feeding supplement (BOOST HIGH PROTEIN) LIQD Take 1 Container by mouth daily.    Historical Provider, MD  ferrous sulfate 325 (65 FE) MG tablet Take 325 mg by mouth daily with breakfast.     Historical Provider, MD  furosemide (LASIX) 20 MG tablet Take 1 tablet (20 mg total) by mouth daily. 11/07/14   Domenic Polite, MD  levothyroxine (SYNTHROID, LEVOTHROID) 125 MCG tablet TAKE 1 TABLET BY MOUTH EVERY DAY 10/30/13   Lorretta Harp, MD  nitroGLYCERIN (NITROSTAT) 0.4 MG SL tablet Place 1 tablet (0.4 mg total) under the tongue every 5 (five) minutes as needed for chest pain (For a total of 3 tablets, if chest pain persist to call 911). 10/15/14   Lauree Chandler, NP  pantoprazole (PROTONIX) 40 MG tablet TAKE 1 TABLET (40 MG TOTAL) BY MOUTH DAILY. 11/11/14   Lauree Chandler, NP  potassium chloride SA (K-DUR,KLOR-CON) 20 MEQ tablet Take 20 mEq by mouth daily.    Historical Provider, MD  ranitidine (ZANTAC) 300 MG tablet Take 300 mg by mouth at bedtime.     Historical Provider, MD  vitamin B-12 (CYANOCOBALAMIN) 1000 MCG tablet Take 1,000 mcg by mouth daily.    Historical Provider, MD   BP 100/49 mmHg  Pulse 72  Temp(Src) 98 F (36.7 C) (Axillary)  Resp 33  Ht 5\' 2"  (1.575 m)  Wt 119 lb 11.4 oz (54.3 kg)  BMI 21.89 kg/m2  SpO2 90% Physical Exam  Constitutional: She is oriented to person, place, and time. She appears well-developed and well-nourished. No distress.  HENT:  Head: Normocephalic and atraumatic.  Right Ear: External ear normal.  Left Ear: External ear normal.  Nose: Nose normal.  Mouth/Throat: Oropharynx is clear and moist. No oropharyngeal exudate.  Eyes: EOM are normal. Pupils are equal, round, and reactive to light.  Neck: Normal range of  motion and full passive range of motion without pain. Neck supple. No spinous process tenderness and no muscular tenderness present.  Cardiovascular: Normal rate, normal heart sounds and intact distal pulses.  An irregularly irregular rhythm present.  No murmur heard. Pulmonary/Chest: Effort normal. No respiratory distress. She has no wheezes. She has no rales. She exhibits no tenderness.  Abdominal: Soft. She exhibits no distension. There is tenderness.    Musculoskeletal: Normal range of motion. She exhibits no edema.       Left ankle: She  exhibits ecchymosis (over the lower left extremity from below the knee to the top of the foot). She exhibits normal pulse. Tenderness (over ecchymosis).  Able to range all extremities and joints without pain or difficulty except for left ankle.  Pelvis stable.    Neurological: She is alert and oriented to person, place, and time. No cranial nerve deficit or sensory deficit. She exhibits normal muscle tone.  Skin: Skin is warm and dry. No rash noted. She is not diaphoretic.  Vitals reviewed.   ED Course  Procedures (including critical care time)  CRITICAL CARE Performed by: Earlie Server   Total critical care time: 75 minutes  Critical care time was exclusive of separately billable procedures and treating other patients.  Critical care was necessary to treat or prevent imminent or life-threatening deterioration.  Critical care was time spent personally by me on the following activities: development of treatment plan with patient and/or surrogate as well as nursing, discussions with consultants, evaluation of patient's response to treatment, examination of patient, obtaining history from patient or surrogate, ordering and performing treatments and interventions, ordering and review of laboratory studies, ordering and review of radiographic studies, pulse oximetry and re-evaluation of patient's condition.   Labs Review Labs Reviewed  CBC WITH  DIFFERENTIAL/PLATELET - Abnormal; Notable for the following:    WBC 30.4 (*)    RBC 3.73 (*)    Hemoglobin 11.6 (*)    Neutro Abs 28.6 (*)    Lymphs Abs 0.6 (*)    Monocytes Absolute 1.2 (*)    All other components within normal limits  COMPREHENSIVE METABOLIC PANEL - Abnormal; Notable for the following:    Chloride 97 (*)    Glucose, Bld 121 (*)    BUN 34 (*)    Creatinine, Ser 1.80 (*)    Calcium 8.6 (*)    Total Protein 6.1 (*)    Albumin 2.9 (*)    AST 218 (*)    ALT 97 (*)    GFR calc non Af Amer 23 (*)    GFR calc Af Amer 26 (*)    All other components within normal limits  CK - Abnormal; Notable for the following:    Total CK 287 (*)    All other components within normal limits  TROPONIN I - Abnormal; Notable for the following:    Troponin I 0.29 (*)    All other components within normal limits  URINALYSIS, ROUTINE W REFLEX MICROSCOPIC (NOT AT [facility]) - Abnormal; Notable for the following:    Color, Urine AMBER (*)    APPearance CLOUDY (*)    Hgb urine dipstick SMALL (*)    Bilirubin Urine SMALL (*)    Protein, ur 30 (*)    Leukocytes, UA SMALL (*)    All other components within normal limits  URINE MICROSCOPIC-ADD ON - Abnormal; Notable for the following:    Bacteria, UA MANY (*)    Casts GRANULAR CAST (*)    All other components within normal limits  LACTIC ACID, PLASMA - Abnormal; Notable for the following:    Lactic Acid, Venous 2.4 (*)    All other components within normal limits  PROTIME-INR - Abnormal; Notable for the following:    Prothrombin Time 17.5 (*)    All other components within normal limits  APTT - Abnormal; Notable for the following:    aPTT 39 (*)    All other components within normal limits  CBC - Abnormal; Notable for the following:  WBC 18.4 (*)    RBC 3.10 (*)    Hemoglobin 9.5 (*)    HCT 30.4 (*)    Platelets 112 (*)    All other components within normal limits  CREATININE, SERUM - Abnormal; Notable for the following:     Creatinine, Ser 1.55 (*)    GFR calc non Af Amer 27 (*)    GFR calc Af Amer 32 (*)    All other components within normal limits  I-STAT CG4 LACTIC ACID, ED - Abnormal; Notable for the following:    Lactic Acid, Venous 5.05 (*)    All other components within normal limits  I-STAT CG4 LACTIC ACID, ED - Abnormal; Notable for the following:    Lactic Acid, Venous 3.16 (*)    All other components within normal limits  CULTURE, BLOOD (ROUTINE X 2)  CULTURE, BLOOD (ROUTINE X 2)  MRSA PCR SCREENING  URINE CULTURE  LIPASE, BLOOD  LACTIC ACID, PLASMA  BASIC METABOLIC PANEL  CBC    Imaging Review Ct Abdomen Pelvis Wo Contrast  01/26/2015  CLINICAL DATA:  Vomiting at 2 a.m. last night. Tachypnea and hypotension. EXAM: CT ABDOMEN AND PELVIS WITHOUT CONTRAST TECHNIQUE: Multidetector CT imaging of the abdomen and pelvis was performed following the standard protocol without IV contrast. COMPARISON:  CT abdomen and pelvis 07/02/2006. FINDINGS: There small bilateral pleural effusions. No pericardial effusion. Cardiomegaly is noted. Pacing device is in place. The lungs demonstrate basilar atelectasis. Calcific coronary artery disease is seen. Densely calcified splenic artery aneurysm measuring 1.1 cm is unchanged. There is extensive aortoiliac atherosclerosis. Abdominal aortic aneurysm measuring 4.2 cm AP by 4.2 cm transverse by 4.5 cm craniocaudal is identified which had measured 2.8 cm in diameter on the prior exam. Mild haziness is seen about the aneurysm although there is some although there is some slight motion on the examination. The liver, spleen, adrenal glands and pancreas are unremarkable. There is some renal atrophy, more notable on the right, which is unchanged in appearance. A single tiny gallstone is noted there is no evidence of cholecystitis. Streak artifact in the pelvis from a right hip replacement somewhat limits visualization. There is diverticular disease appearing worst in the sigmoid  without evidence of diverticulitis. The stomach, small bowel and appendix appear normal. No lytic or sclerotic bony lesion is identified. Left hip osteoarthritis is seen. Right hip replacement is in place. IMPRESSION: Interval enlargement of an infrarenal abdominal aortic aneurysm with mild haziness about the aneurysm which could be secondary to hemorrhage. CT angiogram of the abdomen and pelvis is recommended for further evaluation. Small bilateral pleural effusions. Tiny gallstone without evidence cholecystitis. Diverticulosis without diverticulitis. Critical Value/emergent results were called by telephone at the time of interpretation on 01/26/2015 at 11:10 am to Dr. Lonia Skinner , who verbally acknowledged these results. Electronically Signed   By: Inge Rise M.D.   On: 01/26/2015 11:10   Dg Chest 2 View  01/26/2015  CLINICAL DATA:  Acute nausea and vomiting, shortness of breath, COPD, history of CHF EXAM: CHEST  2 VIEW COMPARISON:  11/03/2014 FINDINGS: Increased thoracic kyphosis evident. Heart is enlarged without superimposed CHF or edema. No focal pneumonia, collapse or consolidation. Background COPD/ emphysema suspected. Left subclavian 2 lead pacer noted. Aorta is atherosclerotic. Bones are osteopenic. IMPRESSION: Cardiomegaly without superimposed CHF or pneumonia Background COPD/ emphysema Atherosclerosis Electronically Signed   By: Jerilynn Mages.  Shick M.D.   On: 01/26/2015 11:37   Dg Tibia/fibula Left  01/26/2015  CLINICAL DATA:  Fall, left lower  extremity pain and swelling EXAM: LEFT TIBIA AND FIBULA - 2 VIEW COMPARISON:  01/26/2015 FINDINGS: Diffuse osteopenia. Atrophy of the soft tissues. No acute osseous finding or displaced fracture. Left tibia and fibula appear intact. No definite soft tissue abnormality. Peripheral atherosclerosis noted. IMPRESSION: Osteopenia.  No acute osseous finding Peripheral atherosclerosis Electronically Signed   By: Jerilynn Mages.  Shick M.D.   On: 01/26/2015 11:42   Dg Ankle  Complete Left  01/26/2015  CLINICAL DATA:  Nausea at 2 a.m. last night. The patient went to the ground and was unable to get up. She was found down this morning. Left ankle pain and swelling. Initial encounter. EXAM: LEFT ANKLE COMPLETE - 3+ VIEW COMPARISON:  None. FINDINGS: No acute bony or joint abnormality is identified. Bones appear osteopenic. Mild soft tissue swelling about the ankle is noted. IMPRESSION: Mild soft tissue swelling without underlying acute bony or joint abnormality. Osteopenia. Electronically Signed   By: Inge Rise M.D.   On: 01/26/2015 11:43   Dg Foot Complete Left  01/26/2015  CLINICAL DATA:  Left lower extremity and foot bruising and swelling. Fall. EXAM: LEFT FOOT - COMPLETE 3+ VIEW COMPARISON:  01/26/2015 FINDINGS: Bones are osteopenic. No acute osseous finding or malalignment. No definite soft tissue abnormality. Degenerative changes of the midfoot involving the navicular bone with sclerosis and joint space loss. IMPRESSION: Osteopenia and midfoot degenerative changes. No acute osseous finding. Electronically Signed   By: Jerilynn Mages.  Shick M.D.   On: 01/26/2015 11:39   I have personally reviewed and evaluated these images and lab results as part of my medical decision-making.   EKG Interpretation   Date/Time:  Sunday January 26 2015 10:21:46 EST Ventricular Rate:  96 PR Interval:    QRS Duration: 108 QT Interval:  402 QTC Calculation: 507 R Axis:   -37 Text Interpretation:  Atrial fibrillation with occasional  ventricular-paced complexes and with premature ventricular or aberrantly  conducted complexes Left axis deviation Possible Anterior infarct , age  undetermined ST \\T \ T wave abnormality, consider inferior ischemia  Abnormal ECG No significant change since last tracing Confirmed by Arval Brandstetter,  Jenasis Straley ([id number]) on 01/26/2015 10:38:55 AM      MDM  Patient seen and evaluated at bedside.  Hypotensive on arrival but with normal mentation and able to hold  conversation.  Patient started on IV fluids.  Labs with multiple abnormalities with elevated troponin, elevated lactic acid, elevated Cr, leukocytosis.  CT abdomen had to be completed without contrast and was concerning for haziness around a significantly increased AAA.  This was discussed with patient and her daughter who did want me to talk to the vascular surgeon although patient said she would likely not agree to surgery if offered and despite the fact that the patient is a DNR and would not want to receive pressors.  Discussed with Dr. Bridgett Larsson who said vascular surgery would recommend a palliative approach at this time and would not recommend pursuing this issue more.  At request of family and patient case also discussed with palliative care who said that they would follow the patient in consultation and discuss palliative care options.  In light of leukocytosis, lactic acidosis, hypotension blood and urine cultures sent and patient started on broad spectrum antibiotics.  Discussed with Dr. Tana Coast who agreed with admission and patient as admitted to the stepdown unit at [facility] for continued management. Final diagnoses:  Lactic acidosis  Hypotension, unspecified hypotension type  Fall, initial encounter  Leukocytosis  Sepsis, due to unspecified organism ([place])  1. Hypotension  2. Lactic acidosis  3. Leukocytosis  4. Sepsis, possible 5. Possible ruptured AAA    Harvel Quale, MD 01/26/15 [id number]  Harvel Quale, MD 01/26/15 [phone]

## 2015-01-26 NOTE — Progress Notes (Signed)
ANTIBIOTIC CONSULT NOTE - INITIAL  Pharmacy Consult for vancomycin and zosyn Indication: rule out sepsis  Allergies  Allergen Reactions  . Nubain [Nalbuphine Hcl] Anaphylaxis  . Statins     Leg weakness  . Iodine Rash    Patient Measurements: Weight: 103 lb (46.72 kg)   Vital Signs: Temp: 97.5 F (36.4 C) (11/13 1022) Temp Source: Oral (11/13 1022) BP: 91/45 mmHg (11/13 1230) Pulse Rate: 71 (11/13 1230) Intake/Output from previous day:   Intake/Output from this shift: Total I/O In: 2000 [Other:2000] Out: 20 [Urine:20]  Labs:  Recent Labs  01/26/15 1030  WBC 30.4*  HGB 11.6*  PLT 157  CREATININE 1.80*   Estimated Creatinine Clearance: 13.8 mL/min (by C-G formula based on Cr of 1.8). No results for input(s): VANCOTROUGH, VANCOPEAK, VANCORANDOM, GENTTROUGH, GENTPEAK, GENTRANDOM, TOBRATROUGH, TOBRAPEAK, TOBRARND, AMIKACINPEAK, AMIKACINTROU, AMIKACIN in the last 72 hours.   Microbiology: No results found for this or any previous visit (from the past 720 hour(s)).  Medical History: Past Medical History  Diagnosis Date  . GI bleed 2007  . Hypertension   . TIA (transient ischemic attack)   . Hypothyroidism   . Abdominal aortic aneurysm ([place])   . CAD (coronary artery disease)   . SSS (sick sinus syndrome) ([place]) 10/05/2007    Medtronic Adapta  . Myocardial infarction ([place])   . Atrial fibrillation ([place])   . COPD (chronic obstructive pulmonary disease) ([place])   . Asthma     hx  . Anemia     hx  . Blood transfusion   . UTI (lower urinary tract infection)     hx  . Renal failure, chronic   . H/O hiatal hernia   . GERD (gastroesophageal reflux disease)   . Stroke ([place])   . Osteoporosis   . Blood transfusion without reported diagnosis   . Pacemaker    Assessment: 81 yo F at [facility] to start vancomycin and zosyn per pharmacy for sepsis.   Wt 46.7 kg, WBC 30.4, creat 1.8. LA 5.05, hypotensive.  May have AAA rupture. CXR neg for PNA.  vanc 11/13>> Zosyn  11/13>>  11/13 BCx2>> 11/13 Ucx>>   Goal of Therapy:  Vancomycin trough level 15-20 mcg/ml  Plan: - zosyn 3.375 gm IV x1 then zosyn 2.25 IV q8h - vancomycin 1000 mg IV x1 then vancomycin 500 mg IV q48h - f/u renal fxn, wbc, temp, culture data, clinical progress - vanc levels as needed  Eudelia Bunch, Pharm.D. [id number] 01/26/2015 1:07 PM

## 2015-01-26 NOTE — ED Notes (Signed)
Patient is off unit at CT and X-ray.

## 2015-01-26 NOTE — Progress Notes (Addendum)
CRITICAL VALUE ALERT  Critical value received:  LA 2.4  Date of notification:  01/26/2015   Time of notification:  8:32 PM   Critical value read back:Yes.    Nurse who received alert:  Monico Hoar, RN   MD notified (1st page):  Rogue Bussing, NP  Time of first page:  8:32 PM    Responding MD:  Rogue Bussing, NP   Time MD responded: 10:43 PM

## 2015-01-26 NOTE — Plan of Care (Signed)
Called by [facility] for patient, Ms Tikia, [facility]  Referring M.D.: Dr Lonia Skinner  Briefly 81 year old female with diastolic CHF, paroxysmal atrial fibrillation, sinusdysfunction, hypo-thyroidism, prior TIA and multiple medical issues was apparently found down in her home for unknown period of time. Subsequently patient had nausea, vomiting, abdominal pain and mechanical fall. CT abdomen and pelvis in ED showed interval enlargement of infrarenal AAA with mild haziness about the aneurysm which could be hemorrhage.  CBC showed white count of 30.4, hemoglobin 11.6, creatinine 1.8, lactic acid 5.05, troponin 0.29 UA still pending Patient was hypotensive in ED with systolic between [id number] and after 2 L 91/45  EDP, Dr Alfonse Spruce discussed with vascular surgery, Dr. Bridgett Larsson as patient may have AAA rupture who recommended palliative approach at this time. Dr. Alfonse Spruce also discussed with patient and her family who did not want any further inflammations or surgery.  EDP also called palliative care, who recommended transfer to [facility] and they will see the patient in consult. EDP also discussed with the family who requested for DNR/DNI status and no pressors.   Chest x-ray negative for pneumonia, UA/culture pending  Accepted to stepdown unit. Please consult palliative medicine when patient arrives.    Kolden Dupee M.D. [facility] 01/26/2015, 12:56 PM  Pager: [phone]

## 2015-01-27 DIAGNOSIS — Z7189 Other specified counseling: Secondary | ICD-10-CM

## 2015-01-27 DIAGNOSIS — I10 Essential (primary) hypertension: Secondary | ICD-10-CM

## 2015-01-27 DIAGNOSIS — Z515 Encounter for palliative care: Secondary | ICD-10-CM

## 2015-01-27 LAB — CBC
HCT: 30.5 % — ABNORMAL LOW (ref 36.0–46.0)
Hemoglobin: 9.3 g/dL — ABNORMAL LOW (ref 12.0–15.0)
MCH: 30.5 pg (ref 26.0–34.0)
MCHC: 30.5 g/dL (ref 30.0–36.0)
MCV: 100 fL (ref 78.0–100.0)
PLATELETS: 115 10*3/uL — AB (ref 150–400)
RBC: 3.05 MIL/uL — AB (ref 3.87–5.11)
RDW: 14.9 % (ref 11.5–15.5)
WBC: 14.8 10*3/uL — AB (ref 4.0–10.5)

## 2015-01-27 LAB — BASIC METABOLIC PANEL
Anion gap: 5 (ref 5–15)
BUN: 25 mg/dL — ABNORMAL HIGH (ref 6–20)
CHLORIDE: 111 mmol/L (ref 101–111)
CO2: 24 mmol/L (ref 22–32)
CREATININE: 1.51 mg/dL — AB (ref 0.44–1.00)
Calcium: 7.3 mg/dL — ABNORMAL LOW (ref 8.9–10.3)
GFR calc non Af Amer: 28 mL/min — ABNORMAL LOW (ref >60)
GFR, EST AFRICAN AMERICAN: 33 mL/min — AB (ref >60)
Glucose, Bld: 81 mg/dL (ref 65–99)
POTASSIUM: 4.3 mmol/L (ref 3.5–5.1)
SODIUM: 140 mmol/L (ref 135–145)

## 2015-01-27 MED ORDER — ALBUTEROL SULFATE (2.5 MG/3ML) 0.083% IN NEBU
2.5000 mg | INHALATION_SOLUTION | Freq: Once | RESPIRATORY_TRACT | Status: AC
  Administered 2015-01-28: 2.5 mg via RESPIRATORY_TRACT
  Filled 2015-01-27: qty 3

## 2015-01-27 NOTE — Progress Notes (Signed)
Utilization Review Completed.  

## 2015-01-27 NOTE — Consult Note (Signed)
Consultation Note Date: 01/27/2015   Patient Name: [name]  DOB: [birth year]  MRN: [id number]  Age / Sex: 81 y.o., female  PCP: Brandy Chandler, NP Referring Physician: Mendel Corning, MD  Reason for Consultation: Establishing goals of care  Clinical Assessment/Narrative: [name] is a 81 y.o. female with PMH of AAA, HTN, hypothyroidism, CAD, CHF who lives alone. She fell in the middle of the night and was brought to the ED the next day after being found on the floor by her daughter.  On exam she is found to have cellulitis of her left leg. She was recently admitted for the same. She is severely deconditioned and has lost a great deal of weight due to lack of appetite. She spend most of her day in bed, sleeping. She has tried an appetite stimulant in the past which was ineffective. They have requested a palliative care consult to discuss goals of care.  I met today with [name], her son, and her daughter. We had a long discussion regarding her current clinical course as well as her goals moving forward.   She reports the most important things moving forward are her family, being comfortable, and remaining as independent as possible. She is agreeable to going for rehabilitation as she believes this would be beneficial to her getting stronger again.  She reports the doctors have done a good job explaining things to her and she understands that she has a current infection which is now being treated. Additionally, there is a question of continued leakage of her aortic aneurysm.  There is no plan for further workup or surgery if this is in fact the case.    We discussed at length that while she has acute problems that prompted her admission, this is on top of chronic medical conditions including congestive heart failure. We talked about her course over the past several months including decrease in her nutrition,  cognition, and functional status. We talked about how this may be at least partially contributed to acute processes and that this portion may be reversible. We also talked about the fact that she continues to have chronic diseases will continue to progress. She reports that her goal is to maintain her independence as long as possible and be as healthy and happy as long as possible. The same time, she does endorse that she is coming to the end of her life at the age of 64 that she wants to ensure that her comfort and quality of life or something that her consider when making medical decisions.  Contacts/Participants in Discussion: Patient, her son, and her daughter Primary Decision Maker: The patient   Relationship to Patient Self HCPOA: None on chart   SUMMARY OF RECOMMENDATIONS - [name] wants to continue pursuing therapies that are likely to add quality or time to her life. She does not want to pursue any aggressive measures and is not interested in any therapies that are not likely to add quality time to her life. - She currently lives independently, and her goal is to be strong enough to continue to do this. She does understand that she has chronic conditions that are going to continue to progress and this may not be possible - [name], her family, and I discussed that the hospital can be useful as long as she is getting well enough from care she receives at the hospital to enjoy time at home, but there is going to come a time in the  near future where, if her goal is to be at home, she may be better served to plan on being at home and bringing care to her at home rather repeated trips to the hospital. We discussed hospice as a tool that may be beneficial in this goal as she reaches a point where we are trying to fix problems that are not fixable. - She is in agreement that a good plan would be to plan to pursue placement for rehabilitation. She has done well with rehabbing in the past. If she does  well and continues to thrive, I encouraged they continue with this plan. If, however she is unable to regain function and she continues to decline, I recommended that she speak with her PCP to determine if she may be better served by focusing care on staying out of the hospital with support of organization such as hospice. - We also discussed completion of a MOST form prior to discharge. Family believes this would be helpful. We'll meet again tomorrow 1:00 in order to complete.  Code Status/Advance Care Planning: DNR    Code Status Orders        Start     Ordered   01/26/15 1801  Do not attempt resuscitation (DNR)   Continuous    Question Answer Comment  In the event of cardiac or respiratory ARREST Do not call a "code Brandy"   In the event of cardiac or respiratory ARREST Do not perform Intubation, CPR, defibrillation or ACLS   In the event of cardiac or respiratory ARREST Use medication by any route, position, wound care, and other measures to relive pain and suffering. May use oxygen, suction and manual treatment of airway obstruction as needed for comfort.      01/26/15 1801    Advance Directive Documentation        Most Recent Value   Type of Advance Directive  Out of facility DNR (pink MOST or yellow form)   Pre-existing out of facility DNR order (yellow form or pink MOST form)     "MOST" Form in Place?        Symptom Management:   She currently reports feeling symptomatically well  Palliative Prophylaxis:   Bowel Regimen and Delirium Protocol  Psycho-social/Spiritual:  Support System: Strong Desire for further Chaplaincy support: No  Prognosis: Unable to determine  Discharge Planning: [name] for rehab with Palliative care service follow-up   Chief Complaint/ Primary Diagnoses: Present on Admission:  . Sepsis ([name]) . Hypertension . Hypotension . Paroxysmal atrial fibrillation (HCC) . Pacemaker - dual chamber Medtronic 2009 . Thrombocytopenia  ([name])  I have reviewed the medical record, interviewed the patient and family, and examined the patient. The following aspects are pertinent.  Past Medical History  Diagnosis Date  . GI bleed 2007  . Hypertension   . TIA (transient ischemic attack)   . Hypothyroidism   . Abdominal aortic aneurysm ([place])   . CAD (coronary artery disease)   . SSS (sick sinus syndrome) ([place]) 10/05/2007    Medtronic Adapta  . Myocardial infarction ([place])   . Atrial fibrillation ([place])   . COPD (chronic obstructive pulmonary disease) ([place])   . Asthma     hx  . Anemia     hx  . Blood transfusion   . UTI (lower urinary tract infection)     hx  . Renal failure, chronic   . H/O hiatal hernia   . GERD (gastroesophageal reflux disease)   . Stroke ([place])   .  Osteoporosis   . Blood transfusion without reported diagnosis   . Pacemaker    Social History   Social History  . Marital Status: Widowed    Spouse Name: N/A  . Number of Children: 4  . Years of Education: N/A   Occupational History  . Retired    Social History Main Topics  . Smoking status: Former Smoker    Types: Cigarettes  . Smokeless tobacco: Never Used     Comment: quit 40 yrs ago- was smoking 2 packs a day at the most  . Alcohol Use: No  . Drug Use: No  . Sexual Activity: Yes    Birth Control/ Protection: Post-menopausal   Other Topics Concern  . None   Social History Narrative   Diet:      Do you drink/ eat things with caffeine? yes      Marital status:    widow                           What year were you married ?  1940      Do you live in a house, apartment,assistred living, condo, trailer, etc.)?  Mobile home      Is it one or more stories? 1      How many persons live in your home ?  1      Do you have any pets in your home ?(please list)  1 cat      Current or past profession: factory worker      Do you exercise?  yes                           Type & how often: small amount      Do you have a living will?   no      Do you have a DNR form?     no                  If not, do you want to discuss one?       Do you have signed POA?HPOA forms?  no               If so, please bring to your        appointment         Family History  Problem Relation Age of Onset  . Emphysema Father   . Heart disease Mother   . Cancer Brother   . Cancer Brother   . Cancer Sister   . Cancer Sister   . Diabetes Daughter   . Atrial fibrillation Daughter   . Atrial fibrillation Daughter   . Stroke Daughter   . Heart Problems Son    Scheduled Meds: . amiodarone  200 mg Oral Daily  . clopidogrel  75 mg Oral Daily  . heparin  5,000 Units Subcutaneous 3 times per day  . lactose free nutrition  1 Container Oral Q24H  . levothyroxine  125 mcg Oral Daily  . pantoprazole  40 mg Oral Daily  . piperacillin-tazobactam (ZOSYN)  IV  2.25 g Intravenous 3 times per day  . [START ON 01/28/2015] vancomycin  500 mg Intravenous Q48H   Continuous Infusions: . sodium chloride 75 mL/hr at 01/26/15 1727   PRN Meds:.acetaminophen **OR** acetaminophen, alum & mag hydroxide-simeth, ondansetron **OR** ondansetron (ZOFRAN) IV Medications Prior to Admission:  Prior to Admission medications   Medication Sig  Start Date End Date Taking? Authorizing Provider  amiodarone (PACERONE) 200 MG tablet TAKE 1 TABLET BY MOUTH DAILY. 08/05/14  Yes Mihai Croitoru, MD  Biotin 1000 MCG tablet Take 1,000 mcg by mouth daily.   Yes Historical Provider, MD  Calcium Carb-Cholecalciferol (CALCIUM 600 + D PO) Take 1 tablet by mouth daily.   Yes Historical Provider, MD  clopidogrel (PLAVIX) 75 MG tablet Take 1 tablet (75 mg total) by mouth daily. 07/01/14  Yes Mihai Croitoru, MD  feeding supplement (BOOST HIGH PROTEIN) LIQD Take 1 Container by mouth daily.   Yes Historical Provider, MD  ferrous sulfate 325 (65 FE) MG tablet Take 325 mg by mouth daily with breakfast.    Yes Historical Provider, MD  furosemide (LASIX) 20 MG tablet Take 1 tablet (20 mg total)  by mouth daily. 11/07/14  Yes Domenic Polite, MD  levothyroxine (SYNTHROID, LEVOTHROID) 125 MCG tablet TAKE 1 TABLET BY MOUTH EVERY DAY 10/30/13  Yes Lorretta Harp, MD  nitroGLYCERIN (NITROSTAT) 0.4 MG SL tablet Place 1 tablet (0.4 mg total) under the tongue every 5 (five) minutes as needed for chest pain (For a total of 3 tablets, if chest pain persist to call 911). 10/15/14  Yes Brandy Chandler, NP  pantoprazole (PROTONIX) 40 MG tablet TAKE 1 TABLET (40 MG TOTAL) BY MOUTH DAILY. 11/11/14  Yes Brandy Chandler, NP  potassium chloride SA (K-DUR,KLOR-CON) 20 MEQ tablet Take 20 mEq by mouth daily.   Yes Historical Provider, MD  vitamin B-12 (CYANOCOBALAMIN) 1000 MCG tablet Take 1,000 mcg by mouth daily.   Yes Historical Provider, MD   Allergies  Allergen Reactions  . Nubain [Nalbuphine Hcl] Anaphylaxis  . Mirtazapine     "Weakness in legs"  . Statins     Leg weakness  . Iodine Rash    Review of Systems  Constitutional: Positive for activity change and appetite change.  Cardiovascular: Positive for leg swelling.  Musculoskeletal: Positive for back pain.  Skin: Positive for color change and rash.  All other systems reviewed and are negative.   Physical Exam  Vital Signs: BP 96/42 mmHg  Pulse 76  Temp(Src) 98.2 F (36.8 C) (Oral)  Resp 19  Ht _0  (1.575 m)  Wt 54.3 kg (119 lb 11.4 oz)  BMI 21.89 kg/m2  SpO2 92%  SpO2: SpO2: 92 % O2 Device:SpO2: 92 % O2 Flow Rate: .O2 Flow Rate (L/min): 2 L/min  IO: Intake/output summary:  Intake/Output Summary (Last 24 hours) at 01/27/15 1932 Last data filed at 01/27/15 1300  Gross per 24 hour  Intake   1780 ml  Output    250 ml  Net   1530 ml    LBM: Last BM Date: 01/25/15 Baseline Weight: Weight: 46.72 kg (103 lb) Most recent weight: Weight: 54.3 kg (119 lb 11.4 oz)      Palliative Assessment/Data:  Flowsheet Rows        Most Recent Value   Intake Tab    Referral Department  Hospitalist   Unit at Time of Referral   Intermediate Care Unit   Palliative Care Primary Diagnosis  Sepsis/Infectious Disease   Date Notified  01/26/15   Palliative Care Type  New Palliative care   Reason for referral  Clarify Goals of Care   Date of Admission  01/26/15   Date first seen by Palliative Care  01/27/15   # of days Palliative referral response time  1 Day(s)   # of days IP prior to Palliative referral  0  Clinical Assessment    Psychosocial & Spiritual Assessment    Palliative Care Outcomes       Additional Data Reviewed:  CBC:    Component Value Date/Time   WBC 14.8* 01/27/2015 0551   WBC 6.4 08/01/2014 1401   HGB 9.3* 01/27/2015 0551   HCT 30.5* 01/27/2015 0551   HCT 38.0 08/01/2014 1401   PLT 115* 01/27/2015 0551   MCV 100.0 01/27/2015 0551   NEUTROABS 28.6* 01/26/2015 1030   NEUTROABS 4.0 08/01/2014 1401   LYMPHSABS 0.6* 01/26/2015 1030   LYMPHSABS 1.4 08/01/2014 1401   MONOABS 1.2* 01/26/2015 1030   EOSABS 0.0 01/26/2015 1030   BASOSABS 0.0 01/26/2015 1030   BASOSABS 0.0 08/01/2014 1401   Comprehensive Metabolic Panel:    Component Value Date/Time   NA 140 01/27/2015 0551   NA 144 08/01/2014 1401   K 4.3 01/27/2015 0551   CL 111 01/27/2015 0551   CO2 24 01/27/2015 0551   BUN 25* 01/27/2015 0551   BUN 24 08/01/2014 1401   CREATININE 1.51* 01/27/2015 0551   CREATININE 1.16* 01/22/2015 1203   GLUCOSE 81 01/27/2015 0551   GLUCOSE 88 08/01/2014 1401   CALCIUM 7.3* 01/27/2015 0551   AST 218* 01/26/2015 1030   ALT 97* 01/26/2015 1030   ALKPHOS 72 01/26/2015 1030   BILITOT 1.1 01/26/2015 1030   BILITOT 0.3 08/01/2014 1401   PROT 6.1* 01/26/2015 1030   PROT 6.0 08/01/2014 1401   ALBUMIN 2.9* 01/26/2015 1030   ALBUMIN 3.9 08/01/2014 1401     Time In: 1015 Time Out: 1125 Time Total: 70 Greater than 50%  of this time was spent counseling and coordinating care related to the above assessment and plan.  Signed by: Micheline Rough, MD  Micheline Rough, MD  01/27/2015, 7:32 PM  Please  contact Palliative Medicine Team phone at [phone] for questions and concerns.

## 2015-01-27 NOTE — Progress Notes (Signed)
[facility]                                                                              Patient Demographics  [name], is a 81 y.o. female, DOB - 11/16/1917, [id number]  Admit date - 01/26/2015   Admitting Physician Ripudeep Krystal Eaton, MD  Outpatient Primary MD for the patient is Lauree Chandler, NP  LOS - 1   Chief Complaint  Patient presents with  . Fall       Brief HPI  Per Dr. Wynelle Cleveland admit note on 11/13  [name] is a 81 y.o. female with PMH of AAA, HTN, hypothyroidism, CAD, CHF who lives alone. She fell in the middle of the night last night and although she pressed her life alert button many times, no one came to help her. Her daughter who lives next door went to check on her and found her on the floor this AM. The patient states she vomited this AM. No abdominal pain or diarrhea. She had chills this AM but no noted fever. CT of the abdomen in the ER revealed an enlarging AAA with possible extravasation of blood. Her SBP was in the 80s on arrival to the ER and she was given 2 L of IVF which has helped it to improve to low 100s. On exam she is found to have cellulitis of her left leg. She was recently admitted for the same. Further history obtained from daughters. The patient is severely deconditioned and has lost a great deal of weight due to lack of appetite. She spend most of her day in bed, sleeping. She has tried an appetite stimulant in the past which was ineffective. They have requested a palliative care consults.    Assessment & Plan   Principal Problem:  Cellulitis of leg, left/ sepsis- lactic acidosis, leukocytosis - 2 areas of cellulitis on left leg -Continue IV vancomycin and Zosyn, follow blood cultures  Active Problems:  Hypotension - Currently borderline but stable, improved with IV fluid hydration - Metoprolol was recently discontinued due to hypotension  - Continue to hold Lasix   Thrombocytopenia  - Improving, acute on  chronic possibly secondary to acute infection  AAA - possibly rupture based on CT abdomen and pelvis - no abdominal pain at this time- BP improved as well - DNR- palliative medicine consulted per family's wishes - EDP had consulted vascular surgery, Dr. Bridgett Larsson who had recommended palliative approach and patient/family did not want any surgery or interventions  Acute on CKD 3 -Improving, continue gentle hydration, hold Lasix for now   Mildly elevated CK and Troponin with h/o CAD - due to sepsis and laying on the floor- follow- cont Plavix   Paroxysmal atrial fibrillation  - cont Amiodarone- rate controlled - not a good candidate for anticoagulation - on Plavix   Pacemaker - dual chamber Medtronic 2009  Hypothyroid continue Synthroid  Mechanical fall  - PT evaluation pending   Code Status:  DNR/DNI  Family Communication: Discussed in detail with the patient, all imaging results, lab results explained to the patient and her daughter, Pamala Hurry on the phone    Disposition  Plan:  will likely need skilled nursing facility  Time Spent in mins: 25  Procedures  CT abdomen and pelvis   Consults   palliative medicine  DVT Prophylaxis  heparin subcutaneous   Medications  Scheduled Meds: . amiodarone  200 mg Oral Daily  . clopidogrel  75 mg Oral Daily  . heparin  5,000 Units Subcutaneous 3 times per day  . lactose free nutrition  1 Container Oral Q24H  . levothyroxine  125 mcg Oral Daily  . pantoprazole  40 mg Oral Daily  . piperacillin-tazobactam (ZOSYN)  IV  2.25 g Intravenous 3 times per day  . [START ON 01/28/2015] vancomycin  500 mg Intravenous Q48H   Continuous Infusions: . sodium chloride 75 mL/hr at 01/26/15 1727   PRN Meds:.acetaminophen **OR** acetaminophen, alum & mag hydroxide-simeth, ondansetron **OR** ondansetron (ZOFRAN) IV   Antibiotics   Anti-infectives    Start     Dose/Rate Route Frequency Ordered Stop   01/28/15 1400  vancomycin (VANCOCIN) 500 mg  in sodium chloride 0.9 % 100 mL IVPB     500 mg 100 mL/hr over 60 Minutes Intravenous Every 48 hours 01/26/15 1305     01/26/15 2200  piperacillin-tazobactam (ZOSYN) IVPB 2.25 g     2.25 g 100 mL/hr over 30 Minutes Intravenous 3 times per day 01/26/15 1305     01/26/15 1315  piperacillin-tazobactam (ZOSYN) IVPB 3.375 g     3.375 g 100 mL/hr over 30 Minutes Intravenous  Once 01/26/15 1305 01/26/15 1420   01/26/15 1315  vancomycin (VANCOCIN) IVPB 1000 mg/200 mL premix     1,000 mg 200 mL/hr over 60 Minutes Intravenous  Once 01/26/15 1305 01/26/15 1531        Subjective:   [name] was seen and examined today. Patient denies dizziness, chest pain, shortness of breath, abdominal pain, N/V/D/C, new weakness, numbess, tingling. No fevers or chills, no acute events overnight   Objective:   Blood pressure 92/37, pulse 72, temperature 98.2 F (36.8 C), temperature source Oral, resp. rate 25, height 5\' 2"  (1.575 m), weight 54.3 kg (119 lb 11.4 oz), SpO2 98 %.  Wt Readings from Last 3 Encounters:  01/26/15 54.3 kg (119 lb 11.4 oz)  01/22/15 47.854 kg (105 lb 8 oz)  12/09/14 48.444 kg (106 lb 12.8 oz)     Intake/Output Summary (Last 24 hours) at 01/27/15 1112 Last data filed at 01/27/15 0530  Gross per 24 hour  Intake 3431.25 ml  Output    270 ml  Net 3161.25 ml    Exam  General: Alert and oriented x 3, NAD  HEENT:  PERRLA, EOMI, Anicteric Sclera, mucous membranes moist.   Neck: Supple, no JVD, no masses  CVS: S1 S2 auscultated,2/6 murmur at the apex  Respiratory: Clear to auscultation bilaterally, no wheezing, rales or rhonchi  Abdomen: Soft, nontender, nondistended, + bowel sounds  Ext: no cyanosis clubbing. Erythema on the left foot and leg, tender and warm to touch  Neuro: AAOx3, Cr N's II- XII. Strength 5/5 upper and lower extremities bilaterally  Skin: No rashes  Psych: Normal affect and demeanor, alert and oriented x3    Data Review   Micro  Results Recent Results (from the past 240 hour(s))  Blood culture (routine x 2)     Status: None (Preliminary result)   Collection Time: 01/26/15  1:05 PM  Result Value Ref Range Status   Specimen Description BLOOD LEFT ANTECUBITAL  Final   Special Requests BOTTLES DRAWN AEROBIC AND ANAEROBIC 5CC  EACH  Final   Culture PENDING  Incomplete   Report Status PENDING  Incomplete  Blood culture (routine x 2)     Status: None (Preliminary result)   Collection Time: 01/26/15  1:50 PM  Result Value Ref Range Status   Specimen Description BLOOD LEFT WRIST  Final   Special Requests IN PEDIATRIC BOTTLE 3CC  Final   Culture PENDING  Incomplete   Report Status PENDING  Incomplete  MRSA PCR Screening     Status: None   Collection Time: 01/26/15  4:00 PM  Result Value Ref Range Status   MRSA by PCR NEGATIVE NEGATIVE Final    Comment:        The GeneXpert MRSA Assay (FDA approved for NASAL specimens only), is one component of a comprehensive MRSA colonization surveillance program. It is not intended to diagnose MRSA infection nor to guide or monitor treatment for MRSA infections.     Radiology Reports Ct Abdomen Pelvis Wo Contrast  01/26/2015  CLINICAL DATA:  Vomiting at 2 a.m. last night. Tachypnea and hypotension. EXAM: CT ABDOMEN AND PELVIS WITHOUT CONTRAST TECHNIQUE: Multidetector CT imaging of the abdomen and pelvis was performed following the standard protocol without IV contrast. COMPARISON:  CT abdomen and pelvis 07/02/2006. FINDINGS: There small bilateral pleural effusions. No pericardial effusion. Cardiomegaly is noted. Pacing device is in place. The lungs demonstrate basilar atelectasis. Calcific coronary artery disease is seen. Densely calcified splenic artery aneurysm measuring 1.1 cm is unchanged. There is extensive aortoiliac atherosclerosis. Abdominal aortic aneurysm measuring 4.2 cm AP by 4.2 cm transverse by 4.5 cm craniocaudal is identified which had measured 2.8 cm in diameter  on the prior exam. Mild haziness is seen about the aneurysm although there is some although there is some slight motion on the examination. The liver, spleen, adrenal glands and pancreas are unremarkable. There is some renal atrophy, more notable on the right, which is unchanged in appearance. A single tiny gallstone is noted there is no evidence of cholecystitis. Streak artifact in the pelvis from a right hip replacement somewhat limits visualization. There is diverticular disease appearing worst in the sigmoid without evidence of diverticulitis. The stomach, small bowel and appendix appear normal. No lytic or sclerotic bony lesion is identified. Left hip osteoarthritis is seen. Right hip replacement is in place. IMPRESSION: Interval enlargement of an infrarenal abdominal aortic aneurysm with mild haziness about the aneurysm which could be secondary to hemorrhage. CT angiogram of the abdomen and pelvis is recommended for further evaluation. Small bilateral pleural effusions. Tiny gallstone without evidence cholecystitis. Diverticulosis without diverticulitis. Critical Value/emergent results were called by telephone at the time of interpretation on 01/26/2015 at 11:10 am to Dr. Lonia Skinner , who verbally acknowledged these results. Electronically Signed   By: Inge Rise M.D.   On: 01/26/2015 11:10   Dg Chest 2 View  01/26/2015  CLINICAL DATA:  Acute nausea and vomiting, shortness of breath, COPD, history of CHF EXAM: CHEST  2 VIEW COMPARISON:  11/03/2014 FINDINGS: Increased thoracic kyphosis evident. Heart is enlarged without superimposed CHF or edema. No focal pneumonia, collapse or consolidation. Background COPD/ emphysema suspected. Left subclavian 2 lead pacer noted. Aorta is atherosclerotic. Bones are osteopenic. IMPRESSION: Cardiomegaly without superimposed CHF or pneumonia Background COPD/ emphysema Atherosclerosis Electronically Signed   By: Jerilynn Mages.  Shick M.D.   On: 01/26/2015 11:37   Dg  Tibia/fibula Left  01/26/2015  CLINICAL DATA:  Fall, left lower extremity pain and swelling EXAM: LEFT TIBIA AND FIBULA - 2 VIEW COMPARISON:  01/26/2015 FINDINGS: Diffuse osteopenia. Atrophy of the soft tissues. No acute osseous finding or displaced fracture. Left tibia and fibula appear intact. No definite soft tissue abnormality. Peripheral atherosclerosis noted. IMPRESSION: Osteopenia.  No acute osseous finding Peripheral atherosclerosis Electronically Signed   By: Jerilynn Mages.  Shick M.D.   On: 01/26/2015 11:42   Dg Ankle Complete Left  01/26/2015  CLINICAL DATA:  Nausea at 2 a.m. last night. The patient went to the ground and was unable to get up. She was found down this morning. Left ankle pain and swelling. Initial encounter. EXAM: LEFT ANKLE COMPLETE - 3+ VIEW COMPARISON:  None. FINDINGS: No acute bony or joint abnormality is identified. Bones appear osteopenic. Mild soft tissue swelling about the ankle is noted. IMPRESSION: Mild soft tissue swelling without underlying acute bony or joint abnormality. Osteopenia. Electronically Signed   By: Inge Rise M.D.   On: 01/26/2015 11:43   Dg Foot Complete Left  01/26/2015  CLINICAL DATA:  Left lower extremity and foot bruising and swelling. Fall. EXAM: LEFT FOOT - COMPLETE 3+ VIEW COMPARISON:  01/26/2015 FINDINGS: Bones are osteopenic. No acute osseous finding or malalignment. No definite soft tissue abnormality. Degenerative changes of the midfoot involving the navicular bone with sclerosis and joint space loss. IMPRESSION: Osteopenia and midfoot degenerative changes. No acute osseous finding. Electronically Signed   By: Jerilynn Mages.  Shick M.D.   On: 01/26/2015 11:39    CBC  Recent Labs Lab 01/22/15 1203 01/26/15 1030 01/26/15 1705 01/27/15 0551  WBC 6.3 30.4* 18.4* 14.8*  HGB 11.8* 11.6* 9.5* 9.3*  HCT 36.5 36.9 30.4* 30.5*  PLT 165 157 112* 115*  MCV 96.8 98.9 98.1 100.0  MCH 31.3 31.1 30.6 30.5  MCHC 32.3 31.4 31.3 30.5  RDW 14.0 14.8 14.5 14.9   LYMPHSABS  --  0.6*  --   --   MONOABS  --  1.2*  --   --   EOSABS  --  0.0  --   --   BASOSABS  --  0.0  --   --     Chemistries   Recent Labs Lab 01/22/15 1203 01/26/15 1030 01/26/15 1705 01/27/15 0551  NA 141 136  --  140  K 5.0 4.0  --  4.3  CL 99 97*  --  111  CO2 32* 26  --  24  GLUCOSE 85 121*  --  81  BUN 18 34*  --  25*  CREATININE 1.16* 1.80* 1.55* 1.51*  CALCIUM 8.4* 8.6*  --  7.3*  AST 50* 218*  --   --   ALT 31* 97*  --   --   ALKPHOS 70 72  --   --   BILITOT 0.6 1.1  --   --    ------------------------------------------------------------------------------------------------------------------ estimated creatinine clearance is 17.6 mL/min (by C-G formula based on Cr of 1.51). ------------------------------------------------------------------------------------------------------------------ No results for input(s): HGBA1C in the last 72 hours. ------------------------------------------------------------------------------------------------------------------ No results for input(s): CHOL, HDL, LDLCALC, TRIG, CHOLHDL, LDLDIRECT in the last 72 hours. ------------------------------------------------------------------------------------------------------------------ No results for input(s): TSH, T4TOTAL, T3FREE, THYROIDAB in the last 72 hours.  Invalid input(s): FREET3 ------------------------------------------------------------------------------------------------------------------ No results for input(s): VITAMINB12, FOLATE, FERRITIN, TIBC, IRON, RETICCTPCT in the last 72 hours.  Coagulation profile  Recent Labs Lab 01/26/15 1705  INR 1.42    No results for input(s): DDIMER in the last 72 hours.  Cardiac Enzymes  Recent Labs Lab 01/26/15 1030  TROPONINI 0.29*   ------------------------------------------------------------------------------------------------------------------ Invalid input(s): POCBNP  No results for input(s): GLUCAP in the  last 72  hours.   RAI,RIPUDEEP M.D. [facility] 01/27/2015, 11:12 AM  Pager: [phone] Between 7am to 7pm - call Pager - [phone]  After 7pm go to [email or web address] - password [email or web address]  Call night coverage person covering after 7pm

## 2015-01-27 NOTE — Progress Notes (Signed)
NP made aware of pts low BP, 56ml bolus started.

## 2015-01-27 NOTE — Progress Notes (Signed)
NP made aware that pt was found moaning and stating that she is SOB, nasal cannula increased to 5L. Sats are now 95% but still stating that she can not breath. Fine crackles noted at bases of lung fields. Pt is sitting on edge of bed to catch her breath.

## 2015-01-28 ENCOUNTER — Inpatient Hospital Stay (HOSPITAL_COMMUNITY): Payer: Medicare Other

## 2015-01-28 DIAGNOSIS — R609 Edema, unspecified: Secondary | ICD-10-CM

## 2015-01-28 DIAGNOSIS — Z515 Encounter for palliative care: Secondary | ICD-10-CM | POA: Insufficient documentation

## 2015-01-28 DIAGNOSIS — R0602 Shortness of breath: Secondary | ICD-10-CM | POA: Insufficient documentation

## 2015-01-28 LAB — CBC
HCT: 29.8 % — ABNORMAL LOW (ref 36.0–46.0)
Hemoglobin: 9.3 g/dL — ABNORMAL LOW (ref 12.0–15.0)
MCH: 31.4 pg (ref 26.0–34.0)
MCHC: 31.2 g/dL (ref 30.0–36.0)
MCV: 100.7 fL — ABNORMAL HIGH (ref 78.0–100.0)
PLATELETS: 110 10*3/uL — AB (ref 150–400)
RBC: 2.96 MIL/uL — AB (ref 3.87–5.11)
RDW: 15.3 % (ref 11.5–15.5)
WBC: 10 10*3/uL (ref 4.0–10.5)

## 2015-01-28 LAB — BASIC METABOLIC PANEL
Anion gap: 5 (ref 5–15)
BUN: 24 mg/dL — AB (ref 6–20)
CO2: 24 mmol/L (ref 22–32)
CREATININE: 1.46 mg/dL — AB (ref 0.44–1.00)
Calcium: 7.8 mg/dL — ABNORMAL LOW (ref 8.9–10.3)
Chloride: 112 mmol/L — ABNORMAL HIGH (ref 101–111)
GFR, EST AFRICAN AMERICAN: 34 mL/min — AB (ref >60)
GFR, EST NON AFRICAN AMERICAN: 29 mL/min — AB (ref >60)
Glucose, Bld: 131 mg/dL — ABNORMAL HIGH (ref 65–99)
POTASSIUM: 5.1 mmol/L (ref 3.5–5.1)
SODIUM: 141 mmol/L (ref 135–145)

## 2015-01-28 LAB — BRAIN NATRIURETIC PEPTIDE: B NATRIURETIC PEPTIDE 5: 404.1 pg/mL — AB (ref 0.0–100.0)

## 2015-01-28 MED ORDER — ALBUTEROL SULFATE (2.5 MG/3ML) 0.083% IN NEBU
2.5000 mg | INHALATION_SOLUTION | RESPIRATORY_TRACT | Status: DC | PRN
  Administered 2015-01-28 – 2015-01-29 (×4): 2.5 mg via RESPIRATORY_TRACT
  Filled 2015-01-28 (×4): qty 3

## 2015-01-28 MED ORDER — FUROSEMIDE 10 MG/ML IJ SOLN
20.0000 mg | Freq: Every day | INTRAMUSCULAR | Status: DC
  Administered 2015-01-28: 20 mg via INTRAVENOUS
  Filled 2015-01-28 (×2): qty 2

## 2015-01-28 MED ORDER — FUROSEMIDE 10 MG/ML IJ SOLN
20.0000 mg | Freq: Once | INTRAMUSCULAR | Status: AC
  Administered 2015-01-28: 20 mg via INTRAVENOUS
  Filled 2015-01-28: qty 2

## 2015-01-28 MED ORDER — ALBUTEROL SULFATE (2.5 MG/3ML) 0.083% IN NEBU
INHALATION_SOLUTION | RESPIRATORY_TRACT | Status: AC
  Filled 2015-01-28: qty 3

## 2015-01-28 NOTE — Progress Notes (Addendum)
Nutrition Brief Note  Patient identified on the Malnutrition Screening Tool Report. Palliative Care Team note 11/14 reviewed. Boost Plus oral nutrition supplement order in place. No further nutrition interventions warranted at this time.  Please consult as needed.   Arthur Holms, RD, LDN Pager #: [phone] After-Hours Pager #: [phone]

## 2015-01-28 NOTE — Progress Notes (Signed)
Daily Progress Note   Patient Name: [name]       Date: 01/28/2015 DOB: 1917-03-23  Age: 81 y.o. MRN#: [id number] Attending Physician: Cathren Harsh, MD Primary Care Physician: Sharon Seller, NP Admit Date: 01/26/2015  Reason for Consultation/Follow-up: Establishing goals of care  Subjective: [name] is a 81 y.o. female with PMH of AAA, HTN, hypothyroidism, CAD, CHF who lives alone. She fell in the middle of the night and was brought to the ED the next day after being found on the floor by her daughter. On exam she is found to have cellulitis of her left leg. She was recently admitted for the same. She is severely deconditioned and has lost a great deal of weight due to lack of appetite. She spend most of her day in bed, sleeping. She has tried an appetite stimulant in the past which was ineffective. They have requested a palliative care consult to discuss goals of care.  Interval Events: I met with Ms. Bauserman and her family today. She reports that she continues to feel short of breath. She did receive some Lasix earlier today for heart failure exacerbation and thinks this may have been helpful. At the same time, she reports continuing to feel short of breath.  Length of Stay: 2 days  Current Medications: Scheduled Meds:  . amiodarone  200 mg Oral Daily  . clopidogrel  75 mg Oral Daily  . furosemide  20 mg Intravenous Daily  . furosemide  20 mg Intravenous Once  . heparin  5,000 Units Subcutaneous 3 times per day  . lactose free nutrition  1 Container Oral Q24H  . levothyroxine  125 mcg Oral Daily  . pantoprazole  40 mg Oral Daily  . piperacillin-tazobactam (ZOSYN)  IV  2.25 g Intravenous 3 times per day  . vancomycin  500 mg Intravenous Q48H    Continuous Infusions:    PRN Meds: acetaminophen **OR** acetaminophen, alum & mag hydroxide-simeth, ondansetron **OR** ondansetron (ZOFRAN) IV   Vital Signs: BP 111/44 mmHg  Pulse 73  Temp(Src) 98.1 F (36.7 C)  (Oral)  Resp 26  Ht 5\' 2"  (1.575 m)  Wt 54.3 kg (119 lb 11.4 oz)  BMI 21.89 kg/m2  SpO2 92% SpO2: SpO2: 92 % O2 Device: O2 Device: Nasal Cannula O2 Flow Rate: O2 Flow Rate (L/min): 5 L/min  Intake/output summary:  Intake/Output Summary (Last 24 hours) at 01/28/15 1909 Last data filed at 01/28/15 1145  Gross per 24 hour  Intake      0 ml  Output    525 ml  Net   -525 ml   LBM:   Baseline Weight: Weight: 46.72 kg (103 lb) Most recent weight: Weight: 54.3 kg (119 lb 11.4 oz)  Physical Exam: General: Alert,Wearing facemask reports she is too tired to talk NT: No bruits, no goiter, no JVD Heart: Regular rate and rhythm. No murmur appreciated. Lungs:-Basilar crackles Abdomen: Soft, nontender, nondistended, positive bowel sounds.  Skin: Warm and dry Neuro: Grossly intact, nonfocal.        Additional Data Reviewed: Recent Labs     01/27/15  0551  01/28/15  0720  WBC  14.8*  10.0  HGB  9.3*  9.3*  PLT  115*  110*  NA  140  141  BUN  25*  24*  CREATININE  1.51*  1.46*     Problem List:  Patient Active Problem List   Diagnosis Date Noted  . Sepsis (HCC) 01/26/2015  . Cellulitis  of leg, left 01/26/2015  . Protein-calorie malnutrition, severe ([place]) 11/06/2014  . Pressure ulcer 11/06/2014  . Acute diastolic CHF (congestive heart failure) ([place]) 11/04/2014  . Weakness 11/04/2014  . Hypertension 11/04/2014  . Macrocytic anemia 11/04/2014  . Cardiomyopathy, ischemic 11/04/2014  . CHF exacerbation ([place]) 11/03/2014  . Body mass index (BMI) of 20.0-20.9 in adult 06/23/2014  . Hard of hearing 06/23/2014  . Rectal bleeding 07/16/2013  . Hyperlipidemia, statin intolerance 04/30/2013  . Paroxysmal atrial fibrillation ([place]) 04/30/2013  . Pacemaker - dual chamber Medtronic 2009 04/30/2013  . AAA (abdominal aortic aneurysm) ([place]) 04/30/2013  . Bilateral carotid artery stenosis 04/30/2013  . PAD (peripheral artery disease) ([place]) 04/30/2013  . Constipation 08/31/2011  .  Hypotension 08/28/2011  . S/P total hip arthroplasty 08/28/2011  . CAD (coronary artery disease) 08/28/2011  . Hypothyroid 08/28/2011  . UTI (lower urinary tract infection) 08/28/2011  . Thrombocytopenia ([place]) 08/28/2011  . SSS (sick sinus syndrome) ([place]) 10/05/2007     Palliative Care Assessment & Plan    Code Status:  DNR  Goals of Care: - Ms. Seales wants to continue pursuing therapies that are likely to add quality or time to her life. She does not want to pursue any aggressive measures and is not interested in any therapies that are not likely to add quality time to her life. - She currently lives independently, and her goal is to be strong enough to continue to do this. She does understand that she has chronic conditions that are going to continue to progress and this may not be possible.  - We also discussed completion of a MOST form prior to discharge. I was going to do this today, however with acute change in her condition we will hold off on this at this time.  Symptom Management:  Dyspnea: Patient reports onset of dyspnea last night. She received dose of Lasix this morning and she reports this seemed to help a little. She continues to have increased oxygen requirement was placed on nonrebreather just prior to my encounter. I called spoke with Dr. Tana Coast and we will plan for additional dose of Lasix at this time.  Pain: Reports well-controlled with Tylenol  Nausea: Continue Zofran as needed  Palliative Prophylaxis:   Bowel Regimen and Delirium Protocol  Psycho-social/Spiritual:  Desire for further Chaplaincy support:    Prognosis: Unable to determine Discharge Planning: [place] for rehab with Palliative care service follow-up   Care plan was discussed with patient, her daughter, Dr. Tana Coast  Thank you for allowing the Palliative Medicine Team to assist in the care of this patient.   Time In: 1710 Time Out: 1755 Total Time 45 Prolonged Time Billed no     Greater than 50%  of this time was spent counseling and coordinating care related to the above assessment and plan.   Micheline Rough, MD  01/28/2015, 7:09 PM  Please contact Palliative Medicine Team phone at [phone] for questions and concerns.

## 2015-01-28 NOTE — Progress Notes (Signed)
[facility]                                                                              Patient Demographics  [name], is a 81 y.o. female, DOB - 1917-12-02, [id number]  Admit date - 01/26/2015   Admitting Physician Calynn Ferrero Krystal Eaton, MD  Outpatient Primary MD for the patient is Lauree Chandler, NP  LOS - 2   Chief Complaint  Patient presents with  . Fall       Brief HPI  Per Dr. Wynelle [place] admit note on 11/13  [name] is a 81 y.o. female with PMH of AAA, HTN, hypothyroidism, CAD, CHF who lives alone. She fell in the middle of the night last night and although she pressed her life alert button many times, no one came to help her. Her daughter who lives next door went to check on her and found her on the floor this AM. The patient states she vomited this AM. No abdominal pain or diarrhea. She had chills this AM but no noted fever. CT of the abdomen in the ER revealed an enlarging AAA with possible extravasation of blood. Her SBP was in the 80s on arrival to the ER and she was given 2 L of IVF which has helped it to improve to low 100s. On exam she is found to have cellulitis of her left leg. She was recently admitted for the same. Further history obtained from daughters. The patient is severely deconditioned and has lost a great deal of weight due to lack of appetite. She spend most of her day in bed, sleeping. She has tried an appetite stimulant in the past which was ineffective. They have requested a palliative care consults.    Assessment & Plan   Principal Problem:  Cellulitis of leg, left/ sepsis-  presented with lactic acidosis, leukocytosis - 2 areas of cellulitis on left leg -Continue IV vancomycin and Zosyn - Blood cultures negative so far - Follow Doppler ultrasound of the left lower extremity to rule out DVT  Active Problems: Acute hypoxic respiratory failure with pulmonary edema - Overnight issues reviewed, patient was hypoxic with  shortness of breath, placed on 5 L O2 via [place] - Chest x-ray showed mild interstitial edema with bilateral pleural effusion versus pneumonia - BNP pending, patient takes Lasix at home every day, discontinued IV fluids, placed on Lasix 20 mg IV daily x 2 doses   Hypotension - Currently stable, continue to hold metoprolol, added Lasix today   Thrombocytopenia  - acute on chronic possibly secondary to acute infection  AAA - possibly rupture based on CT abdomen and pelvis - no abdominal pain at this time- BP improved as well - DNR- palliative medicine consulted per family's wishes - EDP had consulted vascular surgery, Dr. Bridgett Larsson who had recommended palliative approach and patient/family did not want any surgery or interventions - Palliative medicine following  Acute on CKD 3 -Improving   Mildly elevated CK and Troponin with h/o CAD - due to sepsis and laying on the floor- follow- cont Plavix   Paroxysmal atrial fibrillation  - cont Amiodarone- rate controlled - not  a good candidate for anticoagulation - on Plavix   Pacemaker - dual chamber Medtronic 2009  Hypothyroid continue Synthroid  Mechanical fall  - PT evaluation pending , will likely need skilled nursing facility  Code Status:  DNR/DNI  Family Communication: Discussed in detail with the patient, all imaging results, lab results explained to the patient and her daughter, Pamala Hurry on the phone    Disposition Plan:  will likely need skilled nursing facility  Time Spent in mins: 25  Procedures  CT abdomen and pelvis   Consults   palliative medicine  DVT Prophylaxis  heparin subcutaneous   Medications  Scheduled Meds: . amiodarone  200 mg Oral Daily  . clopidogrel  75 mg Oral Daily  . furosemide  20 mg Intravenous Daily  . heparin  5,000 Units Subcutaneous 3 times per day  . lactose free nutrition  1 Container Oral Q24H  . levothyroxine  125 mcg Oral Daily  . pantoprazole  40 mg Oral Daily  .  piperacillin-tazobactam (ZOSYN)  IV  2.25 g Intravenous 3 times per day  . vancomycin  500 mg Intravenous Q48H   Continuous Infusions:   PRN Meds:.acetaminophen **OR** acetaminophen, alum & mag hydroxide-simeth, ondansetron **OR** ondansetron (ZOFRAN) IV   Antibiotics   Anti-infectives    Start     Dose/Rate Route Frequency Ordered Stop   01/28/15 1400  vancomycin (VANCOCIN) 500 mg in sodium chloride 0.9 % 100 mL IVPB     500 mg 100 mL/hr over 60 Minutes Intravenous Every 48 hours 01/26/15 1305     01/26/15 2200  piperacillin-tazobactam (ZOSYN) IVPB 2.25 g     2.25 g 100 mL/hr over 30 Minutes Intravenous 3 times per day 01/26/15 1305     01/26/15 1315  piperacillin-tazobactam (ZOSYN) IVPB 3.375 g     3.375 g 100 mL/hr over 30 Minutes Intravenous  Once 01/26/15 1305 01/26/15 1420   01/26/15 1315  vancomycin (VANCOCIN) IVPB 1000 mg/200 mL premix     1,000 mg 200 mL/hr over 60 Minutes Intravenous  Once 01/26/15 1305 01/26/15 1531        Subjective:   [name] was seen and examined today. Feeling somewhat better this morning, overnight issues reviewed. BP stable. Denies any chest pain, abdominal pain, N/V/D/C, new weakness, numbess, tingling. No fevers or chills.  Objective:   Blood pressure 129/68, pulse 73, temperature 97.9 F (36.6 C), temperature source Oral, resp. rate 19, height 5\' 2"  (1.575 m), weight 54.3 kg (119 lb 11.4 oz), SpO2 99 %.  Wt Readings from Last 3 Encounters:  01/26/15 54.3 kg (119 lb 11.4 oz)  01/22/15 47.854 kg (105 lb 8 oz)  12/09/14 48.444 kg (106 lb 12.8 oz)     Intake/Output Summary (Last 24 hours) at 01/28/15 1053 Last data filed at 01/28/15 [id number]  Gross per 24 hour  Intake   1040 ml  Output    150 ml  Net    890 ml    Exam  General: Alert and oriented x 3, NAD  HEENT:  PERRLA, EOMI, Anicteric Sclera, mucous membranes moist.   Neck: Supple, no JVD, no masses  CVS: S1 S2 auscultated,2/6 murmur at the apex  Respiratory: Bibasilar  crackles  Abdomen: Soft, nontender, nondistended, + bowel sounds  Ext: no c/c Erythema on the left foot and leg, tender and warm to touch  Neuro: no new deficits   Skin: No rashes  Psych: Normal affect and demeanor, alert and oriented x3    Data Review  Micro Results Recent Results (from the past 240 hour(s))  Urine culture     Status: None (Preliminary result)   Collection Time: 01/26/15 12:30 PM  Result Value Ref Range Status   Specimen Description URINE, CATHETERIZED  Final   Special Requests NONE  Final   Culture   Final    TOO YOUNG TO READ Performed at [facility]    Report Status PENDING  Incomplete  Blood culture (routine x 2)     Status: None (Preliminary result)   Collection Time: 01/26/15  1:05 PM  Result Value Ref Range Status   Specimen Description BLOOD LEFT ANTECUBITAL  Final   Special Requests BOTTLES DRAWN AEROBIC AND ANAEROBIC 5CC EACH  Final   Culture   Final    NO GROWTH < 24 HOURS Performed at [facility]    Report Status PENDING  Incomplete  Blood culture (routine x 2)     Status: None (Preliminary result)   Collection Time: 01/26/15  1:50 PM  Result Value Ref Range Status   Specimen Description BLOOD LEFT WRIST  Final   Special Requests IN PEDIATRIC BOTTLE 3CC  Final   Culture   Final    NO GROWTH < 24 HOURS Performed at [facility]    Report Status PENDING  Incomplete  MRSA PCR Screening     Status: None   Collection Time: 01/26/15  4:00 PM  Result Value Ref Range Status   MRSA by PCR NEGATIVE NEGATIVE Final    Comment:        The GeneXpert MRSA Assay (FDA approved for NASAL specimens only), is one component of a comprehensive MRSA colonization surveillance program. It is not intended to diagnose MRSA infection nor to guide or monitor treatment for MRSA infections.     Radiology Reports Ct Abdomen Pelvis Wo Contrast  01/26/2015  CLINICAL DATA:  Vomiting at 2 a.m. last night. Tachypnea and hypotension.  EXAM: CT ABDOMEN AND PELVIS WITHOUT CONTRAST TECHNIQUE: Multidetector CT imaging of the abdomen and pelvis was performed following the standard protocol without IV contrast. COMPARISON:  CT abdomen and pelvis 07/02/2006. FINDINGS: There small bilateral pleural effusions. No pericardial effusion. Cardiomegaly is noted. Pacing device is in place. The lungs demonstrate basilar atelectasis. Calcific coronary artery disease is seen. Densely calcified splenic artery aneurysm measuring 1.1 cm is unchanged. There is extensive aortoiliac atherosclerosis. Abdominal aortic aneurysm measuring 4.2 cm AP by 4.2 cm transverse by 4.5 cm craniocaudal is identified which had measured 2.8 cm in diameter on the prior exam. Mild haziness is seen about the aneurysm although there is some although there is some slight motion on the examination. The liver, spleen, adrenal glands and pancreas are unremarkable. There is some renal atrophy, more notable on the right, which is unchanged in appearance. A single tiny gallstone is noted there is no evidence of cholecystitis. Streak artifact in the pelvis from a right hip replacement somewhat limits visualization. There is diverticular disease appearing worst in the sigmoid without evidence of diverticulitis. The stomach, small bowel and appendix appear normal. No lytic or sclerotic bony lesion is identified. Left hip osteoarthritis is seen. Right hip replacement is in place. IMPRESSION: Interval enlargement of an infrarenal abdominal aortic aneurysm with mild haziness about the aneurysm which could be secondary to hemorrhage. CT angiogram of the abdomen and pelvis is recommended for further evaluation. Small bilateral pleural effusions. Tiny gallstone without evidence cholecystitis. Diverticulosis without diverticulitis. Critical Value/emergent results were called by telephone at the time of  interpretation on 01/26/2015 at 11:10 am to Dr. Lonia Skinner , who verbally acknowledged these results.  Electronically Signed   By: Inge Rise M.D.   On: 01/26/2015 11:10   Dg Chest 2 View  01/26/2015  CLINICAL DATA:  Acute nausea and vomiting, shortness of breath, COPD, history of CHF EXAM: CHEST  2 VIEW COMPARISON:  11/03/2014 FINDINGS: Increased thoracic kyphosis evident. Heart is enlarged without superimposed CHF or edema. No focal pneumonia, collapse or consolidation. Background COPD/ emphysema suspected. Left subclavian 2 lead pacer noted. Aorta is atherosclerotic. Bones are osteopenic. IMPRESSION: Cardiomegaly without superimposed CHF or pneumonia Background COPD/ emphysema Atherosclerosis Electronically Signed   By: Jerilynn Mages.  Shick M.D.   On: 01/26/2015 11:37   Dg Tibia/fibula Left  01/26/2015  CLINICAL DATA:  Fall, left lower extremity pain and swelling EXAM: LEFT TIBIA AND FIBULA - 2 VIEW COMPARISON:  01/26/2015 FINDINGS: Diffuse osteopenia. Atrophy of the soft tissues. No acute osseous finding or displaced fracture. Left tibia and fibula appear intact. No definite soft tissue abnormality. Peripheral atherosclerosis noted. IMPRESSION: Osteopenia.  No acute osseous finding Peripheral atherosclerosis Electronically Signed   By: Jerilynn Mages.  Shick M.D.   On: 01/26/2015 11:42   Dg Ankle Complete Left  01/26/2015  CLINICAL DATA:  Nausea at 2 a.m. last night. The patient went to the ground and was unable to get up. She was found down this morning. Left ankle pain and swelling. Initial encounter. EXAM: LEFT ANKLE COMPLETE - 3+ VIEW COMPARISON:  None. FINDINGS: No acute bony or joint abnormality is identified. Bones appear osteopenic. Mild soft tissue swelling about the ankle is noted. IMPRESSION: Mild soft tissue swelling without underlying acute bony or joint abnormality. Osteopenia. Electronically Signed   By: Inge Rise M.D.   On: 01/26/2015 11:43   Dg Chest Port 1 View  01/28/2015  CLINICAL DATA:  Acute onset of shortness of breath. Bibasilar crackles. Initial encounter. EXAM: PORTABLE CHEST 1 VIEW  COMPARISON:  Chest radiograph performed 01/26/2015 FINDINGS: Lung expansion is mildly decreased. Small bilateral pleural effusions are seen. Bibasilar airspace opacities may reflect mild interstitial edema or possibly pneumonia. No pneumothorax is identified. The cardiomediastinal silhouette is mildly enlarged. A pacemaker is noted overlying the left chest wall, with leads ending overlying the right atrium and right ventricle. No acute osseous abnormalities are seen. IMPRESSION: Lung expansion mildly decreased. Small bilateral pleural effusions seen. Bibasilar airspace opacities may reflect mild interstitial edema or possibly pneumonia. Mild cardiomegaly noted. Electronically Signed   By: Garald Balding M.D.   On: 01/28/2015 00:29   Dg Foot Complete Left  01/26/2015  CLINICAL DATA:  Left lower extremity and foot bruising and swelling. Fall. EXAM: LEFT FOOT - COMPLETE 3+ VIEW COMPARISON:  01/26/2015 FINDINGS: Bones are osteopenic. No acute osseous finding or malalignment. No definite soft tissue abnormality. Degenerative changes of the midfoot involving the navicular bone with sclerosis and joint space loss. IMPRESSION: Osteopenia and midfoot degenerative changes. No acute osseous finding. Electronically Signed   By: Jerilynn Mages.  Shick M.D.   On: 01/26/2015 11:39    CBC  Recent Labs Lab 01/22/15 1203 01/26/15 1030 01/26/15 1705 01/27/15 0551 01/28/15 0720  WBC 6.3 30.4* 18.4* 14.8* 10.0  HGB 11.8* 11.6* 9.5* 9.3* 9.3*  HCT 36.5 36.9 30.4* 30.5* 29.8*  PLT 165 157 112* 115* 110*  MCV 96.8 98.9 98.1 100.0 100.7*  MCH 31.3 31.1 30.6 30.5 31.4  MCHC 32.3 31.4 31.3 30.5 31.2  RDW 14.0 14.8 14.5 14.9 15.3  LYMPHSABS  --  0.6*  --   --   --  MONOABS  --  1.2*  --   --   --   EOSABS  --  0.0  --   --   --   BASOSABS  --  0.0  --   --   --     Chemistries   Recent Labs Lab 01/22/15 1203 01/26/15 1030 01/26/15 1705 01/27/15 0551 01/28/15 0720  NA 141 136  --  140 141  K 5.0 4.0  --  4.3 5.1  CL  99 97*  --  111 112*  CO2 32* 26  --  24 24  GLUCOSE 85 121*  --  81 131*  BUN 18 34*  --  25* 24*  CREATININE 1.16* 1.80* 1.55* 1.51* 1.46*  CALCIUM 8.4* 8.6*  --  7.3* 7.8*  AST 50* 218*  --   --   --   ALT 31* 97*  --   --   --   ALKPHOS 70 72  --   --   --   BILITOT 0.6 1.1  --   --   --    ------------------------------------------------------------------------------------------------------------------ estimated creatinine clearance is 18.2 mL/min (by C-G formula based on Cr of 1.46). ------------------------------------------------------------------------------------------------------------------ No results for input(s): HGBA1C in the last 72 hours. ------------------------------------------------------------------------------------------------------------------ No results for input(s): CHOL, HDL, LDLCALC, TRIG, CHOLHDL, LDLDIRECT in the last 72 hours. ------------------------------------------------------------------------------------------------------------------ No results for input(s): TSH, T4TOTAL, T3FREE, THYROIDAB in the last 72 hours.  Invalid input(s): FREET3 ------------------------------------------------------------------------------------------------------------------ No results for input(s): VITAMINB12, FOLATE, FERRITIN, TIBC, IRON, RETICCTPCT in the last 72 hours.  Coagulation profile  Recent Labs Lab 01/26/15 1705  INR 1.42    No results for input(s): DDIMER in the last 72 hours.  Cardiac Enzymes  Recent Labs Lab 01/26/15 1030  TROPONINI 0.29*   ------------------------------------------------------------------------------------------------------------------ Invalid input(s): POCBNP  No results for input(s): GLUCAP in the last 72 hours.   Elienai Gailey M.D. [facility] 01/28/2015, 10:53 AM  Pager: [phone] Between 7am to 7pm - call Pager - [phone]  After 7pm go to [email or web address] - password [email or web address]  Call night coverage person covering  after 7pm

## 2015-01-28 NOTE — Progress Notes (Signed)
MD made aware of urine output of 150cc last night.

## 2015-01-28 NOTE — Progress Notes (Signed)
Preliminary results by tech - Left Lower Ext. Venous Duplex Completed. Negative for deep and superficial vein thrombosis in the left lower extremity. Koraima Albertsen, BS, RDMS, RVT  

## 2015-01-29 DIAGNOSIS — A419 Sepsis, unspecified organism: Principal | ICD-10-CM

## 2015-01-29 DIAGNOSIS — J810 Acute pulmonary edema: Secondary | ICD-10-CM

## 2015-01-29 DIAGNOSIS — L03116 Cellulitis of left lower limb: Secondary | ICD-10-CM

## 2015-01-29 DIAGNOSIS — J9601 Acute respiratory failure with hypoxia: Secondary | ICD-10-CM

## 2015-01-29 LAB — URINE CULTURE

## 2015-01-29 LAB — BASIC METABOLIC PANEL
Anion gap: 12 (ref 5–15)
BUN: 18 mg/dL (ref 6–20)
CALCIUM: 8 mg/dL — AB (ref 8.9–10.3)
CO2: 22 mmol/L (ref 22–32)
CREATININE: 1.58 mg/dL — AB (ref 0.44–1.00)
Chloride: 107 mmol/L (ref 101–111)
GFR, EST AFRICAN AMERICAN: 31 mL/min — AB (ref >60)
GFR, EST NON AFRICAN AMERICAN: 27 mL/min — AB (ref >60)
GLUCOSE: 100 mg/dL — AB (ref 65–99)
Potassium: 3.6 mmol/L (ref 3.5–5.1)
Sodium: 141 mmol/L (ref 135–145)

## 2015-01-29 LAB — CBC
HEMATOCRIT: 32.3 % — AB (ref 36.0–46.0)
Hemoglobin: 9.8 g/dL — ABNORMAL LOW (ref 12.0–15.0)
MCH: 30.5 pg (ref 26.0–34.0)
MCHC: 30.3 g/dL (ref 30.0–36.0)
MCV: 100.6 fL — AB (ref 78.0–100.0)
PLATELETS: 137 10*3/uL — AB (ref 150–400)
RBC: 3.21 MIL/uL — ABNORMAL LOW (ref 3.87–5.11)
RDW: 14.9 % (ref 11.5–15.5)
WBC: 11.9 10*3/uL — ABNORMAL HIGH (ref 4.0–10.5)

## 2015-01-29 MED ORDER — POLYETHYLENE GLYCOL 3350 17 G PO PACK
17.0000 g | PACK | Freq: Every day | ORAL | Status: DC | PRN
  Administered 2015-01-29: 17 g via ORAL
  Filled 2015-01-29: qty 1

## 2015-01-29 MED ORDER — FUROSEMIDE 10 MG/ML IJ SOLN
20.0000 mg | Freq: Once | INTRAMUSCULAR | Status: AC
  Administered 2015-01-29: 20 mg via INTRAVENOUS

## 2015-01-29 MED ORDER — FUROSEMIDE 10 MG/ML IJ SOLN
20.0000 mg | Freq: Three times a day (TID) | INTRAMUSCULAR | Status: DC
  Administered 2015-01-29 – 2015-01-30 (×4): 20 mg via INTRAVENOUS
  Filled 2015-01-29 (×4): qty 2

## 2015-01-29 MED ORDER — DOCUSATE SODIUM 100 MG PO CAPS
100.0000 mg | ORAL_CAPSULE | Freq: Two times a day (BID) | ORAL | Status: DC
  Administered 2015-01-29 – 2015-01-30 (×3): 100 mg via ORAL
  Filled 2015-01-29 (×3): qty 1

## 2015-01-29 NOTE — Progress Notes (Signed)
RN was called into room by family at bedside with the concern that the patient wasn't "breathing right". Paient had increased work of breathing, shortness of breath, and fine crackles in the right and left upper lobes. Respiratory administered PRN breathing treatment and switched patient to a venturi mask on a FiO2 of 55%. Baltazar Najjar, NP notified and ordered 20mg  lasix injection. Lasix was administered at [place]. Will continue to monitor.    Hart Rochester, RN, BSN

## 2015-01-29 NOTE — Progress Notes (Signed)
Pt has scattered exp wheezes, bibasilar crackles and 02 sats in the low 90's but not wanting to wear 100% nonrebreather. Given PRN Albuterol resp treatment changed over to 5 l n/c after treatment now says she's breathing better . Daughter at bedside and stayed all night - very helpful with her mother's care

## 2015-01-29 NOTE — Progress Notes (Addendum)
ANTIBIOTIC CONSULT NOTE - follow up  Pharmacy Consult for vancomycin and zosyn Indication: rule out sepsis  Allergies  Allergen Reactions  . Nubain [Nalbuphine Hcl] Anaphylaxis  . Mirtazapine     "Weakness in legs"  . Statins     Leg weakness  . Iodine Rash    Patient Measurements: Height: 5\' 2"  (157.5 cm) Weight: 119 lb 11.4 oz (54.3 kg) IBW/kg (Calculated) : 50.1   Vital Signs: Temp: 97.6 F (36.4 C) (11/16 1303) Temp Source: Oral (11/16 1303) BP: 117/40 mmHg (11/16 0835) Pulse Rate: 70 (11/16 0835) Intake/Output from previous day: 11/15 0701 - 11/16 0700 In: 350 [IV Piggyback:350] Out: 1475 [id number] Intake/Output from this shift: Total I/O In: 200 [P.O.:200] Out: 1200 [Urine:1200]  Labs:  Recent Labs  01/27/15 0551 01/28/15 0720 01/29/15 0455  WBC 14.8* 10.0 11.9*  HGB 9.3* 9.3* 9.8*  PLT 115* 110* 137*  CREATININE 1.51* 1.46* 1.58*   Estimated Creatinine Clearance: 16.8 mL/min (by C-G formula based on Cr of 1.58). No results for input(s): VANCOTROUGH, VANCOPEAK, VANCORANDOM, GENTTROUGH, GENTPEAK, GENTRANDOM, TOBRATROUGH, TOBRAPEAK, TOBRARND, AMIKACINPEAK, AMIKACINTROU, AMIKACIN in the last 72 hours.   Microbiology: Recent Results (from the past 720 hour(s))  Urine culture     Status: None   Collection Time: 01/26/15 12:30 PM  Result Value Ref Range Status   Specimen Description URINE, CATHETERIZED  Final   Special Requests NONE  Final   Culture   Final    >=100,000 COLONIES/mL KLEBSIELLA PNEUMONIAE Performed at [facility]    Report Status 01/29/2015 FINAL  Final   Organism ID, Bacteria KLEBSIELLA PNEUMONIAE  Final      Susceptibility   Klebsiella pneumoniae - MIC*    AMPICILLIN >=32 RESISTANT Resistant     CEFAZOLIN <=4 SENSITIVE Sensitive     CEFTRIAXONE <=1 SENSITIVE Sensitive     CIPROFLOXACIN <=0.25 SENSITIVE Sensitive     GENTAMICIN <=1 SENSITIVE Sensitive     IMIPENEM <=0.25 SENSITIVE Sensitive     NITROFURANTOIN <=16  SENSITIVE Sensitive     TRIMETH/SULFA <=20 SENSITIVE Sensitive     AMPICILLIN/SULBACTAM 4 SENSITIVE Sensitive     PIP/TAZO <=4 SENSITIVE Sensitive     * >=100,000 COLONIES/mL KLEBSIELLA PNEUMONIAE  Blood culture (routine x 2)     Status: None (Preliminary result)   Collection Time: 01/26/15  1:05 PM  Result Value Ref Range Status   Specimen Description BLOOD LEFT ANTECUBITAL  Final   Special Requests BOTTLES DRAWN AEROBIC AND ANAEROBIC 5CC EACH  Final   Culture   Final    NO GROWTH 2 DAYS Performed at [facility]    Report Status PENDING  Incomplete  Blood culture (routine x 2)     Status: None (Preliminary result)   Collection Time: 01/26/15  1:50 PM  Result Value Ref Range Status   Specimen Description BLOOD LEFT WRIST  Final   Special Requests IN PEDIATRIC BOTTLE 3CC  Final   Culture   Final    NO GROWTH 2 DAYS Performed at [facility]    Report Status PENDING  Incomplete  MRSA PCR Screening     Status: None   Collection Time: 01/26/15  4:00 PM  Result Value Ref Range Status   MRSA by PCR NEGATIVE NEGATIVE Final    Comment:        The GeneXpert MRSA Assay (FDA approved for NASAL specimens only), is one component of a comprehensive MRSA colonization surveillance program. It is not intended to diagnose MRSA infection nor to  guide or monitor treatment for MRSA infections.     Medical History: Past Medical History  Diagnosis Date  . GI bleed 2007  . Hypertension   . TIA (transient ischemic attack)   . Hypothyroidism   . Abdominal aortic aneurysm ([place])   . CAD (coronary artery disease)   . SSS (sick sinus syndrome) ([place]) 10/05/2007    Medtronic Adapta  . Myocardial infarction ([place])   . Atrial fibrillation ([place])   . COPD (chronic obstructive pulmonary disease) ([place])   . Asthma     hx  . Anemia     hx  . Blood transfusion   . UTI (lower urinary tract infection)     hx  . Renal failure, chronic   . H/O hiatal hernia   . GERD (gastroesophageal  reflux disease)   . Stroke ([place])   . Osteoporosis   . Blood transfusion without reported diagnosis   . Pacemaker    Assessment: 81 yo F transferred from  [facility]  On  vancomycin and zosyn per pharmacy for sepsis/cellutlitis/UTI.  Wt 46.7 > 54.3 kg, afeb, WBC 10>11.9 LA 2.4,  hypotensive>improved. CXR neg for PNA. 11/16 L leg cellulitis, doppler neg DVT, erythema improved, creat 1.58, creat cl  ~ 17 ml/min. Ucx with klebsiella.   vanc 11/13>> Zosyn 11/13>>  11/13 BCx2>>ngtd 11/13 Ucx>> >100k klebsiella pneumo, sens all x amp- unsure if she had symptoms of UTI  Goal of Therapy:  Vancomycin trough level 15-20 mcg/ml  Plan: -  Continue zosyn 2.25 IV q8h - continue vancomycin 500 mg IV q48h - f/u renal fxn, wbc, temp, culture data, clinical progress - vanc levels as needed  Eudelia Bunch, Pharm.D. [id number] 01/29/2015 1:38 PM

## 2015-01-29 NOTE — Care Management Important Message (Signed)
Important Message  Patient Details  Name: [name] MRN: [id number] Date of Birth: 17-Dec-1917   Medicare Important Message Given:  Yes    [name] [place]ena 01/29/2015, 3:35 PM

## 2015-01-29 NOTE — Evaluation (Signed)
Physical Therapy Evaluation Patient Details Name: [name] MRN: [id number] DOB: [birth year] Today's Date: 01/29/2015   History of Present Illness  [name] is a 81 y.o. female with PMH of AAA, HTN, hypothyroidism, CAD, CHF who lives alone. She fell in the middle of the night and was brought to the ED the next day after being found on the floor by her daughter. On exam she is found to have cellulitis of her left leg. She was recently admitted for the same. She is severely deconditioned and has lost a great deal of weight due to lack of appetite. She spend most of her day in bed, sleeping. She has tried an appetite stimulant in the past which was ineffective. They have requested a palliative care consult to discuss goals of care.  Clinical Impression  Pt admitted with above diagnosis. Pt currently with functional limitations due to the deficits listed below (see PT Problem List). Pt was able to get to chair with +2 assist.  Able to help with mod assist.  Pt very weak and deconditioned and will most likely need SNF at d/c as she lives alone. Will follow acutely.   Pt will benefit from skilled PT to increase their independence and safety with mobility to allow discharge to the venue listed below.    Follow Up Recommendations SNF;Supervision/Assistance - 24 hour    Equipment Recommendations  None recommended by PT    Recommendations for Other Services       Precautions / Restrictions Precautions Precautions: Fall Restrictions Weight Bearing Restrictions: No      Mobility  Bed Mobility Overal bed mobility: Needs Assistance;+2 for physical assistance Bed Mobility: Supine to Sit     Supine to sit: Mod assist;+2 for physical assistance;HOB elevated     General bed mobility comments: Assist for LEs and elevation of trunk  Transfers Overall transfer level: Needs assistance Equipment used: 2 person hand held assist Transfers: Sit to/from [employer] Sit to  Stand: Mod assist;+2 physical assistance   Squat pivot transfers: Mod assist;+2 physical assistance     General transfer comment: Pt performed squat pivot transfer with mod asssist as she could not stand all the way up due to left LE pain. Needed asssist for anterior lean with use of gait belt.   Ambulation/Gait                Stairs            Wheelchair Mobility    Modified Rankin (Stroke Patients Only)       Balance Overall balance assessment: Needs assistance;History of Falls Sitting-balance support: No upper extremity supported;Feet supported Sitting balance-Leahy Scale: Fair     Standing balance support: Bilateral upper extremity supported;During functional activity Standing balance-Leahy Scale: Zero Standing balance comment: Needed mod assist of 2 and could not acheive full stand.                             Pertinent Vitals/Pain Pain Assessment: Faces Faces Pain Scale: Hurts whole lot Pain Location: left foot/leg Pain Descriptors / Indicators: Aching;Grimacing;Guarding;Sore Pain Intervention(s): Limited activity within patient's tolerance;Monitored during session;Repositioned  O2 on 5LO2 with sats 88-94% in bed.  Once in chair, sats 94% and >.  Pt more alert in chair as well.  BP 99/40 intiially and then 109/69 on departure.      Home Living Family/patient expects to be discharged to:: Private residence Living Arrangements: Alone Available Help  at Discharge: Family;Available PRN/intermittently Type of Home: Mobile home Home Access: Stairs to enter Entrance Stairs-Rails: Right;Left;Can reach both Entrance Stairs-Number of Steps: 4 Home Layout: One level Home Equipment: Walker - 4 wheels;Cane - single point Additional Comments: Pt has one daughter who lives in the same yard as her and works during the day, and another daughter lives 69 minutes away.     Prior Function Level of Independence: Independent with assistive device(s)          Comments: Used the rollator all the time, sponge bathes     Hand Dominance        Extremity/Trunk Assessment   Upper Extremity Assessment: Defer to OT evaluation           Lower Extremity Assessment: Generalized weakness      Cervical / Trunk Assessment: Kyphotic  Communication   Communication: HOH  Cognition Arousal/Alertness: Awake/alert Behavior During Therapy: Flat affect Overall Cognitive Status: Within Functional Limits for tasks assessed                      General Comments General comments (skin integrity, edema, etc.): edema and redness left LE.    Exercises        Assessment/Plan    PT Assessment Patient needs continued PT services  PT Diagnosis Generalized weakness;Acute pain   PT Problem List Decreased activity tolerance;Decreased balance;Decreased mobility;Decreased knowledge of use of DME;Decreased safety awareness;Decreased knowledge of precautions;Pain;Decreased skin integrity  PT Treatment Interventions DME instruction;Gait training;Functional mobility training;Therapeutic activities;Therapeutic exercise;Balance training;Patient/family education   PT Goals (Current goals can be found in the Care Plan section) Acute Rehab PT Goals Patient Stated Goal: to get better PT Goal Formulation: With patient Time For Goal Achievement: 02/12/15 Potential to Achieve Goals: Good    Frequency Min 2X/week   Barriers to discharge Decreased caregiver support      Co-evaluation               End of Session Equipment Utilized During Treatment: Gait belt;Oxygen Activity Tolerance: Patient limited by fatigue;Patient limited by pain Patient left: in chair;with call bell/phone within reach;with family/visitor present Nurse Communication: Mobility status;Need for lift equipment         Time: [id number] PT Time Calculation (min) (ACUTE ONLY): 20 min   Charges:   PT Evaluation $Initial PT Evaluation Tier I: 1 Procedure     PT G CodesDenice Paradise Feb 22, 2015, 11:34 AM  [facility] [phone] (pager)

## 2015-01-29 NOTE — Progress Notes (Addendum)
[facility] PROGRESS NOTE  [name] [id number] DOB: 06/17/1917 DOA: 01/26/2015  PCP: Lauree Chandler, NP  Brief HPI: Patient is a 81 year old Caucasian female with past medical history of AAA, hypertension, hypothyroidism, who presented after a fall at home. Patient underwent CT scan of the abdomen in the ER which revealed an enlarging AAA with possible extravasation of blood. Her blood pressure was in the 80s on arrival. She was given IV fluids. She was thought to have cellulitis of her left lower extremity. She was hospitalized for further management.  Past medical history:  Past Medical History  Diagnosis Date  . GI bleed 2007  . Hypertension   . TIA (transient ischemic attack)   . Hypothyroidism   . Abdominal aortic aneurysm ([place])   . CAD (coronary artery disease)   . SSS (sick sinus syndrome) ([place]) 10/05/2007    Medtronic Adapta  . Myocardial infarction ([place])   . Atrial fibrillation ([place])   . COPD (chronic obstructive pulmonary disease) ([place])   . Asthma     hx  . Anemia     hx  . Blood transfusion   . UTI (lower urinary tract infection)     hx  . Renal failure, chronic   . H/O hiatal hernia   . GERD (gastroesophageal reflux disease)   . Stroke ([place])   . Osteoporosis   . Blood transfusion without reported diagnosis   . Pacemaker     Consultants: Palliative medicine  Procedures: None  Antibiotics: Vancomycin and Zosyn  Subjective: Patient lying on the bed with daughter at bedside. She is on a nonrebreather. Not very responsive. Denies any pain. She was apparently quite short of breath overnight.  Objective: Vital Signs  Filed Vitals:   01/29/15 0357 01/29/15 0500 01/29/15 0714 01/29/15 0835  BP: 124/51 112/49  117/40  Pulse: 74 73 70 70  Temp: 100.1 F (37.8 C) 98.9 F (37.2 C)  98.4 F (36.9 C)  TempSrc: Oral Oral  Axillary  Resp: 31 20 23 19   Height:      Weight:      SpO2: 92% 90% 98% 93%    Intake/Output Summary (Last 24  hours) at 01/29/15 1157 Last data filed at 01/29/15 0500  Gross per 24 hour  Intake    350 ml  Output   1100 ml  Net   -750 ml   Filed Weights   01/26/15 1022 01/26/15 1600  Weight: 46.72 kg (103 lb) 54.3 kg (119 lb 11.4 oz)    General appearance: distracted, no distress and Slightly lethargic Head: Normocephalic, without obvious abnormality, atraumatic Resp: Diminished air entry at the bases with crackles bilaterally. No wheezing. No rhonchi. Cardio: regular rate and rhythm, S1, S2 normal, no murmur, click, rub or gallop GI: soft, non-tender; bowel sounds normal; no masses,  no organomegaly Extremities: Erythema of the left lower extremity. Slightly warm to touch. Neurologic: Somnolent but arousable. Moving all her extremities.  Lab Results:  Basic Metabolic Panel:  Recent Labs Lab 01/22/15 1203 01/26/15 1030 01/26/15 1705 01/27/15 0551 01/28/15 0720 01/29/15 0455  NA 141 136  --  140 141 141  K 5.0 4.0  --  4.3 5.1 3.6  CL 99 97*  --  111 112* 107  CO2 32* 26  --  24 24 22   GLUCOSE 85 121*  --  81 131* 100*  BUN 18 34*  --  25* 24* 18  CREATININE 1.16* 1.80* 1.55* 1.51* 1.46* 1.58*  CALCIUM 8.4* 8.6*  --  7.3* 7.8* 8.0*   Liver Function Tests:  Recent Labs Lab 01/22/15 1203 01/26/15 1030  AST 50* 218*  ALT 31* 97*  ALKPHOS 70 72  BILITOT 0.6 1.1  PROT 5.7* 6.1*  ALBUMIN 3.0* 2.9*    Recent Labs Lab 01/26/15 1030  LIPASE 26   CBC:  Recent Labs Lab 01/26/15 1030 01/26/15 1705 01/27/15 0551 01/28/15 0720 01/29/15 0455  WBC 30.4* 18.4* 14.8* 10.0 11.9*  NEUTROABS 28.6*  --   --   --   --   HGB 11.6* 9.5* 9.3* 9.3* 9.8*  HCT 36.9 30.4* 30.5* 29.8* 32.3*  MCV 98.9 98.1 100.0 100.7* 100.6*  PLT 157 112* 115* 110* 137*   Cardiac Enzymes:  Recent Labs Lab 01/26/15 1030  CKTOTAL 287*  TROPONINI 0.29*   BNP (last 3 results)  Recent Labs  11/03/14 1807 01/28/15 0935  BNP 377.4* 404.1*     Recent Results (from the past 240 hour(s))    Urine culture     Status: None   Collection Time: 01/26/15 12:30 PM  Result Value Ref Range Status   Specimen Description URINE, CATHETERIZED  Final   Special Requests NONE  Final   Culture   Final    >=100,000 COLONIES/mL KLEBSIELLA PNEUMONIAE Performed at [facility]    Report Status 01/29/2015 FINAL  Final   Organism ID, Bacteria KLEBSIELLA PNEUMONIAE  Final      Susceptibility   Klebsiella pneumoniae - MIC*    AMPICILLIN >=32 RESISTANT Resistant     CEFAZOLIN <=4 SENSITIVE Sensitive     CEFTRIAXONE <=1 SENSITIVE Sensitive     CIPROFLOXACIN <=0.25 SENSITIVE Sensitive     GENTAMICIN <=1 SENSITIVE Sensitive     IMIPENEM <=0.25 SENSITIVE Sensitive     NITROFURANTOIN <=16 SENSITIVE Sensitive     TRIMETH/SULFA <=20 SENSITIVE Sensitive     AMPICILLIN/SULBACTAM 4 SENSITIVE Sensitive     PIP/TAZO <=4 SENSITIVE Sensitive     * >=100,000 COLONIES/mL KLEBSIELLA PNEUMONIAE  Blood culture (routine x 2)     Status: None (Preliminary result)   Collection Time: 01/26/15  1:05 PM  Result Value Ref Range Status   Specimen Description BLOOD LEFT ANTECUBITAL  Final   Special Requests BOTTLES DRAWN AEROBIC AND ANAEROBIC 5CC EACH  Final   Culture   Final    NO GROWTH 2 DAYS Performed at [facility]    Report Status PENDING  Incomplete  Blood culture (routine x 2)     Status: None (Preliminary result)   Collection Time: 01/26/15  1:50 PM  Result Value Ref Range Status   Specimen Description BLOOD LEFT WRIST  Final   Special Requests IN PEDIATRIC BOTTLE 3CC  Final   Culture   Final    NO GROWTH 2 DAYS Performed at [facility]    Report Status PENDING  Incomplete  MRSA PCR Screening     Status: None   Collection Time: 01/26/15  4:00 PM  Result Value Ref Range Status   MRSA by PCR NEGATIVE NEGATIVE Final    Comment:        The GeneXpert MRSA Assay (FDA approved for NASAL specimens only), is one component of a comprehensive MRSA colonization surveillance  program. It is not intended to diagnose MRSA infection nor to guide or monitor treatment for MRSA infections.       Studies/Results: Dg Chest Port 1 View  01/28/2015  CLINICAL DATA:  Acute onset of shortness of breath. Bibasilar crackles. Initial encounter. EXAM: PORTABLE CHEST 1  VIEW COMPARISON:  Chest radiograph performed 01/26/2015 FINDINGS: Lung expansion is mildly decreased. Small bilateral pleural effusions are seen. Bibasilar airspace opacities may reflect mild interstitial edema or possibly pneumonia. No pneumothorax is identified. The cardiomediastinal silhouette is mildly enlarged. A pacemaker is noted overlying the left chest wall, with leads ending overlying the right atrium and right ventricle. No acute osseous abnormalities are seen. IMPRESSION: Lung expansion mildly decreased. Small bilateral pleural effusions seen. Bibasilar airspace opacities may reflect mild interstitial edema or possibly pneumonia. Mild cardiomegaly noted. Electronically Signed   By: Garald Balding M.D.   On: 01/28/2015 00:29    Medications:  Scheduled: . amiodarone  200 mg Oral Daily  . clopidogrel  75 mg Oral Daily  . furosemide  20 mg Intravenous 3 times per day  . heparin  5,000 Units Subcutaneous 3 times per day  . lactose free nutrition  1 Container Oral Q24H  . levothyroxine  125 mcg Oral Daily  . pantoprazole  40 mg Oral Daily  . piperacillin-tazobactam (ZOSYN)  IV  2.25 g Intravenous 3 times per day  . vancomycin  500 mg Intravenous Q48H   Continuous:  [id number] **OR** acetaminophen, albuterol, alum & mag hydroxide-simeth, ondansetron **OR** ondansetron (ZOFRAN) IV  Assessment/Plan:  Principal Problem:   Cellulitis of leg, left Active Problems:   Hypotension   Thrombocytopenia (HCC)   Paroxysmal atrial fibrillation ([place])   Pacemaker - dual chamber Medtronic 2009   Hypertension   Sepsis ([place])   SOB (shortness of breath)   Palliative care encounter   Acute respiratory  failure with hypoxia (HCC)   Acute pulmonary edema (HCC)    Cellulitis of left leg/ sepsis She presented with lactic acidosis, leukocytosis. Continue vancomycin and Zosyn for now. Blood cultures negative so far. Doppler studies negative for DVT. Erythema is improved, per daughter.  Acute hypoxic respiratory failure with pulmonary edema/possible acute diastolic CHF Patient became quite this leg overnight. She was given Lasix yesterday with only partial relief. Continue scheduled intravenous Lasix for now. Continue nonrebreather. Declining status discussed with daughter. Their goal is comfort if none of this is reversible with nonaggressive measures.   Klebsiella in urine Unclear if she was experiencing any symptoms suggestive of UTI. Organism noted to be pansensitive. Continue current treatment.  Hypotension Currently stable. Continue to hold metoprolol. Monitor closely while on IV Lasix.  Thrombocytopenia  Acute on chronic possibly secondary to acute infection. Counts are stable.  AAA Possible rupture based on CT abdomen and pelvis. No abdominal pain at this time. EDP had consulted vascular surgery, Dr. Bridgett Larsson who had recommended palliative approach and patient/family did not want any surgery or interventions. Palliative medicine following.  Acute on CKD 3 Renal function appears to be close to baseline. Continue to monitor urine output.   Mildly elevated CK and Troponin with h/o CAD Most likely due to sepsis and laying on the floor. Continue Plavix.  History of Paroxysmal atrial fibrillation  Continue Amiodarone. Heart rate is reasonably controlled. Continue Plavix. Not a good candidate for anticoagulation due to multiple issues discussed above.  Pacemaker Dual chamber Medtronic 2009  Hypothyroidism Continue Synthroid  Mechanical fall  PT and OT to evaluate.   DVT Prophylaxis: Subcutaneous heparin    Code Status: DO NOT RESUSCITATE  Family Communication: Discussed with the  patient and her daughter  Disposition Plan: Will remain in step down for now. Await palliative medicine input today. We are likely heading towards comfort care.    LOS: 3 days   [facility]  [facility] Pager [phone] 01/29/2015, 11:57 AM  If 7PM-7AM, please contact night-coverage at [email or web address], password [facility]

## 2015-01-29 NOTE — Clinical Documentation Improvement (Signed)
Hospitalist  Can the diagnosis of CHF be further specified?    Acuity - Acute, Chronic, Acute on Chronic   Type - Systolic, Diastolic, Systolic and Diastolic  Other  Clinically Undetermined   Document any associated diagnoses/conditions   Supporting Information: Patient with a history of acute diastolic heart failure per 11/15 progress notes. Received lasix earlier today for heart failure exacerbation per 11/15 progress notes.  11/15: BNP: 404.1   Please exercise your independent, professional judgment when responding. A specific answer is not anticipated or expected.   Thank You,  [place] [phone]

## 2015-01-29 NOTE — Progress Notes (Signed)
Daily Progress Note   Patient Name: [name]       Date: 01/29/2015 DOB: [birth year]  Age: 81 y.o. MRN#: [id number] Attending Physician: Bonnielee Haff, MD Primary Care Physician: Lauree Chandler, NP Admit Date: 01/26/2015  Reason for Consultation/Follow-up: Establishing goals of care  Subjective: TIVONA WINNINGHAM is a 81 y.o. female with PMH of AAA, HTN, hypothyroidism, CAD, CHF who lives alone. She fell in the middle of the night and was brought to the ED the next day after being found on the floor by her daughter. On exam she is found to have cellulitis of her left leg. She was recently admitted for the same. She is severely deconditioned and has lost a great deal of weight due to lack of appetite. She spend most of her day in bed, sleeping. She has tried an appetite stimulant in the past which was ineffective. They have requested a palliative care consult to discuss goals of care.  Interval Events: Resting in chair, no acute distress noted currently  PLAN: Discussed extensively for a long time with the patient, daughter [name] about appropriate discharge options. Patient lives alone. Going back home alone is not a safe option anymore. Discussed skilled nursing facility at discharge. Patient's daughter asking about more information about hospice. Discussed addition of hospice services. Will request hospice consultation, consider residential hospice. Patient is elderly lady with acute on chronic medical conditions. It is likely that she may have a acute procedure patient was decline. Family does not want the patient to go to assisted living facility for rehabilitation attempt. They wish to keep her comfortable.  Length of Stay: 3 days  Current Medications: Scheduled Meds:  . amiodarone  200 mg Oral Daily  . clopidogrel  75 mg Oral Daily  . docusate sodium  100 mg Oral BID  . furosemide  20 mg Intravenous 3 times per day  . heparin  5,000 Units Subcutaneous 3 times per day  .  lactose free nutrition  1 Container Oral Q24H  . levothyroxine  125 mcg Oral Daily  . pantoprazole  40 mg Oral Daily  . piperacillin-tazobactam (ZOSYN)  IV  2.25 g Intravenous 3 times per day  . vancomycin  500 mg Intravenous Q48H    Continuous Infusions:    PRN Meds: acetaminophen **OR** acetaminophen, albuterol, alum & mag hydroxide-simeth, ondansetron **OR** ondansetron (ZOFRAN) IV, polyethylene glycol   Vital Signs: BP 117/40 mmHg  Pulse 70  Temp(Src) 97.6 F (36.4 C) (Oral)  Resp 19  Ht 5\' 2"  (1.575 m)  Wt 54.3 kg (119 lb 11.4 oz)  BMI 21.89 kg/m2  SpO2 93% SpO2: SpO2: 93 % O2 Device: O2 Device: NRB O2 Flow Rate: O2 Flow Rate (L/min): 14 L/min  Intake/output summary:   Intake/Output Summary (Last 24 hours) at 01/29/15 1457 Last data filed at 01/29/15 1215  Gross per 24 hour  Intake    450 ml  Output   2300 ml  Net  -1850 ml   LBM:   Baseline Weight: Weight: 46.72 kg (103 lb) Most recent weight: Weight: 54.3 kg (119 lb 11.4 oz)  Physical Exam: General: Alert,sitting in chair NT: No bruits, no goiter, no JVD Heart: Regular rate and rhythm. No murmur appreciated. Lungs:-Basilar crackles Abdomen: Soft, nontender, nondistended, positive bowel sounds.  Skin: Warm and dry Neuro: Grossly intact, nonfocal.        Additional Data Reviewed: Recent Labs     01/28/15  0720  01/29/15  0455  WBC  10.0  11.9*  HGB  9.3*  9.8*  PLT  110*  137*  NA  141  141  BUN  24*  18  CREATININE  1.46*  1.58*     Problem List:  Patient Active Problem List   Diagnosis Date Noted  . Acute respiratory failure with hypoxia ([place]) 01/29/2015  . Acute pulmonary edema ([place]) 01/29/2015  . SOB (shortness of breath)   . Palliative care encounter   . Sepsis ([place]) 01/26/2015  . Cellulitis of leg, left 01/26/2015  . Protein-calorie malnutrition, severe ([place]) 11/06/2014  . Pressure ulcer 11/06/2014  . Acute diastolic CHF (congestive heart failure) ([place]) 11/04/2014  . Weakness  11/04/2014  . Hypertension 11/04/2014  . Macrocytic anemia 11/04/2014  . Cardiomyopathy, ischemic 11/04/2014  . CHF exacerbation ([place]) 11/03/2014  . Body mass index (BMI) of 20.0-20.9 in adult 06/23/2014  . Hard of hearing 06/23/2014  . Rectal bleeding 07/16/2013  . Hyperlipidemia, statin intolerance 04/30/2013  . Paroxysmal atrial fibrillation ([place]) 04/30/2013  . Pacemaker - dual chamber Medtronic 2009 04/30/2013  . AAA (abdominal aortic aneurysm) ([place]) 04/30/2013  . Bilateral carotid artery stenosis 04/30/2013  . PAD (peripheral artery disease) ([place]) 04/30/2013  . Constipation 08/31/2011  . Hypotension 08/28/2011  . S/P total hip arthroplasty 08/28/2011  . CAD (coronary artery disease) 08/28/2011  . Hypothyroid 08/28/2011  . UTI (lower urinary tract infection) 08/28/2011  . Thrombocytopenia ([place]) 08/28/2011  . SSS (sick sinus syndrome) ([place]) 10/05/2007     Palliative Care Assessment & Plan    Code Status:  DNR  Goals of Care: -figuring out appropriate disposition options at this time.  Symptom Management:  Dyspnea: much improved  Pain: Reports well-controlled with Tylenol  Nausea: Continue Zofran as needed  Palliative Prophylaxis:   Bowel Regimen and Delirium Protocol  Psycho-social/Spiritual:  Desire for further Chaplaincy support: no   Prognosis: probably less than 6 months  Discharge Planning: [place] for rehab with Palliative care service follow-up versus residential hospice as per discussions with the patient's daughter [facility] plan was discussed with patient, her daughter, her son, son in law and bedside RN  Thank you for allowing the Palliative Medicine Team to assist in the care of this patient.   Time In: 1100 Time Out: 1125 Total Time 25 Prolonged Time Billed no    Greater than 50%  of this time was spent counseling and coordinating care related to the above assessment and plan.  [id number] Loistine Chance, MD    01/29/2015, 2:57 PM  Please contact Palliative Medicine Team phone at [phone] for questions and concerns.

## 2015-01-30 DIAGNOSIS — E876 Hypokalemia: Secondary | ICD-10-CM

## 2015-01-30 DIAGNOSIS — I480 Paroxysmal atrial fibrillation: Secondary | ICD-10-CM

## 2015-01-30 LAB — BASIC METABOLIC PANEL
Anion gap: 12 (ref 5–15)
Anion gap: 9 (ref 5–15)
BUN: 21 mg/dL — ABNORMAL HIGH (ref 6–20)
BUN: 20 mg/dL (ref 6–20)
CALCIUM: 7.8 mg/dL — AB (ref 8.9–10.3)
CHLORIDE: 101 mmol/L (ref 101–111)
CO2: 32 mmol/L (ref 22–32)
CO2: 32 mmol/L (ref 22–32)
CREATININE: 1.63 mg/dL — AB (ref 0.44–1.00)
Calcium: 8.3 mg/dL — ABNORMAL LOW (ref 8.9–10.3)
Chloride: 99 mmol/L — ABNORMAL LOW (ref 101–111)
Creatinine, Ser: 1.66 mg/dL — ABNORMAL HIGH (ref 0.44–1.00)
GFR calc Af Amer: 29 mL/min — ABNORMAL LOW (ref >60)
GFR, EST AFRICAN AMERICAN: 30 mL/min — AB (ref >60)
GFR, EST NON AFRICAN AMERICAN: 26 mL/min — AB (ref >60)
GFR, EST NON AFRICAN AMERICAN: 25 mL/min — AB (ref >60)
Glucose, Bld: 124 mg/dL — ABNORMAL HIGH (ref 65–99)
Glucose, Bld: 140 mg/dL — ABNORMAL HIGH (ref 65–99)
POTASSIUM: 2.5 mmol/L — AB (ref 3.5–5.1)
POTASSIUM: 2.9 mmol/L — AB (ref 3.5–5.1)
SODIUM: 145 mmol/L (ref 135–145)
SODIUM: 140 mmol/L (ref 135–145)

## 2015-01-30 LAB — CBC
HCT: 34.5 % — ABNORMAL LOW (ref 36.0–46.0)
Hemoglobin: 10.6 g/dL — ABNORMAL LOW (ref 12.0–15.0)
MCH: 30.8 pg (ref 26.0–34.0)
MCHC: 30.7 g/dL (ref 30.0–36.0)
MCV: 100.3 fL — ABNORMAL HIGH (ref 78.0–100.0)
Platelets: 124 K/uL — ABNORMAL LOW (ref 150–400)
RBC: 3.44 MIL/uL — ABNORMAL LOW (ref 3.87–5.11)
RDW: 14.9 % (ref 11.5–15.5)
WBC: 10.2 K/uL (ref 4.0–10.5)

## 2015-01-30 LAB — MAGNESIUM: MAGNESIUM: 1.4 mg/dL — AB (ref 1.7–2.4)

## 2015-01-30 MED ORDER — POTASSIUM CHLORIDE CRYS ER 20 MEQ PO TBCR
20.0000 meq | EXTENDED_RELEASE_TABLET | Freq: Once | ORAL | Status: AC
  Administered 2015-01-30: 20 meq via ORAL
  Filled 2015-01-30: qty 1

## 2015-01-30 MED ORDER — POTASSIUM CHLORIDE CRYS ER 20 MEQ PO TBCR
40.0000 meq | EXTENDED_RELEASE_TABLET | Freq: Once | ORAL | Status: AC
  Administered 2015-01-30: 40 meq via ORAL
  Filled 2015-01-30: qty 2

## 2015-01-30 MED ORDER — MAGNESIUM SULFATE IN D5W 10-5 MG/ML-% IV SOLN
1.0000 g | Freq: Once | INTRAVENOUS | Status: AC
  Administered 2015-01-30: 1 g via INTRAVENOUS
  Filled 2015-01-30 (×2): qty 100

## 2015-01-30 MED ORDER — FUROSEMIDE 10 MG/ML IJ SOLN
20.0000 mg | Freq: Two times a day (BID) | INTRAMUSCULAR | Status: DC
  Administered 2015-01-30 – 2015-01-31 (×2): 20 mg via INTRAVENOUS
  Filled 2015-01-30 (×2): qty 2

## 2015-01-30 MED ORDER — POTASSIUM CHLORIDE CRYS ER 20 MEQ PO TBCR
30.0000 meq | EXTENDED_RELEASE_TABLET | ORAL | Status: AC
  Administered 2015-01-30 (×2): 30 meq via ORAL
  Filled 2015-01-30 (×2): qty 2

## 2015-01-30 NOTE — Progress Notes (Signed)
CRITICAL VALUE ALERT  Critical value received:  Potassium 2.5  Date of notification:  01/30/15  Time of notification:  [phone]  Critical value read back: YES  Nurse who received alert:  Sherryl Manges, RN, BSN  MD notified (1st page):  Baltazar Najjar, NP   Time of first page:  [phone]  MD notified (2nd page):  Time of second page:  Responding MD:    Time MD responded:

## 2015-01-30 NOTE — Progress Notes (Signed)
NURSING PROGRESS NOTE  [name]  MRN: [id number]  Transfer Data: 01/30/2015 9:01 PM Attending Provider: Bonnielee Haff, MD  PCP: Lauree Chandler, NP  Code status: DNR  Allergies:  Allergies  Allergen Reactions  . Nubain [Nalbuphine Hcl] Anaphylaxis  . Mirtazapine     "Weakness in legs"  . Statins     Leg weakness  . Iodine Rash     Past Medical History:  has a past medical history of GI bleed (2007); Hypertension; TIA (transient ischemic attack); Hypothyroidism; Abdominal aortic aneurysm (HCC); CAD (coronary artery disease); SSS (sick sinus syndrome) ([place]) (10/05/2007); Myocardial infarction ([place]); Atrial fibrillation ([place]); COPD (chronic obstructive pulmonary disease) ([place]); Asthma; Anemia; Blood transfusion; UTI (lower urinary tract infection); Renal failure, chronic; H/O hiatal hernia; GERD (gastroesophageal reflux disease); Stroke [facility]); Osteoporosis; Blood transfusion without reported diagnosis; and Pacemaker.   Past Surgical History:  has past surgical history that includes Pacemaker insertion (10/05/2007); Coronary stent placement; Cardiac catheterization; Fracture surgery; Total hip arthroplasty; Total hip arthroplasty (08/27/2011); US ECHOCARDIOGRAPHY (08/23/2011); NM MYOCAR PERF WALL MOTION (10/08/2010); and Colonoscopy.   [name] is a 81 y.o. y.o. female patient, arrived to floor in room 5W10 via bed. Patient alert and oriented X 3, disoriented to time. No acute distress noted. Denies pain.   Vital signs: Oral temperature 98.4 F (36.9 C), Blood pressure 108/52, Pulse 100, RR 20, SpO2 94 % on 4L oxygen nasal cannula. Height 5'3" (160 cm), weight 117 lbs (53.1 kg).   Cardiac monitoring: None.  IV access: Left antecubital ; condition patent and no redness.  Skin: intact, no pressure ulcer noted in sacral area.   Patient's ID armband verified with patient/ family, and in place. Information packet given to patient/ family. Fall risk assessed, SR up X2, patient/ family  able to verbalize understanding of risks associated with falls and to call nurse or staff to assist before getting out of bed. Patient/ family oriented to room and equipment. Call bell within reach.

## 2015-01-30 NOTE — Progress Notes (Signed)
[facility] PROGRESS NOTE  [name] [id number] DOB: 09/15/1917 DOA: 01/26/2015  PCP: Lauree Chandler, NP  Brief HPI: Patient is a 81 year old Caucasian female with past medical history of AAA, hypertension, hypothyroidism, who presented after a fall at home. Patient underwent CT scan of the abdomen in the ER which revealed an enlarging AAA with possible extravasation of blood. Her blood pressure was in the 80s on arrival. She was given IV fluids. She was thought to have cellulitis of her left lower extremity. She was hospitalized for further management. Subsequently, patient developed fluid overload. Patient was given Lasix with good response.  Past medical history:  Past Medical History  Diagnosis Date  . GI bleed 2007  . Hypertension   . TIA (transient ischemic attack)   . Hypothyroidism   . Abdominal aortic aneurysm ([place])   . CAD (coronary artery disease)   . SSS (sick sinus syndrome) ([place]) 10/05/2007    Medtronic Adapta  . Myocardial infarction ([place])   . Atrial fibrillation ([place])   . COPD (chronic obstructive pulmonary disease) ([place])   . Asthma     hx  . Anemia     hx  . Blood transfusion   . UTI (lower urinary tract infection)     hx  . Renal failure, chronic   . H/O hiatal hernia   . GERD (gastroesophageal reflux disease)   . Stroke ([place])   . Osteoporosis   . Blood transfusion without reported diagnosis   . Pacemaker     Consultants: Palliative medicine  Procedures: None  Antibiotics: Vancomycin and Zosyn 11/13  Subjective: Patient noted to be sitting up on her bed today. Her daughter is at the bedside. Patient feels much better. Breathing much easier than yesterday. Denies any chest pain. Her left leg is much improved compared to the last few days.  Objective: Vital Signs  Filed Vitals:   01/30/15 0338 01/30/15 0732 01/30/15 0800 01/30/15 1137  BP: 103/48  126/65 99/47  Pulse: 105  121 78  Temp: 99.1 F (37.3 C)  98.6 F (37 C) 97.7  F (36.5 C)  TempSrc: Oral  Oral Oral  Resp: 21  27 23   Height:      Weight:  53.3 kg (117 lb 8.1 oz)    SpO2: 91%  98% 96%    Intake/Output Summary (Last 24 hours) at 01/30/15 1200 Last data filed at 01/30/15 0900  Gross per 24 hour  Intake    610 ml  Output   3000 ml  Net  -2390 ml   Filed Weights   01/26/15 1022 01/26/15 1600 01/30/15 0732  Weight: 46.72 kg (103 lb) 54.3 kg (119 lb 11.4 oz) 53.3 kg (117 lb 8.1 oz)    General appearance: distracted, no distress and Slightly lethargic Resp: improving air entry bilaterally. Still has crackles at the bases. No wheezing. No rhonchi. Cardio: regular rate and rhythm, S1, S2 normal, no murmur, click, rub or gallop GI: soft, non-tender; bowel sounds normal; no masses,  no organomegaly Extremities: improving Erythema of the left lower extremity. Lasix warm to touch. Neurologic: alert and awake today. No Cranial deficits. Moving all her extremities.  Lab Results:  Basic Metabolic Panel:  Recent Labs Lab 01/26/15 1030 01/26/15 1705 01/27/15 0551 01/28/15 0720 01/29/15 0455 01/30/15 0234 01/30/15 0530  NA 136  --  140 141 141 145  --   K 4.0  --  4.3 5.1 3.6 2.5*  --   CL 97*  --  111  112* 107 101  --   CO2 26  --  24 24 22  32  --   GLUCOSE 121*  --  81 131* 100* 124*  --   BUN 34*  --  25* 24* 18 21*  --   CREATININE 1.80* 1.55* 1.51* 1.46* 1.58* 1.63*  --   CALCIUM 8.6*  --  7.3* 7.8* 8.0* 8.3*  --   MG  --   --   --   --   --   --  1.4*   Liver Function Tests:  Recent Labs Lab 01/26/15 1030  AST 218*  ALT 97*  ALKPHOS 72  BILITOT 1.1  PROT 6.1*  ALBUMIN 2.9*    Recent Labs Lab 01/26/15 1030  LIPASE 26   CBC:  Recent Labs Lab 01/26/15 1030 01/26/15 1705 01/27/15 0551 01/28/15 0720 01/29/15 0455 01/30/15 0234  WBC 30.4* 18.4* 14.8* 10.0 11.9* 10.2  NEUTROABS 28.6*  --   --   --   --   --   HGB 11.6* 9.5* 9.3* 9.3* 9.8* 10.6*  HCT 36.9 30.4* 30.5* 29.8* 32.3* 34.5*  MCV 98.9 98.1 100.0 100.7*  100.6* 100.3*  PLT 157 112* 115* 110* 137* 124*   Cardiac Enzymes:  Recent Labs Lab 01/26/15 1030  CKTOTAL 287*  TROPONINI 0.29*   BNP (last 3 results)  Recent Labs  11/03/14 1807 01/28/15 0935  BNP 377.4* 404.1*     Recent Results (from the past 240 hour(s))  Urine culture     Status: None   Collection Time: 01/26/15 12:30 PM  Result Value Ref Range Status   Specimen Description URINE, CATHETERIZED  Final   Special Requests NONE  Final   Culture   Final    >=100,000 COLONIES/mL KLEBSIELLA PNEUMONIAE Performed at [facility]    Report Status 01/29/2015 FINAL  Final   Organism ID, Bacteria KLEBSIELLA PNEUMONIAE  Final      Susceptibility   Klebsiella pneumoniae - MIC*    AMPICILLIN >=32 RESISTANT Resistant     CEFAZOLIN <=4 SENSITIVE Sensitive     CEFTRIAXONE <=1 SENSITIVE Sensitive     CIPROFLOXACIN <=0.25 SENSITIVE Sensitive     GENTAMICIN <=1 SENSITIVE Sensitive     IMIPENEM <=0.25 SENSITIVE Sensitive     NITROFURANTOIN <=16 SENSITIVE Sensitive     TRIMETH/SULFA <=20 SENSITIVE Sensitive     AMPICILLIN/SULBACTAM 4 SENSITIVE Sensitive     PIP/TAZO <=4 SENSITIVE Sensitive     * >=100,000 COLONIES/mL KLEBSIELLA PNEUMONIAE  Blood culture (routine x 2)     Status: None (Preliminary result)   Collection Time: 01/26/15  1:05 PM  Result Value Ref Range Status   Specimen Description BLOOD LEFT ANTECUBITAL  Final   Special Requests BOTTLES DRAWN AEROBIC AND ANAEROBIC 5CC EACH  Final   Culture   Final    NO GROWTH 3 DAYS Performed at [facility]    Report Status PENDING  Incomplete  Blood culture (routine x 2)     Status: None (Preliminary result)   Collection Time: 01/26/15  1:50 PM  Result Value Ref Range Status   Specimen Description BLOOD LEFT WRIST  Final   Special Requests IN PEDIATRIC BOTTLE 3CC  Final   Culture   Final    NO GROWTH 3 DAYS Performed at [facility]    Report Status PENDING  Incomplete  MRSA PCR Screening      Status: None   Collection Time: 01/26/15  4:00 PM  Result Value Ref Range Status  MRSA by PCR NEGATIVE NEGATIVE Final    Comment:        The GeneXpert MRSA Assay (FDA approved for NASAL specimens only), is one component of a comprehensive MRSA colonization surveillance program. It is not intended to diagnose MRSA infection nor to guide or monitor treatment for MRSA infections.       Studies/Results: No results found.  Medications:  Scheduled: . amiodarone  200 mg Oral Daily  . clopidogrel  75 mg Oral Daily  . docusate sodium  100 mg Oral BID  . furosemide  20 mg Intravenous Q12H  . heparin  5,000 Units Subcutaneous 3 times per day  . lactose free nutrition  1 Container Oral Q24H  . levothyroxine  125 mcg Oral Daily  . magnesium sulfate 1 - 4 g bolus IVPB  1 g Intravenous Once  . pantoprazole  40 mg Oral Daily  . piperacillin-tazobactam (ZOSYN)  IV  2.25 g Intravenous 3 times per day  . vancomycin  500 mg Intravenous Q48H   Continuous:  [id number] **OR** acetaminophen, albuterol, alum & mag hydroxide-simeth, ondansetron **OR** ondansetron (ZOFRAN) IV, polyethylene glycol  Assessment/Plan:  Principal Problem:   Cellulitis of leg, left Active Problems:   Hypotension   Thrombocytopenia (HCC)   Paroxysmal atrial fibrillation ([place])   Pacemaker - dual chamber Medtronic 2009   Hypertension   Sepsis ([place])   SOB (shortness of breath)   Palliative care encounter   Acute respiratory failure with hypoxia (HCC)   Acute pulmonary edema (HCC)    Cellulitis of left leg/ sepsis She presented with lactic acidosis, leukocytosis. Continue vancomycin and Zosyn for now. Blood cultures negative so far. Doppler studies negative for DVT. Erythema is improved, per daughter. Anticipate change to oral antibiotics tomorrow.  Acute hypoxic respiratory failure with pulmonary edema/possible acute diastolic CHF Patient became quite dyspneic 2 days ago. She was given Lasix. Patient  has improved significantly.  She is diuresing well. Weight is decreasing. She has been now placed on oxygen by nasal cannula. Palliative medicine is following.  Klebsiella in urine Unclear if she was experiencing any symptoms suggestive of UTI. Organism noted to be pansensitive. Continue current treatment.  Hypotension Currently stable. Monitor closely while on IV Lasix.  Thrombocytopenia  Acute on chronic possibly secondary to acute infection. Counts are stable.  AAA Possible rupture based on CT abdomen and pelvis. No abdominal pain at this time. EDP had consulted vascular surgery, Dr. Bridgett Larsson who had recommended palliative approach and patient/family did not want any surgery or interventions. Palliative medicine following.  Acute on CKD 3/hypokalemia Renal function appears to be close to baseline. Continue to monitor urine output. Slight rise in creatinine noted, most likely due to Lasix. Continue to monitor. Replace Potassium. Magnesium is also noted to be low, which would also be corrected. Recheck labs later this afternoon.  Mildly elevated CK and Troponin with h/o CAD Most likely due to sepsis and laying on the floor. Continue Plavix.  History of Paroxysmal atrial fibrillation  Continue Amiodarone. Heart rate is reasonably controlled. Continue Plavix. Not a good candidate for anticoagulation due to multiple issues discussed above.   Pacemaker Dual chamber Medtronic 2009  Hypothyroidism Continue Synthroid  Mechanical fall  PT and OT to evaluate.   DVT Prophylaxis: Subcutaneous heparin    Code Status: DO NOT RESUSCITATE  Family Communication: Discussed with the patient and her daughter  Disposition Plan: if potassium level is improved this afternoon, she could be transferred to the floor. Palliative medicine continues to follow  for disposition. Anticipate change to oral antibiotics tomorrow. Patient actually looks better this morning compared to yesterday. Await PT and OT  evaluation.    LOS: 4 days   [facility] Pager [phone] 01/30/2015, 12:00 PM  If 7PM-7AM, please contact night-coverage at [email or web address], password [facility]

## 2015-01-30 NOTE — Care Management Note (Signed)
Case Management Note  Patient Details  Name: CARLA RASHAD MRN: [id number] Date of Birth: 28-May-1917  Subjective/Objective:   Met with dtr and son-in-law @ pt's bedside.  Discussed discharge options and dtr is hopeful pt will qualify for transfer to residential hospice facility.  They are interested in facility either in [facility] or [place].  Pt was recently in [facility], was ambulatory when discharged home but PO intake was minimal and pt quickly became increasingly weak and unable to ambulate.  Dtrs are unable to provide care @ home, so home with hospice is not an option.                           Expected Discharge Plan:  [place]  In-House Referral:  Clinical Social Work  Discharge planning Services  CM Consult  Status of Service:  In process, will continue to follow  Medicare Important Message Given:  Yes  Girard Cooter, RN 01/30/2015, 2:36 PM

## 2015-01-30 NOTE — Progress Notes (Signed)
RT called into room by RN for PRN breathing treatment, RT assessed pt all vital signs within normal range and pt stated she did not want breathing treatment. RT will continue to monitor

## 2015-01-31 ENCOUNTER — Inpatient Hospital Stay (HOSPITAL_COMMUNITY): Payer: Medicare Other

## 2015-01-31 LAB — CULTURE, BLOOD (ROUTINE X 2)
Culture: NO GROWTH
Culture: NO GROWTH

## 2015-01-31 LAB — BASIC METABOLIC PANEL
Anion gap: 7 (ref 5–15)
BUN: 20 mg/dL (ref 6–20)
CALCIUM: 8.1 mg/dL — AB (ref 8.9–10.3)
CO2: 35 mmol/L — ABNORMAL HIGH (ref 22–32)
Chloride: 100 mmol/L — ABNORMAL LOW (ref 101–111)
Creatinine, Ser: 1.58 mg/dL — ABNORMAL HIGH (ref 0.44–1.00)
GFR calc Af Amer: 31 mL/min — ABNORMAL LOW (ref >60)
GFR, EST NON AFRICAN AMERICAN: 27 mL/min — AB (ref >60)
GLUCOSE: 95 mg/dL (ref 65–99)
Potassium: 3.8 mmol/L (ref 3.5–5.1)
Sodium: 142 mmol/L (ref 135–145)

## 2015-01-31 MED ORDER — SACCHAROMYCES BOULARDII 250 MG PO CAPS
250.0000 mg | ORAL_CAPSULE | Freq: Two times a day (BID) | ORAL | Status: DC
  Administered 2015-01-31 – 2015-02-01 (×3): 250 mg via ORAL
  Filled 2015-01-31 (×3): qty 1

## 2015-01-31 MED ORDER — LOPERAMIDE HCL 2 MG PO CAPS
2.0000 mg | ORAL_CAPSULE | Freq: Three times a day (TID) | ORAL | Status: DC | PRN
  Administered 2015-02-01: 2 mg via ORAL
  Filled 2015-01-31: qty 1

## 2015-01-31 MED ORDER — POTASSIUM CHLORIDE CRYS ER 20 MEQ PO TBCR
40.0000 meq | EXTENDED_RELEASE_TABLET | Freq: Once | ORAL | Status: AC
  Administered 2015-01-31: 40 meq via ORAL
  Filled 2015-01-31: qty 2

## 2015-01-31 MED ORDER — FUROSEMIDE 20 MG PO TABS
20.0000 mg | ORAL_TABLET | Freq: Every day | ORAL | Status: DC
  Administered 2015-02-01: 20 mg via ORAL
  Filled 2015-01-31: qty 1

## 2015-01-31 MED ORDER — DOXYCYCLINE HYCLATE 100 MG PO TABS
100.0000 mg | ORAL_TABLET | Freq: Two times a day (BID) | ORAL | Status: DC
  Administered 2015-01-31 – 2015-02-01 (×3): 100 mg via ORAL
  Filled 2015-01-31 (×3): qty 1

## 2015-01-31 NOTE — Progress Notes (Signed)
[facility] PROGRESS NOTE  [name] [id number] DOB: 02-15-1918 DOA: 01/26/2015  PCP: Lauree Chandler, NP  Brief HPI: Patient is a 81 year old Caucasian female with past medical history of AAA, hypertension, hypothyroidism, who presented after a fall at home. Patient underwent CT scan of the abdomen in the ER which revealed an enlarging AAA with possible extravasation of blood. Her blood pressure was in the 80s on arrival. She was given IV fluids. She was thought to have cellulitis of her left lower extremity. She was hospitalized for further management. Subsequently, patient developed fluid overload. Patient was given Lasix with partial response.   Past medical history:  Past Medical History  Diagnosis Date  . GI bleed 2007  . Hypertension   . TIA (transient ischemic attack)   . Hypothyroidism   . Abdominal aortic aneurysm ([place])   . CAD (coronary artery disease)   . SSS (sick sinus syndrome) ([place]) 10/05/2007    Medtronic Adapta  . Myocardial infarction ([place])   . Atrial fibrillation ([place])   . COPD (chronic obstructive pulmonary disease) ([place])   . Asthma     hx  . Anemia     hx  . Blood transfusion   . UTI (lower urinary tract infection)     hx  . Renal failure, chronic   . H/O hiatal hernia   . GERD (gastroesophageal reflux disease)   . Stroke ([place])   . Osteoporosis   . Blood transfusion without reported diagnosis   . Pacemaker     Consultants: Palliative medicine  Procedures: None  Antibiotics: Vancomycin and Zosyn 11/13-11/18 Doxycycline 11/18  Subjective: Patient feels weak. Denies any chest pain or shortness of breath. No nausea, vomiting. Some complaints of right hip pain, especially when she worked with physical therapy.   Objective: Vital Signs  Filed Vitals:   01/30/15 2049 01/30/15 2110 01/31/15 0539 01/31/15 0910  BP:  108/52 100/50 93/51  Pulse:  100 85   Temp:  98.4 F (36.9 C) 98.6 F (37 C)   TempSrc:  Oral    Resp:  20 20    Height: 5\' 3"  (1.6 m)     Weight: 53.1 kg (117 lb 1 oz)  53.2 kg (117 lb 4.6 oz)   SpO2:  94% 98%     Intake/Output Summary (Last 24 hours) at 01/31/15 1242 Last data filed at 01/31/15 0900  Gross per 24 hour  Intake    660 ml  Output   1200 ml  Net   -540 ml   Filed Weights   01/30/15 0732 01/30/15 2049 01/31/15 0539  Weight: 53.3 kg (117 lb 8.1 oz) 53.1 kg (117 lb 1 oz) 53.2 kg (117 lb 4.6 oz)    General appearance: distracted, no distress and Slightly lethargic Resp: improved air entry bilaterally. Less crackles compared to yesterday. No wheezing or rhonchi.  Cardio: regular rate and rhythm, S1, S2 normal, no murmur, click, rub or gallop GI: soft, non-tender; bowel sounds normal; no masses,  no organomegaly Extremities: improving erythema of the left lower extremity. Lasix warm to touch. Neurologic: alert and awake today. No Cranial deficits. Moving all her extremities.  Lab Results:  Basic Metabolic Panel:  Recent Labs Lab 01/28/15 0720 01/29/15 0455 01/30/15 0234 01/30/15 0530 01/30/15 1415 01/31/15 0732  NA 141 141 145  --  140 142  K 5.1 3.6 2.5*  --  2.9* 3.8  CL 112* 107 101  --  99* 100*  CO2 24 22 32  --  32 35*  GLUCOSE 131* 100* 124*  --  140* 95  BUN 24* 18 21*  --  20 20  CREATININE 1.46* 1.58* 1.63*  --  1.66* 1.58*  CALCIUM 7.8* 8.0* 8.3*  --  7.8* 8.1*  MG  --   --   --  1.4*  --   --    Liver Function Tests:  Recent Labs Lab 01/26/15 1030  AST 218*  ALT 97*  ALKPHOS 72  BILITOT 1.1  PROT 6.1*  ALBUMIN 2.9*    Recent Labs Lab 01/26/15 1030  LIPASE 26   CBC:  Recent Labs Lab 01/26/15 1030 01/26/15 1705 01/27/15 0551 01/28/15 0720 01/29/15 0455 01/30/15 0234  WBC 30.4* 18.4* 14.8* 10.0 11.9* 10.2  NEUTROABS 28.6*  --   --   --   --   --   HGB 11.6* 9.5* 9.3* 9.3* 9.8* 10.6*  HCT 36.9 30.4* 30.5* 29.8* 32.3* 34.5*  MCV 98.9 98.1 100.0 100.7* 100.6* 100.3*  PLT 157 112* 115* 110* 137* 124*   Cardiac Enzymes:  Recent  Labs Lab 01/26/15 1030  CKTOTAL 287*  TROPONINI 0.29*   BNP (last 3 results)  Recent Labs  11/03/14 1807 01/28/15 0935  BNP 377.4* 404.1*     Recent Results (from the past 240 hour(s))  Urine culture     Status: None   Collection Time: 01/26/15 12:30 PM  Result Value Ref Range Status   Specimen Description URINE, CATHETERIZED  Final   Special Requests NONE  Final   Culture   Final    >=100,000 COLONIES/mL KLEBSIELLA PNEUMONIAE Performed at [facility]    Report Status 01/29/2015 FINAL  Final   Organism ID, Bacteria KLEBSIELLA PNEUMONIAE  Final      Susceptibility   Klebsiella pneumoniae - MIC*    AMPICILLIN >=32 RESISTANT Resistant     CEFAZOLIN <=4 SENSITIVE Sensitive     CEFTRIAXONE <=1 SENSITIVE Sensitive     CIPROFLOXACIN <=0.25 SENSITIVE Sensitive     GENTAMICIN <=1 SENSITIVE Sensitive     IMIPENEM <=0.25 SENSITIVE Sensitive     NITROFURANTOIN <=16 SENSITIVE Sensitive     TRIMETH/SULFA <=20 SENSITIVE Sensitive     AMPICILLIN/SULBACTAM 4 SENSITIVE Sensitive     PIP/TAZO <=4 SENSITIVE Sensitive     * >=100,000 COLONIES/mL KLEBSIELLA PNEUMONIAE  Blood culture (routine x 2)     Status: None   Collection Time: 01/26/15  1:05 PM  Result Value Ref Range Status   Specimen Description BLOOD LEFT ANTECUBITAL  Final   Special Requests BOTTLES DRAWN AEROBIC AND ANAEROBIC 5CC EACH  Final   Culture   Final    NO GROWTH 5 DAYS Performed at [facility]    Report Status 01/31/2015 FINAL  Final  Blood culture (routine x 2)     Status: None   Collection Time: 01/26/15  1:50 PM  Result Value Ref Range Status   Specimen Description BLOOD LEFT WRIST  Final   Special Requests IN PEDIATRIC BOTTLE 3CC  Final   Culture   Final    NO GROWTH 5 DAYS Performed at [facility]    Report Status 01/31/2015 FINAL  Final  MRSA PCR Screening     Status: None   Collection Time: 01/26/15  4:00 PM  Result Value Ref Range Status   MRSA by PCR NEGATIVE NEGATIVE  Final    Comment:        The GeneXpert MRSA Assay (FDA approved for NASAL specimens only), is one component  of a comprehensive MRSA colonization surveillance program. It is not intended to diagnose MRSA infection nor to guide or monitor treatment for MRSA infections.       Studies/Results: Dg Chest Port 1 View  01/31/2015  CLINICAL DATA:  Pulmonary edema and shortness of Breath EXAM: PORTABLE CHEST - 1 VIEW COMPARISON:  01/28/2015 FINDINGS: Cardiac shadow is stable. A pacing device is again seen. Aortic calcifications are again noted. Bilateral pleural effusions are again seen and stable. The interstitial opacities noted on the previous exam are stable in the interval from the prior study. IMPRESSION: No significant change from the prior study. Electronically Signed   By: Inez Catalina M.D.   On: 01/31/2015 07:58    Medications:  Scheduled: . amiodarone  200 mg Oral Daily  . clopidogrel  75 mg Oral Daily  . doxycycline  100 mg Oral Q12H  . furosemide  20 mg Intravenous Q12H  . heparin  5,000 Units Subcutaneous 3 times per day  . lactose free nutrition  1 Container Oral Q24H  . levothyroxine  125 mcg Oral Daily  . pantoprazole  40 mg Oral Daily  . saccharomyces boulardii  250 mg Oral BID   Continuous:  [id number] **OR** acetaminophen, albuterol, alum & mag hydroxide-simeth, loperamide, ondansetron **OR** ondansetron (ZOFRAN) IV  Assessment/Plan:  Principal Problem:   Cellulitis of leg, left Active Problems:   Hypotension   Thrombocytopenia (HCC)   Paroxysmal atrial fibrillation ([place])   Pacemaker - dual chamber Medtronic 2009   Hypertension   Sepsis ([place])   SOB (shortness of breath)   Palliative care encounter   Acute respiratory failure with hypoxia ([place])   Acute pulmonary edema (HCC)    Cellulitis of left leg/sepsis She presented with lactic acidosis, leukocytosis. Patient has improved. Cellulitis is improving. K to change to oral antibiotics today. We'll  initiate doxycycline. Blood cultures are negative. Doppler studies negative for DVT.   Acute hypoxic respiratory failure with pulmonary edema/possible acute diastolic CHF Patient became quite dyspneic during this hospitalization. Patient has improved with Lasix. Chest x-ray surprisingly does not show much improvement. However, clinically she has improved. Weight has decreased. Palliative medicine is following. Continue oxygen, but try to wean down. Anticipate change to oral Lasix tomorrow.  Klebsiella in urine Unclear if she was experiencing any symptoms suggestive of UTI. Organism noted to be pansensitive. Patient has received 5 days of Zosyn, which should've treated this infection.   Hypotension Blood pressure remains borderline low. However, patient remains asymptomatic.   Thrombocytopenia  Acute on chronic possibly secondary to acute infection. Counts are stable.  AAA Possible rupture based on CT abdomen and pelvis. No abdominal pain at this time. EDP had consulted vascular surgery, Dr. Bridgett Larsson who had recommended palliative approach and patient/family did not want any surgery or interventions. Palliative medicine following.  Acute on CKD 3/hypokalemia Renal function appears to be close to baseline. Continue to monitor urine output. Creatinine remains stable. Potassium is improved.   Mildly elevated CK and Troponin with h/o CAD Most likely due to sepsis and laying on the floor. Continue Plavix. No further workup anticipated.  History of Paroxysmal atrial fibrillation  Continue Amiodarone. Heart rate is reasonably controlled. Continue Plavix. Not a good candidate for anticoagulation due to multiple issues discussed above.   Pacemaker Dual chamber Medtronic 2009  Hypothyroidism Continue Synthroid  Mechanical fall  PT and OT is following.  DVT Prophylaxis: Subcutaneous heparin    Code Status: DO NOT RESUSCITATE  Family Communication: Discussed with the patient and  her daughter    Disposition Plan: Patient is slowly improving. However, she remains deconditioned. Family is conflicted about disposition. They want her to improve functionally, but at the same time want her to be comfortable. I have conveyed to the patient's daughter that patient does not have a good long-term prognosis. However, she does not meet criteria to go to residential hospice. However, she may benefit from hospice at SNF rather than rehabilitation. Palliative medicine to assist with these discussions. Patient could be ready to go to skilled nursing facility tomorrow.     LOS: 5 days   [place] [facility] Pager [phone] 01/31/2015, 12:42 PM  If 7PM-7AM, please contact night-coverage at [email or web address], password [facility]

## 2015-01-31 NOTE — NC FL2 (Signed)
[place] MEDICAID FL2 LEVEL OF CARE SCREENING TOOL     IDENTIFICATION  Patient Name: [name] Birthdate: 24-Oct-1917 Sex: female Admission Date (Current Location): 01/26/2015  [facility] and [place] Number: Herbalist and Address:  [facility], [place] [address] [place], [place] [address]      Provider Number: [id number]  Attending Physician Name and Address:  Bonnielee Haff, MD  Relative Name and Phone Number:       Current Level of Care: Hospital Recommended Level of Care: [place] Prior Approval Number:    Date Approved/Denied:   PASRR Number: [id number]  Discharge Plan: SNF    Current Diagnoses: Patient Active Problem List   Diagnosis Date Noted  . Acute respiratory failure with hypoxia ([place]) 01/29/2015  . Acute pulmonary edema ([place]) 01/29/2015  . SOB (shortness of breath)   . Palliative care encounter   . Sepsis ([place]) 01/26/2015  . Cellulitis of leg, left 01/26/2015  . Protein-calorie malnutrition, severe ([place]) 11/06/2014  . Pressure ulcer 11/06/2014  . Acute diastolic CHF (congestive heart failure) ([place]) 11/04/2014  . Weakness 11/04/2014  . Hypertension 11/04/2014  . Macrocytic anemia 11/04/2014  . Cardiomyopathy, ischemic 11/04/2014  . CHF exacerbation ([place]) 11/03/2014  . Body mass index (BMI) of 20.0-20.9 in adult 06/23/2014  . Hard of hearing 06/23/2014  . Rectal bleeding 07/16/2013  . Hyperlipidemia, statin intolerance 04/30/2013  . Paroxysmal atrial fibrillation ([place]) 04/30/2013  . Pacemaker - dual chamber Medtronic 2009 04/30/2013  . AAA (abdominal aortic aneurysm) ([place]) 04/30/2013  . Bilateral carotid artery stenosis 04/30/2013  . PAD (peripheral artery disease) ([place]) 04/30/2013  . Constipation 08/31/2011  . Hypotension 08/28/2011  . S/P total hip arthroplasty 08/28/2011  . CAD (coronary artery disease) 08/28/2011  . Hypothyroid 08/28/2011  . UTI (lower urinary tract infection) 08/28/2011  .  Thrombocytopenia ([place]) 08/28/2011  . SSS (sick sinus syndrome) ([place]) 10/05/2007    Orientation ACTIVITIES/SOCIAL BLADDER RESPIRATION    Self, Time, Place, Situation  Family supportive Indwelling catheter O2 (As needed) (4L)  BEHAVIORAL SYMPTOMS/MOOD NEUROLOGICAL BOWEL NUTRITION STATUS      Incontinent Diet (regular)  PHYSICIAN VISITS COMMUNICATION OF NEEDS Height & Weight Skin    Verbally   117 lbs. Other (Comment) (left leg scab)          AMBULATORY STATUS RESPIRATION    Assist extensive O2 (As needed) (4L)      Personal Care Assistance Level of Assistance  Bathing, Dressing Bathing Assistance: Maximum assistance   Dressing Assistance: Maximum assistance      Functional Limitations Info  Hearing   Hearing Info: Impaired (deaf in left ear)         [place]  PT (By licensed PT)     PT Frequency: 5/wk             Additional Factors Info  Code Status, Allergies Code Status Info: DNR Allergies Info: Nubain, miirtazapine, statins, iodine           Current Medications (01/31/2015): Current Facility-Administered Medications  Medication Dose Route Frequency Provider Last Rate Last Dose  . acetaminophen (TYLENOL) tablet 650 mg  650 mg Oral Q6H PRN Debbe Odea, MD   650 mg at 01/31/15 0650   Or  . acetaminophen (TYLENOL) suppository 650 mg  650 mg Rectal Q6H PRN Debbe Odea, MD      . albuterol (PROVENTIL) (2.5 MG/3ML) 0.083% nebulizer solution 2.5 mg  2.5 mg Nebulization Q4H PRN Ripudeep Krystal Eaton, MD  2.5 mg at 01/29/15 1742  . alum & mag hydroxide-simeth (MAALOX/MYLANTA) 200-200-20 MG/5ML suspension 30 mL  30 mL Oral Q6H PRN Debbe Odea, MD   30 mL at 01/30/15 0110  . amiodarone (PACERONE) tablet 200 mg  200 mg Oral Daily Debbe Odea, MD   200 mg at 01/31/15 0938  . clopidogrel (PLAVIX) tablet 75 mg  75 mg Oral Daily Debbe Odea, MD   75 mg at 01/31/15 0938  . doxycycline (VIBRA-TABS) tablet 100 mg  100 mg Oral Q12H Bonnielee Haff, MD    100 mg at 01/31/15 0937  . furosemide (LASIX) injection 20 mg  20 mg Intravenous Q12H Bonnielee Haff, MD   20 mg at 01/31/15 0640  . heparin injection 5,000 Units  5,000 Units Subcutaneous 3 times per day Debbe Odea, MD   5,000 Units at 01/31/15 0641  . lactose free nutrition (BOOST PLUS) liquid 237 mL  1 Container Oral Q24H Debbe Odea, MD   237 mL at 01/30/15 1751  . levothyroxine (SYNTHROID, LEVOTHROID) tablet 125 mcg  125 mcg Oral Daily Debbe Odea, MD   125 mcg at 01/31/15 0937  . ondansetron (ZOFRAN) tablet 4 mg  4 mg Oral Q6H PRN Debbe Odea, MD       Or  . ondansetron (ZOFRAN) injection 4 mg  4 mg Intravenous Q6H PRN Debbe Odea, MD   4 mg at 01/29/15 1231  . pantoprazole (PROTONIX) EC tablet 40 mg  40 mg Oral Daily Debbe Odea, MD   40 mg at 01/31/15 0937  . saccharomyces boulardii (FLORASTOR) capsule 250 mg  250 mg Oral BID Bonnielee Haff, MD   250 mg at 01/31/15 [id number]   Do not use this list as official medication orders. Please verify with discharge summary.  Discharge Medications:   Medication List    ASK your doctor about these medications        amiodarone 200 MG tablet  Commonly known as:  PACERONE  TAKE 1 TABLET BY MOUTH DAILY.     Biotin 1000 MCG tablet  Take 1,000 mcg by mouth daily.     CALCIUM 600 + D PO  Take 1 tablet by mouth daily.     clopidogrel 75 MG tablet  Commonly known as:  PLAVIX  TAKE 1 TABLET (75 MG TOTAL) BY MOUTH DAILY.     feeding supplement Liqd  Take 1 Container by mouth daily.     ferrous sulfate 325 (65 FE) MG tablet  Take 325 mg by mouth daily with breakfast.     furosemide 20 MG tablet  Commonly known as:  LASIX  Take 1 tablet (20 mg total) by mouth daily.     levothyroxine 125 MCG tablet  Commonly known as:  SYNTHROID, LEVOTHROID  TAKE 1 TABLET BY MOUTH EVERY DAY     nitroGLYCERIN 0.4 MG SL tablet  Commonly known as:  NITROSTAT  Place 1 tablet (0.4 mg total) under the tongue every 5 (five) minutes as needed for chest  pain (For a total of 3 tablets, if chest pain persist to call 911).     pantoprazole 40 MG tablet  Commonly known as:  PROTONIX  TAKE 1 TABLET (40 MG TOTAL) BY MOUTH DAILY.     potassium chloride SA 20 MEQ tablet  Commonly known as:  K-DUR,KLOR-CON  Take 20 mEq by mouth daily.     vitamin B-12 1000 MCG tablet  Commonly known as:  CYANOCOBALAMIN  Take 1,000 mcg by mouth daily.  Relevant Imaging Results:  Relevant Lab Results:  Recent Labs    Additional Information    Cranford Mon, LCSW

## 2015-01-31 NOTE — Progress Notes (Signed)
Physical Therapy Treatment Patient Details Name: [name] MRN: [id number] DOB: 1917-07-06 Today's Date: 01/31/2015    History of Present Illness [name] is a 81 y.o. female with PMH of AAA, HTN, hypothyroidism, CAD, CHF who lives alone. She fell in the middle of the night and was brought to the ED the next day after being found on the floor by her daughter. On exam she is found to have cellulitis of her left leg. She was recently admitted for the same. She is severely deconditioned and has lost a great deal of weight due to lack of appetite. She spend most of her day in bed, sleeping. She has tried an appetite stimulant in the past which was ineffective. They have requested a palliative care consult to discuss goals of care.    PT Comments    Pt progressing with mobility, she was able to take a few steps with RW and mod assist today. She reports significant R hip pain x 2-3 days, pain noted with R hip flexion ROM and weightbearing on RLE, R hip is minimally internally rotated, she has h/o R THA and fell prior to admission. Consider imaging of R hip.  Discussed this with RN who will contact MD.    Follow Up Recommendations  SNF;Supervision/Assistance - 24 hour     Equipment Recommendations  None recommended by PT    Recommendations for Other Services       Precautions / Restrictions Precautions Precautions: Fall Precaution Comments: "slid to floor" per daughter just prior to admission Restrictions Weight Bearing Restrictions: No    Mobility  Bed Mobility Overal bed mobility: Needs Assistance Bed Mobility: Rolling;Sidelying to Sit Rolling: Mod assist Sidelying to sit: Mod assist       General bed mobility comments: Assist for LEs and elevation of trunk, assist to initiate roll, verbal cues for technique  Transfers Overall transfer level: Needs assistance Equipment used: Rolling walker (2 wheeled) Transfers: Sit to/from [facility] Sit to Stand:  Mod assist;+2 safety/equipment Stand pivot transfers: Mod assist;+2 safety/equipment       General transfer comment: MOd A to rise, pt able to take 2-3 pivotal steps with RW to [facility] then to recliner, she reported pain in R hip with WB, kyphotic posture, increased time; total assist for pericare, pt able to stand with RW for 90 sec with min/guard A for pericare  Ambulation/Gait                 Stairs            Wheelchair Mobility    Modified Rankin (Stroke Patients Only)       Balance     Sitting balance-Leahy Scale: Fair     Standing balance support: Bilateral upper extremity supported Standing balance-Leahy Scale: Poor                      Cognition Arousal/Alertness: Awake/alert Behavior During Therapy: WFL for tasks assessed/performed Overall Cognitive Status: Within Functional Limits for tasks assessed                      Exercises General Exercises - Lower Extremity Ankle Circles/Pumps: AROM;Both;10 reps;Supine Heel Slides: AAROM;Both;10 reps;Supine Hip ABduction/ADduction: AAROM;Both;10 reps;Supine    General Comments        Pertinent Vitals/Pain Pain Assessment: Faces Faces Pain Scale: Hurts whole lot Pain Location: L lower leg and R hip with movement Pain Descriptors / Indicators: Sore Pain Intervention(s): Monitored during  session;Limited activity within patient's tolerance;Premedicated before session;Repositioned    Home Living                      Prior Function            PT Goals (current goals can now be found in the care plan section) Acute Rehab PT Goals Patient Stated Goal: to get better, to walk PT Goal Formulation: With patient/family Time For Goal Achievement: 02/12/15 Potential to Achieve Goals: Good Progress towards PT goals: Progressing toward goals    Frequency  Min 2X/week    PT Plan      Co-evaluation             End of Session Equipment Utilized During Treatment: Gait  belt;Oxygen Activity Tolerance: Patient limited by fatigue;Patient limited by pain Patient left: in chair;with call bell/phone within reach;with family/visitor present     Time: [id number] PT Time Calculation (min) (ACUTE ONLY): 38 min  Charges:  $Therapeutic Exercise: 8-22 mins $Therapeutic Activity: 23-37 mins                    G Codes:      Philomena Doheny 01/31/2015, 10:41 AM [phone]

## 2015-01-31 NOTE — Care Management Note (Signed)
Case Management Note  Patient Details  Name: [name] MRN: [id number] Date of Birth: Oct 06, 1917  Subjective/Objective:                 Patient transferred form 2C last night. Patient admitted with cellulitis and lactic acidosis. Patient met with Palliative and family preferring residential hospice, however patient condition does not support admission into residential hospice at this time. CSW following for disposition to SNF with hospice care.    Action/Plan:  No further CM needs at this time, discharge facilitated through [place].   Expected Discharge Date:                  Expected Discharge Plan:  Skilled Nursing Facility  In-House Referral:  Clinical Social Work  Discharge planning Services  CM Consult  Post Acute Care Choice:    Choice offered to:     DME Arranged:    DME Agency:     HH Arranged:    [place] Agency:     Status of Service:  Completed, signed off  Medicare Important Message Given:  Yes Date Medicare IM Given:    Medicare IM give by:    Date Additional Medicare IM Given:    Additional Medicare Important Message give by:     If discussed at [place] of Stay Meetings, dates discussed:    Additional Comments:  [name], RN 01/31/2015, 10:40 AM

## 2015-01-31 NOTE — Progress Notes (Addendum)
CSW provided bed offers to pt dtr at bedside.  They are choosing [place] for SNF with palliative care at the facility- [facility] is able to accept pt tomorrow.  Plan for pt transfer by PTAR tomorrow morning- DNR on chart to be signed for out of facility transfer  CSW will continue to follow  Domenica Reamer, [place] Worker [phone]

## 2015-01-31 NOTE — Progress Notes (Addendum)
Patient blood pressure 88/41. Patient was asymptomatic and resting peacefully with no pain or distress. MD notified, suggested parameters for blood pressure medication and/or an IV bolus. MD gave no new orders.  Barrington Ellison

## 2015-01-31 NOTE — Progress Notes (Signed)
During ambulation with PT. Pt. Had c/o pain in right hip and some issues with weight bearing. Pt. Daughter stated that she may have fallen on it. Right leg was slightly turned, but nothing significant noted. Made MD aware. Right Hip x-ray ordered. Results: No acute abnormality seen. Will continue to treat pain and monitor.

## 2015-01-31 NOTE — Clinical Social Work Note (Signed)
Clinical Social Work Assessment  Patient Details  Name: [name] MRN: [id number] Date of Birth: May 29, 1917  Date of referral:  01/31/15               Reason for consult:  Facility Placement                Permission sought to share information with:  Facility Sport and exercise psychologist, Family Supports Permission granted to share information::  No (Patient is disoriented. Spoke w/ patient's daughter, Pamala Hurry. Daughter is patient's main support.)  Name::     Pamala Hurry, Daughter  Agency::  SNF's in [facility]  Relationship::  Daughter, Kathi Der Information:  [phone]  Housing/Transportation Living arrangements for the past 2 months:  [place] of Information:  Adult Children Patient Interpreter Needed:  None Criminal Activity/Legal Involvement Pertinent to Current Situation/Hospitalization:  No - Comment as needed Significant Relationships:  Adult Children Lives with:  Self Do you feel safe going back to the place where you live?  No Need for family participation in patient care:  Yes (Comment)  Care giving concerns:  CSW met with patient and patient's daughter, Pamala Hurry, at bedside regarding PT's recommendation of SNF placement at time of discharge. Per patient's daughter, patient's daughter is unable to care for patient given current physical deconditioning.    Social Worker assessment / plan:  CSW received referral for possible SNF placement at time of discharge.   Employment status:  Retired Forensic scientist:  Medicare PT Recommendations:  [place] / Referral to community resources:  [place]  Patient/Family's Response to care:  Patient's daughter expressed understanding of PT recommendation and is agreeable to SNF placement at time of discharge. Patient's daughter requested preference for [facility] but is accepting to other facilities if [facility] is unavailable.  Patient/Family's Understanding of and  Emotional Response to Diagnosis, Current Treatment, and Prognosis:  No questions or concerns regarding plan/treatment. CSW to continue to follow and assist w/ discharge planning needs.    Emotional Assessment Appearance:  Appears stated age Attitude/Demeanor/Rapport:  Lethargic Affect (typically observed):  Quiet (Assessment completed with daughter, Pamala Hurry, since patient is disoriented.) Orientation:  Oriented to Place, Oriented to Situation, Oriented to Self Alcohol / Substance use:  Not Applicable Psych involvement (Current and /or in the community):  No (Comment)  Discharge Needs  Concerns to be addressed:  Care Coordination Readmission within the last 30 days:  No Current discharge risk:  None Barriers to Discharge:  No Barriers Identified   Benard Halsted, LCSW 01/31/2015, 11:02 AM

## 2015-01-31 NOTE — Progress Notes (Signed)
Daily Progress Note   Patient Name: [name]       Date: 01/31/2015 DOB: 19-May-1917  Age: 81 y.o. MRN#: [id number] Attending Physician: Bonnielee Haff, MD Primary Care Physician: Lauree Chandler, NP Admit Date: 01/26/2015  Reason for Consultation/Follow-up: Establishing goals of care  Subjective: [name] is a 81 y.o. female with PMH of AAA, HTN, hypothyroidism, CAD, CHF who lives alone. She fell in the middle of the night and was brought to the ED the next day after being found on the floor by her daughter. On exam she is found to have cellulitis of her left leg. She was recently admitted for the same. She is severely deconditioned and has lost a great deal of weight due to lack of appetite. She spend most of her day in bed, sleeping. She has tried an appetite stimulant in the past which was ineffective. They have requested a palliative care consult to discuss goals of care.  Interval Events: Patient on bedside commode, apparently has had loose bowel movements per daughter [name].  Patient in no distress. Patient at times eating 30-40% of her meals per daughter [name].   PLAN: Discussed extensively for a long time with the patient, daughter [name] about appropriate discharge options. Additionally, we also discussed with CSW Alvino Blood about the patient's D/C options at this time. The patient is not actively dying, she does not have signs and symptoms to think that she has entered into the dying process. She has probably weeks for her prognosis. At times, she even has a reasonable PO intake.  It is reasonable to attempt SNF, possibly a rehab attempt to gauge how much function we may be able to achieve.  SNF with palliative services seems the most reasonable D/C option based on my discussions with daughter [name] and CSW [place].  I have discussed for a long time with the patient's daughter [name]. All of her questions answered to the best of my ability. She  states that she was under the impression that the patient would be transitioning to inpatient hospice. Discussed again about end of life signs and symptoms: decreased alertness/awakeness, minimal to nil PO intake, secretions, skin changes such as mottling, which the patient is not displaying at this time.   Length of Stay: 5 days  Current Medications: Scheduled Meds:  . amiodarone  200 mg Oral Daily  . clopidogrel  75 mg Oral Daily  . doxycycline  100 mg Oral Q12H  . furosemide  20 mg Intravenous Q12H  . heparin  5,000 Units Subcutaneous 3 times per day  . lactose free nutrition  1 Container Oral Q24H  . levothyroxine  125 mcg Oral Daily  . pantoprazole  40 mg Oral Daily  . saccharomyces boulardii  250 mg Oral BID    Continuous Infusions:    PRN Meds: acetaminophen **OR** acetaminophen, albuterol, alum & mag hydroxide-simeth, loperamide, ondansetron **OR** ondansetron (ZOFRAN) IV   Vital Signs: BP 93/51 mmHg  Pulse 85  Temp(Src) 98.6 F (37 C) (Oral)  Resp 20  Ht 5\' 3"  (1.6 m)  Wt 53.2 kg (117 lb 4.6 oz)  BMI 20.78 kg/m2  SpO2 98% SpO2: SpO2: 98 % O2 Device: O2 Device: Nasal Cannula O2 Flow Rate: O2 Flow Rate (L/min): 3.5 L/min  Intake/output summary:   Intake/Output Summary (Last 24 hours) at 01/31/15 1207 Last data filed at 01/31/15 0900  Gross per 24 hour  Intake    660 ml  Output   1200 ml  Net   -540 ml   LBM:   Baseline Weight: Weight: 46.72 kg (103 lb) Most recent weight: Weight: 53.2 kg (117 lb 4.6 oz)  Physical Exam: General: Alert,sitting on bedside commode NT: No bruits, no goiter, no JVD Heart: Regular rate and rhythm. No murmur appreciated. Lungs:-Basilar crackles Abdomen: Soft, nontender, nondistended, positive bowel sounds.  Skin: Warm and dry Neuro: Grossly intact, nonfocal.        Additional Data Reviewed: Recent Labs     01/29/15  0455  01/30/15  0234  01/30/15  1415  01/31/15  0732  WBC  11.9*  10.2   --    --   HGB  9.8*  10.6*    --    --   PLT  137*  124*   --    --   NA  141  145  140  142  BUN  18  21*  20  20  CREATININE  1.58*  1.63*  1.66*  1.58*     Problem List:  Patient Active Problem List   Diagnosis Date Noted  . Acute respiratory failure with hypoxia ([place]) 01/29/2015  . Acute pulmonary edema ([place]) 01/29/2015  . SOB (shortness of breath)   . Palliative care encounter   . Sepsis ([place]) 01/26/2015  . Cellulitis of leg, left 01/26/2015  . Protein-calorie malnutrition, severe ([place]) 11/06/2014  . Pressure ulcer 11/06/2014  . Acute diastolic CHF (congestive heart failure) ([place]) 11/04/2014  . Weakness 11/04/2014  . Hypertension 11/04/2014  . Macrocytic anemia 11/04/2014  . Cardiomyopathy, ischemic 11/04/2014  . CHF exacerbation ([place]) 11/03/2014  . Body mass index (BMI) of 20.0-20.9 in adult 06/23/2014  . Hard of hearing 06/23/2014  . Rectal bleeding 07/16/2013  . Hyperlipidemia, statin intolerance 04/30/2013  . Paroxysmal atrial fibrillation ([place]) 04/30/2013  . Pacemaker - dual chamber Medtronic 2009 04/30/2013  . AAA (abdominal aortic aneurysm) ([place]) 04/30/2013  . Bilateral carotid artery stenosis 04/30/2013  . PAD (peripheral artery disease) ([place]) 04/30/2013  . Constipation 08/31/2011  . Hypotension 08/28/2011  . S/P total hip arthroplasty 08/28/2011  . CAD (coronary artery disease) 08/28/2011  . Hypothyroid 08/28/2011  . UTI (lower urinary tract infection) 08/28/2011  . Thrombocytopenia ([place]) 08/28/2011  . SSS (sick sinus syndrome) ([place]) 10/05/2007     Palliative Care Assessment & Plan    Code Status:  DNR  Goals of Care: -figuring out appropriate disposition options at this time.  Symptom Management:  Dyspnea: much improved  Pain: Reports well-controlled with Tylenol  Nausea: Continue Zofran as needed  Palliative Prophylaxis:   Bowel Regimen and Delirium Protocol  Psycho-social/Spiritual:  Desire for further Chaplaincy support: no   Prognosis: probably less than 6  months  Discharge Planning: [place] for rehab with Palliative care service follow-up    Care plan was discussed with patient, her daughter, and CSW Alvino Blood Thank you for allowing the Palliative Medicine Team to assist in the care of this patient.   Time In: 1000 Time Out: 1025 Total Time 25 Prolonged Time Billed no    Greater than 50%  of this time was spent counseling and coordinating care related to the above assessment and plan.  [id number] Loistine Chance, MD  01/31/2015, 12:07 PM  Please contact Palliative Medicine Team phone at [phone] for questions and concerns.

## 2015-01-31 NOTE — Progress Notes (Signed)
Patient's family member at bedside and informed them that patient is being transferred to 5W 10.  Report given to oncoming shift.

## 2015-01-31 NOTE — Clinical Social Work Placement (Signed)
   CLINICAL SOCIAL WORK PLACEMENT  NOTE  Date:  01/31/2015  Patient Details  Name: [name] MRN: [id number] Date of Birth: [birth year]  Clinical Social Work is seeking post-discharge placement for this patient at the [place] level of care (*CSW will initial, date and re-position this form in  chart as items are completed):  Yes   Patient/family provided with [facility] Work Department's list of facilities offering this level of care within the geographic area requested by the patient (or if unable, by the patient's family).  Yes   Patient/family informed of their freedom to choose among providers that offer the needed level of care, that participate in Medicare, Medicaid or managed care program needed by the patient, have an available bed and are willing to accept the patient.  Yes   Patient/family informed of [facility]'s ownership interest in [facility] and [facility], as well as of the fact that they are under no obligation to receive care at these facilities.  PASRR submitted to EDS on       PASRR number received on       Existing PASRR number confirmed on 01/31/15     FL2 transmitted to all facilities in geographic area requested by pt/family on 01/31/15     FL2 transmitted to all facilities within larger geographic area on       Patient informed that his/her managed care company has contracts with or will negotiate with certain facilities, including the following:        Yes   Patient/family informed of bed offers received.  Patient chooses bed at       Physician recommends and patient chooses bed at      Patient to be transferred to   on  .  Patient to be transferred to facility by       Patient family notified on   of transfer.  Name of family member notified:        PHYSICIAN       Additional Comment:    _______________________________________________ Benard Halsted, LCSW 01/31/2015, 3:17 PM

## 2015-02-01 DIAGNOSIS — L03116 Cellulitis of left lower limb: Secondary | ICD-10-CM | POA: Diagnosis not present

## 2015-02-01 DIAGNOSIS — M79605 Pain in left leg: Secondary | ICD-10-CM | POA: Diagnosis not present

## 2015-02-01 DIAGNOSIS — N183 Chronic kidney disease, stage 3 (moderate): Secondary | ICD-10-CM | POA: Diagnosis not present

## 2015-02-01 DIAGNOSIS — N3090 Cystitis, unspecified without hematuria: Secondary | ICD-10-CM | POA: Diagnosis not present

## 2015-02-01 DIAGNOSIS — I480 Paroxysmal atrial fibrillation: Secondary | ICD-10-CM | POA: Diagnosis not present

## 2015-02-01 DIAGNOSIS — I951 Orthostatic hypotension: Secondary | ICD-10-CM | POA: Diagnosis not present

## 2015-02-01 DIAGNOSIS — B3749 Other urogenital candidiasis: Secondary | ICD-10-CM | POA: Diagnosis not present

## 2015-02-01 DIAGNOSIS — E038 Other specified hypothyroidism: Secondary | ICD-10-CM | POA: Diagnosis not present

## 2015-02-01 DIAGNOSIS — R1312 Dysphagia, oropharyngeal phase: Secondary | ICD-10-CM | POA: Diagnosis not present

## 2015-02-01 DIAGNOSIS — M7989 Other specified soft tissue disorders: Secondary | ICD-10-CM | POA: Diagnosis not present

## 2015-02-01 DIAGNOSIS — Z950 Presence of cardiac pacemaker: Secondary | ICD-10-CM | POA: Diagnosis not present

## 2015-02-01 DIAGNOSIS — Z7189 Other specified counseling: Secondary | ICD-10-CM | POA: Diagnosis not present

## 2015-02-01 DIAGNOSIS — R1314 Dysphagia, pharyngoesophageal phase: Secondary | ICD-10-CM | POA: Diagnosis not present

## 2015-02-01 DIAGNOSIS — M6281 Muscle weakness (generalized): Secondary | ICD-10-CM | POA: Diagnosis not present

## 2015-02-01 DIAGNOSIS — B9689 Other specified bacterial agents as the cause of diseases classified elsewhere: Secondary | ICD-10-CM | POA: Diagnosis not present

## 2015-02-01 DIAGNOSIS — Z9181 History of falling: Secondary | ICD-10-CM | POA: Diagnosis not present

## 2015-02-01 DIAGNOSIS — E034 Atrophy of thyroid (acquired): Secondary | ICD-10-CM | POA: Diagnosis not present

## 2015-02-01 DIAGNOSIS — J9601 Acute respiratory failure with hypoxia: Secondary | ICD-10-CM | POA: Diagnosis not present

## 2015-02-01 DIAGNOSIS — R609 Edema, unspecified: Secondary | ICD-10-CM | POA: Diagnosis not present

## 2015-02-01 DIAGNOSIS — K649 Unspecified hemorrhoids: Secondary | ICD-10-CM | POA: Diagnosis not present

## 2015-02-01 DIAGNOSIS — R259 Unspecified abnormal involuntary movements: Secondary | ICD-10-CM | POA: Diagnosis not present

## 2015-02-01 DIAGNOSIS — R2681 Unsteadiness on feet: Secondary | ICD-10-CM | POA: Diagnosis not present

## 2015-02-01 DIAGNOSIS — R531 Weakness: Secondary | ICD-10-CM | POA: Diagnosis not present

## 2015-02-01 DIAGNOSIS — I714 Abdominal aortic aneurysm, without rupture: Secondary | ICD-10-CM | POA: Diagnosis not present

## 2015-02-01 MED ORDER — ACETAMINOPHEN 325 MG PO TABS: 650.0000 mg | ORAL_TABLET | Freq: Four times a day (QID) | ORAL | Status: DC | PRN

## 2015-02-01 MED ORDER — DOXYCYCLINE HYCLATE 100 MG PO TABS: 100.0000 mg | ORAL_TABLET | Freq: Two times a day (BID) | ORAL | Status: DC

## 2015-02-01 MED ORDER — LOPERAMIDE HCL 2 MG PO CAPS: 2.0000 mg | ORAL_CAPSULE | Freq: Every day | ORAL | Status: DC | PRN

## 2015-02-01 MED ORDER — SACCHAROMYCES BOULARDII 250 MG PO CAPS: 250.0000 mg | ORAL_CAPSULE | Freq: Two times a day (BID) | ORAL | Status: DC

## 2015-02-01 NOTE — Discharge Instructions (Signed)

## 2015-02-01 NOTE — Progress Notes (Signed)
NURSING PROGRESS NOTE  [name] [id number] Discharge Data: 02/01/2015 12:03 PM Attending Provider: No att. providers found [id number], Carlos American, NP     [name] to be D/C'd Skilled nursing facility per MD order. All IV's discontinued with no bleeding noted. All belongings returned to patient for patient to take to [facility]. Attempted many times to give report to nurse. Pt transferred via PT[facility] by stretcher.   Last Vital Signs:  Blood pressure 89/52, pulse 91, temperature 98.4 F (36.9 C), temperature source Oral, resp. rate 20, height 5\' 3"  (1.6 m), weight 50.894 kg (112 lb 3.2 oz), SpO2 94 %.  Discharge Medication List   Medication List    TAKE these medications        acetaminophen 325 MG tablet  Commonly known as:  TYLENOL  Take 2 tablets (650 mg total) by mouth every 6 (six) hours as needed for mild pain (or Fever >/= 101).     amiodarone 200 MG tablet  Commonly known as:  PACERONE  TAKE 1 TABLET BY MOUTH DAILY.     Biotin 1000 MCG tablet  Take 1,000 mcg by mouth daily.     CALCIUM 600 + D PO  Take 1 tablet by mouth daily.     clopidogrel 75 MG tablet  Commonly known as:  PLAVIX  TAKE 1 TABLET (75 MG TOTAL) BY MOUTH DAILY.     doxycycline 100 MG tablet  Commonly known as:  VIBRA-TABS  Take 1 tablet (100 mg total) by mouth every 12 (twelve) hours. For 8 more days.     feeding supplement Liqd  Take 1 Container by mouth daily.     ferrous sulfate 325 (65 FE) MG tablet  Take 325 mg by mouth daily with breakfast.     furosemide 20 MG tablet  Commonly known as:  LASIX  Take 1 tablet (20 mg total) by mouth daily.     levothyroxine 125 MCG tablet  Commonly known as:  SYNTHROID, LEVOTHROID  TAKE 1 TABLET BY MOUTH EVERY DAY     loperamide 2 MG capsule  Commonly known as:  IMODIUM  Take 1 capsule (2 mg total) by mouth daily as needed for diarrhea or loose stools.     nitroGLYCERIN 0.4 MG SL tablet  Commonly known as:  NITROSTAT  Place 1 tablet (0.4  mg total) under the tongue every 5 (five) minutes as needed for chest pain (For a total of 3 tablets, if chest pain persist to call 911).     pantoprazole 40 MG tablet  Commonly known as:  PROTONIX  TAKE 1 TABLET (40 MG TOTAL) BY MOUTH DAILY.     potassium chloride SA 20 MEQ tablet  Commonly known as:  K-DUR,KLOR-CON  Take 20 mEq by mouth daily.     saccharomyces boulardii 250 MG capsule  Commonly known as:  FLORASTOR  Take 1 capsule (250 mg total) by mouth 2 (two) times daily.     vitamin B-12 1000 MCG tablet  Commonly known as:  CYANOCOBALAMIN  Take 1,000 mcg by mouth daily.         Charolette Child, RN

## 2015-02-01 NOTE — Discharge Summary (Addendum)
[facility]  Physician Discharge Summary   Patient ID: [name] MRN: [id number] DOB/AGE: 03/28/1917 81 y.o.  Admit date: 01/26/2015 Discharge date: 02/01/2015  PCP: Lauree Chandler, NP  DISCHARGE DIAGNOSES:  Principal Problem:   Cellulitis of leg, left Active Problems:   Hypotension   Thrombocytopenia (HCC)   Paroxysmal atrial fibrillation ([place])   Pacemaker - dual chamber Medtronic 2009   Hypertension   Sepsis ([place])   SOB (shortness of breath)   Palliative care encounter   Acute respiratory failure with hypoxia (HCC)   Acute pulmonary edema (HCC)   RECOMMENDATIONS FOR OUTPATIENT FOLLOW UP: 1. CBC and BMET next week. 2. Palliative medicine to evaluate and follow patient at SNF 3. To avoid rehospitalization as much as possible. If patient declines consider hospice.  DISCHARGE CONDITION: fair  Diet recommendation: Heart Healthy  Filed Weights   01/30/15 2049 01/31/15 0539 02/01/15 0525  Weight: 53.1 kg (117 lb 1 oz) 53.2 kg (117 lb 4.6 oz) 50.894 kg (112 lb 3.2 oz)    INITIAL HISTORY: Patient is a 81 year old Caucasian female with past medical history of AAA, hypertension, hypothyroidism, who presented after a fall at home. Patient underwent CT scan of the abdomen in the ER which revealed an enlarging AAA with possible extravasation of blood. Her blood pressure was in the 80s on arrival. She was given IV fluids. She was thought to have cellulitis of her left lower extremity. She was hospitalized for further management. Subsequently, patient developed fluid overload. Patient was given Lasix with partial response.   Consultations:  Palliative Medicine   HOSPITAL COURSE:   Cellulitis of left leg/sepsis She presented with lactic acidosis, leukocytosis. Patient has improved. Cellulitis is improving. She was initially placed on Vanc and Zosyn. Chnaged to oral Doxycycline. Will continue for 8 more days. Blood cultures are negative. Doppler studies negative  for DVT.   Acute hypoxic respiratory failure with pulmonary edema/possible acute diastolic CHF Patient became quite dyspneic during this hospitalization. CXR showed pulmonary edema. Likely developed from IVF given during the hospitalization versus acute diastolic CHF. Patient has improved with Lasix. Chest x-ray surprisingly does not show much improvement. However, clinically she has improved. Weight has decreased. Continue oxygen, but try to wean down.  Klebsiella in urine Unclear if she was experiencing any symptoms suggestive of UTI. Organism noted to be pansensitive. Patient has received 5 days of Zosyn, which should've treated this infection. Will discontinue foley catheter.  Hypotension Blood pressure remains borderline low. However, patient remains asymptomatic.   Thrombocytopenia  Acute on chronic possibly secondary to acute infection. Counts are stable now.  AAA Possible rupture based on CT abdomen and pelvis. No abdominal pain at this time. EDP had consulted vascular surgery, Dr. Bridgett Larsson who had recommended palliative approach and patient/family did not want any surgery or interventions. Palliative medicine to follow at SNF  Acute on CKD 3/hypokalemia Renal function appears to be close to baseline. Monitor periodically at SNF.   Mildly elevated CK and Troponin with h/o CAD Most likely due to sepsis and laying on the floor. Continue Plavix. No further workup anticipated.  History of Paroxysmal atrial fibrillation  Continue Amiodarone. Heart rate is reasonably controlled. Continue Plavix. Not a good candidate for anticoagulation due to multiple issues discussed above.   Pacemaker Dual chamber Medtronic 2009  Hypothyroidism Continue Synthroid  Mechanical fall  PT and OT is following. SNF recommended. Patient was complaining of pain in her right hip. X ray was unremarkable for acute changes. Pain control for  now.  Loose Stools Patient was initially constipated. Was given stool  softeners and laxatives with which she developed diarrhea. Mainly for comfort and per family request she was given imodium with the understanding that this was not an ideal way of dealing with the situation as she could get constipated again.  Patient is slowly improving. However, she remains deconditioned. Family has been conflicted about disposition. They want her to improve functionally, but at the same time want her to be comfortable. I have conveyed to the patient's daughter that patient does not have a good long-term prognosis. However, she does not meet criteria to go to residential hospice. Palliative medicine also held discussions with family. Finally they opted for SNF with palliative care. Patient is DNR. Discussed with daughter today.  [place] for discharge to SNF.    PERTINENT LABS:  The results of significant diagnostics from this hospitalization (including imaging, microbiology, ancillary and laboratory) are listed below for reference.    Microbiology: Recent Results (from the past 240 hour(s))  Urine culture     Status: None   Collection Time: 01/26/15 12:30 PM  Result Value Ref Range Status   Specimen Description URINE, CATHETERIZED  Final   Special Requests NONE  Final   Culture   Final    >=100,000 COLONIES/mL KLEBSIELLA PNEUMONIAE Performed at [facility]    Report Status 01/29/2015 FINAL  Final   Organism ID, Bacteria KLEBSIELLA PNEUMONIAE  Final      Susceptibility   Klebsiella pneumoniae - MIC*    AMPICILLIN >=32 RESISTANT Resistant     CEFAZOLIN <=4 SENSITIVE Sensitive     CEFTRIAXONE <=1 SENSITIVE Sensitive     CIPROFLOXACIN <=0.25 SENSITIVE Sensitive     GENTAMICIN <=1 SENSITIVE Sensitive     IMIPENEM <=0.25 SENSITIVE Sensitive     NITROFURANTOIN <=16 SENSITIVE Sensitive     TRIMETH/SULFA <=20 SENSITIVE Sensitive     AMPICILLIN/SULBACTAM 4 SENSITIVE Sensitive     PIP/TAZO <=4 SENSITIVE Sensitive     * >=100,000 COLONIES/mL KLEBSIELLA PNEUMONIAE    Blood culture (routine x 2)     Status: None   Collection Time: 01/26/15  1:05 PM  Result Value Ref Range Status   Specimen Description BLOOD LEFT ANTECUBITAL  Final   Special Requests BOTTLES DRAWN AEROBIC AND ANAEROBIC 5CC EACH  Final   Culture   Final    NO GROWTH 5 DAYS Performed at [facility]    Report Status 01/31/2015 FINAL  Final  Blood culture (routine x 2)     Status: None   Collection Time: 01/26/15  1:50 PM  Result Value Ref Range Status   Specimen Description BLOOD LEFT WRIST  Final   Special Requests IN PEDIATRIC BOTTLE 3CC  Final   Culture   Final    NO GROWTH 5 DAYS Performed at [facility]    Report Status 01/31/2015 FINAL  Final  MRSA PCR Screening     Status: None   Collection Time: 01/26/15  4:00 PM  Result Value Ref Range Status   MRSA by PCR NEGATIVE NEGATIVE Final    Comment:        The GeneXpert MRSA Assay (FDA approved for NASAL specimens only), is one component of a comprehensive MRSA colonization surveillance program. It is not intended to diagnose MRSA infection nor to guide or monitor treatment for MRSA infections.      Labs: Basic Metabolic Panel:  Recent Labs Lab 01/28/15 0720 01/29/15 0455 01/30/15 0234 01/30/15 0530 01/30/15 1415 01/31/15  0732  NA 141 141 145  --  140 142  K 5.1 3.6 2.5*  --  2.9* 3.8  CL 112* 107 101  --  99* 100*  CO2 24 22 32  --  32 35*  GLUCOSE 131* 100* 124*  --  140* 95  BUN 24* 18 21*  --  20 20  CREATININE 1.46* 1.58* 1.63*  --  1.66* 1.58*  CALCIUM 7.8* 8.0* 8.3*  --  7.8* 8.1*  MG  --   --   --  1.4*  --   --    Liver Function Tests:  Recent Labs Lab 01/26/15 1030  AST 218*  ALT 97*  ALKPHOS 72  BILITOT 1.1  PROT 6.1*  ALBUMIN 2.9*    Recent Labs Lab 01/26/15 1030  LIPASE 26   CBC:  Recent Labs Lab 01/26/15 1030 01/26/15 1705 01/27/15 0551 01/28/15 0720 01/29/15 0455 01/30/15 0234  WBC 30.4* 18.4* 14.8* 10.0 11.9* 10.2  NEUTROABS 28.6*  --   --   --    --   --   HGB 11.6* 9.5* 9.3* 9.3* 9.8* 10.6*  HCT 36.9 30.4* 30.5* 29.8* 32.3* 34.5*  MCV 98.9 98.1 100.0 100.7* 100.6* 100.3*  PLT 157 112* 115* 110* 137* 124*   Cardiac Enzymes:  Recent Labs Lab 01/26/15 1030  CKTOTAL 287*  TROPONINI 0.29*   BNP: BNP (last 3 results)  Recent Labs  11/03/14 1807 01/28/15 0935  BNP 377.4* 404.1*     IMAGING STUDIES Ct Abdomen Pelvis Wo Contrast  01/26/2015  CLINICAL DATA:  Vomiting at 2 a.m. last night. Tachypnea and hypotension. EXAM: CT ABDOMEN AND PELVIS WITHOUT CONTRAST TECHNIQUE: Multidetector CT imaging of the abdomen and pelvis was performed following the standard protocol without IV contrast. COMPARISON:  CT abdomen and pelvis 07/02/2006. FINDINGS: There small bilateral pleural effusions. No pericardial effusion. Cardiomegaly is noted. Pacing device is in place. The lungs demonstrate basilar atelectasis. Calcific coronary artery disease is seen. Densely calcified splenic artery aneurysm measuring 1.1 cm is unchanged. There is extensive aortoiliac atherosclerosis. Abdominal aortic aneurysm measuring 4.2 cm AP by 4.2 cm transverse by 4.5 cm craniocaudal is identified which had measured 2.8 cm in diameter on the prior exam. Mild haziness is seen about the aneurysm although there is some although there is some slight motion on the examination. The liver, spleen, adrenal glands and pancreas are unremarkable. There is some renal atrophy, more notable on the right, which is unchanged in appearance. A single tiny gallstone is noted there is no evidence of cholecystitis. Streak artifact in the pelvis from a right hip replacement somewhat limits visualization. There is diverticular disease appearing worst in the sigmoid without evidence of diverticulitis. The stomach, small bowel and appendix appear normal. No lytic or sclerotic bony lesion is identified. Left hip osteoarthritis is seen. Right hip replacement is in place. IMPRESSION: Interval enlargement  of an infrarenal abdominal aortic aneurysm with mild haziness about the aneurysm which could be secondary to hemorrhage. CT angiogram of the abdomen and pelvis is recommended for further evaluation. Small bilateral pleural effusions. Tiny gallstone without evidence cholecystitis. Diverticulosis without diverticulitis. Critical Value/emergent results were called by telephone at the time of interpretation on 01/26/2015 at 11:10 am to Dr. Lonia Skinner , who verbally acknowledged these results. Electronically Signed   By: Inge Rise M.D.   On: 01/26/2015 11:10   Dg Chest 2 View  01/26/2015  CLINICAL DATA:  Acute nausea and vomiting, shortness of breath, COPD, history of CHF  EXAM: CHEST  2 VIEW COMPARISON:  11/03/2014 FINDINGS: Increased thoracic kyphosis evident. Heart is enlarged without superimposed CHF or edema. No focal pneumonia, collapse or consolidation. Background COPD/ emphysema suspected. Left subclavian 2 lead pacer noted. Aorta is atherosclerotic. Bones are osteopenic. IMPRESSION: Cardiomegaly without superimposed CHF or pneumonia Background COPD/ emphysema Atherosclerosis Electronically Signed   By: Jerilynn Mages.  Shick M.D.   On: 01/26/2015 11:37   Dg Tibia/fibula Left  01/26/2015  CLINICAL DATA:  Fall, left lower extremity pain and swelling EXAM: LEFT TIBIA AND FIBULA - 2 VIEW COMPARISON:  01/26/2015 FINDINGS: Diffuse osteopenia. Atrophy of the soft tissues. No acute osseous finding or displaced fracture. Left tibia and fibula appear intact. No definite soft tissue abnormality. Peripheral atherosclerosis noted. IMPRESSION: Osteopenia.  No acute osseous finding Peripheral atherosclerosis Electronically Signed   By: Jerilynn Mages.  Shick M.D.   On: 01/26/2015 11:42   Dg Ankle Complete Left  01/26/2015  CLINICAL DATA:  Nausea at 2 a.m. last night. The patient went to the ground and was unable to get up. She was found down this morning. Left ankle pain and swelling. Initial encounter. EXAM: LEFT ANKLE COMPLETE -  3+ VIEW COMPARISON:  None. FINDINGS: No acute bony or joint abnormality is identified. Bones appear osteopenic. Mild soft tissue swelling about the ankle is noted. IMPRESSION: Mild soft tissue swelling without underlying acute bony or joint abnormality. Osteopenia. Electronically Signed   By: Inge Rise M.D.   On: 01/26/2015 11:43   Dg Chest Port 1 View  01/31/2015  CLINICAL DATA:  Pulmonary edema and shortness of Breath EXAM: PORTABLE CHEST - 1 VIEW COMPARISON:  01/28/2015 FINDINGS: Cardiac shadow is stable. A pacing device is again seen. Aortic calcifications are again noted. Bilateral pleural effusions are again seen and stable. The interstitial opacities noted on the previous exam are stable in the interval from the prior study. IMPRESSION: No significant change from the prior study. Electronically Signed   By: Inez Catalina M.D.   On: 01/31/2015 07:58   Dg Chest Port 1 View  01/28/2015  CLINICAL DATA:  Acute onset of shortness of breath. Bibasilar crackles. Initial encounter. EXAM: PORTABLE CHEST 1 VIEW COMPARISON:  Chest radiograph performed 01/26/2015 FINDINGS: Lung expansion is mildly decreased. Small bilateral pleural effusions are seen. Bibasilar airspace opacities may reflect mild interstitial edema or possibly pneumonia. No pneumothorax is identified. The cardiomediastinal silhouette is mildly enlarged. A pacemaker is noted overlying the left chest wall, with leads ending overlying the right atrium and right ventricle. No acute osseous abnormalities are seen. IMPRESSION: Lung expansion mildly decreased. Small bilateral pleural effusions seen. Bibasilar airspace opacities may reflect mild interstitial edema or possibly pneumonia. Mild cardiomegaly noted. Electronically Signed   By: Garald Balding M.D.   On: 01/28/2015 00:29   Dg Foot Complete Left  01/26/2015  CLINICAL DATA:  Left lower extremity and foot bruising and swelling. Fall. EXAM: LEFT FOOT - COMPLETE 3+ VIEW COMPARISON:   01/26/2015 FINDINGS: Bones are osteopenic. No acute osseous finding or malalignment. No definite soft tissue abnormality. Degenerative changes of the midfoot involving the navicular bone with sclerosis and joint space loss. IMPRESSION: Osteopenia and midfoot degenerative changes. No acute osseous finding. Electronically Signed   By: Jerilynn Mages.  Shick M.D.   On: 01/26/2015 11:39   Dg Hip Unilat With Pelvis 2-3 Views Right  01/31/2015  CLINICAL DATA:  Right-sided hip pain following fall 5 days ago, initial encounter EXAM: DG HIP (WITH OR WITHOUT PELVIS) 2-3V RIGHT COMPARISON:  None. FINDINGS: Right hip replacement  is noted. No acute fracture or dislocation is noted. Pelvic ring is intact. IMPRESSION: No acute abnormality seen. Electronically Signed   By: Inez Catalina M.D.   On: 01/31/2015 16:04    DISCHARGE EXAMINATION: Filed Vitals:   01/31/15 1847 01/31/15 2108 02/01/15 0525 02/01/15 0602  BP: 93/48 105/56  90/40  Pulse: 103 88  91  Temp:  97.9 F (36.6 C)  98.4 F (36.9 C)  TempSrc:  Oral    Resp:  16  20  Height:      Weight:   50.894 kg (112 lb 3.2 oz)   SpO2: 98% 100%  94%   General appearance: alert, cooperative, appears stated age and no distress Resp: diminished at bases. few crackles. Cardio: regular rate and rhythm, S1, S2 normal, no murmur, click, rub or gallop GI: soft, non-tender; bowel sounds normal; no masses,  no organomegaly Extremities: extremities normal, atraumatic, no cyanosis or edema  DISPOSITION: SNF  Discharge Instructions    Call MD for:  difficulty breathing, headache or visual disturbances    Complete by:  As directed      Call MD for:  extreme fatigue    Complete by:  As directed      Call MD for:  persistant dizziness or light-headedness    Complete by:  As directed      Call MD for:  persistant nausea and vomiting    Complete by:  As directed      Call MD for:  severe uncontrolled pain    Complete by:  As directed      Call MD for:  temperature >100.4     Complete by:  As directed      Diet - low sodium heart healthy    Complete by:  As directed      Discharge instructions    Complete by:  As directed   CBC and BMET next week. Palliative medicine to evaluate patient at SNF.  You were cared for by a hospitalist during your hospital stay. If you have any questions about your discharge medications or the care you received while you were in the hospital after you are discharged, you can call the unit and asked to speak with the hospitalist on call if the hospitalist that took care of you is not available. Once you are discharged, your primary care physician will handle any further medical issues. Please note that NO REFILLS for any discharge medications will be authorized once you are discharged, as it is imperative that you return to your primary care physician (or establish a relationship with a primary care physician if you do not have one) for your aftercare needs so that they can reassess your need for medications and monitor your lab values. If you do not have a primary care physician, you can call [phone] for a physician referral.     Increase activity slowly    Complete by:  As directed            ALLERGIES:  Allergies  Allergen Reactions  . Nubain [Nalbuphine Hcl] Anaphylaxis  . Mirtazapine     "Weakness in legs"  . Statins     Leg weakness  . Iodine Rash      Current Discharge Medication List    START taking these medications   Details  acetaminophen (TYLENOL) 325 MG tablet Take 2 tablets (650 mg total) by mouth every 6 (six) hours as needed for mild pain (or Fever >/= 101).    doxycycline (VIBRA-TABS)  100 MG tablet Take 1 tablet (100 mg total) by mouth every 12 (twelve) hours. For 8 more days.    loperamide (IMODIUM) 2 MG capsule Take 1 capsule (2 mg total) by mouth daily as needed for diarrhea or loose stools. Qty: 30 capsule, Refills: 0    saccharomyces boulardii (FLORASTOR) 250 MG capsule Take 1 capsule (250 mg total)  by mouth 2 (two) times daily.      CONTINUE these medications which have NOT CHANGED   Details  amiodarone (PACERONE) 200 MG tablet TAKE 1 TABLET BY MOUTH DAILY. Qty: 30 tablet, Refills: 4    Biotin 1000 MCG tablet Take 1,000 mcg by mouth daily.    Calcium Carb-Cholecalciferol (CALCIUM 600 + D PO) Take 1 tablet by mouth daily.    feeding supplement (BOOST HIGH PROTEIN) LIQD Take 1 Container by mouth daily.    ferrous sulfate 325 (65 FE) MG tablet Take 325 mg by mouth daily with breakfast.     furosemide (LASIX) 20 MG tablet Take 1 tablet (20 mg total) by mouth daily. Qty: 30 tablet, Refills: 3   Associated Diagnoses: Congestive heart failure, unspecified congestive heart failure chronicity, unspecified congestive heart failure type (HCC)    levothyroxine (SYNTHROID, LEVOTHROID) 125 MCG tablet TAKE 1 TABLET BY MOUTH EVERY DAY Qty: 30 tablet, Refills: 0    nitroGLYCERIN (NITROSTAT) 0.4 MG SL tablet Place 1 tablet (0.4 mg total) under the tongue every 5 (five) minutes as needed for chest pain (For a total of 3 tablets, if chest pain persist to call 911). Qty: 50 tablet, Refills: 3    pantoprazole (PROTONIX) 40 MG tablet TAKE 1 TABLET (40 MG TOTAL) BY MOUTH DAILY. Qty: 30 tablet, Refills: 3    potassium chloride SA (K-DUR,KLOR-CON) 20 MEQ tablet Take 20 mEq by mouth daily.    vitamin B-12 (CYANOCOBALAMIN) 1000 MCG tablet Take 1,000 mcg by mouth daily.    clopidogrel (PLAVIX) 75 MG tablet TAKE 1 TABLET (75 MG TOTAL) BY MOUTH DAILY. Qty: 90 tablet, Refills: 1       Follow-up Information    Follow up with EUBANKS, JESSICA K, NP. Schedule an appointment as soon as possible for a visit in 2 weeks.   Specialty:  Geriatric Medicine   Why:  post hospitalization follow up   Contact information:   [address] [place] [address] [phone]       TOTAL DISCHARGE TIME: 35 mins  [facility]  [facility] Pager [phone]  02/01/2015, 7:59 AM

## 2015-02-04 ENCOUNTER — Non-Acute Institutional Stay (SKILLED_NURSING_FACILITY): Payer: Medicare Other | Admitting: Internal Medicine

## 2015-02-04 ENCOUNTER — Encounter: Payer: Self-pay | Admitting: Internal Medicine

## 2015-02-04 DIAGNOSIS — I951 Orthostatic hypotension: Secondary | ICD-10-CM

## 2015-02-04 DIAGNOSIS — N3090 Cystitis, unspecified without hematuria: Secondary | ICD-10-CM | POA: Diagnosis not present

## 2015-02-04 DIAGNOSIS — I480 Paroxysmal atrial fibrillation: Secondary | ICD-10-CM

## 2015-02-04 DIAGNOSIS — L03116 Cellulitis of left lower limb: Secondary | ICD-10-CM

## 2015-02-04 DIAGNOSIS — N183 Chronic kidney disease, stage 3 unspecified: Secondary | ICD-10-CM

## 2015-02-04 DIAGNOSIS — E034 Atrophy of thyroid (acquired): Secondary | ICD-10-CM

## 2015-02-04 DIAGNOSIS — Z950 Presence of cardiac pacemaker: Secondary | ICD-10-CM | POA: Diagnosis not present

## 2015-02-04 DIAGNOSIS — I714 Abdominal aortic aneurysm, without rupture, unspecified: Secondary | ICD-10-CM

## 2015-02-04 DIAGNOSIS — E038 Other specified hypothyroidism: Secondary | ICD-10-CM

## 2015-02-04 DIAGNOSIS — B9689 Other specified bacterial agents as the cause of diseases classified elsewhere: Secondary | ICD-10-CM

## 2015-02-04 DIAGNOSIS — J9601 Acute respiratory failure with hypoxia: Secondary | ICD-10-CM | POA: Diagnosis not present

## 2015-02-04 NOTE — Progress Notes (Signed)
MRN: [id number] Name: [name]  Sex: female Age: 81 y.o. DOB: [birth year]  [place] #: [facility] Facility/Room:505 Level Of Care: SNF Provider: Inocencio Homes D Emergency Contacts: Extended Emergency Contact Information Primary Emergency Contact: [name],[name] Address: [address]          [place], [place] [address] [name] of [place] Phone: [phone] Mobile Phone: [phone] Relation: Daughter Secondary Emergency Contact: [name] Address: [address], [place] [address] [name] of [place] Phone: [phone] Work Phone: [phone] Mobile Phone: [phone] Relation: Daughter  Code Status:   Allergies: Nubain; Mirtazapine; Statins; and Iodine  Chief Complaint  Patient presents with  . New Admit To SNF    HPI: Patient is 81 y.o. female with AAA, hypertension, hypothyroidism, who presented after a fall at home. Patient underwent CT scan of the abdomen in the ER which revealed an enlarging AAA with possible extravasation of blood. Her blood pressure was in the 80s on arrival. She was given IV fluids. She was thought to have cellulitis of her left lower extremity. Pt was hospitalized from 11/13-19 where her cellulitis was tx'ed, pt was found to have a UTI and subsequently developed fluid overload, tx with lasix. Pt is admitted to SNF with generalized weakness. While at SNF pt will be followed for HTN, tx with lasix, PAF, tx with amiodarone and plavix and hypothyroidism, tx with synthroid.  Past Medical History  Diagnosis Date  . GI bleed 2007  . Hypertension   . TIA (transient ischemic attack)   . Hypothyroidism   . Abdominal aortic aneurysm ([place])   . CAD (coronary artery disease)   . SSS (sick sinus syndrome) ([facility]) 10/05/2007    Medtronic Adapta  . Myocardial infarction ([place])   . Atrial fibrillation ([place])   . COPD (chronic obstructive pulmonary disease) ([place])   . Asthma     hx  . Anemia     hx  . Blood transfusion    . UTI (lower urinary tract infection)     hx  . Renal failure, chronic   . H/O hiatal hernia   . GERD (gastroesophageal reflux disease)   . Stroke ([place])   . Osteoporosis   . Blood transfusion without reported diagnosis   . Pacemaker     Past Surgical History  Procedure Laterality Date  . Pacemaker insertion  10/05/2007    Medtronic Adapta  . Coronary stent placement      x9  . Cardiac catheterization    . Fracture surgery      femur fx, /w ORIF into HIP- 2008  . Total hip arthroplasty      planned for 08/27/2011 Left  . Total hip arthroplasty  08/27/2011    Procedure: TOTAL HIP ARTHROPLASTY;  Surgeon: Kerin Salen, MD;  Location: [facility];  Service: Orthopedics;  Laterality: Right;  . US echocardiography  08/23/2011    EF 50%,LA mod. dilated,mild to mod. mitral annular ca+,mod. MR,mild to mod TR,mild to mod. PH,trace AI.  [name] Nm myocar perf wall motion  10/08/2010    mild apical anterior ischemia  . Colonoscopy        Medication List       This list is accurate as of: 02/04/15 11:59 PM.  Always use your most recent med list.               acetaminophen 325 MG tablet  Commonly known as:  TYLENOL  Take 2 tablets (650  mg total) by mouth every 6 (six) hours as needed for mild pain (or Fever >/= 101).     amiodarone 200 MG tablet  Commonly known as:  PACERONE  TAKE 1 TABLET BY MOUTH DAILY.     Biotin 1000 MCG tablet  Take 1,000 mcg by mouth daily.     CALCIUM 600 + D PO  Take 1 tablet by mouth daily.     clopidogrel 75 MG tablet  Commonly known as:  PLAVIX  TAKE 1 TABLET (75 MG TOTAL) BY MOUTH DAILY.     doxycycline 100 MG tablet  Commonly known as:  VIBRA-TABS  Take 1 tablet (100 mg total) by mouth every 12 (twelve) hours. For 8 more days.     feeding supplement Liqd  Take 1 Container by mouth daily.     ferrous sulfate 325 (65 FE) MG tablet  Take 325 mg by mouth daily with breakfast.     furosemide 20 MG tablet  Commonly known as:  LASIX  Take 1 tablet (20  mg total) by mouth daily.     levothyroxine 125 MCG tablet  Commonly known as:  SYNTHROID, LEVOTHROID  TAKE 1 TABLET BY MOUTH EVERY DAY     loperamide 2 MG capsule  Commonly known as:  IMODIUM  Take 1 capsule (2 mg total) by mouth daily as needed for diarrhea or loose stools.     nitroGLYCERIN 0.4 MG SL tablet  Commonly known as:  NITROSTAT  Place 1 tablet (0.4 mg total) under the tongue every 5 (five) minutes as needed for chest pain (For a total of 3 tablets, if chest pain persist to call 911).     pantoprazole 40 MG tablet  Commonly known as:  PROTONIX  TAKE 1 TABLET (40 MG TOTAL) BY MOUTH DAILY.     potassium chloride SA 20 MEQ tablet  Commonly known as:  K-DUR,KLOR-CON  Take 20 mEq by mouth daily.     saccharomyces boulardii 250 MG capsule  Commonly known as:  FLORASTOR  Take 1 capsule (250 mg total) by mouth 2 (two) times daily.     vitamin B-12 1000 MCG tablet  Commonly known as:  CYANOCOBALAMIN  Take 1,000 mcg by mouth daily.        No orders of the defined types were placed in this encounter.    Immunization History  Administered Date(s) Administered  . Influenza,inj,Quad PF,36+ Mos 12/18/2013, 12/09/2014  . Pneumococcal Conjugate-13 12/09/2014    Social History  Substance Use Topics  . Smoking status: Former Smoker    Types: Cigarettes  . Smokeless tobacco: Never Used     Comment: quit 40 yrs ago- was smoking 2 packs a day at the most  . Alcohol Use: No    Family history is + CA, HD   Review of Systems  DATA OBTAINED: from patient, nurse, medical record, daughter GENERAL:  no fevers, fatigue, appetite changes SKIN: No itching, rash or wounds EYES: No eye pain, redness, discharge EARS: No earache, tinnitus, very HOH NOSE: No congestion, drainage or bleeding  MOUTH/THROAT: No mouth or tooth pain, No sore throat RESPIRATORY: No cough, wheezing, SOB CARDIAC: No chest pain, palpitations, lower extremity edema  GI: No abdominal pain, No N/V/D or  constipation, No heartburn or reflux  GU: No dysuria, frequency or urgency, or incontinence  MUSCULOSKELETAL: No unrelieved bone/joint pain NEUROLOGIC: No headache, dizziness or focal weakness PSYCHIATRIC: No c/o anxiety or sadness   Filed Vitals:   02/04/15 1512  BP: 91/53  Pulse: 72  Temp: 97.2 F (36.2 C)  Resp: 20    SpO2 Readings from Last 1 Encounters:  02/01/15 94%        Physical Exam  GENERAL APPEARANCE: Alert, conversant,  No acute distress.  SKIN: No diaphoresis rash HEAD: Normocephalic, atraumatic  EYES: Conjunctiva/lids clear. Pupils round, reactive. EOMs intact.  EARS: External exam WNL, canals clear; HOH  NOSE: No deformity or discharge.  MOUTH/THROAT: Lips w/o lesions  RESPIRATORY: Breathing is even, unlabored. Lung sounds are clear   CARDIOVASCULAR: Heart RRR no murmurs, rubs or gallops. No peripheral edema.   GASTROINTESTINAL: Abdomen is soft, non-tender, not distended w/ normal bowel sounds. GENITOURINARY: Bladder non tender, not distended  MUSCULOSKELETAL: No abnormal joints or musculature NEUROLOGIC:  Cranial nerves 2-12 grossly intact. Moves all extremities  PSYCHIATRIC: Mood and affect appropriate to situation, no behavioral issues  Patient Active Problem List   Diagnosis Date Noted  . CKD (chronic kidney disease) stage 3, GFR 30-59 ml/min 02/06/2015  . Acute respiratory failure with hypoxia ([place]) 01/29/2015  . Acute pulmonary edema ([place]) 01/29/2015  . SOB (shortness of breath)   . Palliative care encounter   . Sepsis ([place]) 01/26/2015  . Cellulitis of leg, left 01/26/2015  . Protein-calorie malnutrition, severe ([facility]) 11/06/2014  . Pressure ulcer 11/06/2014  . Acute diastolic CHF (congestive heart failure) ([place]) 11/04/2014  . Weakness 11/04/2014  . Hypertension 11/04/2014  . Macrocytic anemia 11/04/2014  . Cardiomyopathy, ischemic 11/04/2014  . CHF exacerbation ([place]) 11/03/2014  . Body mass index (BMI) of 20.0-20.9 in adult 06/23/2014  .  Hard of hearing 06/23/2014  . Rectal bleeding 07/16/2013  . Hyperlipidemia, statin intolerance 04/30/2013  . Paroxysmal atrial fibrillation ([place]) 04/30/2013  . Pacemaker - dual chamber Medtronic 2009 04/30/2013  . AAA (abdominal aortic aneurysm) ([place]) 04/30/2013  . Bilateral carotid artery stenosis 04/30/2013  . PAD (peripheral artery disease) ([place]) 04/30/2013  . Constipation 08/31/2011  . Hypotension 08/28/2011  . S/P total hip arthroplasty 08/28/2011  . CAD (coronary artery disease) 08/28/2011  . Hypothyroid 08/28/2011  . Klebsiella cystitis 08/28/2011  . Thrombocytopenia ([place]) 08/28/2011  . SSS (sick sinus syndrome) ([facility]) 10/05/2007    CBC    Component Value Date/Time   WBC 10.2 01/30/2015 0234   WBC 6.4 08/01/2014 1401   RBC 3.44* 01/30/2015 0234   RBC 3.74* 08/01/2014 1401   HGB 10.6* 01/30/2015 0234   HCT 34.5* 01/30/2015 0234   HCT 38.0 08/01/2014 1401   PLT 124* 01/30/2015 0234   MCV 100.3* 01/30/2015 0234   LYMPHSABS 0.6* 01/26/2015 1030   LYMPHSABS 1.4 08/01/2014 1401   MONOABS 1.2* 01/26/2015 1030   EOSABS 0.0 01/26/2015 1030   BASOSABS 0.0 01/26/2015 1030   BASOSABS 0.0 08/01/2014 1401    CMP     Component Value Date/Time   NA 142 01/31/2015 0732   NA 144 08/01/2014 1401   K 3.8 01/31/2015 0732   CL 100* 01/31/2015 0732   CO2 35* 01/31/2015 0732   GLUCOSE 95 01/31/2015 0732   GLUCOSE 88 08/01/2014 1401   BUN 20 01/31/2015 0732   BUN 24 08/01/2014 1401   CREATININE 1.58* 01/31/2015 0732   CREATININE 1.16* 01/22/2015 1203   CALCIUM 8.1* 01/31/2015 0732   PROT 6.1* 01/26/2015 1030   PROT 6.0 08/01/2014 1401   ALBUMIN 2.9* 01/26/2015 1030   ALBUMIN 3.9 08/01/2014 1401   AST 218* 01/26/2015 1030   ALT 97* 01/26/2015 1030   ALKPHOS 72 01/26/2015 1030   BILITOT 1.1 01/26/2015 1030  BILITOT 0.3 08/01/2014 1401   GFRNONAA 27* 01/31/2015 0732   GFRAA 31* 01/31/2015 0732    Lab Results  Component Value Date   HGBA1C  02/27/2007    5.8 (NOTE)    The ADA recommends the following therapeutic goals for glycemic   control related to Hgb A1C measurement:   Goal of Therapy:   < 7.0% Hgb A1C   Action Suggested:  > 8.0% Hgb A1C   Ref:  Diabetes Care, 22, Suppl. 1, 1999     Ct Abdomen Pelvis Wo Contrast  01/26/2015  CLINICAL DATA:  Vomiting at 2 a.m. last night. Tachypnea and hypotension. EXAM: CT ABDOMEN AND PELVIS WITHOUT CONTRAST TECHNIQUE: Multidetector CT imaging of the abdomen and pelvis was performed following the standard protocol without IV contrast. COMPARISON:  CT abdomen and pelvis 07/02/2006. FINDINGS: There small bilateral pleural effusions. No pericardial effusion. Cardiomegaly is noted. Pacing device is in place. The lungs demonstrate basilar atelectasis. Calcific coronary artery disease is seen. Densely calcified splenic artery aneurysm measuring 1.1 cm is unchanged. There is extensive aortoiliac atherosclerosis. Abdominal aortic aneurysm measuring 4.2 cm AP by 4.2 cm transverse by 4.5 cm craniocaudal is identified which had measured 2.8 cm in diameter on the prior exam. Mild haziness is seen about the aneurysm although there is some although there is some slight motion on the examination. The liver, spleen, adrenal glands and pancreas are unremarkable. There is some renal atrophy, more notable on the right, which is unchanged in appearance. A single tiny gallstone is noted there is no evidence of cholecystitis. Streak artifact in the pelvis from a right hip replacement somewhat limits visualization. There is diverticular disease appearing worst in the sigmoid without evidence of diverticulitis. The stomach, small bowel and appendix appear normal. No lytic or sclerotic bony lesion is identified. Left hip osteoarthritis is seen. Right hip replacement is in place. IMPRESSION: Interval enlargement of an infrarenal abdominal aortic aneurysm with mild haziness about the aneurysm which could be secondary to hemorrhage. CT angiogram of the abdomen and  pelvis is recommended for further evaluation. Small bilateral pleural effusions. Tiny gallstone without evidence cholecystitis. Diverticulosis without diverticulitis. Critical Value/emergent results were called by telephone at the time of interpretation on 01/26/2015 at 11:10 am to Dr. Lonia Skinner , who verbally acknowledged these results. Electronically Signed   By: Inge Rise M.D.   On: 01/26/2015 11:10   Dg Chest 2 View  01/26/2015  CLINICAL DATA:  Acute nausea and vomiting, shortness of breath, COPD, history of CHF EXAM: CHEST  2 VIEW COMPARISON:  11/03/2014 FINDINGS: Increased thoracic kyphosis evident. Heart is enlarged without superimposed CHF or edema. No focal pneumonia, collapse or consolidation. Background COPD/ emphysema suspected. Left subclavian 2 lead pacer noted. Aorta is atherosclerotic. Bones are osteopenic. IMPRESSION: Cardiomegaly without superimposed CHF or pneumonia Background COPD/ emphysema Atherosclerosis Electronically Signed   By: Jerilynn Mages.  Shick M.D.   On: 01/26/2015 11:37   Dg Tibia/fibula Left  01/26/2015  CLINICAL DATA:  Fall, left lower extremity pain and swelling EXAM: LEFT TIBIA AND FIBULA - 2 VIEW COMPARISON:  01/26/2015 FINDINGS: Diffuse osteopenia. Atrophy of the soft tissues. No acute osseous finding or displaced fracture. Left tibia and fibula appear intact. No definite soft tissue abnormality. Peripheral atherosclerosis noted. IMPRESSION: Osteopenia.  No acute osseous finding Peripheral atherosclerosis Electronically Signed   By: Jerilynn Mages.  Shick M.D.   On: 01/26/2015 11:42   Dg Ankle Complete Left  01/26/2015  CLINICAL DATA:  Nausea at 2 a.m. last night. The patient  went to the ground and was unable to get up. She was found down this morning. Left ankle pain and swelling. Initial encounter. EXAM: LEFT ANKLE COMPLETE - 3+ VIEW COMPARISON:  None. FINDINGS: No acute bony or joint abnormality is identified. Bones appear osteopenic. Mild soft tissue swelling about the ankle  is noted. IMPRESSION: Mild soft tissue swelling without underlying acute bony or joint abnormality. Osteopenia. Electronically Signed   By: Inge Rise M.D.   On: 01/26/2015 11:43   Dg Foot Complete Left  01/26/2015  CLINICAL DATA:  Left lower extremity and foot bruising and swelling. Fall. EXAM: LEFT FOOT - COMPLETE 3+ VIEW COMPARISON:  01/26/2015 FINDINGS: Bones are osteopenic. No acute osseous finding or malalignment. No definite soft tissue abnormality. Degenerative changes of the midfoot involving the navicular bone with sclerosis and joint space loss. IMPRESSION: Osteopenia and midfoot degenerative changes. No acute osseous finding. Electronically Signed   By: Jerilynn Mages.  Shick M.D.   On: 01/26/2015 11:39    Not all labs, radiology exams or other studies done during hospitalization come through on my EPIC note; however they are reviewed by me.    Assessment and Plan  Cellulitis of leg, left presented with lactic acidosis, leukocytosis. Patient has improved. Cellulitis is improving. She was initially placed on Vanc and Zosyn. Chnaged to oral Doxycycline. Will continue for 8 more days. Blood cultures are negative. Doppler studies negative for DVT. SNF - doxycycline BID for 8 more days   Acute respiratory failure with hypoxia [facility]) Patient became quite dyspneic during this hospitalization. CXR showed pulmonary edema. Likely developed from IVF given during the hospitalization versus acute diastolic CHF. Patient has improved with Lasix. Chest x-ray surprisingly does not show much improvement. However, clinically she has improved. Weight has decreased.SNF - Continue oxygen, but try to wean down.  Klebsiella cystitis Unclear if she was experiencing any symptoms suggestive of UTI. Organism noted to be pansensitive. Patient has received 5 days of Zosyn, which should've treated this infection. Will discontinue foley catheter  Hypotension Blood pressure remains borderline low. However, patient  remains asymptomatic.    Hypertension SNF - low but stable; cont lasix 20 mg daily  AAA (abdominal aortic aneurysm) Possible rupture based on CT abdomen and pelvis. No abdominal pain at this time. EDP had consulted vascular surgery, Dr. Bridgett Larsson who had recommended palliative approach and patient/family did not want any surgery or interventions. Palliative medicine to follow at SNF- palliative care has been consulted  Paroxysmal atrial fibrillation ([place]) SNF -Continue Amiodarone. Heart rate is reasonably controlled. Continue Plavix. Not a good candidate for anticoagulation due to multiple issues discussed above.    CKD (chronic kidney disease) stage 3, GFR 30-59 ml/min SNF - Cr at baseline; will moniotor periodically  Hypothyroid SNF - TSH nl, synthroid 125 mcg daily   Time spent 45 min;> 50% of time with patient was spent reviewing records, labs, tests and studies, counseling and developing plan of care  Hennie Duos, MD

## 2015-02-05 DIAGNOSIS — R531 Weakness: Secondary | ICD-10-CM | POA: Diagnosis not present

## 2015-02-06 ENCOUNTER — Other Ambulatory Visit: Payer: Self-pay | Admitting: Cardiovascular Disease

## 2015-02-06 ENCOUNTER — Encounter: Payer: Self-pay | Admitting: Internal Medicine

## 2015-02-06 DIAGNOSIS — N183 Chronic kidney disease, stage 3 unspecified: Secondary | ICD-10-CM | POA: Insufficient documentation

## 2015-02-06 NOTE — Assessment & Plan Note (Signed)
Possible rupture based on CT abdomen and pelvis. No abdominal pain at this time. EDP had consulted vascular surgery, Dr. Bridgett Larsson who had recommended palliative approach and patient/family did not want any surgery or interventions. Palliative medicine to follow at SNF- palliative care has been consulted

## 2015-02-06 NOTE — Assessment & Plan Note (Signed)
Blood pressure remains borderline low. However, patient remains asymptomatic.

## 2015-02-06 NOTE — Assessment & Plan Note (Signed)
Unclear if she was experiencing any symptoms suggestive of UTI. Organism noted to be pansensitive. Patient has received 5 days of Zosyn, which should've treated this infection. Will discontinue foley catheter

## 2015-02-06 NOTE — Assessment & Plan Note (Signed)
SNF -Continue Amiodarone. Heart rate is reasonably controlled. Continue Plavix. Not a good candidate for anticoagulation due to multiple issues discussed above.

## 2015-02-06 NOTE — Assessment & Plan Note (Signed)
Patient became quite dyspneic during this hospitalization. CXR showed pulmonary edema. Likely developed from IVF given during the hospitalization versus acute diastolic CHF. Patient has improved with Lasix. Chest x-ray surprisingly does not show much improvement. However, clinically she has improved. Weight has decreased.SNF - Continue oxygen, but try to wean down.

## 2015-02-06 NOTE — Assessment & Plan Note (Signed)
SNF - low but stable; cont lasix 20 mg daily

## 2015-02-06 NOTE — Assessment & Plan Note (Signed)
SNF - TSH nl, synthroid 125 mcg daily

## 2015-02-06 NOTE — Assessment & Plan Note (Signed)
presented with lactic acidosis, leukocytosis. Patient has improved. Cellulitis is improving. She was initially placed on Vanc and Zosyn. Chnaged to oral Doxycycline. Will continue for 8 more days. Blood cultures are negative. Doppler studies negative for DVT. SNF - doxycycline BID for 8 more days

## 2015-02-06 NOTE — Assessment & Plan Note (Signed)
SNF - Cr at baseline; will moniotor periodically

## 2015-02-11 LAB — CUP PACEART INCLINIC DEVICE CHECK
Battery Remaining Longevity: 12 mo
Battery Voltage: 2.71 V
Brady Statistic AS VP Percent: 0 %
Date Time Interrogation Session: 20161109150546
Implantable Lead Implant Date: 20090723
Implantable Lead Location: 753859
Implantable Lead Model: 5076
Implantable Lead Model: 5076
Lead Channel Pacing Threshold Amplitude: 0.75 V
Lead Channel Pacing Threshold Pulse Width: 0.4 ms
Lead Channel Pacing Threshold Pulse Width: 0.4 ms
Lead Channel Setting Pacing Amplitude: 2 V
Lead Channel Setting Pacing Pulse Width: 0.4 ms
Lead Channel Setting Sensing Sensitivity: 5.6 mV
MDC IDC LEAD IMPLANT DT: 20090723
MDC IDC LEAD LOCATION: 753860
MDC IDC MSMT BATTERY IMPEDANCE: 3833 Ohm
MDC IDC MSMT LEADCHNL RA IMPEDANCE VALUE: 486 Ohm
MDC IDC MSMT LEADCHNL RA PACING THRESHOLD AMPLITUDE: 0.75 V
MDC IDC MSMT LEADCHNL RV IMPEDANCE VALUE: 510 Ohm
MDC IDC MSMT LEADCHNL RV SENSING INTR AMPL: 8 mV
MDC IDC SET LEADCHNL RA PACING AMPLITUDE: 1.5 V
MDC IDC STAT BRADY AP VP PERCENT: 2 %
MDC IDC STAT BRADY AP VS PERCENT: 97 %
MDC IDC STAT BRADY AS VS PERCENT: 1 %

## 2015-02-12 ENCOUNTER — Non-Acute Institutional Stay (SKILLED_NURSING_FACILITY): Payer: Medicare Other | Admitting: Internal Medicine

## 2015-02-12 DIAGNOSIS — K649 Unspecified hemorrhoids: Secondary | ICD-10-CM | POA: Diagnosis not present

## 2015-02-12 DIAGNOSIS — Z7189 Other specified counseling: Secondary | ICD-10-CM | POA: Diagnosis not present

## 2015-02-12 DIAGNOSIS — B3749 Other urogenital candidiasis: Secondary | ICD-10-CM | POA: Diagnosis not present

## 2015-02-12 DIAGNOSIS — M79605 Pain in left leg: Secondary | ICD-10-CM

## 2015-02-12 DIAGNOSIS — M7989 Other specified soft tissue disorders: Secondary | ICD-10-CM

## 2015-02-12 NOTE — Progress Notes (Signed)
MRN: [id number] Name: [name]  Sex: female Age: 81 y.o. DOB: 03/13/1918  [place] [facility]: [name] Facility/Room:101 Level Of Care: SNF Provider: Inocencio Homes [name] Emergency Contacts: Extended Emergency Contact Information Primary Emergency Contact: [name] Address: [address]          [place], [place] [address] [name] Phone: [phone] Mobile Phone: [phone] Relation: Daughter Secondary Emergency Contact: [name] Address: [place], [place] [address] [name] Phone: [phone] Work Phone: [phone] Mobile Phone: [phone] Relation: Daughter  Code Status:   Allergies: Nubain; Mirtazapine; Statins; and Iodine  Chief Complaint  Patient presents with  . Acute Visit    HPI: Patient is 81 y.o. female with AAA, hypertension, hypothyroidism who was recently hospitalized for LLE cellulitis who would like to see me for several acute issues. Pt's daughter is here. Most pressing issue is swelling of LLE onset 2 days ago. Pt admits some pain at rest;she doesn't walk. No CP or SOB, although still requiring O2. Pt also c/o redness and itching in GU area. Pt also c/o hemorrhoids and she doesn't have her preparation H.  Past Medical History  Diagnosis Date  . GI bleed 2007  . Hypertension   . TIA (transient ischemic attack)   . Hypothyroidism   . Abdominal aortic aneurysm ([place])   . CAD (coronary artery disease)   . SSS (sick sinus syndrome) ([place]) 10/05/2007    Medtronic Adapta  . Myocardial infarction ([place])   . Atrial fibrillation ([place])   . COPD (chronic obstructive pulmonary disease) ([place])   . Asthma     hx  . Anemia     hx  . Blood transfusion   . UTI (lower urinary tract infection)     hx  . Renal failure, chronic   . H/O hiatal hernia   . GERD (gastroesophageal reflux disease)   . Stroke ([place])   . Osteoporosis   . Blood transfusion without reported diagnosis   . Pacemaker      Past Surgical History  Procedure Laterality Date  . Pacemaker insertion  10/05/2007    Medtronic Adapta  . Coronary stent placement      x9  . Cardiac catheterization    . Fracture surgery      femur fx, /w ORIF into HIP- 2008  . Total hip arthroplasty      planned for 08/27/2011 Left  . Total hip arthroplasty  08/27/2011    Procedure: TOTAL HIP ARTHROPLASTY;  Surgeon: Brandy Salen, MD;  Location: [facility];  Service: Orthopedics;  Laterality: Right;  . US echocardiography  08/23/2011    EF 50%,LA mod. dilated,mild to mod. mitral annular ca+,mod. MR,mild to mod TR,mild to mod. PH,trace AI.  [name] Nm myocar perf wall motion  10/08/2010    mild apical anterior ischemia  . Colonoscopy        Medication List       This list is accurate as of: 02/12/15 11:59 PM.  Always use your most recent med list.               acetaminophen 325 MG tablet  Commonly known as:  TYLENOL  Take 2 tablets (650 mg total) by mouth every 6 (six) hours as needed for mild pain (or Fever >/= 101).     amiodarone 200 MG tablet  Commonly known as:  PACERONE  TAKE 1 TABLET BY MOUTH EVERY DAY  Biotin 1000 MCG tablet  Take 1,000 mcg by mouth daily.     CALCIUM 600 + [name] PO  Take 1 tablet by mouth daily.     clopidogrel 75 MG tablet  Commonly known as:  PLAVIX  TAKE 1 TABLET (75 MG TOTAL) BY MOUTH DAILY.     doxycycline 100 MG tablet  Commonly known as:  VIBRA-TABS  Take 1 tablet (100 mg total) by mouth every 12 (twelve) hours. For 8 more days.     feeding supplement Liqd  Take 1 Container by mouth daily.     ferrous sulfate 325 (65 FE) MG tablet  Take 325 mg by mouth daily with breakfast.     furosemide 20 MG tablet  Commonly known as:  LASIX  Take 1 tablet (20 mg total) by mouth daily.     levothyroxine 125 MCG tablet  Commonly known as:  SYNTHROID, LEVOTHROID  TAKE 1 TABLET BY MOUTH EVERY DAY     loperamide 2 MG capsule  Commonly known as:  IMODIUM  Take 1 capsule (2 mg total) by mouth  daily as needed for diarrhea or loose stools.     nitroGLYCERIN 0.4 MG SL tablet  Commonly known as:  NITROSTAT  Place 1 tablet (0.4 mg total) under the tongue every 5 (five) minutes as needed for chest pain (For a total of 3 tablets, if chest pain persist to call 911).     pantoprazole 40 MG tablet  Commonly known as:  PROTONIX  TAKE 1 TABLET (40 MG TOTAL) BY MOUTH DAILY.     potassium chloride SA 20 MEQ tablet  Commonly known as:  K-DUR,KLOR-CON  Take 20 mEq by mouth daily.     saccharomyces boulardii 250 MG capsule  Commonly known as:  FLORASTOR  Take 1 capsule (250 mg total) by mouth 2 (two) times daily.     vitamin B-12 1000 MCG tablet  Commonly known as:  CYANOCOBALAMIN  Take 1,000 mcg by mouth daily.        No orders of the defined types were placed in this encounter.    Immunization History  Administered Date(s) Administered  . Influenza,inj,Quad PF,36+ Mos 12/18/2013, 12/09/2014  . Pneumococcal Conjugate-13 12/09/2014    Social History  Substance Use Topics  . Smoking status: Former Smoker    Types: Cigarettes  . Smokeless tobacco: Never Used     Comment: quit 40 yrs ago- was smoking 2 packs a day at the most  . Alcohol Use: No    Review of Systems  DATA OBTAINED: from patient, nurse, daughter - as in HPI GENERAL:  no fevers, fatigue, appetite changes SKIN: No itching, rash HEENT: No complaint RESPIRATORY: No cough, wheezing, some mild SOB CARDIAC: No chest pain, palpitations,+ L  lower extremity edema with some pain GI: No abdominal pain, No N/V/[name] or constipation, No heartburn or reflux ; c/o hemorrhoids GU: No frequency or urgency, or incontinence; redness and itching GU and upper inner legs  MUSCULOSKELETAL: No unrelieved bone/joint pain NEUROLOGIC: No headache, dizziness  PSYCHIATRIC: No overt anxiety or sadness  Filed Vitals:   02/15/15 2115  BP: 132/64  Pulse: 64  Temp: 97 F (36.1 C)  Resp: 16    Physical Exam  GENERAL APPEARANCE:  Alert, conversant, No acute distress  SKIN: No diaphoresis HEENT: pt is very HOH RESPIRATORY: Breathing is even, unlabored. Lung sounds are clear   CARDIOVASCULAR: Heart RRR no murmurs, rubs or gallops. LLE edema, especially calf, some post TTP, minimal redness, some heat  GASTROINTESTINAL: Abdomen is soft, non-tender, not distended w/ normal bowel sounds.  GENITOURINARY: Bladder non tender, not distended  MUSCULOSKELETAL: No abnormal joints or musculature NEUROLOGIC: Cranial nerves 2-12 grossly intact. Moves all extremities PSYCHIATRIC: Mood and affect appropriate to situation, no behavioral issues  Patient Active Problem List   Diagnosis Date Noted  . Pain and swelling of left lower extremity 02/15/2015  . Candidiasis of perineum 02/15/2015  . Hemorrhoids 02/15/2015  . CKD (chronic kidney disease) stage 3, GFR 30-59 ml/min 02/06/2015  . Acute respiratory failure with hypoxia ([place]) 01/29/2015  . Acute pulmonary edema ([place]) 01/29/2015  . SOB (shortness of breath)   . Palliative care encounter   . Sepsis ([place]) 01/26/2015  . Cellulitis of leg, left 01/26/2015  . Protein-calorie malnutrition, severe ([place]) 11/06/2014  . Pressure ulcer 11/06/2014  . Acute diastolic CHF (congestive heart failure) ([place]) 11/04/2014  . Weakness 11/04/2014  . Hypertensive heart disease with CHF (congestive heart failure) ([place]) 11/04/2014  . Macrocytic anemia 11/04/2014  . Cardiomyopathy, ischemic 11/04/2014  . CHF exacerbation ([place]) 11/03/2014  . Body mass index (BMI) of 20.0-20.9 in adult 06/23/2014  . Hard of hearing 06/23/2014  . Rectal bleeding 07/16/2013  . Hyperlipidemia, statin intolerance 04/30/2013  . Paroxysmal atrial fibrillation ([place]) 04/30/2013  . Pacemaker - dual chamber Medtronic 2009 04/30/2013  . AAA (abdominal aortic aneurysm) ([place]) 04/30/2013  . Bilateral carotid artery stenosis 04/30/2013  . PAD (peripheral artery disease) ([place]) 04/30/2013  . Constipation 08/31/2011  . Hypotension  08/28/2011  . S/P total hip arthroplasty 08/28/2011  . CAD (coronary artery disease) 08/28/2011  . Hypothyroid 08/28/2011  . Klebsiella cystitis 08/28/2011  . Thrombocytopenia ([place]) 08/28/2011  . SSS (sick sinus syndrome) ([facility]) 10/05/2007    CBC    Component Value Date/Time   WBC 10.2 01/30/2015 0234   WBC 6.4 08/01/2014 1401   RBC 3.44* 01/30/2015 0234   RBC 3.74* 08/01/2014 1401   HGB 10.6* 01/30/2015 0234   HCT 34.5* 01/30/2015 0234   HCT 38.0 08/01/2014 1401   PLT 124* 01/30/2015 0234   MCV 100.3* 01/30/2015 0234   LYMPHSABS 0.6* 01/26/2015 1030   LYMPHSABS 1.4 08/01/2014 1401   MONOABS 1.2* 01/26/2015 1030   EOSABS 0.0 01/26/2015 1030   BASOSABS 0.0 01/26/2015 1030   BASOSABS 0.0 08/01/2014 1401    CMP     Component Value Date/Time   NA 142 01/31/2015 0732   NA 144 08/01/2014 1401   K 3.8 01/31/2015 0732   CL 100* 01/31/2015 0732   CO2 35* 01/31/2015 0732   GLUCOSE 95 01/31/2015 0732   GLUCOSE 88 08/01/2014 1401   BUN 20 01/31/2015 0732   BUN 24 08/01/2014 1401   CREATININE 1.58* 01/31/2015 0732   CREATININE 1.16* 01/22/2015 1203   CALCIUM 8.1* 01/31/2015 0732   PROT 6.1* 01/26/2015 1030   PROT 6.0 08/01/2014 1401   ALBUMIN 2.9* 01/26/2015 1030   ALBUMIN 3.9 08/01/2014 1401   AST 218* 01/26/2015 1030   ALT 97* 01/26/2015 1030   ALKPHOS 72 01/26/2015 1030   BILITOT 1.1 01/26/2015 1030   BILITOT 0.3 08/01/2014 1401   GFRNONAA 27* 01/31/2015 0732   GFRAA 31* 01/31/2015 0732    Assessment and Plan  Spent a good deal of time discussing all situations with pt's daughter.  Pain and swelling of left lower extremity Doesn't appear to be cellulitis; DVT needs to be ruled out ; LLE U/S has been ordered  Candidiasis of perineum Pt has been treated with yeast cream prior; recent abx  use cause most likely; Will tx with diflucan 150 mg now and again in 72 hours  Hemorrhoids Have written for anusol-HC suppositories which nursing will place and the nurse will  show her how to use them.   Time spent > 35 min;> 50% of time with patient was spent reviewing records, labs, tests and studies, counseling and developing plan of care  Hennie Duos, MD

## 2015-02-15 ENCOUNTER — Encounter: Payer: Self-pay | Admitting: Internal Medicine

## 2015-02-15 DIAGNOSIS — B3749 Other urogenital candidiasis: Secondary | ICD-10-CM | POA: Insufficient documentation

## 2015-02-15 DIAGNOSIS — K649 Unspecified hemorrhoids: Secondary | ICD-10-CM | POA: Insufficient documentation

## 2015-02-15 DIAGNOSIS — M7989 Other specified soft tissue disorders: Principal | ICD-10-CM

## 2015-02-15 DIAGNOSIS — M79605 Pain in left leg: Secondary | ICD-10-CM | POA: Insufficient documentation

## 2015-02-15 NOTE — Assessment & Plan Note (Signed)
Pt has been treated with yeast cream prior; recent abx use cause most likely; Will tx with diflucan 150 mg now and again in 72 hours

## 2015-02-15 NOTE — Assessment & Plan Note (Signed)
Doesn't appear to be cellulitis; DVT needs to be ruled out ; LLE U/S has been ordered

## 2015-02-15 NOTE — Assessment & Plan Note (Signed)
Have written for anusol-HC suppositories which nursing will place and the nurse will show her how to use them.

## 2015-02-17 ENCOUNTER — Ambulatory Visit: Payer: Medicare Other | Admitting: Internal Medicine

## 2015-03-03 ENCOUNTER — Encounter: Payer: Self-pay | Admitting: Internal Medicine

## 2015-03-03 ENCOUNTER — Non-Acute Institutional Stay (SKILLED_NURSING_FACILITY): Payer: Medicare Other | Admitting: Internal Medicine

## 2015-03-03 DIAGNOSIS — J9601 Acute respiratory failure with hypoxia: Secondary | ICD-10-CM

## 2015-03-03 DIAGNOSIS — I480 Paroxysmal atrial fibrillation: Secondary | ICD-10-CM | POA: Diagnosis not present

## 2015-03-03 DIAGNOSIS — R609 Edema, unspecified: Secondary | ICD-10-CM

## 2015-03-03 DIAGNOSIS — R531 Weakness: Secondary | ICD-10-CM | POA: Diagnosis not present

## 2015-03-03 NOTE — Progress Notes (Signed)
Patient ID: [name], female   DOB: [birth year], 81 y.o.   MRN: [id number] MRN: [id number] Name: [name]  Sex: female Age: 81 y.o. DOB: 1917-10-13  [place] #: [facility] Facility/Room:101 Level Of Care: SNF Provider: Wille Celeste Emergency Contacts: Extended Emergency Contact Information Primary Emergency Contact: [name],[name] Address: [address]          [place], [place] [address] [name] of [place] Phone: [phone] Mobile Phone: [phone] Relation: Daughter Secondary Emergency Contact: [name] Address: [place],  [address] [name] of [place] Phone: [phone] Work Phone: [phone] Mobile Phone: [phone] Relation: Daughter  Code Status:   Allergies: Nubain; Mirtazapine; Statins; and Iodine  Chief Complaint  Patient presents with  . Acute Visit   secondary to discharge concerns with history of weakness recent cellulitis COPD  HPI: Patient is 81 y.o. female with AAA, hypertension, hypothyroidism  and apparently now oxygen dependent who was recently hospitalized for LLE cellulitis who is slated for discharge later this week-however patient and her family are quite concerned thinking that it is somewhat unsafe for her to go home-apparently she will be home alone for significant amount of time during the day while family will be away at work.  Apparently there are insurance issues at work here which complicates things. Patient does have oxygen desaturation with exertion and does require oxygen which complicates matters as well with her pretty significant debility and weakness she does use a walker.  She has numerous other issues including hypotension which appears to have stabilized here recent blood pressures 135/64-135/64-120/68 I do see 97/51 but this appears to be quite rare.  She also has a history of atrial fibrillation continues on amiodarone this appears to be rate controlled she is on Plavix  for anticoagulation.  In regards to cellulitis of lower leg this appears to have resolved there was concern about lower extremity edema but DVT was negative for the left leg.  In regards to acute respiratory failure she did have pulmonary edema in the hospital this was thought secondary to IV fluids she received there.  She does continue on low-dose Lasix  .  Past Medical History  Diagnosis Date  . GI bleed 2007  . Hypertension   . TIA (transient ischemic attack)   . Hypothyroidism   . Abdominal aortic aneurysm ([place])   . CAD (coronary artery disease)   . SSS (sick sinus syndrome) ([place]) 10/05/2007    Medtronic Adapta  . Myocardial infarction ([place])   . Atrial fibrillation ([place])   . COPD (chronic obstructive pulmonary disease) ([place])   . Asthma     hx  . Anemia     hx  . Blood transfusion   . UTI (lower urinary tract infection)     hx  . Renal failure, chronic   . H/O hiatal hernia   . GERD (gastroesophageal reflux disease)   . Stroke ([place])   . Osteoporosis   . Blood transfusion without reported diagnosis   . Pacemaker     Past Surgical History  Procedure Laterality Date  . Pacemaker insertion  10/05/2007    Medtronic Adapta  . Coronary stent placement      x9  . Cardiac catheterization    . Fracture surgery      femur fx, /w ORIF into HIP- 2008  . Total hip arthroplasty      planned for 08/27/2011 Left  . Total hip arthroplasty  08/27/2011    Procedure: TOTAL HIP ARTHROPLASTY;  Surgeon: Kerin Salen, MD;  Location: [facility];  Service: Orthopedics;  Laterality: Right;  . US echocardiography  08/23/2011    EF 50%,LA mod. dilated,mild to mod. mitral annular ca+,mod. MR,mild to mod TR,mild to mod. PH,trace AI.  [name] Nm myocar perf wall motion  10/08/2010    mild apical anterior ischemia  . Colonoscopy        Medication List       This list is accurate as of: 03/03/15 11:59 PM.  Always use your most recent med list.               acetaminophen 325 MG tablet   Commonly known as:  TYLENOL  Take 2 tablets (650 mg total) by mouth every 6 (six) hours as needed for mild pain (or Fever >/= 101).     amiodarone 200 MG tablet  Commonly known as:  PACERONE  TAKE 1 TABLET BY MOUTH EVERY DAY     Biotin 1000 MCG tablet  Take 1,000 mcg by mouth daily.     CALCIUM 600 + D PO  Take 1 tablet by mouth daily.     clopidogrel 75 MG tablet  Commonly known as:  PLAVIX  TAKE 1 TABLET (75 MG TOTAL) BY MOUTH DAILY.     feeding supplement Liqd  Take 1 Container by mouth daily.     ferrous sulfate 325 (65 FE) MG tablet  Take 325 mg by mouth daily with breakfast.     furosemide 20 MG tablet  Commonly known as:  LASIX  Take 1 tablet (20 mg total) by mouth daily.     levothyroxine 125 MCG tablet  Commonly known as:  SYNTHROID, LEVOTHROID  TAKE 1 TABLET BY MOUTH EVERY DAY     loperamide 2 MG capsule  Commonly known as:  IMODIUM  Take 1 capsule (2 mg total) by mouth daily as needed for diarrhea or loose stools.     nitroGLYCERIN 0.4 MG SL tablet  Commonly known as:  NITROSTAT  Place 1 tablet (0.4 mg total) under the tongue every 5 (five) minutes as needed for chest pain (For a total of 3 tablets, if chest pain persist to call 911).     pantoprazole 40 MG tablet  Commonly known as:  PROTONIX  TAKE 1 TABLET (40 MG TOTAL) BY MOUTH DAILY.     potassium chloride SA 20 MEQ tablet  Commonly known as:  K-DUR,KLOR-CON  Take 20 mEq by mouth daily.     saccharomyces boulardii 250 MG capsule  Commonly known as:  FLORASTOR  Take 1 capsule (250 mg total) by mouth 2 (two) times daily.     vitamin B-12 1000 MCG tablet  Commonly known as:  CYANOCOBALAMIN  Take 1,000 mcg by mouth daily.          Immunization History  Administered Date(s) Administered  . Influenza,inj,Quad PF,36+ Mos 12/18/2013, 12/09/2014  . Pneumococcal Conjugate-13 12/09/2014    Social History  Substance Use Topics  . Smoking status: Former Smoker    Types: Cigarettes  . Smokeless  tobacco: Never Used     Comment: quit 40 yrs ago- was smoking 2 packs a day at the most  . Alcohol Use: No    Review of Systems  DATA OBTAINED: from patient, nurse, daughter - as in HPI GENERAL:  no fevers, fatigue, appetite changes--has gained strength but still has significant weakness SKIN: No itching, rash HEENT: No complaint RESPIRATORY: No cough, wheezing,  some mild SOB with exertion CARDIAC: No chest pain, palpitations,+ L  lower extremity edema but this has gradually gone down GI: No abdominal pain, No N/V/D or constipation, No heartburn or reflux ;  GU: No frequency or urgency,perr family possibly some incontinenceat times;  MUSCULOSKELETAL: No unrelieved bone/joint pain NEUROLOGIC: No headache, dizziness  PSYCHIATRIC: No overt anxiety or sadness  Filed Vitals:   03/03/15 2050  BP: 135/63  Pulse: 89  Temp: 96.8 F (36 C)  Resp: 19   O2 saturation in the 90s on oxygen at 3 L-without oxygen on exertion her O2 saturations fall  into the 80s Physical Exam  GENERAL APPEARANCE: Alert, conversant, No acute distress  SKIN: No diaphoresis HEENT: pt is very HOH--visual acuity appears grossly intact RESPIRATORY: Breathing is even, unlabored. Lung sounds are clear with shallow air entry   CARDIOVASCULAR: Heart RRR no murmurs, rubs or gallops. LLE edema, peers to be improving GASTROINTESTINAL: Abdomen is soft, non-tender, not distended w/ normal bowel sounds.  GENITOURINARY: Bladder non tender, not distended  MUSCULOSKELETAL: No abnormal joints or musculature is able to stand and use her walker but is quite weak NEUROLOGIC: Cranial nerves 2-12 grossly intact. Moves all extremities PSYCHIATRIC: Mood and affect appropriate to situation, no behavioral issues  Patient Active Problem List   Diagnosis Date Noted  . Pain and swelling of left lower extremity 02/15/2015  . Candidiasis of perineum 02/15/2015  . Hemorrhoids 02/15/2015  . CKD (chronic kidney disease) stage 3, GFR 30-59  ml/min 02/06/2015  . Acute respiratory failure with hypoxia ([place]) 01/29/2015  . Acute pulmonary edema ([place]) 01/29/2015  . SOB (shortness of breath)   . Palliative care encounter   . Sepsis ([place]) 01/26/2015  . Cellulitis of leg, left 01/26/2015  . Protein-calorie malnutrition, severe ([place]) 11/06/2014  . Pressure ulcer 11/06/2014  . Acute diastolic CHF (congestive heart failure) ([place]) 11/04/2014  . Weakness 11/04/2014  . Hypertensive heart disease with CHF (congestive heart failure) ([place]) 11/04/2014  . Macrocytic anemia 11/04/2014  . Cardiomyopathy, ischemic 11/04/2014  . CHF exacerbation ([place]) 11/03/2014  . Body mass index (BMI) of 20.0-20.9 in adult 06/23/2014  . Hard of hearing 06/23/2014  . Rectal bleeding 07/16/2013  . Hyperlipidemia, statin intolerance 04/30/2013  . Paroxysmal atrial fibrillation ([place]) 04/30/2013  . Pacemaker - dual chamber Medtronic 2009 04/30/2013  . AAA (abdominal aortic aneurysm) ([place] [facility]) 04/30/2013  . Bilateral carotid artery stenosis 04/30/2013  . PAD (peripheral artery disease) ([place]) 04/30/2013  . Constipation 08/31/2011  . Hypotension 08/28/2011  . S/P total hip arthroplasty 08/28/2011  . CAD (coronary artery disease) 08/28/2011  . Hypothyroid 08/28/2011  . Klebsiella cystitis 08/28/2011  . Thrombocytopenia ([place]) 08/28/2011  . SSS (sick sinus syndrome) ([facility]) 10/05/2007    CBC    Component Value Date/Time   WBC 10.2 01/30/2015 0234   WBC 6.4 08/01/2014 1401   RBC 3.44* 01/30/2015 0234   RBC 3.74* 08/01/2014 1401   HGB 10.6* 01/30/2015 0234   HCT 34.5* 01/30/2015 0234   HCT 38.0 08/01/2014 1401   PLT 124* 01/30/2015 0234   MCV 100.3* 01/30/2015 0234   LYMPHSABS 0.6* 01/26/2015 1030   LYMPHSABS 1.4 08/01/2014 1401   MONOABS 1.2* 01/26/2015 1030   EOSABS 0.0 01/26/2015 1030   BASOSABS 0.0 01/26/2015 1030   BASOSABS 0.0 08/01/2014 1401    CMP     Component Value Date/Time   NA 142 01/31/2015 0732   NA 144 08/01/2014 1401   K 3.8  01/31/2015 0732   CL 100* 01/31/2015 0732  CO2 35* 01/31/2015 0732   GLUCOSE 95 01/31/2015 0732   GLUCOSE 88 08/01/2014 1401   BUN 20 01/31/2015 0732   BUN 24 08/01/2014 1401   CREATININE 1.58* 01/31/2015 0732   CREATININE 1.16* 01/22/2015 1203   CALCIUM 8.1* 01/31/2015 0732   PROT 6.1* 01/26/2015 1030   PROT 6.0 08/01/2014 1401   ALBUMIN 2.9* 01/26/2015 1030   ALBUMIN 3.9 08/01/2014 1401   AST 218* 01/26/2015 1030   ALT 97* 01/26/2015 1030   ALKPHOS 72 01/26/2015 1030   BILITOT 1.1 01/26/2015 1030   BILITOT 0.3 08/01/2014 1401   GFRNONAA 27* 01/31/2015 0732   GFRAA 31* 01/31/2015 0732    Assessment and Plan  Tentative discharge with history of lower extremity cellulitis respiratory failure atrial fibrillation and weakness-as noted above family has significant concerns about patient's safety at home patient also expresses concern here.  I did speak with the social worker as well as physical therapy-at this point will not write orders for discharge-family is working on possible ways to try to keep her in the facility at least for a while until she gets stronger-I feel this is a good idea-will order blood work including a CBC and BMP for updated values tomorrow.  Regards to her history of respiratory failure she appears to be stable this regard she is requiring oxygen on exertion.  Atrial fibrillation does appear to be rate controlled she is on amiodarone does continue on Plavix as well for anticoagulation.  For anemia we will get update a CBC she is on iron hemoglobin was 10.6 on November 17.  4 history of possible diastolic CHF she is on low-dose Lasix this appears relatively stable her left lower extremity edema appears to be improved.  Again will update a metabolic panel she is on potassium supplementation as well.  Regards hypertension this appears stable recent blood pressures 132/78-135/64-I do see 97/51 but again this appears fairly rare-.  History chronic kidney  disease creatinine was 1.58 on November 18 with a BUN of 20-will update this as well as.  Again patient does appear stable but continues to be quite weak and oxygen dependent-she does not appear to be a very good candidate to be home alone for any significant amount of time-I share the family's concern and again we will delay the discharge and hopefully matters can be worked up or she can stay a bit longer possibly would be a better candidate for assisted living as discussed with physical therapy.  [id number] note greater than 45 minutes spent assessing patient-discussing family's concerns at bedside-and discussing patient's status with social worker and physical therapy as well as with family in the room--as well as coordinating plan of care.  Greater than 50% of time spent coordinating plan a care with extensive input as noted above  .       LASSEN, ARLO C,

## 2015-03-05 DIAGNOSIS — R269 Unspecified abnormalities of gait and mobility: Secondary | ICD-10-CM | POA: Diagnosis not present

## 2015-03-06 ENCOUNTER — Ambulatory Visit: Payer: Medicare Other | Admitting: Nurse Practitioner

## 2015-03-07 ENCOUNTER — Inpatient Hospital Stay (HOSPITAL_COMMUNITY): Admission: RE | Admit: 2015-03-07 | Payer: Medicare Other | Source: Ambulatory Visit

## 2015-03-07 ENCOUNTER — Other Ambulatory Visit (HOSPITAL_COMMUNITY): Payer: Medicare Other

## 2015-03-10 ENCOUNTER — Encounter (HOSPITAL_COMMUNITY): Payer: Self-pay | Admitting: Emergency Medicine

## 2015-03-10 ENCOUNTER — Emergency Department (HOSPITAL_COMMUNITY): Payer: Medicare Other

## 2015-03-10 ENCOUNTER — Emergency Department (HOSPITAL_COMMUNITY)
Admission: EM | Admit: 2015-03-10 | Discharge: 2015-03-11 | Disposition: A | Discharge status: Home / Self Care | Payer: Medicare Other | Attending: Emergency Medicine | Admitting: Emergency Medicine

## 2015-03-10 DIAGNOSIS — Z79899 Other long term (current) drug therapy: Secondary | ICD-10-CM | POA: Diagnosis not present

## 2015-03-10 DIAGNOSIS — I129 Hypertensive chronic kidney disease with stage 1 through stage 4 chronic kidney disease, or unspecified chronic kidney disease: Secondary | ICD-10-CM | POA: Insufficient documentation

## 2015-03-10 DIAGNOSIS — Z87891 Personal history of nicotine dependence: Secondary | ICD-10-CM | POA: Insufficient documentation

## 2015-03-10 DIAGNOSIS — E039 Hypothyroidism, unspecified: Secondary | ICD-10-CM | POA: Insufficient documentation

## 2015-03-10 DIAGNOSIS — Z950 Presence of cardiac pacemaker: Secondary | ICD-10-CM | POA: Diagnosis not present

## 2015-03-10 DIAGNOSIS — I252 Old myocardial infarction: Secondary | ICD-10-CM | POA: Diagnosis not present

## 2015-03-10 DIAGNOSIS — Z87440 Personal history of urinary (tract) infections: Secondary | ICD-10-CM | POA: Insufficient documentation

## 2015-03-10 DIAGNOSIS — J449 Chronic obstructive pulmonary disease, unspecified: Secondary | ICD-10-CM | POA: Insufficient documentation

## 2015-03-10 DIAGNOSIS — Z8673 Personal history of transient ischemic attack (TIA), and cerebral infarction without residual deficits: Secondary | ICD-10-CM | POA: Insufficient documentation

## 2015-03-10 DIAGNOSIS — I2510 Atherosclerotic heart disease of native coronary artery without angina pectoris: Secondary | ICD-10-CM | POA: Insufficient documentation

## 2015-03-10 DIAGNOSIS — K219 Gastro-esophageal reflux disease without esophagitis: Secondary | ICD-10-CM | POA: Diagnosis not present

## 2015-03-10 DIAGNOSIS — N189 Chronic kidney disease, unspecified: Secondary | ICD-10-CM | POA: Diagnosis not present

## 2015-03-10 DIAGNOSIS — D649 Anemia, unspecified: Secondary | ICD-10-CM | POA: Diagnosis not present

## 2015-03-10 DIAGNOSIS — R0789 Other chest pain: Secondary | ICD-10-CM | POA: Diagnosis not present

## 2015-03-10 DIAGNOSIS — R079 Chest pain, unspecified: Secondary | ICD-10-CM | POA: Insufficient documentation

## 2015-03-10 DIAGNOSIS — Z7902 Long term (current) use of antithrombotics/antiplatelets: Secondary | ICD-10-CM | POA: Diagnosis not present

## 2015-03-10 LAB — CBC
HCT: 30.3 % — ABNORMAL LOW (ref 36.0–46.0)
Hemoglobin: 9.1 g/dL — ABNORMAL LOW (ref 12.0–15.0)
MCH: 31.5 pg (ref 26.0–34.0)
MCHC: 30 g/dL (ref 30.0–36.0)
MCV: 104.8 fL — AB (ref 78.0–100.0)
PLATELETS: 131 10*3/uL — AB (ref 150–400)
RBC: 2.89 MIL/uL — AB (ref 3.87–5.11)
RDW: 16.4 % — AB (ref 11.5–15.5)
WBC: 6 10*3/uL (ref 4.0–10.5)

## 2015-03-10 LAB — BASIC METABOLIC PANEL
Anion gap: 7 (ref 5–15)
BUN: 25 mg/dL — AB (ref 6–20)
CHLORIDE: 96 mmol/L — AB (ref 101–111)
CO2: 38 mmol/L — AB (ref 22–32)
CREATININE: 1.6 mg/dL — AB (ref 0.44–1.00)
Calcium: 8.8 mg/dL — ABNORMAL LOW (ref 8.9–10.3)
GFR calc Af Amer: 30 mL/min — ABNORMAL LOW (ref >60)
GFR calc non Af Amer: 26 mL/min — ABNORMAL LOW (ref >60)
GLUCOSE: 98 mg/dL (ref 65–99)
Potassium: 4.2 mmol/L (ref 3.5–5.1)
Sodium: 141 mmol/L (ref 135–145)

## 2015-03-10 LAB — BRAIN NATRIURETIC PEPTIDE: B Natriuretic Peptide: 182.3 pg/mL — ABNORMAL HIGH (ref 0.0–100.0)

## 2015-03-10 LAB — I-STAT TROPONIN, ED: Troponin i, poc: 0 ng/mL (ref 0.00–0.08)

## 2015-03-10 NOTE — ED Notes (Signed)
PTAR CALLED  °

## 2015-03-10 NOTE — ED Notes (Signed)
Per ems pt started having some sharp pains in the left side of her chest that would come and go starting at 0800 this morning. Pt was given 1 SL nitro tablet around 2000 this evening pt has been chest pain free since ems arrival and pt arrives to ED with no chest chest.

## 2015-03-10 NOTE — ED Provider Notes (Signed)
CSN: [id number]     Arrival date & time 03/10/15  2109 History   First MD Initiated Contact with Patient 03/10/15 2124     Chief Complaint  Patient presents with  . Chest Pain     (Consider location/radiation/quality/duration/timing/severity/associated sxs/prior Treatment) Patient is a 81 y.o. female presenting with chest pain. The history is provided by the patient and a relative.  Chest Pain Pain location:  L chest Pain quality: aching   Pain radiates to:  Does not radiate Pain radiates to the back: no   Pain severity:  Mild Onset quality:  Gradual Duration:  1 day Timing:  Intermittent Progression:  Waxing and waning Chronicity:  Recurrent Context: movement   Context: not lifting, not raising an arm, not at rest, no stress and no trauma   Relieved by:  None tried Worsened by:  Exertion Ineffective treatments:  None tried Associated symptoms: no abdominal pain, no AICD problem, no altered mental status, no anorexia, no anxiety, no back pain, no claudication, no cough, no diaphoresis, no dizziness, no dysphagia, no fatigue, no fever, no headache, no heartburn, no lower extremity edema, no nausea, no near-syncope, no numbness, no orthopnea, no palpitations, no PND, no shortness of breath, no syncope, not vomiting and no weakness   Risk factors: coronary artery disease, high cholesterol and hypertension   Risk factors: not female, not obese and no smoking     Past Medical History  Diagnosis Date  . GI bleed 2007  . Hypertension   . TIA (transient ischemic attack)   . Hypothyroidism   . Abdominal aortic aneurysm ([place])   . CAD (coronary artery disease)   . SSS (sick sinus syndrome) ([place]) 10/05/2007    Medtronic Adapta  . Myocardial infarction ([place])   . Atrial fibrillation ([place])   . COPD (chronic obstructive pulmonary disease) ([place])   . Asthma     hx  . Anemia     hx  . Blood transfusion   . UTI (lower urinary tract infection)     hx  . Renal failure, chronic   . H/O  hiatal hernia   . GERD (gastroesophageal reflux disease)   . Stroke ([place])   . Osteoporosis   . Blood transfusion without reported diagnosis   . Pacemaker    Past Surgical History  Procedure Laterality Date  . Pacemaker insertion  10/05/2007    Medtronic Adapta  . Coronary stent placement      x9  . Cardiac catheterization    . Fracture surgery      femur fx, /w ORIF into HIP- 2008  . Total hip arthroplasty      planned for 08/27/2011 Left  . Total hip arthroplasty  08/27/2011    Procedure: TOTAL HIP ARTHROPLASTY;  Surgeon: Kerin Salen, MD;  Location: [facility];  Service: Orthopedics;  Laterality: Right;  . US echocardiography  08/23/2011    EF 50%,LA mod. dilated,mild to mod. mitral annular ca+,mod. MR,mild to mod TR,mild to mod. PH,trace AI.  [name] Nm myocar perf wall motion  10/08/2010    mild apical anterior ischemia  . Colonoscopy     Family History  Problem Relation Age of Onset  . Emphysema Father   . Heart disease Mother   . Cancer Brother   . Cancer Brother   . Cancer Sister   . Cancer Sister   . Diabetes Daughter   . Atrial fibrillation Daughter   . Atrial fibrillation Daughter   . Stroke Daughter   . Heart  Problems Son    Social History  Substance Use Topics  . Smoking status: Former Smoker    Types: Cigarettes  . Smokeless tobacco: Never Used     Comment: quit 40 yrs ago- was smoking 2 packs a day at the most  . Alcohol Use: No   OB History    No data available     Review of Systems  Constitutional: Negative for fever, diaphoresis and fatigue.  HENT: Negative for trouble swallowing.   Respiratory: Negative for cough and shortness of breath.   Cardiovascular: Positive for chest pain. Negative for palpitations, orthopnea, claudication, syncope, PND and near-syncope.  Gastrointestinal: Negative for heartburn, nausea, vomiting, abdominal pain and anorexia.  Musculoskeletal: Negative for back pain.  Neurological: Negative for dizziness, weakness, numbness and  headaches.      Allergies  Nubain; Mirtazapine; Statins; and Iodine  Home Medications   Prior to Admission medications   Medication Sig Start Date End Date Taking? Authorizing Provider  acetaminophen (TYLENOL) 325 MG tablet Take 2 tablets (650 mg total) by mouth every 6 (six) hours as needed for mild pain (or Fever >/= 101). 02/01/15   Bonnielee Haff, MD  amiodarone (PACERONE) 200 MG tablet TAKE 1 TABLET BY MOUTH EVERY DAY 02/10/15   Mihai Croitoru, MD  Biotin 1000 MCG tablet Take 1,000 mcg by mouth daily.    Historical Provider, MD  Calcium Carb-Cholecalciferol (CALCIUM 600 + D PO) Take 1 tablet by mouth daily.    Historical Provider, MD  clopidogrel (PLAVIX) 75 MG tablet TAKE 1 TABLET (75 MG TOTAL) BY MOUTH DAILY. 01/28/15   Mihai Croitoru, MD  feeding supplement (BOOST HIGH PROTEIN) LIQD Take 1 Container by mouth daily.    Historical Provider, MD  ferrous sulfate 325 (65 FE) MG tablet Take 325 mg by mouth daily with breakfast.     Historical Provider, MD  furosemide (LASIX) 20 MG tablet Take 1 tablet (20 mg total) by mouth daily. 11/07/14   Domenic Polite, MD  levothyroxine (SYNTHROID, LEVOTHROID) 125 MCG tablet TAKE 1 TABLET BY MOUTH EVERY DAY 10/30/13   Lorretta Harp, MD  loperamide (IMODIUM) 2 MG capsule Take 1 capsule (2 mg total) by mouth daily as needed for diarrhea or loose stools. 02/01/15   Bonnielee Haff, MD  nitroGLYCERIN (NITROSTAT) 0.4 MG SL tablet Place 1 tablet (0.4 mg total) under the tongue every 5 (five) minutes as needed for chest pain (For a total of 3 tablets, if chest pain persist to call 911). 10/15/14   Lauree Chandler, NP  pantoprazole (PROTONIX) 40 MG tablet TAKE 1 TABLET (40 MG TOTAL) BY MOUTH DAILY. 11/11/14   Lauree Chandler, NP  potassium chloride SA (K-DUR,KLOR-CON) 20 MEQ tablet Take 20 mEq by mouth daily.    Historical Provider, MD  saccharomyces boulardii (FLORASTOR) 250 MG capsule Take 1 capsule (250 mg total) by mouth 2 (two) times daily. 02/01/15    Bonnielee Haff, MD  vitamin B-12 (CYANOCOBALAMIN) 1000 MCG tablet Take 1,000 mcg by mouth daily.    Historical Provider, MD   BP 120/44 mmHg  Pulse 70  Temp(Src) 97.1 F (36.2 C) (Oral)  Resp 19  Wt 50.803 kg  SpO2 100% Physical Exam  Constitutional: She is oriented to person, place, and time. She appears well-developed and well-nourished. No distress.  HENT:  Head: Normocephalic and atraumatic.  Eyes: Conjunctivae and EOM are normal. Pupils are equal, round, and reactive to light. Right eye exhibits no discharge. Left eye exhibits no discharge.  Neck: Normal  range of motion. Neck supple.  Cardiovascular: Normal rate and regular rhythm.  Exam reveals no gallop and no friction rub.   No murmur heard. Pulmonary/Chest: Effort normal and breath sounds normal. No respiratory distress. She has no wheezes. She has no rales. She exhibits no tenderness.  Abdominal: Soft. Bowel sounds are normal. She exhibits no distension and no mass. There is no tenderness. There is no rebound and no guarding.  Musculoskeletal: Normal range of motion.  Neurological: She is alert and oriented to person, place, and time. No cranial nerve deficit.  Skin: Skin is warm and dry. She is not diaphoretic. No erythema.  Psychiatric: She has a normal mood and affect.    ED Course  Procedures (including critical care time) Labs Review Labs Reviewed  BASIC METABOLIC PANEL - Abnormal; Notable for the following:    Chloride 96 (*)    CO2 38 (*)    BUN 25 (*)    Creatinine, Ser 1.60 (*)    Calcium 8.8 (*)    GFR calc non Af Amer 26 (*)    GFR calc Af Amer 30 (*)    All other components within normal limits  CBC - Abnormal; Notable for the following:    RBC 2.89 (*)    Hemoglobin 9.1 (*)    HCT 30.3 (*)    MCV 104.8 (*)    RDW 16.4 (*)    Platelets 131 (*)    All other components within normal limits  BRAIN NATRIURETIC PEPTIDE - Abnormal; Notable for the following:    B Natriuretic Peptide 182.3 (*)    All  other components within normal limits  I-STAT TROPOININ, ED    Imaging Review Dg Chest 2 View  03/10/2015  CLINICAL DATA:  Acute onset of left-sided chest pain. Initial encounter. EXAM: CHEST  2 VIEW COMPARISON:  Chest radiograph performed 01/31/2015 FINDINGS: The lungs are hyperexpanded, with flattening of the hemidiaphragms, compatible with COPD. Small bilateral pleural effusions are seen. Biapical scarring is noted. There is no evidence of pneumothorax. The heart is mildly enlarged. A pacemaker is noted at the left chest wall, with leads ending at the right atrium and right ventricle. No acute osseous abnormalities are seen. IMPRESSION: Findings of COPD, with biapical scarring. Small bilateral pleural effusions seen. Mild cardiomegaly noted. Electronically Signed   By: Garald Balding M.D.   On: 03/10/2015 21:50   I have personally reviewed and evaluated these images and lab results as part of my medical decision-making.   EKG Interpretation   Date/Time:  Monday March 10 2015 21:12:46 EST Ventricular Rate:  73 PR Interval:  270 QRS Duration: 113 QT Interval:  431 QTC Calculation: 475 R Axis:   74 Text Interpretation:  Atrial-paced rhythm Probable left ventricular  hypertrophy since last tracing no significant change Confirmed by MILLER   MD, BRIAN ([id number]) on 03/10/2015 11:29:17 PM      MDM   Final diagnoses:  Left sided chest pain    81 year old Caucasian female past medical history coronary artery disease status post stents 9 persistence in setting of less chest pain. Per family patient was ambulating around him and today when she has some intermittent left axilla chest pain aching in nature. Pain waxed and waned. Patient was given nitroglycerin at nursing facility without complete resolution of pain and was advised by nursing staff there to come to the emergency department further evaluation.  On arrival patient was hemodynamically stable and afebrile. Patient reported pain  had resolved this time. Patient  was joking on examination. Patient had bilateral 2+ pitting edema in lower extremities. She reports this is at her baseline. She denies any current shortness of breath. Patient denied radiation of pain to neck or arm. In setting of patient's history and EKG was obtained. Patient does have pacemaker in place and EKG unchanged from previous tracings. No signs of acute ischemia and there are signs of pacer spikes. Due to pain troponin was obtained which was not elevated. No significant electrolyte abnormalities noted on laboratory analysis. Chest x-ray did not reveal any acute cardiopulmonary abnormality. Patient attained to be pain free on examination. I discussed likely need for admission in setting of patient's symptoms and history with family and patient. At this time patient does not want to be admitted if she is not having acute myocardial infarction at this time. Patient has DO NOT RESUSCITATE orders in place. Patient reports she would like to be discharged to nursing facility and she reports she feels more comfortable being in the facility than being admitted to the hospital. At this time patient is alert and oriented 4 and appropriate to make decisions. Patient reports she will follow up with primary care doctor for further management of pain. Patient was given strict return cautions that would necessitate immediate return to emergency department. Patient and family in agreement with plan.  Attending has seen and evaluated patient and Dr. Sabra Heck is in agreement with plan.    Esaw Grandchild, MD 03/11/15 [id number]  Noemi Chapel, MD 03/11/15 [phone]

## 2015-03-10 NOTE — Discharge Instructions (Signed)
Nonspecific Chest Pain  °Chest pain can be caused by many different conditions. There is always a chance that your pain could be related to something serious, such as a heart attack or a blood clot in your lungs. Chest pain can also be caused by conditions that are not life-threatening. If you have chest pain, it is very important to follow up with your health care provider. °CAUSES  °Chest pain can be caused by: °· Heartburn. °· Pneumonia or bronchitis. °· Anxiety or stress. °· Inflammation around your heart (pericarditis) or lung (pleuritis or pleurisy). °· A blood clot in your lung. °· A collapsed lung (pneumothorax). It can develop suddenly on its own (spontaneous pneumothorax) or from trauma to the chest. °· Shingles infection (varicella-zoster virus). °· Heart attack. °· Damage to the bones, muscles, and cartilage that make up your chest wall. This can include: °¨ Bruised bones due to injury. °¨ Strained muscles or cartilage due to frequent or repeated coughing or overwork. °¨ Fracture to one or more ribs. °¨ Sore cartilage due to inflammation (costochondritis). °RISK FACTORS  °Risk factors for chest pain may include: °· Activities that increase your risk for trauma or injury to your chest. °· Respiratory infections or conditions that cause frequent coughing. °· Medical conditions or overeating that can cause heartburn. °· Heart disease or family history of heart disease. °· Conditions or health behaviors that increase your risk of developing a blood clot. °· Having had chicken pox (varicella zoster). °SIGNS AND SYMPTOMS °Chest pain can feel like: °· Burning or tingling on the surface of your chest or deep in your chest. °· Crushing, pressure, aching, or squeezing pain. °· Dull or sharp pain that is worse when you move, cough, or take a deep breath. °· Pain that is also felt in your back, neck, shoulder, or arm, or pain that spreads to any of these areas. °Your chest pain may come and go, or it may stay  constant. °DIAGNOSIS °Lab tests or other studies may be needed to find the cause of your pain. Your health care provider may have you take a test called an ambulatory ECG (electrocardiogram). An ECG records your heartbeat patterns at the time the test is performed. You may also have other tests, such as: °· Transthoracic echocardiogram (TTE). During echocardiography, sound waves are used to create a picture of all of the heart structures and to look at how blood flows through your heart. °· Transesophageal echocardiogram (TEE). This is a more advanced imaging test that obtains images from inside your body. It allows your health care provider to see your heart in finer detail. °· Cardiac monitoring. This allows your health care provider to monitor your heart rate and rhythm in real time. °· Holter monitor. This is a portable device that records your heartbeat and can help to diagnose abnormal heartbeats. It allows your health care provider to track your heart activity for several days, if needed. °· Stress tests. These can be done through exercise or by taking medicine that makes your heart beat more quickly. °· Blood tests. °· Imaging tests. °TREATMENT  °Your treatment depends on what is causing your chest pain. Treatment may include: °· Medicines. These may include: °¨ Acid blockers for heartburn. °¨ Anti-inflammatory medicine. °¨ Pain medicine for inflammatory conditions. °¨ Antibiotic medicine, if an infection is present. °¨ Medicines to dissolve blood clots. °¨ Medicines to treat coronary artery disease. °· Supportive care for conditions that do not require medicines. This may include: °¨ Resting. °¨ Applying heat   or cold packs to injured areas. °¨ Limiting activities until pain decreases. °HOME CARE INSTRUCTIONS °· If you were prescribed an antibiotic medicine, finish it all even if you start to feel better. °· Avoid any activities that bring on chest pain. °· Do not use any tobacco products, including  cigarettes, chewing tobacco, or electronic cigarettes. If you need help quitting, ask your health care provider. °· Do not drink alcohol. °· Take medicines only as directed by your health care provider. °· Keep all follow-up visits as directed by your health care provider. This is important. This includes any further testing if your chest pain does not go away. °· If heartburn is the cause for your chest pain, you may be told to keep your head raised (elevated) while sleeping. This reduces the chance that acid will go from your stomach into your esophagus. °· Make lifestyle changes as directed by your health care provider. These may include: °¨ Getting regular exercise. Ask your health care provider to suggest some activities that are safe for you. °¨ Eating a heart-healthy diet. A registered dietitian can help you to learn healthy eating options. °¨ Maintaining a healthy weight. °¨ Managing diabetes, if necessary. °¨ Reducing stress. °SEEK MEDICAL CARE IF: °· Your chest pain does not go away after treatment. °· You have a rash with blisters on your chest. °· You have a fever. °SEEK IMMEDIATE MEDICAL CARE IF:  °· Your chest pain is worse. °· You have an increasing cough, or you cough up blood. °· You have severe abdominal pain. °· You have severe weakness. °· You faint. °· You have chills. °· You have sudden, unexplained chest discomfort. °· You have sudden, unexplained discomfort in your arms, back, neck, or jaw. °· You have shortness of breath at any time. °· You suddenly start to sweat, or your skin gets clammy. °· You feel nauseous or you vomit. °· You suddenly feel light-headed or dizzy. °· Your heart begins to beat quickly, or it feels like it is skipping beats. °These symptoms may represent a serious problem that is an emergency. Do not wait to see if the symptoms will go away. Get medical help right away. Call your local emergency services (911 in the [place] Do not drive yourself to the hospital. °  °This  information is not intended to replace advice given to you by your health care provider. Make sure you discuss any questions you have with your health care provider. °  °Document Released: 12/09/2004 Document Revised: 03/22/2014 Document Reviewed: 10/05/2013 °Elsevier Interactive Patient Education ©2016 [employer] ° °

## 2015-03-10 NOTE — ED Provider Notes (Signed)
I saw and evaluated the patient, reviewed the resident's note and I agree with the findings and plan.   Pertinent History: The patient is a 81 year old female, she does have a history of cardiac disease, she also uses chronic oxygen however the last couple of days when her family walks with her they state that she finally develops a small amount of chest pain around her left side which goes away with rest. The patient is not having no symptoms at this time. She is not short of breath and she is not having fevers chills. She does have some lower extremity swelling which she complains of which is also mild and chronic Pertinent Exam findings: On exam the patient has clear heart and lung sounds, no murmurs rubs or gallops, she does have a pacemaker, she does have 2+ pitting edema which is symmetrical, no JVD, no dyspnea, no increased work of breathing  I was personally present and directly supervised the following procedures:  EKG is unremarkable and unchanged, troponin is normal, I do not think that the patient is necessarily having an acute coronary syndrome however the family was offered admission to the hospital for observation. They have chosen to go home, to take her back to [facility] facility in the follow-up in the outpatient setting. I do not think that being overly aggressive with this 81 year old patient is the right answer and I agree that she can be discharged home to return if symptoms worsen.   EKG Interpretation  Date/Time:  Monday March 10 2015 21:12:46 EST Ventricular Rate:  73 PR Interval:  270 QRS Duration: 113 QT Interval:  431 QTC Calculation: 475 R Axis:   74 Text Interpretation:  Atrial-paced rhythm Probable left ventricular hypertrophy since last tracing no significant change Confirmed by Berneice Zettlemoyer  MD, Kaylei Frink ([id number]) on 03/10/2015 11:29:17 PM      I personally interpreted the EKG as well as the resident and agree with the interpretation on the resident's chart.  Final  diagnoses:  Left sided chest pain      Noemi Chapel, MD 03/11/15 1031

## 2015-03-10 NOTE — ED Notes (Signed)
MD at bedside.[name]

## 2015-03-11 DIAGNOSIS — R079 Chest pain, unspecified: Secondary | ICD-10-CM | POA: Diagnosis not present

## 2015-03-11 NOTE — ED Notes (Signed)
Pt discharged back home with ptar. Pt stable and NAD

## 2015-03-13 DIAGNOSIS — R262 Difficulty in walking, not elsewhere classified: Secondary | ICD-10-CM | POA: Diagnosis not present

## 2015-03-13 DIAGNOSIS — L03116 Cellulitis of left lower limb: Secondary | ICD-10-CM | POA: Diagnosis not present

## 2015-03-25 ENCOUNTER — Non-Acute Institutional Stay (SKILLED_NURSING_FACILITY): Payer: Medicare Other | Admitting: Internal Medicine

## 2015-03-25 ENCOUNTER — Encounter: Payer: Self-pay | Admitting: Internal Medicine

## 2015-03-25 DIAGNOSIS — D539 Nutritional anemia, unspecified: Secondary | ICD-10-CM | POA: Diagnosis not present

## 2015-03-25 DIAGNOSIS — I110 Hypertensive heart disease with heart failure: Secondary | ICD-10-CM | POA: Diagnosis not present

## 2015-03-25 DIAGNOSIS — N183 Chronic kidney disease, stage 3 unspecified: Secondary | ICD-10-CM

## 2015-03-25 NOTE — Assessment & Plan Note (Signed)
12/20 H/H 10.1/31.6 improved from prior; cont folate and iron and monitor at intervals

## 2015-03-25 NOTE — Assessment & Plan Note (Signed)
BP well controlled on lasix alone; lasst BMP lytes and renal fx good;plan - cnt lasix 20 mg daily

## 2015-03-25 NOTE — Assessment & Plan Note (Addendum)
12/20 Cr 1.3, improved from 1.5 ; GFR 38, stable; will monitor at intervals

## 2015-03-25 NOTE — Progress Notes (Signed)
MRN: [id number] Name: [name]  Sex: female Age: 82 y.o. DOB: [birth year]  [place] #: [facility] Facility/Room: Level Of Care: SNF Provider: Inocencio Homes D Emergency Contacts: Extended Emergency Contact Information Primary Emergency Contact: [name],[name] Address: [address]          [place], [place] [address] [name] Phone: [phone] Mobile Phone: [phone] Relation: Daughter Secondary Emergency Contact: [name] Address: [address], [place] [address] [name] Phone: [phone] Work Phone: [phone] Mobile Phone: [phone] Relation: Daughter  Code Status:   Allergies: Nubain; Mirtazapine; Statins; and Iodine  Chief Complaint  Patient presents with  . Medical Management of Chronic Issues    HPI: Patient is 82 y.o. female with AAA, hypertension, hypothyroidism, recently hospitalized for LLE cellulitis who is being seen today for routine issues of HTN, CKD3 and anemia.   Past Medical History  Diagnosis Date  . GI bleed 2007  . Hypertension   . TIA (transient ischemic attack)   . Hypothyroidism   . Abdominal aortic aneurysm ([place])   . CAD (coronary artery disease)   . SSS (sick sinus syndrome) ([facility]) 10/05/2007    Medtronic Adapta  . Myocardial infarction ([place])   . Atrial fibrillation ([place])   . COPD (chronic obstructive pulmonary disease) ([place])   . Asthma     hx  . Anemia     hx  . Blood transfusion   . UTI (lower urinary tract infection)     hx  . Renal failure, chronic   . H/O hiatal hernia   . GERD (gastroesophageal reflux disease)   . Stroke ([place])   . Osteoporosis   . Blood transfusion without reported diagnosis   . Pacemaker     Past Surgical History  Procedure Laterality Date  . Pacemaker insertion  10/05/2007    Medtronic Adapta  . Coronary stent placement      x9  . Cardiac catheterization    . Fracture surgery      femur fx, /w ORIF into HIP- 2008  . Total hip  arthroplasty      planned for 08/27/2011 Left  . Total hip arthroplasty  08/27/2011    Procedure: TOTAL HIP ARTHROPLASTY;  Surgeon: Kerin Salen, MD;  Location: [facility];  Service: Orthopedics;  Laterality: Right;  . US echocardiography  08/23/2011    EF 50%,LA mod. dilated,mild to mod. mitral annular ca+,mod. MR,mild to mod TR,mild to mod. PH,trace AI.  [name] Nm myocar perf wall motion  10/08/2010    mild apical anterior ischemia  . Colonoscopy        Medication List       This list is accurate as of: 03/25/15  7:12 PM.  Always use your most recent med list.               acetaminophen 325 MG tablet  Commonly known as:  TYLENOL  Take 2 tablets (650 mg total) by mouth every 6 (six) hours as needed for mild pain (or Fever >/= 101).     amiodarone 200 MG tablet  Commonly known as:  PACERONE  TAKE 1 TABLET BY MOUTH EVERY DAY     Biotin 1000 MCG tablet  Take 1,000 mcg by mouth daily.     CALCIUM 600 + D PO  Take 1 tablet by mouth daily.     clopidogrel 75 MG tablet  Commonly known as:  PLAVIX  TAKE 1  TABLET (75 MG TOTAL) BY MOUTH DAILY.     feeding supplement Liqd  Take 1 Container by mouth daily.     ferrous sulfate 325 (65 FE) MG tablet  Take 325 mg by mouth daily with breakfast.     furosemide 20 MG tablet  Commonly known as:  LASIX  Take 1 tablet (20 mg total) by mouth daily.     levothyroxine 125 MCG tablet  Commonly known as:  SYNTHROID, LEVOTHROID  TAKE 1 TABLET BY MOUTH EVERY DAY     loperamide 2 MG capsule  Commonly known as:  IMODIUM  Take 1 capsule (2 mg total) by mouth daily as needed for diarrhea or loose stools.     nitroGLYCERIN 0.4 MG SL tablet  Commonly known as:  NITROSTAT  Place 1 tablet (0.4 mg total) under the tongue every 5 (five) minutes as needed for chest pain (For a total of 3 tablets, if chest pain persist to call 911).     pantoprazole 40 MG tablet  Commonly known as:  PROTONIX  TAKE 1 TABLET (40 MG TOTAL) BY MOUTH DAILY.     potassium  chloride SA 20 MEQ tablet  Commonly known as:  K-DUR,KLOR-CON  Take 20 mEq by mouth daily.     saccharomyces boulardii 250 MG capsule  Commonly known as:  FLORASTOR  Take 1 capsule (250 mg total) by mouth 2 (two) times daily.     vitamin B-12 1000 MCG tablet  Commonly known as:  CYANOCOBALAMIN  Take 1,000 mcg by mouth daily.        No orders of the defined types were placed in this encounter.    Immunization History  Administered Date(s) Administered  . Influenza,inj,Quad PF,36+ Mos 12/18/2013, 12/09/2014  . Pneumococcal Conjugate-13 12/09/2014    Social History  Substance Use Topics  . Smoking status: Former Smoker    Types: Cigarettes  . Smokeless tobacco: Never Used     Comment: quit 40 yrs ago- was smoking 2 packs a day at the most  . Alcohol Use: No    Review of Systems  DATA OBTAINED: from nurse GENERAL:  no fevers, fatigue, appetite changes SKIN: No itching, rash HEENT: No complaint RESPIRATORY: No cough, wheezing, SOB CARDIAC: No chest pain, palpitations, lower extremity edema  GI: No abdominal pain, No N/V/D or constipation, No heartburn or reflux  GU: No dysuria, frequency or urgency, or incontinence  MUSCULOSKELETAL: No unrelieved bone/joint pain NEUROLOGIC: No headache, dizziness  PSYCHIATRIC: No overt anxiety or sadness  Filed Vitals:   03/25/15 1551  BP: 117/62  Pulse: 59  Temp: 98 F (36.7 C)  Resp: 18    Physical Exam  GENERAL APPEARANCE: Alert, conversant, No acute distress  SKIN: No diaphoresis rash HEENT: very HOH RESPIRATORY: Breathing is even, unlabored. Lung sounds are clear   CARDIOVASCULAR: Heart RRR no murmurs, rubs or gallops. No peripheral edema  GASTROINTESTINAL: Abdomen is soft, non-tender, not distended w/ normal bowel sounds.  GENITOURINARY: Bladder non tender, not distended  MUSCULOSKELETAL: No abnormal joints or musculature NEUROLOGIC: Cranial nerves 2-12 grossly intact. Moves all extremities PSYCHIATRIC: Mood and  affect appropriate to situation, no behavioral issues  Patient Active Problem List   Diagnosis Date Noted  . Pain and swelling of left lower extremity 02/15/2015  . Candidiasis of perineum 02/15/2015  . Hemorrhoids 02/15/2015  . CKD (chronic kidney disease) stage 3, GFR 30-59 ml/min 02/06/2015  . Acute respiratory failure with hypoxia ([facility]) 01/29/2015  . Acute pulmonary edema ([facility]) 01/29/2015  .  SOB (shortness of breath)   . Palliative care encounter   . Sepsis ([facility]) 01/26/2015  . Cellulitis of leg, left 01/26/2015  . Protein-calorie malnutrition, severe ([facility]) 11/06/2014  . Pressure ulcer 11/06/2014  . Acute diastolic CHF (congestive heart failure) ([facility]) 11/04/2014  . Weakness 11/04/2014  . Hypertensive heart disease with CHF (congestive heart failure) ([facility]) 11/04/2014  . Macrocytic anemia 11/04/2014  . Cardiomyopathy, ischemic 11/04/2014  . CHF exacerbation ([facility]) 11/03/2014  . Body mass index (BMI) of 20.0-20.9 in adult 06/23/2014  . Hard of hearing 06/23/2014  . Rectal bleeding 07/16/2013  . Hyperlipidemia, statin intolerance 04/30/2013  . Paroxysmal atrial fibrillation ([facility]) 04/30/2013  . Pacemaker - dual chamber Medtronic 2009 04/30/2013  . AAA (abdominal aortic aneurysm) ([facility]) 04/30/2013  . Bilateral carotid artery stenosis 04/30/2013  . PAD (peripheral artery disease) ([facility]) 04/30/2013  . Constipation 08/31/2011  . Hypotension 08/28/2011  . S/P total hip arthroplasty 08/28/2011  . CAD (coronary artery disease) 08/28/2011  . Hypothyroid 08/28/2011  . Klebsiella cystitis 08/28/2011  . Thrombocytopenia ([facility]) 08/28/2011  . SSS (sick sinus syndrome) ([facility]) 10/05/2007    CBC    Component Value Date/Time   WBC 6.0 03/10/2015 2200   WBC 6.4 08/01/2014 1401   RBC 2.89* 03/10/2015 2200   RBC 3.74* 08/01/2014 1401   HGB 9.1* 03/10/2015 2200   HCT 30.3* 03/10/2015 2200   HCT 38.0 08/01/2014 1401   PLT 131* 03/10/2015 2200   PLT 146* 08/01/2014 1401   MCV 104.8* 03/10/2015  2200   MCV 102* 08/01/2014 1401   LYMPHSABS 0.6* 01/26/2015 1030   LYMPHSABS 1.4 08/01/2014 1401   MONOABS 1.2* 01/26/2015 1030   EOSABS 0.0 01/26/2015 1030   EOSABS 0.1 08/01/2014 1401   BASOSABS 0.0 01/26/2015 1030   BASOSABS 0.0 08/01/2014 1401    CMP     Component Value Date/Time   NA 141 03/10/2015 2200   NA 144 08/01/2014 1401   K 4.2 03/10/2015 2200   CL 96* 03/10/2015 2200   CO2 38* 03/10/2015 2200   GLUCOSE 98 03/10/2015 2200   GLUCOSE 88 08/01/2014 1401   BUN 25* 03/10/2015 2200   BUN 24 08/01/2014 1401   CREATININE 1.60* 03/10/2015 2200   CREATININE 1.16* 01/22/2015 1203   CALCIUM 8.8* 03/10/2015 2200   PROT 6.1* 01/26/2015 1030   PROT 6.0 08/01/2014 1401   ALBUMIN 2.9* 01/26/2015 1030   ALBUMIN 3.9 08/01/2014 1401   AST 218* 01/26/2015 1030   ALT 97* 01/26/2015 1030   ALKPHOS 72 01/26/2015 1030   BILITOT 1.1 01/26/2015 1030   BILITOT 0.3 08/01/2014 1401   GFRNONAA 26* 03/10/2015 2200   GFRAA 30* 03/10/2015 2200    Assessment and Plan  Hypertensive heart disease with CHF (congestive heart failure) (HCC) BP well controlled on lasix alone; lasst BMP lytes and renal fx good;plan - cnt lasix 20 mg daily  CKD (chronic kidney disease) stage 3, GFR 30-59 ml/min 12/20 Cr 1.3, improved from 1.4 ; GFR 38, stable; will monitor at intervals  Macrocytic anemia 12/20 H/H 10.1/31.6 improved from prior; cont folate and iron and monitor at intervals    Hennie Duos, MD

## 2015-04-15 DIAGNOSIS — M79606 Pain in leg, unspecified: Secondary | ICD-10-CM | POA: Diagnosis not present

## 2015-04-16 DIAGNOSIS — E039 Hypothyroidism, unspecified: Secondary | ICD-10-CM | POA: Diagnosis not present

## 2015-04-16 LAB — TSH: TSH: 4.16 u[IU]/mL (ref 0.41–5.90)

## 2015-04-18 DIAGNOSIS — M6281 Muscle weakness (generalized): Secondary | ICD-10-CM | POA: Diagnosis not present

## 2015-04-18 DIAGNOSIS — R1311 Dysphagia, oral phase: Secondary | ICD-10-CM | POA: Diagnosis not present

## 2015-04-18 DIAGNOSIS — I2510 Atherosclerotic heart disease of native coronary artery without angina pectoris: Secondary | ICD-10-CM | POA: Diagnosis not present

## 2015-04-19 DIAGNOSIS — I2510 Atherosclerotic heart disease of native coronary artery without angina pectoris: Secondary | ICD-10-CM | POA: Diagnosis not present

## 2015-04-19 DIAGNOSIS — R1311 Dysphagia, oral phase: Secondary | ICD-10-CM | POA: Diagnosis not present

## 2015-04-19 DIAGNOSIS — M6281 Muscle weakness (generalized): Secondary | ICD-10-CM | POA: Diagnosis not present

## 2015-04-21 DIAGNOSIS — R1311 Dysphagia, oral phase: Secondary | ICD-10-CM | POA: Diagnosis not present

## 2015-04-21 DIAGNOSIS — M6281 Muscle weakness (generalized): Secondary | ICD-10-CM | POA: Diagnosis not present

## 2015-04-21 DIAGNOSIS — I2510 Atherosclerotic heart disease of native coronary artery without angina pectoris: Secondary | ICD-10-CM | POA: Diagnosis not present

## 2015-04-22 DIAGNOSIS — R1311 Dysphagia, oral phase: Secondary | ICD-10-CM | POA: Diagnosis not present

## 2015-04-22 DIAGNOSIS — M6281 Muscle weakness (generalized): Secondary | ICD-10-CM | POA: Diagnosis not present

## 2015-04-22 DIAGNOSIS — I2510 Atherosclerotic heart disease of native coronary artery without angina pectoris: Secondary | ICD-10-CM | POA: Diagnosis not present

## 2015-04-23 ENCOUNTER — Telehealth: Payer: Self-pay | Admitting: Cardiology

## 2015-04-23 ENCOUNTER — Encounter: Payer: Medicare Other | Admitting: *Deleted

## 2015-04-23 DIAGNOSIS — M6281 Muscle weakness (generalized): Secondary | ICD-10-CM | POA: Diagnosis not present

## 2015-04-23 DIAGNOSIS — R1311 Dysphagia, oral phase: Secondary | ICD-10-CM | POA: Diagnosis not present

## 2015-04-23 DIAGNOSIS — I2510 Atherosclerotic heart disease of native coronary artery without angina pectoris: Secondary | ICD-10-CM | POA: Diagnosis not present

## 2015-04-23 NOTE — Telephone Encounter (Signed)
Confirmed remote transmission w/ pt wife.   

## 2015-04-23 NOTE — Telephone Encounter (Signed)
Error below  Confirmed remote transmission w/ pt daughter.

## 2015-04-24 ENCOUNTER — Encounter: Payer: Self-pay | Admitting: Internal Medicine

## 2015-04-24 ENCOUNTER — Non-Acute Institutional Stay (SKILLED_NURSING_FACILITY): Payer: Medicare Other | Admitting: Internal Medicine

## 2015-04-24 DIAGNOSIS — I5032 Chronic diastolic (congestive) heart failure: Secondary | ICD-10-CM | POA: Insufficient documentation

## 2015-04-24 DIAGNOSIS — I480 Paroxysmal atrial fibrillation: Secondary | ICD-10-CM

## 2015-04-24 DIAGNOSIS — J9601 Acute respiratory failure with hypoxia: Secondary | ICD-10-CM

## 2015-04-24 DIAGNOSIS — R1311 Dysphagia, oral phase: Secondary | ICD-10-CM | POA: Diagnosis not present

## 2015-04-24 DIAGNOSIS — E038 Other specified hypothyroidism: Secondary | ICD-10-CM | POA: Diagnosis not present

## 2015-04-24 DIAGNOSIS — I2510 Atherosclerotic heart disease of native coronary artery without angina pectoris: Secondary | ICD-10-CM | POA: Diagnosis not present

## 2015-04-24 DIAGNOSIS — G2581 Restless legs syndrome: Secondary | ICD-10-CM

## 2015-04-24 DIAGNOSIS — M6281 Muscle weakness (generalized): Secondary | ICD-10-CM | POA: Diagnosis not present

## 2015-04-24 NOTE — Progress Notes (Signed)
Patient ID: AUSTYN HAIRE, female   DOB: 1918/01/26, 82 y.o.   MRN: [id number]  MRN: [id number] Name: [name]  Sex: female Age: 82 y.o. DOB: October 15, 1917  [facility]: [facility] Facility/Room:101 Level Of Care: SNF Provider: Wille Celeste Emergency Contacts: Extended Emergency Contact Information Primary Emergency Contact: [name],[name] Address: [address]          [place], [place] [address] [name] of [place] Phone: [phone] Mobile Phone: [phone] Relation: Daughter Secondary Emergency Contact: [name] Address: [place], [place] [address] [name] of [place] Phone: [phone] Work Phone: [phone] Mobile Phone: [phone] Relation: Daughter  Code Status:   Allergies: Nubain; Mirtazapine; Statins; and Iodine  Chief Complaint  Patient presents with  . Medical Management of Chronic Issues   including history of respiratory failure-diastolic CHF-atrial fibrillation-hypothyroidism-anemia history of left lower extremity cellulitis  Acute visit secondary to complaints of restless legs   HPI: Patient is 82 y.o. female with AAA, hypertension, hypothyroidism  and ??oxygen dependent who was recently hospitalized for LLE cellulitis Being seen for routine medical issues as noted above.  She was slated for discharge late last year however her family did not think it was safe for her to go home with her oxygen and significant weakness in care needs-she is now on the long-term care side which I feel is appropriate.  She actually has done quite well-family is wondering whether she needs to be on oxygen and we did do a test today off oxygen her saturation remained in the 90s despite being off oxygen for an extended period of time-at this point will make her oxygen when necessary I did speak with the rehabilitation therapist as well so they will check her oxygen saturations during exertion  She has numerous other issues including  hypotension which appears to have stabilized here recent blood pressures oh 5/56-126/74 she does not report orthostatic symptoms syncope or dizziness  She also has a history of atrial fibrillation continues on amiodarone this appears to be rate controlled she is on Plavix for anticoagulation.  In regards to cellulitis of lower leg this appears to have resolved there was concern about lower extremity edema but DVT was negative for the left legin the padst.  In regards to acute respiratory failure she did have pulmonary edema in the hospital this was thought secondary to IV fluids she received there.  She does continue on low-dose Lasix  He has minimal lower extremity edema appears to be stable in this regards.  Her most acute complaint today is restless legs at night apparently there's some jerking legs which makes it difficult to sleep-Dr. Sheppard Coil has seen her and has tried Tylenol at night as a trial to see if this may give her some relief although apparently according to patient this has not helped much    .  Past Medical History  Diagnosis Date  . GI bleed 2007  . Hypertension   . TIA (transient ischemic attack)   . Hypothyroidism   . Abdominal aortic aneurysm ([facility])   . CAD (coronary artery disease)   . SSS (sick sinus syndrome) ([facility]) 10/05/2007    Medtronic Adapta  . Myocardial infarction ([facility])   . Atrial fibrillation ([facility])   . COPD (chronic obstructive pulmonary disease) ([facility])   . Asthma     hx  . Anemia     hx  . Blood transfusion   . UTI (lower urinary tract  infection)     hx  . Renal failure, chronic   . H/O hiatal hernia   . GERD (gastroesophageal reflux disease)   . Stroke ([facility])   . Osteoporosis   . Blood transfusion without reported diagnosis   . Pacemaker     Past Surgical History  Procedure Laterality Date  . Pacemaker insertion  10/05/2007    Medtronic Adapta  . Coronary stent placement      x9  . Cardiac catheterization    . Fracture surgery       femur fx, /w ORIF into HIP- 2008  . Total hip arthroplasty      planned for 08/27/2011 Left  . Total hip arthroplasty  08/27/2011    Procedure: TOTAL HIP ARTHROPLASTY;  Surgeon: Kerin Salen, MD;  Location: [facility];  Service: Orthopedics;  Laterality: Right;  . US echocardiography  08/23/2011    EF 50%,LA mod. dilated,mild to mod. mitral annular ca+,mod. MR,mild to mod TR,mild to mod. PH,trace AI.  [name] Nm myocar perf wall motion  10/08/2010    mild apical anterior ischemia  . Colonoscopy        Medication List       This list is accurate as of: 04/24/15 11:59 PM.  Always use your most recent med list.               acetaminophen 325 MG tablet  Commonly known as:  TYLENOL  Take 2 tablets (650 mg total) by mouth every 6 (six) hours as needed for mild pain (or Fever >/= 101).     amiodarone 200 MG tablet  Commonly known as:  PACERONE  TAKE 1 TABLET BY MOUTH EVERY DAY     Biotin 1000 MCG tablet  Take 1,000 mcg by mouth daily.     CALCIUM 600 + D PO  Take 1 tablet by mouth daily.     clopidogrel 75 MG tablet  Commonly known as:  PLAVIX  TAKE 1 TABLET (75 MG TOTAL) BY MOUTH DAILY.     feeding supplement Liqd  Take 1 Container by mouth daily.     ferrous sulfate 325 (65 FE) MG tablet  Take 325 mg by mouth daily with breakfast.     furosemide 20 MG tablet  Commonly known as:  LASIX  Take 1 tablet (20 mg total) by mouth daily.     levothyroxine 125 MCG tablet  Commonly known as:  SYNTHROID, LEVOTHROID  TAKE 1 TABLET BY MOUTH EVERY DAY     loperamide 2 MG capsule  Commonly known as:  IMODIUM  Take 1 capsule (2 mg total) by mouth daily as needed for diarrhea or loose stools.     nitroGLYCERIN 0.4 MG SL tablet  Commonly known as:  NITROSTAT  Place 1 tablet (0.4 mg total) under the tongue every 5 (five) minutes as needed for chest pain (For a total of 3 tablets, if chest pain persist to call 911).     pantoprazole 40 MG tablet  Commonly known as:  PROTONIX  TAKE 1 TABLET (40  MG TOTAL) BY MOUTH DAILY.     potassium chloride SA 20 MEQ tablet  Commonly known as:  K-DUR,KLOR-CON  Take 20 mEq by mouth daily.     saccharomyces boulardii 250 MG capsule  Commonly known as:  FLORASTOR  Take 1 capsule (250 mg total) by mouth 2 (two) times daily.     vitamin B-12 1000 MCG tablet  Commonly known as:  CYANOCOBALAMIN  Take 1,000 mcg by  mouth daily.        Immunization History  Administered Date(s) Administered  . Influenza,inj,Quad PF,36+ Mos 12/18/2013, 12/09/2014  . Pneumococcal Conjugate-13 12/09/2014    Social History  Substance Use Topics  . Smoking status: Former Smoker    Types: Cigarettes  . Smokeless tobacco: Never Used     Comment: quit 40 yrs ago- was smoking 2 packs a day at the most  . Alcohol Use: No    Review of Systems  DATA OBTAINED: from patient, nurse,family GENERAL:  no fevers, fatigue, appetite changes--continues to gain strength SKIN: No itching, rash HEENT: No complaint RESPIRATORY: No cough, wheezing,  breathing has improved wonders if she really needs to be on oxygen CARDIAC: No chest pain, palpitations mild lower extremity edema GI: No abdominal pain, No N/V/D or constipation, No heartburn or reflux ;  GU: No frequency or urgency, MUSCULOSKELETAL: No unrelieved bone/joint pain NEUROLOGIC: No headache, dizziness  PSYCHIATRIC: No overt anxiety or sadness  Filed Vitals:   04/24/15 2137  BP: 105/56  Pulse: 77  Temp: 96.9 F (36.1 C)  Resp: 17   O2 saturation in the 90s even when oxygen is removed for over 15 minutes Physical Exam  GENERAL APPEARANCE: Alert, conversant, No acute distress  SKIN: No diaphoresis HEENT: pt is very HOH--visual acuity appears grossly intact RESPIRATORY: Breathing is even, unlabored. Lung sounds are clear with shallow air entry   CARDIOVASCULAR: Heart RRR with some irregular beats no murmurs, rubs or gallops--normal-mild lower extremity edema GASTROINTESTINAL: Abdomen is soft, non-tender, not  distended w/ normal bowel sounds.  GENITOURINARY: Bladder non tender, not distended  MUSCULOSKELETAL: No abnormal joints or musculature is able to stand and use her walker she does work with rehabilitation NEUROLOGIC: Cranial nerves 2-12 grossly intact. Moves all extremities PSYCHIATRIC: Mood and affect appropriate to situation, no behavioral issues we talked about Elvis and she is a big fan of Elvis and could clearly recall seeing him when he was starting out  Patient Active Problem List   Diagnosis Date Noted  . Chronic diastolic congestive heart failure ([facility]) 04/24/2015  . Restless legs 04/24/2015  . Restless leg 04/24/2015  . Pain and swelling of left lower extremity 02/15/2015  . Candidiasis of perineum 02/15/2015  . Hemorrhoids 02/15/2015  . CKD (chronic kidney disease) stage 3, GFR 30-59 ml/min 02/06/2015  . Acute respiratory failure with hypoxia ([place]) 01/29/2015  . Acute pulmonary edema ([place] [facility]) 01/29/2015  . SOB (shortness of breath)   . Palliative care encounter   . Sepsis ([facility] [facility]) 01/26/2015  . Cellulitis of leg, left 01/26/2015  . Protein-calorie malnutrition, severe ([facility]) 11/06/2014  . Pressure ulcer 11/06/2014  . Acute diastolic CHF (congestive heart failure) ([facility]) 11/04/2014  . Weakness 11/04/2014  . Hypertensive heart disease with CHF (congestive heart failure) ([place]) 11/04/2014  . Macrocytic anemia 11/04/2014  . Cardiomyopathy, ischemic 11/04/2014  . CHF exacerbation ([place]) 11/03/2014  . Body mass index (BMI) of 20.0-20.9 in adult 06/23/2014  . Hard of hearing 06/23/2014  . Rectal bleeding 07/16/2013  . Hyperlipidemia, statin intolerance 04/30/2013  . Paroxysmal atrial fibrillation ([place]) 04/30/2013  . Pacemaker - dual chamber Medtronic 2009 04/30/2013  . AAA (abdominal aortic aneurysm) ([facility]) 04/30/2013  . Bilateral carotid artery stenosis 04/30/2013  . PAD (peripheral artery disease) ([place]) 04/30/2013  . Constipation 08/31/2011  . Hypotension 08/28/2011  . S/P total  hip arthroplasty 08/28/2011  . CAD (coronary artery disease) 08/28/2011  . Hypothyroid 08/28/2011  . Klebsiella cystitis 08/28/2011  . Thrombocytopenia ([facility]) 08/28/2011  .  SSS (sick sinus syndrome) ([facility]) 10/05/2007   Labs.  04/16/2015.  TSH-4.16.  03/04/2015.  Sodium 141 potassium 4.1 BUN 24 creatinine 1.3.  WBC 5.6 hemoglobin 10.1 platelets 127 CBC    Component Value Date/Time   WBC 6.0 03/10/2015 2200   WBC 6.4 08/01/2014 1401   RBC 2.89* 03/10/2015 2200   RBC 3.74* 08/01/2014 1401   HGB 9.1* 03/10/2015 2200   HCT 30.3* 03/10/2015 2200   HCT 38.0 08/01/2014 1401   PLT 131* 03/10/2015 2200   PLT 146* 08/01/2014 1401   MCV 104.8* 03/10/2015 2200   MCV 102* 08/01/2014 1401   LYMPHSABS 0.6* 01/26/2015 1030   LYMPHSABS 1.4 08/01/2014 1401   MONOABS 1.2* 01/26/2015 1030   EOSABS 0.0 01/26/2015 1030   EOSABS 0.1 08/01/2014 1401   BASOSABS 0.0 01/26/2015 1030   BASOSABS 0.0 08/01/2014 1401    CMP     Component Value Date/Time   NA 141 03/10/2015 2200   NA 144 08/01/2014 1401   K 4.2 03/10/2015 2200   CL 96* 03/10/2015 2200   CO2 38* 03/10/2015 2200   GLUCOSE 98 03/10/2015 2200   GLUCOSE 88 08/01/2014 1401   BUN 25* 03/10/2015 2200   BUN 24 08/01/2014 1401   CREATININE 1.60* 03/10/2015 2200   CREATININE 1.16* 01/22/2015 1203   CALCIUM 8.8* 03/10/2015 2200   PROT 6.1* 01/26/2015 1030   PROT 6.0 08/01/2014 1401   ALBUMIN 2.9* 01/26/2015 1030   ALBUMIN 3.9 08/01/2014 1401   AST 218* 01/26/2015 1030   ALT 97* 01/26/2015 1030   ALKPHOS 72 01/26/2015 1030   BILITOT 1.1 01/26/2015 1030   BILITOT 0.3 08/01/2014 1401   GFRNONAA 26* 03/10/2015 2200   GFRAA 30* 03/10/2015 2200    Assessment and Plan     History of lower extremity cellulitis this appears essentially resolved and has been stable for some time  Regards to her history of respiratory failure she appears to be stable this regard--will make her oxygen when necessary to keep stats above 90% I did  speak with rehabilitation tech to take her O2 stats during exertion as well she may need oxygen with this we will see how it goes but at rest she appears to be stable without oxygen.  Atrial fibrillation does appear to be rate controlled she is on amiodarone does continue on Plavix as well for anticoagulation.  For anemia we will get update a CBC she is on iron and B12-- hemoglobin was 10.1 on lab done back in December this appears to be relatively stable  4 history of possible diastolic CHF she is on low-dose Lasix-edema appears to be fairly minimal  Again will update a metabolic panel she is on potassium supplementation as well.  Regards hypertension this appears stable recent blood pressures  105/56 126/74  History chronic kidney disease creatinine In December creatinine was 1.3 BUN 24 this appears to be stable will update this.  History hypothyroidism she is on Synthroid recent TSH was within normal limits at 4.16 on 04/16/2015.     History of restless legs-she has completed a trial course of Tylenol apparently she is still complaining of symptoms-Will start low-dose Requip 0.25 mg daily at bedtime we may have to titrate this up but would like to start at a lower dose considering her comorbidities and advanced age-monitor for improvement here-this was discussed with Dr. Sheppard Coil phone  [phone] note greater than 40 minutes spent assessing patient-discussing patient's concerns and family's concerns at bedside-and coordinating and formulating a plan of  care for numerous diagnoses with patient and family input.  Of note greater than 50% of time spent coordinating plan of care    .       Kasaundra Fahrney C,

## 2015-04-25 ENCOUNTER — Encounter: Payer: Self-pay | Admitting: Cardiology

## 2015-04-25 DIAGNOSIS — R1311 Dysphagia, oral phase: Secondary | ICD-10-CM | POA: Diagnosis not present

## 2015-04-25 DIAGNOSIS — M6281 Muscle weakness (generalized): Secondary | ICD-10-CM | POA: Diagnosis not present

## 2015-04-25 DIAGNOSIS — I2510 Atherosclerotic heart disease of native coronary artery without angina pectoris: Secondary | ICD-10-CM | POA: Diagnosis not present

## 2015-04-25 LAB — HEPATIC FUNCTION PANEL
ALT: 17 U/L (ref 7–35)
AST: 31 U/L (ref 13–35)
Alkaline Phosphatase: 75 U/L (ref 25–125)
BILIRUBIN, TOTAL: 0.4 mg/dL

## 2015-04-25 LAB — CBC AND DIFFERENTIAL
HCT: 35 % — AB (ref 36–46)
Hemoglobin: 11.4 g/dL — AB (ref 12.0–16.0)
Platelets: 120 10*3/uL — AB (ref 150–399)
WBC: 6.6 10^3/mL

## 2015-04-25 LAB — BASIC METABOLIC PANEL
BUN: 32 mg/dL — AB (ref 4–21)
Creatinine: 1.7 mg/dL — AB (ref 0.5–1.1)
Glucose: 115 mg/dL
POTASSIUM: 4.5 mmol/L (ref 3.4–5.3)
SODIUM: 142 mmol/L (ref 137–147)

## 2015-04-27 ENCOUNTER — Encounter: Payer: Self-pay | Admitting: Internal Medicine

## 2015-04-28 DIAGNOSIS — M6281 Muscle weakness (generalized): Secondary | ICD-10-CM | POA: Diagnosis not present

## 2015-04-28 DIAGNOSIS — I2510 Atherosclerotic heart disease of native coronary artery without angina pectoris: Secondary | ICD-10-CM | POA: Diagnosis not present

## 2015-04-28 DIAGNOSIS — R1311 Dysphagia, oral phase: Secondary | ICD-10-CM | POA: Diagnosis not present

## 2015-04-30 ENCOUNTER — Telehealth: Payer: Self-pay | Admitting: Cardiovascular Disease

## 2015-04-30 NOTE — Telephone Encounter (Signed)
Spoke w/ pt daughter and informed her that we ordered pt a wirex adapter and if she had that piece she would no longer need the land line phone. Pt daughter verbalized understanding. She is going to look for the wirex adapter and call me back.

## 2015-04-30 NOTE — Telephone Encounter (Signed)
New Message,  Pt dtr calling to speak w/ RN about pt home monitor., Please call back and discuss.

## 2015-04-30 NOTE — Telephone Encounter (Signed)
Spoke w/ pt daughter and she informed me that she needed the wirex adapter ordered. Adapter ordered.

## 2015-05-06 DIAGNOSIS — I10 Essential (primary) hypertension: Secondary | ICD-10-CM | POA: Diagnosis not present

## 2015-05-07 LAB — BASIC METABOLIC PANEL
BUN: 39 mg/dL — AB (ref 4–21)
CREATININE: 1.7 mg/dL — AB (ref 0.5–1.1)
GLUCOSE: 92 mg/dL
Potassium: 4.9 mmol/L (ref 3.4–5.3)
SODIUM: 141 mmol/L (ref 137–147)

## 2015-05-09 DIAGNOSIS — I2510 Atherosclerotic heart disease of native coronary artery without angina pectoris: Secondary | ICD-10-CM | POA: Diagnosis not present

## 2015-05-09 DIAGNOSIS — M6281 Muscle weakness (generalized): Secondary | ICD-10-CM | POA: Diagnosis not present

## 2015-05-09 DIAGNOSIS — R1311 Dysphagia, oral phase: Secondary | ICD-10-CM | POA: Diagnosis not present

## 2015-05-10 ENCOUNTER — Encounter: Payer: Self-pay | Admitting: Internal Medicine

## 2015-05-10 ENCOUNTER — Non-Acute Institutional Stay (SKILLED_NURSING_FACILITY): Payer: Medicare Other | Admitting: Internal Medicine

## 2015-05-10 DIAGNOSIS — B9689 Other specified bacterial agents as the cause of diseases classified elsewhere: Secondary | ICD-10-CM

## 2015-05-10 DIAGNOSIS — N3090 Cystitis, unspecified without hematuria: Secondary | ICD-10-CM | POA: Diagnosis not present

## 2015-05-10 NOTE — Progress Notes (Signed)
MRN: [id number] Name: [name]  Sex: female Age: 82 y.o. DOB: 07-30-1917  [facility]: [facility] Facility/Room: Level Of Care: SNF Provider: Inocencio Homes D Emergency Contacts: Extended Emergency Contact Information Primary Emergency Contact: [name],[name] Address: [address]          [place], [place] [address] [name] of [place] Phone: [phone] Mobile Phone: [phone] Relation: Daughter Secondary Emergency Contact: [name] Address: [place],  [address] [name] of [place] Phone: [phone] Work Phone: [phone] Mobile Phone: [phone] Relation: Daughter  Code Status:   Allergies: Nubain; Mirtazapine; Statins; and Iodine  Chief Complaint  Patient presents with  . Acute Visit    HPI: Patient is 82 y.o. female who c/o of dysuria and urine with odor for 2 days. No flank pain,no fever, nausea or other systemic sx. Nothing makes it better or worse. She says she has had UTI's before and she thinks that is what it is. Pt's urine grew out > 100,000 Klebsiella.  Past Medical History  Diagnosis Date  . GI bleed 2007  . Hypertension   . TIA (transient ischemic attack)   . Hypothyroidism   . Abdominal aortic aneurysm ([place])   . CAD (coronary artery disease)   . SSS (sick sinus syndrome) ([place]) 10/05/2007    Medtronic Adapta  . Myocardial infarction ([place])   . Atrial fibrillation ([place])   . COPD (chronic obstructive pulmonary disease) ([place])   . Asthma     hx  . Anemia     hx  . Blood transfusion   . UTI (lower urinary tract infection)     hx  . Renal failure, chronic   . H/O hiatal hernia   . GERD (gastroesophageal reflux disease)   . Stroke ([place])   . Osteoporosis   . Blood transfusion without reported diagnosis   . Pacemaker     Past Surgical History  Procedure Laterality Date  . Pacemaker insertion  10/05/2007    Medtronic Adapta  . Coronary stent placement      x9  . Cardiac catheterization     . Fracture surgery      femur fx, /w ORIF into HIP- 2008  . Total hip arthroplasty      planned for 08/27/2011 Left  . Total hip arthroplasty  08/27/2011    Procedure: TOTAL HIP ARTHROPLASTY;  Surgeon: Kerin Salen, MD;  Location: [facility];  Service: Orthopedics;  Laterality: Right;  . US echocardiography  08/23/2011    EF 50%,LA mod. dilated,mild to mod. mitral annular ca+,mod. MR,mild to mod TR,mild to mod. PH,trace AI.  [name] Nm myocar perf wall motion  10/08/2010    mild apical anterior ischemia  . Colonoscopy        Medication List       This list is accurate as of: 05/10/15 11:07 AM.  Always use your most recent med list.               acetaminophen 325 MG tablet  Commonly known as:  TYLENOL  Take 2 tablets (650 mg total) by mouth every 6 (six) hours as needed for mild pain (or Fever >/= 101).     amiodarone 200 MG tablet  Commonly known as:  PACERONE  TAKE 1 TABLET BY MOUTH EVERY DAY     Biotin 1000 MCG tablet  Take 1,000 mcg by mouth daily.     CALCIUM 600 + D PO  Take 1  tablet by mouth daily.     clopidogrel 75 MG tablet  Commonly known as:  PLAVIX  TAKE 1 TABLET (75 MG TOTAL) BY MOUTH DAILY.     feeding supplement Liqd  Take 1 Container by mouth daily.     ferrous sulfate 325 (65 FE) MG tablet  Take 325 mg by mouth daily with breakfast.     furosemide 20 MG tablet  Commonly known as:  LASIX  Take 1 tablet (20 mg total) by mouth daily.     levothyroxine 125 MCG tablet  Commonly known as:  SYNTHROID, LEVOTHROID  TAKE 1 TABLET BY MOUTH EVERY DAY     loperamide 2 MG capsule  Commonly known as:  IMODIUM  Take 1 capsule (2 mg total) by mouth daily as needed for diarrhea or loose stools.     nitroGLYCERIN 0.4 MG SL tablet  Commonly known as:  NITROSTAT  Place 1 tablet (0.4 mg total) under the tongue every 5 (five) minutes as needed for chest pain (For a total of 3 tablets, if chest pain persist to call 911).     pantoprazole 40 MG tablet  Commonly known as:   PROTONIX  TAKE 1 TABLET (40 MG TOTAL) BY MOUTH DAILY.     potassium chloride SA 20 MEQ tablet  Commonly known as:  K-DUR,KLOR-CON  Take 20 mEq by mouth daily.     saccharomyces boulardii 250 MG capsule  Commonly known as:  FLORASTOR  Take 1 capsule (250 mg total) by mouth 2 (two) times daily.     vitamin B-12 1000 MCG tablet  Commonly known as:  CYANOCOBALAMIN  Take 1,000 mcg by mouth daily.        No orders of the defined types were placed in this encounter.    Immunization History  Administered Date(s) Administered  . Influenza,inj,Quad PF,36+ Mos 12/18/2013, 12/09/2014  . Pneumococcal Conjugate-13 12/09/2014    Social History  Substance Use Topics  . Smoking status: Former Smoker    Types: Cigarettes  . Smokeless tobacco: Never Used     Comment: quit 40 yrs ago- was smoking 2 packs a day at the most  . Alcohol Use: No    Review of Systems  DATA OBTAINED: from patient, nurse GENERAL:  no fevers, fatigue, appetite changes SKIN: No itching, rash HEENT: No complaint RESPIRATORY: No cough, wheezing, SOB CARDIAC: No chest pain, palpitations, lower extremity edema  GI: No abdominal pain, No N/V/D or constipation, No heartburn or reflux  GU: +dysuria, frequency , urine with odor MUSCULOSKELETAL: No unrelieved bone/joint pain NEUROLOGIC: No headache, dizziness  PSYCHIATRIC: No overt anxiety or sadness  Filed Vitals:   05/10/15 1040  BP: 120/71  Pulse: 74  Temp: 97.3 F (36.3 C)  Resp: 16    Physical Exam  GENERAL APPEARANCE: Alert, conversant, No acute distress  SKIN: No diaphoresis rash, or wounds HEENT: Unremarkable RESPIRATORY: Breathing is even, unlabored. Lung sounds are clear   CARDIOVASCULAR: Heart RRR no murmurs, rubs or gallops. No peripheral edema  GASTROINTESTINAL: Abdomen is soft, non-tender, not distended w/ normal bowel sounds.  GENITOURINARY: Bladder non tender, not distended, no flank pain MUSCULOSKELETAL: No abnormal joints or  musculature NEUROLOGIC: Cranial nerves 2-12 grossly intact. Moves all extremities PSYCHIATRIC: Mood and affect appropriate to situation, no behavioral issues  Patient Active Problem List   Diagnosis Date Noted  . Chronic diastolic congestive heart failure ([place]) 04/24/2015  . Restless legs 04/24/2015  . Restless leg 04/24/2015  . Pain and swelling of left lower  extremity 02/15/2015  . Candidiasis of perineum 02/15/2015  . Hemorrhoids 02/15/2015  . CKD (chronic kidney disease) stage 3, GFR 30-59 ml/min 02/06/2015  . Acute respiratory failure with hypoxia ([place]) 01/29/2015  . Acute pulmonary edema ([place]) 01/29/2015  . SOB (shortness of breath)   . Palliative care encounter   . Sepsis ([place]) 01/26/2015  . Cellulitis of leg, left 01/26/2015  . Protein-calorie malnutrition, severe ([place]) 11/06/2014  . Pressure ulcer 11/06/2014  . Acute diastolic CHF (congestive heart failure) ([place]) 11/04/2014  . Weakness 11/04/2014  . Hypertensive heart disease with CHF (congestive heart failure) ([place]) 11/04/2014  . Macrocytic anemia 11/04/2014  . Cardiomyopathy, ischemic 11/04/2014  . CHF exacerbation ([place]) 11/03/2014  . Body mass index (BMI) of 20.0-20.9 in adult 06/23/2014  . Hard of hearing 06/23/2014  . Rectal bleeding 07/16/2013  . Hyperlipidemia, statin intolerance 04/30/2013  . Paroxysmal atrial fibrillation ([place]) 04/30/2013  . Pacemaker - dual chamber Medtronic 2009 04/30/2013  . AAA (abdominal aortic aneurysm) ([place]) 04/30/2013  . Bilateral carotid artery stenosis 04/30/2013  . PAD (peripheral artery disease) ([place]) 04/30/2013  . Constipation 08/31/2011  . Hypotension 08/28/2011  . S/P total hip arthroplasty 08/28/2011  . CAD (coronary artery disease) 08/28/2011  . Hypothyroid 08/28/2011  . Klebsiella cystitis 08/28/2011  . Thrombocytopenia ([place]) 08/28/2011  . SSS (sick sinus syndrome) ([facility]) 10/05/2007    CBC    Component Value Date/Time   WBC 6.0 03/10/2015 2200   WBC 6.4 08/01/2014  1401   RBC 2.89* 03/10/2015 2200   RBC 3.74* 08/01/2014 1401   HGB 9.1* 03/10/2015 2200   HCT 30.3* 03/10/2015 2200   HCT 38.0 08/01/2014 1401   PLT 131* 03/10/2015 2200   PLT 146* 08/01/2014 1401   MCV 104.8* 03/10/2015 2200   MCV 102* 08/01/2014 1401   LYMPHSABS 0.6* 01/26/2015 1030   LYMPHSABS 1.4 08/01/2014 1401   MONOABS 1.2* 01/26/2015 1030   EOSABS 0.0 01/26/2015 1030   EOSABS 0.1 08/01/2014 1401   BASOSABS 0.0 01/26/2015 1030   BASOSABS 0.0 08/01/2014 1401    CMP     Component Value Date/Time   NA 141 03/10/2015 2200   NA 144 08/01/2014 1401   K 4.2 03/10/2015 2200   CL 96* 03/10/2015 2200   CO2 38* 03/10/2015 2200   GLUCOSE 98 03/10/2015 2200   GLUCOSE 88 08/01/2014 1401   BUN 25* 03/10/2015 2200   BUN 24 08/01/2014 1401   CREATININE 1.60* 03/10/2015 2200   CREATININE 1.16* 01/22/2015 1203   CALCIUM 8.8* 03/10/2015 2200   PROT 6.1* 01/26/2015 1030   PROT 6.0 08/01/2014 1401   ALBUMIN 2.9* 01/26/2015 1030   ALBUMIN 3.9 08/01/2014 1401   AST 218* 01/26/2015 1030   ALT 97* 01/26/2015 1030   ALKPHOS 72 01/26/2015 1030   BILITOT 1.1 01/26/2015 1030   BILITOT 0.3 08/01/2014 1401   GFRNONAA 26* 03/10/2015 2200   GFRAA 30* 03/10/2015 2200    Assessment and Plan  Klebsiella cystitis > 100,000 ; pan sensitive; was going to  treat with augmentin 875 BID for 7 days but pt's CrCL is 27 so will change that to 500 mg BID for 7 days; pt admits she is prone to diarrhea and feels bad every time she takes an antibiotic so I hVE ADDED FLORASTER 250 MG bid for 10 days   Pt seen 05/09/2015; time spent > 25 min;> 50% of time with patient was spent reviewing records, labs, tests and studies, counseling and developing plan of care  Beulah, [name],  MD     

## 2015-05-10 NOTE — Assessment & Plan Note (Addendum)
>   100,000 ; pan sensitive; was going to  treat with augmentin 875 BID for 7 days but pt's CrCL is 27 so will change that to 500 mg BID for 7 days; pt admits she is prone to diarrhea and feels bad every time she takes an antibiotic so I hVE ADDED FLORASTER 250 MG bid for 10 days

## 2015-05-11 DIAGNOSIS — M6281 Muscle weakness (generalized): Secondary | ICD-10-CM | POA: Diagnosis not present

## 2015-05-11 DIAGNOSIS — I2510 Atherosclerotic heart disease of native coronary artery without angina pectoris: Secondary | ICD-10-CM | POA: Diagnosis not present

## 2015-05-11 DIAGNOSIS — R1311 Dysphagia, oral phase: Secondary | ICD-10-CM | POA: Diagnosis not present

## 2015-05-12 DIAGNOSIS — I2510 Atherosclerotic heart disease of native coronary artery without angina pectoris: Secondary | ICD-10-CM | POA: Diagnosis not present

## 2015-05-12 DIAGNOSIS — M6281 Muscle weakness (generalized): Secondary | ICD-10-CM | POA: Diagnosis not present

## 2015-05-12 DIAGNOSIS — R1311 Dysphagia, oral phase: Secondary | ICD-10-CM | POA: Diagnosis not present

## 2015-05-13 DIAGNOSIS — R1311 Dysphagia, oral phase: Secondary | ICD-10-CM | POA: Diagnosis not present

## 2015-05-13 DIAGNOSIS — I2510 Atherosclerotic heart disease of native coronary artery without angina pectoris: Secondary | ICD-10-CM | POA: Diagnosis not present

## 2015-05-13 DIAGNOSIS — M6281 Muscle weakness (generalized): Secondary | ICD-10-CM | POA: Diagnosis not present

## 2015-05-15 DIAGNOSIS — R262 Difficulty in walking, not elsewhere classified: Secondary | ICD-10-CM | POA: Diagnosis not present

## 2015-05-15 DIAGNOSIS — I2510 Atherosclerotic heart disease of native coronary artery without angina pectoris: Secondary | ICD-10-CM | POA: Diagnosis not present

## 2015-05-15 DIAGNOSIS — R1311 Dysphagia, oral phase: Secondary | ICD-10-CM | POA: Diagnosis not present

## 2015-05-16 ENCOUNTER — Other Ambulatory Visit: Payer: Self-pay | Admitting: Internal Medicine

## 2015-05-22 DIAGNOSIS — I10 Essential (primary) hypertension: Secondary | ICD-10-CM | POA: Diagnosis not present

## 2015-05-23 LAB — BASIC METABOLIC PANEL
BUN: 22 mg/dL — AB (ref 4–21)
CREATININE: 1.4 mg/dL — AB (ref 0.5–1.1)
Glucose: 117 mg/dL
POTASSIUM: 4.2 mmol/L (ref 3.4–5.3)
Sodium: 138 mmol/L (ref 137–147)

## 2015-05-29 DIAGNOSIS — R1311 Dysphagia, oral phase: Secondary | ICD-10-CM | POA: Diagnosis not present

## 2015-05-29 DIAGNOSIS — R262 Difficulty in walking, not elsewhere classified: Secondary | ICD-10-CM | POA: Diagnosis not present

## 2015-05-29 DIAGNOSIS — I2510 Atherosclerotic heart disease of native coronary artery without angina pectoris: Secondary | ICD-10-CM | POA: Diagnosis not present

## 2015-06-09 ENCOUNTER — Non-Acute Institutional Stay (SKILLED_NURSING_FACILITY): Payer: Medicare Other | Admitting: Internal Medicine

## 2015-06-09 ENCOUNTER — Encounter: Payer: Self-pay | Admitting: Internal Medicine

## 2015-06-09 DIAGNOSIS — I482 Chronic atrial fibrillation, unspecified: Secondary | ICD-10-CM

## 2015-06-09 DIAGNOSIS — R509 Fever, unspecified: Secondary | ICD-10-CM | POA: Diagnosis not present

## 2015-06-09 DIAGNOSIS — R059 Cough, unspecified: Secondary | ICD-10-CM | POA: Insufficient documentation

## 2015-06-09 DIAGNOSIS — R05 Cough: Secondary | ICD-10-CM

## 2015-06-09 DIAGNOSIS — D509 Iron deficiency anemia, unspecified: Secondary | ICD-10-CM | POA: Diagnosis not present

## 2015-06-09 NOTE — Progress Notes (Signed)
Patient ID: [name], female   DOB: 1918-02-20, 82 y.o.   MRN: [id number]   MRN: [id number] Name: [name]  Sex: female Age: 82 y.o. DOB: 12-01-1917  [facility] Facility/Room:101 Level Of Care: SNF Provider: Wille Celeste Emergency Contacts: Extended Emergency Contact Information Primary Emergency Contact: [name] Address: [address]          [place], [place] [address] [name] of [place] Phone: [phone] Mobile Phone: [phone] Relation: Daughter Secondary Emergency Contact: [name] Address: [place], [place] [address] [name] of [place] Phone: [phone] Work Phone: [phone] Mobile Phone: [phone] Relation: Daughter  Code Status:   Allergies: Nubain; Mirtazapine; Statins; and Iodine  Chief Complaint  Patient presents with  . Acute Visit  Acute visit secondary cough congestion low-grade fever   HPI: Patient is 82 y.o. female with AAA, hypertension, hypothyroidism and history of respiratory failure-at one point had been on oxygen but has been weaned off.  Apparently she developed increased cough and congestion noted by nursing today she had been out actually at a birthday party yesterday-however speaking with the family they think that this started at some point late last week Is also noted to have a temperature of 100.0 this morning.  Otherwise vital signs are stable. O2 saturations are in the 90s on room air  She does have some history of lower extremity edema with a history of pulmonary edema although the hospital this was thought secondary to IV fluids in the hospital.--She is on Lasix 20 mg a day with potassium supplementation  She does not complain of any acute shortness of breath but she does appear weaker --I am  to seeing her ambulate with walker in the hallway and has done quite well-but today she is lying in bed says she just doesn't feel that good  S   She has numerous  other issues including hypotension which appears to have stabilized here recent blood pressures 113/68 she does not report orthostatic symptoms syncope or dizziness  She also has a history of atrial fibrillation continues on amiodarone this appears to be rate controlled she is on Plavix for anticoagulation.      .  Past Medical History  Diagnosis Date  . GI bleed 2007  . Hypertension   . TIA (transient ischemic attack)   . Hypothyroidism   . Abdominal aortic aneurysm ([place])   . CAD (coronary artery disease)   . SSS (sick sinus syndrome) ([place]) 10/05/2007    Medtronic Adapta  . Myocardial infarction ([place])   . Atrial fibrillation ([place])   . COPD (chronic obstructive pulmonary disease) ([place])   . Asthma     hx  . Anemia     hx  . Blood transfusion   . UTI (lower urinary tract infection)     hx  . Renal failure, chronic   . H/O hiatal hernia   . GERD (gastroesophageal reflux disease)   . Stroke ([place])   . Osteoporosis   . Blood transfusion without reported diagnosis   . Pacemaker     Past Surgical History  Procedure Laterality Date  . Pacemaker insertion  10/05/2007    Medtronic Adapta  . Coronary stent placement      x9  . Cardiac catheterization    . Fracture surgery      femur fx, /w ORIF into HIP- 2008  . Total hip arthroplasty      planned  for 08/27/2011 Left  . Total hip arthroplasty  08/27/2011    Procedure: TOTAL HIP ARTHROPLASTY;  Surgeon: Kerin Salen, MD;  Location: [facility];  Service: Orthopedics;  Laterality: Right;  . US echocardiography  08/23/2011    EF 50%,LA mod. dilated,mild to mod. mitral annular ca+,mod. MR,mild to mod TR,mild to mod. PH,trace AI.  [name] Nm myocar perf wall motion  10/08/2010    mild apical anterior ischemia  . Colonoscopy        Medication List       This list is accurate as of: 06/09/15 11:59 PM.  Always use your most recent med list.               acetaminophen 325 MG tablet  Commonly known as:  TYLENOL  Take 2 tablets (650 mg  total) by mouth every 6 (six) hours as needed for mild pain (or Fever >/= 101).     amiodarone 200 MG tablet  Commonly known as:  PACERONE  TAKE 1 TABLET BY MOUTH EVERY DAY     Biotin 1000 MCG tablet  Take 1,000 mcg by mouth daily.     CALCIUM 600 + D PO  Take 1 tablet by mouth daily.     clopidogrel 75 MG tablet  Commonly known as:  PLAVIX  TAKE 1 TABLET (75 MG TOTAL) BY MOUTH DAILY.     feeding supplement Liqd  Take 1 Container by mouth daily.     ferrous sulfate 325 (65 FE) MG tablet  Take 325 mg by mouth daily with breakfast.     furosemide 20 MG tablet  Commonly known as:  LASIX  Take 1 tablet (20 mg total) by mouth daily.     levothyroxine 125 MCG tablet  Commonly known as:  SYNTHROID, LEVOTHROID  TAKE 1 TABLET BY MOUTH EVERY DAY     loperamide 2 MG capsule  Commonly known as:  IMODIUM  Take 1 capsule (2 mg total) by mouth daily as needed for diarrhea or loose stools.     nitroGLYCERIN 0.4 MG SL tablet  Commonly known as:  NITROSTAT  Place 1 tablet (0.4 mg total) under the tongue every 5 (five) minutes as needed for chest pain (For a total of 3 tablets, if chest pain persist to call 911).     pantoprazole 40 MG tablet  Commonly known as:  PROTONIX  TAKE 1 TABLET (40 MG TOTAL) BY MOUTH DAILY.     potassium chloride SA 20 MEQ tablet  Commonly known as:  K-DUR,KLOR-CON  Take 20 mEq by mouth daily.     saccharomyces boulardii 250 MG capsule  Commonly known as:  FLORASTOR  Take 1 capsule (250 mg total) by mouth 2 (two) times daily.     vitamin B-12 1000 MCG tablet  Commonly known as:  CYANOCOBALAMIN  Take 1,000 mcg by mouth daily.        Immunization History  Administered Date(s) Administered  . Influenza,inj,Quad PF,36+ Mos 12/18/2013, 12/09/2014  . Pneumococcal Conjugate-13 12/09/2014    Social History  Substance Use Topics  . Smoking status: Former Smoker    Types: Cigarettes  . Smokeless tobacco: Never Used     Comment: quit 40 yrs ago- was  smoking 2 packs a day at the most  . Alcohol Use: No    Review of Systems  DATA OBTAINED: from patient, nurse,family GENERAL:  Has low-grade fever-complains of increased weakness SKIN: No itching, rash HEENT: His not really complain of any eye drainage or nasal  drainage RESPIRATORY: Does have a cough and some chest congestion does not really complain of any acute shortness of breath CARDIAC: No chest pain, palpitations mild lower extremity edema GI: No abdominal pain, No N/V/D or constipation, No heartburn or reflux ;  GU: No frequency or urgency, MUSCULOSKELETAL: No unrelieved bone/joint pain NEUROLOGIC: No headache, dizziness  PSYCHIATRIC: No overt anxiety or sadness  Filed Vitals:   06/09/15 1246  BP: 113/68  Pulse: 78  Temp: 100 F (37.8 C)  Resp: 20   O2 saturation in the 90s  Physical Exam  GENERAL APPEARANCE: Alert, conversant, No acute distress But lying in bed appear somewhat weak and worn out SKIN: No diaphoresis HEENT: pt is very HOH--visual acuity appears grossly intact RESPIRATORY: No labored breathing there is some slight diffuse congestion  CARDIOVASCULAR: Heart RRR with some irregular beats no murmurs, rubs or gallops--normal-mild lower extremity edema GASTROINTESTINAL: Abdomen is soft, non-tender, not distended w/ normal bowel sounds.  GENITOURINARY: Bladder non tender, not distended  MUSCULOSKELETAL: No abnormal joints or musculature exam is somewhat limited with patient lying in bed is able to move all extremities NEUROLOGIC: Cranial nerves 2-12 grossly intact. Moves all extremities PSYCHIATRIC: Mood and affect appropriate to situation, no behavioral issues w Continues to be pleasant and conversant  Patient Active Problem List   Diagnosis Date Noted  . Cough 06/09/2015  . Fever, unspecified 06/09/2015  . Chronic diastolic congestive heart failure ([place]) 04/24/2015  . Restless legs 04/24/2015  . Restless leg 04/24/2015  . Pain and swelling of left  lower extremity 02/15/2015  . Candidiasis of perineum 02/15/2015  . Hemorrhoids 02/15/2015  . CKD (chronic kidney disease) stage 3, GFR 30-59 ml/min 02/06/2015  . Acute respiratory failure with hypoxia ([place]) 01/29/2015  . Acute pulmonary edema ([place]) 01/29/2015  . SOB (shortness of breath)   . Palliative care encounter   . Sepsis ([place]) 01/26/2015  . Cellulitis of leg, left 01/26/2015  . Protein-calorie malnutrition, severe ([place]) 11/06/2014  . Pressure ulcer 11/06/2014  . Acute diastolic CHF (congestive heart failure) ([place]) 11/04/2014  . Weakness 11/04/2014  . Hypertensive heart disease with CHF (congestive heart failure) ([place]) 11/04/2014  . Macrocytic anemia 11/04/2014  . Cardiomyopathy, ischemic 11/04/2014  . CHF exacerbation ([place]) 11/03/2014  . Body mass index (BMI) of 20.0-20.9 in adult 06/23/2014  . Hard of hearing 06/23/2014  . Rectal bleeding 07/16/2013  . Hyperlipidemia, statin intolerance 04/30/2013  . Paroxysmal atrial fibrillation ([place]) 04/30/2013  . Pacemaker - dual chamber Medtronic 2009 04/30/2013  . AAA (abdominal aortic aneurysm) ([place]) 04/30/2013  . Bilateral carotid artery stenosis 04/30/2013  . PAD (peripheral artery disease) ([place]) 04/30/2013  . Constipation 08/31/2011  . Hypotension 08/28/2011  . S/P total hip arthroplasty 08/28/2011  . CAD (coronary artery disease) 08/28/2011  . Hypothyroid 08/28/2011  . Klebsiella cystitis 08/28/2011  . Thrombocytopenia ([place]) 08/28/2011  . SSS (sick sinus syndrome) ([place]) 10/05/2007   Labs.  05/23/2015.  Sodium 138 potassium 4.2 BUN 22 creatinine 1.35.  04/25/2015.  WBC 6.6 hemoglobin 11.4 platelets 120  04/16/2015.  TSH-4.16.  03/04/2015.  Sodium 141 potassium 4.1 BUN 24 creatinine 1.3.  WBC 5.6 hemoglobin 10.1 platelets 127 CBC    Component Value Date/Time   WBC 6.0 03/10/2015 2200   WBC 6.4 08/01/2014 1401   RBC 2.89* 03/10/2015 2200   RBC 3.74* 08/01/2014 1401   HGB 9.1* 03/10/2015 2200   HCT 30.3*  03/10/2015 2200   HCT 38.0 08/01/2014 1401   PLT 131* 03/10/2015 2200   PLT 146*  08/01/2014 1401   MCV 104.8* 03/10/2015 2200   MCV 102* 08/01/2014 1401   LYMPHSABS 0.6* 01/26/2015 1030   LYMPHSABS 1.4 08/01/2014 1401   MONOABS 1.2* 01/26/2015 1030   EOSABS 0.0 01/26/2015 1030   EOSABS 0.1 08/01/2014 1401   BASOSABS 0.0 01/26/2015 1030   BASOSABS 0.0 08/01/2014 1401    CMP     Component Value Date/Time   NA 141 03/10/2015 2200   NA 144 08/01/2014 1401   K 4.2 03/10/2015 2200   CL 96* 03/10/2015 2200   CO2 38* 03/10/2015 2200   GLUCOSE 98 03/10/2015 2200   GLUCOSE 88 08/01/2014 1401   BUN 25* 03/10/2015 2200   BUN 24 08/01/2014 1401   CREATININE 1.60* 03/10/2015 2200   CREATININE 1.16* 01/22/2015 1203   CALCIUM 8.8* 03/10/2015 2200   PROT 6.1* 01/26/2015 1030   PROT 6.0 08/01/2014 1401   ALBUMIN 2.9* 01/26/2015 1030   ALBUMIN 3.9 08/01/2014 1401   AST 218* 01/26/2015 1030   ALT 97* 01/26/2015 1030   ALKPHOS 72 01/26/2015 1030   BILITOT 1.1 01/26/2015 1030   BILITOT 0.3 08/01/2014 1401   GFRNONAA 26* 03/10/2015 2200   GFRAA 30* 03/10/2015 2200    Assessment and Plan   Cough and congestion-Mucinex has been ordered twice a day for 7 days-also duo nebs every 6 hours routine for 48 hours and then when necessary-chest x-ray is pending.  Patient does appear to be pretty frail weak-she is vulnerable with a history of respiratory failure Will treat this aggressively with Augmentin 500 mg twice a day for 7 days-also will have Probiotictwice a day.  Also will obtain a flu swab.  Will update a CBC and metabolic panel tomorrow    Atrial fibrillation does appear to be rate controlled she is on amiodarone does continue on Plavix as well for anticoagulation.  For anemia we will get update a CBC she is on iron and B12-- hemoglobin was 11.4 on lab done in February this appears relatively stable   4 history of possible diastolic CHF she is on low-dose Lasix-edema appears to  be fairly minimal     History chronic kidney disease creatinine In December creatinine was 1.3 BUN 24 was updated earlier this month on March 10 and show stability with a creatinine of 1.35 BUN of 22  History hypothyroidism she is on Synthroid recent TSH was within normal limits at 4.16 on 04/16/2015.     History of restless legs- She is on low-dose Requip 0.25 mg daily at bedtime apparently this has helped-.    [id number]- Of note greater than 35 minutes spent assessing patient-actually did reassess her before leaving the facility she appeared to be stable-reviewing her chart--labs---and coordinating a plan of care for numerous diagnoses-of note greater than 50% of time spent  coordinating a plan a care and I did discuss this plan of care with family at bedside    .       Lopaka Karge C,

## 2015-06-10 DIAGNOSIS — I129 Hypertensive chronic kidney disease with stage 1 through stage 4 chronic kidney disease, or unspecified chronic kidney disease: Secondary | ICD-10-CM | POA: Diagnosis not present

## 2015-06-11 LAB — CBC AND DIFFERENTIAL
HEMATOCRIT: 36 % (ref 36–46)
HEMOGLOBIN: 11.3 g/dL — AB (ref 12.0–16.0)
Platelets: 101 10*3/uL — AB (ref 150–399)
WBC: 6 10^3/mL

## 2015-06-11 LAB — BASIC METABOLIC PANEL
BUN: 26 mg/dL — AB (ref 4–21)
Creatinine: 1.5 mg/dL — AB (ref 0.5–1.1)
GLUCOSE: 82 mg/dL
POTASSIUM: 4.6 mmol/L (ref 3.4–5.3)
SODIUM: 138 mmol/L (ref 137–147)

## 2015-06-11 LAB — HEPATIC FUNCTION PANEL
ALK PHOS: 61 U/L (ref 25–125)
ALT: 57 U/L — AB (ref 7–35)
AST: 97 U/L — AB (ref 13–35)
Bilirubin, Total: 0.3 mg/dL

## 2015-06-18 ENCOUNTER — Non-Acute Institutional Stay (SKILLED_NURSING_FACILITY): Payer: Medicare Other | Admitting: Internal Medicine

## 2015-06-18 ENCOUNTER — Encounter: Payer: Self-pay | Admitting: Internal Medicine

## 2015-06-18 DIAGNOSIS — R05 Cough: Secondary | ICD-10-CM | POA: Diagnosis not present

## 2015-06-18 DIAGNOSIS — J441 Chronic obstructive pulmonary disease with (acute) exacerbation: Secondary | ICD-10-CM | POA: Diagnosis not present

## 2015-06-18 DIAGNOSIS — R0989 Other specified symptoms and signs involving the circulatory and respiratory systems: Secondary | ICD-10-CM | POA: Diagnosis not present

## 2015-06-18 NOTE — Progress Notes (Addendum)
MRN: [id number] Name: [name]  Sex: female Age: 82 y.o. DOB: 01-11-1918  [place] #: [facility]  Facility/Room:207 Level Of Care: SNF Provider: Inocencio Homes D Emergency Contacts: Extended Emergency Contact Information Primary Emergency Contact: [name],[name] Address: [address]          [place], [place] [address] [name] of [place] Phone: [phone] Mobile Phone: [phone] Relation: Daughter Secondary Emergency Contact: [name] Address: [place], [place] [address] [name] of [place] Phone: [phone] Work Phone: [phone] Mobile Phone: [phone] Relation: Daughter  Code Status:   Allergies: Nubain; Mirtazapine; Pineapple; Statins; and Iodine  Chief Complaint  Patient presents with  . Acute Visit    HPI: Patient is 82 y.o. female who nursing asked me to see for a cough noted today with dec in O2 sat at 88%. Pt's usual O2 sat is 94% on RA. Nurse reported that pt sounded tight and was given a neb prior to my seeing her and she was improved. No fever or MS change. No sputum production. Pt has been on Augmentin for past 7 days for  URI, has been off for only 3 days.Prior hx of smoking.  Past Medical History  Diagnosis Date  . GI bleed 2007  . Hypertension   . TIA (transient ischemic attack)   . Hypothyroidism   . Abdominal aortic aneurysm ([place])   . CAD (coronary artery disease)   . SSS (sick sinus syndrome) ([place]) 10/05/2007    Medtronic Adapta  . Myocardial infarction ([place])   . Atrial fibrillation ([place])   . COPD (chronic obstructive pulmonary disease) ([place])   . Asthma     hx  . Anemia     hx  . Blood transfusion   . UTI (lower urinary tract infection)     hx  . Renal failure, chronic   . H/O hiatal hernia   . GERD (gastroesophageal reflux disease)   . Stroke ([place])   . Osteoporosis   . Blood transfusion without reported diagnosis   . Pacemaker     Past Surgical History  Procedure Laterality  Date  . Pacemaker insertion  10/05/2007    Medtronic Adapta  . Coronary stent placement      x9  . Cardiac catheterization    . Fracture surgery      femur fx, /w ORIF into HIP- 2008  . Total hip arthroplasty      planned for 08/27/2011 Left  . Total hip arthroplasty  08/27/2011    Procedure: TOTAL HIP ARTHROPLASTY;  Surgeon: Kerin Salen, MD;  Location: [facility];  Service: Orthopedics;  Laterality: Right;  . US echocardiography  08/23/2011    EF 50%,LA mod. dilated,mild to mod. mitral annular ca+,mod. MR,mild to mod TR,mild to mod. PH,trace AI.  [name] Nm myocar perf wall motion  10/08/2010    mild apical anterior ischemia  . Colonoscopy        Medication List       This list is accurate as of: 06/18/15 11:59 PM.  Always use your most recent med list.               acetaminophen 325 MG tablet  Commonly known as:  TYLENOL  Take 2 tablets (650 mg total) by mouth every 6 (six) hours as needed for mild pain (or Fever >/= 101).     amiodarone 200 MG tablet  Commonly known as:  PACERONE  TAKE 1 TABLET BY  MOUTH EVERY DAY     Biotin 1000 MCG tablet  Take 1,000 mcg by mouth daily.     CALCIUM 600 + D PO  Take 1 tablet by mouth daily.     clopidogrel 75 MG tablet  Commonly known as:  PLAVIX  TAKE 1 TABLET (75 MG TOTAL) BY MOUTH DAILY.     feeding supplement Liqd  Take 1 Container by mouth daily.     ferrous sulfate 325 (65 FE) MG tablet  Take 325 mg by mouth daily.     furosemide 20 MG tablet  Commonly known as:  LASIX  Take 1 tablet (20 mg total) by mouth daily.     levothyroxine 125 MCG tablet  Commonly known as:  SYNTHROID, LEVOTHROID  TAKE 1 TABLET BY MOUTH EVERY DAY     loperamide 2 MG capsule  Commonly known as:  IMODIUM  Take 1 capsule (2 mg total) by mouth daily as needed for diarrhea or loose stools.     nitroGLYCERIN 0.4 MG SL tablet  Commonly known as:  NITROSTAT  Place 1 tablet (0.4 mg total) under the tongue every 5 (five) minutes as needed for chest pain (For  a total of 3 tablets, if chest pain persist to call 911).     pantoprazole 40 MG tablet  Commonly known as:  PROTONIX  TAKE 1 TABLET (40 MG TOTAL) BY MOUTH DAILY.     potassium chloride SA 20 MEQ tablet  Commonly known as:  K-DUR,KLOR-CON  Take 20 mEq by mouth daily.     saccharomyces boulardii 250 MG capsule  Commonly known as:  FLORASTOR  Take 1 capsule (250 mg total) by mouth 2 (two) times daily.     vitamin B-12 1000 MCG tablet  Commonly known as:  CYANOCOBALAMIN  Take 1,000 mcg by mouth daily.        No orders of the defined types were placed in this encounter.    Immunization History  Administered Date(s) Administered  . Influenza,inj,Quad PF,36+ Mos 12/18/2013, 12/09/2014  . Pneumococcal Conjugate-13 12/09/2014    Social History  Substance Use Topics  . Smoking status: Former Smoker    Types: Cigarettes  . Smokeless tobacco: Never Used     Comment: quit 40 yrs ago- was smoking 2 packs a day at the most  . Alcohol Use: No    Review of Systems  DATA OBTAINED: from patient, nurse - as per HPI GENERAL:  no fevers, fatigue, appetite changes SKIN: No itching, rash HEENT: No complaint RESPIRATORY: + cough,no wheezing, +SOB CARDIAC: No chest pain, palpitations, lower extremity edema  GI: No abdominal pain, No N/V/D or constipation, No heartburn or reflux  GU: No dysuria, frequency or urgency, or incontinence  MUSCULOSKELETAL: No unrelieved bone/joint pain NEUROLOGIC: No headache, dizziness  PSYCHIATRIC: No overt anxiety or sadness  Filed Vitals:   06/18/15 1407  BP: 110/70  Pulse: 100  Temp: 98.1 F (36.7 C)  Resp: 18    Physical Exam  GENERAL APPEARANCE: Alert, conversant, No acute distress  SKIN: No diaphoresis rash,  HEENT: Unremarkable RESPIRATORY: Breathing is even, unlabored. Lung sounds are mild wheeze, no rales   CARDIOVASCULAR: Heart RRR no murmurs, rubs or gallops. No peripheral edema  GASTROINTESTINAL: Abdomen is soft, non-tender, not  distended w/ normal bowel sounds.  GENITOURINARY: Bladder non tender, not distended  MUSCULOSKELETAL: No abnormal joints or musculature NEUROLOGIC: Cranial nerves 2-12 grossly intact. Moves all extremities PSYCHIATRIC: Mood and affect appropriate to situation, no behavioral issues  Patient Active  Problem List   Diagnosis Date Noted  . DNR (do not resuscitate)   . Weakness generalized   . GERD (gastroesophageal reflux disease) 07/04/2015  . Dysphagia 07/04/2015  . Transaminitis 06/24/2015  . HCAP (healthcare-associated pneumonia) 06/22/2015  . Pneumonia 06/22/2015  . COPD exacerbation ([place]) 06/21/2015  . Cough 06/09/2015  . Fever, unspecified 06/09/2015  . Chronic diastolic congestive heart failure ([place]) 04/24/2015  . Restless legs 04/24/2015  . Restless leg 04/24/2015  . Pain and swelling of left lower extremity 02/15/2015  . Candidiasis of perineum 02/15/2015  . Hemorrhoids 02/15/2015  . CKD (chronic kidney disease) stage 3, GFR 30-59 ml/min 02/06/2015  . Acute respiratory failure with hypoxia ([place]) 01/29/2015  . Acute pulmonary edema ([place]) 01/29/2015  . SOB (shortness of breath)   . Palliative care encounter   . Sepsis ([place]) 01/26/2015  . Cellulitis of leg, left 01/26/2015  . Protein-calorie malnutrition, severe ([place]) 11/06/2014  . Pressure ulcer 11/06/2014  . Acute diastolic CHF (congestive heart failure) ([place]) 11/04/2014  . Weakness 11/04/2014  . Hypertensive heart disease with CHF (congestive heart failure) ([place]) 11/04/2014  . Macrocytic anemia 11/04/2014  . Cardiomyopathy, ischemic 11/04/2014  . CHF exacerbation ([place]) 11/03/2014  . Body mass index (BMI) of 20.0-20.9 in adult 06/23/2014  . Hard of hearing 06/23/2014  . Rectal bleeding 07/16/2013  . Hyperlipidemia, statin intolerance 04/30/2013  . Paroxysmal atrial fibrillation ([place]) 04/30/2013  . Pacemaker - dual chamber Medtronic 2009 04/30/2013  . AAA (abdominal aortic aneurysm) ([place]) 04/30/2013  . Bilateral  carotid artery stenosis 04/30/2013  . PAD (peripheral artery disease) ([place]) 04/30/2013  . Constipation 08/31/2011  . Hypotension 08/28/2011  . S/P total hip arthroplasty 08/28/2011  . CAD (coronary artery disease) 08/28/2011  . Hypothyroid 08/28/2011  . Klebsiella cystitis 08/28/2011  . Thrombocytopenia ([place]) 08/28/2011  . SSS (sick sinus syndrome) ([facility]) 10/05/2007    CBC    Component Value Date/Time   WBC 5.8 06/24/2015 0648   WBC 6.4 08/01/2014 1401   RBC 3.73* 06/24/2015 0648   RBC 3.74* 08/01/2014 1401   HGB 11.1* 06/24/2015 0648   HCT 36.5 06/24/2015 0648   HCT 38.0 08/01/2014 1401   PLT 185 06/24/2015 0648   PLT 146* 08/01/2014 1401   MCV 97.9 06/24/2015 0648   MCV 102* 08/01/2014 1401   LYMPHSABS 1.2 06/23/2015 0352   LYMPHSABS 1.4 08/01/2014 1401   MONOABS 0.6 06/23/2015 0352   EOSABS 0.1 06/23/2015 0352   EOSABS 0.1 08/01/2014 1401   BASOSABS 0.0 06/23/2015 0352   BASOSABS 0.0 08/01/2014 1401    CMP     Component Value Date/Time   NA 141 06/26/2015 0540   NA 144 08/01/2014 1401   K 4.4 06/26/2015 0540   CL 106 06/26/2015 0540   CO2 27 06/26/2015 0540   GLUCOSE 107* 06/26/2015 0540   GLUCOSE 88 08/01/2014 1401   BUN 12 06/26/2015 0540   BUN 24 08/01/2014 1401   CREATININE 1.17* 06/26/2015 0540   CREATININE 1.16* 01/22/2015 1203   CALCIUM 8.5* 06/26/2015 0540   PROT 4.9* 06/24/2015 0648   PROT 6.0 08/01/2014 1401   ALBUMIN 2.1* 06/26/2015 0540   ALBUMIN 3.9 08/01/2014 1401   AST 88* 06/24/2015 0648   ALT 57* 06/24/2015 0648   ALKPHOS 73 06/24/2015 0648   BILITOT 0.7 06/24/2015 0648   BILITOT 0.3 08/01/2014 1401   GFRNONAA 38* 06/26/2015 0540   GFRAA 44* 06/26/2015 0540   CXR - my read - COPD, no infiltrate;  Formal read - NAD  Assessment and Plan  COPD exacerbation ([place]) Np edema, no rales and wheezes, don't think any component of CHF, doubt PNA, has been off abx only a few days ; O2 to keep sats> 90%, delsyn cough syrup, duonebs TID and q 4  prn; mucinex 600 mg BID all for 7 days; will monitor and add prednisone if needed.   Time spent > 35 min;> 50% of time with patient was spent reviewing records, labs, tests and studies, counseling and developing plan of care  Hennie Duos, MD

## 2015-06-19 DIAGNOSIS — I509 Heart failure, unspecified: Secondary | ICD-10-CM | POA: Diagnosis not present

## 2015-06-19 LAB — CBC AND DIFFERENTIAL
HCT: 36 % (ref 36–46)
Hemoglobin: 11.5 g/dL — AB (ref 12.0–16.0)
Platelets: 215 10*3/uL (ref 150–399)
WBC: 7.9 10*3/mL

## 2015-06-19 LAB — BASIC METABOLIC PANEL
BUN: 32 mg/dL — AB (ref 4–21)
CREATININE: 1.5 mg/dL — AB (ref 0.5–1.1)
GLUCOSE: 81 mg/dL
Potassium: 5.2 mmol/L (ref 3.4–5.3)
Sodium: 138 mmol/L (ref 137–147)

## 2015-06-19 LAB — HEPATIC FUNCTION PANEL
ALK PHOS: 67 U/L (ref 25–125)
ALT: 43 U/L — AB (ref 7–35)
AST: 61 U/L — AB (ref 13–35)
BILIRUBIN, TOTAL: 0.4 mg/dL

## 2015-06-21 ENCOUNTER — Encounter: Payer: Self-pay | Admitting: Internal Medicine

## 2015-06-21 DIAGNOSIS — J441 Chronic obstructive pulmonary disease with (acute) exacerbation: Secondary | ICD-10-CM | POA: Insufficient documentation

## 2015-06-21 NOTE — Assessment & Plan Note (Signed)
Np edema, no rales and wheezes, don't think any component of CHF, doubt PNA, has been off abx only a few days ; O2 to keep sats> 90%, delsyn cough syrup, duonebs TID and q 4 prn; mucinex 600 mg BID all for 7 days; will monitor and add prednisone if needed.

## 2015-06-22 ENCOUNTER — Inpatient Hospital Stay (HOSPITAL_COMMUNITY)
Admission: EM | Admit: 2015-06-22 | Discharge: 2015-06-26 | DRG: 190 | Disposition: A | Discharge status: Skilled Nursing Facility | Payer: Medicare Other | Attending: Internal Medicine | Admitting: Internal Medicine

## 2015-06-22 ENCOUNTER — Encounter (HOSPITAL_COMMUNITY): Payer: Self-pay

## 2015-06-22 ENCOUNTER — Emergency Department (HOSPITAL_COMMUNITY): Payer: Medicare Other

## 2015-06-22 DIAGNOSIS — R05 Cough: Secondary | ICD-10-CM | POA: Diagnosis not present

## 2015-06-22 DIAGNOSIS — R627 Adult failure to thrive: Secondary | ICD-10-CM | POA: Diagnosis present

## 2015-06-22 DIAGNOSIS — H9190 Unspecified hearing loss, unspecified ear: Secondary | ICD-10-CM | POA: Diagnosis present

## 2015-06-22 DIAGNOSIS — I130 Hypertensive heart and chronic kidney disease with heart failure and stage 1 through stage 4 chronic kidney disease, or unspecified chronic kidney disease: Secondary | ICD-10-CM | POA: Diagnosis present

## 2015-06-22 DIAGNOSIS — Z950 Presence of cardiac pacemaker: Secondary | ICD-10-CM | POA: Diagnosis present

## 2015-06-22 DIAGNOSIS — Y95 Nosocomial condition: Secondary | ICD-10-CM | POA: Diagnosis present

## 2015-06-22 DIAGNOSIS — Z8673 Personal history of transient ischemic attack (TIA), and cerebral infarction without residual deficits: Secondary | ICD-10-CM

## 2015-06-22 DIAGNOSIS — Z66 Do not resuscitate: Secondary | ICD-10-CM | POA: Diagnosis not present

## 2015-06-22 DIAGNOSIS — M810 Age-related osteoporosis without current pathological fracture: Secondary | ICD-10-CM | POA: Diagnosis present

## 2015-06-22 DIAGNOSIS — R630 Anorexia: Secondary | ICD-10-CM | POA: Diagnosis not present

## 2015-06-22 DIAGNOSIS — K219 Gastro-esophageal reflux disease without esophagitis: Secondary | ICD-10-CM | POA: Diagnosis present

## 2015-06-22 DIAGNOSIS — I4891 Unspecified atrial fibrillation: Secondary | ICD-10-CM | POA: Diagnosis present

## 2015-06-22 DIAGNOSIS — I5032 Chronic diastolic (congestive) heart failure: Secondary | ICD-10-CM | POA: Diagnosis not present

## 2015-06-22 DIAGNOSIS — R1310 Dysphagia, unspecified: Secondary | ICD-10-CM | POA: Diagnosis present

## 2015-06-22 DIAGNOSIS — N183 Chronic kidney disease, stage 3 (moderate): Secondary | ICD-10-CM | POA: Diagnosis not present

## 2015-06-22 DIAGNOSIS — Z681 Body mass index (BMI) 19 or less, adult: Secondary | ICD-10-CM | POA: Diagnosis not present

## 2015-06-22 DIAGNOSIS — E46 Unspecified protein-calorie malnutrition: Secondary | ICD-10-CM | POA: Diagnosis present

## 2015-06-22 DIAGNOSIS — I252 Old myocardial infarction: Secondary | ICD-10-CM | POA: Diagnosis not present

## 2015-06-22 DIAGNOSIS — J189 Pneumonia, unspecified organism: Secondary | ICD-10-CM | POA: Diagnosis not present

## 2015-06-22 DIAGNOSIS — E039 Hypothyroidism, unspecified: Secondary | ICD-10-CM | POA: Diagnosis present

## 2015-06-22 DIAGNOSIS — Z87891 Personal history of nicotine dependence: Secondary | ICD-10-CM

## 2015-06-22 DIAGNOSIS — J440 Chronic obstructive pulmonary disease with acute lower respiratory infection: Secondary | ICD-10-CM | POA: Diagnosis present

## 2015-06-22 DIAGNOSIS — Z7902 Long term (current) use of antithrombotics/antiplatelets: Secondary | ICD-10-CM | POA: Diagnosis not present

## 2015-06-22 DIAGNOSIS — I2510 Atherosclerotic heart disease of native coronary artery without angina pectoris: Secondary | ICD-10-CM | POA: Diagnosis not present

## 2015-06-22 DIAGNOSIS — Z955 Presence of coronary angioplasty implant and graft: Secondary | ICD-10-CM | POA: Diagnosis not present

## 2015-06-22 DIAGNOSIS — E038 Other specified hypothyroidism: Secondary | ICD-10-CM | POA: Diagnosis not present

## 2015-06-22 DIAGNOSIS — E860 Dehydration: Secondary | ICD-10-CM | POA: Diagnosis present

## 2015-06-22 DIAGNOSIS — I482 Chronic atrial fibrillation: Secondary | ICD-10-CM | POA: Diagnosis present

## 2015-06-22 DIAGNOSIS — R531 Weakness: Secondary | ICD-10-CM | POA: Diagnosis not present

## 2015-06-22 DIAGNOSIS — Z515 Encounter for palliative care: Secondary | ICD-10-CM | POA: Diagnosis not present

## 2015-06-22 DIAGNOSIS — H9193 Unspecified hearing loss, bilateral: Secondary | ICD-10-CM | POA: Diagnosis not present

## 2015-06-22 DIAGNOSIS — L309 Dermatitis, unspecified: Secondary | ICD-10-CM | POA: Diagnosis present

## 2015-06-22 DIAGNOSIS — R112 Nausea with vomiting, unspecified: Secondary | ICD-10-CM | POA: Diagnosis not present

## 2015-06-22 DIAGNOSIS — N189 Chronic kidney disease, unspecified: Secondary | ICD-10-CM | POA: Diagnosis present

## 2015-06-22 DIAGNOSIS — Z96643 Presence of artificial hip joint, bilateral: Secondary | ICD-10-CM | POA: Diagnosis present

## 2015-06-22 DIAGNOSIS — N390 Urinary tract infection, site not specified: Secondary | ICD-10-CM | POA: Diagnosis not present

## 2015-06-22 DIAGNOSIS — I480 Paroxysmal atrial fibrillation: Secondary | ICD-10-CM | POA: Diagnosis not present

## 2015-06-22 LAB — CBC WITH DIFFERENTIAL/PLATELET
BASOS PCT: 2 %
Basophils Absolute: 0.2 10*3/uL — ABNORMAL HIGH (ref 0.0–0.1)
Eosinophils Absolute: 0 10*3/uL (ref 0.0–0.7)
Eosinophils Relative: 0 %
HEMATOCRIT: 38.9 % (ref 36.0–46.0)
HEMOGLOBIN: 12.3 g/dL (ref 12.0–15.0)
LYMPHS ABS: 1.1 10*3/uL (ref 0.7–4.0)
LYMPHS PCT: 13 %
MCH: 30.6 pg (ref 26.0–34.0)
MCHC: 31.6 g/dL (ref 30.0–36.0)
MCV: 96.8 fL (ref 78.0–100.0)
MONOS PCT: 9 %
Monocytes Absolute: 0.8 10*3/uL (ref 0.1–1.0)
NEUTROS ABS: 6.6 10*3/uL (ref 1.7–7.7)
NEUTROS PCT: 76 %
Platelets: 197 10*3/uL (ref 150–400)
RBC: 4.02 MIL/uL (ref 3.87–5.11)
RDW: 14.3 % (ref 11.5–15.5)
WBC: 8.7 10*3/uL (ref 4.0–10.5)

## 2015-06-22 LAB — COMPREHENSIVE METABOLIC PANEL
ALBUMIN: 2.6 g/dL — AB (ref 3.5–5.0)
ALK PHOS: 89 U/L (ref 38–126)
ALT: 71 U/L — ABNORMAL HIGH (ref 14–54)
ANION GAP: 12 (ref 5–15)
AST: 123 U/L — ABNORMAL HIGH (ref 15–41)
BILIRUBIN TOTAL: 0.8 mg/dL (ref 0.3–1.2)
BUN: 33 mg/dL — AB (ref 6–20)
CALCIUM: 8.8 mg/dL — AB (ref 8.9–10.3)
CO2: 30 mmol/L (ref 22–32)
Chloride: 96 mmol/L — ABNORMAL LOW (ref 101–111)
Creatinine, Ser: 1.64 mg/dL — ABNORMAL HIGH (ref 0.44–1.00)
GFR calc Af Amer: 29 mL/min — ABNORMAL LOW (ref >60)
GFR calc non Af Amer: 25 mL/min — ABNORMAL LOW (ref >60)
GLUCOSE: 103 mg/dL — AB (ref 65–99)
Potassium: 4.3 mmol/L (ref 3.5–5.1)
Sodium: 138 mmol/L (ref 135–145)
TOTAL PROTEIN: 5.8 g/dL — AB (ref 6.5–8.1)

## 2015-06-22 LAB — URINALYSIS, ROUTINE W REFLEX MICROSCOPIC
Bilirubin Urine: NEGATIVE
GLUCOSE, UA: NEGATIVE mg/dL
KETONES UR: NEGATIVE mg/dL
Nitrite: POSITIVE — AB
PROTEIN: NEGATIVE mg/dL
Specific Gravity, Urine: 1.008 (ref 1.005–1.030)
pH: 5 (ref 5.0–8.0)

## 2015-06-22 LAB — URINE MICROSCOPIC-ADD ON

## 2015-06-22 MED ORDER — NITROGLYCERIN 0.4 MG SL SUBL: 0.4000 mg | SUBLINGUAL_TABLET | SUBLINGUAL | Status: DC | PRN

## 2015-06-22 MED ORDER — SODIUM CHLORIDE 0.9 % IV SOLN
INTRAVENOUS | Status: DC
  Administered 2015-06-22: 22:00:00 via INTRAVENOUS

## 2015-06-22 MED ORDER — DEXTROSE 5 % IV SOLN
1.0000 g | Freq: Once | INTRAVENOUS | Status: AC
  Administered 2015-06-22: 1 g via INTRAVENOUS
  Filled 2015-06-22: qty 1

## 2015-06-22 MED ORDER — LEVOTHYROXINE SODIUM 125 MCG PO TABS
125.0000 ug | ORAL_TABLET | Freq: Every day | ORAL | Status: DC
  Administered 2015-06-23: 125 ug via ORAL
  Filled 2015-06-22: qty 1

## 2015-06-22 MED ORDER — PANTOPRAZOLE SODIUM 40 MG PO TBEC
40.0000 mg | DELAYED_RELEASE_TABLET | Freq: Every day | ORAL | Status: DC
  Administered 2015-06-22 – 2015-06-26 (×5): 40 mg via ORAL
  Filled 2015-06-22 (×4): qty 1

## 2015-06-22 MED ORDER — NYSTATIN-TRIAMCINOLONE 100000-0.1 UNIT/GM-% EX OINT
TOPICAL_OINTMENT | Freq: Two times a day (BID) | CUTANEOUS | Status: DC
  Administered 2015-06-22 – 2015-06-26 (×8): via TOPICAL
  Filled 2015-06-22: qty 15

## 2015-06-22 MED ORDER — BIOTIN 1000 MCG PO TABS: 1000.0000 ug | ORAL_TABLET | Freq: Every day | ORAL | Status: DC

## 2015-06-22 MED ORDER — VANCOMYCIN HCL IN DEXTROSE 750-5 MG/150ML-% IV SOLN: 750.0000 mg | INTRAVENOUS | Status: DC

## 2015-06-22 MED ORDER — BOOST HIGH PROTEIN PO LIQD
1.0000 | ORAL | Status: DC
  Administered 2015-06-22: 237 mL via ORAL
  Filled 2015-06-22 (×5): qty 237

## 2015-06-22 MED ORDER — ACETAMINOPHEN 325 MG PO TABS
650.0000 mg | ORAL_TABLET | Freq: Four times a day (QID) | ORAL | Status: DC | PRN
  Administered 2015-06-22: 650 mg via ORAL
  Administered 2015-06-23 – 2015-06-24 (×2): 325 mg via ORAL
  Administered 2015-06-25: 650 mg via ORAL
  Filled 2015-06-22 (×4): qty 2

## 2015-06-22 MED ORDER — SODIUM CHLORIDE 0.9 % IV BOLUS (SEPSIS)
500.0000 mL | Freq: Once | INTRAVENOUS | Status: AC
  Administered 2015-06-22: 500 mL via INTRAVENOUS

## 2015-06-22 MED ORDER — ENSURE ENLIVE PO LIQD
237.0000 mL | Freq: Two times a day (BID) | ORAL | Status: DC
  Administered 2015-06-23: 237 mL via ORAL

## 2015-06-22 MED ORDER — VITAMIN B-12 1000 MCG PO TABS
1000.0000 ug | ORAL_TABLET | Freq: Every day | ORAL | Status: DC
  Administered 2015-06-23 – 2015-06-26 (×4): 1000 ug via ORAL
  Filled 2015-06-22 (×4): qty 1

## 2015-06-22 MED ORDER — CLOPIDOGREL BISULFATE 75 MG PO TABS
75.0000 mg | ORAL_TABLET | Freq: Every day | ORAL | Status: DC
  Administered 2015-06-23 – 2015-06-26 (×4): 75 mg via ORAL
  Filled 2015-06-22 (×4): qty 1

## 2015-06-22 MED ORDER — POTASSIUM CHLORIDE CRYS ER 20 MEQ PO TBCR
20.0000 meq | EXTENDED_RELEASE_TABLET | Freq: Every day | ORAL | Status: DC
  Administered 2015-06-22 – 2015-06-23 (×2): 20 meq via ORAL
  Filled 2015-06-22 (×2): qty 1

## 2015-06-22 MED ORDER — VANCOMYCIN HCL IN DEXTROSE 1-5 GM/200ML-% IV SOLN
1000.0000 mg | Freq: Once | INTRAVENOUS | Status: AC
  Administered 2015-06-23: 1000 mg via INTRAVENOUS
  Filled 2015-06-22: qty 200

## 2015-06-22 MED ORDER — FERROUS SULFATE 325 (65 FE) MG PO TABS
325.0000 mg | ORAL_TABLET | Freq: Every day | ORAL | Status: DC
  Administered 2015-06-23 – 2015-06-26 (×4): 325 mg via ORAL
  Filled 2015-06-22 (×4): qty 1

## 2015-06-22 MED ORDER — ENOXAPARIN SODIUM 30 MG/0.3ML ~~LOC~~ SOLN
30.0000 mg | SUBCUTANEOUS | Status: DC
  Administered 2015-06-22 – 2015-06-25 (×4): 30 mg via SUBCUTANEOUS
  Filled 2015-06-22 (×5): qty 0.3

## 2015-06-22 MED ORDER — ONDANSETRON HCL 4 MG/2ML IJ SOLN: 4.0000 mg | Freq: Four times a day (QID) | INTRAMUSCULAR | Status: DC | PRN

## 2015-06-22 MED ORDER — DEXTROSE 5 % IV SOLN
1.0000 g | INTRAVENOUS | Status: DC
  Administered 2015-06-23 – 2015-06-24 (×2): 1 g via INTRAVENOUS
  Filled 2015-06-22 (×3): qty 1

## 2015-06-22 MED ORDER — ROPINIROLE HCL 0.5 MG PO TABS
0.2500 mg | ORAL_TABLET | Freq: Every day | ORAL | Status: DC
  Administered 2015-06-22 – 2015-06-25 (×4): 0.25 mg via ORAL
  Filled 2015-06-22 (×4): qty 1

## 2015-06-22 MED ORDER — AMIODARONE HCL 200 MG PO TABS
200.0000 mg | ORAL_TABLET | Freq: Every day | ORAL | Status: DC
  Administered 2015-06-23 – 2015-06-26 (×4): 200 mg via ORAL
  Filled 2015-06-22 (×4): qty 1

## 2015-06-22 NOTE — ED Notes (Signed)
Pt. Coming from [facility] via [facility] c/o not eating for the past few days. Pt. sts she just doesn't feel well. The facility were going to send for tests and labs, but family requested transfer to [facility]. Pt. Also c/o nausea intermittently, but denies pain. Pt. Noted to have redness to vagina and sacral area. Pt. On 2L oxygen all the time. Pt. Aox4. Family at bedside.

## 2015-06-22 NOTE — ED Provider Notes (Addendum)
CSN: [id number]     Arrival date & time 06/22/15  1538 History   First MD Initiated Contact with Patient 06/22/15 1632     Chief Complaint  Patient presents with  . Eating Disorder    decreased appetite/not eating      (Consider location/radiation/quality/duration/timing/severity/associated sxs/prior Treatment) HPI...Marland KitchenMarland KitchenLevel V caveat for mild dementia. Family reports patient has been sick for 2-3 weeks.  She originally got hypothermic. Review systems positive for cough, weakness, decreased eating, decreased drinking. She apparently has been on Augmentin. Past medical history well documented below.  Past Medical History  Diagnosis Date  . GI bleed 2007  . Hypertension   . TIA (transient ischemic attack)   . Hypothyroidism   . Abdominal aortic aneurysm ([place])   . CAD (coronary artery disease)   . SSS (sick sinus syndrome) ([place]) 10/05/2007    Medtronic Adapta  . Myocardial infarction ([place])   . Atrial fibrillation ([place])   . COPD (chronic obstructive pulmonary disease) ([place])   . Asthma     hx  . Anemia     hx  . Blood transfusion   . UTI (lower urinary tract infection)     hx  . Renal failure, chronic   . H/O hiatal hernia   . GERD (gastroesophageal reflux disease)   . Stroke ([place])   . Osteoporosis   . Blood transfusion without reported diagnosis   . Pacemaker    Past Surgical History  Procedure Laterality Date  . Pacemaker insertion  10/05/2007    Medtronic Adapta  . Coronary stent placement      x9  . Cardiac catheterization    . Fracture surgery      femur fx, /w ORIF into HIP- 2008  . Total hip arthroplasty      planned for 08/27/2011 Left  . Total hip arthroplasty  08/27/2011    Procedure: TOTAL HIP ARTHROPLASTY;  Surgeon: Kerin Salen, MD;  Location: [facility];  Service: Orthopedics;  Laterality: Right;  . US echocardiography  08/23/2011    EF 50%,LA mod. dilated,mild to mod. mitral annular ca+,mod. MR,mild to mod TR,mild to mod. PH,trace AI.  [name] Nm myocar perf wall  motion  10/08/2010    mild apical anterior ischemia  . Colonoscopy     Family History  Problem Relation Age of Onset  . Emphysema Father   . Heart disease Mother   . Cancer Brother   . Cancer Brother   . Cancer Sister   . Cancer Sister   . Diabetes Daughter   . Atrial fibrillation Daughter   . Atrial fibrillation Daughter   . Stroke Daughter   . Heart Problems Son    Social History  Substance Use Topics  . Smoking status: Former Smoker    Types: Cigarettes  . Smokeless tobacco: Never Used     Comment: quit 40 yrs ago- was smoking 2 packs a day at the most  . Alcohol Use: No   OB History    No data available     Review of Systems  Reason unable to perform ROS: Mild dementia.      Allergies  Nubain; Mirtazapine; Statins; and Iodine  Home Medications   Prior to Admission medications   Medication Sig Start Date End Date Taking? Authorizing Provider  acetaminophen (TYLENOL) 325 MG tablet Take 2 tablets (650 mg total) by mouth every 6 (six) hours as needed for mild pain (or Fever >/= 101). 02/01/15   Bonnielee Haff, MD  amiodarone (PACERONE) 200 MG  tablet TAKE 1 TABLET BY MOUTH EVERY DAY 02/10/15   Sanda Klein, MD  Biotin 1000 MCG tablet Take 1,000 mcg by mouth daily.    Historical Provider, MD  Calcium Carb-Cholecalciferol (CALCIUM 600 + D PO) Take 1 tablet by mouth daily.    Historical Provider, MD  clopidogrel (PLAVIX) 75 MG tablet TAKE 1 TABLET (75 MG TOTAL) BY MOUTH DAILY. 01/28/15   Mihai Croitoru, MD  feeding supplement (BOOST HIGH PROTEIN) LIQD Take 1 Container by mouth daily.    Historical Provider, MD  ferrous sulfate 325 (65 FE) MG tablet Take 325 mg by mouth daily with breakfast.     Historical Provider, MD  furosemide (LASIX) 20 MG tablet Take 1 tablet (20 mg total) by mouth daily. 11/07/14   Domenic Polite, MD  levothyroxine (SYNTHROID, LEVOTHROID) 125 MCG tablet TAKE 1 TABLET BY MOUTH EVERY DAY 10/30/13   Lorretta Harp, MD  loperamide (IMODIUM) 2 MG  capsule Take 1 capsule (2 mg total) by mouth daily as needed for diarrhea or loose stools. 02/01/15   Bonnielee Haff, MD  nitroGLYCERIN (NITROSTAT) 0.4 MG SL tablet Place 1 tablet (0.4 mg total) under the tongue every 5 (five) minutes as needed for chest pain (For a total of 3 tablets, if chest pain persist to call 911). 10/15/14   Lauree Chandler, NP  pantoprazole (PROTONIX) 40 MG tablet TAKE 1 TABLET (40 MG TOTAL) BY MOUTH DAILY. 11/11/14   Lauree Chandler, NP  potassium chloride SA (K-DUR,KLOR-CON) 20 MEQ tablet Take 20 mEq by mouth daily.    Historical Provider, MD  saccharomyces boulardii (FLORASTOR) 250 MG capsule Take 1 capsule (250 mg total) by mouth 2 (two) times daily. 02/01/15   Bonnielee Haff, MD  vitamin B-12 (CYANOCOBALAMIN) 1000 MCG tablet Take 1,000 mcg by mouth daily.    Historical Provider, MD   BP 104/54 mmHg  Pulse 88  Temp(Src) 98 F (36.7 C) (Oral)  Resp 16  Ht 5\' 4"  (1.626 m)  Wt 112 lb (50.803 kg)  BMI 19.22 kg/m2  SpO2 100% Physical Exam  Constitutional:  Frail, pale  HENT:  Head: Normocephalic and atraumatic.  Eyes: Conjunctivae and EOM are normal. Pupils are equal, round, and reactive to light.  Neck: Normal range of motion. Neck supple.  Cardiovascular: Normal rate and regular rhythm.   Pulmonary/Chest: Effort normal and breath sounds normal.  Abdominal: Soft. Bowel sounds are normal.  Musculoskeletal: Normal range of motion.  Neurological: She is alert.  Skin: Skin is warm and dry.  Psychiatric:  Flat affect  Nursing note and vitals reviewed.   ED Course  Procedures (including critical care time) Labs Review Labs Reviewed  CBC WITH DIFFERENTIAL/PLATELET - Abnormal; Notable for the following:    Basophils Absolute 0.2 (*)    All other components within normal limits  COMPREHENSIVE METABOLIC PANEL - Abnormal; Notable for the following:    Chloride 96 (*)    Glucose, Bld 103 (*)    BUN 33 (*)    Creatinine, Ser 1.64 (*)    Calcium 8.8 (*)     Total Protein 5.8 (*)    Albumin 2.6 (*)    AST 123 (*)    ALT 71 (*)    GFR calc non Af Amer 25 (*)    GFR calc Af Amer 29 (*)    All other components within normal limits  URINALYSIS, ROUTINE W REFLEX MICROSCOPIC (NOT AT [facility]) - Abnormal; Notable for the following:    APPearance CLOUDY (*)  Hgb urine dipstick SMALL (*)    Nitrite POSITIVE (*)    Leukocytes, UA TRACE (*)    All other components within normal limits  URINE MICROSCOPIC-ADD ON - Abnormal; Notable for the following:    Squamous Epithelial / LPF 0-5 (*)    Bacteria, UA MANY (*)    Casts HYALINE CASTS (*)    All other components within normal limits    Imaging Review Dg Chest Port 1 View  06/22/2015  CLINICAL DATA:  Cough and decreased appetite EXAM: PORTABLE CHEST 1 VIEW COMPARISON:  03/10/2015 FINDINGS: Cardiac shadow is again enlarged. Pacing device is again seen. The lungs are well aerated bilaterally. Some early right basilar infiltrate is noted. No acute bony abnormality is seen. IMPRESSION: Early right basilar infiltrate. Electronically Signed   By: Inez Catalina M.D.   On: 06/22/2015 17:28   I have personally reviewed and evaluated these images and lab results as part of my medical decision-making.   EKG Interpretation None      MDM   Final diagnoses:  HCAP (healthcare-associated pneumonia)    Chest x-ray reveals a right basilar infiltrate. Rx antibiotics for healthcare associated pneumonia. Admit to general medicine.    Nat Christen, MD 06/22/15 [place], MD 06/22/15 [phone]

## 2015-06-22 NOTE — ED Notes (Signed)
RN and Tech attempted in-and-out cath unsuccessfully. Redness noted surround vagina and buttocks. Pt. sts it is very painful for her. Barrier cream applied at this time.

## 2015-06-22 NOTE — H&P (Signed)
History and Physical  [name] [id number] DOB: [birth year] DOA: 06/22/2015  PCP: Lauree Chandler, NP   Chief Complaint: fatigue, cough  History of Present Illness:  Patient is a 82 yo female with history of CAD, PVD, Afib, SSS on PPM, TIA, COPD, hypothyroidism, CKD, hernia who came with cc of cough and fatigue for 3 weeks. She stays at [facility] and has been complaining of cough/fatigue for 3 weeks so UA was done 2 weeks ago and was suggestive of a UTI per daughters ("mild") so she was given Augmentin for 10 days which she likely did no keep in her stomach as she was also having nausea and vomiting. She had two CXRs last week which were unremarkable per daughters. Due to worsening symptoms they decided to bring her to the ER. She has had no fever or chills. She had some preceding URI symptoms. She had no abdominal pain or diarrhea. She did not complain of dysuria but has had dryness/erythema in her vaginal/perineal area. Otherwise she has no complaints.   Review of Systems:  CONSTITUTIONAL:  No night sweats.  +fatigue, malaise, lethargy.  No fever or chills. Eyes:  No visual changes.  No eye pain.  No eye discharge.   ENT:    No epistaxis.  No sinus pain.  No sore throat.  No ear pain.  No congestion. RESPIRATORY:  +cough.  No wheeze.  No hemoptysis.  No shortness of breath. CARDIOVASCULAR:  No chest pains.  No palpitations. GASTROINTESTINAL:  No abdominal pain.  +nausea +vomiting.  No diarrhea or constipation.  No hematemesis.  No hematochezia.  No melena. GENITOURINARY:  No urgency.  No frequency.  No dysuria.  No hematuria.  No obstructive symptoms.  No discharge.  No pain.  No significant abnormal bleeding. MUSCULOSKELETAL:  No musculoskeletal pain.  No joint swelling.  No arthritis. NEUROLOGICAL:  No confusion.  +weakness. No headache. No seizure. PSYCHIATRIC:  No depression. No anxiety. No suicidal ideation. SKIN:  +rashes.  +lesions.  No wounds. ENDOCRINE:  No unexplained  weight loss.  No polydipsia.  No polyuria.  No polyphagia. HEMATOLOGIC:  No anemia.  No purpura.  No petechiae.  No bleeding.  ALLERGIC AND IMMUNOLOGIC:  No pruritus.  No swelling Other:  Past Medical and Surgical History:   Past Medical History  Diagnosis Date  . GI bleed 2007  . Hypertension   . TIA (transient ischemic attack)   . Hypothyroidism   . Abdominal aortic aneurysm ([place])   . CAD (coronary artery disease)   . SSS (sick sinus syndrome) ([place]) 10/05/2007    Medtronic Adapta  . Myocardial infarction ([place])   . Atrial fibrillation ([place])   . COPD (chronic obstructive pulmonary disease) ([place])   . Asthma     hx  . Anemia     hx  . Blood transfusion   . UTI (lower urinary tract infection)     hx  . Renal failure, chronic   . H/O hiatal hernia   . GERD (gastroesophageal reflux disease)   . Stroke ([place])   . Osteoporosis   . Blood transfusion without reported diagnosis   . Pacemaker    Past Surgical History  Procedure Laterality Date  . Pacemaker insertion  10/05/2007    Medtronic Adapta  . Coronary stent placement      x9  . Cardiac catheterization    . Fracture surgery      femur fx, /w ORIF into HIP- 2008  . Total hip arthroplasty  planned for 08/27/2011 Left  . Total hip arthroplasty  08/27/2011    Procedure: TOTAL HIP ARTHROPLASTY;  Surgeon: Kerin Salen, MD;  Location: [facility];  Service: Orthopedics;  Laterality: Right;  . US echocardiography  08/23/2011    EF 50%,LA mod. dilated,mild to mod. mitral annular ca+,mod. MR,mild to mod TR,mild to mod. PH,trace AI.  [name] Nm myocar perf wall motion  10/08/2010    mild apical anterior ischemia  . Colonoscopy      Social History:   reports that she has quit smoking. Her smoking use included Cigarettes. She has never used smokeless tobacco. She reports that she does not drink alcohol or use illicit drugs.   Allergies  Allergen Reactions  . Nubain [Nalbuphine Hcl] Anaphylaxis  . Mirtazapine     "Weakness in legs"  .  Statins     Leg weakness  . Iodine Rash    Family History  Problem Relation Age of Onset  . Emphysema Father   . Heart disease Mother   . Cancer Brother   . Cancer Brother   . Cancer Sister   . Cancer Sister   . Diabetes Daughter   . Atrial fibrillation Daughter   . Atrial fibrillation Daughter   . Stroke Daughter   . Heart Problems Son       Prior to Admission medications   Medication Sig Start Date End Date Taking? Authorizing Provider  acetaminophen (TYLENOL) 325 MG tablet Take 2 tablets (650 mg total) by mouth every 6 (six) hours as needed for mild pain (or Fever >/= 101). 02/01/15   Bonnielee Haff, MD  amiodarone (PACERONE) 200 MG tablet TAKE 1 TABLET BY MOUTH EVERY DAY 02/10/15   Mihai Croitoru, MD  Biotin 1000 MCG tablet Take 1,000 mcg by mouth daily.    Historical Provider, MD  Calcium Carb-Cholecalciferol (CALCIUM 600 + D PO) Take 1 tablet by mouth daily.    Historical Provider, MD  clopidogrel (PLAVIX) 75 MG tablet TAKE 1 TABLET (75 MG TOTAL) BY MOUTH DAILY. 01/28/15   Mihai Croitoru, MD  feeding supplement (BOOST HIGH PROTEIN) LIQD Take 1 Container by mouth daily.    Historical Provider, MD  ferrous sulfate 325 (65 FE) MG tablet Take 325 mg by mouth daily with breakfast.     Historical Provider, MD  furosemide (LASIX) 20 MG tablet Take 1 tablet (20 mg total) by mouth daily. 11/07/14   Domenic Polite, MD  levothyroxine (SYNTHROID, LEVOTHROID) 125 MCG tablet TAKE 1 TABLET BY MOUTH EVERY DAY 10/30/13   Lorretta Harp, MD  loperamide (IMODIUM) 2 MG capsule Take 1 capsule (2 mg total) by mouth daily as needed for diarrhea or loose stools. 02/01/15   Bonnielee Haff, MD  nitroGLYCERIN (NITROSTAT) 0.4 MG SL tablet Place 1 tablet (0.4 mg total) under the tongue every 5 (five) minutes as needed for chest pain (For a total of 3 tablets, if chest pain persist to call 911). 10/15/14   Lauree Chandler, NP  pantoprazole (PROTONIX) 40 MG tablet TAKE 1 TABLET (40 MG TOTAL) BY MOUTH DAILY.  11/11/14   Lauree Chandler, NP  potassium chloride SA (K-DUR,KLOR-CON) 20 MEQ tablet Take 20 mEq by mouth daily.    Historical Provider, MD  saccharomyces boulardii (FLORASTOR) 250 MG capsule Take 1 capsule (250 mg total) by mouth 2 (two) times daily. 02/01/15   Bonnielee Haff, MD  vitamin B-12 (CYANOCOBALAMIN) 1000 MCG tablet Take 1,000 mcg by mouth daily.    Historical Provider, MD  Physical Exam: BP 101/42 mmHg  Pulse 72  Temp(Src) 98 F (36.7 C) (Oral)  Resp 16  Ht 5\' 4"  (1.626 m)  Wt 50.803 kg (112 lb)  BMI 19.22 kg/m2  SpO2 100%  GENERAL :  appears to be in no acute distress. HEAD: normocephalic. EYES: PERRL, EOMI.  EARS:  hearing grossly intact. NOSE: No nasal discharge. THROAT: Oral cavity and pharynx normal.  NECK: Neck supple CARDIAC: Normal S1 and S2. No S3, S4 or murmurs.  LUNGS: mild crackles in right lung.  ABDOMEN: Positive bowel sounds. Soft, nondistended, nontender. Palpable aneurysm  EXTREMITIES: No significant deformity or joint abnormality.  NEUROLOGICAL: The mental examination revealed the patient was oriented to person, place, and time.CN II-XII intact. SKIN: erythematous perineal area with no discharge.            Labs on Admission:  Reviewed.   Radiological Exams on Admission: Dg Chest Port 1 View  06/22/2015  CLINICAL DATA:  Cough and decreased appetite EXAM: PORTABLE CHEST 1 VIEW COMPARISON:  03/10/2015 FINDINGS: Cardiac shadow is again enlarged. Pacing device is again seen. The lungs are well aerated bilaterally. Some early right basilar infiltrate is noted. No acute bony abnormality is seen. IMPRESSION: Early right basilar infiltrate. Electronically Signed   By: Inez Catalina M.D.   On: 06/22/2015 17:28     Assessment/Plan  HCAP: Patient failed management as an outpatient Vanc/Zosyn started per pharmacy dosing.  Will start IVF due to dehydration /vomiting : gentle at 75 cc/hr and monitor I/O.  Afib: continue plavix and amio  Elevated  liver enzymes: repeat in am. Workup as an outpatient.   CKD : cr stable.   CHF: hold lasix for now  CAD: cont plavix.   Vulvar /perineal erythematous/inflmmatory lesion: looking eczematous. Neg for candidal infection. Will give topical triamcinolone and watch.   DVT prophylaxis: [place] enoxaparin  GI prophylaxis: PPI Code Status: DNR/DNI per family     Gennaro Africa M.D [facility]

## 2015-06-22 NOTE — ED Notes (Signed)
Pt. Reassessed after 537mL fluid bolus. Lung sounds clear at this time.

## 2015-06-22 NOTE — Progress Notes (Signed)
ANTIBIOTIC CONSULT NOTE - INITIAL  Pharmacy Consult:  Vancomycin / Cefepime Indication:  HCAP  Allergies  Allergen Reactions  . Nubain [Nalbuphine Hcl] Anaphylaxis  . Mirtazapine     "Weakness in legs"  . Statins     Leg weakness  . Iodine Rash    Patient Measurements: Height: 5\' 4"  (162.6 cm) Weight: 112 lb (50.803 kg) IBW/kg (Calculated) : 54.7  Vital Signs: Temp: 98 F (36.7 C) (04/09 1551) Temp Source: Oral (04/09 1551) BP: 126/69 mmHg (04/09 1551) Pulse Rate: 89 (04/09 1551)  Labs:  Recent Labs  06/22/15 1609  WBC 8.7  HGB 12.3  PLT 197  CREATININE 1.64*   Estimated Creatinine Clearance: 16.1 mL/min (by C-G formula based on Cr of 1.64). No results for input(s): VANCOTROUGH, VANCOPEAK, VANCORANDOM, GENTTROUGH, GENTPEAK, GENTRANDOM, TOBRATROUGH, TOBRAPEAK, TOBRARND, AMIKACINPEAK, AMIKACINTROU, AMIKACIN in the last 72 hours.   Microbiology: No results found for this or any previous visit (from the past 720 hour(s)).  Medical History: Past Medical History  Diagnosis Date  . GI bleed 2007  . Hypertension   . TIA (transient ischemic attack)   . Hypothyroidism   . Abdominal aortic aneurysm ([place])   . CAD (coronary artery disease)   . SSS (sick sinus syndrome) ([place]) 10/05/2007    Medtronic Adapta  . Myocardial infarction ([place])   . Atrial fibrillation ([place])   . COPD (chronic obstructive pulmonary disease) ([place])   . Asthma     hx  . Anemia     hx  . Blood transfusion   . UTI (lower urinary tract infection)     hx  . Renal failure, chronic   . H/O hiatal hernia   . GERD (gastroesophageal reflux disease)   . Stroke ([place])   . Osteoporosis   . Blood transfusion without reported diagnosis   . Pacemaker      Assessment: 89 YOF from SNF via [facility] with complaint of decreased PO intake.  Pharmacy consulted to initiate vancomycin and cefepime for HCAP.  Baseline labs reviewed.  Vanc 4/9 >> Cefepime 4/9 >>   Goal of Therapy:  Vancomycin trough level  15-20 mcg/ml   Plan:  - Vanc 1gm IV x 1, then 750mg  IV Q48H - Cefepime 1gm IV Q24H - Monitor renal fxn, clinical progress, vanc trough as indicated   Monifah Freehling D. Mina Marble, PharmD, BCPS Pager:  [phone] 06/22/2015, 6:31 PM

## 2015-06-23 LAB — CBC WITH DIFFERENTIAL/PLATELET
BASOS ABS: 0 10*3/uL (ref 0.0–0.1)
Basophils Relative: 0 %
EOS PCT: 1 %
Eosinophils Absolute: 0.1 10*3/uL (ref 0.0–0.7)
HCT: 34.5 % — ABNORMAL LOW (ref 36.0–46.0)
Hemoglobin: 10.7 g/dL — ABNORMAL LOW (ref 12.0–15.0)
LYMPHS ABS: 1.2 10*3/uL (ref 0.7–4.0)
LYMPHS PCT: 20 %
MCH: 30 pg (ref 26.0–34.0)
MCHC: 31 g/dL (ref 30.0–36.0)
MCV: 96.6 fL (ref 78.0–100.0)
MONO ABS: 0.6 10*3/uL (ref 0.1–1.0)
Monocytes Relative: 10 %
Neutro Abs: 4.3 10*3/uL (ref 1.7–7.7)
Neutrophils Relative %: 69 %
PLATELETS: 178 10*3/uL (ref 150–400)
RBC: 3.57 MIL/uL — ABNORMAL LOW (ref 3.87–5.11)
RDW: 14.5 % (ref 11.5–15.5)
WBC: 6.2 10*3/uL (ref 4.0–10.5)

## 2015-06-23 LAB — COMPREHENSIVE METABOLIC PANEL
ALBUMIN: 2.1 g/dL — AB (ref 3.5–5.0)
ALT: 60 U/L — AB (ref 14–54)
AST: 98 U/L — AB (ref 15–41)
Alkaline Phosphatase: 66 U/L (ref 38–126)
Anion gap: 7 (ref 5–15)
BILIRUBIN TOTAL: 0.9 mg/dL (ref 0.3–1.2)
BUN: 27 mg/dL — AB (ref 6–20)
CO2: 32 mmol/L (ref 22–32)
CREATININE: 1.5 mg/dL — AB (ref 0.44–1.00)
Calcium: 8.2 mg/dL — ABNORMAL LOW (ref 8.9–10.3)
Chloride: 105 mmol/L (ref 101–111)
GFR calc Af Amer: 33 mL/min — ABNORMAL LOW (ref >60)
GFR calc non Af Amer: 28 mL/min — ABNORMAL LOW (ref >60)
GLUCOSE: 88 mg/dL (ref 65–99)
POTASSIUM: 4.6 mmol/L (ref 3.5–5.1)
Sodium: 144 mmol/L (ref 135–145)
TOTAL PROTEIN: 4.8 g/dL — AB (ref 6.5–8.1)

## 2015-06-23 LAB — MRSA PCR SCREENING: MRSA by PCR: NEGATIVE

## 2015-06-23 MED ORDER — LEVOTHYROXINE SODIUM 100 MCG IV SOLR
60.0000 ug | Freq: Every day | INTRAVENOUS | Status: DC
  Administered 2015-06-23 – 2015-06-24 (×2): 60 ug via INTRAVENOUS
  Filled 2015-06-23 (×2): qty 5

## 2015-06-23 MED ORDER — VANCOMYCIN HCL IN DEXTROSE 750-5 MG/150ML-% IV SOLN: 750.0000 mg | INTRAVENOUS | Status: DC

## 2015-06-23 NOTE — Clinical Documentation Improvement (Signed)
Internal Medicine   Can the diagnosis of CHF be further specified? Thank you    Acuity - Acute, Chronic, Acute on Chronic   Type - Systolic, Diastolic, Systolic and Diastolic  Other  Clinically Undetermined   Supporting Information: HTN , PNA, CKD    Please exercise your independent, professional judgment when responding. A specific answer is not anticipated or expected.   Thank You,  [place] [phone]

## 2015-06-23 NOTE — Clinical Social Work Note (Signed)
Clinical Social Work Assessment  Patient Details  Name: [name] MRN: [id number] Date of Birth: Mar 23, 1917  Date of referral:  06/23/15               Reason for consult:  Facility Placement                Permission sought to share information with:  Facility Sport and exercise psychologist, Family Supports Permission granted to share information::  No (Patient disoriented; Completed assessment w/ daughter [name])  Name::     [name]  Agency::  [facility]  Relationship::  Daughter  Contact Information:  [phone]  Housing/Transportation Living arrangements for the past 2 months:  [place] Information:  Adult Children Patient Interpreter Needed:  None Criminal Activity/Legal Involvement Pertinent to Current Situation/Hospitalization:  No - Comment as needed Significant Relationships:  Adult Children Lives with:  Self Do you feel safe going back to the place where you live?  Yes Need for family participation in patient care:  Yes (Comment)  Care giving concerns:  CSW received consult regarding return to SNF at discharge. Patient is disoriented. CSW completed assessment with patient's daughter, [name]. [name] stated that patient is a long term care resident at [facility]. Patient plans to return there at discharge.    Social Worker assessment / plan:  Patient to return to [facility] at discharge.   Employment status:  Retired Forensic scientist:  Medicaid In [place], [place] PT Recommendations:  Not assessed at this time Information / Referral to community resources:  [place]  Patient/Family's Response to care:  Patient's daughter expressed concern that patient was cold at SNF and that is why she is now sick. Patient's daughter is in agreement for patient to return to [facility] at discharge, but does eventually want patient to return home once stronger.   Patient/Family's Understanding of and Emotional Response to Diagnosis,  Current Treatment, and Prognosis:  No questions or concerns at this time.  Emotional Assessment Appearance:  Appears stated age Attitude/Demeanor/Rapport:  Unable to Assess Affect (typically observed):  Unable to Assess Orientation:  Oriented to Self, Oriented to Place Alcohol / Substance use:  Not Applicable Psych involvement (Current and /or in the community):  No (Comment)  Discharge Needs  Concerns to be addressed:  Care Coordination Readmission within the last 30 days:  No Current discharge risk:  None Barriers to Discharge:  Continued Medical Work up   [employer], [place] 06/23/2015, 6:06 PM

## 2015-06-23 NOTE — NC FL2 (Signed)
[place] MEDICAID FL2 LEVEL OF CARE SCREENING TOOL     IDENTIFICATION  Patient Name: [name] Birthdate: 05/04/1917 Sex: female Admission Date (Current Location): 06/22/2015  [facility] and [place] Number:  Herbalist and Address:  The [place], [place] [address], [place], [place] [address]      Provider Number: [id number]  Attending Physician Name and Address:  Elmarie Shiley, MD  Relative Name and Phone Number:  Eduard Clos    Current Level of Care: Hospital Recommended Level of Care: [place] Prior Approval Number:    Date Approved/Denied:   PASRR Number:    Discharge Plan:      Current Diagnoses: Patient Active Problem List   Diagnosis Date Noted  . HCAP (healthcare-associated pneumonia) 06/22/2015  . Pneumonia 06/22/2015  . COPD exacerbation ([place]) 06/21/2015  . Cough 06/09/2015  . Fever, unspecified 06/09/2015  . Chronic diastolic congestive heart failure ([place]) 04/24/2015  . Restless legs 04/24/2015  . Restless leg 04/24/2015  . Pain and swelling of left lower extremity 02/15/2015  . Candidiasis of perineum 02/15/2015  . Hemorrhoids 02/15/2015  . CKD (chronic kidney disease) stage 3, GFR 30-59 ml/min 02/06/2015  . Acute respiratory failure with hypoxia ([place]) 01/29/2015  . Acute pulmonary edema ([place]) 01/29/2015  . SOB (shortness of breath)   . Palliative care encounter   . Sepsis ([place]) 01/26/2015  . Cellulitis of leg, left 01/26/2015  . Protein-calorie malnutrition, severe ([place]) 11/06/2014  . Pressure ulcer 11/06/2014  . Acute diastolic CHF (congestive heart failure) ([place]) 11/04/2014  . Weakness 11/04/2014  . Hypertensive heart disease with CHF (congestive heart failure) ([place]) 11/04/2014  . Macrocytic anemia 11/04/2014  . Cardiomyopathy, ischemic 11/04/2014  . CHF exacerbation ([place]) 11/03/2014  . Body mass index (BMI) of 20.0-20.9 in adult 06/23/2014  . Hard of hearing 06/23/2014  .  Rectal bleeding 07/16/2013  . Hyperlipidemia, statin intolerance 04/30/2013  . Paroxysmal atrial fibrillation ([place]) 04/30/2013  . Pacemaker - dual chamber Medtronic 2009 04/30/2013  . AAA (abdominal aortic aneurysm) ([place]) 04/30/2013  . Bilateral carotid artery stenosis 04/30/2013  . PAD (peripheral artery disease) ([place]) 04/30/2013  . Constipation 08/31/2011  . Hypotension 08/28/2011  . S/P total hip arthroplasty 08/28/2011  . CAD (coronary artery disease) 08/28/2011  . Hypothyroid 08/28/2011  . Klebsiella cystitis 08/28/2011  . Thrombocytopenia ([place]) 08/28/2011  . SSS (sick sinus syndrome) ([place]) 10/05/2007    Orientation RESPIRATION BLADDER Height & Weight     Self, Place, Situation, Time  Normal Continent Weight: 107 lb 9.6 oz (48.807 kg) Height:  5\' 1"  (154.9 cm)  BEHAVIORAL SYMPTOMS/MOOD NEUROLOGICAL BOWEL NUTRITION STATUS      Continent Diet (Please see DC summary)  AMBULATORY STATUS COMMUNICATION OF NEEDS Skin   Extensive Assist Verbally Normal                       Personal Care Assistance Level of Assistance  Bathing, Feeding, Dressing Bathing Assistance: Maximum assistance Feeding assistance: Independent Dressing Assistance: Limited assistance     Functional Limitations Info  Hearing   Hearing Info: Impaired      SPECIAL CARE FACTORS FREQUENCY                       Contractures      Additional Factors Info  Code Status, Allergies Code Status Info: DNR Allergies Info: Nubain, Mirtazapine, Pineapple, Statins, Iodine  Current Medications (06/23/2015):  This is the current hospital active medication list Current Facility-Administered Medications  Medication Dose Route Frequency Provider Last Rate Last Dose  . 0.9 %  sodium chloride infusion   Intravenous Continuous Gennaro Africa, MD 75 mL/hr at 06/23/15 0200    . acetaminophen (TYLENOL) tablet 650 mg  650 mg Oral Q6H PRN Gennaro Africa, MD   650 mg at 06/22/15 2207  . amiodarone  (PACERONE) tablet 200 mg  200 mg Oral Daily Gennaro Africa, MD   200 mg at 06/23/15 0905  . ceFEPIme (MAXIPIME) 1 g in dextrose 5 % 50 mL IVPB  1 g Intravenous Q24H Thuy D Dang, RPH   1 g at 06/23/15 1800  . clopidogrel (PLAVIX) tablet 75 mg  75 mg Oral Daily Gennaro Africa, MD   75 mg at 06/23/15 0904  . enoxaparin (LOVENOX) injection 30 mg  30 mg Subcutaneous Q24H Gennaro Africa, MD   30 mg at 06/22/15 2206  . feeding supplement (BOOST HIGH PROTEIN) liquid 237 mL  1 Container Oral Q24H Gennaro Africa, MD   237 mL at 06/22/15 2207  . feeding supplement (ENSURE ENLIVE) (ENSURE ENLIVE) liquid 237 mL  237 mL Oral BID BM Gennaro Africa, MD   237 mL at 06/23/15 0919  . ferrous sulfate tablet 325 mg  325 mg Oral Q breakfast Gennaro Africa, MD   325 mg at 06/23/15 0804  . levothyroxine (SYNTHROID, LEVOTHROID) injection 60 mcg  60 mcg Intravenous Daily Belkys A Regalado, MD   60 mcg at 06/23/15 1253  . nitroGLYCERIN (NITROSTAT) SL tablet 0.4 mg  0.4 mg Sublingual Q5 min PRN Gennaro Africa, MD      . nystatin-triamcinolone ointment (MYCOLOG)   Topical BID Gennaro Africa, MD      . ondansetron [facility]) injection 4 mg  4 mg Intravenous Q6H PRN Gennaro Africa, MD      . pantoprazole (PROTONIX) EC tablet 40 mg  40 mg Oral Daily Gennaro Africa, MD   40 mg at 06/23/15 0919  . rOPINIRole (REQUIP) tablet 0.25 mg  0.25 mg Oral QHS Dianne Dun, NP   0.25 mg at 06/22/15 2207  . [START ON 06/25/2015] vancomycin (VANCOCIN) IVPB 750 mg/150 ml premix  750 mg Intravenous Q48H [place], [facility]      . vitamin B-12 (CYANOCOBALAMIN) tablet 1,000 mcg  1,000 mcg Oral Daily Gennaro Africa, MD   1,000 mcg at 06/23/15 [id number]     Discharge Medications: Please see discharge summary for a list of discharge medications.  Relevant Imaging Results:  Relevant Lab Results:   Additional Information SSN: [id number]  Benard Halsted, LCSWA

## 2015-06-23 NOTE — Progress Notes (Signed)
Initial Nutrition Assessment  DOCUMENTATION CODES:   Severe malnutrition in context of chronic illness  INTERVENTION:  Continue Boost po once daily, each supplement provides 240 kcal and 15 grams of protein.   Continue Ensure Enlive po BID, each supplement provides 350 kcal and 20 grams of protein.  Encourage adequate PO intake.   NUTRITION DIAGNOSIS:   Malnutrition related to chronic illness as evidenced by severe depletion of body fat, severe depletion of muscle mass.  GOAL:   Patient will meet greater than or equal to 90% of their needs  MONITOR:   PO intake, Supplement acceptance, Weight trends, Labs, I & O's  REASON FOR ASSESSMENT:   Malnutrition Screening Tool    ASSESSMENT:   82 yo female with history of CAD, PVD, Afib, SSS on PPM, TIA, COPD, hypothyroidism, CKD, hernia who came with cc of cough and fatigue for 3 weeks.  Pt reports having a lack of appetite which has been ongoing over the past 4-5 days. Meal completion has been 20%. Pt reports still consuming at least 3 meals a day PTA. Per Epic weight records, pt with a 4.5% weight loss in 4 months which is not found significant for time frame. Pt reports usually consuming Boost PTA at least once daily. Pt currently and Boost and Ensure ordered. RD to continue with current orders.   Nutrition-Focused physical exam completed. Findings are severe fat depletion, severe muscle depletion, and no edema.   Labs: Low calcium and GFR. High BUN, creatinine, ALT, AST.   Diet Order:  Diet heart healthy/carb modified Room service appropriate?: Yes; Fluid consistency:: Thin  Skin:  Reviewed, no issues  Last BM:  4/9  Height:   Ht Readings from Last 1 Encounters:  06/22/15 5\' 1"  (1.549 m)    Weight:   Wt Readings from Last 1 Encounters:  06/22/15 107 lb 9.6 oz (48.807 kg)    Ideal Body Weight:  47.7 kg  BMI:  Body mass index is 20.34 kg/(m^2).  Estimated Nutritional Needs:   Kcal:  1350-1550  Protein:   60-70 grams  Fluid:  >/= 1.5 L/day  EDUCATION NEEDS:   No education needs identified at this time  Corrin Parker, MS, RD, LDN Pager # [phone] After hours/ weekend pager # [phone]

## 2015-06-23 NOTE — Clinical Documentation Improvement (Signed)
Internal Medicine  Can the diagnosis of CKD be further specified? Thank you   CKD Stage I - GFR greater than or equal to 90  CKD Stage II - GFR 60-89  CKD Stage III - GFR 30-59  CKD Stage IV - GFR 15-29  CKD Stage V - GFR < 15  Other condition  Unable to clinically determine   Supporting Information:  PNA, HF, HTN  Please exercise your independent, professional judgment when responding. A specific answer is not anticipated or expected.   Thank You, [place] [phone]

## 2015-06-23 NOTE — Progress Notes (Signed)
[facility] PROGRESS NOTE  NESTORA INNAMORATO [id number] DOB: 30-Nov-1917 DOA: 06/22/2015 PCP: Lauree Chandler, NP  Assessment/Plan: Patient is a 82 yo female with history of CAD, PVD, Afib, SSS on PPM, TIA, COPD, hypothyroidism, CKD, hernia who came with cc of cough and fatigue for 3 weeks. She stays at [facility] and has been complaining of cough/fatigue for 3 weeks so UA was done 2 weeks ago and was suggestive of a UTI per daughters ("mild") so she was given Augmentin for 10 days which she likely did no keep in her stomach as she was also having nausea and vomiting. She had two CXRs last week which were unremarkable per daughters. Due to worsening symptoms they decided to bring her to the ER.   HCAP: Patient failed management as an outpatient Vanc/Zosyn started per pharmacy dosing.   gentle at 75 cc/hr and monitor I/O.  Afib: continue plavix and amio  FTT, weakness. In setting of acute illness.  PT evalaution.  Ensure.   Elevated liver enzymes: repeat in am. Follow trend  CKD : cr stable.   CHF: hold lasix for now  CAD: cont plavix.   Vulvar /perineal erythematous/inflmmatory lesion: looking eczematous. Neg for candidal infection. Will give topical triamcinolone and watch.  \ Code Status: DNR Family Communication: care discussed with patient.  Disposition Plan: Remain inpatient.    Consultants:  none  Procedures:  none  Antibiotics:  cefepime 4-09  Vancomycin 4-09  HPI/Subjective: Had a BM. She is still feeling tired. She has been coughing a lot, productive cough. She spit medications per nurse.   Objective: Filed Vitals:   06/23/15 0257 06/23/15 0600  BP: 91/43   Pulse: 79 78  Temp: 98.3 F (36.8 C)   Resp: 16     Intake/Output Summary (Last 24 hours) at 06/23/15 1030 Last data filed at 06/23/15 0745  Gross per 24 hour  Intake    450 ml  Output    450 ml  Net      0 ml   Filed Weights   06/22/15 1551 06/22/15 2000  Weight: 50.803 kg (112 lb)  48.807 kg (107 lb 9.6 oz)    Exam:   General:  Alert in no distress  Cardiovascular: S 1, S 2 RRR  Respiratory: no wheezing, mild ronchus  Abdomen: bs present, soft, nt  Musculoskeletal: no edema  Data Reviewed: Basic Metabolic Panel:  Recent Labs Lab 06/22/15 1609 06/23/15 0352  NA 138 144  K 4.3 4.6  CL 96* 105  CO2 30 32  GLUCOSE 103* 88  BUN 33* 27*  CREATININE 1.64* 1.50*  CALCIUM 8.8* 8.2*   Liver Function Tests:  Recent Labs Lab 06/22/15 1609 06/23/15 0352  AST 123* 98*  ALT 71* 60*  ALKPHOS 89 66  BILITOT 0.8 0.9  PROT 5.8* 4.8*  ALBUMIN 2.6* 2.1*   No results for input(s): LIPASE, AMYLASE in the last 168 hours. No results for input(s): AMMONIA in the last 168 hours. CBC:  Recent Labs Lab 06/22/15 1609 06/23/15 0352  WBC 8.7 6.2  NEUTROABS 6.6 4.3  HGB 12.3 10.7*  HCT 38.9 34.5*  MCV 96.8 96.6  PLT 197 178   Cardiac Enzymes: No results for input(s): CKTOTAL, CKMB, CKMBINDEX, TROPONINI in the last 168 hours. BNP (last 3 results)  Recent Labs  11/03/14 1807 01/28/15 0935 03/10/15 2225  BNP 377.4* 404.1* 182.3*    ProBNP (last 3 results) No results for input(s): PROBNP in the last 8760 hours.  CBG: No results  for input(s): GLUCAP in the last 168 hours.  Recent Results (from the past 240 hour(s))  MRSA PCR Screening     Status: None   Collection Time: 06/22/15 10:32 PM  Result Value Ref Range Status   MRSA by PCR NEGATIVE NEGATIVE Final    Comment:        The GeneXpert MRSA Assay (FDA approved for NASAL specimens only), is one component of a comprehensive MRSA colonization surveillance program. It is not intended to diagnose MRSA infection nor to guide or monitor treatment for MRSA infections.      Studies: Dg Chest Port 1 View  06/22/2015  CLINICAL DATA:  Cough and decreased appetite EXAM: PORTABLE CHEST 1 VIEW COMPARISON:  03/10/2015 FINDINGS: Cardiac shadow is again enlarged. Pacing device is again seen. The lungs  are well aerated bilaterally. Some early right basilar infiltrate is noted. No acute bony abnormality is seen. IMPRESSION: Early right basilar infiltrate. Electronically Signed   By: Inez Catalina M.D.   On: 06/22/2015 17:28    Scheduled Meds: . amiodarone  200 mg Oral Daily  . ceFEPime (MAXIPIME) IV  1 g Intravenous Q24H  . clopidogrel  75 mg Oral Daily  . enoxaparin (LOVENOX) injection  30 mg Subcutaneous Q24H  . feeding supplement  1 Container Oral Q24H  . feeding supplement (ENSURE ENLIVE)  237 mL Oral BID BM  . ferrous sulfate  325 mg Oral Q breakfast  . levothyroxine  125 mcg Oral QAC breakfast  . nystatin-triamcinolone ointment   Topical BID  . pantoprazole  40 mg Oral Daily  . potassium chloride SA  20 mEq Oral Daily  . rOPINIRole  0.25 mg Oral QHS  . vancomycin  1,000 mg Intravenous Once  . [START ON 06/25/2015] vancomycin  750 mg Intravenous Q48H  . vitamin B-12  1,000 mcg Oral Daily   Continuous Infusions: . sodium chloride 75 mL/hr at 06/23/15 0200    Active Problems:   HCAP (healthcare-associated pneumonia)   Pneumonia    Time spent: 25 minutes.     Niel Hummer A  [facility] Pager [phone]. If 7PM-7AM, please contact night-coverage at [email or web address], password [facility] 06/23/2015, 10:30 AM  LOS: 1 day

## 2015-06-24 DIAGNOSIS — R7401 Elevation of levels of liver transaminase levels: Secondary | ICD-10-CM | POA: Insufficient documentation

## 2015-06-24 DIAGNOSIS — R740 Nonspecific elevation of levels of transaminase and lactic acid dehydrogenase [LDH]: Secondary | ICD-10-CM

## 2015-06-24 LAB — COMPREHENSIVE METABOLIC PANEL
ALBUMIN: 2.2 g/dL — AB (ref 3.5–5.0)
ALT: 57 U/L — ABNORMAL HIGH (ref 14–54)
ANION GAP: 7 (ref 5–15)
AST: 88 U/L — ABNORMAL HIGH (ref 15–41)
Alkaline Phosphatase: 73 U/L (ref 38–126)
BUN: 18 mg/dL (ref 6–20)
CHLORIDE: 107 mmol/L (ref 101–111)
CO2: 29 mmol/L (ref 22–32)
Calcium: 8.5 mg/dL — ABNORMAL LOW (ref 8.9–10.3)
Creatinine, Ser: 1.14 mg/dL — ABNORMAL HIGH (ref 0.44–1.00)
GFR calc Af Amer: 46 mL/min — ABNORMAL LOW (ref >60)
GFR calc non Af Amer: 39 mL/min — ABNORMAL LOW (ref >60)
GLUCOSE: 80 mg/dL (ref 65–99)
POTASSIUM: 4.4 mmol/L (ref 3.5–5.1)
SODIUM: 143 mmol/L (ref 135–145)
Total Bilirubin: 0.7 mg/dL (ref 0.3–1.2)
Total Protein: 4.9 g/dL — ABNORMAL LOW (ref 6.5–8.1)

## 2015-06-24 LAB — CBC
HCT: 36.5 % (ref 36.0–46.0)
HEMOGLOBIN: 11.1 g/dL — AB (ref 12.0–15.0)
MCH: 29.8 pg (ref 26.0–34.0)
MCHC: 30.4 g/dL (ref 30.0–36.0)
MCV: 97.9 fL (ref 78.0–100.0)
Platelets: 185 10*3/uL (ref 150–400)
RBC: 3.73 MIL/uL — ABNORMAL LOW (ref 3.87–5.11)
RDW: 14.8 % (ref 11.5–15.5)
WBC: 5.8 10*3/uL (ref 4.0–10.5)

## 2015-06-24 MED ORDER — VANCOMYCIN HCL 500 MG IV SOLR
500.0000 mg | INTRAVENOUS | Status: DC
  Administered 2015-06-24 – 2015-06-25 (×2): 500 mg via INTRAVENOUS
  Filled 2015-06-24 (×2): qty 500

## 2015-06-24 MED ORDER — LEVOTHYROXINE SODIUM 25 MCG PO TABS
125.0000 ug | ORAL_TABLET | Freq: Every day | ORAL | Status: DC
  Administered 2015-06-25 – 2015-06-26 (×2): 125 ug via ORAL
  Filled 2015-06-24 (×2): qty 1

## 2015-06-24 MED ORDER — ALBUTEROL SULFATE (2.5 MG/3ML) 0.083% IN NEBU: 2.5000 mg | INHALATION_SOLUTION | RESPIRATORY_TRACT | Status: DC | PRN

## 2015-06-24 NOTE — Care Management Note (Signed)
Case Management Note  Patient Details  Name: [name] MRN: [id number] Date of Birth: 22-Aug-1917  Subjective/Objective:           Admitted with pneumonia         Action/Plan: Patient from [facility], [place] following, per patient and family plan is to return to [facility]. Will continue to follow for discharge needs.  Expected Discharge Date:                  Expected Discharge Plan:  Skilled Nursing Facility  In-House Referral:  Clinical Social Work  Discharge planning Services  CM Consult  Post Acute Care Choice:    Choice offered to:     DME Arranged:    DME Agency:     HH Arranged:    [place] Agency:     Status of Service:  In process, will continue to follow  Medicare Important Message Given:    Date Medicare IM Given:    Medicare IM give by:    Date Additional Medicare IM Given:    Additional Medicare Important Message give by:     If discussed at [place] of Stay Meetings, dates discussed:    Additional Comments:  Nila Nephew, RN 06/24/2015, 2:28 PM

## 2015-06-24 NOTE — Evaluation (Signed)
Physical Therapy Evaluation Patient Details Name: [name] MRN: [id number] DOB: 1917/04/04 Today's Date: 06/24/2015   History of Present Illness  82 y.o. female admitted to [facility] on 06/22/15 for fatigue, cough.  Dx with HCAP, FTT, and weakness.  Pt with significant PMHx of GIB, HTN, TIA, AAA, CAD, SSS, MI, A-fib, COPD, asthma, stroke, pacemaker, and bil THA.    Clinical Impression  Pt is generally weak and deconditioned, but despite this is overall min assist and was able to ambulate into the hallway with her RW.  She would benefit from therapy at [facility] after discharge.   PT to follow acutely for deficits listed below.       Follow Up Recommendations SNF    Equipment Recommendations  None recommended by PT    Recommendations for Other Services   NA    Precautions / Restrictions Precautions Precautions: Fall;Other (comment) Precaution Comments: monitor O2 sats and HR      Mobility  Bed Mobility               General bed mobility comments: Pt OOB in the recliner chair  Transfers Overall transfer level: Needs assistance Equipment used: Rolling walker (2 wheeled) Transfers: Sit to/from Stand Sit to Stand: Min assist         General transfer comment: Min assist to support trunk during transitions to power up to standing and to control descent to sitting.  Verbal cues for safe hand placement.   Ambulation/Gait Ambulation/Gait assistance: Min assist;+2 safety/equipment Ambulation Distance (Feet): 65 Feet Assistive device: Rolling walker (2 wheeled) Gait Pattern/deviations: Step-through pattern;Trunk flexed;Shuffle Gait velocity: decreased Gait velocity interpretation: <1.8 ft/sec, indicative of risk for recurrent falls General Gait Details: Pt with flexed trunk gait pattern over shaky/buckling knees at times. To encourage increased gait distance and safety, followed with recliner chair.  2 L O2 [place] used throughout session with O2 sats and HR stable throughout.            Balance Overall balance assessment: Needs assistance Sitting-balance support: Feet supported;Bilateral upper extremity supported Sitting balance-Leahy Scale: Poor Sitting balance - Comments: fatigues quickly in sitting with back unsupported, uses both hands to pull forward and maintain sitting at edge of recliner chair.    Standing balance support: Bilateral upper extremity supported Standing balance-Leahy Scale: Poor                               Pertinent Vitals/Pain Pain Assessment: No/denies pain    Home Living Family/patient expects to be discharged to:: Skilled nursing facility                      Prior Function Level of Independence: Independent with assistive device(s)         Comments: used rollator and until ~2 -3 weeks ago was completely independent with gait in her room with rollator.         Extremity/Trunk Assessment   Upper Extremity Assessment: Generalized weakness           Lower Extremity Assessment: Generalized weakness      Cervical / Trunk Assessment: Kyphotic  Communication   Communication: HOH (right ear is better)  Cognition Arousal/Alertness: Awake/alert Behavior During Therapy: WFL for tasks assessed/performed Overall Cognitive Status: Within Functional Limits for tasks assessed                         Exercises General  Exercises - Upper Extremity Shoulder Flexion: AROM;Both;10 reps;Seated General Exercises - Lower Extremity Long Arc Quad: AROM;Both;10 reps;Seated Hip ABduction/ADduction: AROM;Both;10 reps;Seated (adduction against pillow for resistance) Hip Flexion/Marching: AROM;Both;10 reps;Seated Toe Raises: AROM;Both;20 reps;Seated Heel Raises: AROM;Both;20 reps;Seated      Assessment/Plan    PT Assessment Patient needs continued PT services  PT Diagnosis Difficulty walking;Abnormality of gait;Generalized weakness   PT Problem List Decreased strength;Decreased activity  tolerance;Decreased balance;Decreased mobility;Decreased knowledge of use of DME;Cardiopulmonary status limiting activity  PT Treatment Interventions DME instruction;Gait training;Functional mobility training;Therapeutic activities;Therapeutic exercise;Balance training;Neuromuscular re-education;Patient/family education   PT Goals (Current goals can be found in the Care Plan section) Acute Rehab PT Goals Patient Stated Goal: to get back to her independence PT Goal Formulation: With patient Time For Goal Achievement: 07/08/15 Potential to Achieve Goals: Good    Frequency Min 2X/week           End of Session Equipment Utilized During Treatment: Gait belt;Oxygen Activity Tolerance: Patient limited by fatigue Patient left: in chair;with call bell/phone within reach;with family/visitor present           Time: 1445-1516 PT Time Calculation (min) (ACUTE ONLY): 31 min   Charges:   PT Evaluation $PT Eval Moderate Complexity: 1 Procedure PT Treatments $Gait Training: 8-22 mins        [name] B. [place], [place], DPT [phone]   06/24/2015, 3:29 PM

## 2015-06-24 NOTE — Consult Note (Signed)
SLP Cancellation Note  Unable to complete SLP evaluation/treatment secondary to pt currently unavailable.   Given multiple high risk indicators (hx esophageal dysmotility, GERD, COPD, advanced age), recommend Palliative Care consult to facilitate establishment of goals of care. Pt may benefit from Modified Barium Swallow to objectively assess swallow function and safety and to identify least restrictive diet, however, information from pt/family would be helpful in determining appropriate plan of treatment.  ST will continue efforts.  Nicholas Trompeter B. Quentin Ore [facility], [place] [phone]

## 2015-06-24 NOTE — Progress Notes (Signed)
ANTIBIOTIC CONSULT NOTE  Pharmacy Consult:  Vancomycin / Cefepime Indication:  HCAP  Allergies  Allergen Reactions  . Nubain [Nalbuphine Hcl] Anaphylaxis  . Mirtazapine Other (See Comments)    "Weakness in legs"  . Pineapple Other (See Comments)    Listed on [facility] 06/2015  . Statins Other (See Comments)    Leg weakness  . Iodine Rash    Patient Measurements: Height: 5\' 1"  (154.9 cm) Weight: 107 lb 9.6 oz (48.807 kg) IBW/kg (Calculated) : 47.8  Vital Signs: Temp: 98.1 F (36.7 C) (04/11 0616) BP: 112/60 mmHg (04/11 0616) Pulse Rate: 82 (04/11 0616)  Labs:  Recent Labs  06/22/15 1609 06/23/15 0352 06/24/15 0648  WBC 8.7 6.2 5.8  HGB 12.3 10.7* 11.1*  PLT 197 178 185  CREATININE 1.64* 1.50* 1.14*   Estimated Creatinine Clearance: 21.8 mL/min (by C-G formula based on Cr of 1.14). No results for input(s): VANCOTROUGH, VANCOPEAK, VANCORANDOM, GENTTROUGH, GENTPEAK, GENTRANDOM, TOBRATROUGH, TOBRAPEAK, TOBRARND, AMIKACINPEAK, AMIKACINTROU, AMIKACIN in the last 72 hours.   Microbiology: Recent Results (from the past 720 hour(s))  MRSA PCR Screening     Status: None   Collection Time: 06/22/15 10:32 PM  Result Value Ref Range Status   MRSA by PCR NEGATIVE NEGATIVE Final    Comment:        The GeneXpert MRSA Assay (FDA approved for NASAL specimens only), is one component of a comprehensive MRSA colonization surveillance program. It is not intended to diagnose MRSA infection nor to guide or monitor treatment for MRSA infections.     Assessment: 89 YOF from SNF via [facility] with complaint of decreased PO intake.  Pharmacy consulted to initiate vancomycin and cefepime for HCAP.  Renal function has improved.  There was a delay in vancomycin being started - SZP completed.  Vanc 4/10 >> Cefepime 4/9 >>  MRSA PCR neg  Goal of Therapy:  Vancomycin trough level 15-20 mcg/ml  Plan:  - Increase vancomycin to 500 mg IV q24h - Cefepime 1 g IV q24h - Monitor renal fxn,  clinical progress, vanc trough as indicated   [employer], Pharm.D., BCPS Clinical Pharmacist Pager: [phone] 06/24/2015 11:32 AM

## 2015-06-24 NOTE — Progress Notes (Signed)
[facility] PROGRESS NOTE  [name] [id number] DOB: 11/07/1917 DOA: 06/22/2015 PCP: Lauree Chandler, NP  Assessment/Plan: Patient is a 82 yo female with history of CAD, PVD, Afib, SSS on PPM, TIA, COPD, hypothyroidism, CKD, hernia who came with cc of cough and fatigue for 3 weeks. She stays at [facility] and has been complaining of cough/fatigue for 3 weeks so UA was done 2 weeks ago and was suggestive of a UTI per daughters ("mild") so she was given Augmentin for 10 days which she likely did no keep in her stomach as she was also having nausea and vomiting. She had two CXRs last week which were unremarkable per daughters. Due to worsening symptoms they decided to bring her to the ER.   HCAP: Patient failed management as an outpatient Vanc/Zosyn started per pharmacy dosing.  SLP eval as right sided PNA -nebs -pulm toilet  Afib: continue plavix and amio  FTT, weakness. In setting of acute illness.  PT evalaution.  Ensure.   Elevated liver enzymes:trending down  CKD : cr stable.   CHF: hold lasix for now  CAD: cont plavix.   Vulvar /perineal erythematous/inflmmatory lesion: looking eczematous. Neg for candidal infection. Will give topical triamcinolone and watch.   Code Status: DNR Family Communication: called daughter.  Disposition Plan: SNF 1-2 days   Consultants:  none  Procedures:  none  Antibiotics:  cefepime 4-09  Vancomycin 4-09  HPI/Subjective: Not feeling well per patient- cold   Objective: Filed Vitals:   06/23/15 2048 06/24/15 0616  BP: 113/65 112/60  Pulse: 84 82  Temp: 98 F (36.7 C) 98.1 F (36.7 C)  Resp: 18 18    Intake/Output Summary (Last 24 hours) at 06/24/15 1128 Last data filed at 06/23/15 1250  Gross per 24 hour  Intake    200 ml  Output      0 ml  Net    200 ml   Filed Weights   06/22/15 1551 06/22/15 2000  Weight: 50.803 kg (112 lb) 48.807 kg (107 lb 9.6 oz)    Exam:   General:  Alert in no  distress  Cardiovascular: S 1, S 2 RRR  Respiratory: occ. wheezing, mild ronchus  Abdomen: bs present, soft, nt  Musculoskeletal: no edema  Data Reviewed: Basic Metabolic Panel:  Recent Labs Lab 06/22/15 1609 06/23/15 0352 06/24/15 0648  NA 138 144 143  K 4.3 4.6 4.4  CL 96* 105 107  CO2 30 32 29  GLUCOSE 103* 88 80  BUN 33* 27* 18  CREATININE 1.64* 1.50* 1.14*  CALCIUM 8.8* 8.2* 8.5*   Liver Function Tests:  Recent Labs Lab 06/22/15 1609 06/23/15 0352 06/24/15 0648  AST 123* 98* 88*  ALT 71* 60* 57*  ALKPHOS 89 66 73  BILITOT 0.8 0.9 0.7  PROT 5.8* 4.8* 4.9*  ALBUMIN 2.6* 2.1* 2.2*   No results for input(s): LIPASE, AMYLASE in the last 168 hours. No results for input(s): AMMONIA in the last 168 hours. CBC:  Recent Labs Lab 06/22/15 1609 06/23/15 0352 06/24/15 0648  WBC 8.7 6.2 5.8  NEUTROABS 6.6 4.3  --   HGB 12.3 10.7* 11.1*  HCT 38.9 34.5* 36.5  MCV 96.8 96.6 97.9  PLT 197 178 185   Cardiac Enzymes: No results for input(s): CKTOTAL, CKMB, CKMBINDEX, TROPONINI in the last 168 hours. BNP (last 3 results)  Recent Labs  11/03/14 1807 01/28/15 0935 03/10/15 2225  BNP 377.4* 404.1* 182.3*    ProBNP (last 3 results) No results for  input(s): PROBNP in the last 8760 hours.  CBG: No results for input(s): GLUCAP in the last 168 hours.  Recent Results (from the past 240 hour(s))  MRSA PCR Screening     Status: None   Collection Time: 06/22/15 10:32 PM  Result Value Ref Range Status   MRSA by PCR NEGATIVE NEGATIVE Final    Comment:        The GeneXpert MRSA Assay (FDA approved for NASAL specimens only), is one component of a comprehensive MRSA colonization surveillance program. It is not intended to diagnose MRSA infection nor to guide or monitor treatment for MRSA infections.      Studies: Dg Chest Port 1 View  06/22/2015  CLINICAL DATA:  Cough and decreased appetite EXAM: PORTABLE CHEST 1 VIEW COMPARISON:  03/10/2015 FINDINGS:  Cardiac shadow is again enlarged. Pacing device is again seen. The lungs are well aerated bilaterally. Some early right basilar infiltrate is noted. No acute bony abnormality is seen. IMPRESSION: Early right basilar infiltrate. Electronically Signed   By: Inez Catalina M.D.   On: 06/22/2015 17:28    Scheduled Meds: . amiodarone  200 mg Oral Daily  . ceFEPime (MAXIPIME) IV  1 g Intravenous Q24H  . clopidogrel  75 mg Oral Daily  . enoxaparin (LOVENOX) injection  30 mg Subcutaneous Q24H  . feeding supplement  1 Container Oral Q24H  . feeding supplement (ENSURE ENLIVE)  237 mL Oral BID BM  . ferrous sulfate  325 mg Oral Q breakfast  . [START ON 06/25/2015] levothyroxine  125 mcg Oral QAC breakfast  . nystatin-triamcinolone ointment   Topical BID  . pantoprazole  40 mg Oral Daily  . rOPINIRole  0.25 mg Oral QHS  . [START ON 06/25/2015] vancomycin  750 mg Intravenous Q48H  . vitamin B-12  1,000 mcg Oral Daily   Continuous Infusions:    Active Problems:   HCAP (healthcare-associated pneumonia)   Pneumonia    Time spent: 25 minutes.     [place] [facility] Pager [phone]. If 7PM-7AM, please contact night-coverage at [email or web address], password [facility] 06/24/2015, 11:28 AM  LOS: 2 days

## 2015-06-25 ENCOUNTER — Inpatient Hospital Stay (HOSPITAL_COMMUNITY): Payer: Medicare Other

## 2015-06-25 DIAGNOSIS — I5032 Chronic diastolic (congestive) heart failure: Secondary | ICD-10-CM

## 2015-06-25 DIAGNOSIS — J189 Pneumonia, unspecified organism: Secondary | ICD-10-CM

## 2015-06-25 DIAGNOSIS — Z950 Presence of cardiac pacemaker: Secondary | ICD-10-CM

## 2015-06-25 DIAGNOSIS — I2510 Atherosclerotic heart disease of native coronary artery without angina pectoris: Secondary | ICD-10-CM

## 2015-06-25 DIAGNOSIS — E038 Other specified hypothyroidism: Secondary | ICD-10-CM

## 2015-06-25 DIAGNOSIS — H9193 Unspecified hearing loss, bilateral: Secondary | ICD-10-CM

## 2015-06-25 MED ORDER — LEVOFLOXACIN 500 MG PO TABS
750.0000 mg | ORAL_TABLET | Freq: Once | ORAL | Status: AC
  Administered 2015-06-25: 750 mg via ORAL
  Filled 2015-06-25: qty 2

## 2015-06-25 MED ORDER — LEVOFLOXACIN 500 MG PO TABS: 500.0000 mg | ORAL_TABLET | ORAL | Status: DC

## 2015-06-25 NOTE — Progress Notes (Signed)
MBSS complete. Full report located under chart review in imaging section.   Meagon Duskin, M.A., CCC-SLP [phone] 

## 2015-06-25 NOTE — Evaluation (Signed)
Clinical/Bedside Swallow Evaluation Patient Details  Name: [name] MRN: [id number] Date of Birth: [birth year]  Today's Date: 06/25/2015 Time: SLP Start Time (ACUTE ONLY): 4 SLP Stop Time (ACUTE ONLY): 0930 SLP Time Calculation (min) (ACUTE ONLY): 18 min  Past Medical History:  Past Medical History  Diagnosis Date  . GI bleed 2007  . Hypertension   . TIA (transient ischemic attack)   . Hypothyroidism   . Abdominal aortic aneurysm ([place])   . CAD (coronary artery disease)   . SSS (sick sinus syndrome) ([place]) 10/05/2007    Medtronic Adapta  . Myocardial infarction ([place])   . Atrial fibrillation ([place])   . COPD (chronic obstructive pulmonary disease) ([place])   . Asthma     hx  . Anemia     hx  . Blood transfusion   . UTI (lower urinary tract infection)     hx  . Renal failure, chronic   . H/O hiatal hernia   . GERD (gastroesophageal reflux disease)   . Stroke ([place])   . Osteoporosis   . Blood transfusion without reported diagnosis   . Pacemaker    Past Surgical History:  Past Surgical History  Procedure Laterality Date  . Pacemaker insertion  10/05/2007    Medtronic Adapta  . Coronary stent placement      x9  . Cardiac catheterization    . Fracture surgery      femur fx, /w ORIF into HIP- 2008  . Total hip arthroplasty      planned for 08/27/2011 Left  . Total hip arthroplasty  08/27/2011    Procedure: TOTAL HIP ARTHROPLASTY;  Surgeon: Kerin Salen, MD;  Location: [place];  Service: Orthopedics;  Laterality: Right;  . US echocardiography  08/23/2011    EF 50%,LA mod. dilated,mild to mod. mitral annular ca+,mod. MR,mild to mod TR,mild to mod. PH,trace AI.  [name] Nm myocar perf wall motion  10/08/2010    mild apical anterior ischemia  . Colonoscopy     HPI:  82 year old female admitted 06/22/15 with fatigue and cough for 3 weeks. PMH significant for COPD. Barium Swallow (2012) revealed moderate esophageal dysmotiity and mild GERD. CXR reveals RLL infiltrate, raising suspicion  for aspiration. COPd increases risk of silent aspiration and esophageal issues raise suspicion for likelihood of poor esophageal clearing and/or reflux aspiration.   Assessment / Plan / Recommendation Clinical Impression  Bedside swallow evaluation complete.  Oral motor exam remarkable for generalized weakness.  Pharyngeal phase is marked by suspected delay in swallow with overt s/s of aspiration across all tested consistencies.  Cough appeared congested and weak, likely ineffective at clearing potential penetrates/aspirates.  An objective swallow assessment is warranted to determine safest PO intake at this time.   Recommend NPO until after MBS today at 10.     Aspiration Risk  Severe aspiration risk    Diet Recommendation NPO;Alternative means - temporary until after MBS is completed  Medication Administration: Via alternative means         Follow up Recommendations   TBD                       Swallow Study   General HPI: 82 year old female admitted 06/22/15 with fatigue and cough for 3 weeks. PMH significant for COPD. Barium Swallow (2012) revealed moderate esophageal dysmotiity and mild GERD. CXR reveals RLL infiltrate, raising suspicion for aspiration. COPd increases risk of silent aspiration and esophageal issues raise suspicion for likelihood of  poor esophageal clearing and/or reflux aspiration. Type of Study: Bedside Swallow Evaluation Previous Swallow Assessment: none on record Diet Prior to this Study: Dysphagia 3 (soft);Thin liquids Temperature Spikes Noted: No Respiratory Status: Nasal cannula History of Recent Intubation: No Behavior/Cognition: Alert;Cooperative;Pleasant mood Oral Cavity Assessment: Within Functional Limits Oral Care Completed by SLP: No Oral Cavity - Dentition: Dentures, top;Dentures, bottom Vision: Functional for self-feeding Self-Feeding Abilities: Needs assist;Needs set up;Able to feed self Patient Positioning: Upright in bed Baseline Vocal  Quality: Breathy;Low vocal intensity Volitional Cough: Weak;Congested Volitional Swallow: Able to elicit    Oral/Motor/Sensory Function Overall Oral Motor/Sensory Function: Mild impairment (general weakness)   Ice Chips Ice chips: Within functional limits Pharyngeal Phase Impairments: Multiple swallows   Thin Liquid Thin Liquid: Impaired Presentation: Cup;Straw Pharyngeal  Phase Impairments: Suspected delayed Swallow;Multiple swallows;Cough - Immediate    Nectar Thick Nectar Thick Liquid: Not tested   Honey Thick Honey Thick Liquid: Not tested   Puree Puree: Impaired Presentation: Spoon Pharyngeal Phase Impairments: Suspected delayed Swallow;Multiple swallows;Cough - Immediate   Solid   GO   Solid: Not tested       [name], M.A., CCC-SLP [phone]  [name] 06/25/2015,10:10 AM

## 2015-06-25 NOTE — Progress Notes (Signed)
[facility] Progress Note  Patient: [name] [id number]   PCP: Lauree Chandler, NP DOB: [birth year]   DOA: 06/22/2015   DOS: 06/25/2015   Date of Service: the patient was seen and examined on 06/25/2015  Subjective: The patient complains of significant fatigue and tiredness. Tells me "I am sick" Nutrition: Tolerating oral diet  Brief hospital course: Patient was admitted on 06/22/2015, with complaint of cough and shortness of breath along with fatigue, was found to have recurrent pneumonia suspected healthcare associated with aspiration. Currently further plan is to transition antibiotic to oral and follow speech therapy recommendation.  Assessment and Plan: 1. HCAP (healthcare-associated pneumonia) Speech therapy recommends dysphagia type II diet for the patient. Vancomycin and Zosyn were on admission currently the patient will be transferred to [place]. Continue nebulizers and pulmonary toilet as needed.  2. Goals of care discussion. Family wartlike to discuss with palliative care here in the hospital. consult them for a follow-up.  3. Chronic Atrial fibrillation. Continue amiodarone and Plavix. Not on any anticoagulation due to high risk of bleeding.  4. Suspected protein calorie malnutrition. Continue nutritional supplementation.  5. GERD. Continue PPI.  6. Hypothyroidism. Continue Synthroid.  Activity: physical therapy recommends SNF Bowel regimen: last BM 06/24/2015 DVT Prophylaxis: subcutaneous Heparin Nutrition: Dysphagia type diet Advance goals of care discussion: DNR/DNI  Procedures: none Consultants: Palliative medicine Antibiotics: Anti-infectives    Start     Dose/Rate Route Frequency Ordered Stop   06/27/15 1800  levofloxacin (LEVAQUIN) tablet 500 mg     500 mg Oral Every 48 hours 06/25/15 1447     06/25/15 1800  levofloxacin (LEVAQUIN) tablet 750 mg     750 mg Oral  Once 06/25/15 1447 06/25/15 1714   06/25/15 1100  vancomycin (VANCOCIN)  IVPB 750 mg/150 ml premix  Status:  Discontinued     750 mg 150 mL/hr over 60 Minutes Intravenous Every 48 hours 06/23/15 1025 06/24/15 1131   06/24/15 2000  vancomycin (VANCOCIN) IVPB 750 mg/150 ml premix  Status:  Discontinued     750 mg 150 mL/hr over 60 Minutes Intravenous Every 48 hours 06/22/15 1832 06/23/15 1025   06/24/15 1200  vancomycin (VANCOCIN) 500 mg in sodium chloride 0.9 % 100 mL IVPB  Status:  Discontinued     500 mg 100 mL/hr over 60 Minutes Intravenous Every 24 hours 06/24/15 1131 06/25/15 1436   06/23/15 1800  ceFEPIme (MAXIPIME) 1 g in dextrose 5 % 50 mL IVPB  Status:  Discontinued     1 g 100 mL/hr over 30 Minutes Intravenous Every 24 hours 06/22/15 1832 06/25/15 1436   06/22/15 1845  vancomycin (VANCOCIN) IVPB 1000 mg/200 mL premix     1,000 mg 200 mL/hr over 60 Minutes Intravenous  Once 06/22/15 1832 06/23/15 1200   06/22/15 1845  ceFEPIme (MAXIPIME) 1 g in dextrose 5 % 50 mL IVPB     1 g 100 mL/hr over 30 Minutes Intravenous  Once 06/22/15 1832 06/22/15 1932       Family Communication: family was present at bedside, at the time of interview. The pt provided permission to discuss medical plan with the family. Opportunity was given to ask question and all questions were answered satisfactorily.   Disposition:  Expected discharge date:06/26/2015 Barriers to safe discharge: Stabilization on oral antibiotics   Intake/Output Summary (Last 24 hours) at 06/25/15 1927 Last data filed at 06/25/15 1843  Gross per 24 hour  Intake    680 ml  Output  0 ml  Net    680 ml   Filed Weights   06/22/15 1551 06/22/15 2000  Weight: 50.803 kg (112 lb) 48.807 kg (107 lb 9.6 oz)    Objective: Physical Exam: Filed Vitals:   06/24/15 1620 06/24/15 1958 06/25/15 0500 06/25/15 1300  BP: 110/52 111/50 114/54 113/56  Pulse: 73 73 65 73  Temp:  98.2 F (36.8 C) 97.7 F (36.5 C) 97.9 F (36.6 C)  TempSrc:  Oral Oral Oral  Resp:  18 18 18   Height:      Weight:        SpO2: 98% 97% 98% 94%     General: Appear in mild distress, no Rash; Oral Mucosa moist. Cardiovascular: S1 and S2 Present, no Murmur, no JVD Respiratory: Bilateral Air entry present and faint basal Crackles, no wheezes Abdomen: Bowel Sound present, Soft and no tenderness Extremities: no Pedal edema, no calf tenderness Neurology: Grossly no focal neuro deficit.  Data Reviewed: CBC:  Recent Labs Lab 06/22/15 1609 06/23/15 0352 06/24/15 0648  WBC 8.7 6.2 5.8  NEUTROABS 6.6 4.3  --   HGB 12.3 10.7* 11.1*  HCT 38.9 34.5* 36.5  MCV 96.8 96.6 97.9  PLT 197 178 [id number]   Basic Metabolic Panel:  Recent Labs Lab 06/22/15 1609 06/23/15 0352 06/24/15 0648  NA 138 144 143  K 4.3 4.6 4.4  CL 96* 105 107  CO2 30 32 29  GLUCOSE 103* 88 80  BUN 33* 27* 18  CREATININE 1.64* 1.50* 1.14*  CALCIUM 8.8* 8.2* 8.5*   Liver Function Tests:  Recent Labs Lab 06/22/15 1609 06/23/15 0352 06/24/15 0648  AST 123* 98* 88*  ALT 71* 60* 57*  ALKPHOS 89 66 73  BILITOT 0.8 0.9 0.7  PROT 5.8* 4.8* 4.9*  ALBUMIN 2.6* 2.1* 2.2*   No results for input(s): LIPASE, AMYLASE in the last 168 hours. No results for input(s): AMMONIA in the last 168 hours.  Cardiac Enzymes: No results for input(s): CKTOTAL, CKMB, CKMBINDEX, TROPONINI in the last 168 hours.  BNP (last 3 results)  Recent Labs  11/03/14 1807 01/28/15 0935 03/10/15 2225  BNP 377.4* 404.1* 182.3*    CBG: No results for input(s): GLUCAP in the last 168 hours.  Recent Results (from the past 240 hour(s))  MRSA PCR Screening     Status: None   Collection Time: 06/22/15 10:32 PM  Result Value Ref Range Status   MRSA by PCR NEGATIVE NEGATIVE Final    Comment:        The GeneXpert MRSA Assay (FDA approved for NASAL specimens only), is one component of a comprehensive MRSA colonization surveillance program. It is not intended to diagnose MRSA infection nor to guide or monitor treatment for MRSA infections.       Studies: Dg Swallowing Func-speech Pathology  06/25/2015  Objective Swallowing Evaluation: Type of Study: MBS-Modified Barium Swallow Study Patient Details Name: [name] MRN: [id number] Date of Birth: 07-25-1917 Today's Date: 06/25/2015 Time: SLP Start Time (ACUTE ONLY): 1032-SLP Stop Time (ACUTE ONLY): 1042 SLP Time Calculation (min) (ACUTE ONLY): 10 min Past Medical History: Past Medical History Diagnosis Date . GI bleed 2007 . Hypertension  . TIA (transient ischemic attack)  . Hypothyroidism  . Abdominal aortic aneurysm ([place])  . CAD (coronary artery disease)  . SSS (sick sinus syndrome) ([place]) 10/05/2007   Medtronic Adapta . Myocardial infarction ([place])  . Atrial fibrillation ([place])  . COPD (chronic obstructive pulmonary disease) ([place])  . Asthma    hx .  Anemia    hx . Blood transfusion  . UTI (lower urinary tract infection)    hx . Renal failure, chronic  . H/O hiatal hernia  . GERD (gastroesophageal reflux disease)  . Stroke ([place])  . Osteoporosis  . Blood transfusion without reported diagnosis  . Pacemaker  Past Surgical History: Past Surgical History Procedure Laterality Date . Pacemaker insertion  10/05/2007   Medtronic Adapta . Coronary stent placement     x9 . Cardiac catheterization   . Fracture surgery     femur fx, /w ORIF into HIP- 2008 . Total hip arthroplasty     planned for 08/27/2011 Left . Total hip arthroplasty  08/27/2011   Procedure: TOTAL HIP ARTHROPLASTY;  Surgeon: Kerin Salen, MD;  Location: [place];  Service: Orthopedics;  Laterality: Right; . US echocardiography  08/23/2011   EF 50%,LA mod. dilated,mild to mod. mitral annular ca+,mod. MR,mild to mod TR,mild to mod. PH,trace AI. [name] Nm myocar perf wall motion  10/08/2010   mild apical anterior ischemia . Colonoscopy   HPI: 82 year old female admitted 06/22/15 with fatigue and cough for 3 weeks. PMH significant for COPD. Barium Swallow (2012) revealed moderate esophageal dysmotiity and mild GERD. CXR reveals RLL infiltrate, raising suspicion for  aspiration. COPd increases risk of silent aspiration and esophageal issues raise suspicion for likelihood of poor esophageal clearing and/or reflux aspiration. Subjective: patient laying in bed reporting that she just doesn't feel well Assessment / Plan / Recommendation CHL IP CLINICAL IMPRESSIONS 06/25/2015 Therapy Diagnosis Moderate cervical esophageal phase dysphagia;Mild pharyngeal phase dysphagia;Mild oral phase dysphagia Clinical Impression --Objective swallow assessment complete.  Patient with mild oropharyngeal motor based dysphagia marked by mild, general weakness resulting in mild residue from base of tongue to the valleculea.  Swallow is also marked by a delay in swallow initiation with penetration occurring x1 with thin liquid residue that was cleared with a spontaneous second swallow.  This is compounded by esophageal issues that are characterized as backflow through the USE which is observed to enter the pharynx.  Patient with sensation of this and utilizes multiple swallows to manage/compensate.  Patient coughed throughout evaluation despite protecting her airway, which is likely related to sensed stasis in her esophagus (viewed during scan, no MD present to confirm). Despite today's observations of adequate airway protection patient remains at an increased aspiration risk and no modification to liquids or foods eliminates this risk.  Recommend a palliative care consult.  RN and patient educated on rationale for and importance for use of safe swallow compensatory strategies to minimize aspiration risk.  Patient verbalized understanding; however, recommend brief acute SLP follow up for education and carryover.     Impact on safety and function Moderate aspiration risk   CHL IP TREATMENT RECOMMENDATION 06/25/2015 Treatment Recommendations Therapy as outlined in treatment plan below   Prognosis 06/25/2015 Prognosis for Safe Diet Advancement Guarded Barriers to Reach Goals Severity of deficits;Time post  onset Barriers/Prognosis Comment history of esophageal issues that patient reports nothing can be done for CHL IP DIET RECOMMENDATION 06/25/2015 SLP Diet Recommendations Dysphagia 3 (Mech soft) solids;Thin liquid Liquid Administration via Cup;No straw Medication Administration Whole meds with puree Compensations Slow rate;Small sips/bites;Multiple dry swallows after each bite/sip;Follow solids with liquid Postural Changes Seated upright at 90 degrees;Remain semi-upright after after feeds/meals (Comment)   CHL IP OTHER RECOMMENDATIONS 06/25/2015 Recommended Consults -- Oral Care Recommendations Oral care BID Other Recommendations --   CHL IP FOLLOW UP RECOMMENDATIONS 06/25/2015 Follow up Recommendations 24 hour supervision/assistance  CHL IP FREQUENCY AND DURATION 06/25/2015 Speech Therapy Frequency (ACUTE ONLY) min 2x/week Treatment Duration 1 week      CHL IP ORAL PHASE 06/25/2015 Oral Phase WFL Oral - Pudding Teaspoon -- Oral - Pudding Cup -- Oral - Honey Teaspoon -- Oral - Honey Cup -- Oral - Nectar Teaspoon -- Oral - Nectar Cup -- Oral - Nectar Straw -- Oral - Thin Teaspoon -- Oral - Thin Cup -- Oral - Thin Straw -- Oral - Puree -- Oral - Mech Soft -- Oral - Regular -- Oral - Multi-Consistency -- Oral - Pill -- Oral Phase - Comment --  CHL IP PHARYNGEAL PHASE 06/25/2015 Pharyngeal Phase Impaired Pharyngeal- Pudding Teaspoon -- Pharyngeal -- Pharyngeal- Pudding Cup -- Pharyngeal -- Pharyngeal- Honey Teaspoon -- Pharyngeal -- Pharyngeal- Honey Cup -- Pharyngeal -- Pharyngeal- Nectar Teaspoon -- Pharyngeal -- Pharyngeal- Nectar Cup -- Pharyngeal -- Pharyngeal- Nectar Straw -- Pharyngeal -- Pharyngeal- Thin Teaspoon -- Pharyngeal -- Pharyngeal- Thin Cup Delayed swallow initiation-vallecula;Penetration/Apiration after swallow Pharyngeal Material enters airway, CONTACTS cords and then ejected out Pharyngeal- Thin Straw -- Pharyngeal -- Pharyngeal- Puree Delayed swallow initiation-vallecula;Other (Comment);Compensatory  strategies attempted (with notebox) Pharyngeal Material does not enter airway Pharyngeal- Mechanical Soft Delayed swallow initiation-vallecula Pharyngeal Material does not enter airway Pharyngeal- Regular -- Pharyngeal -- Pharyngeal- Multi-consistency -- Pharyngeal -- Pharyngeal- Pill -- Pharyngeal -- Pharyngeal Comment --  CHL IP CERVICAL ESOPHAGEAL PHASE 06/25/2015 Cervical Esophageal Phase Impaired Pudding Teaspoon -- Pudding Cup -- Honey Teaspoon -- Honey Cup -- Nectar Teaspoon -- Nectar Cup -- Nectar Straw -- Thin Teaspoon -- Thin Cup -- Thin Straw -- Puree -- Mechanical Soft -- Regular -- Multi-consistency -- Pill -- Cervical Esophageal Comment -- No flowsheet data found. Carmelia Roller., CCC-SLP [phone] [place] 06/25/2015, 2:01 PM                Scheduled Meds: . amiodarone  200 mg Oral Daily  . clopidogrel  75 mg Oral Daily  . enoxaparin (LOVENOX) injection  30 mg Subcutaneous Q24H  . feeding supplement  1 Container Oral Q24H  . feeding supplement (ENSURE ENLIVE)  237 mL Oral BID BM  . ferrous sulfate  325 mg Oral Q breakfast  . [START ON 06/27/2015] levofloxacin  500 mg Oral Q48H  . levothyroxine  125 mcg Oral QAC breakfast  . nystatin-triamcinolone ointment   Topical BID  . pantoprazole  40 mg Oral Daily  . rOPINIRole  0.25 mg Oral QHS  . vitamin B-12  1,000 mcg Oral Daily   Continuous Infusions:  PRN Meds: acetaminophen, albuterol, nitroGLYCERIN, ondansetron (ZOFRAN) IV  Time spent: 30 minutes  Author: Berle Mull, MD [facility] Pager: [phone] 06/25/2015 7:27 PM  If 7PM-7AM, please contact night-coverage at [email or web address], password [email or web address]

## 2015-06-25 NOTE — Progress Notes (Signed)
Pharmacy Antibiotic Note  [name] is a 82 y.o. female admitted on 06/22/2015 with decreased PO intake.  Pharmacy has been consulted for vancomycin and cefepime dosing for HCAP, now to narrow to Levaquin.  Patient's renal function is improving.  Plan: - Levaquin 750mg  PO x 1, then 500mg  PO Q48H - Pharmacy will sign off as dosage adjustment is likely unnecessary.  Thank you for the consult!   Height: 5\' 1"  (154.9 cm) Weight: 107 lb 9.6 oz (48.807 kg) IBW/kg (Calculated) : 47.8  Temp (24hrs), Avg:97.9 F (36.6 C), Min:97.7 F (36.5 C), Max:98.2 F (36.8 C)   Recent Labs Lab 06/22/15 1609 06/23/15 0352 06/24/15 0648  WBC 8.7 6.2 5.8  CREATININE 1.64* 1.50* 1.14*    Estimated Creatinine Clearance: 21.8 mL/min (by C-G formula based on Cr of 1.14).    Allergies  Allergen Reactions  . Nubain [Nalbuphine Hcl] Anaphylaxis  . Mirtazapine Other (See Comments)    "Weakness in legs"  . Pineapple Other (See Comments)    Listed on [facility] 06/2015  . Statins Other (See Comments)    Leg weakness  . Iodine Rash    Antimicrobials this admission: Vanc 4/10 >> 4/12 Cefepime 4/9 >> 4/12 LVQ 4/12 >>  Dose adjustments this admission: N/A  Microbiology results: MRSA PCR - negative    [name], PharmD, BCPS Pager:  [phone] 06/25/2015, 2:45 PM

## 2015-06-25 NOTE — Care Management Important Message (Signed)
Important Message  Patient Details  Name: [name] MRN: [id number] Date of Birth: 1917/07/15   Medicare Important Message Given:  Yes    [name] P [place] 06/25/2015, 1:18 PM

## 2015-06-25 NOTE — Clinical Social Work Note (Signed)
CSW met with patient's family who were at bedside to discuss possibly going to a different SNF.  CSW explained to patient's family that since patient has Medicare and Medicaid it will be very limited on what other SNF she can go to for long term care, because not many facilities have Medicaid beds available.  Patient's family asked about in home care and CSW explained to them that patient would not be able to have nurses 24 hours a day because they would have to pay privately for them.  CSW gave patient's family information [facility]  CSW also gave patient's family information about other SNFs that are in the area.  After discussing with the family options for long term care placement, they are in agreement to have patient return to facility.  CSW encouraged patient's family to talk with the administrator and social worker at the facility, [place] also gave patient's daughter contact information on the [facility].  CSW to continue to follow patient's progress throughout discharge planning.  Jones Broom. [place], MSW, [place] 06/25/2015 5:22 PM

## 2015-06-26 ENCOUNTER — Non-Acute Institutional Stay (SKILLED_NURSING_FACILITY): Payer: Medicare Other | Admitting: Internal Medicine

## 2015-06-26 DIAGNOSIS — Z515 Encounter for palliative care: Secondary | ICD-10-CM

## 2015-06-26 DIAGNOSIS — J181 Lobar pneumonia, unspecified organism: Principal | ICD-10-CM

## 2015-06-26 DIAGNOSIS — R531 Weakness: Secondary | ICD-10-CM | POA: Diagnosis not present

## 2015-06-26 DIAGNOSIS — N183 Chronic kidney disease, stage 3 unspecified: Secondary | ICD-10-CM

## 2015-06-26 DIAGNOSIS — Z66 Do not resuscitate: Secondary | ICD-10-CM

## 2015-06-26 DIAGNOSIS — J189 Pneumonia, unspecified organism: Secondary | ICD-10-CM | POA: Diagnosis not present

## 2015-06-26 DIAGNOSIS — I480 Paroxysmal atrial fibrillation: Secondary | ICD-10-CM | POA: Diagnosis not present

## 2015-06-26 LAB — RENAL FUNCTION PANEL
ALBUMIN: 2.1 g/dL — AB (ref 3.5–5.0)
Anion gap: 8 (ref 5–15)
BUN: 12 mg/dL (ref 6–20)
CALCIUM: 8.5 mg/dL — AB (ref 8.9–10.3)
CHLORIDE: 106 mmol/L (ref 101–111)
CO2: 27 mmol/L (ref 22–32)
CREATININE: 1.17 mg/dL — AB (ref 0.44–1.00)
GFR, EST AFRICAN AMERICAN: 44 mL/min — AB (ref >60)
GFR, EST NON AFRICAN AMERICAN: 38 mL/min — AB (ref >60)
GLUCOSE: 107 mg/dL — AB (ref 65–99)
PHOSPHORUS: 2.1 mg/dL — AB (ref 2.5–4.6)
Potassium: 4.4 mmol/L (ref 3.5–5.1)
Sodium: 141 mmol/L (ref 135–145)

## 2015-06-26 MED ORDER — LEVOFLOXACIN 500 MG PO TABS: 500.0000 mg | ORAL_TABLET | ORAL | Status: DC

## 2015-06-26 MED ORDER — BOOST HIGH PROTEIN PO LIQD: 1.0000 | ORAL | Status: DC

## 2015-06-26 MED ORDER — ENSURE ENLIVE PO LIQD: 237.0000 mL | Freq: Two times a day (BID) | ORAL | Status: DC

## 2015-06-26 NOTE — Clinical Social Work Note (Addendum)
Patient to be d/c'ed today to [facility].  Patient and family agreeable to plans will transport via ems RN to call report to [phone].  Evette Cristal, MSW, [place]

## 2015-06-26 NOTE — Progress Notes (Signed)
Patient ID: [name], female   DOB: [birth year], 82 y.o.   MRN: [id number]    MRN: [id number] Name: [name]  Sex: female Age: 82 y.o. DOB: 1917/07/17  [place] #: [facility] Facility/Room:101 Level Of Care: SNF Provider: Wille Celeste Emergency Contacts: Extended Emergency Contact Information Primary Emergency Contact: [name] Address: [address]          [place], [place] [address] [name] of [place] Phone: [phone] Mobile Phone: [phone] Relation: Daughter Secondary Emergency Contact: [name] Address: [place] [address], [place] [address] [name] of [place] Phone: [phone] Work Phone: [phone] Mobile Phone: [phone] Relation: Daughter  Code Status:   Allergies: Nubain; Mirtazapine; Pineapple; Statins; and Iodine  Chief Complaint  Patient presents with  . Acute Visit  visit status post hospitalization with diagnosis of healthcare associated pneumonia.     HPI: Patient is 82 y.o. female with AAA, hypertension, hypothyroidism and history of respiratory failure-at one point had been on oxygen but had been weaned off.  She did have a history of coughing congestion there is been treated with expectorants nebulizers she also had recent completed antibiotic for UTI.-- She did have chest x-rays done  in the facility that did not show any acute process apparently  Apparently the cough and congestion worsened over the weekend and she was sent to the ER for evaluation  She was diagnosed with right basilar infiltrate suspected aspiration-she was started on vancomycin and Zosyn.  She underwent modified barium swallow per speech therapy-diuretic and H and was dysphagia type III diet.  There also was goals of care discussion with the family-I and will write an order for a palliative care consult  She does have a history of chronic atrial fibrillation as well as chronic diastolic CHF-continues on a amiodarone  and Plavix not on anticoagulation because of a risk of bleeding.  She also low dose of Lasix her renal function will have to be monitored.  Patient is thought to be at high risk for developing recurrent aspiration and family is aware of this and again considering palliative or hospice care.  Currently patient appears to be stable but weaker than I have seen her in the past-he does not complain of any acute shortness of breath she is sitting in the bed comfortably but again appears to be quite frail          .  Past Medical History  Diagnosis Date  . GI bleed 2007  . Hypertension   . TIA (transient ischemic attack)   . Hypothyroidism   . Abdominal aortic aneurysm ([place])   . CAD (coronary artery disease)   . SSS (sick sinus syndrome) ([place]) 10/05/2007    Medtronic Adapta  . Myocardial infarction ([place])   . Atrial fibrillation ([place])   . COPD (chronic obstructive pulmonary disease) ([place])   . Asthma     hx  . Anemia     hx  . Blood transfusion   . UTI (lower urinary tract infection)     hx  . Renal failure, chronic   . H/O hiatal hernia   . GERD (gastroesophageal reflux disease)   . Stroke ([place])   . Osteoporosis   . Blood transfusion without reported diagnosis   . Pacemaker     Past Surgical History  Procedure Laterality Date  . Pacemaker insertion  10/05/2007    Medtronic Adapta  . Coronary stent placement  x9  . Cardiac catheterization    . Fracture surgery      femur fx, /w ORIF into HIP- 2008  . Total hip arthroplasty      planned for 08/27/2011 Left  . Total hip arthroplasty  08/27/2011    Procedure: TOTAL HIP ARTHROPLASTY;  Surgeon: Kerin Salen, MD;  Location: [facility];  Service: Orthopedics;  Laterality: Right;  . US echocardiography  08/23/2011    EF 50%,LA mod. dilated,mild to mod. mitral annular ca+,mod. MR,mild to mod TR,mild to mod. PH,trace AI.  [name] Nm myocar perf wall motion  10/08/2010    mild apical anterior ischemia  . Colonoscopy         Medication List       This list is accurate as of: 06/26/15 11:59 PM.  Always use your most recent med list.               acetaminophen 325 MG tablet  Commonly known as:  TYLENOL  Take 2 tablets (650 mg total) by mouth every 6 (six) hours as needed for mild pain (or Fever >/= 101).     amiodarone 200 MG tablet  Commonly known as:  PACERONE  TAKE 1 TABLET BY MOUTH EVERY DAY     Biotin 1000 MCG tablet  Take 1,000 mcg by mouth daily.     CALCIUM 600+D 600-400 MG-UNIT tablet  Generic drug:  Calcium Carbonate-Vitamin D  Take 1 tablet by mouth daily.     clopidogrel 75 MG tablet  Commonly known as:  PLAVIX  TAKE 1 TABLET (75 MG TOTAL) BY MOUTH DAILY.     DELSYM 30 MG/5ML liquid  Generic drug:  dextromethorphan  Take 60 mg by mouth every 12 (twelve) hours. 7 day course started 06/20/15     ferrous sulfate 325 (65 FE) MG tablet  Take 325 mg by mouth daily.     furosemide 20 MG tablet  Commonly known as:  LASIX  Take 1 tablet (20 mg total) by mouth daily.     guaiFENesin 600 MG 12 hr tablet  Commonly known as:  MUCINEX  Take 600 mg by mouth every 12 (twelve) hours. 7 day course started 06/18/15     ipratropium-albuterol 0.5-2.5 (3) MG/3ML Soln  Commonly known as:  DUONEB  Inhale 3 mLs into the lungs every 4 (four) hours as needed (shortness of breath). 7 day course started 06/18/15: 3 times daily at midnight, 8am and 4pm, may also give one treatment every 4 hours as needed for shortness of breath     levofloxacin 500 MG tablet  Commonly known as:  LEVAQUIN  Take 1 tablet (500 mg total) by mouth every other day.     levothyroxine 125 MCG tablet  Commonly known as:  SYNTHROID, LEVOTHROID  TAKE 1 TABLET BY MOUTH EVERY DAY     nitroGLYCERIN 0.4 MG SL tablet  Commonly known as:  NITROSTAT  Place 1 tablet (0.4 mg total) under the tongue every 5 (five) minutes as needed for chest pain (For a total of 3 tablets, if chest pain persist to call 911).     NUTRITIONAL SUPPLEMENT PO   Take 120 mLs by mouth 2 (two) times daily. Medpass     feeding supplement Liqd  Take 237 mLs by mouth daily.     feeding supplement (ENSURE ENLIVE) Liqd  Take 237 mLs by mouth 2 (two) times daily between meals.     OXYGEN  Inhale into the lungs as needed (to keep O2 sat  above 90%).     pantoprazole 40 MG tablet  Commonly known as:  PROTONIX  TAKE 1 TABLET (40 MG TOTAL) BY MOUTH DAILY.     potassium chloride 10 MEQ tablet  Commonly known as:  K-DUR  Take 10 mEq by mouth 2 (two) times daily with a meal. 9am, 6pm     promethazine 25 MG tablet  Commonly known as:  PHENERGAN  Take 25 mg by mouth every 6 (six) hours as needed for nausea or vomiting.     rOPINIRole 0.25 MG tablet  Commonly known as:  REQUIP  Take 0.25 mg by mouth at bedtime.     vitamin B-12 1000 MCG tablet  Commonly known as:  CYANOCOBALAMIN  Take 1,000 mcg by mouth daily.        Immunization History  Administered Date(s) Administered  . Influenza,inj,Quad PF,36+ Mos 12/18/2013, 12/09/2014  . Pneumococcal Conjugate-13 12/09/2014    Social History  Substance Use Topics  . Smoking status: Former Smoker    Types: Cigarettes  . Smokeless tobacco: Never Used     Comment: quit 40 yrs ago- was smoking 2 packs a day at the most  . Alcohol Use: No    Review of Systems  DATA OBTAINED: from patient, nurse,family GENERAL: No fever-complains of increased weakness SKIN: No itching, rash HEENT: His not really complain of any eye drainage or nasal drainage RESPIRATORY: Does not complaining of acute shortness of breath or increased cough again she does have recent history of pneumonia remains aspiration risk  CARDIAC: No chest pain, palpitations minmal lower extremity edema GI: No abdominal pain, No N/V/D or constipation, No heartburn or reflux ;  GU: No frequency or urgency, MUSCULOSKELETAL: No unrelieved bone/joint pain NEUROLOGIC: No headache, dizziness  PSYCHIATRIC: No overt anxiety or  sadness   Physical Exam She is afebrile pulse is irregular in the 80s-respirations 16 blood pressure is pending O2 saturation is in the 90s  GENERAL APPEARANCE: Alert, conversant, No acute distress sitting comfortably in her chair but appears quite weak SKIN: No diaphoresis HEENT: pt is very HOH--visual acuity appears grossly intact RESPIRATORY: No labored breathing shallow air entry no overt congestio CARDIOVASCULAR: Heart RRR with some irregular beats no murmurs, rubs or gallops--normal-minimal lower extremity edema GASTROINTESTINAL: Abdomen is soft, non-tender, not distended w/ normal bowel sounds.  GENITOURINARY: Bladder non tender, not distended  MUSCULOSKELETAL: No abnormal joints or musculature exam  is able to move all extremities--but appears quite frail NEUROLOGIC: Cranial nerves 2-12 grossly intact. Moves all extremities PSYCHIATRIC: Mood and affect appropriate to situation, no behavioral issues w Continues to be pleasant and conversant  Patient Active Problem List   Diagnosis Date Noted  . Transaminitis 06/24/2015  . HCAP (healthcare-associated pneumonia) 06/22/2015  . Pneumonia 06/22/2015  . COPD exacerbation ([place]) 06/21/2015  . Cough 06/09/2015  . Fever, unspecified 06/09/2015  . Chronic diastolic congestive heart failure ([place]) 04/24/2015  . Restless legs 04/24/2015  . Restless leg 04/24/2015  . Pain and swelling of left lower extremity 02/15/2015  . Candidiasis of perineum 02/15/2015  . Hemorrhoids 02/15/2015  . CKD (chronic kidney disease) stage 3, GFR 30-59 ml/min 02/06/2015  . Acute respiratory failure with hypoxia ([place]) 01/29/2015  . Acute pulmonary edema ([place]) 01/29/2015  . SOB (shortness of breath)   . Palliative care encounter   . Sepsis ([place]) 01/26/2015  . Cellulitis of leg, left 01/26/2015  . Protein-calorie malnutrition, severe ([place]) 11/06/2014  . Pressure ulcer 11/06/2014  . Acute diastolic CHF (congestive heart failure) ([place]) 11/04/2014  .  Weakness  11/04/2014  . Hypertensive heart disease with CHF (congestive heart failure) ([place]) 11/04/2014  . Macrocytic anemia 11/04/2014  . Cardiomyopathy, ischemic 11/04/2014  . CHF exacerbation ([place]) 11/03/2014  . Body mass index (BMI) of 20.0-20.9 in adult 06/23/2014  . Hard of hearing 06/23/2014  . Rectal bleeding 07/16/2013  . Hyperlipidemia, statin intolerance 04/30/2013  . Paroxysmal atrial fibrillation ([place]) 04/30/2013  . Pacemaker - dual chamber Medtronic 2009 04/30/2013  . AAA (abdominal aortic aneurysm) ([place]) 04/30/2013  . Bilateral carotid artery stenosis 04/30/2013  . PAD (peripheral artery disease) ([place]) 04/30/2013  . Constipation 08/31/2011  . Hypotension 08/28/2011  . S/P total hip arthroplasty 08/28/2011  . CAD (coronary artery disease) 08/28/2011  . Hypothyroid 08/28/2011  . Klebsiella cystitis 08/28/2011  . Thrombocytopenia ([place]) 08/28/2011  . SSS (sick sinus syndrome) ([place]) 10/05/2007   Labs. 06/26/2015.  Sodium 141 potassium 4.4 BUN 12 creatinine 1.17.  Albumin 2.1.  06/24/2015  Liver function tests notable for AST of 88 ALT of 57.  WBC 5.8 hemoglobin 11.1 platelets 185  05/23/2015.  Sodium 138 potassium 4.2 BUN 22 creatinine 1.35.  04/25/2015.  WBC 6.6 hemoglobin 11.4 platelets 120  04/16/2015.  TSH-4.16.  03/04/2015.  Sodium 141 potassium 4.1 BUN 24 creatinine 1.3.  WBC 5.6 hemoglobin 10.1 platelets 127 CBC    Component Value Date/Time   WBC 5.8 06/24/2015 0648   WBC 6.4 08/01/2014 1401   RBC 3.73* 06/24/2015 0648   RBC 3.74* 08/01/2014 1401   HGB 11.1* 06/24/2015 0648   HCT 36.5 06/24/2015 0648   HCT 38.0 08/01/2014 1401   PLT 185 06/24/2015 0648   PLT 146* 08/01/2014 1401   MCV 97.9 06/24/2015 0648   MCV 102* 08/01/2014 1401   LYMPHSABS 1.2 06/23/2015 0352   LYMPHSABS 1.4 08/01/2014 1401   MONOABS 0.6 06/23/2015 0352   EOSABS 0.1 06/23/2015 0352   EOSABS 0.1 08/01/2014 1401   BASOSABS 0.0 06/23/2015 0352   BASOSABS 0.0 08/01/2014  1401    CMP     Component Value Date/Time   NA 141 06/26/2015 0540   NA 144 08/01/2014 1401   K 4.4 06/26/2015 0540   CL 106 06/26/2015 0540   CO2 27 06/26/2015 0540   GLUCOSE 107* 06/26/2015 0540   GLUCOSE 88 08/01/2014 1401   BUN 12 06/26/2015 0540   BUN 24 08/01/2014 1401   CREATININE 1.17* 06/26/2015 0540   CREATININE 1.16* 01/22/2015 1203   CALCIUM 8.5* 06/26/2015 0540   PROT 4.9* 06/24/2015 0648   PROT 6.0 08/01/2014 1401   ALBUMIN 2.1* 06/26/2015 0540   ALBUMIN 3.9 08/01/2014 1401   AST 88* 06/24/2015 0648   ALT 57* 06/24/2015 0648   ALKPHOS 73 06/24/2015 0648   BILITOT 0.7 06/24/2015 0648   BILITOT 0.3 08/01/2014 1401   GFRNONAA 38* 06/26/2015 0540   GFRAA 44* 06/26/2015 0540    Assessment and Plan   History of healthcare associated pneumonia-she is completing a course of Levaquin she is also on nebulizers as needed this will have to be encouraged.        Atrial fibrillation appearsr to be rate controlled she is on amiodarone does continue on Plavix as well for anticoagulation.--More aggressive anticoagulation considered not warranted secondary to bleeding risk weakness concern for falls   Anemia-continues on iron most recent hemoglobin 11.1 will update this and actually 5 days.      history of possible diastolic CHF she is on low-dose Lasix-edema appears to be fairly minimal--she also is on potassium supplementation-again will need  an updated metabolic panel and a proximally 5 days.  \       History chronic kidney disease creatinine  This appears relatively baseline with creatinine 1.17 on April 13-again update drawn in approximately 5 days  History hypothyroidism she is on Synthroid recent TSH was within normal limits at 4.16 on 04/16/2015.     History of restless legs- She is on low-dose Requip 0.25 mg daily at bedtime apparently this has helped-.  Again recommendation for palliative care consult with patient's increased weakness and  guarded prognosis-will write for this-per discussion with family they are aware of her general frailty and I feel palliative care would be a beneficial discussion    [phone]- Of note greater than 35 minutes spent assessing patient-discussing her status with her family at bedside-reviewing her chart-and coordinating a plan of care for numerous diagnoses-of note greater than 50% of time spent coordinating plan of care     .       Kenn Rekowski C,

## 2015-06-26 NOTE — Consult Note (Signed)
Consultation Note Date: 06/26/2015   Patient Name: [name]  DOB: 1917/09/19  MRN: [id number]  Age / Sex: 82 y.o., female  PCP: Lauree Chandler, NP Referring Physician: Lavina Hamman, MD  Reason for Consultation: Establishing goals of care, Non pain symptom management, Pain control and Psychosocial/spiritual support   Clinical Assessment/Narrative:    Patient is a 82 yo female with history of CAD, PVD, Afib, SSS on PPM, TIA, COPD, hypothyroidism, CKD.  Continued physical, functional decline over the past six months   She currently lives at [facility] and has been complaining of cough/fatigue for 3 weeks so UA was done 2 weeks ago and was suggestive of a UTI per daughters ("mild") so she was given Augmentin for 10 days which she likely did no keep in her stomach as she was also having nausea and vomiting.   Due to worsening symptoms they decided to bring her to the ER.     This NP Wadie Lessen reviewed medical records, received report from team, assessed the patient and then meet at the patient's bedside along with her daughter  to discuss diagnosis, prognosis, GOC, EOL wishes disposition and options.   A detailed discussion was had today regarding advanced directives.  Concepts specific to code status, artifical feeding and hydration, continued IV antibiotics and rehospitalization was had.  The difference between a aggressive medical intervention path  and a palliative comfort care path for this patient at this time was had.  Values and goals of care important to patient and family were attempted to be elicited.  Concept of Hospice and Palliative Care were discussed  Natural trajectory and expectations at EOL were discussed.  Questions and concerns addressed.  Hard Choices booklet left for review. Family encouraged to call with questions or concerns.  PMT will continue to support holistically.   Primary  Decision Maker: Daughter    HCPOA: none documented    SUMMARY OF RECOMMENDATIONS  Code Status/Advance Care Planning:  DNR      Code Status Orders        Start     Ordered   06/22/15 1937  Do not attempt resuscitation (DNR)   Continuous    Question Answer Comment  In the event of cardiac or respiratory ARREST Do not call a "code blue"   In the event of cardiac or respiratory ARREST Do not perform Intubation, CPR, defibrillation or ACLS   In the event of cardiac or respiratory ARREST Use medication by any route, position, wound care, and other measures to relive pain and suffering. May use oxygen, suction and manual treatment of airway obstruction as needed for comfort.      06/22/15 1937    Code Status History    Date Active Date Inactive Code Status Order ID Comments User Context   01/26/2015  6:01 PM 02/01/2015  2:56 PM DNR [id number]  Debbe Odea, MD Inpatient   01/26/2015  4:47 PM 01/26/2015  6:01 PM Full Code [id number]  Debbe Odea, MD Inpatient   01/26/2015 10:36 AM 01/26/2015  4:47 PM DNR [id number]  Harvel Quale, MD ED   11/04/2014  2:07 AM 11/07/2014  5:53 PM DNR [id number]  Theressa Millard, MD Inpatient    Advance Directive Documentation        Most Recent Value   Type of Advance Directive  Healthcare Power of Attorney, Living will, Out of facility DNR (pink MOST or yellow form)   Pre-existing out of facility DNR order (yellow form or  pink MOST form)  Yellow form placed in chart (order not valid for inpatient use)   "MOST" Form in Place?        Other Directives:None    Palliative Prophylaxis:   Bowel Regimen, Frequent Pain Assessment, Oral Care and Turn Reposition  Additional Recommendations (Limitations, Scope, Preferences):  Full Comfort Care  MOST form introduced, to be f/u in facility once she is receiving hospice services  Psycho-social/Spiritual:  Support System: Fair  Additional Recommendations: Education on Hospice  Prognosis: ess than  6 months  Discharge Planning: [place] with Hospice   Chief Complaint/ Primary Diagnoses: Present on Admission:  . HCAP (healthcare-associated pneumonia) . Chronic diastolic congestive heart failure ([place]) . Hypothyroid . Pacemaker - dual chamber Medtronic 2009 . CAD (coronary artery disease)  I have reviewed the medical record, interviewed the patient and family, and examined the patient. The following aspects are pertinent.  Past Medical History  Diagnosis Date  . GI bleed 2007  . Hypertension   . TIA (transient ischemic attack)   . Hypothyroidism   . Abdominal aortic aneurysm ([place])   . CAD (coronary artery disease)   . SSS (sick sinus syndrome) ([place]) 10/05/2007    Medtronic Adapta  . Myocardial infarction ([place])   . Atrial fibrillation ([place])   . COPD (chronic obstructive pulmonary disease) ([place])   . Asthma     hx  . Anemia     hx  . Blood transfusion   . UTI (lower urinary tract infection)     hx  . Renal failure, chronic   . H/O hiatal hernia   . GERD (gastroesophageal reflux disease)   . Stroke ([place])   . Osteoporosis   . Blood transfusion without reported diagnosis   . Pacemaker    Social History   Social History  . Marital Status: Widowed    Spouse Name: N/A  . Number of Children: 4  . Years of Education: N/A   Occupational History  . Retired    Social History Main Topics  . Smoking status: Former Smoker    Types: Cigarettes  . Smokeless tobacco: Never Used     Comment: quit 40 yrs ago- was smoking 2 packs a day at the most  . Alcohol Use: No  . Drug Use: No  . Sexual Activity: Yes    Birth Control/ Protection: Post-menopausal   Other Topics Concern  . None   Social History Narrative   Diet:      Do you drink/ eat things with caffeine? yes      Marital status:    widow                           What year were you married ?  1940      Do you live in a house, apartment,assistred living, condo, trailer, etc.)?  Mobile home        Is it one or more stories? 1      How many persons live in your home ?  1      Do you have any pets in your home ?(please list)  1 cat      Current or past profession: factory worker      Do you exercise?  yes                           Type & how often: small amount  Do you have a living will?  no      Do you have a DNR form?     no                  If not, do you want to discuss one?       Do you have signed POA?HPOA forms?  no               If so, please bring to your        appointment         Family History  Problem Relation Age of Onset  . Emphysema Father   . Heart disease Mother   . Cancer Brother   . Cancer Brother   . Cancer Sister   . Cancer Sister   . Diabetes Daughter   . Atrial fibrillation Daughter   . Atrial fibrillation Daughter   . Stroke Daughter   . Heart Problems Son    Scheduled Meds: . amiodarone  200 mg Oral Daily  . clopidogrel  75 mg Oral Daily  . enoxaparin (LOVENOX) injection  30 mg Subcutaneous Q24H  . feeding supplement  1 Container Oral Q24H  . feeding supplement (ENSURE ENLIVE)  237 mL Oral BID BM  . ferrous sulfate  325 mg Oral Q breakfast  . [START ON 06/27/2015] levofloxacin  500 mg Oral Q48H  . levothyroxine  125 mcg Oral QAC breakfast  . nystatin-triamcinolone ointment   Topical BID  . pantoprazole  40 mg Oral Daily  . rOPINIRole  0.25 mg Oral QHS  . vitamin B-12  1,000 mcg Oral Daily   Continuous Infusions:  PRN Meds:.acetaminophen, albuterol, nitroGLYCERIN, ondansetron (ZOFRAN) IV Medications Prior to Admission:  Prior to Admission medications   Medication Sig Start Date End Date Taking? Authorizing Provider  acetaminophen (TYLENOL) 325 MG tablet Take 2 tablets (650 mg total) by mouth every 6 (six) hours as needed for mild pain (or Fever >/= 101). 02/01/15  Yes Bonnielee Haff, MD  amiodarone (PACERONE) 200 MG tablet TAKE 1 TABLET BY MOUTH EVERY DAY 02/10/15  Yes Mihai Croitoru, MD  Biotin 1000 MCG tablet Take 1,000 mcg  by mouth daily.   Yes Historical Provider, MD  Calcium Carbonate-Vitamin D (CALCIUM 600+D) 600-400 MG-UNIT tablet Take 1 tablet by mouth daily.   Yes Historical Provider, MD  clopidogrel (PLAVIX) 75 MG tablet TAKE 1 TABLET (75 MG TOTAL) BY MOUTH DAILY. 01/28/15  Yes Mihai Croitoru, MD  dextromethorphan (DELSYM) 30 MG/5ML liquid Take 60 mg by mouth every 12 (twelve) hours. 7 day course started 06/20/15   Yes Historical Provider, MD  ferrous sulfate 325 (65 FE) MG tablet Take 325 mg by mouth daily.    Yes Historical Provider, MD  furosemide (LASIX) 20 MG tablet Take 1 tablet (20 mg total) by mouth daily. 11/07/14  Yes Domenic Polite, MD  guaiFENesin (MUCINEX) 600 MG 12 hr tablet Take 600 mg by mouth every 12 (twelve) hours. 7 day course started 06/18/15   Yes Historical Provider, MD  ipratropium-albuterol (DUONEB) 0.5-2.5 (3) MG/3ML SOLN Inhale 3 mLs into the lungs every 4 (four) hours as needed (shortness of breath). 7 day course started 06/18/15: 3 times daily at midnight, 8am and 4pm, may also give one treatment every 4 hours as needed for shortness of breath 06/18/15  Yes Historical Provider, MD  levothyroxine (SYNTHROID, LEVOTHROID) 125 MCG tablet TAKE 1 TABLET BY MOUTH EVERY DAY Patient taking differently: TAKE 1 TABLET BY MOUTH EVERY DAY BEFORE BREAKFAST  10/30/13  Yes Lorretta Harp, MD  loperamide (IMODIUM) 2 MG capsule Take 1 capsule (2 mg total) by mouth daily as needed for diarrhea or loose stools. 02/01/15  Yes Bonnielee Haff, MD  nitroGLYCERIN (NITROSTAT) 0.4 MG SL tablet Place 1 tablet (0.4 mg total) under the tongue every 5 (five) minutes as needed for chest pain (For a total of 3 tablets, if chest pain persist to call 911). 10/15/14  Yes Lauree Chandler, NP  Nutritional Supplements (NUTRITIONAL SUPPLEMENT PO) Take 120 mLs by mouth 2 (two) times daily. Medpass   Yes Historical Provider, MD  OXYGEN Inhale into the lungs as needed (to keep O2 sat above 90%).   Yes Historical Provider, MD    pantoprazole (PROTONIX) 40 MG tablet TAKE 1 TABLET (40 MG TOTAL) BY MOUTH DAILY. 11/11/14  Yes Lauree Chandler, NP  potassium chloride (K-DUR) 10 MEQ tablet Take 10 mEq by mouth 2 (two) times daily with a meal. 9am, 6pm 06/19/15  Yes Historical Provider, MD  promethazine (PHENERGAN) 25 MG tablet Take 25 mg by mouth every 6 (six) hours as needed for nausea or vomiting.  05/12/15  Yes Historical Provider, MD  rOPINIRole (REQUIP) 0.25 MG tablet Take 0.25 mg by mouth at bedtime. 06/19/15  Yes Historical Provider, MD  vitamin B-12 (CYANOCOBALAMIN) 1000 MCG tablet Take 1,000 mcg by mouth daily.   Yes Historical Provider, MD   Allergies  Allergen Reactions  . Nubain [Nalbuphine Hcl] Anaphylaxis  . Mirtazapine Other (See Comments)    "Weakness in legs"  . Pineapple Other (See Comments)    Listed on [facility] 06/2015  . Statins Other (See Comments)    Leg weakness  . Iodine Rash    Review of Systems  Constitutional: Positive for activity change and fatigue.  Neurological: Positive for weakness.    Physical Exam  Constitutional: She is oriented to person, place, and time. She appears ill.  -frail, elderly  HENT:  Mouth/Throat: Oropharynx is clear and moist.  Cardiovascular: Normal rate, regular rhythm and normal heart sounds.   Respiratory: She has decreased breath sounds in the right lower field and the left lower field.  GI: Soft. Bowel sounds are normal.  Neurological: She is alert and oriented to person, place, and time.  Skin: Skin is warm and dry.    Vital Signs: BP 138/75 mmHg  Pulse 75  Temp(Src) 98.1 F (36.7 C) (Oral)  Resp 18  Ht 5\' 1"  (1.549 m)  Wt 48.807 kg (107 lb 9.6 oz)  BMI 20.34 kg/m2  SpO2 97%  SpO2: SpO2: 97 % O2 Device:SpO2: 97 % O2 Flow Rate: .O2 Flow Rate (L/min): 2 L/min  IO: Intake/output summary:  Intake/Output Summary (Last 24 hours) at 06/26/15 1005 Last data filed at 06/26/15 0700  Gross per 24 hour  Intake    900 ml  Output      0 ml  Net    900 ml     LBM: Last BM Date: 06/24/15 Baseline Weight: Weight: 50.803 kg (112 lb) Most recent weight: Weight: 48.807 kg (107 lb 9.6 oz)      Palliative Assessment/Data:    Additional Data Reviewed:  CBC:    Component Value Date/Time   WBC 5.8 06/24/2015 0648   WBC 6.4 08/01/2014 1401   HGB 11.1* 06/24/2015 0648   HCT 36.5 06/24/2015 0648   HCT 38.0 08/01/2014 1401   PLT 185 06/24/2015 0648   PLT 146* 08/01/2014 1401   MCV 97.9 06/24/2015 0648   MCV 102*  08/01/2014 1401   NEUTROABS 4.3 06/23/2015 0352   NEUTROABS 4.0 08/01/2014 1401   LYMPHSABS 1.2 06/23/2015 0352   LYMPHSABS 1.4 08/01/2014 1401   MONOABS 0.6 06/23/2015 0352   EOSABS 0.1 06/23/2015 0352   EOSABS 0.1 08/01/2014 1401   BASOSABS 0.0 06/23/2015 0352   BASOSABS 0.0 08/01/2014 1401   Comprehensive Metabolic Panel:    Component Value Date/Time   NA 141 06/26/2015 0540   NA 144 08/01/2014 1401   K 4.4 06/26/2015 0540   CL 106 06/26/2015 0540   CO2 27 06/26/2015 0540   BUN 12 06/26/2015 0540   BUN 24 08/01/2014 1401   CREATININE 1.17* 06/26/2015 0540   CREATININE 1.16* 01/22/2015 1203   GLUCOSE 107* 06/26/2015 0540   GLUCOSE 88 08/01/2014 1401   CALCIUM 8.5* 06/26/2015 0540   AST 88* 06/24/2015 0648   ALT 57* 06/24/2015 0648   ALKPHOS 73 06/24/2015 0648   BILITOT 0.7 06/24/2015 0648   BILITOT 0.3 08/01/2014 1401   PROT 4.9* 06/24/2015 0648   PROT 6.0 08/01/2014 1401   ALBUMIN 2.1* 06/26/2015 0540   ALBUMIN 3.9 08/01/2014 1401     Time In: 1030 Time Out: 1145 Time Total: 75 min Greater than 50%  of this time was spent counseling and coordinating care related to the above assessment and plan.  Signed by: Wadie Lessen, NP  Knox Royalty, NP  06/26/2015, 10:05 AM  Please contact Palliative Medicine Team phone at [phone] for questions and concerns.

## 2015-06-26 NOTE — Discharge Summary (Addendum)
[facility] Discharge Summary   Patient: [name] [id number]   PCP: Lauree Chandler, NP DOB: [birth year]   Date of admission: 06/22/2015   Date of discharge:  06/26/2015    Discharge Diagnoses:  Principal Problem:   HCAP (healthcare-associated pneumonia) Active Problems:   CAD (coronary artery disease)   Hypothyroid   Pacemaker - dual chamber Medtronic 2009   Hard of hearing   Chronic diastolic congestive heart failure ([place])  Recommendations for Outpatient Follow-up:  1. Please follow-up with PCP in one week  2. Patient was definitely benefit from continuation of palliative care consultation at the facility regarding goals of care and possible hospice transition 3. Patient is ideally a hospice candidate and should be referred to hospice at the facility.  Follow-up Information    Follow up with Dewaine Oats, JESSICA K, NP. Schedule an appointment as soon as possible for a visit in 1 week.   Specialty:  Geriatric Medicine   Contact information:   [address] [place] [address] [phone]      Diet recommendation: Heart healthy Dysphagia 3 (Mech soft) solids;Thin liquid Dysphagia 3 (Mech soft) solids;Thin liquid   Liquid Administration via Cup;No straw  Medication Administration Whole meds with puree  Compensations Slow rate;Small sips/bites;Multiple dry swallows after each bite/sip;Follow solids with liquid  Postural Changes Seated upright at 90 degrees;Remain semi-upright after after feeds/meals (Comment)       Activity: The patient is advised to gradually reintroduce usual activities.  Discharge Condition: fair  History of present illness: As per the H and P dictated on admission, "Patient is a 82 yo female with history of CAD, PVD, Afib, SSS on PPM, TIA, COPD, hypothyroidism, CKD, hernia who came with cc of cough and fatigue for 3 weeks. She stays at [facility] and has been complaining of cough/fatigue for 3 weeks so UA was done 2 weeks ago and was  suggestive of a UTI per daughters ("mild") so she was given Augmentin for 10 days which she likely did no keep in her stomach as she was also having nausea and vomiting. She had two CXRs last week which were unremarkable per daughters. Due to worsening symptoms they decided to bring her to the ER. She has had no fever or chills. She had some preceding URI symptoms. She had no abdominal pain or diarrhea. She did not complain of dysuria but has had dryness/erythema in her vaginal/perineal area. Otherwise she has no complaints. "  Hospital Course:   Summary of her active problems in the hospital is as following. 1. HCAP (healthcare-associated pneumonia) Right basilar infiltrate, suspected aspiration pneumonia Patient presented with numbness of fatigue and shortness of breath due to recent hospitalization patient was started on Vancomycin and Zosyn. No blood cultures were obtained on the patient urine did have some evidence of pyuria but no evidence of urinary symptoms he had in the hospital. currently the patient will be transitioned to [place]. Speech therapy was consulted and patient underwent modified barium swallow test. Diet recommendation per speech therapy dysphagia type III  2. Goals of care discussion. Family discuss with palliative care here in the hospital. Most form was provided. The patient family will for the aforementioned facility.  3. Chronic Atrial fibrillation. Chronic diastolic dysfunction Continue amiodarone and Plavix. Not on any anticoagulation due to high risk of bleeding. Continue low-dose Lasix, can change to when necessary if oral intake remains in adequate. Does not appear to be hyper/hypovolemic at present  4. Suspected protein calorie malnutrition. Continue nutritional  supplementation.  5. GERD. Continue PPI.  6. Hypothyroidism. Continue Synthroid.  7. Dysphagia Speech therapy recommends dysphagia type 3 diet based on MBS Patient is at high risk for  developing recurrent aspiration pneumonia Provided the family option of hospice and they will continue following up with palliative care at the facility.  All other chronic medical condition were stable during the hospitalization.  Patient was seen by physical therapy, who recommended SNF, which was arranged by Education officer, museum and case Freight forwarder. On the day of the discharge the patient's vitals and oxygenation remained stable, and no other acute medical condition were reported by patient. the patient was felt safe to be discharge at SNF with therapy.  Procedures and Results:  MBS   Consultations:  Palliative care  DISCHARGE MEDICATION: Current Discharge Medication List    START taking these medications   Details  levofloxacin (LEVAQUIN) 500 MG tablet Take 1 tablet (500 mg total) by mouth every other day. Qty: 5 tablet, Refills: 0      CONTINUE these medications which have CHANGED   Details  !! feeding supplement (BOOST HIGH PROTEIN) LIQD Take 237 mLs by mouth daily. Qty: 14 Can, Refills: 0    !! feeding supplement, ENSURE ENLIVE, (ENSURE ENLIVE) LIQD Take 237 mLs by mouth 2 (two) times daily between meals. Qty: 237 mL, Refills: 12     !! - Potential duplicate medications found. Please discuss with provider.    CONTINUE these medications which have NOT CHANGED   Details  acetaminophen (TYLENOL) 325 MG tablet Take 2 tablets (650 mg total) by mouth every 6 (six) hours as needed for mild pain (or Fever >/= 101).    amiodarone (PACERONE) 200 MG tablet TAKE 1 TABLET BY MOUTH EVERY DAY Qty: 30 tablet, Refills: 4    Biotin 1000 MCG tablet Take 1,000 mcg by mouth daily.    Calcium Carbonate-Vitamin D (CALCIUM 600+D) 600-400 MG-UNIT tablet Take 1 tablet by mouth daily.    clopidogrel (PLAVIX) 75 MG tablet TAKE 1 TABLET (75 MG TOTAL) BY MOUTH DAILY. Qty: 90 tablet, Refills: 1    dextromethorphan (DELSYM) 30 MG/5ML liquid Take 60 mg by mouth every 12 (twelve) hours. 7 day course  started 06/20/15    ferrous sulfate 325 (65 FE) MG tablet Take 325 mg by mouth daily.     furosemide (LASIX) 20 MG tablet Take 1 tablet (20 mg total) by mouth daily. Qty: 30 tablet, Refills: 3   Associated Diagnoses: Congestive heart failure, unspecified congestive heart failure chronicity, unspecified congestive heart failure type (HCC)    guaiFENesin (MUCINEX) 600 MG 12 hr tablet Take 600 mg by mouth every 12 (twelve) hours. 7 day course started 06/18/15    ipratropium-albuterol (DUONEB) 0.5-2.5 (3) MG/3ML SOLN Inhale 3 mLs into the lungs every 4 (four) hours as needed (shortness of breath). 7 day course started 06/18/15: 3 times daily at midnight, 8am and 4pm, may also give one treatment every 4 hours as needed for shortness of breath    levothyroxine (SYNTHROID, LEVOTHROID) 125 MCG tablet TAKE 1 TABLET BY MOUTH EVERY DAY Qty: 30 tablet, Refills: 0    nitroGLYCERIN (NITROSTAT) 0.4 MG SL tablet Place 1 tablet (0.4 mg total) under the tongue every 5 (five) minutes as needed for chest pain (For a total of 3 tablets, if chest pain persist to call 911). Qty: 50 tablet, Refills: 3    !! Nutritional Supplements (NUTRITIONAL SUPPLEMENT PO) Take 120 mLs by mouth 2 (two) times daily. Medpass    OXYGEN Inhale  into the lungs as needed (to keep O2 sat above 90%).    pantoprazole (PROTONIX) 40 MG tablet TAKE 1 TABLET (40 MG TOTAL) BY MOUTH DAILY. Qty: 30 tablet, Refills: 3    potassium chloride (K-DUR) 10 MEQ tablet Take 10 mEq by mouth 2 (two) times daily with a meal. 9am, 6pm    promethazine (PHENERGAN) 25 MG tablet Take 25 mg by mouth every 6 (six) hours as needed for nausea or vomiting.     rOPINIRole (REQUIP) 0.25 MG tablet Take 0.25 mg by mouth at bedtime.    vitamin B-12 (CYANOCOBALAMIN) 1000 MCG tablet Take 1,000 mcg by mouth daily.     !! - Potential duplicate medications found. Please discuss with provider.    STOP taking these medications     loperamide (IMODIUM) 2 MG capsule         Allergies  Allergen Reactions  . Nubain [Nalbuphine Hcl] Anaphylaxis  . Mirtazapine Other (See Comments)    "Weakness in legs"  . Pineapple Other (See Comments)    Listed on [facility] 06/2015  . Statins Other (See Comments)    Leg weakness  . Iodine Rash   Discharge Instructions    Diet - low sodium heart healthy    Complete by:  As directed      Discharge instructions    Complete by:  As directed   It is important that you read following instructions as well as go over your medication list with RN to help you understand your care after this hospitalization.  Discharge Instructions: Please follow-up with PCP in one week  Please request your primary care physician to go over all Hospital Tests and Procedure/Radiological results at the follow up,  Please get all Hospital records sent to your PCP by signing hospital release before you go home.   Do not take more than prescribed Pain, Sleep and Anxiety Medications. You were cared for by a hospitalist during your hospital stay. If you have any questions about your discharge medications or the care you received while you were in the hospital after you are discharged, you can call the unit and ask to speak with the hospitalist on call if the hospitalist that took care of you is not available.  Once you are discharged, your primary care physician will handle any further medical issues. Please note that NO REFILLS for any discharge medications will be authorized once you are discharged, as it is imperative that you return to your primary care physician (or establish a relationship with a primary care physician if you do not have one) for your aftercare needs so that they can reassess your need for medications and monitor your lab values. You Must read complete instructions/literature along with all the possible adverse reactions/side effects for all the Medicines you take and that have been prescribed to you. Take any new Medicines after you have  completely understood and accept all the possible adverse reactions/side effects. Wear Seat belts while driving. If you have smoked or chewed Tobacco in the last 2 yrs please stop smoking and/or stop any Recreational drug use.     Increase activity slowly    Complete by:  As directed           Discharge Exam: Filed Weights   06/22/15 1551 06/22/15 2000  Weight: 50.803 kg (112 lb) 48.807 kg (107 lb 9.6 oz)   Filed Vitals:   06/25/15 2013 06/26/15 0522  BP: 142/108 138/75  Pulse: 75 75  Temp: 98.1 F (36.7  C) 98.1 F (36.7 C)  Resp: 18 18   General: Appear in mild distress, no Rash; Oral Mucosa moist. Cardiovascular: S1 and S2 Present, no Murmur,  Respiratory: Bilateral Air entry present and Clear to Auscultation, no Crackles, no wheezes Abdomen: Bowel Sound present, Soft and no tenderness Extremities: no Pedal edema, no calf tenderness Neurology: Grossly no focal neuro deficit.  The results of significant diagnostics from this hospitalization (including imaging, microbiology, ancillary and laboratory) are listed below for reference.    Significant Diagnostic Studies: Dg Chest Port 1 View  06/22/2015  CLINICAL DATA:  Cough and decreased appetite EXAM: PORTABLE CHEST 1 VIEW COMPARISON:  03/10/2015 FINDINGS: Cardiac shadow is again enlarged. Pacing device is again seen. The lungs are well aerated bilaterally. Some early right basilar infiltrate is noted. No acute bony abnormality is seen. IMPRESSION: Early right basilar infiltrate. Electronically Signed   By: Inez Catalina M.D.   On: 06/22/2015 17:28   Dg Swallowing Func-speech Pathology  06/25/2015  Objective Swallowing Evaluation: Type of Study: MBS-Modified Barium Swallow Study Patient Details Name: LONEY BALRAM MRN: [id number] Date of Birth: [birth year] Today's Date: 06/25/2015 Time: SLP Start Time (ACUTE ONLY): 1032-SLP Stop Time (ACUTE ONLY): 1042 SLP Time Calculation (min) (ACUTE ONLY): 10 min Past Medical History: Past Medical  History Diagnosis Date . GI bleed 2007 . Hypertension  . TIA (transient ischemic attack)  . Hypothyroidism  . Abdominal aortic aneurysm ([place])  . CAD (coronary artery disease)  . SSS (sick sinus syndrome) ([place]) 10/05/2007   Medtronic Adapta . Myocardial infarction ([place])  . Atrial fibrillation ([place])  . COPD (chronic obstructive pulmonary disease) ([place])  . Asthma    hx . Anemia    hx . Blood transfusion  . UTI (lower urinary tract infection)    hx . Renal failure, chronic  . H/O hiatal hernia  . GERD (gastroesophageal reflux disease)  . Stroke ([place])  . Osteoporosis  . Blood transfusion without reported diagnosis  . Pacemaker  Past Surgical History: Past Surgical History Procedure Laterality Date . Pacemaker insertion  10/05/2007   Medtronic Adapta . Coronary stent placement     x9 . Cardiac catheterization   . Fracture surgery     femur fx, /w ORIF into HIP- 2008 . Total hip arthroplasty     planned for 08/27/2011 Left . Total hip arthroplasty  08/27/2011   Procedure: TOTAL HIP ARTHROPLASTY;  Surgeon: Kerin Salen, MD;  Location: [place];  Service: Orthopedics;  Laterality: Right; . US echocardiography  08/23/2011   EF 50%,LA mod. dilated,mild to mod. mitral annular ca+,mod. MR,mild to mod TR,mild to mod. PH,trace AI. [name] Nm myocar perf wall motion  10/08/2010   mild apical anterior ischemia . Colonoscopy   HPI: 82 year old female admitted 06/22/15 with fatigue and cough for 3 weeks. PMH significant for COPD. Barium Swallow (2012) revealed moderate esophageal dysmotiity and mild GERD. CXR reveals RLL infiltrate, raising suspicion for aspiration. COPd increases risk of silent aspiration and esophageal issues raise suspicion for likelihood of poor esophageal clearing and/or reflux aspiration. Subjective: patient laying in bed reporting that she just doesn't feel well Assessment / Plan / Recommendation CHL IP CLINICAL IMPRESSIONS 06/25/2015 Therapy Diagnosis Moderate cervical esophageal phase dysphagia;Mild pharyngeal phase  dysphagia;Mild oral phase dysphagia Clinical Impression --Objective swallow assessment complete.  Patient with mild oropharyngeal motor based dysphagia marked by mild, general weakness resulting in mild residue from base of tongue to the valleculea.  Swallow is also marked by a delay in swallow  initiation with penetration occurring x1 with thin liquid residue that was cleared with a spontaneous second swallow.  This is compounded by esophageal issues that are characterized as backflow through the USE which is observed to enter the pharynx.  Patient with sensation of this and utilizes multiple swallows to manage/compensate.  Patient coughed throughout evaluation despite protecting her airway, which is likely related to sensed stasis in her esophagus (viewed during scan, no MD present to confirm). Despite today's observations of adequate airway protection patient remains at an increased aspiration risk and no modification to liquids or foods eliminates this risk.  Recommend a palliative care consult.  RN and patient educated on rationale for and importance for use of safe swallow compensatory strategies to minimize aspiration risk.  Patient verbalized understanding; however, recommend brief acute SLP follow up for education and carryover.     Impact on safety and function Moderate aspiration risk   CHL IP TREATMENT RECOMMENDATION 06/25/2015 Treatment Recommendations Therapy as outlined in treatment plan below   Prognosis 06/25/2015 Prognosis for Safe Diet Advancement Guarded Barriers to Reach Goals Severity of deficits;Time post onset Barriers/Prognosis Comment history of esophageal issues that patient reports nothing can be done for CHL IP DIET RECOMMENDATION 06/25/2015 SLP Diet Recommendations Dysphagia 3 (Mech soft) solids;Thin liquid Liquid Administration via Cup;No straw Medication Administration Whole meds with puree Compensations Slow rate;Small sips/bites;Multiple dry swallows after each bite/sip;Follow solids  with liquid Postural Changes Seated upright at 90 degrees;Remain semi-upright after after feeds/meals (Comment)   CHL IP OTHER RECOMMENDATIONS 06/25/2015 Recommended Consults -- Oral Care Recommendations Oral care BID Other Recommendations --   CHL IP FOLLOW UP RECOMMENDATIONS 06/25/2015 Follow up Recommendations 24 hour supervision/assistance   CHL IP FREQUENCY AND DURATION 06/25/2015 Speech Therapy Frequency (ACUTE ONLY) min 2x/week Treatment Duration 1 week      CHL IP ORAL PHASE 06/25/2015 Oral Phase WFL Oral - Pudding Teaspoon -- Oral - Pudding Cup -- Oral - Honey Teaspoon -- Oral - Honey Cup -- Oral - Nectar Teaspoon -- Oral - Nectar Cup -- Oral - Nectar Straw -- Oral - Thin Teaspoon -- Oral - Thin Cup -- Oral - Thin Straw -- Oral - Puree -- Oral - Mech Soft -- Oral - Regular -- Oral - Multi-Consistency -- Oral - Pill -- Oral Phase - Comment --  CHL IP PHARYNGEAL PHASE 06/25/2015 Pharyngeal Phase Impaired Pharyngeal- Pudding Teaspoon -- Pharyngeal -- Pharyngeal- Pudding Cup -- Pharyngeal -- Pharyngeal- Honey Teaspoon -- Pharyngeal -- Pharyngeal- Honey Cup -- Pharyngeal -- Pharyngeal- Nectar Teaspoon -- Pharyngeal -- Pharyngeal- Nectar Cup -- Pharyngeal -- Pharyngeal- Nectar Straw -- Pharyngeal -- Pharyngeal- Thin Teaspoon -- Pharyngeal -- Pharyngeal- Thin Cup Delayed swallow initiation-vallecula;Penetration/Apiration after swallow Pharyngeal Material enters airway, CONTACTS cords and then ejected out Pharyngeal- Thin Straw -- Pharyngeal -- Pharyngeal- Puree Delayed swallow initiation-vallecula;Other (Comment);Compensatory strategies attempted (with notebox) Pharyngeal Material does not enter airway Pharyngeal- Mechanical Soft Delayed swallow initiation-vallecula Pharyngeal Material does not enter airway Pharyngeal- Regular -- Pharyngeal -- Pharyngeal- Multi-consistency -- Pharyngeal -- Pharyngeal- Pill -- Pharyngeal -- Pharyngeal Comment --  CHL IP CERVICAL ESOPHAGEAL PHASE 06/25/2015 Cervical Esophageal Phase  Impaired Pudding Teaspoon -- Pudding Cup -- Honey Teaspoon -- Honey Cup -- Nectar Teaspoon -- Nectar Cup -- Nectar Straw -- Thin Teaspoon -- Thin Cup -- Thin Straw -- Puree -- Mechanical Soft -- Regular -- Multi-consistency -- Pill -- Cervical Esophageal Comment -- No flowsheet data found. [name], M.A., CCC-SLP [phone] [place] 06/25/2015, 2:01 PM  Microbiology: Recent Results (from the past 240 hour(s))  MRSA PCR Screening     Status: None   Collection Time: 06/22/15 10:32 PM  Result Value Ref Range Status   MRSA by PCR NEGATIVE NEGATIVE Final    Comment:        The GeneXpert MRSA Assay (FDA approved for NASAL specimens only), is one component of a comprehensive MRSA colonization surveillance program. It is not intended to diagnose MRSA infection nor to guide or monitor treatment for MRSA infections.      Labs: CBC:  Recent Labs Lab 06/22/15 1609 06/23/15 0352 06/24/15 0648  WBC 8.7 6.2 5.8  NEUTROABS 6.6 4.3  --   HGB 12.3 10.7* 11.1*  HCT 38.9 34.5* 36.5  MCV 96.8 96.6 97.9  PLT 197 178 [id number]   Basic Metabolic Panel:  Recent Labs Lab 06/22/15 1609 06/23/15 0352 06/24/15 0648 06/26/15 0540  NA 138 144 143 141  K 4.3 4.6 4.4 4.4  CL 96* 105 107 106  CO2 30 32 29 27  GLUCOSE 103* 88 80 107*  BUN 33* 27* 18 12  CREATININE 1.64* 1.50* 1.14* 1.17*  CALCIUM 8.8* 8.2* 8.5* 8.5*  PHOS  --   --   --  2.1*   Liver Function Tests:  Recent Labs Lab 06/22/15 1609 06/23/15 0352 06/24/15 0648 06/26/15 0540  AST 123* 98* 88*  --   ALT 71* 60* 57*  --   ALKPHOS 89 66 73  --   BILITOT 0.8 0.9 0.7  --   PROT 5.8* 4.8* 4.9*  --   ALBUMIN 2.6* 2.1* 2.2* 2.1*   No results for input(s): LIPASE, AMYLASE in the last 168 hours. No results for input(s): AMMONIA in the last 168 hours. Cardiac Enzymes: No results for input(s): CKTOTAL, CKMB, CKMBINDEX, TROPONINI in the last 168 hours. BNP (last 3 results)  Recent Labs  11/03/14 1807  01/28/15 0935 03/10/15 2225  BNP 377.4* 404.1* 182.3*   CBG: No results for input(s): GLUCAP in the last 168 hours. Time spent: 30 minutes  Signed:  Karmela Bram  [facility]  06/26/2015  , 1:15 PM

## 2015-06-26 NOTE — Progress Notes (Signed)
Pt ready for d/c to SNF per MD. Report was attempted to [employer] [facility], was unsuccessful. Was on hold for facilty for 7 minutes. Pt's belongings were gathered and sent with pt. She was transported to facility via [facility].   Update: Report was finally called to Mudlogger of nursing at [facility]. All questions answered.   [place], Jerry Caras

## 2015-06-27 ENCOUNTER — Encounter: Payer: Self-pay | Admitting: Internal Medicine

## 2015-06-27 ENCOUNTER — Non-Acute Institutional Stay (SKILLED_NURSING_FACILITY): Payer: Medicare Other | Admitting: Internal Medicine

## 2015-06-27 DIAGNOSIS — J690 Pneumonitis due to inhalation of food and vomit: Secondary | ICD-10-CM | POA: Diagnosis not present

## 2015-06-27 DIAGNOSIS — K219 Gastro-esophageal reflux disease without esophagitis: Secondary | ICD-10-CM | POA: Diagnosis not present

## 2015-06-27 DIAGNOSIS — R1312 Dysphagia, oropharyngeal phase: Secondary | ICD-10-CM | POA: Diagnosis not present

## 2015-06-27 DIAGNOSIS — I480 Paroxysmal atrial fibrillation: Secondary | ICD-10-CM

## 2015-06-27 DIAGNOSIS — J189 Pneumonia, unspecified organism: Secondary | ICD-10-CM | POA: Diagnosis not present

## 2015-06-27 DIAGNOSIS — E034 Atrophy of thyroid (acquired): Secondary | ICD-10-CM | POA: Diagnosis not present

## 2015-06-27 DIAGNOSIS — E43 Unspecified severe protein-calorie malnutrition: Secondary | ICD-10-CM | POA: Diagnosis not present

## 2015-06-27 DIAGNOSIS — R2689 Other abnormalities of gait and mobility: Secondary | ICD-10-CM | POA: Diagnosis not present

## 2015-06-27 DIAGNOSIS — E038 Other specified hypothyroidism: Secondary | ICD-10-CM | POA: Diagnosis not present

## 2015-06-27 DIAGNOSIS — I5032 Chronic diastolic (congestive) heart failure: Secondary | ICD-10-CM

## 2015-06-27 DIAGNOSIS — M6281 Muscle weakness (generalized): Secondary | ICD-10-CM | POA: Diagnosis not present

## 2015-06-27 DIAGNOSIS — R1310 Dysphagia, unspecified: Secondary | ICD-10-CM | POA: Diagnosis not present

## 2015-06-27 NOTE — Progress Notes (Signed)
MRN: [id number] Name: [name]  Sex: female Age: 82 y.o. DOB: [birth year]  [place] #: [facility] Facility/Room:210 Level Of Care: SNF Provider: Inocencio Homes D Emergency Contacts: Extended Emergency Contact Information Primary Emergency Contact: [name],[name] Address: [address]          [place], [place] [address] [name] of [place] Phone: [phone] Mobile Phone: [phone] Relation: Daughter Secondary Emergency Contact: [name] Address: [place],  [address] [name] of [place] Phone: [phone] Work Phone: [phone] Mobile Phone: [phone] Relation: Daughter  Code Status:   Allergies: Nubain; Mirtazapine; Pineapple; Statins; and Iodine  Chief Complaint  Patient presents with  . New Admit To SNF    HPI: Patient is 82 y.o. female with CAD, PVD, Afib, SSS on PPM, TIA, COPD, hypothyroidism, CKD, hernia who came with cc of cough and fatigue for 3 weeks. She stays at [facility] and has been complaining of cough/fatigue for 3 weeks so UA was done 2 weeks ago and was suggestive of a UTI per daughters ("mild") so she was given Augmentin for 10 days which she likely did no keep in her stomach as she was also having nausea and vomiting. She had two CXRs last week which were unremarkable per daughters. Due to worsening symptoms they decided to bring her to the ER. She has had no fever or chills. She had some preceding URI symptoms. Pt was admitted to [facility] where she was treated for aspiration PNA. Pt is admitted to SNF with generalized weakness for OT/PT. While at SNF pt will be followed for AF, tx with amiodarone and plavix, CHF, tx with lasix and GERD, tx with protonix.  Past Medical History  Diagnosis Date  . GI bleed 2007  . Hypertension   . TIA (transient ischemic attack)   . Hypothyroidism   . Abdominal aortic aneurysm ([place])   . CAD (coronary artery disease)   . SSS (sick sinus syndrome) ([place]) 10/05/2007    Medtronic  Adapta  . Myocardial infarction ([place])   . Atrial fibrillation ([place])   . COPD (chronic obstructive pulmonary disease) ([place])   . Asthma     hx  . Anemia     hx  . Blood transfusion   . UTI (lower urinary tract infection)     hx  . Renal failure, chronic   . H/O hiatal hernia   . GERD (gastroesophageal reflux disease)   . Stroke ([place])   . Osteoporosis   . Blood transfusion without reported diagnosis   . Pacemaker     Past Surgical History  Procedure Laterality Date  . Pacemaker insertion  10/05/2007    Medtronic Adapta  . Coronary stent placement      x9  . Cardiac catheterization    . Fracture surgery      femur fx, /w ORIF into HIP- 2008  . Total hip arthroplasty      planned for 08/27/2011 Left  . Total hip arthroplasty  08/27/2011    Procedure: TOTAL HIP ARTHROPLASTY;  Surgeon: Kerin Salen, MD;  Location: [place];  Service: Orthopedics;  Laterality: Right;  . US echocardiography  08/23/2011    EF 50%,LA mod. dilated,mild to mod. mitral annular ca+,mod. MR,mild to mod TR,mild to mod. PH,trace AI.  [name] Nm myocar perf wall motion  10/08/2010    mild apical anterior ischemia  . Colonoscopy        Medication List  This list is accurate as of: 06/27/15 11:59 PM.  Always use your most recent med list.               acetaminophen 325 MG tablet  Commonly known as:  TYLENOL  Take 2 tablets (650 mg total) by mouth every 6 (six) hours as needed for mild pain (or Fever >/= 101).     amiodarone 200 MG tablet  Commonly known as:  PACERONE  TAKE 1 TABLET BY MOUTH EVERY DAY     Biotin 1000 MCG tablet  Take 1,000 mcg by mouth daily.     CALCIUM 600+D 600-400 MG-UNIT tablet  Generic drug:  Calcium Carbonate-Vitamin D  Take 1 tablet by mouth daily.     clopidogrel 75 MG tablet  Commonly known as:  PLAVIX  TAKE 1 TABLET (75 MG TOTAL) BY MOUTH DAILY.     DELSYM 30 MG/5ML liquid  Generic drug:  dextromethorphan  Take 60 mg by mouth every 12 (twelve) hours. 7 day course  started 06/20/15     ferrous sulfate 325 (65 FE) MG tablet  Take 325 mg by mouth daily.     furosemide 20 MG tablet  Commonly known as:  LASIX  Take 1 tablet (20 mg total) by mouth daily.     guaiFENesin 600 MG 12 hr tablet  Commonly known as:  MUCINEX  Take 600 mg by mouth every 12 (twelve) hours. 7 day course started 06/18/15     ipratropium-albuterol 0.5-2.5 (3) MG/3ML Soln  Commonly known as:  DUONEB  Inhale 3 mLs into the lungs every 4 (four) hours as needed (shortness of breath). 7 day course started 06/18/15: 3 times daily at midnight, 8am and 4pm, may also give one treatment every 4 hours as needed for shortness of breath     levofloxacin 500 MG tablet  Commonly known as:  LEVAQUIN  Take 1 tablet (500 mg total) by mouth every other day.     levothyroxine 125 MCG tablet  Commonly known as:  SYNTHROID, LEVOTHROID  TAKE 1 TABLET BY MOUTH EVERY DAY     nitroGLYCERIN 0.4 MG SL tablet  Commonly known as:  NITROSTAT  Place 1 tablet (0.4 mg total) under the tongue every 5 (five) minutes as needed for chest pain (For a total of 3 tablets, if chest pain persist to call 911).     NUTRITIONAL SUPPLEMENT PO  Take 120 mLs by mouth 2 (two) times daily. Medpass     feeding supplement Liqd  Take 237 mLs by mouth daily.     feeding supplement (ENSURE ENLIVE) Liqd  Take 237 mLs by mouth 2 (two) times daily between meals.     OXYGEN  Inhale into the lungs as needed (to keep O2 sat above 90%).     pantoprazole 40 MG tablet  Commonly known as:  PROTONIX  TAKE 1 TABLET (40 MG TOTAL) BY MOUTH DAILY.     potassium chloride 10 MEQ tablet  Commonly known as:  K-DUR  Take 10 mEq by mouth 2 (two) times daily with a meal. 9am, 6pm     promethazine 25 MG tablet  Commonly known as:  PHENERGAN  Take 25 mg by mouth every 6 (six) hours as needed for nausea or vomiting.     rOPINIRole 0.25 MG tablet  Commonly known as:  REQUIP  Take 0.25 mg by mouth at bedtime.     vitamin B-12 1000 MCG tablet   Commonly known as:  CYANOCOBALAMIN  Take 1,000 mcg  by mouth daily.        No orders of the defined types were placed in this encounter.    Immunization History  Administered Date(s) Administered  . Influenza,inj,Quad PF,36+ Mos 12/18/2013, 12/09/2014  . Pneumococcal Conjugate-13 12/09/2014    Social History  Substance Use Topics  . Smoking status: Former Smoker    Types: Cigarettes  . Smokeless tobacco: Never Used     Comment: quit 40 yrs ago- was smoking 2 packs a day at the most  . Alcohol Use: No    Family history is +HD, DM2  Review of Systems  DATA OBTAINED: from patient, nurse GENERAL:  no fevers, fatigue, appetite changes SKIN: No itching, rash or wounds EYES: No eye pain, redness, discharge EARS: No earache, tinnitus, change in hearing NOSE: No congestion, drainage or bleeding  MOUTH/THROAT: No mouth or tooth pain, No sore throat RESPIRATORY: No cough, wheezing, SOB CARDIAC: No chest pain, palpitations, lower extremity edema  GI: No abdominal pain, No N/V/D or constipation, No heartburn or reflux  GU: No dysuria, frequency or urgency, or incontinence  MUSCULOSKELETAL: No unrelieved bone/joint pain NEUROLOGIC: No headache, dizziness or focal weakness PSYCHIATRIC: No c/o anxiety or sadness   Filed Vitals:   07/04/15 2124  BP: 143/73  Pulse: 71  Temp: 97.4 F (36.3 C)  Resp: 16    SpO2 Readings from Last 1 Encounters:  06/26/15 97%        Physical Exam  GENERAL APPEARANCE: Alert, conversant,  No acute distress.  SKIN: No diaphoresis rash HEAD: Normocephalic, atraumatic  EYES: Conjunctiva/lids clear. Pupils round, reactive. EOMs intact.  EARS: External exam WNL, canals clear. Hearing grossly normal.  NOSE: No deformity or discharge.  MOUTH/THROAT: Lips w/o lesions  RESPIRATORY: Breathing is even, unlabored. Lung sounds are slt rhonchi  CARDIOVASCULAR: Heart RRR no murmurs, rubs or gallops. No peripheral edema.   GASTROINTESTINAL: Abdomen is  soft, non-tender, not distended w/ normal bowel sounds. GENITOURINARY: Bladder non tender, not distended  MUSCULOSKELETAL: No abnormal joints or musculature NEUROLOGIC:  Cranial nerves 2-12 grossly intact. Moves all extremities  PSYCHIATRIC: Mood and affect appropriate to situation, no behavioral issues  Patient Active Problem List   Diagnosis Date Noted  . GERD (gastroesophageal reflux disease) 07/04/2015  . Dysphagia 07/04/2015  . Transaminitis 06/24/2015  . HCAP (healthcare-associated pneumonia) 06/22/2015  . Pneumonia 06/22/2015  . COPD exacerbation ([place]) 06/21/2015  . Cough 06/09/2015  . Fever, unspecified 06/09/2015  . Chronic diastolic congestive heart failure ([place]) 04/24/2015  . Restless legs 04/24/2015  . Restless leg 04/24/2015  . Pain and swelling of left lower extremity 02/15/2015  . Candidiasis of perineum 02/15/2015  . Hemorrhoids 02/15/2015  . CKD (chronic kidney disease) stage 3, GFR 30-59 ml/min 02/06/2015  . Acute respiratory failure with hypoxia ([place]) 01/29/2015  . Acute pulmonary edema ([place]) 01/29/2015  . SOB (shortness of breath)   . Palliative care encounter   . Sepsis ([place]) 01/26/2015  . Cellulitis of leg, left 01/26/2015  . Protein-calorie malnutrition, severe ([place]) 11/06/2014  . Pressure ulcer 11/06/2014  . Acute diastolic CHF (congestive heart failure) ([place]) 11/04/2014  . Weakness 11/04/2014  . Hypertensive heart disease with CHF (congestive heart failure) ([place]) 11/04/2014  . Macrocytic anemia 11/04/2014  . Cardiomyopathy, ischemic 11/04/2014  . CHF exacerbation ([place]) 11/03/2014  . Body mass index (BMI) of 20.0-20.9 in adult 06/23/2014  . Hard of hearing 06/23/2014  . Rectal bleeding 07/16/2013  . Hyperlipidemia, statin intolerance 04/30/2013  . Paroxysmal atrial fibrillation ([place]) 04/30/2013  .  Pacemaker - dual chamber Medtronic 2009 04/30/2013  . AAA (abdominal aortic aneurysm) ([place]) 04/30/2013  . Bilateral carotid artery stenosis 04/30/2013  .  PAD (peripheral artery disease) ([place]) 04/30/2013  . Constipation 08/31/2011  . Hypotension 08/28/2011  . S/P total hip arthroplasty 08/28/2011  . CAD (coronary artery disease) 08/28/2011  . Hypothyroid 08/28/2011  . Klebsiella cystitis 08/28/2011  . Thrombocytopenia ([place]) 08/28/2011  . SSS (sick sinus syndrome) ([facility]) 10/05/2007    CBC    Component Value Date/Time   WBC 5.8 06/24/2015 0648   WBC 6.4 08/01/2014 1401   RBC 3.73* 06/24/2015 0648   RBC 3.74* 08/01/2014 1401   HGB 11.1* 06/24/2015 0648   HCT 36.5 06/24/2015 0648   HCT 38.0 08/01/2014 1401   PLT 185 06/24/2015 0648   PLT 146* 08/01/2014 1401   MCV 97.9 06/24/2015 0648   MCV 102* 08/01/2014 1401   LYMPHSABS 1.2 06/23/2015 0352   LYMPHSABS 1.4 08/01/2014 1401   MONOABS 0.6 06/23/2015 0352   EOSABS 0.1 06/23/2015 0352   EOSABS 0.1 08/01/2014 1401   BASOSABS 0.0 06/23/2015 0352   BASOSABS 0.0 08/01/2014 1401    CMP     Component Value Date/Time   NA 141 06/26/2015 0540   NA 144 08/01/2014 1401   K 4.4 06/26/2015 0540   CL 106 06/26/2015 0540   CO2 27 06/26/2015 0540   GLUCOSE 107* 06/26/2015 0540   GLUCOSE 88 08/01/2014 1401   BUN 12 06/26/2015 0540   BUN 24 08/01/2014 1401   CREATININE 1.17* 06/26/2015 0540   CREATININE 1.16* 01/22/2015 1203   CALCIUM 8.5* 06/26/2015 0540   PROT 4.9* 06/24/2015 0648   PROT 6.0 08/01/2014 1401   ALBUMIN 2.1* 06/26/2015 0540   ALBUMIN 3.9 08/01/2014 1401   AST 88* 06/24/2015 0648   ALT 57* 06/24/2015 0648   ALKPHOS 73 06/24/2015 0648   BILITOT 0.7 06/24/2015 0648   BILITOT 0.3 08/01/2014 1401   GFRNONAA 38* 06/26/2015 0540   GFRAA 44* 06/26/2015 0540    Lab Results  Component Value Date   HGBA1C  02/27/2007    5.8 (NOTE)   The ADA recommends the following therapeutic goals for glycemic   control related to Hgb A1C measurement:   Goal of Therapy:   < 7.0% Hgb A1C   Action Suggested:  > 8.0% Hgb A1C   Ref:  Diabetes Care, 22, Suppl. 1, 1999     Dg Chest Port 1  View  06/22/2015  CLINICAL DATA:  Cough and decreased appetite EXAM: PORTABLE CHEST 1 VIEW COMPARISON:  03/10/2015 FINDINGS: Cardiac shadow is again enlarged. Pacing device is again seen. The lungs are well aerated bilaterally. Some early right basilar infiltrate is noted. No acute bony abnormality is seen. IMPRESSION: Early right basilar infiltrate. Electronically Signed   By: Inez Catalina M.D.   On: 06/22/2015 17:28    Not all labs, radiology exams or other studies done during hospitalization come through on my EPIC note; however they are reviewed by me.    Assessment and Plan  HCAP (healthcare-associated pneumonia) Right basilar infiltrate, suspected aspiration pneumonia Patient presented with numbness of fatigue and shortness of breath due to recent hospitalization patient was started on Vancomycin and Zosyn. No blood cultures were obtained on the patient urine did have some evidence of pyuria but no evidence of urinary symptoms he had in the hospital. currently the patient will be transitioned to [place]. Speech therapy was consulted and patient underwent modified barium swallow test. Diet recommendation per speech therapy  dysphagia type III SNF - will cont levaquin q 48 for 7 days  Paroxysmal atrial fibrillation (HCC) SNF - Continue amiodarone and Plavix. Not on any anticoagulation due to high risk of bleeding.  Chronic diastolic congestive heart failure (HCC) SNF - Continue low-dose Lasix, can change to when necessary if oral intake remains in adequate. Does not appear to be hyper/hypovolemic at present  GERD (gastroesophageal reflux disease) SNF - not stated as uncontrolled ;cont ptotonix 40 mg daily  Hypothyroid SNF - controlled ;cont synthroid 125 mcg daily  Protein-calorie malnutrition, severe SNF - Continue nutritional supplementation  Dysphagia Speech therapy recommends dysphagia type 3 diet based on MBS Patient is at high risk for developing recurrent aspiration  pneumonia   Time spent > 45 min;> 50% of time with patient was spent reviewing records, labs, tests and studies, counseling and developing plan of care  Hennie Duos, MD

## 2015-06-28 DIAGNOSIS — R1312 Dysphagia, oropharyngeal phase: Secondary | ICD-10-CM | POA: Diagnosis not present

## 2015-06-28 DIAGNOSIS — R2689 Other abnormalities of gait and mobility: Secondary | ICD-10-CM | POA: Diagnosis not present

## 2015-06-28 DIAGNOSIS — M6281 Muscle weakness (generalized): Secondary | ICD-10-CM | POA: Diagnosis not present

## 2015-06-28 DIAGNOSIS — J690 Pneumonitis due to inhalation of food and vomit: Secondary | ICD-10-CM | POA: Diagnosis not present

## 2015-06-29 DIAGNOSIS — J690 Pneumonitis due to inhalation of food and vomit: Secondary | ICD-10-CM | POA: Diagnosis not present

## 2015-06-29 DIAGNOSIS — R2689 Other abnormalities of gait and mobility: Secondary | ICD-10-CM | POA: Diagnosis not present

## 2015-06-29 DIAGNOSIS — R1312 Dysphagia, oropharyngeal phase: Secondary | ICD-10-CM | POA: Diagnosis not present

## 2015-06-29 DIAGNOSIS — M6281 Muscle weakness (generalized): Secondary | ICD-10-CM | POA: Diagnosis not present

## 2015-06-30 DIAGNOSIS — R2689 Other abnormalities of gait and mobility: Secondary | ICD-10-CM | POA: Diagnosis not present

## 2015-06-30 DIAGNOSIS — M6281 Muscle weakness (generalized): Secondary | ICD-10-CM | POA: Diagnosis not present

## 2015-06-30 DIAGNOSIS — R1312 Dysphagia, oropharyngeal phase: Secondary | ICD-10-CM | POA: Diagnosis not present

## 2015-06-30 DIAGNOSIS — J690 Pneumonitis due to inhalation of food and vomit: Secondary | ICD-10-CM | POA: Diagnosis not present

## 2015-07-01 DIAGNOSIS — R2689 Other abnormalities of gait and mobility: Secondary | ICD-10-CM | POA: Diagnosis not present

## 2015-07-01 DIAGNOSIS — J690 Pneumonitis due to inhalation of food and vomit: Secondary | ICD-10-CM | POA: Diagnosis not present

## 2015-07-01 DIAGNOSIS — M6281 Muscle weakness (generalized): Secondary | ICD-10-CM | POA: Diagnosis not present

## 2015-07-01 DIAGNOSIS — R1312 Dysphagia, oropharyngeal phase: Secondary | ICD-10-CM | POA: Diagnosis not present

## 2015-07-02 DIAGNOSIS — M6281 Muscle weakness (generalized): Secondary | ICD-10-CM | POA: Diagnosis not present

## 2015-07-02 DIAGNOSIS — J690 Pneumonitis due to inhalation of food and vomit: Secondary | ICD-10-CM | POA: Diagnosis not present

## 2015-07-02 DIAGNOSIS — R2689 Other abnormalities of gait and mobility: Secondary | ICD-10-CM | POA: Diagnosis not present

## 2015-07-02 DIAGNOSIS — R1312 Dysphagia, oropharyngeal phase: Secondary | ICD-10-CM | POA: Diagnosis not present

## 2015-07-03 DIAGNOSIS — J690 Pneumonitis due to inhalation of food and vomit: Secondary | ICD-10-CM | POA: Diagnosis not present

## 2015-07-03 DIAGNOSIS — R1312 Dysphagia, oropharyngeal phase: Secondary | ICD-10-CM | POA: Diagnosis not present

## 2015-07-03 DIAGNOSIS — M6281 Muscle weakness (generalized): Secondary | ICD-10-CM | POA: Diagnosis not present

## 2015-07-03 DIAGNOSIS — R2689 Other abnormalities of gait and mobility: Secondary | ICD-10-CM | POA: Diagnosis not present

## 2015-07-04 ENCOUNTER — Encounter: Payer: Self-pay | Admitting: Internal Medicine

## 2015-07-04 DIAGNOSIS — R1312 Dysphagia, oropharyngeal phase: Secondary | ICD-10-CM | POA: Diagnosis not present

## 2015-07-04 DIAGNOSIS — K219 Gastro-esophageal reflux disease without esophagitis: Secondary | ICD-10-CM | POA: Insufficient documentation

## 2015-07-04 DIAGNOSIS — M6281 Muscle weakness (generalized): Secondary | ICD-10-CM | POA: Diagnosis not present

## 2015-07-04 DIAGNOSIS — J690 Pneumonitis due to inhalation of food and vomit: Secondary | ICD-10-CM | POA: Diagnosis not present

## 2015-07-04 DIAGNOSIS — R1310 Dysphagia, unspecified: Secondary | ICD-10-CM | POA: Insufficient documentation

## 2015-07-04 DIAGNOSIS — R2689 Other abnormalities of gait and mobility: Secondary | ICD-10-CM | POA: Diagnosis not present

## 2015-07-04 NOTE — Assessment & Plan Note (Signed)
SNF - Continue nutritional supplementation

## 2015-07-04 NOTE — Assessment & Plan Note (Signed)
Speech therapy recommends dysphagia type 3 diet based on MBS Patient is at high risk for developing recurrent aspiration pneumonia

## 2015-07-04 NOTE — Assessment & Plan Note (Signed)
SNF - Continue amiodarone and Plavix. Not on any anticoagulation due to high risk of bleeding.

## 2015-07-04 NOTE — Assessment & Plan Note (Addendum)
Right basilar infiltrate, suspected aspiration pneumonia Patient presented with numbness of fatigue and shortness of breath due to recent hospitalization patient was started on Vancomycin and Zosyn. No blood cultures were obtained on the patient urine did have some evidence of pyuria but no evidence of urinary symptoms he had in the hospital. currently the patient will be transitioned to [facility]. Speech therapy was consulted and patient underwent modified barium swallow test. Diet recommendation per speech therapy dysphagia type III SNF - will cont levaquin q 48 for 7 days

## 2015-07-04 NOTE — Assessment & Plan Note (Signed)
SNF - controlled ;cont synthroid 125 mcg daily

## 2015-07-04 NOTE — Assessment & Plan Note (Signed)
SNF - not stated as uncontrolled ;cont ptotonix 40 mg daily

## 2015-07-04 NOTE — Assessment & Plan Note (Signed)
SNF - Continue low-dose Lasix, can change to when necessary if oral intake remains in adequate. Does not appear to be hyper/hypovolemic at present

## 2015-07-05 DIAGNOSIS — J690 Pneumonitis due to inhalation of food and vomit: Secondary | ICD-10-CM | POA: Diagnosis not present

## 2015-07-05 DIAGNOSIS — M6281 Muscle weakness (generalized): Secondary | ICD-10-CM | POA: Diagnosis not present

## 2015-07-05 DIAGNOSIS — R1312 Dysphagia, oropharyngeal phase: Secondary | ICD-10-CM | POA: Diagnosis not present

## 2015-07-05 DIAGNOSIS — R2689 Other abnormalities of gait and mobility: Secondary | ICD-10-CM | POA: Diagnosis not present

## 2015-07-06 DIAGNOSIS — M6281 Muscle weakness (generalized): Secondary | ICD-10-CM | POA: Diagnosis not present

## 2015-07-06 DIAGNOSIS — J690 Pneumonitis due to inhalation of food and vomit: Secondary | ICD-10-CM | POA: Diagnosis not present

## 2015-07-06 DIAGNOSIS — R1312 Dysphagia, oropharyngeal phase: Secondary | ICD-10-CM | POA: Diagnosis not present

## 2015-07-06 DIAGNOSIS — R2689 Other abnormalities of gait and mobility: Secondary | ICD-10-CM | POA: Diagnosis not present

## 2015-07-07 DIAGNOSIS — M6281 Muscle weakness (generalized): Secondary | ICD-10-CM | POA: Diagnosis not present

## 2015-07-07 DIAGNOSIS — R1312 Dysphagia, oropharyngeal phase: Secondary | ICD-10-CM | POA: Diagnosis not present

## 2015-07-07 DIAGNOSIS — J690 Pneumonitis due to inhalation of food and vomit: Secondary | ICD-10-CM | POA: Diagnosis not present

## 2015-07-07 DIAGNOSIS — R2689 Other abnormalities of gait and mobility: Secondary | ICD-10-CM | POA: Diagnosis not present

## 2015-07-08 DIAGNOSIS — R1312 Dysphagia, oropharyngeal phase: Secondary | ICD-10-CM | POA: Diagnosis not present

## 2015-07-08 DIAGNOSIS — R2689 Other abnormalities of gait and mobility: Secondary | ICD-10-CM | POA: Diagnosis not present

## 2015-07-08 DIAGNOSIS — J690 Pneumonitis due to inhalation of food and vomit: Secondary | ICD-10-CM | POA: Diagnosis not present

## 2015-07-08 DIAGNOSIS — M6281 Muscle weakness (generalized): Secondary | ICD-10-CM | POA: Diagnosis not present

## 2015-07-09 DIAGNOSIS — R2689 Other abnormalities of gait and mobility: Secondary | ICD-10-CM | POA: Diagnosis not present

## 2015-07-09 DIAGNOSIS — M6281 Muscle weakness (generalized): Secondary | ICD-10-CM | POA: Diagnosis not present

## 2015-07-09 DIAGNOSIS — J690 Pneumonitis due to inhalation of food and vomit: Secondary | ICD-10-CM | POA: Diagnosis not present

## 2015-07-09 DIAGNOSIS — Z66 Do not resuscitate: Secondary | ICD-10-CM | POA: Insufficient documentation

## 2015-07-09 DIAGNOSIS — R1312 Dysphagia, oropharyngeal phase: Secondary | ICD-10-CM | POA: Diagnosis not present

## 2015-07-09 DIAGNOSIS — R531 Weakness: Secondary | ICD-10-CM | POA: Insufficient documentation

## 2015-07-10 DIAGNOSIS — M6281 Muscle weakness (generalized): Secondary | ICD-10-CM | POA: Diagnosis not present

## 2015-07-10 DIAGNOSIS — J690 Pneumonitis due to inhalation of food and vomit: Secondary | ICD-10-CM | POA: Diagnosis not present

## 2015-07-10 DIAGNOSIS — R1312 Dysphagia, oropharyngeal phase: Secondary | ICD-10-CM | POA: Diagnosis not present

## 2015-07-10 DIAGNOSIS — R2689 Other abnormalities of gait and mobility: Secondary | ICD-10-CM | POA: Diagnosis not present

## 2015-07-11 DIAGNOSIS — R1312 Dysphagia, oropharyngeal phase: Secondary | ICD-10-CM | POA: Diagnosis not present

## 2015-07-11 DIAGNOSIS — M6281 Muscle weakness (generalized): Secondary | ICD-10-CM | POA: Diagnosis not present

## 2015-07-11 DIAGNOSIS — J690 Pneumonitis due to inhalation of food and vomit: Secondary | ICD-10-CM | POA: Diagnosis not present

## 2015-07-11 DIAGNOSIS — R2689 Other abnormalities of gait and mobility: Secondary | ICD-10-CM | POA: Diagnosis not present

## 2015-07-12 DIAGNOSIS — R1312 Dysphagia, oropharyngeal phase: Secondary | ICD-10-CM | POA: Diagnosis not present

## 2015-07-12 DIAGNOSIS — R2689 Other abnormalities of gait and mobility: Secondary | ICD-10-CM | POA: Diagnosis not present

## 2015-07-12 DIAGNOSIS — J690 Pneumonitis due to inhalation of food and vomit: Secondary | ICD-10-CM | POA: Diagnosis not present

## 2015-07-12 DIAGNOSIS — M6281 Muscle weakness (generalized): Secondary | ICD-10-CM | POA: Diagnosis not present

## 2015-07-13 DIAGNOSIS — R2689 Other abnormalities of gait and mobility: Secondary | ICD-10-CM | POA: Diagnosis not present

## 2015-07-13 DIAGNOSIS — R1312 Dysphagia, oropharyngeal phase: Secondary | ICD-10-CM | POA: Diagnosis not present

## 2015-07-13 DIAGNOSIS — M6281 Muscle weakness (generalized): Secondary | ICD-10-CM | POA: Diagnosis not present

## 2015-07-13 DIAGNOSIS — J690 Pneumonitis due to inhalation of food and vomit: Secondary | ICD-10-CM | POA: Diagnosis not present

## 2015-07-14 DIAGNOSIS — J690 Pneumonitis due to inhalation of food and vomit: Secondary | ICD-10-CM | POA: Diagnosis not present

## 2015-07-14 DIAGNOSIS — R1312 Dysphagia, oropharyngeal phase: Secondary | ICD-10-CM | POA: Diagnosis not present

## 2015-07-14 DIAGNOSIS — R2689 Other abnormalities of gait and mobility: Secondary | ICD-10-CM | POA: Diagnosis not present

## 2015-07-14 DIAGNOSIS — M6281 Muscle weakness (generalized): Secondary | ICD-10-CM | POA: Diagnosis not present

## 2015-08-01 ENCOUNTER — Encounter: Payer: Self-pay | Admitting: Internal Medicine

## 2015-08-01 ENCOUNTER — Non-Acute Institutional Stay (SKILLED_NURSING_FACILITY): Payer: Medicare Other | Admitting: Internal Medicine

## 2015-08-01 DIAGNOSIS — J189 Pneumonia, unspecified organism: Secondary | ICD-10-CM | POA: Diagnosis not present

## 2015-08-01 DIAGNOSIS — K219 Gastro-esophageal reflux disease without esophagitis: Secondary | ICD-10-CM | POA: Diagnosis not present

## 2015-08-01 DIAGNOSIS — J181 Lobar pneumonia, unspecified organism: Secondary | ICD-10-CM

## 2015-08-01 DIAGNOSIS — I110 Hypertensive heart disease with heart failure: Secondary | ICD-10-CM

## 2015-08-01 NOTE — Progress Notes (Signed)
MRN: [id number] Name: [name]  Sex: female Age: 82 y.o. DOB: [birth year]  [place] #: [facility] Facility/Room:207 W Level Of Care: SNF Provider: [name], MD Emergency Contacts: Extended Emergency Contact Information Primary Emergency Contact: [name],[name] Address: [address]          [place], [place] [address] [name] of [place] Phone: [phone] Mobile Phone: [phone] Relation: Daughter Secondary Emergency Contact: [name] Address: [place], [place] [address] [name] of [place] Phone: [phone] Work Phone: [phone] Mobile Phone: [phone] Relation: Daughter  Code Status: DNR  Allergies: Nubain; Mirtazapine; Pineapple; Statins; and Iodine  Chief Complaint  Patient presents with  . Medical Management of Chronic Issues    Routine Visit    HPI: Patient is 82 y.o. female whCAD, PVD, Afib, SSS on PPM, TIA, COPD, hypothyroidism, CKD, hernia who came with cc of cough and fatigue for 3 weeks. Pt was admitted to [facility] where she was treated for aspiration PNA. Pt was admitted to SNF with generalized weakness for OT/PT. Pt is being seen for routine issues of HTN, PNA, and GERD.   Past Medical History  Diagnosis Date  . GI bleed 2007  . Hypertension   . TIA (transient ischemic attack)   . Hypothyroidism   . Abdominal aortic aneurysm ([place])   . CAD (coronary artery disease)   . SSS (sick sinus syndrome) ([place]) 10/05/2007    Medtronic Adapta  . Myocardial infarction ([place])   . Atrial fibrillation ([facility])   . COPD (chronic obstructive pulmonary disease) ([place])   . Asthma     hx  . Anemia     hx  . Blood transfusion   . UTI (lower urinary tract infection)     hx  . Renal failure, chronic   . H/O hiatal hernia   . GERD (gastroesophageal reflux disease)   . Stroke ([place])   . Osteoporosis   . Blood transfusion without reported diagnosis   . Pacemaker     Past Surgical History  Procedure Laterality Date   . Pacemaker insertion  10/05/2007    Medtronic Adapta  . Coronary stent placement      x9  . Cardiac catheterization    . Fracture surgery      femur fx, /w ORIF into HIP- 2008  . Total hip arthroplasty      planned for 08/27/2011 Left  . Total hip arthroplasty  08/27/2011    Procedure: TOTAL HIP ARTHROPLASTY;  Surgeon: Kerin Salen, MD;  Location: [facility];  Service: Orthopedics;  Laterality: Right;  . US echocardiography  08/23/2011    EF 50%,LA mod. dilated,mild to mod. mitral annular ca+,mod. MR,mild to mod TR,mild to mod. PH,trace AI.  [name] Nm myocar perf wall motion  10/08/2010    mild apical anterior ischemia  . Colonoscopy        Medication List       This list is accurate as of: 08/01/15 11:59 PM.  Always use your most recent med list.               acetaminophen 325 MG tablet  Commonly known as:  TYLENOL  Take 2 tablets (650 mg total) by mouth every 6 (six) hours as needed for mild pain (or Fever >/= 101).     amiodarone 200 MG tablet  Commonly known as:  PACERONE  TAKE 1 TABLET BY MOUTH EVERY DAY     Biotin 1000 MCG  tablet  Take 1,000 mcg by mouth daily.     CALCIUM 600+D 600-400 MG-UNIT tablet  Generic drug:  Calcium Carbonate-Vitamin D  Take 1 tablet by mouth daily.     clopidogrel 75 MG tablet  Commonly known as:  PLAVIX  TAKE 1 TABLET (75 MG TOTAL) BY MOUTH DAILY.     feeding supplement Liqd  Take 237 mLs by mouth daily.     ferrous sulfate 325 (65 FE) MG tablet  Take 325 mg by mouth daily.     furosemide 20 MG tablet  Commonly known as:  LASIX  Take 1 tablet (20 mg total) by mouth daily.     levothyroxine 125 MCG tablet  Commonly known as:  SYNTHROID, LEVOTHROID  Take 125 mcg by mouth daily before breakfast.     nitroGLYCERIN 0.4 MG SL tablet  Commonly known as:  NITROSTAT  Place 1 tablet (0.4 mg total) under the tongue every 5 (five) minutes as needed for chest pain (For a total of 3 tablets, if chest pain persist to call 911).     OXYGEN   Inhale into the lungs as needed (to keep O2 sat above 90%).     pantoprazole 40 MG tablet  Commonly known as:  PROTONIX  TAKE 1 TABLET (40 MG TOTAL) BY MOUTH DAILY.     potassium chloride 10 MEQ tablet  Commonly known as:  K-DUR  Take 10 mEq by mouth 2 (two) times daily with a meal. 9am, 6pm     promethazine 25 MG tablet  Commonly known as:  PHENERGAN  Take 25 mg by mouth every 6 (six) hours as needed for nausea or vomiting.     rOPINIRole 0.25 MG tablet  Commonly known as:  REQUIP  Take 0.25 mg by mouth at bedtime.     vitamin B-12 1000 MCG tablet  Commonly known as:  CYANOCOBALAMIN  Take 1,000 mcg by mouth daily.        Meds ordered this encounter  Medications  . levothyroxine (SYNTHROID, LEVOTHROID) 125 MCG tablet    Sig: Take 125 mcg by mouth daily before breakfast.    Immunization History  Administered Date(s) Administered  . Influenza,inj,Quad PF,36+ Mos 12/18/2013, 12/09/2014  . PPD Test 02/02/2015, 02/27/2015  . Pneumococcal Conjugate-13 12/09/2014    Social History  Substance Use Topics  . Smoking status: Former Smoker    Types: Cigarettes  . Smokeless tobacco: Never Used     Comment: quit 40 yrs ago- was smoking 2 packs a day at the most  . Alcohol Use: No    Review of Systems  DATA OBTAINED: from patient, nurse GENERAL:  no fevers, fatigue, appetite changes SKIN: No itching, rash HEENT: No complaint RESPIRATORY: No cough, wheezing, SOB CARDIAC: No chest pain, palpitations, lower extremity edema  GI: No abdominal pain, No N/V/D or constipation, No heartburn or reflux  GU: No dysuria, frequency or urgency, or incontinence  MUSCULOSKELETAL: No unrelieved bone/joint pain NEUROLOGIC: No headache, dizziness  PSYCHIATRIC: No overt anxiety or sadness  Filed Vitals:   08/01/15 1332  BP: 110/75  Pulse: 71  Temp: 96.9 F (36.1 C)  Resp: 16    Physical Exam  GENERAL APPEARANCE: Alert, conversant, No acute distress , walkng down the hall SKIN:  No diaphoresis rash HEENT: Unremarkable RESPIRATORY: Breathing is even, unlabored. Lung sounds are clear   CARDIOVASCULAR: Heart RRR no murmurs, rubs or gallops. No peripheral edema  GASTROINTESTINAL: Abdomen is soft, non-tender, not distended w/ normal bowel sounds.  GENITOURINARY: Bladder  non tender, not distended  MUSCULOSKELETAL: No abnormal joints or musculature NEUROLOGIC: Cranial nerves 2-12 grossly intact. Moves all extremities PSYCHIATRIC: Mood and affect appropriate to situation, no behavioral issues  Patient Active Problem List   Diagnosis Date Noted  . DNR (do not resuscitate)   . Weakness generalized   . GERD (gastroesophageal reflux disease) 07/04/2015  . Dysphagia 07/04/2015  . Transaminitis 06/24/2015  . HCAP (healthcare-associated pneumonia) 06/22/2015  . Pneumonia 06/22/2015  . COPD exacerbation ([place]) 06/21/2015  . Cough 06/09/2015  . Fever, unspecified 06/09/2015  . Chronic diastolic congestive heart failure ([place]) 04/24/2015  . Restless legs 04/24/2015  . Restless leg 04/24/2015  . Pain and swelling of left lower extremity 02/15/2015  . Candidiasis of perineum 02/15/2015  . Hemorrhoids 02/15/2015  . CKD (chronic kidney disease) stage 3, GFR 30-59 ml/min 02/06/2015  . Acute respiratory failure with hypoxia ([place]) 01/29/2015  . Acute pulmonary edema ([place]) 01/29/2015  . SOB (shortness of breath)   . Palliative care encounter   . Sepsis ([place]) 01/26/2015  . Cellulitis of leg, left 01/26/2015  . Protein-calorie malnutrition, severe ([place]) 11/06/2014  . Pressure ulcer 11/06/2014  . Acute diastolic CHF (congestive heart failure) ([place]) 11/04/2014  . Weakness 11/04/2014  . Hypertensive heart disease with CHF (congestive heart failure) ([place]) 11/04/2014  . Macrocytic anemia 11/04/2014  . Cardiomyopathy, ischemic 11/04/2014  . CHF exacerbation ([place]) 11/03/2014  . Body mass index (BMI) of 20.0-20.9 in adult 06/23/2014  . Hard of hearing 06/23/2014  . Rectal bleeding  07/16/2013  . Hyperlipidemia, statin intolerance 04/30/2013  . Paroxysmal atrial fibrillation ([place]) 04/30/2013  . Pacemaker - dual chamber Medtronic 2009 04/30/2013  . AAA (abdominal aortic aneurysm) ([place]) 04/30/2013  . Bilateral carotid artery stenosis 04/30/2013  . PAD (peripheral artery disease) ([place]) 04/30/2013  . Constipation 08/31/2011  . Hypotension 08/28/2011  . S/P total hip arthroplasty 08/28/2011  . CAD (coronary artery disease) 08/28/2011  . Hypothyroid 08/28/2011  . Klebsiella cystitis 08/28/2011  . Thrombocytopenia ([place]) 08/28/2011  . SSS (sick sinus syndrome) ([facility]) 10/05/2007    CBC    Component Value Date/Time   WBC 5.8 06/24/2015 0648   WBC 7.9 06/19/2015   RBC 3.73* 06/24/2015 0648   RBC 3.74* 08/01/2014 1401   HGB 11.1* 06/24/2015 0648   HCT 36.5 06/24/2015 0648   HCT 38.0 08/01/2014 1401   PLT 185 06/24/2015 0648   PLT 146* 08/01/2014 1401   MCV 97.9 06/24/2015 0648   MCV 102* 08/01/2014 1401   LYMPHSABS 1.2 06/23/2015 0352   LYMPHSABS 1.4 08/01/2014 1401   MONOABS 0.6 06/23/2015 0352   EOSABS 0.1 06/23/2015 0352   EOSABS 0.1 08/01/2014 1401   BASOSABS 0.0 06/23/2015 0352   BASOSABS 0.0 08/01/2014 1401    CMP     Component Value Date/Time   NA 141 06/26/2015 0540   NA 138 06/19/2015   K 4.4 06/26/2015 0540   CL 106 06/26/2015 0540   CO2 27 06/26/2015 0540   GLUCOSE 107* 06/26/2015 0540   GLUCOSE 88 08/01/2014 1401   BUN 12 06/26/2015 0540   BUN 32* 06/19/2015   CREATININE 1.17* 06/26/2015 0540   CREATININE 1.5* 06/19/2015   CREATININE 1.16* 01/22/2015 1203   CALCIUM 8.5* 06/26/2015 0540   PROT 4.9* 06/24/2015 0648   PROT 6.0 08/01/2014 1401   ALBUMIN 2.1* 06/26/2015 0540   ALBUMIN 3.9 08/01/2014 1401   AST 88* 06/24/2015 0648   ALT 57* 06/24/2015 0648   ALKPHOS 73 06/24/2015 0648   BILITOT 0.7 06/24/2015  [id number]   BILITOT 0.3 08/01/2014 1401   GFRNONAA 38* 06/26/2015 0540   GFRAA 44* 06/26/2015 0540    Assessment and  Plan  Hypertensive heart disease with CHF (congestive heart failure) (HCC) Controlled on lasix only 20 mg daily; Plan - cont currtent med; monitor BP  Pneumonia Resolved; pt walking in hallway , no O2; no recurrent episodes; will monitor  GERD (gastroesophageal reflux disease) No reported problems; cont protonix 40 mg daily    Jaclyn Carew D. Sheppard Coil, MD

## 2015-08-02 NOTE — Assessment & Plan Note (Signed)
Controlled on lasix only 20 mg daily; Plan - cont currtent med; monitor BP

## 2015-08-02 NOTE — Assessment & Plan Note (Signed)
No reported problems; cont protonix 40 mg daily

## 2015-08-02 NOTE — Assessment & Plan Note (Signed)
Resolved; pt walking in hallway , no O2; no recurrent episodes; will monitor

## 2015-08-18 DIAGNOSIS — I2510 Atherosclerotic heart disease of native coronary artery without angina pectoris: Secondary | ICD-10-CM | POA: Diagnosis not present

## 2015-08-18 DIAGNOSIS — M6281 Muscle weakness (generalized): Secondary | ICD-10-CM | POA: Diagnosis not present

## 2015-08-19 ENCOUNTER — Ambulatory Visit (INDEPENDENT_AMBULATORY_CARE_PROVIDER_SITE_OTHER): Payer: Medicare Other | Admitting: Cardiovascular Disease

## 2015-08-19 VITALS — BP 121/72 | HR 88 | Ht 61.0 in | Wt 106.0 lb

## 2015-08-19 DIAGNOSIS — I25118 Atherosclerotic heart disease of native coronary artery with other forms of angina pectoris: Secondary | ICD-10-CM | POA: Diagnosis not present

## 2015-08-19 DIAGNOSIS — I714 Abdominal aortic aneurysm, without rupture, unspecified: Secondary | ICD-10-CM

## 2015-08-19 DIAGNOSIS — I255 Ischemic cardiomyopathy: Secondary | ICD-10-CM | POA: Diagnosis not present

## 2015-08-19 DIAGNOSIS — I480 Paroxysmal atrial fibrillation: Secondary | ICD-10-CM

## 2015-08-19 DIAGNOSIS — Z950 Presence of cardiac pacemaker: Secondary | ICD-10-CM

## 2015-08-19 DIAGNOSIS — I5032 Chronic diastolic (congestive) heart failure: Secondary | ICD-10-CM | POA: Diagnosis not present

## 2015-08-19 DIAGNOSIS — I110 Hypertensive heart disease with heart failure: Secondary | ICD-10-CM

## 2015-08-19 DIAGNOSIS — I6523 Occlusion and stenosis of bilateral carotid arteries: Secondary | ICD-10-CM

## 2015-08-19 DIAGNOSIS — I495 Sick sinus syndrome: Secondary | ICD-10-CM

## 2015-08-19 LAB — CUP PACEART INCLINIC DEVICE CHECK
Implantable Lead Implant Date: 20090723
Implantable Lead Location: 753859
Implantable Lead Model: 5076
Lead Channel Setting Pacing Amplitude: 2 V
Lead Channel Setting Pacing Pulse Width: 0.4 ms
Lead Channel Setting Sensing Sensitivity: 5.6 mV
MDC IDC LEAD IMPLANT DT: 20090723
MDC IDC LEAD LOCATION: 753860
MDC IDC SESS DTM: 20170606122617
MDC IDC SET LEADCHNL RA PACING AMPLITUDE: 1.5 V

## 2015-08-19 NOTE — Patient Instructions (Signed)
Dr Sallyanne Kuster recommends that you continue on your current medications as directed. Please refer to the Current Medication list given to you today.  Remote monitoring is used to monitor your Pacemaker of ICD from home. This monitoring reduces the number of office visits required to check your device to one time per year. It allows [place] to keep an eye on the functioning of your device to ensure it is working properly. You are scheduled for a device check from home on Tuesday, September 5th, 2017. You may send your transmission at any time that day. If you have a wireless device, the transmission will be sent automatically. After your physician reviews your transmission, you will receive a postcard with your next transmission date.   Dr Sallyanne Kuster recommends that you schedule a follow-up appointment in 6 months with a pacemaker check. You will receive a reminder letter in the mail two months in advance. If you don't receive a letter, please call our office to schedule the follow-up appointment.  If you need a refill on your cardiac medications before your next appointment, please call your pharmacy.

## 2015-08-19 NOTE — Progress Notes (Signed)
Patient ID: [name], female   DOB: 09-11-1917, 82 y.o.   MRN: [id number]    Cardiology Office Note    Date:  08/21/2015   ID:  [name], DOB 1917-12-18, MRN [id number]  PCP:  No PCP Per Patient  Cardiologist:   Sanda Klein, MD   Chief Complaint  Patient presents with  . Follow-up    Patient has no complaints.    History of Present Illness:  [name] is a 82 y.o. female with coronary artery disease, history of paroxysmal atrial fibrillation, previous TIA, sinus node dysfunction and a dual-chamber pacemaker, diastolic heart failure, COPD, hypertension and hypothyroidism returning for follow-up.  She was hospitalized in April with pneumonia. During that time she had a lengthy episode of persistent atrial fibrillation for about 2 weeks. Today she is back in atrial paced rhythm she is recovering so very slowly from the pneumonia. She has lost weight and her appetite remains rather poor. She is in a nursing home.  Interrogation of her device shows normal function. Presenting rhythm was atrial paced ventricular sensed. There is 97% atrial pacing but only 2.7% ventricular pacing. The lengthy episode of atrial fibrillation during her pneumonia is the only episode of mode switch in the last year. Her pacemaker generator is likely to reach elective replacement interval in the next 6 months or so.  CAD: In 1997 she underwent urgent stenting of the proximal LAD artery for NSTEMI and then underwent a staged stent to the left circumflex coronary artery, complicated by frequent nonsustained ventricular tachycardia. A planned staged intervention to the right coronary artery was performed later the same year.  She required repeat stenting of all 3 coronary arteries in 2006, when she presented with unstable angina. These procedures were performed on 3 consecutive days and a rather complicated fashion. Drug-eluting stents were placed in the proximal LAD and proximal RCA, with bare-metal stents in  the proximal left circumflex and mid LAD. There was residual 50% stenosis of the left main coronary artery, but she has not required any revascularization procedure since.  Her last nuclear stress test performed in 2012 and showed low risk findings.   CHF: she bears a diagnosis of diastolic heart failure and takes a very low dose of loop diuretic. Attempts to discontinue her furosemide completely led to marked edema and it was restarted.  PAD: She has an infrarenal abdominal aortic aneurysm has been recognized for 18 years without much progression. Most recent ultrasound in May of 2015 measured at 3.8x3.8 cm. Has received a previous stent to the right common iliac artery and has mild bilateral stenoses in both iliac arteries by her most recent ultrasound performed in 2014. She does not have intermittent claudication. Bilateral ABIs were greater than 1.0. Stable <50% bilateral internal carotid artery lesions, also unchanged for several years.   Arrhythmia: She has a history of sinus node dysfunction and paroxysmal atrial fibrillation and received a dual-chamber Medtronic adapta pacemaker in 2009. She had 2 weeks of persistent atrial fibrillation in April 2017 during an episode of pneumonia.. Atrial pacing occurs virtually 100% of the time. Ventricular pacing occurs very infrequently. She failed multaq antiarrhythmic therapy and is now receiving amiodarone in a low dose.   Risk factors: She has been intolerant to numerous statins including pravastatin, atorvastatin, simvastatin and daily or weekly Crestor.   Significant comorbidities also include treated hypothyroidism. She does not have diabetes mellitus and quit smoking decades ago. She has had multiple repeat surgeries for her left  hip with the most recent revision being in June of 2013. She uses a walker and occasionally gets around with a cane.   Past Medical History  Diagnosis Date  . GI bleed 2007  . Hypertension   . TIA (transient  ischemic attack)   . Hypothyroidism   . Abdominal aortic aneurysm ([place])   . CAD (coronary artery disease)   . SSS (sick sinus syndrome) ([place]) 10/05/2007    Medtronic Adapta  . Myocardial infarction ([place])   . Atrial fibrillation ([place])   . COPD (chronic obstructive pulmonary disease) ([place])   . Asthma     hx  . Anemia     hx  . Blood transfusion   . UTI (lower urinary tract infection)     hx  . Renal failure, chronic   . H/O hiatal hernia   . GERD (gastroesophageal reflux disease)   . Stroke ([place])   . Osteoporosis   . Blood transfusion without reported diagnosis   . Pacemaker     Past Surgical History  Procedure Laterality Date  . Pacemaker insertion  10/05/2007    Medtronic Adapta  . Coronary stent placement      x9  . Cardiac catheterization    . Fracture surgery      femur fx, /w ORIF into HIP- 2008  . Total hip arthroplasty      planned for 08/27/2011 Left  . Total hip arthroplasty  08/27/2011    Procedure: TOTAL HIP ARTHROPLASTY;  Surgeon: Kerin Salen, MD;  Location: [facility];  Service: Orthopedics;  Laterality: Right;  . US echocardiography  08/23/2011    EF 50%,LA mod. dilated,mild to mod. mitral annular ca+,mod. MR,mild to mod TR,mild to mod. PH,trace AI.  [name] Nm myocar perf wall motion  10/08/2010    mild apical anterior ischemia  . Colonoscopy      Current Medications: Outpatient Prescriptions Prior to Visit  Medication Sig Dispense Refill  . acetaminophen (TYLENOL) 325 MG tablet Take 2 tablets (650 mg total) by mouth every 6 (six) hours as needed for mild pain (or Fever >/= 101).    [name] amiodarone (PACERONE) 200 MG tablet TAKE 1 TABLET BY MOUTH EVERY DAY 30 tablet 4  . Biotin 1000 MCG tablet Take 1,000 mcg by mouth daily.    . Calcium Carbonate-Vitamin D (CALCIUM 600+D) 600-400 MG-UNIT tablet Take 1 tablet by mouth daily.    . clopidogrel (PLAVIX) 75 MG tablet TAKE 1 TABLET (75 MG TOTAL) BY MOUTH DAILY. 90 tablet 1  . feeding supplement (BOOST HIGH PROTEIN) LIQD Take  237 mLs by mouth daily. 14 Can 0  . ferrous sulfate 325 (65 FE) MG tablet Take 325 mg by mouth daily.     . furosemide (LASIX) 20 MG tablet Take 1 tablet (20 mg total) by mouth daily. 30 tablet 3  . levothyroxine (SYNTHROID, LEVOTHROID) 125 MCG tablet Take 125 mcg by mouth daily before breakfast.    . nitroGLYCERIN (NITROSTAT) 0.4 MG SL tablet Place 1 tablet (0.4 mg total) under the tongue every 5 (five) minutes as needed for chest pain (For a total of 3 tablets, if chest pain persist to call 911). 50 tablet 3  . OXYGEN Inhale into the lungs as needed (to keep O2 sat above 90%).    . pantoprazole (PROTONIX) 40 MG tablet TAKE 1 TABLET (40 MG TOTAL) BY MOUTH DAILY. 30 tablet 3  . potassium chloride (K-DUR) 10 MEQ tablet Take 10 mEq by mouth 2 (two) times daily with a  meal. 9am, 6pm    . promethazine (PHENERGAN) 25 MG tablet Take 25 mg by mouth every 6 (six) hours as needed for nausea or vomiting.     [name] rOPINIRole (REQUIP) 0.25 MG tablet Take 0.25 mg by mouth at bedtime.    . vitamin B-12 (CYANOCOBALAMIN) 1000 MCG tablet Take 1,000 mcg by mouth daily.     No facility-administered medications prior to visit.     Allergies:   Nubain; Mirtazapine; Pineapple; Statins; and Iodine   Social History   Social History  . Marital Status: Widowed    Spouse Name: N/A  . Number of Children: 4  . Years of Education: N/A   Occupational History  . Retired    Social History Main Topics  . Smoking status: Former Smoker    Types: Cigarettes  . Smokeless tobacco: Never Used     Comment: quit 40 yrs ago- was smoking 2 packs a day at the most  . Alcohol Use: No  . Drug Use: No  . Sexual Activity: Yes    Birth Control/ Protection: Post-menopausal   Other Topics Concern  . Not on file   Social History Narrative   Diet:      Do you drink/ eat things with caffeine? yes      Marital status:    widow                           What year were you married ?  1940      Do you live in a house,  apartment,assistred living, condo, trailer, etc.)?  Mobile home      Is it one or more stories? 1      How many persons live in your home ?  1      Do you have any pets in your home ?(please list)  1 cat      Current or past profession: factory worker      Do you exercise?  yes                           Type & how often: small amount      Do you have a living will?  no      Do you have a DNR form?     no                  If not, do you want to discuss one?       Do you have signed POA?HPOA forms?  no               If so, please bring to your        appointment           Family History:  The patient's family history includes Atrial fibrillation in her daughter and daughter; Cancer in her brother, brother, sister, and sister; Diabetes in her daughter; Emphysema in her father; Heart Problems in her son; Heart disease in her mother; Stroke in her daughter.   ROS:   Please see the history of present illness.    ROS All other systems reviewed and are negative.   PHYSICAL EXAM:   VS:  BP 121/72 mmHg  Pulse 88  Ht 5\' 1"  (1.549 m)  Wt 48.081 kg (106 lb)  BMI 20.04 kg/m2   GEN: Cachectic, well developed, in no acute distress . Prominent thoracic kyphosis HEENT: normal Neck:  no JVD, carotid bruits, or masses Cardiac: RRR; no murmurs, rubs, or gallops,no edema , healthy appearing pacemaker site Respiratory:  Diminished breath sounds throughout but otherwise clear to auscultation bilaterally, normal work of breathing GI: soft, nontender, nondistended, + BS MS: no deformity or atrophy Skin: warm and dry, no rash Neuro:  Alert and Oriented x 3, Strength and sensation are intact Psych: euthymic mood, full affect  Wt Readings from Last 3 Encounters:  08/19/15 48.081 kg (106 lb)  08/01/15 48.444 kg (106 lb 12.8 oz)  06/22/15 48.807 kg (107 lb 9.6 oz)      Studies/Labs Reviewed:   EKG:  EKG is ordered today.  The ekg ordered today demonstrates Atrial paced, ventricular sensed  rhythm, poor R-wave progression, nonspecific intraventricular conduction delay with QRS 114 ms  Recent Labs: 01/30/2015: Magnesium 1.4* 03/10/2015: B Natriuretic Peptide 182.3* 04/16/2015: TSH 4.16 06/24/2015: ALT 57*; Hemoglobin 11.1*; Platelets 185 06/26/2015: BUN 12; Creatinine, Ser 1.17*; Potassium 4.4; Sodium 141   Lipid Panel    Component Value Date/Time   CHOL 182 12/01/2012 1035   TRIG 118 12/01/2012 1035   HDL 56 12/01/2012 1035   CHOLHDL 3.3 12/01/2012 1035   VLDL 24 12/01/2012 1035   LDLCALC 102* 12/01/2012 1035    Additional studies/ records that were reviewed today include:  Records from recent hospitalization  ASSESSMENT:    1. Chronic diastolic congestive heart failure ([place])   2. Coronary artery disease involving native coronary artery of native heart with other form of angina pectoris ([place])   3. SSS (sick sinus syndrome) ([facility])   4. Paroxysmal atrial fibrillation ([place])   5. Pacemaker   6. Abdominal aortic aneurysm (AAA) without rupture ([place])   7. Bilateral carotid artery stenosis   8. Hypertensive heart disease with CHF (congestive heart failure) ([place])      PLAN:  In order of problems listed above:  1. CHF: Tears clinically euvolemic on her very low dose of furosemide. No changes planned 2. CAD: Complicated history of multiple percutaneous revascularization procedures and residual stenosis of the left main coronary artery. Thankfully right now she is asymptomatic, unfortunately statin intolerant. Continue current medications 3. SSS: She is very sedentary. Current pacemaker settings appear to be appropriate 4. AFib: Recent lengthy episode of atrial fibrillation associated with pneumonia, otherwise she has had very good response to amiodarone with suppression of the arrhythmia for a long time. Normal thyroid function. Liver tests were mildly abnormal during her hospitalization but seemed to peak and were improving before discharge. CHADSVasc 8 (age 45, TIA 2, HTN,  CAD, GHF, gender). Her risk of embolic events is high , as is her risk of bleeding complications. This has previously been thought out and discussed with the patient and her family. She is on clopidogrel and is not felt to be a good candidate for warfarin or a novel anticoagulant.  5. PPM: Normal pacemaker function. Anticipate change out in the next several months. He is not pacemaker dependent. Remote download in 3 months and office visit in 6 months. 6. AAA (abdominal aortic aneurysm): Asymptomatic, stable in size at about 3.8 cm - Since the aneurysm is small and I doubt she will ever be a surgical or percutaneous repair candidate, routine monitoring does not appear indicated anymore. 7. Bilateral carotid artery stenosis: Mild. Asymptomatic. 8. HTN: well controlled    Medication Adjustments/Labs and Tests Ordered: Current medicines are reviewed at length with the patient today.  Concerns regarding medicines are outlined above.  Medication changes, Labs and Tests  ordered today are listed in the Patient Instructions below. Patient Instructions  Dr Sallyanne Kuster recommends that you continue on your current medications as directed. Please refer to the Current Medication list given to you today.  Remote monitoring is used to monitor your Pacemaker of ICD from home. This monitoring reduces the number of office visits required to check your device to one time per year. It allows [place] to keep an eye on the functioning of your device to ensure it is working properly. You are scheduled for a device check from home on Tuesday, September 5th, 2017. You may send your transmission at any time that day. If you have a wireless device, the transmission will be sent automatically. After your physician reviews your transmission, you will receive a postcard with your next transmission date.   Dr Sallyanne Kuster recommends that you schedule a follow-up appointment in 6 months with a pacemaker check. You will receive a reminder  letter in the mail two months in advance. If you don't receive a letter, please call our office to schedule the follow-up appointment.  If you need a refill on your cardiac medications before your next appointment, please call your pharmacy.    Signed, Sanda Klein, MD  08/21/2015 6:58 PM    [facility] [place], [place], [place]  [address] Phone: [phone]; Fax: [phone]

## 2015-08-21 ENCOUNTER — Encounter: Payer: Self-pay | Admitting: Cardiovascular Disease

## 2015-08-21 DIAGNOSIS — I714 Abdominal aortic aneurysm, without rupture, unspecified: Secondary | ICD-10-CM | POA: Insufficient documentation

## 2015-08-25 ENCOUNTER — Encounter: Payer: Self-pay | Admitting: Cardiovascular Disease

## 2015-10-01 ENCOUNTER — Encounter: Payer: Self-pay | Admitting: Internal Medicine

## 2015-10-01 ENCOUNTER — Non-Acute Institutional Stay (SKILLED_NURSING_FACILITY): Payer: Medicare Other | Admitting: Internal Medicine

## 2015-10-01 DIAGNOSIS — E034 Atrophy of thyroid (acquired): Secondary | ICD-10-CM

## 2015-10-01 DIAGNOSIS — E038 Other specified hypothyroidism: Secondary | ICD-10-CM

## 2015-10-01 DIAGNOSIS — I480 Paroxysmal atrial fibrillation: Secondary | ICD-10-CM | POA: Diagnosis not present

## 2015-10-01 DIAGNOSIS — I5032 Chronic diastolic (congestive) heart failure: Secondary | ICD-10-CM | POA: Diagnosis not present

## 2015-10-01 NOTE — Progress Notes (Signed)
MRN: [id number] Name: [name]  Sex: female Age: 82 y.o. DOB: 1917/03/18  [name] #:  Facility/Room:[name] / [address] Level Of Care: SNF Provider: Noah [name]. Brandy Coil, MD Emergency Contacts: Extended Emergency Contact Information Primary Emergency Contact: [name],[name] Address: [address]          [place], [place] [address] [name] of [name] Phone: [phone] Mobile Phone: [phone] Relation: Daughter Secondary Emergency Contact: [name] Address: [name], [name] [address] [name] of [place] Phone: [phone] Work Phone: [phone] Mobile Phone: [phone] Relation: Daughter  Code Status: DNR  Allergies: Nubain; Mirtazapine; Pineapple; Statins; and Iodine  Chief Complaint  Patient presents with  . Medical Management of Chronic Issues    Routine Visit    HPI: Patient is 82 y.o. female who is being seen for routine issues of AF, CHF and hypothyroidism.  Past Medical History  Diagnosis Date  . GI bleed 2007  . Hypertension   . TIA (transient ischemic attack)   . Hypothyroidism   . Abdominal aortic aneurysm ([place])   . CAD (coronary artery disease)   . SSS (sick sinus syndrome) ([place]) 10/05/2007    Medtronic Adapta  . Myocardial infarction ([place])   . Atrial fibrillation ([place])   . COPD (chronic obstructive pulmonary disease) ([place])   . Asthma     hx  . Anemia     hx  . Blood transfusion   . UTI (lower urinary tract infection)     hx  . Renal failure, chronic   . H/O hiatal hernia   . GERD (gastroesophageal reflux disease)   . Stroke ([place])   . Osteoporosis   . Blood transfusion without reported diagnosis   . Pacemaker     Past Surgical History  Procedure Laterality Date  . Pacemaker insertion  10/05/2007    Medtronic Adapta  . Coronary stent placement      x9  . Cardiac catheterization    . Fracture surgery      femur fx, /w ORIF into HIP- 2008  . Total hip arthroplasty      planned for  08/27/2011 Left  . Total hip arthroplasty  08/27/2011    Procedure: TOTAL HIP ARTHROPLASTY;  Surgeon: Brandy Salen, MD;  Location: [facility];  Service: Orthopedics;  Laterality: Right;  . US echocardiography  08/23/2011    EF 50%,LA mod. dilated,mild to mod. mitral annular ca+,mod. MR,mild to mod TR,mild to mod. PH,trace AI.  [name] Nm myocar perf wall motion  10/08/2010    mild apical anterior ischemia  . Colonoscopy        Medication List       This list is accurate as of: 10/01/15 11:59 PM.  Always use your most recent med list.               acetaminophen 325 MG tablet  Commonly known as:  TYLENOL  Take 325 mg by mouth at bedtime.     acetaminophen 325 MG tablet  Commonly known as:  TYLENOL  Take 2 tablets (650 mg total) by mouth every 6 (six) hours as needed for mild pain (or Fever >/= 101).     amiodarone 200 MG tablet  Commonly known as:  PACERONE  TAKE 1 TABLET BY MOUTH EVERY DAY     Biotin 1000 MCG tablet  Take 1,000 mcg by mouth daily.     CALCIUM 600+D 600-400 MG-UNIT tablet  Generic drug:  Calcium Carbonate-Vitamin D  Take 1 tablet by mouth daily.     clopidogrel 75 MG tablet  Commonly known as:  PLAVIX  TAKE 1 TABLET (75 MG TOTAL) BY MOUTH DAILY.     feeding supplement Liqd  Take 237 mLs by mouth daily.     ferrous sulfate 325 (65 FE) MG tablet  Take 325 mg by mouth daily.     furosemide 20 MG tablet  Commonly known as:  LASIX  Take 1 tablet (20 mg total) by mouth daily.     levothyroxine 125 MCG tablet  Commonly known as:  SYNTHROID, LEVOTHROID  Take 125 mcg by mouth daily before breakfast.     nitroGLYCERIN 0.4 MG SL tablet  Commonly known as:  NITROSTAT  Place 1 tablet (0.4 mg total) under the tongue every 5 (five) minutes as needed for chest pain (For a total of 3 tablets, if chest pain persist to call 911).     OXYGEN  Inhale into the lungs as needed (to keep O2 sat above 90%).     pantoprazole 40 MG tablet  Commonly known as:  PROTONIX  TAKE 1  TABLET (40 MG TOTAL) BY MOUTH DAILY.     potassium chloride 10 MEQ tablet  Commonly known as:  K-DUR  Take 10 mEq by mouth 2 (two) times daily with a meal. 9am, 6pm     promethazine 25 MG tablet  Commonly known as:  PHENERGAN  Take 25 mg by mouth every 6 (six) hours as needed for nausea or vomiting.     rOPINIRole 0.25 MG tablet  Commonly known as:  REQUIP  Take 0.25 mg by mouth at bedtime.     vitamin B-12 1000 MCG tablet  Commonly known as:  CYANOCOBALAMIN  Take 1,000 mcg by mouth daily.        No orders of the defined types were placed in this encounter.    Immunization History  Administered Date(s) Administered  . Influenza,inj,Quad PF,36+ Mos 12/18/2013, 12/09/2014  . PPD Test 02/02/2015, 02/27/2015  . Pneumococcal Conjugate-13 12/09/2014    Social History  Substance Use Topics  . Smoking status: Former Smoker    Types: Cigarettes  . Smokeless tobacco: Never Used     Comment: quit 40 yrs ago- was smoking 2 packs a day at the most  . Alcohol Use: No    Review of Systems  DATA OBTAINED: from patient GENERAL:  no fevers, fatigue, appetite changes SKIN: No itching, rash HEENT: No complaint RESPIRATORY: No cough, wheezing, SOB CARDIAC: No chest pain, palpitations, lower extremity edema  GI: No abdominal pain, No N/V/D or constipation, No heartburn or reflux  GU: No dysuria, frequency or urgency, or incontinence  MUSCULOSKELETAL: No unrelieved bone/joint pain NEUROLOGIC: No headache, dizziness  PSYCHIATRIC: No overt anxiety or sadness  Filed Vitals:   10/01/15 1132  BP: 122/71  Pulse: 63  Temp: 98.1 F (36.7 C)  Resp: 16    Physical Exam  GENERAL APPEARANCE: Alert, conversant, No acute distress  SKIN: No diaphoresis rash HEENT: Unremarkable RESPIRATORY: Breathing is even, unlabored. Lung sounds are clear   CARDIOVASCULAR: Heart RRR no murmurs, rubs or gallops. No peripheral edema  GASTROINTESTINAL: Abdomen is soft, non-tender, not distended w/  normal bowel sounds.  GENITOURINARY: Bladder non tender, not distended  MUSCULOSKELETAL: No abnormal joints or musculature NEUROLOGIC: Cranial nerves 2-12 grossly intact. Moves all extremities PSYCHIATRIC: Mood and affect appropriate to situation, no behavioral issues  Patient Active Problem List   Diagnosis Date Noted  .  Abdominal aortic aneurysm (AAA) without rupture ([place]) 08/21/2015  . DNR (do not resuscitate)   . Weakness generalized   . GERD (gastroesophageal reflux disease) 07/04/2015  . Dysphagia 07/04/2015  . Transaminitis 06/24/2015  . HCAP (healthcare-associated pneumonia) 06/22/2015  . Pneumonia 06/22/2015  . COPD exacerbation ([place]) 06/21/2015  . Cough 06/09/2015  . Fever, unspecified 06/09/2015  . Chronic diastolic congestive heart failure ([place]) 04/24/2015  . Restless legs 04/24/2015  . Restless leg 04/24/2015  . Pain and swelling of left lower extremity 02/15/2015  . Candidiasis of perineum 02/15/2015  . Hemorrhoids 02/15/2015  . CKD (chronic kidney disease) stage 3, GFR 30-59 ml/min 02/06/2015  . Acute respiratory failure with hypoxia ([place]) 01/29/2015  . Acute pulmonary edema ([place]) 01/29/2015  . SOB (shortness of breath)   . Palliative care encounter   . Sepsis ([place]) 01/26/2015  . Cellulitis of leg, left 01/26/2015  . Protein-calorie malnutrition, severe ([place]) 11/06/2014  . Pressure ulcer 11/06/2014  . Acute diastolic CHF (congestive heart failure) ([place]) 11/04/2014  . Weakness 11/04/2014  . Hypertensive heart disease with CHF (congestive heart failure) ([place]) 11/04/2014  . Macrocytic anemia 11/04/2014  . Cardiomyopathy, ischemic 11/04/2014  . CHF exacerbation ([place]) 11/03/2014  . Body mass index (BMI) of 20.0-20.9 in adult 06/23/2014  . Hard of hearing 06/23/2014  . Rectal bleeding 07/16/2013  . Hyperlipidemia, statin intolerance 04/30/2013  . Paroxysmal atrial fibrillation ([place]) 04/30/2013  . Pacemaker - dual chamber Medtronic 2009 04/30/2013  . AAA  (abdominal aortic aneurysm) ([place]) 04/30/2013  . Bilateral carotid artery stenosis 04/30/2013  . PAD (peripheral artery disease) ([place]) 04/30/2013  . Constipation 08/31/2011  . Hypotension 08/28/2011  . S/P total hip arthroplasty 08/28/2011  . CAD (coronary artery disease) 08/28/2011  . Hypothyroid 08/28/2011  . Klebsiella cystitis 08/28/2011  . Thrombocytopenia ([place]) 08/28/2011  . SSS (sick sinus syndrome) ([facility]) 10/05/2007    CBC    Component Value Date/Time   WBC 5.8 06/24/2015 0648   WBC 7.9 06/19/2015   RBC 3.73* 06/24/2015 0648   RBC 3.74* 08/01/2014 1401   HGB 11.1* 06/24/2015 0648   HCT 36.5 06/24/2015 0648   HCT 38.0 08/01/2014 1401   PLT 185 06/24/2015 0648   PLT 146* 08/01/2014 1401   MCV 97.9 06/24/2015 0648   MCV 102* 08/01/2014 1401   LYMPHSABS 1.2 06/23/2015 0352   LYMPHSABS 1.4 08/01/2014 1401   MONOABS 0.6 06/23/2015 0352   EOSABS 0.1 06/23/2015 0352   EOSABS 0.1 08/01/2014 1401   BASOSABS 0.0 06/23/2015 0352   BASOSABS 0.0 08/01/2014 1401    CMP     Component Value Date/Time   NA 141 06/26/2015 0540   NA 138 06/19/2015   K 4.4 06/26/2015 0540   CL 106 06/26/2015 0540   CO2 27 06/26/2015 0540   GLUCOSE 107* 06/26/2015 0540   GLUCOSE 88 08/01/2014 1401   BUN 12 06/26/2015 0540   BUN 32* 06/19/2015   CREATININE 1.17* 06/26/2015 0540   CREATININE 1.5* 06/19/2015   CREATININE 1.16* 01/22/2015 1203   CALCIUM 8.5* 06/26/2015 0540   PROT 4.9* 06/24/2015 0648   PROT 6.0 08/01/2014 1401   ALBUMIN 2.1* 06/26/2015 0540   ALBUMIN 3.9 08/01/2014 1401   AST 88* 06/24/2015 0648   ALT 57* 06/24/2015 0648   ALKPHOS 73 06/24/2015 0648   BILITOT 0.7 06/24/2015 0648   BILITOT 0.3 08/01/2014 1401   GFRNONAA 38* 06/26/2015 0540   GFRAA 44* 06/26/2015 0540    Assessment and Plan  Paroxysmal atrial fibrillation ([facility]) SNF -  Stable;Continue amiodarone and Plavix. Not on any anticoagulation due to high risk of bleeding.  Chronic diastolic congestive heart  failure ([facility]) SNF - stable; pt has not required inc inlasix;cont lasix 20 mg dailyu  Hypothyroid SNF - controlled ;cont synthroid 125 mcg daily    Anne D. Brandy Coil, MD

## 2015-10-04 ENCOUNTER — Encounter: Payer: Self-pay | Admitting: Internal Medicine

## 2015-10-16 ENCOUNTER — Encounter: Payer: Self-pay | Admitting: Internal Medicine

## 2015-10-16 ENCOUNTER — Non-Acute Institutional Stay (SKILLED_NURSING_FACILITY): Payer: Medicare Other | Admitting: Internal Medicine

## 2015-10-16 DIAGNOSIS — R05 Cough: Secondary | ICD-10-CM

## 2015-10-16 DIAGNOSIS — R059 Cough, unspecified: Secondary | ICD-10-CM

## 2015-10-16 DIAGNOSIS — D509 Iron deficiency anemia, unspecified: Secondary | ICD-10-CM

## 2015-10-16 DIAGNOSIS — I5032 Chronic diastolic (congestive) heart failure: Secondary | ICD-10-CM

## 2015-10-16 NOTE — Progress Notes (Signed)
Location:    [facility] Room Number: 207/W Place of Service:  SNF (31) Provider:  Granville Lewis  No PCP Per Patient  Patient Care Team: No Pcp Per Patient as PCP - General ([place]) Gayland Curry, DO as Consulting Physician (Geriatric Medicine)  Extended Emergency Contact Information Primary Emergency Contact: [name],[name] Address: [address]          [place], [place] [address] [name] of [place] Phone: [phone] Mobile Phone: [phone] Relation: Daughter Secondary Emergency Contact: [name] Address: [place],  [address] [name] of [place] Phone: [phone] Work Phone: [phone] Mobile Phone: [phone] Relation: Daughter  Code Status:  DNR Goals of care: Advanced Directive information Advanced Directives 10/16/2015  Does patient have an advance directive? Yes  Type of Advance Directive Out of facility DNR (pink MOST or yellow form)  Does patient want to make changes to advanced directive? No - Patient declined  Copy of advanced directive(s) in chart? Yes  Would patient like information on creating an advanced directive? -  Pre-existing out of facility DNR order (yellow form or pink MOST form) -    Chief complaint Acute visit secondary to cough and sore throat  HPI:  Pt is a 82 y.o. female seen today for an acute visit for  Complaints of cough and sore throat.  Chest she says the sore throat is better today however has a continued cough productive of some clear phlegm at times.  She does not complain of any shortness of breath she does have a history of pneumonia and COPD but this has been stable now for an extended period of time.  She says she has received Robitussin as needed that has given her some relief.  She has been afebrile otherwise has no complaints.    Past Medical History:  Diagnosis Date  . Abdominal aortic aneurysm ([place])   . Anemia    hx  . Asthma    hx   . Atrial fibrillation ([place])   . Blood transfusion   . Blood transfusion without reported diagnosis   . CAD (coronary artery disease)   . COPD (chronic obstructive pulmonary disease) ([place])   . GERD (gastroesophageal reflux disease)   . GI bleed 2007  . H/O hiatal hernia   . Hypertension   . Hypothyroidism   . Myocardial infarction ([place])   . Osteoporosis   . Pacemaker   . Renal failure, chronic   . SSS (sick sinus syndrome) ([place]) 10/05/2007   Medtronic Adapta  . Stroke ([place])   . TIA (transient ischemic attack)   . UTI (lower urinary tract infection)    hx   Past Surgical History:  Procedure Laterality Date  . CARDIAC CATHETERIZATION    . COLONOSCOPY    . CORONARY STENT PLACEMENT     x9  . FRACTURE SURGERY     femur fx, /w ORIF into HIP- 2008  . NM MYOCAR PERF WALL MOTION  10/08/2010   mild apical anterior ischemia  . PACEMAKER INSERTION  10/05/2007   Medtronic Adapta  . TOTAL HIP ARTHROPLASTY     planned for 08/27/2011 Left  . TOTAL HIP ARTHROPLASTY  08/27/2011   Procedure: TOTAL HIP ARTHROPLASTY;  Surgeon: Kerin Salen, MD;  Location: [facility];  Service: Orthopedics;  Laterality: Right;  . US ECHOCARDIOGRAPHY  08/23/2011   EF 50%,LA mod. dilated,mild to mod. mitral annular ca+,mod. MR,mild to mod TR,mild to mod. PH,trace  AI.    Allergies  Allergen Reactions  . Nubain [Nalbuphine Hcl] Anaphylaxis  . Mirtazapine Other (See Comments)    "Weakness in legs"  . Pineapple Other (See Comments)    Listed on [facility] 06/2015  . Statins Other (See Comments)    Leg weakness  . Iodine Rash      Medication List       Accurate as of 10/16/15  1:19 PM. Always use your most recent med list.          acetaminophen 325 MG tablet Commonly known as:  TYLENOL Take 325 mg by mouth at bedtime.   acetaminophen 325 MG tablet Commonly known as:  TYLENOL Take 650 mg by mouth every 6 (six) hours as needed for mild pain.   amiodarone 200 MG tablet Commonly known as:  PACERONE TAKE 1 TABLET  BY MOUTH EVERY DAY   Biotin 1000 MCG tablet Take 1,000 mcg by mouth daily.   CALCIUM 600+D 600-400 MG-UNIT tablet Generic drug:  Calcium Carbonate-Vitamin D Take 1 tablet by mouth daily.   clopidogrel 75 MG tablet Commonly known as:  PLAVIX TAKE 1 TABLET (75 MG TOTAL) BY MOUTH DAILY.   feeding supplement Liqd Take 237 mLs by mouth daily.   ferrous sulfate 325 (65 FE) MG tablet Take 325 mg by mouth daily.   furosemide 20 MG tablet Commonly known as:  LASIX Take 1 tablet (20 mg total) by mouth daily.   levothyroxine 125 MCG tablet Commonly known as:  SYNTHROID, LEVOTHROID Take 125 mcg by mouth daily before breakfast.   nitroGLYCERIN 0.4 MG SL tablet Commonly known as:  NITROSTAT Place 1 tablet (0.4 mg total) under the tongue every 5 (five) minutes as needed for chest pain (For a total of 3 tablets, if chest pain persist to call 911).   OXYGEN Inhale into the lungs as needed (to keep O2 sat above 90%).   pantoprazole 40 MG tablet Commonly known as:  PROTONIX TAKE 1 TABLET (40 MG TOTAL) BY MOUTH DAILY.   potassium chloride 10 MEQ tablet Commonly known as:  K-DUR Take 10 mEq by mouth 2 (two) times daily with a meal. 9am, 6pm   promethazine 25 MG tablet Commonly known as:  PHENERGAN Take 25 mg by mouth every 6 (six) hours as needed for nausea or vomiting.   rOPINIRole 0.25 MG tablet Commonly known as:  REQUIP Take 0.25 mg by mouth at bedtime.   vitamin B-12 1000 MCG tablet Commonly known as:  CYANOCOBALAMIN Take 1,000 mcg by mouth daily.       Review of Systems   DATA OBTAINED: from patient GENERAL:  no fevers, fatigue, appetite changes SKIN: No itching, rash HEENT: No complaint previously had a sore throat but she says that is improved today RESPIRATORY: Has a somewhat productive cough of clear phlegm does not really complain of any shortness of breath however CARDIAC: No chest pain, palpitations, lower extremity edema  GI: No abdominal pain, No N/V/D or  constipation, No heartburn or reflux  GU: No dysuria, frequency or urgency, or incontinence  MUSCULOSKELETAL: No unrelieved bone/joint pain NEUROLOGIC: No headache, dizziness  PSYCHIATRIC: No overt anxiety or sadness  Immunization History  Administered Date(s) Administered  . Influenza,inj,Quad PF,36+ Mos 12/18/2013, 12/09/2014  . PPD Test 02/02/2015, 02/27/2015  . Pneumococcal Conjugate-13 12/09/2014   Pertinent  Health Maintenance Due  Topic Date Due  . INFLUENZA VACCINE  10/14/2015  . PNA vac Low Risk Adult (2 of 2 - PPSV23) 12/09/2015  . DEXA SCAN  Completed   Fall Risk  12/09/2014 10/15/2014 09/12/2014 08/01/2014  Falls in the past year? No No No Yes  Number falls in past yr: - - - 1  Injury with Fall? - - - Yes   Functional Status Survey:    Vitals:   10/16/15 1255  BP: 120/64  Pulse: 83  Resp: 18  Temp: 97.9 F (36.6 C)  TempSrc: Oral  Weight: 109 lb 3.2 oz (49.5 kg)  Height: 5\' 1"  (1.549 m)   Body mass index is 20.63 kg/m. Physical Exam  GENERAL APPEARANCE: Alert, conversant, No acute distress resting comfortably in her wheelchair was sleeping when I entered the room but easily arousable  SKIN: No diaphoresis rash HEENT: Unremarkable oropharynx clear mucous membranes moist I do not see any significant exudate RESPIRATORY: Breathing is even, unlabored. Lung sounds are clear somewhat shallow but does not appear to be any different from baseline   CARDIOVASCULAR: Heart RRR no murmurs, rubs or gallops. Trace  peripheral edema  GASTROINTESTINAL: Abdomen is soft, non-tender, not distended w/ normal bowel sounds.    MUSCULOSKELETAL: No abnormal joints or musculature NEUROLOGIC: Cranial nerves 2-12 grossly intact. Moves all extremities PSYCHIATRIC: Mood and affect appropriate to situation, no behavioral issues  Labs reviewed:  Recent Labs  01/30/15 0530  06/23/15 0352 06/24/15 0648 06/26/15 0540  NA  --   < > 144 143 141  K  --   < > 4.6 4.4 4.4  CL  --   < >  105 107 106  CO2  --   < > 32 29 27  GLUCOSE  --   < > 88 80 107*  BUN  --   < > 27* 18 12  CREATININE  --   < > 1.50* 1.14* 1.17*  CALCIUM  --   < > 8.2* 8.5* 8.5*  MG 1.4*  --   --   --   --   PHOS  --   --   --   --  2.1*  < > = values in this interval not displayed.  Recent Labs  06/22/15 1609 06/23/15 0352 06/24/15 0648 06/26/15 0540  AST 123* 98* 88*  --   ALT 71* 60* 57*  --   ALKPHOS 89 66 73  --   BILITOT 0.8 0.9 0.7  --   PROT 5.8* 4.8* 4.9*  --   ALBUMIN 2.6* 2.1* 2.2* 2.1*    Recent Labs  01/26/15 1030  06/22/15 1609 06/23/15 0352 06/24/15 0648  WBC 30.4*  < > 8.7 6.2 5.8  NEUTROABS 28.6*  --  6.6 4.3  --   HGB 11.6*  < > 12.3 10.7* 11.1*  HCT 36.9  < > 38.9 34.5* 36.5  MCV 98.9  < > 96.8 96.6 97.9  PLT 157  < > 197 178 185  < > = values in this interval not displayed. Lab Results  Component Value Date   TSH 4.16 04/16/2015   Lab Results  Component Value Date   HGBA1C  02/27/2007    5.8 (NOTE)   The ADA recommends the following therapeutic goals for glycemic   control related to Hgb A1C measurement:   Goal of Therapy:   < 7.0% Hgb A1C   Action Suggested:  > 8.0% Hgb A1C   Ref:  Diabetes Care, 22, Suppl. 1, 1999   Lab Results  Component Value Date   CHOL 182 12/01/2012   HDL 56 12/01/2012   LDLCALC 102 (H) 12/01/2012   TRIG 118  12/01/2012   CHOLHDL 3.3 12/01/2012    Significant Diagnostic Results in last 30 days:  No results found.  Assessment/Plan  Cough-at this point patient appears to be stable does not complaining of any shortness of breath she is receiving Robitussin as needed will make this routine for 3 days every 6 hours and then when necessary-I did discuss possibility of a chest x-ray but she would really like to defer for now saying she knows herself and she doesn't feel she is at the stage that she is at risk for pneumonia physical exam was quite benign at this point monitor vital signs pulse ox every shift for 48 hours as well to  keep an eye on this.  In regards diastolic CHF this appears to be stable-continues on Lasix with potassium will update a metabolic panel for updated values.  #3 anemia with an element of iron deficiency she is on iron recent hemoglobin of 11.1 appears to be relatively stable we will update this as well.  [place], [place], [place]

## 2015-10-20 ENCOUNTER — Encounter: Payer: Self-pay | Admitting: Internal Medicine

## 2015-10-20 ENCOUNTER — Non-Acute Institutional Stay (SKILLED_NURSING_FACILITY): Payer: Medicare Other | Admitting: Internal Medicine

## 2015-10-20 DIAGNOSIS — R0989 Other specified symptoms and signs involving the circulatory and respiratory systems: Secondary | ICD-10-CM | POA: Diagnosis not present

## 2015-10-20 DIAGNOSIS — R05 Cough: Secondary | ICD-10-CM

## 2015-10-20 DIAGNOSIS — R059 Cough, unspecified: Secondary | ICD-10-CM

## 2015-10-20 NOTE — Progress Notes (Signed)
MRN: [id number] Name: [name]  Sex: female Age: 83 y.o. DOB: October 04, 1917  [place] #:  Facility/Room: [facility]/ [address] Level Of Care: SNF Provider: [name], MD Emergency Contacts: Extended Emergency Contact Information Primary Emergency Contact: [name],[name] Address: [address]          [place], [place] [address] [name] of [place] Phone: [phone] Mobile Phone: [phone] Relation: Daughter Secondary Emergency Contact: [name] Address: [place], [place] [address] [name] of [place] Phone: [phone] Work Phone: [phone] Mobile Phone: [phone] Relation: Daughter  Code Status: DNR  Allergies: Nubain [nalbuphine hcl]; Mirtazapine; Pineapple; Statins; and Iodine  Chief Complaint  Patient presents with  . Acute Visit    Acute    HPI: Patient is 82 y.o. female who is being seen for a cough pt has had for a week. Clear sputum, no fevers, get robitussin prn that helps. Pt denies SOB or wheezing, she admits she does not feel like she did when she had PNA. Main c/o she has trouble getting the sputum up and the coughing is tiring her out.  Past Medical History:  Diagnosis Date  . Abdominal aortic aneurysm ([place])   . Anemia    hx  . Asthma    hx  . Atrial fibrillation ([place])   . Blood transfusion   . Blood transfusion without reported diagnosis   . CAD (coronary artery disease)   . COPD (chronic obstructive pulmonary disease) ([place])   . GERD (gastroesophageal reflux disease)   . GI bleed 2007  . H/O hiatal hernia   . Hypertension   . Hypothyroidism   . Myocardial infarction ([place])   . Osteoporosis   . Pacemaker   . Renal failure, chronic   . SSS (sick sinus syndrome) ([place]) 10/05/2007   Medtronic Adapta  . Stroke ([place])   . TIA (transient ischemic attack)   . UTI (lower urinary tract infection)    hx    Past Surgical History:  Procedure Laterality Date  . CARDIAC CATHETERIZATION    .  COLONOSCOPY    . CORONARY STENT PLACEMENT     x9  . FRACTURE SURGERY     femur fx, /w ORIF into HIP- 2008  . NM MYOCAR PERF WALL MOTION  10/08/2010   mild apical anterior ischemia  . PACEMAKER INSERTION  10/05/2007   Medtronic Adapta  . TOTAL HIP ARTHROPLASTY     planned for 08/27/2011 Left  . TOTAL HIP ARTHROPLASTY  08/27/2011   Procedure: TOTAL HIP ARTHROPLASTY;  Surgeon: Kerin Salen, MD;  Location: [facility];  Service: Orthopedics;  Laterality: Right;  . US ECHOCARDIOGRAPHY  08/23/2011   EF 50%,LA mod. dilated,mild to mod. mitral annular ca+,mod. MR,mild to mod TR,mild to mod. PH,trace AI.      Medication List       Accurate as of 10/20/15  2:55 PM. Always use your most recent med list.          acetaminophen 325 MG tablet Commonly known as:  TYLENOL Take 325 mg by mouth at bedtime.   acetaminophen 325 MG tablet Commonly known as:  TYLENOL Take 650 mg by mouth every 6 (six) hours as needed for mild pain.   amiodarone 200 MG tablet Commonly known as:  PACERONE TAKE 1 TABLET BY MOUTH EVERY DAY   Biotin 1000 MCG tablet Take 1,000 mcg by mouth daily.   CALCIUM 600+D 600-400 MG-UNIT tablet Generic drug:  Calcium Carbonate-Vitamin D Take 1 tablet by mouth daily.   clopidogrel 75 MG tablet Commonly known as:  PLAVIX TAKE 1 TABLET (75 MG TOTAL) BY MOUTH DAILY.   feeding supplement Liqd Take 237 mLs by mouth daily.   ferrous sulfate 325 (65 FE) MG tablet Take 325 mg by mouth daily.   furosemide 20 MG tablet Commonly known as:  LASIX Take 1 tablet (20 mg total) by mouth daily.   levothyroxine 125 MCG tablet Commonly known as:  SYNTHROID, LEVOTHROID Take 125 mcg by mouth daily before breakfast.   nitroGLYCERIN 0.4 MG SL tablet Commonly known as:  NITROSTAT Place 1 tablet (0.4 mg total) under the tongue every 5 (five) minutes as needed for chest pain (For a total of 3 tablets, if chest pain persist to call 911).   OXYGEN Inhale into the lungs as needed (to keep O2  sat above 90%).   pantoprazole 40 MG tablet Commonly known as:  PROTONIX TAKE 1 TABLET (40 MG TOTAL) BY MOUTH DAILY.   potassium chloride 10 MEQ tablet Commonly known as:  K-DUR Take 10 mEq by mouth 2 (two) times daily with a meal. 9am, 6pm   promethazine 25 MG tablet Commonly known as:  PHENERGAN Take 25 mg by mouth every 6 (six) hours as needed for nausea or vomiting.   rOPINIRole 0.25 MG tablet Commonly known as:  REQUIP Take 0.25 mg by mouth at bedtime.   vitamin B-12 1000 MCG tablet Commonly known as:  CYANOCOBALAMIN Take 1,000 mcg by mouth daily.       No orders of the defined types were placed in this encounter.   Immunization History  Administered Date(s) Administered  . Influenza,inj,Quad PF,36+ Mos 12/18/2013, 12/09/2014  . PPD Test 02/02/2015, 02/27/2015  . Pneumococcal Conjugate-13 12/09/2014    Social History  Substance Use Topics  . Smoking status: Former Smoker    Types: Cigarettes  . Smokeless tobacco: Never Used     Comment: quit 40 yrs ago- was smoking 2 packs a day at the most  . Alcohol use No    Review of Systems  DATA OBTAINED: from patient, nurse - as per HPI GENERAL:  no fevers, fatigue, appetite changes SKIN: No itching, rash HEENT: No complaint RESPIRATORY: + cough, no wheezing, SOB CARDIAC: No chest pain, palpitations, lower extremity edema  GI: No abdominal pain, No N/V/D or constipation, No heartburn or reflux  GU: No dysuria, frequency or urgency, or incontinence  MUSCULOSKELETAL: No unrelieved bone/joint pain NEUROLOGIC: No headache, dizziness  PSYCHIATRIC: No overt anxiety or sadness  Vitals:   10/20/15 1448  BP: 120/64  Pulse: 82  Resp: 18  Temp: 97.3 F (36.3 C)    Physical Exam  GENERAL APPEARANCE: Alert, conversant, No acute distress  SKIN: No diaphoresis rash HEENT: Unremarkable RESPIRATORY: Breathing is even, unlabored. Wet cough Lung sounds are slt rhonchi, no rales or wheezes; 95% on RA  CARDIOVASCULAR:  Heart RRR no murmurs, rubs or gallops. No peripheral edema  GASTROINTESTINAL: Abdomen is soft, non-tender, not distended w/ normal bowel sounds.  GENITOURINARY: Bladder non tender, not distended  MUSCULOSKELETAL: No abnormal joints or musculature NEUROLOGIC: Cranial nerves 2-12 grossly intact. Moves all extremities PSYCHIATRIC: Mood and affect appropriate to situation, no behavioral issues  Patient Active Problem List   Diagnosis Date Noted  . Abdominal aortic aneurysm (AAA) without rupture ([place]) 08/21/2015  . DNR (do not resuscitate)   . Weakness generalized   . GERD (gastroesophageal reflux disease) 07/04/2015  . Dysphagia 07/04/2015  .  Transaminitis 06/24/2015  . HCAP (healthcare-associated pneumonia) 06/22/2015  . Pneumonia 06/22/2015  . COPD exacerbation ([place]) 06/21/2015  . Cough 06/09/2015  . Fever, unspecified 06/09/2015  . Chronic diastolic congestive heart failure ([place]) 04/24/2015  . Restless legs 04/24/2015  . Restless leg 04/24/2015  . Pain and swelling of left lower extremity 02/15/2015  . Candidiasis of perineum 02/15/2015  . Hemorrhoids 02/15/2015  . CKD (chronic kidney disease) stage 3, GFR 30-59 ml/min 02/06/2015  . Acute respiratory failure with hypoxia ([place]) 01/29/2015  . Acute pulmonary edema ([place]) 01/29/2015  . SOB (shortness of breath)   . Palliative care encounter   . Sepsis ([place]) 01/26/2015  . Cellulitis of leg, left 01/26/2015  . Protein-calorie malnutrition, severe ([place]) 11/06/2014  . Pressure ulcer 11/06/2014  . Acute diastolic CHF (congestive heart failure) ([place]) 11/04/2014  . Weakness 11/04/2014  . Hypertensive heart disease with CHF (congestive heart failure) ([place]) 11/04/2014  . Macrocytic anemia 11/04/2014  . Cardiomyopathy, ischemic 11/04/2014  . CHF exacerbation ([place]) 11/03/2014  . Body mass index (BMI) of 20.0-20.9 in adult 06/23/2014  . Hard of hearing 06/23/2014  . Rectal bleeding 07/16/2013  . Hyperlipidemia, statin intolerance  04/30/2013  . Paroxysmal atrial fibrillation ([place]) 04/30/2013  . Pacemaker - dual chamber Medtronic 2009 04/30/2013  . AAA (abdominal aortic aneurysm) ([place]) 04/30/2013  . Bilateral carotid artery stenosis 04/30/2013  . PAD (peripheral artery disease) ([place]) 04/30/2013  . Constipation 08/31/2011  . Hypotension 08/28/2011  . S/P total hip arthroplasty 08/28/2011  . CAD (coronary artery disease) 08/28/2011  . Hypothyroid 08/28/2011  . Klebsiella cystitis 08/28/2011  . Thrombocytopenia ([place]) 08/28/2011  . SSS (sick sinus syndrome) ([facility]) 10/05/2007    CBC    Component Value Date/Time   WBC 5.8 06/24/2015 0648   RBC 3.73 (L) 06/24/2015 0648   HGB 11.1 (L) 06/24/2015 0648   HCT 36.5 06/24/2015 0648   HCT 38.0 08/01/2014 1401   PLT 185 06/24/2015 0648   PLT 146 (L) 08/01/2014 1401   MCV 97.9 06/24/2015 0648   MCV 102 (H) 08/01/2014 1401   LYMPHSABS 1.2 06/23/2015 0352   LYMPHSABS 1.4 08/01/2014 1401   MONOABS 0.6 06/23/2015 0352   EOSABS 0.1 06/23/2015 0352   EOSABS 0.1 08/01/2014 1401   BASOSABS 0.0 06/23/2015 0352   BASOSABS 0.0 08/01/2014 1401    CMP     Component Value Date/Time   NA 141 06/26/2015 0540   NA 138 06/19/2015   K 4.4 06/26/2015 0540   CL 106 06/26/2015 0540   CO2 27 06/26/2015 0540   GLUCOSE 107 (H) 06/26/2015 0540   BUN 12 06/26/2015 0540   BUN 32 (A) 06/19/2015   CREATININE 1.17 (H) 06/26/2015 0540   CREATININE 1.16 (H) 01/22/2015 1203   CALCIUM 8.5 (L) 06/26/2015 0540   PROT 4.9 (L) 06/24/2015 0648   PROT 6.0 08/01/2014 1401   ALBUMIN 2.1 (L) 06/26/2015 0540   ALBUMIN 3.9 08/01/2014 1401   AST 88 (H) 06/24/2015 0648   ALT 57 (H) 06/24/2015 0648   ALKPHOS 73 06/24/2015 0648   BILITOT 0.7 06/24/2015 0648   BILITOT 0.3 08/01/2014 1401   GFRNONAA 38 (L) 06/26/2015 0540   GFRAA 44 (L) 06/26/2015 0540    Assessment and Plan  COUGH-  Don't think PNA, sensorium is crystal clear but will get CXR; the resi is comfor; will schedule robirussin 3 tsp  TID for 10 days and alb neb BID for 5 days; scheduled; will cont monitor   Time spent > 25 min;> 50%  of time with patient was spent reviewing records, labs, tests and studies, counseling and developing plan of care  [name], MD

## 2015-10-21 DIAGNOSIS — I10 Essential (primary) hypertension: Secondary | ICD-10-CM | POA: Diagnosis not present

## 2015-10-22 LAB — CBC AND DIFFERENTIAL
HCT: 39 % (ref 36–46)
Hemoglobin: 12.4 g/dL (ref 12.0–16.0)
Platelets: 172 10*3/uL (ref 150–399)
WBC: 6.7 10*3/mL

## 2015-10-22 LAB — BASIC METABOLIC PANEL
BUN: 28 mg/dL — AB (ref 4–21)
CREATININE: 1.5 mg/dL — AB (ref 0.5–1.1)
Glucose: 194 mg/dL
POTASSIUM: 4.3 mmol/L (ref 3.4–5.3)
Sodium: 137 mmol/L (ref 137–147)

## 2015-10-24 ENCOUNTER — Encounter: Payer: Self-pay | Admitting: Internal Medicine

## 2015-10-24 ENCOUNTER — Non-Acute Institutional Stay (SKILLED_NURSING_FACILITY): Payer: Medicare Other | Admitting: Internal Medicine

## 2015-10-24 DIAGNOSIS — D539 Nutritional anemia, unspecified: Secondary | ICD-10-CM | POA: Diagnosis not present

## 2015-10-24 DIAGNOSIS — N183 Chronic kidney disease, stage 3 unspecified: Secondary | ICD-10-CM

## 2015-10-24 DIAGNOSIS — I714 Abdominal aortic aneurysm, without rupture, unspecified: Secondary | ICD-10-CM

## 2015-10-24 NOTE — Progress Notes (Signed)
MRN: [id number] Name: [name]  Sex: female Age: 82 y.o. DOB: 1917-06-19  [place] #:  Facility/Room: [facility] / [address] Level Of Care: SNF Provider: [name], MD Emergency Contacts: Extended Emergency Contact Information Primary Emergency Contact: [name],[name] Address: [address]          [place], [place] [address] [name] of [place] Phone: [phone] Mobile Phone: [phone] Relation: Daughter Secondary Emergency Contact: [name] Address: [place], [place] [address] [name] of [place] Phone: [phone] Work Phone: [phone] Mobile Phone: [phone] Relation: Daughter  Code Status: DNR  Allergies: Nubain [nalbuphine hcl]; Mirtazapine; Pineapple; Statins; and Iodine  Chief Complaint  Patient presents with  . Medical Management of Chronic Issues    Routine Visit    HPI: Patient is 82 y.o. female who is being seen for routine issues of AAA, CKD3 and macrocytic anemia.  Past Medical History:  Diagnosis Date  . Abdominal aortic aneurysm ([place])   . Anemia    hx  . Asthma    hx  . Atrial fibrillation ([place])   . Blood transfusion   . Blood transfusion without reported diagnosis   . CAD (coronary artery disease)   . COPD (chronic obstructive pulmonary disease) ([place])   . GERD (gastroesophageal reflux disease)   . GI bleed 2007  . H/O hiatal hernia   . Hypertension   . Hypothyroidism   . Myocardial infarction ([place])   . Osteoporosis   . Pacemaker   . Renal failure, chronic   . SSS (sick sinus syndrome) ([place]) 10/05/2007   Medtronic Adapta  . Stroke ([place])   . TIA (transient ischemic attack)   . UTI (lower urinary tract infection)    hx    Past Surgical History:  Procedure Laterality Date  . CARDIAC CATHETERIZATION    . COLONOSCOPY    . CORONARY STENT PLACEMENT     x9  . FRACTURE SURGERY     femur fx, /w ORIF into HIP- 2008  . NM MYOCAR PERF WALL MOTION  10/08/2010   mild apical anterior  ischemia  . PACEMAKER INSERTION  10/05/2007   Medtronic Adapta  . TOTAL HIP ARTHROPLASTY     planned for 08/27/2011 Left  . TOTAL HIP ARTHROPLASTY  08/27/2011   Procedure: TOTAL HIP ARTHROPLASTY;  Surgeon: Kerin Salen, MD;  Location: [facility];  Service: Orthopedics;  Laterality: Right;  . US ECHOCARDIOGRAPHY  08/23/2011   EF 50%,LA mod. dilated,mild to mod. mitral annular ca+,mod. MR,mild to mod TR,mild to mod. PH,trace AI.      Medication List       Accurate as of 10/24/15 11:59 PM. Always use your most recent med list.          acetaminophen 325 MG tablet Commonly known as:  TYLENOL Take 325 mg by mouth at bedtime.   acetaminophen 325 MG tablet Commonly known as:  TYLENOL Take 650 mg by mouth every 6 (six) hours as needed for mild pain.   amiodarone 200 MG tablet Commonly known as:  PACERONE TAKE 1 TABLET BY MOUTH EVERY DAY   Biotin 1000 MCG tablet Take 1,000 mcg by mouth daily.   CALCIUM 600+D 600-400 MG-UNIT tablet Generic drug:  Calcium Carbonate-Vitamin D Take 1 tablet by mouth daily.   clopidogrel 75 MG tablet Commonly known as:  PLAVIX TAKE 1 TABLET (75 MG TOTAL) BY MOUTH DAILY.   feeding supplement Liqd Take 237 mLs by mouth  daily.   ferrous sulfate 325 (65 FE) MG tablet Take 325 mg by mouth daily.   furosemide 20 MG tablet Commonly known as:  LASIX Take 1 tablet (20 mg total) by mouth daily.   levothyroxine 125 MCG tablet Commonly known as:  SYNTHROID, LEVOTHROID Take 125 mcg by mouth daily before breakfast.   nitroGLYCERIN 0.4 MG SL tablet Commonly known as:  NITROSTAT Place 1 tablet (0.4 mg total) under the tongue every 5 (five) minutes as needed for chest pain (For a total of 3 tablets, if chest pain persist to call 911).   OXYGEN Inhale into the lungs as needed (to keep O2 sat above 90%).   pantoprazole 40 MG tablet Commonly known as:  PROTONIX TAKE 1 TABLET (40 MG TOTAL) BY MOUTH DAILY.   potassium chloride 10 MEQ tablet Commonly known as:   K-DUR Take 10 mEq by mouth 2 (two) times daily with a meal. 9am, 6pm   promethazine 25 MG tablet Commonly known as:  PHENERGAN Take 25 mg by mouth every 6 (six) hours as needed for nausea or vomiting.   ROBITUSSIN DM 10-100 MG/5ML liquid Generic drug:  Dextromethorphan-Guaifenesin Take 3 teaspoon by mouth three times daily for 10 days. Stop Date 10/30/15   rOPINIRole 0.25 MG tablet Commonly known as:  REQUIP Take 0.25 mg by mouth at bedtime.   vitamin B-12 1000 MCG tablet Commonly known as:  CYANOCOBALAMIN Take 1,000 mcg by mouth daily.       No orders of the defined types were placed in this encounter.   Immunization History  Administered Date(s) Administered  . Influenza,inj,Quad PF,36+ Mos 12/18/2013, 12/09/2014  . PPD Test 02/02/2015, 02/27/2015  . Pneumococcal Conjugate-13 12/09/2014    Social History  Substance Use Topics  . Smoking status: Former Smoker    Types: Cigarettes  . Smokeless tobacco: Never Used     Comment: quit 40 yrs ago- was smoking 2 packs a day at the most  . Alcohol use No    Review of Systems  DATA OBTAINED: from patient, nurse GENERAL:  no fevers, fatigue, appetite changes SKIN: No itching, rash HEENT: No complaint RESPIRATORY: No cough, wheezing, SOB CARDIAC: No chest pain, palpitations, lower extremity edema  GI: No abdominal pain, No N/V/D or constipation, No heartburn or reflux  GU: No dysuria, frequency or urgency, or incontinence  MUSCULOSKELETAL: No unrelieved bone/joint pain NEUROLOGIC: No headache, dizziness  PSYCHIATRIC: No overt anxiety or sadness  Vitals:   10/24/15 0804  BP: 120/64  Pulse: 82  Resp: 18  Temp: 97.3 F (36.3 C)    Physical Exam  GENERAL APPEARANCE: Alert, conversant, No acute distress  SKIN: No diaphoresis rash HEENT: Unremarkable RESPIRATORY: Breathing is even, unlabored. Lung sounds are clear   CARDIOVASCULAR: Heart RRR no murmurs, rubs or gallops. No peripheral edema  GASTROINTESTINAL:  Abdomen is soft, non-tender, not distended w/ normal bowel sounds.  GENITOURINARY: Bladder non tender, not distended  MUSCULOSKELETAL: No abnormal joints or musculature NEUROLOGIC: Cranial nerves 2-12 grossly intact. Moves all extremities PSYCHIATRIC: Mood and affect appropriate to situation, no behavioral issues  Patient Active Problem List   Diagnosis Date Noted  . Abdominal aortic aneurysm (AAA) without rupture ([place]) 08/21/2015  . DNR (do not resuscitate)   . Weakness generalized   . GERD (gastroesophageal reflux disease) 07/04/2015  . Dysphagia 07/04/2015  . Transaminitis 06/24/2015  . HCAP (healthcare-associated pneumonia) 06/22/2015  . Pneumonia 06/22/2015  . COPD exacerbation ([place]) 06/21/2015  . Cough 06/09/2015  . Fever, unspecified 06/09/2015  .  Chronic diastolic congestive heart failure (HCC) 04/24/2015  . Restless legs 04/24/2015  . Restless leg 04/24/2015  . Pain and swelling of left lower extremity 02/15/2015  . Candidiasis of perineum 02/15/2015  . Hemorrhoids 02/15/2015  . CKD (chronic kidney disease) stage 3, GFR 30-59 ml/min 02/06/2015  . Acute respiratory failure with hypoxia (HCC) 01/29/2015  . Acute pulmonary edema (HCC) 01/29/2015  . SOB (shortness of breath)   . Palliative care encounter   . Sepsis (HCC) 01/26/2015  . Cellulitis of leg, left 01/26/2015  . Protein-calorie malnutrition, severe (HCC) 11/06/2014  . Pressure ulcer 11/06/2014  . Acute diastolic CHF (congestive heart failure) (HCC) 11/04/2014  . Weakness 11/04/2014  . Hypertensive heart disease with CHF (congestive heart failure) (HCC) 11/04/2014  . Macrocytic anemia 11/04/2014  . Cardiomyopathy, ischemic 11/04/2014  . CHF exacerbation (HCC) 11/03/2014  . Body mass index (BMI) of 20.0-20.9 in adult 06/23/2014  . Hard of hearing 06/23/2014  . Rectal bleeding 07/16/2013  . Hyperlipidemia, statin intolerance 04/30/2013  . Paroxysmal atrial fibrillation (HCC) 04/30/2013  . Pacemaker - dual  chamber Medtronic 2009 04/30/2013  . AAA (abdominal aortic aneurysm) (HCC) 04/30/2013  . Bilateral carotid artery stenosis 04/30/2013  . PAD (peripheral artery disease) (HCC) 04/30/2013  . Constipation 08/31/2011  . Hypotension 08/28/2011  . S/P total hip arthroplasty 08/28/2011  . CAD (coronary artery disease) 08/28/2011  . Hypothyroid 08/28/2011  . Klebsiella cystitis 08/28/2011  . Thrombocytopenia (HCC) 08/28/2011  . SSS (sick sinus syndrome) (HCC) 10/05/2007    CBC    Component Value Date/Time   WBC 5.8 06/24/2015 0648   RBC 3.73 (L) 06/24/2015 0648   HGB 11.1 (L) 06/24/2015 0648   HCT 36.5 06/24/2015 0648   HCT 38.0 08/01/2014 1401   PLT 185 06/24/2015 0648   PLT 146 (L) 08/01/2014 1401   MCV 97.9 06/24/2015 0648   MCV 102 (H) 08/01/2014 1401   LYMPHSABS 1.2 06/23/2015 0352   LYMPHSABS 1.4 08/01/2014 1401   MONOABS 0.6 06/23/2015 0352   EOSABS 0.1 06/23/2015 0352   EOSABS 0.1 08/01/2014 1401   BASOSABS 0.0 06/23/2015 0352   BASOSABS 0.0 08/01/2014 1401    CMP     Component Value Date/Time   NA 141 06/26/2015 0540   NA 138 06/19/2015   K 4.4 06/26/2015 0540   CL 106 06/26/2015 0540   CO2 27 06/26/2015 0540   GLUCOSE 107 (H) 06/26/2015 0540   BUN 12 06/26/2015 0540   BUN 32 (A) 06/19/2015   CREATININE 1.17 (H) 06/26/2015 0540   CREATININE 1.16 (H) 01/22/2015 1203   CALCIUM 8.5 (L) 06/26/2015 0540   PROT 4.9 (L) 06/24/2015 0648   PROT 6.0 08/01/2014 1401   ALBUMIN 2.1 (L) 06/26/2015 0540   ALBUMIN 3.9 08/01/2014 1401   AST 88 (H) 06/24/2015 0648   ALT 57 (H) 06/24/2015 0648   ALKPHOS 73 06/24/2015 0648   BILITOT 0.7 06/24/2015 0648   BILITOT 0.3 08/01/2014 1401   GFRNONAA 38 (L) 06/26/2015 0540   GFRAA 44 (L) 06/26/2015 0540    Assessment and Plan  Abdominal aortic aneurysm (AAA) without rupture (HCC) Size is stable at 3.8 cm;veru doubtful she would be a surgical ; plan to cont BP control, pt is intolerant to satins, not Diabetic; cardiology did not  feel that continued monitoring was necessary  CKD (chronic kidney disease) stage 3, GFR 30-59 ml/min In 4 /2017 GFR 38, stable from prior with BUN/Cr  32/1.5; will cont to monitor at intervals esp as pt is on  lasix  Macrocytic anemia Las t H/H 12.3/38.9 which is improved frm prior. Pt is on both B12 and irn and plan to continue both.   [name], MD

## 2015-10-28 DIAGNOSIS — R1312 Dysphagia, oropharyngeal phase: Secondary | ICD-10-CM | POA: Diagnosis not present

## 2015-10-28 DIAGNOSIS — I2510 Atherosclerotic heart disease of native coronary artery without angina pectoris: Secondary | ICD-10-CM | POA: Diagnosis not present

## 2015-11-09 ENCOUNTER — Encounter: Payer: Self-pay | Admitting: Internal Medicine

## 2015-11-09 NOTE — Assessment & Plan Note (Signed)
In 4 /2017 GFR 38, stable from prior with BUN/Cr  32/1.5; will cont to monitor at intervals esp as pt is on lasix

## 2015-11-09 NOTE — Assessment & Plan Note (Signed)
Las t H/H 12.3/38.9 which is improved frm prior. Pt is on both B12 and irn and plan to continue both.

## 2015-11-09 NOTE — Assessment & Plan Note (Addendum)
Size is stable at 3.8 cm;veru doubtful she would be a surgical ; plan to cont BP control, pt is intolerant to satins, not Diabetic; cardiology did not feel that continued monitoring was necessary

## 2015-11-10 ENCOUNTER — Non-Acute Institutional Stay (SKILLED_NURSING_FACILITY): Payer: Medicare Other | Admitting: Internal Medicine

## 2015-11-10 ENCOUNTER — Encounter: Payer: Self-pay | Admitting: Internal Medicine

## 2015-11-10 DIAGNOSIS — B001 Herpesviral vesicular dermatitis: Secondary | ICD-10-CM | POA: Diagnosis not present

## 2015-11-10 NOTE — Progress Notes (Signed)
MRN: [id number] Name: [name]  Sex: female Age: 82 y.o. DOB: December 26, 1917  [place] #:  Facility/Room: [facility] / [address] Level Of Care: SNF Provider: [name], MD Emergency Contacts: Extended Emergency Contact Information Primary Emergency Contact: [name],[name] Address: [address]          [place], [place] [address] [name] of [place] Phone: [phone] Mobile Phone: [phone] Relation: Daughter Secondary Emergency Contact: [name] Address: [place], [place] [address] [name] of [place] Phone: [phone] Work Phone: [phone] Mobile Phone: [phone] Relation: Daughter  Code Status: DNR  Allergies: Nubain [nalbuphine hcl]; Mirtazapine; Pineapple; Statins; and Iodine  Chief Complaint  Patient presents with  . Acute Visit    Acute Visit    HPI: Patient is 82 y.o. female who nursing asked if she could have some abreva for ulcers on her lips for past few days. The pt had said that she didn't want to use any pills.  Past Medical History:  Diagnosis Date  . Abdominal aortic aneurysm ([place])   . Anemia    hx  . Asthma    hx  . Atrial fibrillation ([place])   . Blood transfusion   . Blood transfusion without reported diagnosis   . CAD (coronary artery disease)   . COPD (chronic obstructive pulmonary disease) ([place])   . GERD (gastroesophageal reflux disease)   . GI bleed 2007  . H/O hiatal hernia   . Hypertension   . Hypothyroidism   . Myocardial infarction ([place])   . Osteoporosis   . Pacemaker   . Renal failure, chronic   . SSS (sick sinus syndrome) ([place]) 10/05/2007   Medtronic Adapta  . Stroke ([place])   . TIA (transient ischemic attack)   . UTI (lower urinary tract infection)    hx    Past Surgical History:  Procedure Laterality Date  . CARDIAC CATHETERIZATION    . COLONOSCOPY    . CORONARY STENT PLACEMENT     x9  . FRACTURE SURGERY     femur fx, /w ORIF into HIP- 2008  . NM MYOCAR PERF WALL  MOTION  10/08/2010   mild apical anterior ischemia  . PACEMAKER INSERTION  10/05/2007   Medtronic Adapta  . TOTAL HIP ARTHROPLASTY     planned for 08/27/2011 Left  . TOTAL HIP ARTHROPLASTY  08/27/2011   Procedure: TOTAL HIP ARTHROPLASTY;  Surgeon: Kerin Salen, MD;  Location: [facility];  Service: Orthopedics;  Laterality: Right;  . US ECHOCARDIOGRAPHY  08/23/2011   EF 50%,LA mod. dilated,mild to mod. mitral annular ca+,mod. MR,mild to mod TR,mild to mod. PH,trace AI.      Medication List       Accurate as of 11/10/15  8:27 PM. Always use your most recent med list.          acetaminophen 325 MG tablet Commonly known as:  TYLENOL Take 325 mg by mouth at bedtime.   acetaminophen 325 MG tablet Commonly known as:  TYLENOL Take 650 mg by mouth every 6 (six) hours as needed for mild pain.   amiodarone 200 MG tablet Commonly known as:  PACERONE TAKE 1 TABLET BY MOUTH EVERY DAY   Biotin 1000 MCG tablet Take 1,000 mcg by mouth daily.   CALCIUM 600+D 600-400 MG-UNIT tablet Generic drug:  Calcium Carbonate-Vitamin D Take 1 tablet by mouth daily.   clopidogrel 75 MG tablet Commonly known as:  PLAVIX TAKE 1 TABLET (75  MG TOTAL) BY MOUTH DAILY.   feeding supplement Liqd Take 237 mLs by mouth daily.   ferrous sulfate 325 (65 FE) MG tablet Take 325 mg by mouth daily.   furosemide 20 MG tablet Commonly known as:  LASIX Take 1 tablet (20 mg total) by mouth daily.   levothyroxine 125 MCG tablet Commonly known as:  SYNTHROID, LEVOTHROID Take 125 mcg by mouth daily before breakfast.   nitroGLYCERIN 0.4 MG SL tablet Commonly known as:  NITROSTAT Place 1 tablet (0.4 mg total) under the tongue every 5 (five) minutes as needed for chest pain (For a total of 3 tablets, if chest pain persist to call 911).   OXYGEN Inhale 2 L into the lungs as needed (to keep O2 sat above 90%).   pantoprazole 40 MG tablet Commonly known as:  PROTONIX TAKE 1 TABLET (40 MG TOTAL) BY MOUTH DAILY.    potassium chloride 10 MEQ tablet Commonly known as:  K-DUR Take 10 mEq by mouth 2 (two) times daily with a meal. 9am, 6pm   promethazine 25 MG tablet Commonly known as:  PHENERGAN Take 25 mg by mouth every 6 (six) hours as needed for nausea or vomiting.   rOPINIRole 0.25 MG tablet Commonly known as:  REQUIP Take 0.25 mg by mouth at bedtime.   vitamin B-12 1000 MCG tablet Commonly known as:  CYANOCOBALAMIN Take 1,000 mcg by mouth daily.       No orders of the defined types were placed in this encounter.   Immunization History  Administered Date(s) Administered  . Influenza,inj,Quad PF,36+ Mos 12/18/2013, 12/09/2014  . PPD Test 02/02/2015, 02/27/2015  . Pneumococcal Conjugate-13 12/09/2014    Social History  Substance Use Topics  . Smoking status: Former Smoker    Types: Cigarettes  . Smokeless tobacco: Never Used     Comment: quit 40 yrs ago- was smoking 2 packs a day at the most  . Alcohol use No    Review of Systems  DATA OBTAINED: from patient, nurse GENERAL:  no fevers, fatigue, appetite changes SKIN: No itching, rash HEENT: ulcers inside lower lip RESPIRATORY: No cough, wheezing, SOB CARDIAC: No chest pain, palpitations, lower extremity edema  GI: No abdominal pain, No N/V/D or constipation, No heartburn or reflux  GU: No dysuria, frequency or urgency, or incontinence  MUSCULOSKELETAL: No unrelieved bone/joint pain NEUROLOGIC: No headache, dizziness  PSYCHIATRIC: No overt anxiety or sadness  Vitals:   11/10/15 1412  BP: 120/64  Pulse: 80  Resp: 18  Temp: 97.3 F (36.3 C)    Physical Exam  GENERAL APPEARANCE: Alert, conversant, No acute distress  SKIN: No diaphoresis rash HEENT: healing ilcer on lower lip; 2 ulcers of mucosa of inside lower lip RESPIRATORY: Breathing is even, unlabored. Lung sounds are clear   CARDIOVASCULAR: Heart RRR no murmurs, rubs or gallops. No peripheral edema  GASTROINTESTINAL: Abdomen is soft, non-tender, not distended  w/ normal bowel sounds.  GENITOURINARY: Bladder non tender, not distended  MUSCULOSKELETAL: No abnormal joints or musculature NEUROLOGIC: Cranial nerves 2-12 grossly intact. Moves all extremities PSYCHIATRIC: Mood and affect appropriate to situation, no behavioral issues  Patient Active Problem List   Diagnosis Date Noted  . Abdominal aortic aneurysm (AAA) without rupture ([place]) 08/21/2015  . DNR (do not resuscitate)   . Weakness generalized   . GERD (gastroesophageal reflux disease) 07/04/2015  . Dysphagia 07/04/2015  . Transaminitis 06/24/2015  . HCAP (healthcare-associated pneumonia) 06/22/2015  . Pneumonia 06/22/2015  . COPD exacerbation ([place]) 06/21/2015  . Cough 06/09/2015  .  Fever, unspecified 06/09/2015  . Chronic diastolic congestive heart failure ([place]) 04/24/2015  . Restless legs 04/24/2015  . Restless leg 04/24/2015  . Pain and swelling of left lower extremity 02/15/2015  . Candidiasis of perineum 02/15/2015  . Hemorrhoids 02/15/2015  . CKD (chronic kidney disease) stage 3, GFR 30-59 ml/min 02/06/2015  . Acute respiratory failure with hypoxia ([place]) 01/29/2015  . Acute pulmonary edema ([place]) 01/29/2015  . SOB (shortness of breath)   . Palliative care encounter   . Sepsis ([place]) 01/26/2015  . Cellulitis of leg, left 01/26/2015  . Protein-calorie malnutrition, severe ([place]) 11/06/2014  . Pressure ulcer 11/06/2014  . Acute diastolic CHF (congestive heart failure) ([place]) 11/04/2014  . Weakness 11/04/2014  . Hypertensive heart disease with CHF (congestive heart failure) ([place]) 11/04/2014  . Macrocytic anemia 11/04/2014  . Cardiomyopathy, ischemic 11/04/2014  . CHF exacerbation ([place]) 11/03/2014  . Body mass index (BMI) of 20.0-20.9 in adult 06/23/2014  . Hard of hearing 06/23/2014  . Rectal bleeding 07/16/2013  . Hyperlipidemia, statin intolerance 04/30/2013  . Paroxysmal atrial fibrillation ([place]) 04/30/2013  . Pacemaker - dual chamber Medtronic 2009 04/30/2013  . AAA  (abdominal aortic aneurysm) ([place]) 04/30/2013  . Bilateral carotid artery stenosis 04/30/2013  . PAD (peripheral artery disease) ([place]) 04/30/2013  . Constipation 08/31/2011  . Hypotension 08/28/2011  . S/P total hip arthroplasty 08/28/2011  . CAD (coronary artery disease) 08/28/2011  . Hypothyroid 08/28/2011  . Klebsiella cystitis 08/28/2011  . Thrombocytopenia ([place]) 08/28/2011  . SSS (sick sinus syndrome) (HCC) 10/05/2007    CBC    Component Value Date/Time   WBC 6.7 10/22/2015   WBC 5.8 06/24/2015 0648   RBC 3.73 (L) 06/24/2015 0648   HGB 12.4 10/22/2015   HCT 39 10/22/2015   HCT 38.0 08/01/2014 1401   PLT 172 10/22/2015   PLT 146 (L) 08/01/2014 1401   MCV 97.9 06/24/2015 0648   MCV 102 (H) 08/01/2014 1401   LYMPHSABS 1.2 06/23/2015 0352   LYMPHSABS 1.4 08/01/2014 1401   MONOABS 0.6 06/23/2015 0352   EOSABS 0.1 06/23/2015 0352   EOSABS 0.1 08/01/2014 1401   BASOSABS 0.0 06/23/2015 0352   BASOSABS 0.0 08/01/2014 1401    CMP     Component Value Date/Time   NA 137 10/22/2015   K 4.3 10/22/2015   CL 106 06/26/2015 0540   CO2 27 06/26/2015 0540   GLUCOSE 107 (H) 06/26/2015 0540   BUN 28 (A) 10/22/2015   CREATININE 1.5 (A) 10/22/2015   CREATININE 1.17 (H) 06/26/2015 0540   CREATININE 1.16 (H) 01/22/2015 1203   CALCIUM 8.5 (L) 06/26/2015 0540   PROT 4.9 (L) 06/24/2015 0648   PROT 6.0 08/01/2014 1401   ALBUMIN 2.1 (L) 06/26/2015 0540   ALBUMIN 3.9 08/01/2014 1401   AST 88 (H) 06/24/2015 0648   ALT 57 (H) 06/24/2015 0648   ALKPHOS 73 06/24/2015 0648   BILITOT 0.7 06/24/2015 0648   BILITOT 0.3 08/01/2014 1401   GFRNONAA 38 (L) 06/26/2015 0540   GFRAA 44 (L) 06/26/2015 0540    Assessment and Plan  Herpes labialis - I told mrs Dettmann that ointment would not help her for inside the mouth ulcer; she needs to let us know next time she has on on her lip and we can use abreva; this time she needs to use valtrex 2000 mg now and 2000 mg in 12 hours    Foy Vanduyne D.  Sheppard Coil, MD

## 2015-11-11 DIAGNOSIS — I509 Heart failure, unspecified: Secondary | ICD-10-CM | POA: Diagnosis not present

## 2015-11-11 DIAGNOSIS — Z79899 Other long term (current) drug therapy: Secondary | ICD-10-CM | POA: Diagnosis not present

## 2015-11-11 DIAGNOSIS — I2510 Atherosclerotic heart disease of native coronary artery without angina pectoris: Secondary | ICD-10-CM | POA: Diagnosis not present

## 2015-11-12 LAB — CBC AND DIFFERENTIAL
HCT: 38 % (ref 36–46)
HEMOGLOBIN: 12 g/dL (ref 12.0–16.0)
Platelets: 175 10*3/uL (ref 150–399)
WBC: 6.1 10^3/mL

## 2015-11-12 LAB — LIPID PANEL
CHOLESTEROL: 133 mg/dL (ref 0–200)
HDL: 38 mg/dL (ref 35–70)
LDL Cholesterol: 68 mg/dL
Triglycerides: 137 mg/dL (ref 40–160)

## 2015-11-12 LAB — HEPATIC FUNCTION PANEL
ALT: 36 U/L — AB (ref 7–35)
AST: 52 U/L — AB (ref 13–35)
Alkaline Phosphatase: 80 U/L (ref 25–125)
Bilirubin, Total: 0.6 mg/dL

## 2015-11-12 LAB — HEMOGLOBIN A1C: HEMOGLOBIN A1C: 5.7

## 2015-11-12 LAB — BASIC METABOLIC PANEL
BUN: 27 mg/dL — AB (ref 4–21)
Creatinine: 1.4 mg/dL — AB (ref 0.5–1.1)
Glucose: 140 mg/dL
POTASSIUM: 4.8 mmol/L (ref 3.4–5.3)
SODIUM: 137 mmol/L (ref 137–147)

## 2015-11-12 LAB — TSH: TSH: 0.52 u[IU]/mL (ref 0.41–5.90)

## 2015-11-17 NOTE — Progress Notes (Signed)
This encounter was created in error - please disregard.

## 2015-11-18 ENCOUNTER — Encounter: Payer: Self-pay | Admitting: Internal Medicine

## 2015-11-18 ENCOUNTER — Telehealth: Payer: Self-pay | Admitting: Cardiology

## 2015-11-18 ENCOUNTER — Ambulatory Visit: Payer: Medicare Other | Admitting: *Deleted

## 2015-11-18 ENCOUNTER — Non-Acute Institutional Stay (SKILLED_NURSING_FACILITY): Payer: Medicare Other | Admitting: Internal Medicine

## 2015-11-18 DIAGNOSIS — R05 Cough: Secondary | ICD-10-CM

## 2015-11-18 DIAGNOSIS — J189 Pneumonia, unspecified organism: Secondary | ICD-10-CM | POA: Diagnosis not present

## 2015-11-18 DIAGNOSIS — R058 Other specified cough: Secondary | ICD-10-CM

## 2015-11-18 NOTE — Progress Notes (Signed)
MRN: [id number] Name: [name]  Sex: female Age: 82 y.o. DOB: 08-15-1917  [place] #:  Facility/Room: [facility] / [address] Level Of Care: SNF Provider: [name], MD Emergency Contacts: Extended Emergency Contact Information Primary Emergency Contact: [name],[name] Address: [address]          [place], [place] [address] [name] of [place] Phone: [phone] Mobile Phone: [phone] Relation: Daughter Secondary Emergency Contact: [name] Address: [place],  [address] [name] of [place] Phone: [phone] Work Phone: [phone] Mobile Phone: [phone] Relation: Daughter  Code Status: DNR  Allergies: Nubain [nalbuphine hcl]; Mirtazapine; Pineapple; Statins; and Iodine  Chief Complaint  Patient presents with  . Acute Visit    Acute    HPI: Patient is 82 y.o. female who is being seen for a cold and cough which started maybe 3 days ago. Productive of brown sputum. Admits to some SOB with activity. Denies CP. Pt had a cough productive of clear sputum about 3 weeks ago that cleared up.Pt has had no fever or CP. Symptoms are not better or worse with anything.  Past Medical History:  Diagnosis Date  . Abdominal aortic aneurysm ([place])   . Anemia    hx  . Asthma    hx  . Atrial fibrillation ([place])   . Blood transfusion   . Blood transfusion without reported diagnosis   . CAD (coronary artery disease)   . COPD (chronic obstructive pulmonary disease) ([place])   . GERD (gastroesophageal reflux disease)   . GI bleed 2007  . H/O hiatal hernia   . Hypertension   . Hypothyroidism   . Myocardial infarction ([place])   . Osteoporosis   . Pacemaker   . Renal failure, chronic   . SSS (sick sinus syndrome) ([place]) 10/05/2007   Medtronic Adapta  . Stroke ([place])   . TIA (transient ischemic attack)   . UTI (lower urinary tract infection)    hx    Past Surgical History:  Procedure Laterality Date  . CARDIAC  CATHETERIZATION    . COLONOSCOPY    . CORONARY STENT PLACEMENT     x9  . FRACTURE SURGERY     femur fx, /w ORIF into HIP- 2008  . NM MYOCAR PERF WALL MOTION  10/08/2010   mild apical anterior ischemia  . PACEMAKER INSERTION  10/05/2007   Medtronic Adapta  . TOTAL HIP ARTHROPLASTY     planned for 08/27/2011 Left  . TOTAL HIP ARTHROPLASTY  08/27/2011   Procedure: TOTAL HIP ARTHROPLASTY;  Surgeon: Kerin Salen, MD;  Location: [facility];  Service: Orthopedics;  Laterality: Right;  . US ECHOCARDIOGRAPHY  08/23/2011   EF 50%,LA mod. dilated,mild to mod. mitral annular ca+,mod. MR,mild to mod TR,mild to mod. PH,trace AI.      Medication List       Accurate as of 11/18/15 11:59 PM. Always use your most recent med list.          acetaminophen 325 MG tablet Commonly known as:  TYLENOL Take 325 mg by mouth at bedtime.   acetaminophen 325 MG tablet Commonly known as:  TYLENOL Take 650 mg by mouth every 6 (six) hours as needed for mild pain.   amiodarone 200 MG tablet Commonly known as:  PACERONE TAKE 1 TABLET BY MOUTH EVERY DAY   Biotin 1000 MCG tablet Take 1,000 mcg by mouth daily.   CALCIUM 600+D 600-400 MG-UNIT tablet Generic drug:  Calcium Carbonate-Vitamin D Take 1 tablet by mouth daily.   clopidogrel 75 MG tablet Commonly known as:  PLAVIX TAKE 1 TABLET (75 MG TOTAL) BY MOUTH DAILY.   feeding supplement Liqd Take 237 mLs by mouth daily.   ferrous sulfate 325 (65 FE) MG tablet Take 325 mg by mouth daily.   furosemide 20 MG tablet Commonly known as:  LASIX Take 1 tablet (20 mg total) by mouth daily.   levothyroxine 125 MCG tablet Commonly known as:  SYNTHROID, LEVOTHROID Take 125 mcg by mouth daily before breakfast.   nitroGLYCERIN 0.4 MG SL tablet Commonly known as:  NITROSTAT Place 1 tablet (0.4 mg total) under the tongue every 5 (five) minutes as needed for chest pain (For a total of 3 tablets, if chest pain persist to call 911).   OXYGEN Inhale 2 L into the  lungs as needed (to keep O2 sat above 90%).   pantoprazole 40 MG tablet Commonly known as:  PROTONIX TAKE 1 TABLET (40 MG TOTAL) BY MOUTH DAILY.   potassium chloride 10 MEQ tablet Commonly known as:  K-DUR Take 10 mEq by mouth 2 (two) times daily with a meal. 9am, 6pm   promethazine 25 MG tablet Commonly known as:  PHENERGAN Take 25 mg by mouth every 6 (six) hours as needed for nausea or vomiting.   rOPINIRole 0.25 MG tablet Commonly known as:  REQUIP Take 0.25 mg by mouth at bedtime.   vitamin B-12 1000 MCG tablet Commonly known as:  CYANOCOBALAMIN Take 1,000 mcg by mouth daily.       No orders of the defined types were placed in this encounter.   Immunization History  Administered Date(s) Administered  . Influenza,inj,Quad PF,36+ Mos 12/18/2013, 12/09/2014  . PPD Test 02/02/2015, 02/27/2015  . Pneumococcal Conjugate-13 12/09/2014    Social History  Substance Use Topics  . Smoking status: Former Smoker    Types: Cigarettes  . Smokeless tobacco: Never Used     Comment: quit 40 yrs ago- was smoking 2 packs a day at the most  . Alcohol use No    Review of Systems  DATA OBTAINED: from patient, nurse GENERAL:  no fevers, fatigue, appetite changes SKIN: No itching, rash HEENT: No complaint RESPIRATORY: + cough, no wheezing, +SOB with movement CARDIAC: No chest pain, palpitations, lower extremity edema  GI: No abdominal pain, No N/V/D or constipation, No heartburn or reflux  GU: No dysuria, frequency or urgency, or incontinence  MUSCULOSKELETAL: No unrelieved bone/joint pain NEUROLOGIC: No headache, dizziness  PSYCHIATRIC: No overt anxiety or sadness  Vitals:   11/18/15 1330  BP: 104/65  Pulse: 93  Resp: (!) 22  Temp: 98.6 F (37 C)    Physical Exam  GENERAL APPEARANCE: Alert, conversant, No acute distress  SKIN: No diaphoresis rash HEENT: Unremarkable RESPIRATORY: Breathing is even, unlabored. Lung sounds are slt rhonchi, no wheezes or rales RA O2  sat 95-96%   CARDIOVASCULAR: Heart RRR no murmurs, rubs or gallops. No peripheral edema  GASTROINTESTINAL: Abdomen is soft, non-tender, not distended w/ normal bowel sounds.  GENITOURINARY: Bladder non tender, not distended  MUSCULOSKELETAL: No abnormal joints or musculature NEUROLOGIC: Cranial nerves 2-12 grossly intact. Moves all extremities PSYCHIATRIC: Mood and affect appropriate to situation, no behavioral issues  Patient Active Problem List   Diagnosis Date Noted  . Abdominal aortic aneurysm (AAA) without rupture ([place]) 08/21/2015  . DNR (do not resuscitate)   . Weakness generalized   . GERD (gastroesophageal reflux disease) 07/04/2015  . Dysphagia 07/04/2015  .  Transaminitis 06/24/2015  . HCAP (healthcare-associated pneumonia) 06/22/2015  . Pneumonia 06/22/2015  . COPD exacerbation ([place]) 06/21/2015  . Cough 06/09/2015  . Fever, unspecified 06/09/2015  . Chronic diastolic congestive heart failure ([place]) 04/24/2015  . Restless legs 04/24/2015  . Restless leg 04/24/2015  . Pain and swelling of left lower extremity 02/15/2015  . Candidiasis of perineum 02/15/2015  . Hemorrhoids 02/15/2015  . CKD (chronic kidney disease) stage 3, GFR 30-59 ml/min 02/06/2015  . Acute respiratory failure with hypoxia ([place]) 01/29/2015  . Acute pulmonary edema ([place]) 01/29/2015  . SOB (shortness of breath)   . Palliative care encounter   . Sepsis ([place]) 01/26/2015  . Cellulitis of leg, left 01/26/2015  . Protein-calorie malnutrition, severe ([place]) 11/06/2014  . Pressure ulcer 11/06/2014  . Acute diastolic CHF (congestive heart failure) ([place]) 11/04/2014  . Weakness 11/04/2014  . Hypertensive heart disease with CHF (congestive heart failure) ([place]) 11/04/2014  . Macrocytic anemia 11/04/2014  . Cardiomyopathy, ischemic 11/04/2014  . CHF exacerbation ([place]) 11/03/2014  . Body mass index (BMI) of 20.0-20.9 in adult 06/23/2014  . Hard of hearing 06/23/2014  . Rectal bleeding 07/16/2013  .  Hyperlipidemia, statin intolerance 04/30/2013  . Paroxysmal atrial fibrillation ([place]) 04/30/2013  . Pacemaker - dual chamber Medtronic 2009 04/30/2013  . AAA (abdominal aortic aneurysm) ([place]) 04/30/2013  . Bilateral carotid artery stenosis 04/30/2013  . PAD (peripheral artery disease) ([place]) 04/30/2013  . Constipation 08/31/2011  . Hypotension 08/28/2011  . S/P total hip arthroplasty 08/28/2011  . CAD (coronary artery disease) 08/28/2011  . Hypothyroid 08/28/2011  . Klebsiella cystitis 08/28/2011  . Thrombocytopenia ([place]) 08/28/2011  . SSS (sick sinus syndrome) ([facility]) 10/05/2007    CBC    Component Value Date/Time   WBC 6.7 10/22/2015   WBC 5.8 06/24/2015 0648   RBC 3.73 (L) 06/24/2015 0648   HGB 12.4 10/22/2015   HCT 39 10/22/2015   HCT 38.0 08/01/2014 1401   PLT 172 10/22/2015   PLT 146 (L) 08/01/2014 1401   MCV 97.9 06/24/2015 0648   MCV 102 (H) 08/01/2014 1401   LYMPHSABS 1.2 06/23/2015 0352   LYMPHSABS 1.4 08/01/2014 1401   MONOABS 0.6 06/23/2015 0352   EOSABS 0.1 06/23/2015 0352   EOSABS 0.1 08/01/2014 1401   BASOSABS 0.0 06/23/2015 0352   BASOSABS 0.0 08/01/2014 1401    CMP     Component Value Date/Time   NA 137 10/22/2015   K 4.3 10/22/2015   CL 106 06/26/2015 0540   CO2 27 06/26/2015 0540   GLUCOSE 107 (H) 06/26/2015 0540   BUN 28 (A) 10/22/2015   CREATININE 1.5 (A) 10/22/2015   CREATININE 1.17 (H) 06/26/2015 0540   CREATININE 1.16 (H) 01/22/2015 1203   CALCIUM 8.5 (L) 06/26/2015 0540   PROT 4.9 (L) 06/24/2015 0648   PROT 6.0 08/01/2014 1401   ALBUMIN 2.1 (L) 06/26/2015 0540   ALBUMIN 3.9 08/01/2014 1401   AST 88 (H) 06/24/2015 0648   ALT 57 (H) 06/24/2015 0648   ALKPHOS 73 06/24/2015 0648   BILITOT 0.7 06/24/2015 0648   BILITOT 0.3 08/01/2014 1401   GFRNONAA 38 (L) 06/26/2015 0540   GFRAA 44 (L) 06/26/2015 0540    Assessment and Plan  RECURRENT COUGH/ PNA- have written for CXR; have started Robitussin DM 3 tsp TID, Albuterol nebs BID  Late  entry- CXR returns with probable small  R side infiltrate; Pt is started on renal dose levaquin with CrCl 26 of 750 mg q 48 hours for 4 doses total  Time spent> 35 min;> 50% of time with patient was spent reviewing records, labs, tests and studies, counseling and developing plan of care  [name], MD

## 2015-11-18 NOTE — Telephone Encounter (Signed)
Confirmed remote transmission w/ pt son.    

## 2015-11-19 DIAGNOSIS — R0989 Other specified symptoms and signs involving the circulatory and respiratory systems: Secondary | ICD-10-CM | POA: Diagnosis not present

## 2015-11-19 NOTE — Progress Notes (Addendum)
Remote pacemaker transmission. No received

## 2015-11-20 ENCOUNTER — Encounter: Payer: Self-pay | Admitting: Internal Medicine

## 2015-11-21 ENCOUNTER — Encounter: Payer: Self-pay | Admitting: Cardiology

## 2015-11-24 ENCOUNTER — Ambulatory Visit (INDEPENDENT_AMBULATORY_CARE_PROVIDER_SITE_OTHER): Payer: Medicare Other | Admitting: *Deleted

## 2015-11-24 DIAGNOSIS — I495 Sick sinus syndrome: Secondary | ICD-10-CM | POA: Diagnosis not present

## 2015-11-25 ENCOUNTER — Encounter: Payer: Self-pay | Admitting: Internal Medicine

## 2015-11-25 ENCOUNTER — Non-Acute Institutional Stay (SKILLED_NURSING_FACILITY): Payer: Medicare Other | Admitting: Internal Medicine

## 2015-11-25 DIAGNOSIS — J449 Chronic obstructive pulmonary disease, unspecified: Secondary | ICD-10-CM | POA: Diagnosis not present

## 2015-11-25 DIAGNOSIS — I2510 Atherosclerotic heart disease of native coronary artery without angina pectoris: Secondary | ICD-10-CM

## 2015-11-25 DIAGNOSIS — I480 Paroxysmal atrial fibrillation: Secondary | ICD-10-CM | POA: Diagnosis not present

## 2015-11-25 NOTE — Progress Notes (Signed)
Location:  [facility] Room Number: [place] of Service:  SNF (31)  [name]. Brandy Coil, MD  Patient Care Team: No Pcp Per Patient as PCP - General (General Practice) Gayland Curry, DO as Consulting Physician (Geriatric Medicine)  Extended Emergency Contact Information Primary Emergency Contact: [name] Address: [address]          [place], [place] [address] [name] of [place] Phone: [phone] Mobile Phone: [phone] Relation: Daughter Secondary Emergency Contact: [name] Address: [place], [place] [address] [name] of [place] Phone: [phone] Work Phone: [phone] Mobile Phone: [phone] Relation: Daughter   Chief Complaint  Patient presents with  . Medical Management of Chronic Issues    Routine Visit    HPI:  Pt is a 82 y.o. female seen today for routine issues of CAD, AF and COPD.     Past Medical History:  Diagnosis Date  . Abdominal aortic aneurysm ([place])   . Anemia    hx  . Asthma    hx  . Atrial fibrillation ([place])   . Blood transfusion   . Blood transfusion without reported diagnosis   . CAD (coronary artery disease)   . COPD (chronic obstructive pulmonary disease) ([place])   . GERD (gastroesophageal reflux disease)   . GI bleed 2007  . H/O hiatal hernia   . Hypertension   . Hypothyroidism   . Myocardial infarction ([facility] [facility])   . Osteoporosis   . Pacemaker   . Renal failure, chronic   . SSS (sick sinus syndrome) ([place]) 10/05/2007   Medtronic Adapta  . Stroke ([place])   . TIA (transient ischemic attack)   . UTI (lower urinary tract infection)    hx   Past Surgical History:  Procedure Laterality Date  . CARDIAC CATHETERIZATION    . COLONOSCOPY    . CORONARY STENT PLACEMENT     x9  . FRACTURE SURGERY     femur fx, /w ORIF into HIP- 2008  . NM MYOCAR PERF WALL MOTION  10/08/2010   mild apical anterior ischemia  . PACEMAKER INSERTION  10/05/2007   Medtronic Adapta  . TOTAL HIP ARTHROPLASTY     planned for 08/27/2011 Left  . TOTAL HIP ARTHROPLASTY  08/27/2011   Procedure: TOTAL HIP ARTHROPLASTY;  Surgeon: Kerin Salen, MD;  Location: [facility];  Service: Orthopedics;  Laterality: Right;  . US ECHOCARDIOGRAPHY  08/23/2011   EF 50%,LA mod. dilated,mild to mod. mitral annular ca+,mod. MR,mild to mod TR,mild to mod. PH,trace AI.    Allergies  Allergen Reactions  . Nubain [Nalbuphine Hcl] Anaphylaxis  . Mirtazapine Other (See Comments)    "Weakness in legs"  . Pineapple Other (See Comments)    Listed on [facility] 06/2015  . Statins Other (See Comments)    Leg weakness  . Iodine Rash      Medication List       Accurate as of 11/25/15 11:59 PM. Always use your most recent med list.          acetaminophen 325 MG tablet Commonly known as:  TYLENOL Take 325 mg by mouth at bedtime.   acetaminophen 325 MG tablet Commonly known as:  TYLENOL Take 650 mg by mouth every 6 (six) hours as needed for mild pain.   amiodarone 200 MG tablet Commonly known as:  PACERONE TAKE 1 TABLET BY MOUTH EVERY DAY   Biotin 1000 MCG tablet Take 1,000  mcg by mouth daily.   CALCIUM 600+D 600-400 MG-UNIT tablet Generic drug:  Calcium Carbonate-Vitamin D Take 1 tablet by mouth daily.   clopidogrel 75 MG tablet Commonly known as:  PLAVIX TAKE 1 TABLET (75 MG TOTAL) BY MOUTH DAILY.   feeding supplement Liqd Take 237 mLs by mouth daily.   ferrous sulfate 325 (65 FE) MG tablet Take 325 mg by mouth daily.   furosemide 20 MG tablet Commonly known as:  LASIX Take 1 tablet (20 mg total) by mouth daily.   guaiFENesin 600 MG 12 hr tablet Commonly known as:  MUCINEX Take 600 mg by mouth 2 (two) times daily. Stop date 12/02/15   guaiFENesin-dextromethorphan 100-10 MG/5ML syrup Commonly known as:  ROBITUSSIN DM 3 tsp by mouth three times a day scheduled for 10 days. Stop date 11/29/15   levothyroxine 125 MCG tablet Commonly known as:  SYNTHROID,  LEVOTHROID Take 125 mcg by mouth daily before breakfast.   nitroGLYCERIN 0.4 MG SL tablet Commonly known as:  NITROSTAT Place 1 tablet (0.4 mg total) under the tongue every 5 (five) minutes as needed for chest pain (For a total of 3 tablets, if chest pain persist to call 911).   OXYGEN Inhale 2 L into the lungs as needed (to keep O2 sat above 90%).   pantoprazole 40 MG tablet Commonly known as:  PROTONIX TAKE 1 TABLET (40 MG TOTAL) BY MOUTH DAILY.   potassium chloride 10 MEQ tablet Commonly known as:  K-DUR Take 10 mEq by mouth 2 (two) times daily with a meal. 9am, 6pm   promethazine 25 MG tablet Commonly known as:  PHENERGAN Take 25 mg by mouth every 6 (six) hours as needed for nausea or vomiting.   rOPINIRole 0.25 MG tablet Commonly known as:  REQUIP Take 0.25 mg by mouth at bedtime.   vitamin B-12 1000 MCG tablet Commonly known as:  CYANOCOBALAMIN Take 1,000 mcg by mouth daily.       Review of Systems  DATA OBTAINED: from patient GENERAL:  no fevers, fatigue, appetite changes SKIN: No itching, rash HEENT: No complaint RESPIRATORY: No cough, wheezing, SOB CARDIAC: No chest pain, palpitations, lower extremity edema  GI: No abdominal pain, No N/V/D or constipation, No heartburn or reflux  GU: No dysuria, frequency or urgency, or incontinence  MUSCULOSKELETAL: No unrelieved bone/joint pain NEUROLOGIC: No headache, dizziness  PSYCHIATRIC: No overt anxiety or sadness   Immunization History  Administered Date(s) Administered  . Influenza,inj,Quad PF,36+ Mos 12/18/2013, 12/09/2014  . PPD Test 02/02/2015, 02/27/2015  . Pneumococcal Conjugate-13 12/09/2014   Pertinent  Health Maintenance Due  Topic Date Due  . INFLUENZA VACCINE  12/14/2015 (Originally 10/14/2015)  . PNA vac Low Risk Adult (2 of 2 - PPSV23) 12/09/2015  . DEXA SCAN  Completed   Fall Risk  12/09/2014 10/15/2014 09/12/2014 08/01/2014  Falls in the past year? No No No Yes  Number falls in past yr: - - - 1   Injury with Fall? - - - Yes    Vitals:   11/25/15 0844  BP: 120/64  Pulse: 83  Resp: 18  Temp: 98.6 F (37 C)  Weight: 108 lb 6.4 oz (49.2 kg)  Height: 5\' 1"  (1.549 m)   Body mass index is 20.48 kg/m.  Physical Exam  GENERAL APPEARANCE: Alert, conversant, No acute distress  SKIN: No diaphoresis rash HEENT: Unremarkable RESPIRATORY: Breathing is even, unlabored. Lung sounds are slt rhonchi, good AF   CARDIOVASCULAR: Heart RRR no murmurs, rubs or gallops. No peripheral edema  GASTROINTESTINAL: Abdomen is soft, non-tender, not distended w/ normal bowel sounds.  GENITOURINARY: Bladder non tender, not distended  MUSCULOSKELETAL: No abnormal joints or musculature NEUROLOGIC: Cranial nerves 2-12 grossly intact. Moves all extremities PSYCHIATRIC: Mood and affect appropriate to situation, no behavioral issues  Labs reviewed:  Recent Labs  01/30/15 0530  06/23/15 0352 06/24/15 0648 06/26/15 0540 10/22/15 11/12/15  NA  --   < > 144 143 141 137 137  K  --   < > 4.6 4.4 4.4 4.3 4.8  CL  --   < > 105 107 106  --   --   CO2  --   < > 32 29 27  --   --   GLUCOSE  --   < > 88 80 107*  --   --   BUN  --   < > 27* 18 12 28* 27*  CREATININE  --   < > 1.50* 1.14* 1.17* 1.5* 1.4*  CALCIUM  --   < > 8.2* 8.5* 8.5*  --   --   MG 1.4*  --   --   --   --   --   --   PHOS  --   --   --   --  2.1*  --   --   < > = values in this interval not displayed.  Recent Labs  06/22/15 1609 06/23/15 0352 06/24/15 0648 06/26/15 0540 11/12/15  AST 123* 98* 88*  --  52*  ALT 71* 60* 57*  --  36*  ALKPHOS 89 66 73  --  80  BILITOT 0.8 0.9 0.7  --   --   PROT 5.8* 4.8* 4.9*  --   --   ALBUMIN 2.6* 2.1* 2.2* 2.1*  --     Recent Labs  01/26/15 1030  06/22/15 1609 06/23/15 0352 06/24/15 0648 10/22/15 11/12/15  WBC 30.4*  < > 8.7 6.2 5.8 6.7 6.1  NEUTROABS 28.6*  --  6.6 4.3  --   --   --   HGB 11.6*  < > 12.3 10.7* 11.1* 12.4 12.0  HCT 36.9  < > 38.9 34.5* 36.5 39 38  MCV 98.9  < > 96.8  96.6 97.9  --   --   PLT 157  < > 197 178 185 172 175  < > = values in this interval not displayed.  Recent Labs  11/12/15  CHOL 133  LDLCALC 68  TRIG 137   No results found for: [facility] Lab Results  Component Value Date   TSH 0.52 11/12/2015   Lab Results  Component Value Date   HGBA1C 5.7 11/12/2015   Lab Results  Component Value Date   CHOL 133 11/12/2015   HDL 38 11/12/2015   LDLCALC 68 11/12/2015   TRIG 137 11/12/2015   CHOLHDL 3.3 12/01/2012    Significant Diagnostic Results in last 30 days:  No results found.    CAD - Pt has had no reported CP or equivalents; not on ASA, on plavix for stents.AF ; has prn NTG, not used recently; cont to monitor status  AF-No reported or noted episodes of AF; plan to cont amiodarone 200 mg daily ; not on coags but is on plavix 2/2 stents  COPD - Recently with PNA and pt was treated with nebs but wheezing was not a major part of the illness and steroids were not required; stable    Brandy Kingsford D. Brandy Coil, MD

## 2015-11-25 NOTE — Progress Notes (Signed)
Remote pacemaker transmission.   

## 2015-11-27 ENCOUNTER — Encounter: Payer: Self-pay | Admitting: Cardiology

## 2015-11-29 ENCOUNTER — Encounter: Payer: Self-pay | Admitting: Internal Medicine

## 2015-11-29 DIAGNOSIS — J449 Chronic obstructive pulmonary disease, unspecified: Secondary | ICD-10-CM | POA: Insufficient documentation

## 2015-11-29 NOTE — Assessment & Plan Note (Addendum)
Recently with PNA and pt was treated with nebs but wheezing was not a major part of the illness and steroids were not required; stable

## 2015-11-29 NOTE — Assessment & Plan Note (Signed)
No reported or noted episodes of AF; plan to cont amiodarone 200 mg daily ; not on coags but is on plavix 2/2 stents

## 2015-11-29 NOTE — Assessment & Plan Note (Signed)
Pt has had no reported CP or equivalents; not on ASA, on plavix for stents.AF ; has prn NTG, not used recently; cont to monitor status

## 2015-12-02 ENCOUNTER — Encounter: Payer: Self-pay | Admitting: Internal Medicine

## 2015-12-02 ENCOUNTER — Non-Acute Institutional Stay (SKILLED_NURSING_FACILITY): Payer: Medicare Other | Admitting: Internal Medicine

## 2015-12-02 DIAGNOSIS — B001 Herpesviral vesicular dermatitis: Secondary | ICD-10-CM | POA: Diagnosis not present

## 2015-12-02 NOTE — Progress Notes (Signed)
MRN: [id number] Name: [name]  Sex: female Age: 82 y.o. DOB: 1917/10/26  [place] #:  Facility/Room: [facility] / [address] Level Of Care: SNF Provider: [name], MD Emergency Contacts: Extended Emergency Contact Information Primary Emergency Contact: [name],[name] Address: [address]          [place], [place] [address] [name] of [place] Phone: [phone] Mobile Phone: [phone] Relation: Daughter Secondary Emergency Contact: [name] Address: [place], [place] [address] [name] of [place] Phone: [phone] Work Phone: [phone] Mobile Phone: [phone] Relation: Daughter  Code Status: DNR  Allergies: Nubain [nalbuphine hcl]; Mirtazapine; Pineapple; Statins; and Iodine  Chief Complaint  Patient presents with  . Acute Visit    Acute    HPI: Patient is 82 y.o. female who c/o fever blister on R lip. Pt has had a series of lesions on lip and inside gums lately that have impr0ved with valtrex but cont to recur. No systemic symptoms are associated with these. No trauma to mouth or teeth to gum.  Past Medical History:  Diagnosis Date  . Abdominal aortic aneurysm ([place])   . Anemia    hx  . Asthma    hx  . Atrial fibrillation ([place])   . Blood transfusion   . Blood transfusion without reported diagnosis   . CAD (coronary artery disease)   . COPD (chronic obstructive pulmonary disease) ([place])   . GERD (gastroesophageal reflux disease)   . GI bleed 2007  . H/O hiatal hernia   . Hypertension   . Hypothyroidism   . Myocardial infarction ([place])   . Osteoporosis   . Pacemaker   . Renal failure, chronic   . SSS (sick sinus syndrome) ([place]) 10/05/2007   Medtronic Adapta  . Stroke ([place])   . TIA (transient ischemic attack)   . UTI (lower urinary tract infection)    hx    Past Surgical History:  Procedure Laterality Date  . CARDIAC CATHETERIZATION    . COLONOSCOPY    . CORONARY STENT PLACEMENT     x9  .  FRACTURE SURGERY     femur fx, /w ORIF into HIP- 2008  . NM MYOCAR PERF WALL MOTION  10/08/2010   mild apical anterior ischemia  . PACEMAKER INSERTION  10/05/2007   Medtronic Adapta  . TOTAL HIP ARTHROPLASTY     planned for 08/27/2011 Left  . TOTAL HIP ARTHROPLASTY  08/27/2011   Procedure: TOTAL HIP ARTHROPLASTY;  Surgeon: Kerin Salen, MD;  Location: [facility];  Service: Orthopedics;  Laterality: Right;  . US ECHOCARDIOGRAPHY  08/23/2011   EF 50%,LA mod. dilated,mild to mod. mitral annular ca+,mod. MR,mild to mod TR,mild to mod. PH,trace AI.      Medication List       Accurate as of 12/02/15  3:29 PM. Always use your most recent med list.          acetaminophen 325 MG tablet Commonly known as:  TYLENOL Take 325 mg by mouth at bedtime.   acetaminophen 325 MG tablet Commonly known as:  TYLENOL Take 650 mg by mouth every 6 (six) hours as needed for mild pain.   amiodarone 200 MG tablet Commonly known as:  PACERONE TAKE 1 TABLET BY MOUTH EVERY DAY   Biotin 1000 MCG tablet Take 1,000 mcg by mouth daily.   CALCIUM 600+D 600-400 MG-UNIT tablet Generic drug:  Calcium Carbonate-Vitamin D Take 1 tablet by mouth daily.  clopidogrel 75 MG tablet Commonly known as:  PLAVIX TAKE 1 TABLET (75 MG TOTAL) BY MOUTH DAILY.   feeding supplement Liqd Take 237 mLs by mouth daily.   ferrous sulfate 325 (65 FE) MG tablet Take 325 mg by mouth daily.   furosemide 20 MG tablet Commonly known as:  LASIX Take 1 tablet (20 mg total) by mouth daily.   levothyroxine 125 MCG tablet Commonly known as:  SYNTHROID, LEVOTHROID Take 125 mcg by mouth daily before breakfast.   nitroGLYCERIN 0.4 MG SL tablet Commonly known as:  NITROSTAT Place 1 tablet (0.4 mg total) under the tongue every 5 (five) minutes as needed for chest pain (For a total of 3 tablets, if chest pain persist to call 911).   OXYGEN Inhale 2 L into the lungs as needed (to keep O2 sat above 90%).   pantoprazole 40 MG  tablet Commonly known as:  PROTONIX TAKE 1 TABLET (40 MG TOTAL) BY MOUTH DAILY.   potassium chloride 10 MEQ tablet Commonly known as:  K-DUR Take 10 mEq by mouth 2 (two) times daily with a meal. 9am, 6pm   promethazine 25 MG tablet Commonly known as:  PHENERGAN Take 25 mg by mouth every 6 (six) hours as needed for nausea or vomiting.   rOPINIRole 0.25 MG tablet Commonly known as:  REQUIP Take 0.25 mg by mouth at bedtime.   vitamin B-12 1000 MCG tablet Commonly known as:  CYANOCOBALAMIN Take 1,000 mcg by mouth daily.       No orders of the defined types were placed in this encounter.   Immunization History  Administered Date(s) Administered  . Influenza,inj,Quad PF,36+ Mos 12/18/2013, 12/09/2014  . PPD Test 02/02/2015, 02/27/2015  . Pneumococcal Conjugate-13 12/09/2014    Social History  Substance Use Topics  . Smoking status: Former Smoker    Types: Cigarettes  . Smokeless tobacco: Never Used     Comment: quit 40 yrs ago- was smoking 2 packs a day at the most  . Alcohol use No    Review of Systems  DATA OBTAINED: from patient, daughter GENERAL:  no fevers, fatigue, appetite changes SKIN: fever blister on lips and inside mouth as per HPI HEENT: No complaint RESPIRATORY: No cough, wheezing, SOB CARDIAC: No chest pain, palpitations, lower extremity edema  GI: No abdominal pain, No N/V/D or constipation, No heartburn or reflux  GU: No dysuria, frequency or urgency, or incontinence  MUSCULOSKELETAL: No unrelieved bone/joint pain NEUROLOGIC: No headache, dizziness  PSYCHIATRIC: No overt anxiety or sadness  Vitals:   12/02/15 1523  BP: 118/69  Pulse: 71  Resp: 16  Temp: 97.1 F (36.2 C)    Physical Exam  GENERAL APPEARANCE: Alert, conversant, No acute distress  SKIN: No diaphoresis rash HEENT: crusty lesion R lower lip where pt has had a lesion recently RESPIRATORY: Breathing is even, unlabored. Lung sounds are clear   CARDIOVASCULAR: Heart RRR no  murmurs, rubs or gallops. No peripheral edema  GASTROINTESTINAL: Abdomen is soft, non-tender, not distended w/ normal bowel sounds.  GENITOURINARY: Bladder non tender, not distended  MUSCULOSKELETAL: No abnormal joints or musculature NEUROLOGIC: Cranial nerves 2-12 grossly intact. Moves all extremities PSYCHIATRIC: Mood and affect appropriate to situation, no behavioral issues  Patient Active Problem List   Diagnosis Date Noted  . COPD (chronic obstructive pulmonary disease) ([place]) 11/29/2015  . Abdominal aortic aneurysm (AAA) without rupture ([place]) 08/21/2015  . DNR (do not resuscitate)   . Weakness generalized   . GERD (gastroesophageal reflux disease) 07/04/2015  .  Dysphagia 07/04/2015  . Transaminitis 06/24/2015  . HCAP (healthcare-associated pneumonia) 06/22/2015  . Pneumonia 06/22/2015  . COPD exacerbation ([place]) 06/21/2015  . Cough 06/09/2015  . Fever, unspecified 06/09/2015  . Chronic diastolic congestive heart failure ([place]) 04/24/2015  . Restless legs 04/24/2015  . Restless leg 04/24/2015  . Pain and swelling of left lower extremity 02/15/2015  . Candidiasis of perineum 02/15/2015  . Hemorrhoids 02/15/2015  . CKD (chronic kidney disease) stage 3, GFR 30-59 ml/min 02/06/2015  . Acute respiratory failure with hypoxia ([place]) 01/29/2015  . Acute pulmonary edema ([place]) 01/29/2015  . SOB (shortness of breath)   . Palliative care encounter   . Sepsis ([place]) 01/26/2015  . Cellulitis of leg, left 01/26/2015  . Protein-calorie malnutrition, severe ([place]) 11/06/2014  . Pressure ulcer 11/06/2014  . Acute diastolic CHF (congestive heart failure) ([place]) 11/04/2014  . Weakness 11/04/2014  . Hypertensive heart disease with CHF (congestive heart failure) ([place]) 11/04/2014  . Macrocytic anemia 11/04/2014  . Cardiomyopathy, ischemic 11/04/2014  . CHF exacerbation ([place]) 11/03/2014  . Body mass index (BMI) of 20.0-20.9 in adult 06/23/2014  . Hard of hearing 06/23/2014  . Rectal bleeding  07/16/2013  . Hyperlipidemia, statin intolerance 04/30/2013  . Paroxysmal atrial fibrillation ([place]) 04/30/2013  . Pacemaker - dual chamber Medtronic 2009 04/30/2013  . AAA (abdominal aortic aneurysm) ([place]) 04/30/2013  . Bilateral carotid artery stenosis 04/30/2013  . PAD (peripheral artery disease) ([place]) 04/30/2013  . Constipation 08/31/2011  . Hypotension 08/28/2011  . S/P total hip arthroplasty 08/28/2011  . CAD (coronary artery disease) 08/28/2011  . Hypothyroid 08/28/2011  . Klebsiella cystitis 08/28/2011  . Thrombocytopenia ([place]) 08/28/2011  . SSS (sick sinus syndrome) ([facility]) 10/05/2007    CBC    Component Value Date/Time   WBC 6.1 11/12/2015   WBC 5.8 06/24/2015 0648   RBC 3.73 (L) 06/24/2015 0648   HGB 12.0 11/12/2015   HCT 38 11/12/2015   HCT 38.0 08/01/2014 1401   PLT 175 11/12/2015   PLT 146 (L) 08/01/2014 1401   MCV 97.9 06/24/2015 0648   MCV 102 (H) 08/01/2014 1401   LYMPHSABS 1.2 06/23/2015 0352   LYMPHSABS 1.4 08/01/2014 1401   MONOABS 0.6 06/23/2015 0352   EOSABS 0.1 06/23/2015 0352   EOSABS 0.1 08/01/2014 1401   BASOSABS 0.0 06/23/2015 0352   BASOSABS 0.0 08/01/2014 1401    CMP     Component Value Date/Time   NA 137 11/12/2015   K 4.8 11/12/2015   CL 106 06/26/2015 0540   CO2 27 06/26/2015 0540   GLUCOSE 107 (H) 06/26/2015 0540   BUN 27 (A) 11/12/2015   CREATININE 1.4 (A) 11/12/2015   CREATININE 1.17 (H) 06/26/2015 0540   CREATININE 1.16 (H) 01/22/2015 1203   CALCIUM 8.5 (L) 06/26/2015 0540   PROT 4.9 (L) 06/24/2015 0648   PROT 6.0 08/01/2014 1401   ALBUMIN 2.1 (L) 06/26/2015 0540   ALBUMIN 3.9 08/01/2014 1401   AST 52 (A) 11/12/2015   ALT 36 (A) 11/12/2015   ALKPHOS 80 11/12/2015   BILITOT 0.7 06/24/2015 0648   BILITOT 0.3 08/01/2014 1401   GFRNONAA 38 (L) 06/26/2015 0540   GFRAA 44 (L) 06/26/2015 0540    Assessment and Plan  HERPES LABIALIS-  Now is time to go into repressive mode; will start valtrex 500 mg daily for 6 weeks- 4  months is recommended but will re-eval at 6 weeks; also have written for abreva ointment to use 5X daily until resolved-pt asked me for something she could put on  her lip; discussed this with daughter   Time spent > 25 min [name], MD

## 2015-12-03 DIAGNOSIS — R627 Adult failure to thrive: Secondary | ICD-10-CM | POA: Diagnosis not present

## 2015-12-03 LAB — CUP PACEART REMOTE DEVICE CHECK
Battery Impedance: 5614 Ohm
Battery Remaining Longevity: 5 mo
Brady Statistic AP VS Percent: 89 %
Brady Statistic AS VS Percent: 7 %
Implantable Lead Implant Date: 20090723
Implantable Lead Location: 753859
Lead Channel Impedance Value: 494 Ohm
Lead Channel Impedance Value: 558 Ohm
Lead Channel Pacing Threshold Amplitude: 1 V
Lead Channel Pacing Threshold Pulse Width: 0.4 ms
Lead Channel Sensing Intrinsic Amplitude: 8 mV
Lead Channel Setting Pacing Amplitude: 1.5 V
Lead Channel Setting Pacing Amplitude: 2 V
Lead Channel Setting Pacing Pulse Width: 0.4 ms
Lead Channel Setting Sensing Sensitivity: 4 mV
MDC IDC LEAD IMPLANT DT: 20090723
MDC IDC LEAD LOCATION: 753860
MDC IDC MSMT BATTERY VOLTAGE: 2.67 V
MDC IDC MSMT LEADCHNL RA PACING THRESHOLD AMPLITUDE: 0.75 V
MDC IDC MSMT LEADCHNL RA PACING THRESHOLD PULSEWIDTH: 0.4 ms
MDC IDC SESS DTM: 20170911200539
MDC IDC STAT BRADY AP VP PERCENT: 2 %
MDC IDC STAT BRADY AS VP PERCENT: 2 %

## 2015-12-11 ENCOUNTER — Non-Acute Institutional Stay (SKILLED_NURSING_FACILITY): Payer: Medicare Other | Admitting: Internal Medicine

## 2015-12-11 ENCOUNTER — Encounter: Payer: Self-pay | Admitting: Internal Medicine

## 2015-12-11 DIAGNOSIS — R21 Rash and other nonspecific skin eruption: Secondary | ICD-10-CM

## 2015-12-11 DIAGNOSIS — B001 Herpesviral vesicular dermatitis: Secondary | ICD-10-CM

## 2015-12-11 NOTE — Progress Notes (Signed)
Location:  [facility] Room Number: [place] of Service:  SNF (31)  [name], MD  Patient Care Team: No Pcp Per Patient as PCP - General (General Practice) Gayland Curry, DO as Consulting Physician (Geriatric Medicine)  Extended Emergency Contact Information Primary Emergency Contact: [name],[name] Address: [address]          [place], [place] [address] [name] of [place] Phone: [phone] Mobile Phone: [phone] Relation: Daughter Secondary Emergency Contact: [name] Address: [place], [place] [address] [name] of [place] Phone: [phone] Work Phone: [phone] Mobile Phone: [phone] Relation: Daughter    Allergies: Nubain [nalbuphine hcl]; Mirtazapine; Pineapple; Statins; and Iodine  Chief Complaint  Patient presents with  . Acute Visit    Acute    HPI: Patient is 82 y.o. female who is reported to have an itchy rash for several days. She thinks it is due to the new medication she is on. Pt has been seen in past days for recurrent herpes labialis and was started on valtrex suppressive therapy. Several days in onset of itchy rash on back and several other places.  Past Medical History:  Diagnosis Date  . Abdominal aortic aneurysm ([place])   . Anemia    hx  . Asthma    hx  . Atrial fibrillation ([place])   . Blood transfusion   . Blood transfusion without reported diagnosis   . CAD (coronary artery disease)   . COPD (chronic obstructive pulmonary disease) ([place])   . GERD (gastroesophageal reflux disease)   . GI bleed 2007  . H/O hiatal hernia   . Hypertension   . Hypothyroidism   . Myocardial infarction ([place])   . Osteoporosis   . Pacemaker   . Renal failure, chronic   . SSS (sick sinus syndrome) ([place]) 10/05/2007   Medtronic Adapta  . Stroke ([place])   . TIA (transient ischemic attack)   . UTI (lower urinary tract infection)    hx    Past Surgical History:    Procedure Laterality Date  . CARDIAC CATHETERIZATION    . COLONOSCOPY    . CORONARY STENT PLACEMENT     x9  . FRACTURE SURGERY     femur fx, /w ORIF into HIP- 2008  . NM MYOCAR PERF WALL MOTION  10/08/2010   mild apical anterior ischemia  . PACEMAKER INSERTION  10/05/2007   Medtronic Adapta  . TOTAL HIP ARTHROPLASTY     planned for 08/27/2011 Left  . TOTAL HIP ARTHROPLASTY  08/27/2011   Procedure: TOTAL HIP ARTHROPLASTY;  Surgeon: Kerin Salen, MD;  Location: [facility];  Service: Orthopedics;  Laterality: Right;  . US ECHOCARDIOGRAPHY  08/23/2011   EF 50%,LA mod. dilated,mild to mod. mitral annular ca+,mod. MR,mild to mod TR,mild to mod. PH,trace AI.      Medication List       Accurate as of 12/11/15  2:21 PM. Always use your most recent med list.          acetaminophen 325 MG tablet Commonly known as:  TYLENOL Take 325 mg by mouth at bedtime.   acetaminophen 325 MG tablet Commonly known as:  TYLENOL Take 650 mg by mouth every 6 (six) hours as needed for mild pain.   amiodarone 200 MG tablet Commonly known as:  PACERONE TAKE 1 TABLET BY MOUTH EVERY DAY   Biotin 1000 MCG tablet Take 1,000 mcg by mouth  daily.   CALCIUM 600+D 600-400 MG-UNIT tablet Generic drug:  Calcium Carbonate-Vitamin D Take 1 tablet by mouth daily.   clopidogrel 75 MG tablet Commonly known as:  PLAVIX TAKE 1 TABLET (75 MG TOTAL) BY MOUTH DAILY.   feeding supplement Liqd Take 237 mLs by mouth daily.   ferrous sulfate 325 (65 FE) MG tablet Take 325 mg by mouth daily.   furosemide 20 MG tablet Commonly known as:  LASIX Take 1 tablet (20 mg total) by mouth daily.   levothyroxine 125 MCG tablet Commonly known as:  SYNTHROID, LEVOTHROID Take 125 mcg by mouth daily before breakfast.   nitroGLYCERIN 0.4 MG SL tablet Commonly known as:  NITROSTAT Place 1 tablet (0.4 mg total) under the tongue every 5 (five) minutes as needed for chest pain (For a total of 3 tablets, if chest pain persist to call  911).   OXYGEN Inhale 2 L into the lungs as needed (to keep O2 sat above 90%).   pantoprazole 40 MG tablet Commonly known as:  PROTONIX TAKE 1 TABLET (40 MG TOTAL) BY MOUTH DAILY.   potassium chloride 10 MEQ tablet Commonly known as:  K-DUR Take 10 mEq by mouth 2 (two) times daily with a meal. 9am, 6pm   promethazine 25 MG tablet Commonly known as:  PHENERGAN Take 25 mg by mouth every 6 (six) hours as needed for nausea or vomiting.   rOPINIRole 0.25 MG tablet Commonly known as:  REQUIP Take 0.25 mg by mouth at bedtime.   vitamin B-12 1000 MCG tablet Commonly known as:  CYANOCOBALAMIN Take 1,000 mcg by mouth daily.       No orders of the defined types were placed in this encounter.   Immunization History  Administered Date(s) Administered  . Influenza,inj,Quad PF,36+ Mos 12/18/2013, 12/09/2014  . PPD Test 02/02/2015, 02/27/2015  . Pneumococcal Conjugate-13 12/09/2014    Social History  Substance Use Topics  . Smoking status: Former Smoker    Types: Cigarettes  . Smokeless tobacco: Never Used     Comment: quit 40 yrs ago- was smoking 2 packs a day at the most  . Alcohol use No    Review of Systems  DATA OBTAINED: from patient, daughter GENERAL:  no fevers, fatigue, appetite changes SKIN:+ itching, rash HEENT:blister R lower lip RESPIRATORY: No cough, wheezing, SOB CARDIAC: No chest pain, palpitations, lower extremity edema  GI: No abdominal pain, No N/V/D or constipation, No heartburn or reflux  GU: No dysuria, frequency or urgency, or incontinence  MUSCULOSKELETAL: No unrelieved bone/joint pain NEUROLOGIC: No headache, dizziness  PSYCHIATRIC: No overt anxiety or sadness  Vitals:   12/11/15 1419  BP: 112/62  Pulse: 82  Resp: 18  Temp: 97.6 F (36.4 C)   Body mass index is 20.48 kg/m. Physical Exam  GENERAL APPEARANCE: Alert, conversant, No acute distress  SKIN: rash is pink, raised slt scaley on upper back, L leg HEENT:  Unremarkable RESPIRATORY: Breathing is even, unlabored. Lung sounds are clear   CARDIOVASCULAR: Heart RRR no murmurs, rubs or gallops. No peripheral edema  GASTROINTESTINAL: Abdomen is soft, non-tender, not distended w/ normal bowel sounds.  GENITOURINARY: Bladder non tender, not distended  MUSCULOSKELETAL: No abnormal joints or musculature NEUROLOGIC: Cranial nerves 2-12 grossly intact. Moves all extremities PSYCHIATRIC: Mood and affect appropriate to situation, no behavioral issues  Patient Active Problem List   Diagnosis Date Noted  . COPD (chronic obstructive pulmonary disease) ([place]) 11/29/2015  . Abdominal aortic aneurysm (AAA) without rupture ([place]) 08/21/2015  . DNR (do  not resuscitate)   . Weakness generalized   . GERD (gastroesophageal reflux disease) 07/04/2015  . Dysphagia 07/04/2015  . Transaminitis 06/24/2015  . HCAP (healthcare-associated pneumonia) 06/22/2015  . Pneumonia 06/22/2015  . COPD exacerbation ([place]) 06/21/2015  . Cough 06/09/2015  . Fever, unspecified 06/09/2015  . Chronic diastolic congestive heart failure ([place]) 04/24/2015  . Restless legs 04/24/2015  . Restless leg 04/24/2015  . Pain and swelling of left lower extremity 02/15/2015  . Candidiasis of perineum 02/15/2015  . Hemorrhoids 02/15/2015  . CKD (chronic kidney disease) stage 3, GFR 30-59 ml/min 02/06/2015  . Acute respiratory failure with hypoxia ([place]) 01/29/2015  . Acute pulmonary edema ([place]) 01/29/2015  . SOB (shortness of breath)   . Palliative care encounter   . Sepsis ([place]) 01/26/2015  . Cellulitis of leg, left 01/26/2015  . Protein-calorie malnutrition, severe ([place]) 11/06/2014  . Pressure ulcer 11/06/2014  . Acute diastolic CHF (congestive heart failure) ([place]) 11/04/2014  . Weakness 11/04/2014  . Hypertensive heart disease with CHF (congestive heart failure) ([place]) 11/04/2014  . Macrocytic anemia 11/04/2014  . Cardiomyopathy, ischemic 11/04/2014  . CHF exacerbation ([place]) 11/03/2014  .  Body mass index (BMI) of 20.0-20.9 in adult 06/23/2014  . Hard of hearing 06/23/2014  . Rectal bleeding 07/16/2013  . Hyperlipidemia, statin intolerance 04/30/2013  . Paroxysmal atrial fibrillation ([place]) 04/30/2013  . Pacemaker - dual chamber Medtronic 2009 04/30/2013  . AAA (abdominal aortic aneurysm) ([place]) 04/30/2013  . Bilateral carotid artery stenosis 04/30/2013  . PAD (peripheral artery disease) ([place]) 04/30/2013  . Constipation 08/31/2011  . Hypotension 08/28/2011  . S/P total hip arthroplasty 08/28/2011  . CAD (coronary artery disease) 08/28/2011  . Hypothyroid 08/28/2011  . Klebsiella cystitis 08/28/2011  . Thrombocytopenia ([place]) 08/28/2011  . SSS (sick sinus syndrome) ([facility]) 10/05/2007    CMP     Component Value Date/Time   NA 137 11/12/2015   K 4.8 11/12/2015   CL 106 06/26/2015 0540   CO2 27 06/26/2015 0540   GLUCOSE 107 (H) 06/26/2015 0540   BUN 27 (A) 11/12/2015   CREATININE 1.4 (A) 11/12/2015   CREATININE 1.17 (H) 06/26/2015 0540   CREATININE 1.16 (H) 01/22/2015 1203   CALCIUM 8.5 (L) 06/26/2015 0540   PROT 4.9 (L) 06/24/2015 0648   PROT 6.0 08/01/2014 1401   ALBUMIN 2.1 (L) 06/26/2015 0540   ALBUMIN 3.9 08/01/2014 1401   AST 52 (A) 11/12/2015   ALT 36 (A) 11/12/2015   ALKPHOS 80 11/12/2015   BILITOT 0.7 06/24/2015 0648   BILITOT 0.3 08/01/2014 1401   GFRNONAA 38 (L) 06/26/2015 0540   GFRAA 44 (L) 06/26/2015 0540    Recent Labs  01/30/15 0530  06/23/15 0352 06/24/15 0648 06/26/15 0540 10/22/15 11/12/15  NA  --   < > 144 143 141 137 137  K  --   < > 4.6 4.4 4.4 4.3 4.8  CL  --   < > 105 107 106  --   --   CO2  --   < > 32 29 27  --   --   GLUCOSE  --   < > 88 80 107*  --   --   BUN  --   < > 27* 18 12 28* 27*  CREATININE  --   < > 1.50* 1.14* 1.17* 1.5* 1.4*  CALCIUM  --   < > 8.2* 8.5* 8.5*  --   --   MG 1.4*  --   --   --   --   --   --  PHOS  --   --   --   --  2.1*  --   --   < > = values in this interval not displayed.  Recent Labs   06/22/15 1609 06/23/15 0352 06/24/15 0648 06/26/15 0540 11/12/15  AST 123* 98* 88*  --  52*  ALT 71* 60* 57*  --  36*  ALKPHOS 89 66 73  --  80  BILITOT 0.8 0.9 0.7  --   --   PROT 5.8* 4.8* 4.9*  --   --   ALBUMIN 2.6* 2.1* 2.2* 2.1*  --     Recent Labs  01/26/15 1030  06/22/15 1609 06/23/15 0352 06/24/15 0648 10/22/15 11/12/15  WBC 30.4*  < > 8.7 6.2 5.8 6.7 6.1  NEUTROABS 28.6*  --  6.6 4.3  --   --   --   HGB 11.6*  < > 12.3 10.7* 11.1* 12.4 12.0  HCT 36.9  < > 38.9 34.5* 36.5 39 38  MCV 98.9  < > 96.8 96.6 97.9  --   --   PLT 157  < > 197 178 185 172 175  < > = values in this interval not displayed.  Recent Labs  11/12/15  CHOL 133  LDLCALC 68  TRIG 137   No results found for: [facility] Lab Results  Component Value Date   TSH 0.52 11/12/2015   Lab Results  Component Value Date   HGBA1C 5.7 11/12/2015   Lab Results  Component Value Date   CHOL 133 11/12/2015   HDL 38 11/12/2015   LDLCALC 68 11/12/2015   TRIG 137 11/12/2015   CHOLHDL 3.3 12/01/2012    Significant Diagnostic Results in last 30 days:  No results found.  Assessment and Plan  RASH FROM VALTREX - will d/c valtrex, continue abreva. Have started atarax 12.5 mg TID and 25 mg qHS; magic mouthwash to swish and spit TID; will monitor    Time spent > 25 min Shivansh Hardaway D. Sheppard Coil, MD

## 2015-12-24 ENCOUNTER — Ambulatory Visit (INDEPENDENT_AMBULATORY_CARE_PROVIDER_SITE_OTHER): Payer: Medicare Other | Admitting: *Deleted

## 2015-12-24 ENCOUNTER — Non-Acute Institutional Stay (SKILLED_NURSING_FACILITY): Payer: Medicare Other | Admitting: Internal Medicine

## 2015-12-24 ENCOUNTER — Encounter: Payer: Self-pay | Admitting: Internal Medicine

## 2015-12-24 ENCOUNTER — Telehealth: Payer: Self-pay | Admitting: Cardiology

## 2015-12-24 DIAGNOSIS — R1310 Dysphagia, unspecified: Secondary | ICD-10-CM | POA: Diagnosis not present

## 2015-12-24 DIAGNOSIS — I110 Hypertensive heart disease with heart failure: Secondary | ICD-10-CM

## 2015-12-24 DIAGNOSIS — Z950 Presence of cardiac pacemaker: Secondary | ICD-10-CM

## 2015-12-24 DIAGNOSIS — I5032 Chronic diastolic (congestive) heart failure: Secondary | ICD-10-CM

## 2015-12-24 NOTE — Progress Notes (Signed)
Location:  [facility] Room Number: 207W Place of Service:  SNF [phone])  Brandy Delaine. Sheppard Coil, MD  Patient Care Team: No Pcp Per Patient as PCP - General (General Practice) Gayland Curry, DO as Consulting Physician (Geriatric Medicine)  Extended Emergency Contact Information Primary Emergency Contact: [name] Address: [address]          [place], [place] [address] [name] of [place] Phone: [phone] Mobile Phone: [phone] Relation: Daughter Secondary Emergency Contact: [name] Address: [place], [place] [address] [name] of [place] Phone: [phone] Work Phone: [phone] Mobile Phone: [phone] Relation: Daughter    Allergies: Nubain [nalbuphine hcl]; Mirtazapine; Pineapple; Statins; and Iodine  Chief Complaint  Patient presents with  . Medical Management of Chronic Issues    Routine Visit    HPI: Patient is 82 y.o. female who is being seen for routine issues of HTN, CHF and dysphagia.  Past Medical History:  Diagnosis Date  . Abdominal aortic aneurysm ([place])   . Anemia    hx  . Asthma    hx  . Atrial fibrillation ([place])   . Blood transfusion   . Blood transfusion without reported diagnosis   . CAD (coronary artery disease)   . COPD (chronic obstructive pulmonary disease) ([place])   . GERD (gastroesophageal reflux disease)   . GI bleed 2007  . H/O hiatal hernia   . Hypertension   . Hypothyroidism   . Myocardial infarction   . Osteoporosis   . Pacemaker   . Renal failure, chronic   . SSS (sick sinus syndrome) ([place]) 10/05/2007   Medtronic Adapta  . Stroke ([place])   . TIA (transient ischemic attack)   . UTI (lower urinary tract infection)    hx    Past Surgical History:  Procedure Laterality Date  . CARDIAC CATHETERIZATION    . COLONOSCOPY    . CORONARY STENT PLACEMENT     x9  . FRACTURE SURGERY     femur fx, /w ORIF into HIP- 2008  . NM MYOCAR PERF WALL  MOTION  10/08/2010   mild apical anterior ischemia  . PACEMAKER INSERTION  10/05/2007   Medtronic Adapta  . TOTAL HIP ARTHROPLASTY     planned for 08/27/2011 Left  . TOTAL HIP ARTHROPLASTY  08/27/2011   Procedure: TOTAL HIP ARTHROPLASTY;  Surgeon: Kerin Salen, MD;  Location: [facility];  Service: Orthopedics;  Laterality: Right;  . US ECHOCARDIOGRAPHY  08/23/2011   EF 50%,LA mod. dilated,mild to mod. mitral annular ca+,mod. MR,mild to mod TR,mild to mod. PH,trace AI.      Medication List       Accurate as of 12/24/15 11:59 PM. Always use your most recent med list.          acetaminophen 325 MG tablet Commonly known as:  TYLENOL Take 325 mg by mouth at bedtime.   acetaminophen 325 MG tablet Commonly known as:  TYLENOL Take 650 mg by mouth every 6 (six) hours as needed for mild pain.   amiodarone 200 MG tablet Commonly known as:  PACERONE TAKE 1 TABLET BY MOUTH EVERY DAY   Biotin 1000 MCG tablet Take 1,000 mcg by mouth daily.   CALCIUM 600+D 600-400 MG-UNIT tablet Generic drug:  Calcium Carbonate-Vitamin D Take 1 tablet by mouth daily.   clopidogrel 75 MG tablet Commonly known as:  PLAVIX TAKE 1 TABLET (75 MG TOTAL) BY MOUTH DAILY.  feeding supplement Liqd Take 237 mLs by mouth daily.   ferrous sulfate 325 (65 FE) MG tablet Take 325 mg by mouth daily.   furosemide 20 MG tablet Commonly known as:  LASIX Take 1 tablet (20 mg total) by mouth daily.   levothyroxine 125 MCG tablet Commonly known as:  SYNTHROID, LEVOTHROID Take 125 mcg by mouth daily before breakfast.   magic mouthwash Soln Take 10 mLs by mouth 3 (three) times daily. Stop date 01/03/16.   nitroGLYCERIN 0.4 MG SL tablet Commonly known as:  NITROSTAT Place 1 tablet (0.4 mg total) under the tongue every 5 (five) minutes as needed for chest pain (For a total of 3 tablets, if chest pain persist to call 911).   OXYGEN Inhale 2 L into the lungs as needed (to keep O2 sat above 90%).   pantoprazole 40 MG  tablet Commonly known as:  PROTONIX TAKE 1 TABLET (40 MG TOTAL) BY MOUTH DAILY.   potassium chloride 10 MEQ tablet Commonly known as:  K-DUR Take 10 mEq by mouth 2 (two) times daily with a meal. 9am, 6pm   promethazine 25 MG tablet Commonly known as:  PHENERGAN Take 25 mg by mouth every 6 (six) hours as needed for nausea or vomiting.   rOPINIRole 0.25 MG tablet Commonly known as:  REQUIP Take 0.25 mg by mouth at bedtime.   vitamin B-12 1000 MCG tablet Commonly known as:  CYANOCOBALAMIN Take 1,000 mcg by mouth daily.       Meds ordered this encounter  Medications  . magic mouthwash SOLN    Sig: Take 10 mLs by mouth 3 (three) times daily. Stop date 01/03/16.    Immunization History  Administered Date(s) Administered  . Influenza,inj,Quad PF,36+ Mos 12/18/2013, 12/09/2014  . Influenza-Unspecified 12/16/2015  . PPD Test 02/02/2015, 02/27/2015  . Pneumococcal Conjugate-13 12/09/2014    Social History  Substance Use Topics  . Smoking status: Former Smoker    Types: Cigarettes  . Smokeless tobacco: Never Used     Comment: quit 40 yrs ago- was smoking 2 packs a day at the most  . Alcohol use No    Review of Systems  DATA OBTAINED: from patient - is good historian, nurse GENERAL:  no fevers, fatigue, appetite changes SKIN: No itching, rash HEENT: No complaint RESPIRATORY: No cough, wheezing, SOB CARDIAC: No chest pain, palpitations, lower extremity edema  GI: No abdominal pain, No N/V/D or constipation, No heartburn or reflux  GU: No dysuria, frequency or urgency, or incontinence  MUSCULOSKELETAL: No unrelieved bone/joint pain NEUROLOGIC: No headache, dizziness  PSYCHIATRIC: No overt anxiety or sadness  Vitals:   12/24/15 1144  BP: 110/65  Pulse: 74  Resp: 16  Temp: 97.1 F (36.2 C)   Body mass index is 20.41 kg/m. Physical Exam  GENERAL APPEARANCE: Alert, conversant, No acute distress  SKIN: No diaphoresis rash HEENT: Unremarkable RESPIRATORY:  Breathing is even, unlabored. Lung sounds are clear   CARDIOVASCULAR: Heart RRR no murmurs, rubs or gallops. No peripheral edema  GASTROINTESTINAL: Abdomen is soft, non-tender, not distended w/ normal bowel sounds.  GENITOURINARY: Bladder non tender, not distended  MUSCULOSKELETAL: No abnormal joints or musculature NEUROLOGIC: Cranial nerves 2-12 grossly intact. Moves all extremities PSYCHIATRIC: Mood and affect appropriate to situation, no behavioral issues  Patient Active Problem List   Diagnosis Date Noted  . COPD (chronic obstructive pulmonary disease) ([place]) 11/29/2015  . Abdominal aortic aneurysm (AAA) without rupture ([place]) 08/21/2015  . DNR (do not resuscitate)   . Weakness generalized   .  GERD (gastroesophageal reflux disease) 07/04/2015  . Dysphagia 07/04/2015  . Transaminitis 06/24/2015  . HCAP (healthcare-associated pneumonia) 06/22/2015  . Pneumonia 06/22/2015  . COPD exacerbation ([place]) 06/21/2015  . Cough 06/09/2015  . Fever, unspecified 06/09/2015  . Chronic diastolic congestive heart failure ([place]) 04/24/2015  . Restless legs 04/24/2015  . Restless leg 04/24/2015  . Pain and swelling of left lower extremity 02/15/2015  . Candidiasis of perineum 02/15/2015  . Hemorrhoids 02/15/2015  . CKD (chronic kidney disease) stage 3, GFR 30-59 ml/min 02/06/2015  . Acute respiratory failure with hypoxia ([place]) 01/29/2015  . Acute pulmonary edema ([place]) 01/29/2015  . SOB (shortness of breath)   . Palliative care encounter   . Sepsis ([place]) 01/26/2015  . Cellulitis of leg, left 01/26/2015  . Protein-calorie malnutrition, severe ([place]) 11/06/2014  . Pressure ulcer 11/06/2014  . Acute diastolic CHF (congestive heart failure) ([place]) 11/04/2014  . Weakness 11/04/2014  . Hypertensive heart disease with CHF (congestive heart failure) ([place]) 11/04/2014  . Macrocytic anemia 11/04/2014  . Cardiomyopathy, ischemic 11/04/2014  . CHF exacerbation ([place]) 11/03/2014  . Body mass index (BMI) of  20.0-20.9 in adult 06/23/2014  . Hard of hearing 06/23/2014  . Rectal bleeding 07/16/2013  . Hyperlipidemia, statin intolerance 04/30/2013  . Paroxysmal atrial fibrillation ([place]) 04/30/2013  . Pacemaker - dual chamber Medtronic 2009 04/30/2013  . AAA (abdominal aortic aneurysm) ([place]) 04/30/2013  . Bilateral carotid artery stenosis 04/30/2013  . PAD (peripheral artery disease) ([place]) 04/30/2013  . Constipation 08/31/2011  . Hypotension 08/28/2011  . S/P total hip arthroplasty 08/28/2011  . CAD (coronary artery disease) 08/28/2011  . Hypothyroid 08/28/2011  . Klebsiella cystitis 08/28/2011  . Thrombocytopenia ([place]) 08/28/2011  . SSS (sick sinus syndrome) ([facility]) 10/05/2007    CMP     Component Value Date/Time   NA 137 11/12/2015   K 4.8 11/12/2015   CL 106 06/26/2015 0540   CO2 27 06/26/2015 0540   GLUCOSE 107 (H) 06/26/2015 0540   BUN 27 (A) 11/12/2015   CREATININE 1.4 (A) 11/12/2015   CREATININE 1.17 (H) 06/26/2015 0540   CREATININE 1.16 (H) 01/22/2015 1203   CALCIUM 8.5 (L) 06/26/2015 0540   PROT 4.9 (L) 06/24/2015 0648   PROT 6.0 08/01/2014 1401   ALBUMIN 2.1 (L) 06/26/2015 0540   ALBUMIN 3.9 08/01/2014 1401   AST 52 (A) 11/12/2015   ALT 36 (A) 11/12/2015   ALKPHOS 80 11/12/2015   BILITOT 0.7 06/24/2015 0648   BILITOT 0.3 08/01/2014 1401   GFRNONAA 38 (L) 06/26/2015 0540   GFRAA 44 (L) 06/26/2015 0540    Recent Labs  01/30/15 0530  06/23/15 0352 06/24/15 0648 06/26/15 0540 10/22/15 11/12/15  NA  --   < > 144 143 141 137 137  K  --   < > 4.6 4.4 4.4 4.3 4.8  CL  --   < > 105 107 106  --   --   CO2  --   < > 32 29 27  --   --   GLUCOSE  --   < > 88 80 107*  --   --   BUN  --   < > 27* 18 12 28* 27*  CREATININE  --   < > 1.50* 1.14* 1.17* 1.5* 1.4*  CALCIUM  --   < > 8.2* 8.5* 8.5*  --   --   MG 1.4*  --   --   --   --   --   --   PHOS  --   --   --   --  2.1*  --   --   < > = values in this interval not displayed.  Recent Labs  06/22/15 1609  06/23/15 0352 06/24/15 0648 06/26/15 0540 11/12/15  AST 123* 98* 88*  --  52*  ALT 71* 60* 57*  --  36*  ALKPHOS 89 66 73  --  80  BILITOT 0.8 0.9 0.7  --   --   PROT 5.8* 4.8* 4.9*  --   --   ALBUMIN 2.6* 2.1* 2.2* 2.1*  --     Recent Labs  01/26/15 1030  06/22/15 1609 06/23/15 0352 06/24/15 0648 10/22/15 11/12/15  WBC 30.4*  < > 8.7 6.2 5.8 6.7 6.1  NEUTROABS 28.6*  --  6.6 4.3  --   --   --   HGB 11.6*  < > 12.3 10.7* 11.1* 12.4 12.0  HCT 36.9  < > 38.9 34.5* 36.5 39 38  MCV 98.9  < > 96.8 96.6 97.9  --   --   PLT 157  < > 197 178 185 172 175  < > = values in this interval not displayed.  Recent Labs  11/12/15  CHOL 133  LDLCALC 68  TRIG 137   No results found for: [facility] Lab Results  Component Value Date   TSH 0.52 11/12/2015   Lab Results  Component Value Date   HGBA1C 5.7 11/12/2015   Lab Results  Component Value Date   CHOL 133 11/12/2015   HDL 38 11/12/2015   LDLCALC 68 11/12/2015   TRIG 137 11/12/2015   CHOLHDL 3.3 12/01/2012    Significant Diagnostic Results in last 30 days:  No results found.  Assessment and Plan  Hypertensive heart disease with CHF (congestive heart failure) (HCC) Bp has been very well controlled;plan to cont lasix 20 mg daily  Chronic diastolic congestive heart failure ([place]) Has been without exacerbation in a good while ;stable on lasix 20 mg;will cont lasix 20 mg daily  Dysphagia Pt has cough in August but has been without PNA for a good while;plan to cont current diet      [name] D. Sheppard Coil, MD

## 2015-12-24 NOTE — Progress Notes (Signed)
Remote pacemaker transmission.   

## 2015-12-24 NOTE — Telephone Encounter (Signed)
LMOVM reminding pt to send remote transmission.   

## 2015-12-25 ENCOUNTER — Encounter: Payer: Self-pay | Admitting: Cardiology

## 2015-12-28 ENCOUNTER — Encounter: Payer: Self-pay | Admitting: Internal Medicine

## 2015-12-28 NOTE — Assessment & Plan Note (Signed)
Pt has cough in August but has been without PNA for a good while;plan to cont current diet

## 2015-12-28 NOTE — Assessment & Plan Note (Signed)
Has been without exacerbation in a good while ;stable on lasix 20 mg;will cont lasix 20 mg daily

## 2015-12-28 NOTE — Assessment & Plan Note (Signed)
Bp has been very well controlled;plan to cont lasix 20 mg daily

## 2016-01-09 DIAGNOSIS — M6281 Muscle weakness (generalized): Secondary | ICD-10-CM | POA: Diagnosis not present

## 2016-01-09 DIAGNOSIS — I480 Paroxysmal atrial fibrillation: Secondary | ICD-10-CM | POA: Diagnosis not present

## 2016-01-12 DIAGNOSIS — I480 Paroxysmal atrial fibrillation: Secondary | ICD-10-CM | POA: Diagnosis not present

## 2016-01-12 DIAGNOSIS — M6281 Muscle weakness (generalized): Secondary | ICD-10-CM | POA: Diagnosis not present

## 2016-01-13 DIAGNOSIS — M6281 Muscle weakness (generalized): Secondary | ICD-10-CM | POA: Diagnosis not present

## 2016-01-13 DIAGNOSIS — I480 Paroxysmal atrial fibrillation: Secondary | ICD-10-CM | POA: Diagnosis not present

## 2016-01-14 DIAGNOSIS — R2689 Other abnormalities of gait and mobility: Secondary | ICD-10-CM | POA: Diagnosis not present

## 2016-01-14 DIAGNOSIS — I480 Paroxysmal atrial fibrillation: Secondary | ICD-10-CM | POA: Diagnosis not present

## 2016-01-14 DIAGNOSIS — M6281 Muscle weakness (generalized): Secondary | ICD-10-CM | POA: Diagnosis not present

## 2016-01-15 DIAGNOSIS — R2689 Other abnormalities of gait and mobility: Secondary | ICD-10-CM | POA: Diagnosis not present

## 2016-01-15 DIAGNOSIS — M6281 Muscle weakness (generalized): Secondary | ICD-10-CM | POA: Diagnosis not present

## 2016-01-15 DIAGNOSIS — I480 Paroxysmal atrial fibrillation: Secondary | ICD-10-CM | POA: Diagnosis not present

## 2016-01-16 DIAGNOSIS — R2689 Other abnormalities of gait and mobility: Secondary | ICD-10-CM | POA: Diagnosis not present

## 2016-01-16 DIAGNOSIS — I480 Paroxysmal atrial fibrillation: Secondary | ICD-10-CM | POA: Diagnosis not present

## 2016-01-16 DIAGNOSIS — M6281 Muscle weakness (generalized): Secondary | ICD-10-CM | POA: Diagnosis not present

## 2016-01-19 DIAGNOSIS — R2689 Other abnormalities of gait and mobility: Secondary | ICD-10-CM | POA: Diagnosis not present

## 2016-01-19 DIAGNOSIS — M6281 Muscle weakness (generalized): Secondary | ICD-10-CM | POA: Diagnosis not present

## 2016-01-19 DIAGNOSIS — I480 Paroxysmal atrial fibrillation: Secondary | ICD-10-CM | POA: Diagnosis not present

## 2016-01-20 DIAGNOSIS — H04123 Dry eye syndrome of bilateral lacrimal glands: Secondary | ICD-10-CM | POA: Diagnosis not present

## 2016-01-20 DIAGNOSIS — I480 Paroxysmal atrial fibrillation: Secondary | ICD-10-CM | POA: Diagnosis not present

## 2016-01-20 DIAGNOSIS — M6281 Muscle weakness (generalized): Secondary | ICD-10-CM | POA: Diagnosis not present

## 2016-01-20 DIAGNOSIS — H353131 Nonexudative age-related macular degeneration, bilateral, early dry stage: Secondary | ICD-10-CM | POA: Diagnosis not present

## 2016-01-20 DIAGNOSIS — H02052 Trichiasis without entropian right lower eyelid: Secondary | ICD-10-CM | POA: Diagnosis not present

## 2016-01-20 DIAGNOSIS — R2689 Other abnormalities of gait and mobility: Secondary | ICD-10-CM | POA: Diagnosis not present

## 2016-01-21 DIAGNOSIS — R2689 Other abnormalities of gait and mobility: Secondary | ICD-10-CM | POA: Diagnosis not present

## 2016-01-21 DIAGNOSIS — M6281 Muscle weakness (generalized): Secondary | ICD-10-CM | POA: Diagnosis not present

## 2016-01-21 DIAGNOSIS — I480 Paroxysmal atrial fibrillation: Secondary | ICD-10-CM | POA: Diagnosis not present

## 2016-01-22 ENCOUNTER — Encounter: Payer: Self-pay | Admitting: Internal Medicine

## 2016-01-22 ENCOUNTER — Non-Acute Institutional Stay (SKILLED_NURSING_FACILITY): Payer: Medicare Other | Admitting: Internal Medicine

## 2016-01-22 DIAGNOSIS — M6281 Muscle weakness (generalized): Secondary | ICD-10-CM | POA: Diagnosis not present

## 2016-01-22 DIAGNOSIS — E034 Atrophy of thyroid (acquired): Secondary | ICD-10-CM | POA: Diagnosis not present

## 2016-01-22 DIAGNOSIS — R2689 Other abnormalities of gait and mobility: Secondary | ICD-10-CM | POA: Diagnosis not present

## 2016-01-22 DIAGNOSIS — K219 Gastro-esophageal reflux disease without esophagitis: Secondary | ICD-10-CM | POA: Diagnosis not present

## 2016-01-22 DIAGNOSIS — I714 Abdominal aortic aneurysm, without rupture, unspecified: Secondary | ICD-10-CM

## 2016-01-22 DIAGNOSIS — I480 Paroxysmal atrial fibrillation: Secondary | ICD-10-CM | POA: Diagnosis not present

## 2016-01-22 NOTE — Progress Notes (Signed)
Location:  [facility] Room Number: 207W Place of Service:  SNF [phone])  [name], MD  Patient Care Team: No Pcp Per Patient as PCP - General (General Practice) Gayland Curry, DO as Consulting Physician (Geriatric Medicine)  Extended Emergency Contact Information Primary Emergency Contact: [name],[name] Address: [address]          [place], [place] [address] [name] of [place] Phone: [phone] Mobile Phone: [phone] Relation: Daughter Secondary Emergency Contact: [name] Address: [place], [place] [address] [name] of [place] Phone: [phone] Work Phone: [phone] Mobile Phone: [phone] Relation: Daughter    Allergies: Nubain [nalbuphine hcl]; Mirtazapine; Pineapple; Statins; and Iodine  Chief Complaint  Patient presents with  . Medical Management of Chronic Issues    Routine Visit    HPI: Patient is 82 y.o. female who is being seen for routine issues of AAA without rupture, GERD, and hypothyroidism.  Past Medical History:  Diagnosis Date  . Abdominal aortic aneurysm ([place])   . Anemia    hx  . Asthma    hx  . Atrial fibrillation ([place])   . Blood transfusion   . Blood transfusion without reported diagnosis   . CAD (coronary artery disease)   . COPD (chronic obstructive pulmonary disease) ([place])   . GERD (gastroesophageal reflux disease)   . GI bleed 2007  . H/O hiatal hernia   . Hypertension   . Hypothyroidism   . Myocardial infarction   . Osteoporosis   . Pacemaker   . Renal failure, chronic   . SSS (sick sinus syndrome) ([place]) 10/05/2007   Medtronic Adapta  . Stroke ([place])   . TIA (transient ischemic attack)   . UTI (lower urinary tract infection)    hx    Past Surgical History:  Procedure Laterality Date  . CARDIAC CATHETERIZATION    . COLONOSCOPY    . CORONARY STENT PLACEMENT     x9  . FRACTURE SURGERY     femur fx, /w ORIF into HIP- 2008  .  NM MYOCAR PERF WALL MOTION  10/08/2010   mild apical anterior ischemia  . PACEMAKER INSERTION  10/05/2007   Medtronic Adapta  . TOTAL HIP ARTHROPLASTY     planned for 08/27/2011 Left  . TOTAL HIP ARTHROPLASTY  08/27/2011   Procedure: TOTAL HIP ARTHROPLASTY;  Surgeon: Kerin Salen, MD;  Location: [facility];  Service: Orthopedics;  Laterality: Right;  . US ECHOCARDIOGRAPHY  08/23/2011   EF 50%,LA mod. dilated,mild to mod. mitral annular ca+,mod. MR,mild to mod TR,mild to mod. PH,trace AI.      Medication List       Accurate as of 01/22/16 11:59 PM. Always use your most recent med list.          ABREVA 10 % Crea Generic drug:  Docosanol Apply topically. Apply to affected area on lip  5 x day until healed   acetaminophen 325 MG tablet Commonly known as:  TYLENOL Take 325 mg by mouth at bedtime.   acetaminophen 325 MG tablet Commonly known as:  TYLENOL Take 650 mg by mouth every 6 (six) hours as needed for mild pain.   amiodarone 200 MG tablet Commonly known as:  PACERONE TAKE 1 TABLET BY MOUTH EVERY DAY   Biotin 1000 MCG tablet Take 1,000 mcg by mouth daily.   CALCIUM 600+D 600-400 MG-UNIT tablet Generic drug:  Calcium Carbonate-Vitamin D Take 1  tablet by mouth daily.   clopidogrel 75 MG tablet Commonly known as:  PLAVIX TAKE 1 TABLET (75 MG TOTAL) BY MOUTH DAILY.   cycloSPORINE 0.05 % ophthalmic emulsion Commonly known as:  RESTASIS Place 1 drop into both eyes every 12 (twelve) hours.   feeding supplement Liqd Take 237 mLs by mouth daily.   ferrous sulfate 325 (65 FE) MG tablet Take 325 mg by mouth daily.   furosemide 20 MG tablet Commonly known as:  LASIX Take 1 tablet (20 mg total) by mouth daily.   levothyroxine 125 MCG tablet Commonly known as:  SYNTHROID, LEVOTHROID Take 125 mcg by mouth daily before breakfast.   nitroGLYCERIN 0.4 MG SL tablet Commonly known as:  NITROSTAT Place 1 tablet (0.4 mg total) under the tongue every 5 (five) minutes as needed for  chest pain (For a total of 3 tablets, if chest pain persist to call 911).   OXYGEN Inhale 2 L into the lungs as needed (to keep O2 sat above 90%).   pantoprazole 40 MG tablet Commonly known as:  PROTONIX TAKE 1 TABLET (40 MG TOTAL) BY MOUTH DAILY.   potassium chloride 10 MEQ tablet Commonly known as:  K-DUR Take 10 mEq by mouth 2 (two) times daily with a meal. 9am, 6pm   promethazine 25 MG tablet Commonly known as:  PHENERGAN Take 25 mg by mouth every 6 (six) hours as needed for nausea or vomiting.   REFRESH LIQUIGEL OP Place 1 drop into the right eye 3 (three) times daily.   rOPINIRole 0.25 MG tablet Commonly known as:  REQUIP Take 0.25 mg by mouth at bedtime.   vitamin B-12 1000 MCG tablet Commonly known as:  CYANOCOBALAMIN Take 1,000 mcg by mouth daily.       Meds ordered this encounter  Medications  . Carboxymethylcellulose Sodium (REFRESH LIQUIGEL OP)    Sig: Place 1 drop into the right eye 3 (three) times daily.  . cycloSPORINE (RESTASIS) 0.05 % ophthalmic emulsion    Sig: Place 1 drop into both eyes every 12 (twelve) hours.    Immunization History  Administered Date(s) Administered  . Influenza,inj,Quad PF,36+ Mos 12/18/2013, 12/09/2014  . Influenza-Unspecified 12/16/2015  . PPD Test 02/02/2015, 02/27/2015  . Pneumococcal Conjugate-13 12/09/2014    Social History  Substance Use Topics  . Smoking status: Former Smoker    Types: Cigarettes  . Smokeless tobacco: Never Used     Comment: quit 40 yrs ago- was smoking 2 packs a day at the most  . Alcohol use No    Review of Systems  DATA OBTAINED: from patient, nurse GENERAL:  no fevers, fatigue, appetite changes SKIN: No itching, rash HEENT: No complaint RESPIRATORY: No cough, wheezing, SOB CARDIAC: No chest pain, palpitations, lower extremity edema  GI: No abdominal pain, No N/V/D or constipation, No heartburn or reflux  GU: No dysuria, frequency or urgency, or incontinence  MUSCULOSKELETAL: No  unrelieved bone/joint pain NEUROLOGIC: No headache, dizziness  PSYCHIATRIC: No overt anxiety or sadness  Vitals:   01/22/16 0911  BP: 112/71  Pulse: 67  Resp: 16  Temp: 97.3 F (36.3 C)   Body mass index is 20.67 kg/m. Physical Exam  GENERAL APPEARANCE: Alert, conversant, No acute distress  SKIN: No diaphoresis rash HEENT: Unremarkable RESPIRATORY: Breathing is even, unlabored. Lung sounds are clear   CARDIOVASCULAR: Heart RRR no murmurs, rubs or gallops. No peripheral edema  GASTROINTESTINAL: Abdomen is soft, non-tender, not distended w/ normal bowel sounds.  GENITOURINARY: Bladder non tender, not distended  MUSCULOSKELETAL: No abnormal joints or musculature NEUROLOGIC: Cranial nerves 2-12 grossly intact. Moves all extremities PSYCHIATRIC: Mood and affect appropriate to situation, no behavioral issues  Patient Active Problem List   Diagnosis Date Noted  . COPD (chronic obstructive pulmonary disease) ([place]) 11/29/2015  . Abdominal aortic aneurysm (AAA) without rupture ([place]) 08/21/2015  . DNR (do not resuscitate)   . Weakness generalized   . GERD (gastroesophageal reflux disease) 07/04/2015  . Dysphagia 07/04/2015  . Transaminitis 06/24/2015  . HCAP (healthcare-associated pneumonia) 06/22/2015  . Pneumonia 06/22/2015  . COPD exacerbation ([place]) 06/21/2015  . Cough 06/09/2015  . Fever, unspecified 06/09/2015  . Chronic diastolic congestive heart failure ([place]) 04/24/2015  . Restless legs 04/24/2015  . Restless leg 04/24/2015  . Pain and swelling of left lower extremity 02/15/2015  . Candidiasis of perineum 02/15/2015  . Hemorrhoids 02/15/2015  . CKD (chronic kidney disease) stage 3, GFR 30-59 ml/min 02/06/2015  . Acute respiratory failure with hypoxia ([place]) 01/29/2015  . Acute pulmonary edema ([place]) 01/29/2015  . SOB (shortness of breath)   . Palliative care encounter   . Sepsis ([place]) 01/26/2015  . Cellulitis of leg, left 01/26/2015  . Protein-calorie malnutrition,  severe ([place]) 11/06/2014  . Pressure ulcer 11/06/2014  . Acute diastolic CHF (congestive heart failure) ([place]) 11/04/2014  . Weakness 11/04/2014  . Hypertensive heart disease with CHF (congestive heart failure) ([place]) 11/04/2014  . Macrocytic anemia 11/04/2014  . Cardiomyopathy, ischemic 11/04/2014  . CHF exacerbation ([place]) 11/03/2014  . Body mass index (BMI) of 20.0-20.9 in adult 06/23/2014  . Hard of hearing 06/23/2014  . Rectal bleeding 07/16/2013  . Hyperlipidemia, statin intolerance 04/30/2013  . Paroxysmal atrial fibrillation ([place]) 04/30/2013  . Pacemaker - dual chamber Medtronic 2009 04/30/2013  . AAA (abdominal aortic aneurysm) ([place]) 04/30/2013  . Bilateral carotid artery stenosis 04/30/2013  . PAD (peripheral artery disease) ([place]) 04/30/2013  . Constipation 08/31/2011  . Hypotension 08/28/2011  . S/P total hip arthroplasty 08/28/2011  . CAD (coronary artery disease) 08/28/2011  . Hypothyroid 08/28/2011  . Klebsiella cystitis 08/28/2011  . Thrombocytopenia ([place]) 08/28/2011  . SSS (sick sinus syndrome) (H[facility]) 10/05/2007    CMP     Component Value Date/Time   NA 137 11/12/2015   K 4.8 11/12/2015   CL 106 06/26/2015 0540   CO2 27 06/26/2015 0540   GLUCOSE 107 (H) 06/26/2015 0540   BUN 27 (A) 11/12/2015   CREATININE 1.4 (A) 11/12/2015   CREATININE 1.17 (H) 06/26/2015 0540   CREATININE 1.16 (H) 01/22/2015 1203   CALCIUM 8.5 (L) 06/26/2015 0540   PROT 4.9 (L) 06/24/2015 0648   PROT 6.0 08/01/2014 1401   ALBUMIN 2.1 (L) 06/26/2015 0540   ALBUMIN 3.9 08/01/2014 1401   AST 52 (A) 11/12/2015   ALT 36 (A) 11/12/2015   ALKPHOS 80 11/12/2015   BILITOT 0.7 06/24/2015 0648   BILITOT 0.3 08/01/2014 1401   GFRNONAA 38 (L) 06/26/2015 0540   GFRAA 44 (L) 06/26/2015 0540    Recent Labs  01/30/15 0530  06/23/15 0352 06/24/15 0648 06/26/15 0540 10/22/15 11/12/15  NA  --   < > 144 143 141 137 137  K  --   < > 4.6 4.4 4.4 4.3 4.8  CL  --   < > 105 107 106  --   --   CO2  --    < > 32 29 27  --   --   GLUCOSE  --   < > 88 80 107*  --   --  BUN  --   < > 27* 18 12 28* 27*  CREATININE  --   < > 1.50* 1.14* 1.17* 1.5* 1.4*  CALCIUM  --   < > 8.2* 8.5* 8.5*  --   --   MG 1.4*  --   --   --   --   --   --   PHOS  --   --   --   --  2.1*  --   --   < > = values in this interval not displayed.  Recent Labs  06/22/15 1609 06/23/15 0352 06/24/15 0648 06/26/15 0540 11/12/15  AST 123* 98* 88*  --  52*  ALT 71* 60* 57*  --  36*  ALKPHOS 89 66 73  --  80  BILITOT 0.8 0.9 0.7  --   --   PROT 5.8* 4.8* 4.9*  --   --   ALBUMIN 2.6* 2.1* 2.2* 2.1*  --     Recent Labs  01/26/15 1030  06/22/15 1609 06/23/15 0352 06/24/15 0648 10/22/15 11/12/15  WBC 30.4*  < > 8.7 6.2 5.8 6.7 6.1  NEUTROABS 28.6*  --  6.6 4.3  --   --   --   HGB 11.6*  < > 12.3 10.7* 11.1* 12.4 12.0  HCT 36.9  < > 38.9 34.5* 36.5 39 38  MCV 98.9  < > 96.8 96.6 97.9  --   --   PLT 157  < > 197 178 185 172 175  < > = values in this interval not displayed.  Recent Labs  11/12/15  CHOL 133  LDLCALC 68  TRIG 137   No results found for: [facility] Lab Results  Component Value Date   TSH 0.52 11/12/2015   Lab Results  Component Value Date   HGBA1C 5.7 11/12/2015   Lab Results  Component Value Date   CHOL 133 11/12/2015   HDL 38 11/12/2015   LDLCALC 68 11/12/2015   TRIG 137 11/12/2015   CHOLHDL 3.3 12/01/2012    Significant Diagnostic Results in last 30 days:  No results found.  Assessment and Plan  Abdominal aortic aneurysm (AAA) without rupture (HCC) Size is 3.8 cm, stable;pt not surgical candidate;plan to cont BP control, pt is not diabetic, intolerant to statins, cards has signed off monitoring  GERD (gastroesophageal reflux disease) No reflux or aspiration reported;plan to cont protonix 40 mg daily  Hypothyroid Last TSH 0.52; pt is stable, will cont synthroid 125 mcg daily     Shaquetta Arcos D. Sheppard Coil, MD

## 2016-01-23 DIAGNOSIS — M6281 Muscle weakness (generalized): Secondary | ICD-10-CM | POA: Diagnosis not present

## 2016-01-23 DIAGNOSIS — I480 Paroxysmal atrial fibrillation: Secondary | ICD-10-CM | POA: Diagnosis not present

## 2016-01-23 DIAGNOSIS — R2689 Other abnormalities of gait and mobility: Secondary | ICD-10-CM | POA: Diagnosis not present

## 2016-01-23 LAB — CUP PACEART REMOTE DEVICE CHECK
Battery Impedance: 5573 Ohm
Battery Remaining Longevity: 4 mo
Battery Voltage: 2.66 V
Brady Statistic AS VP Percent: 2 %
Implantable Lead Implant Date: 20090723
Implantable Lead Location: 753859
Implantable Lead Location: 753860
Implantable Lead Model: 5076
Implantable Lead Model: 5076
Implantable Pulse Generator Implant Date: 20090723
Lead Channel Pacing Threshold Amplitude: 0.75 V
Lead Channel Pacing Threshold Pulse Width: 0.4 ms
Lead Channel Pacing Threshold Pulse Width: 0.4 ms
Lead Channel Setting Pacing Amplitude: 1.5 V
Lead Channel Setting Pacing Amplitude: 2.25 V
Lead Channel Setting Pacing Pulse Width: 0.4 ms
MDC IDC LEAD IMPLANT DT: 20090723
MDC IDC MSMT LEADCHNL RA IMPEDANCE VALUE: 508 Ohm
MDC IDC MSMT LEADCHNL RV IMPEDANCE VALUE: 489 Ohm
MDC IDC MSMT LEADCHNL RV PACING THRESHOLD AMPLITUDE: 1.125 V
MDC IDC SESS DTM: 20171011190625
MDC IDC SET LEADCHNL RV SENSING SENSITIVITY: 4 mV
MDC IDC STAT BRADY AP VP PERCENT: 2 %
MDC IDC STAT BRADY AP VS PERCENT: 87 %
MDC IDC STAT BRADY AS VS PERCENT: 9 %

## 2016-01-25 ENCOUNTER — Encounter: Payer: Self-pay | Admitting: Internal Medicine

## 2016-01-25 NOTE — Assessment & Plan Note (Signed)
No reflux or aspiration reported;plan to cont protonix 40 mg daily

## 2016-01-25 NOTE — Assessment & Plan Note (Signed)
Size is 3.8 cm, stable;pt not surgical candidate;plan to cont BP control, pt is not diabetic, intolerant to statins, cards has signed off monitoring

## 2016-01-25 NOTE — Assessment & Plan Note (Signed)
Last TSH 0.52; pt is stable, will cont synthroid 125 mcg daily

## 2016-01-26 ENCOUNTER — Ambulatory Visit (INDEPENDENT_AMBULATORY_CARE_PROVIDER_SITE_OTHER): Payer: Medicare Other | Admitting: *Deleted

## 2016-01-26 ENCOUNTER — Telehealth: Payer: Self-pay | Admitting: Cardiology

## 2016-01-26 DIAGNOSIS — Z950 Presence of cardiac pacemaker: Secondary | ICD-10-CM

## 2016-01-26 DIAGNOSIS — R2689 Other abnormalities of gait and mobility: Secondary | ICD-10-CM | POA: Diagnosis not present

## 2016-01-26 DIAGNOSIS — M6281 Muscle weakness (generalized): Secondary | ICD-10-CM | POA: Diagnosis not present

## 2016-01-26 DIAGNOSIS — I480 Paroxysmal atrial fibrillation: Secondary | ICD-10-CM | POA: Diagnosis not present

## 2016-01-26 NOTE — Telephone Encounter (Signed)
LMOVM reminding pt to send remote transmission.   

## 2016-01-28 DIAGNOSIS — R2689 Other abnormalities of gait and mobility: Secondary | ICD-10-CM | POA: Diagnosis not present

## 2016-01-28 DIAGNOSIS — I480 Paroxysmal atrial fibrillation: Secondary | ICD-10-CM | POA: Diagnosis not present

## 2016-01-28 DIAGNOSIS — M6281 Muscle weakness (generalized): Secondary | ICD-10-CM | POA: Diagnosis not present

## 2016-01-29 DIAGNOSIS — M6281 Muscle weakness (generalized): Secondary | ICD-10-CM | POA: Diagnosis not present

## 2016-01-29 DIAGNOSIS — R2689 Other abnormalities of gait and mobility: Secondary | ICD-10-CM | POA: Diagnosis not present

## 2016-01-29 DIAGNOSIS — I480 Paroxysmal atrial fibrillation: Secondary | ICD-10-CM | POA: Diagnosis not present

## 2016-01-30 DIAGNOSIS — I480 Paroxysmal atrial fibrillation: Secondary | ICD-10-CM | POA: Diagnosis not present

## 2016-01-30 DIAGNOSIS — M6281 Muscle weakness (generalized): Secondary | ICD-10-CM | POA: Diagnosis not present

## 2016-01-30 DIAGNOSIS — R2689 Other abnormalities of gait and mobility: Secondary | ICD-10-CM | POA: Diagnosis not present

## 2016-01-30 NOTE — Progress Notes (Signed)
Remote pacemaker transmission.   

## 2016-01-31 DIAGNOSIS — M6281 Muscle weakness (generalized): Secondary | ICD-10-CM | POA: Diagnosis not present

## 2016-01-31 DIAGNOSIS — I480 Paroxysmal atrial fibrillation: Secondary | ICD-10-CM | POA: Diagnosis not present

## 2016-01-31 DIAGNOSIS — R2689 Other abnormalities of gait and mobility: Secondary | ICD-10-CM | POA: Diagnosis not present

## 2016-02-02 DIAGNOSIS — R2689 Other abnormalities of gait and mobility: Secondary | ICD-10-CM | POA: Diagnosis not present

## 2016-02-02 DIAGNOSIS — I480 Paroxysmal atrial fibrillation: Secondary | ICD-10-CM | POA: Diagnosis not present

## 2016-02-02 DIAGNOSIS — M6281 Muscle weakness (generalized): Secondary | ICD-10-CM | POA: Diagnosis not present

## 2016-02-03 DIAGNOSIS — R2689 Other abnormalities of gait and mobility: Secondary | ICD-10-CM | POA: Diagnosis not present

## 2016-02-03 DIAGNOSIS — I480 Paroxysmal atrial fibrillation: Secondary | ICD-10-CM | POA: Diagnosis not present

## 2016-02-03 DIAGNOSIS — M6281 Muscle weakness (generalized): Secondary | ICD-10-CM | POA: Diagnosis not present

## 2016-02-04 DIAGNOSIS — N390 Urinary tract infection, site not specified: Secondary | ICD-10-CM | POA: Diagnosis not present

## 2016-02-04 DIAGNOSIS — I480 Paroxysmal atrial fibrillation: Secondary | ICD-10-CM | POA: Diagnosis not present

## 2016-02-04 DIAGNOSIS — M6281 Muscle weakness (generalized): Secondary | ICD-10-CM | POA: Diagnosis not present

## 2016-02-04 DIAGNOSIS — R319 Hematuria, unspecified: Secondary | ICD-10-CM | POA: Diagnosis not present

## 2016-02-04 DIAGNOSIS — R2689 Other abnormalities of gait and mobility: Secondary | ICD-10-CM | POA: Diagnosis not present

## 2016-02-04 DIAGNOSIS — Z79899 Other long term (current) drug therapy: Secondary | ICD-10-CM | POA: Diagnosis not present

## 2016-02-05 DIAGNOSIS — R2689 Other abnormalities of gait and mobility: Secondary | ICD-10-CM | POA: Diagnosis not present

## 2016-02-05 DIAGNOSIS — M6281 Muscle weakness (generalized): Secondary | ICD-10-CM | POA: Diagnosis not present

## 2016-02-05 DIAGNOSIS — I480 Paroxysmal atrial fibrillation: Secondary | ICD-10-CM | POA: Diagnosis not present

## 2016-02-06 DIAGNOSIS — M6281 Muscle weakness (generalized): Secondary | ICD-10-CM | POA: Diagnosis not present

## 2016-02-06 DIAGNOSIS — R2689 Other abnormalities of gait and mobility: Secondary | ICD-10-CM | POA: Diagnosis not present

## 2016-02-06 DIAGNOSIS — I480 Paroxysmal atrial fibrillation: Secondary | ICD-10-CM | POA: Diagnosis not present

## 2016-02-06 LAB — CUP PACEART REMOTE DEVICE CHECK
Battery Impedance: 5786 Ohm
Battery Remaining Longevity: 4 mo
Brady Statistic AP VP Percent: 2 %
Brady Statistic AS VP Percent: 3 %
Brady Statistic AS VS Percent: 10 %
Implantable Lead Implant Date: 20090723
Implantable Lead Location: 753860
Implantable Lead Model: 5076
Implantable Lead Model: 5076
Implantable Pulse Generator Implant Date: 20090723
Lead Channel Impedance Value: 488 Ohm
Lead Channel Impedance Value: 550 Ohm
Lead Channel Pacing Threshold Amplitude: 0.75 V
Lead Channel Pacing Threshold Amplitude: 0.875 V
Lead Channel Setting Pacing Amplitude: 2 V
MDC IDC LEAD IMPLANT DT: 20090723
MDC IDC LEAD LOCATION: 753859
MDC IDC MSMT BATTERY VOLTAGE: 2.65 V
MDC IDC MSMT LEADCHNL RA PACING THRESHOLD PULSEWIDTH: 0.4 ms
MDC IDC MSMT LEADCHNL RV PACING THRESHOLD PULSEWIDTH: 0.4 ms
MDC IDC SESS DTM: 20171114204715
MDC IDC SET LEADCHNL RA PACING AMPLITUDE: 1.5 V
MDC IDC SET LEADCHNL RV PACING PULSEWIDTH: 0.4 ms
MDC IDC SET LEADCHNL RV SENSING SENSITIVITY: 4 mV
MDC IDC STAT BRADY AP VS PERCENT: 85 %

## 2016-02-09 DIAGNOSIS — R2689 Other abnormalities of gait and mobility: Secondary | ICD-10-CM | POA: Diagnosis not present

## 2016-02-09 DIAGNOSIS — I480 Paroxysmal atrial fibrillation: Secondary | ICD-10-CM | POA: Diagnosis not present

## 2016-02-09 DIAGNOSIS — M6281 Muscle weakness (generalized): Secondary | ICD-10-CM | POA: Diagnosis not present

## 2016-02-10 ENCOUNTER — Encounter: Payer: Self-pay | Admitting: Internal Medicine

## 2016-02-10 ENCOUNTER — Non-Acute Institutional Stay (SKILLED_NURSING_FACILITY): Payer: Medicare Other | Admitting: Internal Medicine

## 2016-02-10 DIAGNOSIS — N3090 Cystitis, unspecified without hematuria: Secondary | ICD-10-CM

## 2016-02-10 DIAGNOSIS — M6281 Muscle weakness (generalized): Secondary | ICD-10-CM | POA: Diagnosis not present

## 2016-02-10 DIAGNOSIS — B9689 Other specified bacterial agents as the cause of diseases classified elsewhere: Secondary | ICD-10-CM

## 2016-02-10 DIAGNOSIS — I480 Paroxysmal atrial fibrillation: Secondary | ICD-10-CM | POA: Diagnosis not present

## 2016-02-10 DIAGNOSIS — R2689 Other abnormalities of gait and mobility: Secondary | ICD-10-CM | POA: Diagnosis not present

## 2016-02-10 NOTE — Progress Notes (Signed)
Location:  [facility] Room Number: 207W Place of Service:  SNF [phone])  [name]. Sheppard Coil, MD  Patient Care Team: No Pcp Per Patient as PCP - General (General Practice) Gayland Curry, DO as Consulting Physician (Geriatric Medicine)  Extended Emergency Contact Information Primary Emergency Contact: [name],Brandy Address: [address]          [place], [name] [address] [name] of [place] Phone: [phone] Mobile Phone: [phone] Relation: Daughter Secondary Emergency Contact: [name] Address: [place],  [address] [name] of [place] Phone: [phone] Work Phone: [phone] Mobile Phone: [phone] Relation: Daughter    Allergies: Nubain [nalbuphine hcl]; Mirtazapine; Pineapple; Statins; and Iodine  Chief Complaint  Patient presents with  . Acute Visit    Acute    HPI: Patient is 82 y.o. female who is being seen for an acute UTI. Pt's urine has a a strong and foul odor and has grown out > 100,000 Klebsiella pneumoniae.  Past Medical History:  Diagnosis Date  . Abdominal aortic aneurysm ([place])   . Anemia    hx  . Asthma    hx  . Atrial fibrillation ([place])   . Blood transfusion   . Blood transfusion without reported diagnosis   . CAD (coronary artery disease)   . COPD (chronic obstructive pulmonary disease) ([place])   . GERD (gastroesophageal reflux disease)   . GI bleed 2007  . H/O hiatal hernia   . Hypertension   . Hypothyroidism   . Myocardial infarction   . Osteoporosis   . Pacemaker   . Renal failure, chronic   . SSS (sick sinus syndrome) ([place]) 10/05/2007   Medtronic Adapta  . Stroke ([place])   . TIA (transient ischemic attack)   . UTI (lower urinary tract infection)    hx    Past Surgical History:  Procedure Laterality Date  . CARDIAC CATHETERIZATION    . COLONOSCOPY    . CORONARY STENT PLACEMENT     x9  . FRACTURE SURGERY     femur fx, /w ORIF into HIP-  2008  . NM MYOCAR PERF WALL MOTION  10/08/2010   mild apical anterior ischemia  . PACEMAKER INSERTION  10/05/2007   Medtronic Adapta  . TOTAL HIP ARTHROPLASTY     planned for 08/27/2011 Left  . TOTAL HIP ARTHROPLASTY  08/27/2011   Procedure: TOTAL HIP ARTHROPLASTY;  Surgeon: Kerin Salen, MD;  Location: [facility];  Service: Orthopedics;  Laterality: Right;  . US ECHOCARDIOGRAPHY  08/23/2011   EF 50%,LA mod. dilated,mild to mod. mitral annular ca+,mod. MR,mild to mod TR,mild to mod. PH,trace AI.      Medication List       Accurate as of 02/10/16 11:59 PM. Always use your most recent med list.          ABREVA 10 % Crea Generic drug:  Docosanol Apply topically. Apply to affected area on lip  5 x day until healed   acetaminophen 325 MG tablet Commonly known as:  TYLENOL Take 325 mg by mouth at bedtime.   acetaminophen 325 MG tablet Commonly known as:  TYLENOL Take 650 mg by mouth every 6 (six) hours as needed for mild pain.   amiodarone 200 MG tablet Commonly known as:  PACERONE TAKE 1 TABLET BY MOUTH EVERY DAY   Biotin 1000 MCG tablet Take 1,000 mcg by mouth daily.   CALCIUM 600+D 600-400 MG-UNIT tablet Generic  drug:  Calcium Carbonate-Vitamin D Take 1 tablet by mouth daily.   clopidogrel 75 MG tablet Commonly known as:  PLAVIX TAKE 1 TABLET (75 MG TOTAL) BY MOUTH DAILY.   cycloSPORINE 0.05 % ophthalmic emulsion Commonly known as:  RESTASIS Place 1 drop into both eyes every 12 (twelve) hours.   feeding supplement Liqd Take 237 mLs by mouth daily.   ferrous sulfate 325 (65 FE) MG tablet Take 325 mg by mouth daily.   furosemide 20 MG tablet Commonly known as:  LASIX Take 1 tablet (20 mg total) by mouth daily.   levothyroxine 125 MCG tablet Commonly known as:  SYNTHROID, LEVOTHROID Take 125 mcg by mouth daily before breakfast.   nitroGLYCERIN 0.4 MG SL tablet Commonly known as:  NITROSTAT Place 1 tablet (0.4 mg total) under the tongue every 5 (five) minutes as  needed for chest pain (For a total of 3 tablets, if chest pain persist to call 911).   OXYGEN Inhale 2 L into the lungs as needed (to keep O2 sat above 90%).   pantoprazole 40 MG tablet Commonly known as:  PROTONIX TAKE 1 TABLET (40 MG TOTAL) BY MOUTH DAILY.   potassium chloride 10 MEQ tablet Commonly known as:  K-DUR Take 10 mEq by mouth 2 (two) times daily with a meal. 9am, 6pm   promethazine 25 MG tablet Commonly known as:  PHENERGAN Take 25 mg by mouth every 6 (six) hours as needed for nausea or vomiting.   rOPINIRole 0.25 MG tablet Commonly known as:  REQUIP Take 0.25 mg by mouth at bedtime.   SYSTANE BALANCE 0.6 % Soln Generic drug:  Propylene Glycol Place 1 drop into the right eye 3 (three) times daily as needed.   vitamin B-12 1000 MCG tablet Commonly known as:  CYANOCOBALAMIN Take 1,000 mcg by mouth daily.       Meds ordered this encounter  Medications  . Propylene Glycol (SYSTANE BALANCE) 0.6 % SOLN    Sig: Place 1 drop into the right eye 3 (three) times daily as needed.    Immunization History  Administered Date(s) Administered  . Influenza,inj,Quad PF,36+ Mos 12/18/2013, 12/09/2014  . Influenza-Unspecified 12/16/2015  . PPD Test 02/02/2015, 02/27/2015  . Pneumococcal Conjugate-13 12/09/2014    Social History  Substance Use Topics  . Smoking status: Former Smoker    Types: Cigarettes  . Smokeless tobacco: Never Used     Comment: quit 40 yrs ago- was smoking 2 packs a day at the most  . Alcohol use No    Review of Systems  DATA OBTAINED: from patient, nurse GENERAL:  no fevers, fatigue, appetite changes SKIN: No itching, rash HEENT: No complaint RESPIRATORY: No cough, wheezing, SOB CARDIAC: No chest pain, palpitations, lower extremity edema  GI: No abdominal pain, No N/V/D or constipation, No heartburn or reflux  GU: No dysuria, frequency or urgency, or incontinence; urine with odor  MUSCULOSKELETAL: No unrelieved bone/joint pain NEUROLOGIC:  No headache, dizziness  PSYCHIATRIC: No overt anxiety or sadness  Vitals:   02/10/16 0936  BP: 112/71  Pulse: 67  Resp: 16  Temp: 97.3 F (36.3 C)   Body mass index is 21.01 kg/m. Physical Exam  GENERAL APPEARANCE: Alert, conversant, No acute distress  SKIN: No diaphoresis rash HEENT: Unremarkable RESPIRATORY: Breathing is even, unlabored. Lung sounds are clear   CARDIOVASCULAR: Heart RRR no murmurs, rubs or gallops. No peripheral edema  GASTROINTESTINAL: Abdomen is soft, non-tender, not distended w/ normal bowel sounds.  GENITOURINARY: Bladder non tender, not distended  MUSCULOSKELETAL: No abnormal joints or musculature NEUROLOGIC: Cranial nerves 2-12 grossly intact. Moves all extremities PSYCHIATRIC: Mood and affect appropriate to situation, no behavioral issues  Patient Active Problem List   Diagnosis Date Noted  . COPD (chronic obstructive pulmonary disease) ([place]) 11/29/2015  . Abdominal aortic aneurysm (AAA) without rupture ([place]) 08/21/2015  . DNR (do not resuscitate)   . Weakness generalized   . GERD (gastroesophageal reflux disease) 07/04/2015  . Dysphagia 07/04/2015  . Transaminitis 06/24/2015  . HCAP (healthcare-associated pneumonia) 06/22/2015  . Pneumonia 06/22/2015  . COPD exacerbation ([place]) 06/21/2015  . Cough 06/09/2015  . Fever, unspecified 06/09/2015  . Chronic diastolic congestive heart failure ([place]) 04/24/2015  . Restless legs 04/24/2015  . Restless leg 04/24/2015  . Pain and swelling of left lower extremity 02/15/2015  . Candidiasis of perineum 02/15/2015  . Hemorrhoids 02/15/2015  . CKD (chronic kidney disease) stage 3, GFR 30-59 ml/min 02/06/2015  . Acute respiratory failure with hypoxia ([place]) 01/29/2015  . Acute pulmonary edema ([place]) 01/29/2015  . SOB (shortness of breath)   . Palliative care encounter   . Sepsis ([place]) 01/26/2015  . Cellulitis of leg, left 01/26/2015  . Protein-calorie malnutrition, severe ([place]) 11/06/2014  . Pressure ulcer  11/06/2014  . Acute diastolic CHF (congestive heart failure) ([place]) 11/04/2014  . Weakness 11/04/2014  . Hypertensive heart disease with CHF (congestive heart failure) ([place]) 11/04/2014  . Macrocytic anemia 11/04/2014  . Cardiomyopathy, ischemic 11/04/2014  . CHF exacerbation ([employer]) 11/03/2014  . Body mass index (BMI) of 20.0-20.9 in adult 06/23/2014  . Hard of hearing 06/23/2014  . Rectal bleeding 07/16/2013  . Hyperlipidemia, statin intolerance 04/30/2013  . Paroxysmal atrial fibrillation ([place]) 04/30/2013  . Pacemaker - dual chamber Medtronic 2009 04/30/2013  . AAA (abdominal aortic aneurysm) ([place]) 04/30/2013  . Bilateral carotid artery stenosis 04/30/2013  . PAD (peripheral artery disease) ([place]) 04/30/2013  . Constipation 08/31/2011  . Hypotension 08/28/2011  . S/P total hip arthroplasty 08/28/2011  . CAD (coronary artery disease) 08/28/2011  . Hypothyroid 08/28/2011  . Klebsiella cystitis 08/28/2011  . Thrombocytopenia ([place]) 08/28/2011  . SSS (sick sinus syndrome) ([facility]) 10/05/2007    CMP     Component Value Date/Time   NA 137 11/12/2015   K 4.8 11/12/2015   CL 106 06/26/2015 0540   CO2 27 06/26/2015 0540   GLUCOSE 107 (H) 06/26/2015 0540   BUN 27 (A) 11/12/2015   CREATININE 1.4 (A) 11/12/2015   CREATININE 1.17 (H) 06/26/2015 0540   CREATININE 1.16 (H) 01/22/2015 1203   CALCIUM 8.5 (L) 06/26/2015 0540   PROT 4.9 (L) 06/24/2015 0648   PROT 6.0 08/01/2014 1401   ALBUMIN 2.1 (L) 06/26/2015 0540   ALBUMIN 3.9 08/01/2014 1401   AST 52 (A) 11/12/2015   ALT 36 (A) 11/12/2015   ALKPHOS 80 11/12/2015   BILITOT 0.7 06/24/2015 0648   BILITOT 0.3 08/01/2014 1401   GFRNONAA 38 (L) 06/26/2015 0540   GFRAA 44 (L) 06/26/2015 0540    Recent Labs  06/23/15 0352 06/24/15 0648 06/26/15 0540 10/22/15 11/12/15  NA 144 143 141 137 137  K 4.6 4.4 4.4 4.3 4.8  CL 105 107 106  --   --   CO2 32 29 27  --   --   GLUCOSE 88 80 107*  --   --   BUN 27* 18 12 28* 27*  CREATININE 1.50*  1.14* 1.17* 1.5* 1.4*  CALCIUM 8.2* 8.5* 8.5*  --   --   PHOS  --   --  2.1*  --   --     Recent Labs  06/22/15 1609 06/23/15 0352 06/24/15 0648 06/26/15 0540 11/12/15  AST 123* 98* 88*  --  52*  ALT 71* 60* 57*  --  36*  ALKPHOS 89 66 73  --  80  BILITOT 0.8 0.9 0.7  --   --   PROT 5.8* 4.8* 4.9*  --   --   ALBUMIN 2.6* 2.1* 2.2* 2.1*  --     Recent Labs  06/22/15 1609 06/23/15 0352 06/24/15 0648 10/22/15 11/12/15  WBC 8.7 6.2 5.8 6.7 6.1  NEUTROABS 6.6 4.3  --   --   --   HGB 12.3 10.7* 11.1* 12.4 12.0  HCT 38.9 34.5* 36.5 39 38  MCV 96.8 96.6 97.9  --   --   PLT 197 178 185 172 175    Recent Labs  11/12/15  CHOL 133  LDLCALC 68  TRIG 137   No results found for: [facility] Lab Results  Component Value Date   TSH 0.52 11/12/2015   Lab Results  Component Value Date   HGBA1C 5.7 11/12/2015   Lab Results  Component Value Date   CHOL 133 11/12/2015   HDL 38 11/12/2015   LDLCALC 68 11/12/2015   TRIG 137 11/12/2015   CHOLHDL 3.3 12/01/2012    Significant Diagnostic Results in last 30 days:  No results found.  Assessment and Plan  Klebsiella cystitis > 100,000; pan sensitive; Cr is 1.38, CrCl is 26.3; will treat with suprax 200 mg BID for 7 days   Time spent > 25 min Makalyn Lennox D. Sheppard Coil, MD

## 2016-02-11 ENCOUNTER — Encounter: Payer: Self-pay | Admitting: Internal Medicine

## 2016-02-11 ENCOUNTER — Non-Acute Institutional Stay (SKILLED_NURSING_FACILITY): Payer: Medicare Other | Admitting: Internal Medicine

## 2016-02-11 DIAGNOSIS — H903 Sensorineural hearing loss, bilateral: Secondary | ICD-10-CM

## 2016-02-11 DIAGNOSIS — K219 Gastro-esophageal reflux disease without esophagitis: Secondary | ICD-10-CM

## 2016-02-11 DIAGNOSIS — E785 Hyperlipidemia, unspecified: Secondary | ICD-10-CM

## 2016-02-11 DIAGNOSIS — I5032 Chronic diastolic (congestive) heart failure: Secondary | ICD-10-CM

## 2016-02-11 DIAGNOSIS — R1310 Dysphagia, unspecified: Secondary | ICD-10-CM

## 2016-02-11 DIAGNOSIS — G2581 Restless legs syndrome: Secondary | ICD-10-CM

## 2016-02-11 DIAGNOSIS — N3090 Cystitis, unspecified without hematuria: Secondary | ICD-10-CM

## 2016-02-11 DIAGNOSIS — I480 Paroxysmal atrial fibrillation: Secondary | ICD-10-CM | POA: Diagnosis not present

## 2016-02-11 DIAGNOSIS — I714 Abdominal aortic aneurysm, without rupture, unspecified: Secondary | ICD-10-CM

## 2016-02-11 DIAGNOSIS — I110 Hypertensive heart disease with heart failure: Secondary | ICD-10-CM

## 2016-02-11 DIAGNOSIS — E034 Atrophy of thyroid (acquired): Secondary | ICD-10-CM | POA: Diagnosis not present

## 2016-02-11 DIAGNOSIS — I2510 Atherosclerotic heart disease of native coronary artery without angina pectoris: Secondary | ICD-10-CM | POA: Diagnosis not present

## 2016-02-11 DIAGNOSIS — D539 Nutritional anemia, unspecified: Secondary | ICD-10-CM

## 2016-02-11 DIAGNOSIS — I255 Ischemic cardiomyopathy: Secondary | ICD-10-CM

## 2016-02-11 DIAGNOSIS — N183 Chronic kidney disease, stage 3 unspecified: Secondary | ICD-10-CM

## 2016-02-11 DIAGNOSIS — B9689 Other specified bacterial agents as the cause of diseases classified elsewhere: Secondary | ICD-10-CM

## 2016-02-11 DIAGNOSIS — Z950 Presence of cardiac pacemaker: Secondary | ICD-10-CM

## 2016-02-11 DIAGNOSIS — I739 Peripheral vascular disease, unspecified: Secondary | ICD-10-CM

## 2016-02-11 DIAGNOSIS — J449 Chronic obstructive pulmonary disease, unspecified: Secondary | ICD-10-CM | POA: Diagnosis not present

## 2016-02-11 NOTE — Progress Notes (Signed)
Location:  [facility] Room Number: 207W Place of Service:  SNF [phone])  [name], MD  PCP: No PCP Per Patient Patient Care Team: No Pcp Per Patient as PCP - General (General Practice) Gayland Curry, DO as Consulting Physician (Geriatric Medicine)  Extended Emergency Contact Information Primary Emergency Contact: [name],[name] Address: [address]          [place], [place] [address] [name] of [place] Phone: [phone] Mobile Phone: [phone] Relation: Daughter Secondary Emergency Contact: [name] Address: [place], [place] [address] [name] of [place] Phone: [phone] Work Phone: [phone] Mobile Phone: [phone] Relation: Daughter  Allergies  Allergen Reactions  . Nubain [Nalbuphine Hcl] Anaphylaxis  . Mirtazapine Other (See Comments)    "Weakness in legs"  . Pineapple Other (See Comments)    Listed on [facility] 06/2015  . Statins Other (See Comments)    Leg weakness  . Iodine Rash    Chief Complaint  Patient presents with  . Discharge Note    Discharged from SNF    HPI:  82 y.o. female  with CAD, PVD, Afib, SSS on PPM, TIA, COPD, hypothyroidism, CKD, hernia who was admitted to SNF on 06/26/2015 after being hospitalized for aspiration PNA. Pt has been a resident since but her daughter is now retiring and can take her home. She is being d/c to home 02/12/2016.    Past Medical History:  Diagnosis Date  . Abdominal aortic aneurysm ([place])   . Anemia    hx  . Asthma    hx  . Atrial fibrillation ([place])   . Blood transfusion   . Blood transfusion without reported diagnosis   . CAD (coronary artery disease)   . COPD (chronic obstructive pulmonary disease) ([place])   . GERD (gastroesophageal reflux disease)   . GI bleed 2007  . H/O hiatal hernia   . Hypertension   . Hypothyroidism   . Myocardial infarction   . Osteoporosis   . Pacemaker   . Renal failure, chronic   .  SSS (sick sinus syndrome) ([place]) 10/05/2007   Medtronic Adapta  . Stroke ([place])   . TIA (transient ischemic attack)   . UTI (lower urinary tract infection)    hx    Past Surgical History:  Procedure Laterality Date  . CARDIAC CATHETERIZATION    . COLONOSCOPY    . CORONARY STENT PLACEMENT     x9  . FRACTURE SURGERY     femur fx, /w ORIF into HIP- 2008  . NM MYOCAR PERF WALL MOTION  10/08/2010   mild apical anterior ischemia  . PACEMAKER INSERTION  10/05/2007   Medtronic Adapta  . TOTAL HIP ARTHROPLASTY     planned for 08/27/2011 Left  . TOTAL HIP ARTHROPLASTY  08/27/2011   Procedure: TOTAL HIP ARTHROPLASTY;  Surgeon: Kerin Salen, MD;  Location: [facility];  Service: Orthopedics;  Laterality: Right;  . US ECHOCARDIOGRAPHY  08/23/2011   EF 50%,LA mod. dilated,mild to mod. mitral annular ca+,mod. MR,mild to mod TR,mild to mod. PH,trace AI.     reports that she has quit smoking. Her smoking use included Cigarettes. She has never used smokeless tobacco. She reports that she does not drink alcohol or use drugs. Social History   Social History  . Marital status: Widowed    Spouse name: N/A  . Number of children: 4  . Years of education: N/A   Occupational  History  . Retired    Social History Main Topics  . Smoking status: Former Smoker    Types: Cigarettes  . Smokeless tobacco: Never Used     Comment: quit 40 yrs ago- was smoking 2 packs a day at the most  . Alcohol use No  . Drug use: No  . Sexual activity: Yes    Birth control/ protection: Post-menopausal   Other Topics Concern  . Not on file   Social History Narrative   Diet:      Do you drink/ eat things with caffeine? yes      Marital status:    widow                           What year were you married ?  1940      Do you live in a house, apartment,assistred living, condo, trailer, etc.)?  Mobile home      Is it one or more stories? 1      How many persons live in your home ?  1      Do you have any pets in your  home ?(please list)  1 cat      Current or past profession: factory worker      Do you exercise?  yes                           Type & how often: small amount      Do you have a living will?  no      Do you have a DNR form?     no                  If not, do you want to discuss one?       Do you have signed POA?HPOA forms?  no               If so, please bring to your        appointment          Pertinent  Health Maintenance Due  Topic Date Due  . PNA vac Low Risk Adult (2 of 2 - PPSV23) 03/15/2016 (Originally 12/09/2015)  . INFLUENZA VACCINE  Completed  . DEXA SCAN  Completed    Medications:   Medication List       Accurate as of 02/11/16 11:06 AM. Always use your most recent med list.          ABREVA 10 % Crea Generic drug:  Docosanol Apply topically. Apply to affected area on lip  5 x day until healed   acetaminophen 325 MG tablet Commonly known as:  TYLENOL Take 325 mg by mouth at bedtime.   acetaminophen 325 MG tablet Commonly known as:  TYLENOL Take 650 mg by mouth every 6 (six) hours as needed for mild pain.   amiodarone 200 MG tablet Commonly known as:  PACERONE TAKE 1 TABLET BY MOUTH EVERY DAY   Biotin 1000 MCG tablet Take 1,000 mcg by mouth daily.   CALCIUM 600+D 600-400 MG-UNIT tablet Generic drug:  Calcium Carbonate-Vitamin D Take 1 tablet by mouth daily.   clopidogrel 75 MG tablet Commonly known as:  PLAVIX TAKE 1 TABLET (75 MG TOTAL) BY MOUTH DAILY.   cycloSPORINE 0.05 % ophthalmic emulsion Commonly known as:  RESTASIS Place 1 drop into both eyes every 12 (twelve) hours.   feeding supplement Liqd  Take 237 mLs by mouth daily.   ferrous sulfate 325 (65 FE) MG tablet Take 325 mg by mouth daily.   furosemide 20 MG tablet Commonly known as:  LASIX Take 1 tablet (20 mg total) by mouth daily.   levothyroxine 125 MCG tablet Commonly known as:  SYNTHROID, LEVOTHROID Take 125 mcg by mouth daily before breakfast.   nitroGLYCERIN 0.4 MG  SL tablet Commonly known as:  NITROSTAT Place 1 tablet (0.4 mg total) under the tongue every 5 (five) minutes as needed for chest pain (For a total of 3 tablets, if chest pain persist to call 911).   OXYGEN Inhale 2 L into the lungs as needed (to keep O2 sat above 90%).   pantoprazole 40 MG tablet Commonly known as:  PROTONIX TAKE 1 TABLET (40 MG TOTAL) BY MOUTH DAILY.   potassium chloride 10 MEQ tablet Commonly known as:  K-DUR Take 10 mEq by mouth 2 (two) times daily with a meal. 9am, 6pm   promethazine 25 MG tablet Commonly known as:  PHENERGAN Take 25 mg by mouth every 6 (six) hours as needed for nausea or vomiting.   rOPINIRole 0.25 MG tablet Commonly known as:  REQUIP Take 0.25 mg by mouth at bedtime.   SYSTANE BALANCE 0.6 % Soln Generic drug:  Propylene Glycol Place 1 drop into the right eye 3 (three) times daily as needed.   vitamin B-12 1000 MCG tablet Commonly known as:  CYANOCOBALAMIN Take 1,000 mcg by mouth daily.        Vitals:   02/11/16 1016  BP: 112/71  Pulse: 67  Resp: 16  Temp: 97.3 F (36.3 C)  Weight: 111 lb (50.3 kg)  Height: 5\' 1"  (1.549 m)   Body mass index is 20.97 kg/m.  Physical Exam  GENERAL APPEARANCE: Alert, conversant. No acute distress.  HEENT: Unremarkable. RESPIRATORY: Breathing is even, unlabored. Lung sounds are clear   CARDIOVASCULAR: Heart RRR no murmurs, rubs or gallops. No peripheral edema.  GASTROINTESTINAL: Abdomen is soft, non-tender, not distended w/ normal bowel sounds.  NEUROLOGIC: Cranial nerves 2-12 grossly intact. Moves all extremities   Labs reviewed: Basic Metabolic Panel:  Recent Labs  06/23/15 0352 06/24/15 0648 06/26/15 0540 10/22/15 11/12/15  NA 144 143 141 137 137  K 4.6 4.4 4.4 4.3 4.8  CL 105 107 106  --   --   CO2 32 29 27  --   --   GLUCOSE 88 80 107*  --   --   BUN 27* 18 12 28* 27*  CREATININE 1.50* 1.14* 1.17* 1.5* 1.4*  CALCIUM 8.2* 8.5* 8.5*  --   --   PHOS  --   --  2.1*  --   --     No results found for: [facility] Liver Function Tests:  Recent Labs  06/22/15 1609 06/23/15 0352 06/24/15 0648 06/26/15 0540 11/12/15  AST 123* 98* 88*  --  52*  ALT 71* 60* 57*  --  36*  ALKPHOS 89 66 73  --  80  BILITOT 0.8 0.9 0.7  --   --   PROT 5.8* 4.8* 4.9*  --   --   ALBUMIN 2.6* 2.1* 2.2* 2.1*  --    No results for input(s): LIPASE, AMYLASE in the last 8760 hours. No results for input(s): AMMONIA in the last 8760 hours. CBC:  Recent Labs  06/22/15 1609 06/23/15 0352 06/24/15 0648 10/22/15 11/12/15  WBC 8.7 6.2 5.8 6.7 6.1  NEUTROABS 6.6 4.3  --   --   --  HGB 12.3 10.7* 11.1* 12.4 12.0  HCT 38.9 34.5* 36.5 39 38  MCV 96.8 96.6 97.9  --   --   PLT 197 178 185 172 175   Lipid  Recent Labs  11/12/15  CHOL 133  HDL 38  LDLCALC 68  TRIG 137   Cardiac Enzymes: No results for input(s): CKTOTAL, CKMB, CKMBINDEX, TROPONINI in the last 8760 hours. BNP:  Recent Labs  03/10/15 2225  BNP 182.3*   CBG: No results for input(s): GLUCAP in the last 8760 hours.  Procedures and Imaging Studies During Stay: No results found.  Assessment/Plan:   No diagnosis found.   Patient is being discharged with the following home health services:  HH/OT/PT/Nursing  Patient is being discharged with the following durable medical equipment:  Wheelchair and bed enablers ( halos)  Patient has been advised to f/u with their PCP in 1-2 weeks to bring them up to date on their rehab stay.  Social services at facility was responsible for arranging this appointment.  Pt was provided with a 30 day supply of prescriptions for medications and refills must be obtained from their PCP.  For controlled substances, a more limited supply may be provided adequate until PCP appointment only.  Pt will need 5 more days of suprax for a Klebsiella UTI.   Time spent . 30 min;> 50% of time with patient was spent reviewing records, labs, tests and studies, counseling and developing plan of  care  [name], MD

## 2016-02-15 ENCOUNTER — Encounter: Payer: Self-pay | Admitting: Internal Medicine

## 2016-02-15 NOTE — Assessment & Plan Note (Signed)
>   100,000; pan sensitive; Cr is 1.38, CrCl is 26.3; will treat with suprax 200 mg BID for 7 days

## 2016-02-16 ENCOUNTER — Telehealth: Payer: Self-pay | Admitting: Cardiovascular Disease

## 2016-02-16 DIAGNOSIS — I714 Abdominal aortic aneurysm, without rupture, unspecified: Secondary | ICD-10-CM

## 2016-02-16 DIAGNOSIS — I739 Peripheral vascular disease, unspecified: Secondary | ICD-10-CM

## 2016-02-16 NOTE — Telephone Encounter (Signed)
Pt daughter is calling to see when her mother would get the dopplers on her legs again

## 2016-02-16 NOTE — Telephone Encounter (Signed)
Spoke with daughter she states that she is calling to follow up on pt, she states that in the past pt has had dopplers on both of her LE with Dr Rollene Fare. But she has not had them done for years she is wondering if she needs to have them done again pt has stent in right leg(per daughter) and also pt has abdominal aneurysm (I see last CT was 01-2015) when should pt repeat? Please advise.

## 2016-02-18 NOTE — Telephone Encounter (Signed)
Please letb her know that we will schedule her for duplex US on both the lower extremity arterial system and the AAA. [place], can you please order?

## 2016-02-19 ENCOUNTER — Other Ambulatory Visit: Payer: Self-pay | Admitting: Physician Assistant

## 2016-02-19 MED ORDER — OSELTAMIVIR PHOSPHATE 75 MG PO CAPS: 75.0000 mg | ORAL_CAPSULE | Freq: Every day | ORAL | 0 refills | Status: DC

## 2016-02-19 NOTE — Telephone Encounter (Signed)
Tests ordered  Daughter Pamala Hurry notified she will await scheduling call

## 2016-02-19 NOTE — Progress Notes (Unsigned)
Daughter was here and was diagnosed with influenza A and had had the flu shot.  Prescribing Tamiflu proph as flu may be fatal in this patient. Philis Fendt, MS, PA-C 12:28 PM, 02/19/2016

## 2016-02-23 ENCOUNTER — Telehealth: Payer: Self-pay | Admitting: Cardiology

## 2016-02-23 ENCOUNTER — Ambulatory Visit (INDEPENDENT_AMBULATORY_CARE_PROVIDER_SITE_OTHER): Payer: Medicare Other | Admitting: *Deleted

## 2016-02-23 DIAGNOSIS — I495 Sick sinus syndrome: Secondary | ICD-10-CM

## 2016-02-23 NOTE — Telephone Encounter (Signed)
LMOVM reminding pt to send remote transmission.   

## 2016-02-24 NOTE — Progress Notes (Signed)
Remote pacemaker transmission.   

## 2016-02-27 ENCOUNTER — Encounter: Payer: Self-pay | Admitting: Cardiology

## 2016-03-04 LAB — CUP PACEART REMOTE DEVICE CHECK
Battery Voltage: 2.66 V
Brady Statistic AP VS Percent: 83 %
Brady Statistic AS VS Percent: 11 %
Date Time Interrogation Session: 20171211183107
Implantable Lead Implant Date: 20090723
Implantable Lead Implant Date: 20090723
Implantable Lead Location: 753860
Lead Channel Impedance Value: 497 Ohm
Lead Channel Pacing Threshold Pulse Width: 0.4 ms
Lead Channel Setting Pacing Amplitude: 2 V
Lead Channel Setting Sensing Sensitivity: 5.6 mV
MDC IDC LEAD LOCATION: 753859
MDC IDC MSMT BATTERY IMPEDANCE: 6017 Ohm
MDC IDC MSMT BATTERY REMAINING LONGEVITY: 3 mo
MDC IDC MSMT LEADCHNL RA IMPEDANCE VALUE: 557 Ohm
MDC IDC MSMT LEADCHNL RA PACING THRESHOLD AMPLITUDE: 0.75 V
MDC IDC MSMT LEADCHNL RA PACING THRESHOLD PULSEWIDTH: 0.4 ms
MDC IDC MSMT LEADCHNL RV PACING THRESHOLD AMPLITUDE: 0.75 V
MDC IDC MSMT LEADCHNL RV SENSING INTR AMPL: 11.2 mV
MDC IDC PG IMPLANT DT: 20090723
MDC IDC SET LEADCHNL RA PACING AMPLITUDE: 1.5 V
MDC IDC SET LEADCHNL RV PACING PULSEWIDTH: 0.4 ms
MDC IDC STAT BRADY AP VP PERCENT: 2 %
MDC IDC STAT BRADY AS VP PERCENT: 4 %

## 2016-03-07 ENCOUNTER — Other Ambulatory Visit: Payer: Self-pay | Admitting: Internal Medicine

## 2016-03-07 DIAGNOSIS — I509 Heart failure, unspecified: Secondary | ICD-10-CM

## 2016-03-17 DIAGNOSIS — M810 Age-related osteoporosis without current pathological fracture: Secondary | ICD-10-CM | POA: Diagnosis not present

## 2016-03-17 DIAGNOSIS — I10 Essential (primary) hypertension: Secondary | ICD-10-CM | POA: Diagnosis not present

## 2016-03-17 DIAGNOSIS — Z7901 Long term (current) use of anticoagulants: Secondary | ICD-10-CM | POA: Diagnosis not present

## 2016-03-17 DIAGNOSIS — Z9181 History of falling: Secondary | ICD-10-CM | POA: Diagnosis not present

## 2016-03-17 DIAGNOSIS — I480 Paroxysmal atrial fibrillation: Secondary | ICD-10-CM | POA: Diagnosis not present

## 2016-03-17 DIAGNOSIS — Z950 Presence of cardiac pacemaker: Secondary | ICD-10-CM | POA: Diagnosis not present

## 2016-03-17 DIAGNOSIS — Z8781 Personal history of (healed) traumatic fracture: Secondary | ICD-10-CM | POA: Diagnosis not present

## 2016-03-17 DIAGNOSIS — I2510 Atherosclerotic heart disease of native coronary artery without angina pectoris: Secondary | ICD-10-CM | POA: Diagnosis not present

## 2016-03-18 ENCOUNTER — Ambulatory Visit: Payer: Medicare Other | Admitting: Family Medicine

## 2016-03-18 DIAGNOSIS — M810 Age-related osteoporosis without current pathological fracture: Secondary | ICD-10-CM | POA: Diagnosis not present

## 2016-03-18 DIAGNOSIS — Z7901 Long term (current) use of anticoagulants: Secondary | ICD-10-CM | POA: Diagnosis not present

## 2016-03-18 DIAGNOSIS — Z950 Presence of cardiac pacemaker: Secondary | ICD-10-CM | POA: Diagnosis not present

## 2016-03-18 DIAGNOSIS — Z8781 Personal history of (healed) traumatic fracture: Secondary | ICD-10-CM | POA: Diagnosis not present

## 2016-03-18 DIAGNOSIS — I10 Essential (primary) hypertension: Secondary | ICD-10-CM | POA: Diagnosis not present

## 2016-03-18 DIAGNOSIS — Z9181 History of falling: Secondary | ICD-10-CM | POA: Diagnosis not present

## 2016-03-18 DIAGNOSIS — I480 Paroxysmal atrial fibrillation: Secondary | ICD-10-CM | POA: Diagnosis not present

## 2016-03-18 DIAGNOSIS — I2510 Atherosclerotic heart disease of native coronary artery without angina pectoris: Secondary | ICD-10-CM | POA: Diagnosis not present

## 2016-03-22 DIAGNOSIS — Z8781 Personal history of (healed) traumatic fracture: Secondary | ICD-10-CM | POA: Diagnosis not present

## 2016-03-22 DIAGNOSIS — Z950 Presence of cardiac pacemaker: Secondary | ICD-10-CM | POA: Diagnosis not present

## 2016-03-22 DIAGNOSIS — I10 Essential (primary) hypertension: Secondary | ICD-10-CM | POA: Diagnosis not present

## 2016-03-22 DIAGNOSIS — Z9181 History of falling: Secondary | ICD-10-CM | POA: Diagnosis not present

## 2016-03-22 DIAGNOSIS — Z7901 Long term (current) use of anticoagulants: Secondary | ICD-10-CM | POA: Diagnosis not present

## 2016-03-22 DIAGNOSIS — I2510 Atherosclerotic heart disease of native coronary artery without angina pectoris: Secondary | ICD-10-CM | POA: Diagnosis not present

## 2016-03-22 DIAGNOSIS — I480 Paroxysmal atrial fibrillation: Secondary | ICD-10-CM | POA: Diagnosis not present

## 2016-03-22 DIAGNOSIS — M810 Age-related osteoporosis without current pathological fracture: Secondary | ICD-10-CM | POA: Diagnosis not present

## 2016-03-23 DIAGNOSIS — Z9181 History of falling: Secondary | ICD-10-CM | POA: Diagnosis not present

## 2016-03-23 DIAGNOSIS — M810 Age-related osteoporosis without current pathological fracture: Secondary | ICD-10-CM | POA: Diagnosis not present

## 2016-03-23 DIAGNOSIS — Z950 Presence of cardiac pacemaker: Secondary | ICD-10-CM | POA: Diagnosis not present

## 2016-03-23 DIAGNOSIS — Z7901 Long term (current) use of anticoagulants: Secondary | ICD-10-CM | POA: Diagnosis not present

## 2016-03-23 DIAGNOSIS — Z8781 Personal history of (healed) traumatic fracture: Secondary | ICD-10-CM | POA: Diagnosis not present

## 2016-03-23 DIAGNOSIS — I2510 Atherosclerotic heart disease of native coronary artery without angina pectoris: Secondary | ICD-10-CM | POA: Diagnosis not present

## 2016-03-23 DIAGNOSIS — I480 Paroxysmal atrial fibrillation: Secondary | ICD-10-CM | POA: Diagnosis not present

## 2016-03-23 DIAGNOSIS — I10 Essential (primary) hypertension: Secondary | ICD-10-CM | POA: Diagnosis not present

## 2016-03-24 DIAGNOSIS — Z8781 Personal history of (healed) traumatic fracture: Secondary | ICD-10-CM | POA: Diagnosis not present

## 2016-03-24 DIAGNOSIS — Z7901 Long term (current) use of anticoagulants: Secondary | ICD-10-CM | POA: Diagnosis not present

## 2016-03-24 DIAGNOSIS — M810 Age-related osteoporosis without current pathological fracture: Secondary | ICD-10-CM | POA: Diagnosis not present

## 2016-03-24 DIAGNOSIS — I2510 Atherosclerotic heart disease of native coronary artery without angina pectoris: Secondary | ICD-10-CM | POA: Diagnosis not present

## 2016-03-24 DIAGNOSIS — Z950 Presence of cardiac pacemaker: Secondary | ICD-10-CM | POA: Diagnosis not present

## 2016-03-24 DIAGNOSIS — Z9181 History of falling: Secondary | ICD-10-CM | POA: Diagnosis not present

## 2016-03-24 DIAGNOSIS — I10 Essential (primary) hypertension: Secondary | ICD-10-CM | POA: Diagnosis not present

## 2016-03-24 DIAGNOSIS — I480 Paroxysmal atrial fibrillation: Secondary | ICD-10-CM | POA: Diagnosis not present

## 2016-03-25 ENCOUNTER — Other Ambulatory Visit: Payer: Self-pay

## 2016-03-25 ENCOUNTER — Ambulatory Visit (INDEPENDENT_AMBULATORY_CARE_PROVIDER_SITE_OTHER): Payer: Medicare HMO | Admitting: Adult Health

## 2016-03-25 ENCOUNTER — Encounter: Payer: Self-pay | Admitting: Adult Health

## 2016-03-25 VITALS — BP 120/79 | HR 88 | Ht 62.0 in | Wt 101.9 lb

## 2016-03-25 DIAGNOSIS — I495 Sick sinus syndrome: Secondary | ICD-10-CM

## 2016-03-25 DIAGNOSIS — I5032 Chronic diastolic (congestive) heart failure: Secondary | ICD-10-CM

## 2016-03-25 DIAGNOSIS — I739 Peripheral vascular disease, unspecified: Secondary | ICD-10-CM

## 2016-03-25 DIAGNOSIS — M79605 Pain in left leg: Secondary | ICD-10-CM

## 2016-03-25 DIAGNOSIS — I480 Paroxysmal atrial fibrillation: Secondary | ICD-10-CM | POA: Diagnosis not present

## 2016-03-25 DIAGNOSIS — M7989 Other specified soft tissue disorders: Secondary | ICD-10-CM

## 2016-03-25 DIAGNOSIS — I2510 Atherosclerotic heart disease of native coronary artery without angina pectoris: Secondary | ICD-10-CM | POA: Diagnosis not present

## 2016-03-25 DIAGNOSIS — I509 Heart failure, unspecified: Secondary | ICD-10-CM

## 2016-03-25 DIAGNOSIS — R531 Weakness: Secondary | ICD-10-CM

## 2016-03-25 DIAGNOSIS — I714 Abdominal aortic aneurysm, without rupture, unspecified: Secondary | ICD-10-CM

## 2016-03-25 DIAGNOSIS — J441 Chronic obstructive pulmonary disease with (acute) exacerbation: Secondary | ICD-10-CM

## 2016-03-25 DIAGNOSIS — Z66 Do not resuscitate: Secondary | ICD-10-CM

## 2016-03-25 MED ORDER — RANITIDINE HCL 300 MG PO CAPS: 300.0000 mg | ORAL_CAPSULE | Freq: Every evening | ORAL | 1 refills | Status: DC

## 2016-03-25 MED ORDER — ROPINIROLE HCL 0.25 MG PO TABS: 0.2500 mg | ORAL_TABLET | Freq: Every day | ORAL | 1 refills | Status: DC

## 2016-03-25 MED ORDER — AMIODARONE HCL 200 MG PO TABS: 200.0000 mg | ORAL_TABLET | Freq: Every day | ORAL | 1 refills | Status: DC

## 2016-03-25 MED ORDER — LEVOTHYROXINE SODIUM 125 MCG PO TABS: 125.0000 ug | ORAL_TABLET | Freq: Every day | ORAL | 1 refills | Status: DC

## 2016-03-25 MED ORDER — CLOPIDOGREL BISULFATE 75 MG PO TABS: ORAL_TABLET | ORAL | 1 refills | Status: DC

## 2016-03-25 NOTE — Patient Instructions (Signed)
Chronic Obstructive Pulmonary Disease Chronic obstructive pulmonary disease (COPD) is a common lung problem. In COPD, the flow of air from the lungs is limited. The way your lungs work will probably never return to normal, but there are things you can do to improve your lungs and make yourself feel better. Your doctor may treat your condition with:  Medicines.  Oxygen.  Lung surgery.  Changes to your diet.  Rehabilitation. This may involve a team of specialists. Follow these instructions at home:  Take all medicines as told by your doctor.  Avoid medicines or cough syrups that dry up your airway (such as antihistamines) and do not allow you to get rid of thick spit. You do not need to avoid them if told differently by your doctor.  If you smoke, stop. Smoking makes the problem worse.  Avoid being around things that make your breathing worse (like smoke, chemicals, and fumes).  Use oxygen therapy and therapy to help improve your lungs (pulmonary rehabilitation) if told by your doctor. If you need home oxygen therapy, ask your doctor if you should buy a tool to measure your oxygen level (oximeter).  Avoid people who have a sickness you can catch (contagious).  Avoid going outside when it is very hot, cold, or humid.  Eat healthy foods. Eat smaller meals more often. Rest before meals.  Stay active, but remember to also rest.  Make sure to get all the shots (vaccines) your doctor recommends. Ask your doctor if you need a pneumonia shot.  Learn and use tips on how to relax.  Learn and use tips on how to control your breathing as told by your doctor. Try: 1. Breathing in (inhaling) through your nose for 1 second. Then, pucker your lips and breath out (exhale) through your lips for 2 seconds. 2. Putting one hand on your belly (abdomen). Breathe in slowly through your nose for 1 second. Your hand on your belly should move out. Pucker your lips and breathe out slowly through your lips.  Your hand on your belly should move in as you breathe out.  Learn and use controlled coughing to clear thick spit from your lungs. The steps are: 1. Lean your head a little forward. 2. Breathe in deeply. 3. Try to hold your breath for 3 seconds. 4. Keep your mouth slightly open while coughing 2 times. 5. Spit any thick spit out into a tissue. 6. Rest and do the steps again 1 or 2 times as needed. Contact a doctor if:  You cough up more thick spit than usual.  There is a change in the color or thickness of the spit.  It is harder to breathe than usual.  Your breathing is faster than usual. Get help right away if:  You have shortness of breath while resting.  You have shortness of breath that stops you from:  Being able to talk.  Doing normal activities.  You chest hurts for longer than 5 minutes.  Your skin color is more blue than usual.  Your pulse oximeter shows that you have low oxygen for longer than 5 minutes. This information is not intended to replace advice given to you by your health care provider. Make sure you discuss any questions you have with your health care provider. Document Released: 08/18/2007 Document Revised: 08/07/2015 Document Reviewed: 10/26/2012 Elsevier Interactive Patient Education  2017 [place]. Abdominal Aortic Aneurysm Blood pumps away from the heart through tubes (blood vessels) called arteries. Aneurysms are weak or damaged places in  the wall of an artery. It bulges out like a balloon. An abdominal aortic aneurysm happens in the main artery of the body (aorta). It can burst or tear, causing bleeding inside the body. This is an emergency. It needs treatment right away. What are the causes? The exact cause is unknown. Things that could cause this problem include:  Fat and other substances building up in the lining of a tube.  Swelling of the walls of a blood vessel.  Certain tissue diseases.  Belly (abdominal) trauma.  An infection  in the main artery of the body. What increases the risk? There are things that make it more likely for you to have an aneurysm. These include:  Being over the age of 81 years old.  Having high blood pressure (hypertension).  Being a female.  Being white.  Being very overweight (obese).  Having a family history of aneurysm.  Using tobacco products. What are the signs or symptoms? Symptoms depend on the size of the aneurysm and how fast it grows. There may not be symptoms. If symptoms occur, they can include:  Pain (belly, side, lower back, or groin).  Feeling full after eating a small amount of food.  Feeling sick to your stomach (nauseous), throwing up (vomiting), or both.  Feeling a lump in your belly that feels like it is beating (pulsating).  Feeling like you will pass out (faint). How is this treated?  Medicine to control blood pressure and pain.  Imaging tests to see if the aneurysm gets bigger.  Surgery. How is this prevented? To lessen your chance of getting this condition:  Stop smoking. Stop chewing tobacco.  Limit or avoid alcohol.  Keep your blood pressure, blood sugar, and cholesterol within normal limits.  Eat less salt.  Eat foods low in saturated fats and cholesterol. These are found in animal and whole dairy products.  Eat more fiber. Fiber is found in whole grains, vegetables, and fruits.  Keep a healthy weight.  Stay active and exercise often. This information is not intended to replace advice given to you by your health care provider. Make sure you discuss any questions you have with your health care provider. Document Released: 06/26/2012 Document Revised: 08/07/2015 Document Reviewed: 03/31/2012 Elsevier Interactive Patient Education  2017 [place]. Acute Coronary Syndrome Acute coronary syndrome (ACS) is a serious problem in which there is suddenly not enough blood and oxygen supplied to the heart. ACS may mean that one or more of the  blood vessels in your heart (coronary arteries) may be blocked. ACS can result in chest pain or a heart attack (myocardial infarction or MI). What are the causes? This condition is caused by atherosclerosis, which is the buildup of fat and cholesterol (plaque) on the inside of the arteries. Over time, the plaque may narrow or block the artery, and this will lessen blood flow to the heart. Plaque can also become weak and break off within a coronary artery to form a clot and cause a sudden blockage. What increases the risk? The risk factors of this condition include:  High cholesterol levels.  High blood pressure (hypertension).  Smoking.  Diabetes.  Age.  Family history of chest pain, heart disease, or stroke.  Lack of exercise. What are the signs or symptoms? The most common signs of this condition include:  Chest pain, which can be:  A crushing or squeezing in the chest.  A tightness, pressure, fullness, or heaviness in the chest.  Present for more than a few  minutes, or it can stop and recur.  Pain in the arms, neck, jaw, or back.  Unexplained heartburn or indigestion.  Shortness of breath.  Nausea.  Sudden cold sweats.  Feeling light-headed or dizzy. Sometimes, this condition has no symptoms. How is this diagnosed? ACS may be diagnosed through the following tests:  Electrocardiogram (ECG).  Blood tests.  Coronary angiogram. This is a procedure to look at the coronary arteries to see if there is any blockage. How is this treated? Treatment for ACS may include:  Healthy behavioral changes to reduce or control risk factors.  Medicine.  Coronary stenting.A stent helps to keep an artery open.  Coronary angioplasty. This procedure widens a narrowed or blocked artery.  Coronary artery bypass surgery. This will allow your blood to pass the blockage (bypass) to reach your heart. Follow these instructions at home: Eating and drinking  Follow a heart-healthy  diet. A dietitian can you help to educate you about healthy food options and changes.  Use healthy cooking methods such as roasting, grilling, broiling, baking, poaching, steaming, or stir-frying. Talk to a dietitian to learn more about healthy cooking methods. Medicines  Take medicines only as directed by your health care provider.  Do not take the following medicines unless your health care provider approves:  Nonsteroidal anti-inflammatory drugs (NSAIDs), such as ibuprofen, naproxen, or celecoxib.  Vitamin supplements that contain vitamin A, vitamin E, or both.  Hormone replacement therapy that contains estrogen with or without progestin.  Stop illegal drug use. Activity  Follow an exercise program that is approved by your health care provider.  Plan rest periods when you are fatigued. Lifestyle  Do not use any tobacco products, including cigarettes, chewing tobacco, or electronic cigarettes. If you need help quitting, ask your health care provider.  If you drink alcohol, and your health care provider approves, limit your alcohol intake to no more than 1 drink per day. One drink equals 12 ounces of beer, 5 ounces of wine, or 1 ounces of hard liquor.  Learn to manage stress.  Maintain a healthy weight. Lose weight as approved by your health care provider. General instructions  Manage other health conditions, such as hypertension and diabetes, as directed by your health care provider.  Keep all follow-up visits as directed by your health care provider. This is important.  Your health care provider may ask you to monitor your blood pressure. A blood pressure reading consists of a higher number over a lower number, such as 110 over 72, written as 110/72. Ideally, your blood pressure should be:  Below 140/90 if you have no other medical conditions.  Below 130/80 if you have diabetes or kidney disease. Get help right away if:  You have pain in your chest, neck, arm, jaw,  stomach, or back that lasts more than a few minutes, is recurring, or is not relieved by taking medicine under your tongue (sublingual nitroglycerin).  You have profuse sweating without cause.  You have unexplained:  Heartburn or indigestion.  Shortness of breath or difficulty breathing.  Nausea or vomiting.  Fatigue.  Feelings of nervousness or anxiety.  Weakness.  Diarrhea.  You have sudden light-headedness or dizziness.  You faint. These symptoms may represent a serious problem that is an emergency. Do not wait to see if the symptoms will go away. Get medical help right away. Call your local emergency services (911 in the [place] Do not drive yourself to the clinic or hospital.  This information is not intended to replace advice  given to you by your health care provider. Make sure you discuss any questions you have with your health care provider. Document Released: 03/01/2005 Document Revised: 08/13/2015 Document Reviewed: 07/03/2013 Elsevier Interactive Patient Education  2017 [place]. Atrial Fibrillation Introduction Atrial fibrillation is a type of heartbeat that is irregular or fast (rapid). If you have this condition, your heart keeps quivering in a weird (chaotic) way. This condition can make it so your heart cannot pump blood normally. Having this condition gives a person more risk for stroke, heart failure, and other heart problems. There are different types of atrial fibrillation. Talk with your doctor to learn about the type that you have. Follow these instructions at home:  Take over-the-counter and prescription medicines only as told by your doctor.  If your doctor prescribed a blood-thinning medicine, take it exactly as told. Taking too much of it can cause bleeding. If you do not take enough of it, you will not have the protection that you need against stroke and other problems.  Do not use any tobacco products. These include cigarettes, chewing tobacco, and  e-cigarettes. If you need help quitting, ask your doctor.  If you have apnea (obstructive sleep apnea), manage it as told by your doctor.  Do not drink alcohol.  Do not drink beverages that have caffeine. These include coffee, soda, and tea.  Maintain a healthy weight. Do not use diet pills unless your doctor says they are safe for you. Diet pills may make heart problems worse.  Follow diet instructions as told by your doctor.  Exercise regularly as told by your doctor.  Keep all follow-up visits as told by your doctor. This is important. Contact a doctor if:  You notice a change in the speed, rhythm, or strength of your heartbeat.  You are taking a blood-thinning medicine and you notice more bruising.  You get tired more easily when you move or exercise. Get help right away if:  You have pain in your chest or your belly (abdomen).  You have sweating or weakness.  You feel sick to your stomach (nauseous).  You notice blood in your throw up (vomit), poop (stool), or pee (urine).  You are short of breath.  You suddenly have swollen feet and ankles.  You feel dizzy.  Your suddenly get weak or numb in your face, arms, or legs, especially if it happens on one side of your body.  You have trouble talking, trouble understanding, or both.  Your face or your eyelid droops on one side. These symptoms may be an emergency. Do not wait to see if the symptoms will go away. Get medical help right away. Call your local emergency services (911 in the [place] Do not drive yourself to the hospital.  This information is not intended to replace advice given to you by your health care provider. Make sure you discuss any questions you have with your health care provider. Document Released: 12/09/2007 Document Revised: 08/07/2015 Document Reviewed: 06/26/2014  2017 [employer]  Rx's sent to pharmacy, take as directed. Continue with Cardiologist as directed, next visit 03/31/16.   Return for  comprehensive follow up Feb 2018. Please call clinic with any questions/concerns answered.

## 2016-03-25 NOTE — Progress Notes (Signed)
   Subjective:    Patient ID: [name], female    DOB: Oct 14, 1917, 83 y.o.   MRN: [id number]  HPI:  [name] presents to establish as a new patient.  She has an extensixe medical hx and is closely followed by Cardiology, next appt. 03/31/16.  She was recently discharged from [facility] and several of her medications have run out - she requires refill until her new insurance begins 04/2016.  She is compliant on all medications and denies SE.  She lives at home with her daughter/son-in-law/several grandchildren...excellent support system.  She is able to perform most of her ADLs, with some assistance from her daughter.  She ambulates with help from walker.  Daughter at [facility], [name] gave verbal permission for her to be present and involved in discussion of plan of care.  Review of Systems  Constitutional: Positive for activity change. Negative for appetite change, chills, diaphoresis, fatigue, fever and unexpected weight change.  HENT: Positive for hearing loss. Negative for congestion, dental problem, drooling, mouth sores, sore throat and trouble swallowing.   Eyes: Positive for redness.  Respiratory: Negative for chest tightness, shortness of breath and wheezing.   Cardiovascular: Negative for chest pain, palpitations and leg swelling.  Gastrointestinal: Negative for abdominal distention, abdominal pain, diarrhea and nausea.  Endocrine: Positive for cold intolerance. Negative for heat intolerance, polydipsia and polyphagia.  Genitourinary: Negative for difficulty urinating.  Musculoskeletal: Positive for arthralgias and myalgias.  Neurological: Positive for weakness. Negative for dizziness, light-headedness and headaches.  Psychiatric/Behavioral: Negative for behavioral problems, confusion, decreased concentration and sleep disturbance.       Objective:   Physical Exam  Constitutional: She is oriented to person, place, and time. She appears well-nourished. No distress.   HENT:  Head: Normocephalic and atraumatic.  Right Ear: External ear normal.  Left Ear: External ear normal.  Eyes: Pupils are equal, round, and reactive to light.  Neck: Neck supple.  Cardiovascular: Normal rate and regular rhythm.   Murmur heard. Pulmonary/Chest: Breath sounds normal. She is in respiratory distress. She has no wheezes. She has no rales. She exhibits no tenderness.  Abdominal: Soft. Bowel sounds are normal.  Musculoskeletal:       Right hip: She exhibits decreased range of motion and decreased strength.       Left hip: She exhibits decreased range of motion and decreased strength.  Neurological: She is alert and oriented to person, place, and time. Gait abnormal.  Skin: She is not diaphoretic.  Psychiatric: She has a normal mood and affect. Her speech is normal and behavior is normal. Judgment and thought content normal.          Assessment & Plan:  1.  CAD, 2. A Fib, 3.  AAA, 4. PAD, 5. CHF, 6. SSS, 7. HF, 8. COPD, 18. LLE Pain-continue medications and regular f/u with Cardiologist. 10.  Generalized weakness-eat, regular ell-balanced meals.  Continue with home strengthening program and use of walker. Rx's sent to pharmacy, take as directed.   Will obtain full labs and perform comprehensive follow-up 04/2016. All questions/concerns answered.  Katy d. Arvil Persons, NP-C

## 2016-03-26 ENCOUNTER — Other Ambulatory Visit: Payer: Self-pay | Admitting: Cardiovascular Disease

## 2016-03-26 DIAGNOSIS — I480 Paroxysmal atrial fibrillation: Secondary | ICD-10-CM | POA: Diagnosis not present

## 2016-03-26 DIAGNOSIS — I10 Essential (primary) hypertension: Secondary | ICD-10-CM | POA: Diagnosis not present

## 2016-03-26 DIAGNOSIS — I2510 Atherosclerotic heart disease of native coronary artery without angina pectoris: Secondary | ICD-10-CM | POA: Diagnosis not present

## 2016-03-26 DIAGNOSIS — Z7901 Long term (current) use of anticoagulants: Secondary | ICD-10-CM | POA: Diagnosis not present

## 2016-03-26 DIAGNOSIS — Z8781 Personal history of (healed) traumatic fracture: Secondary | ICD-10-CM | POA: Diagnosis not present

## 2016-03-26 DIAGNOSIS — M810 Age-related osteoporosis without current pathological fracture: Secondary | ICD-10-CM | POA: Diagnosis not present

## 2016-03-26 DIAGNOSIS — I739 Peripheral vascular disease, unspecified: Secondary | ICD-10-CM

## 2016-03-26 DIAGNOSIS — Z950 Presence of cardiac pacemaker: Secondary | ICD-10-CM | POA: Diagnosis not present

## 2016-03-26 DIAGNOSIS — Z9181 History of falling: Secondary | ICD-10-CM | POA: Diagnosis not present

## 2016-03-29 ENCOUNTER — Telehealth: Payer: Self-pay | Admitting: Cardiology

## 2016-03-29 ENCOUNTER — Ambulatory Visit (INDEPENDENT_AMBULATORY_CARE_PROVIDER_SITE_OTHER): Payer: Medicare HMO | Admitting: *Deleted

## 2016-03-29 DIAGNOSIS — Z950 Presence of cardiac pacemaker: Secondary | ICD-10-CM

## 2016-03-29 NOTE — Telephone Encounter (Signed)
LMOVM reminding pt to send remote transmission.   

## 2016-03-31 ENCOUNTER — Inpatient Hospital Stay (HOSPITAL_COMMUNITY): Admission: RE | Admit: 2016-03-31 | Payer: Medicare Other | Source: Ambulatory Visit

## 2016-04-02 LAB — CUP PACEART REMOTE DEVICE CHECK
Battery Impedance: 6167 Ohm
Battery Voltage: 2.64 V
Brady Statistic AP VS Percent: 81 %
Brady Statistic AS VP Percent: 4 %
Date Time Interrogation Session: 20180115210938
Implantable Lead Implant Date: 20090723
Implantable Lead Location: 753860
Implantable Lead Model: 5076
Lead Channel Impedance Value: 488 Ohm
Lead Channel Impedance Value: 549 Ohm
Lead Channel Pacing Threshold Pulse Width: 0.4 ms
Lead Channel Setting Pacing Amplitude: 1.5 V
Lead Channel Setting Pacing Amplitude: 2 V
MDC IDC LEAD IMPLANT DT: 20090723
MDC IDC LEAD LOCATION: 753859
MDC IDC MSMT BATTERY REMAINING LONGEVITY: 3 mo
MDC IDC MSMT LEADCHNL RA PACING THRESHOLD AMPLITUDE: 0.75 V
MDC IDC MSMT LEADCHNL RV PACING THRESHOLD AMPLITUDE: 1 V
MDC IDC MSMT LEADCHNL RV PACING THRESHOLD PULSEWIDTH: 0.4 ms
MDC IDC MSMT LEADCHNL RV SENSING INTR AMPL: 11.2 mV
MDC IDC PG IMPLANT DT: 20090723
MDC IDC SET LEADCHNL RV PACING PULSEWIDTH: 0.46 ms
MDC IDC SET LEADCHNL RV SENSING SENSITIVITY: 5.6 mV
MDC IDC STAT BRADY AP VP PERCENT: 2 %
MDC IDC STAT BRADY AS VS PERCENT: 13 %

## 2016-04-02 NOTE — Progress Notes (Signed)
Remote pacemaker transmission.   

## 2016-04-07 ENCOUNTER — Encounter: Payer: Self-pay | Admitting: Cardiology

## 2016-04-11 IMAGING — CR DG KNEE COMPLETE 4+V*R*
4 series · 4 of 4 positions shown · non-contrast
Comparison: None.

CLINICAL DATA: Right knee pain after fall.  Initial encounter.

EXAM:
RIGHT KNEE - COMPLETE 4+ VIEW

[AP]
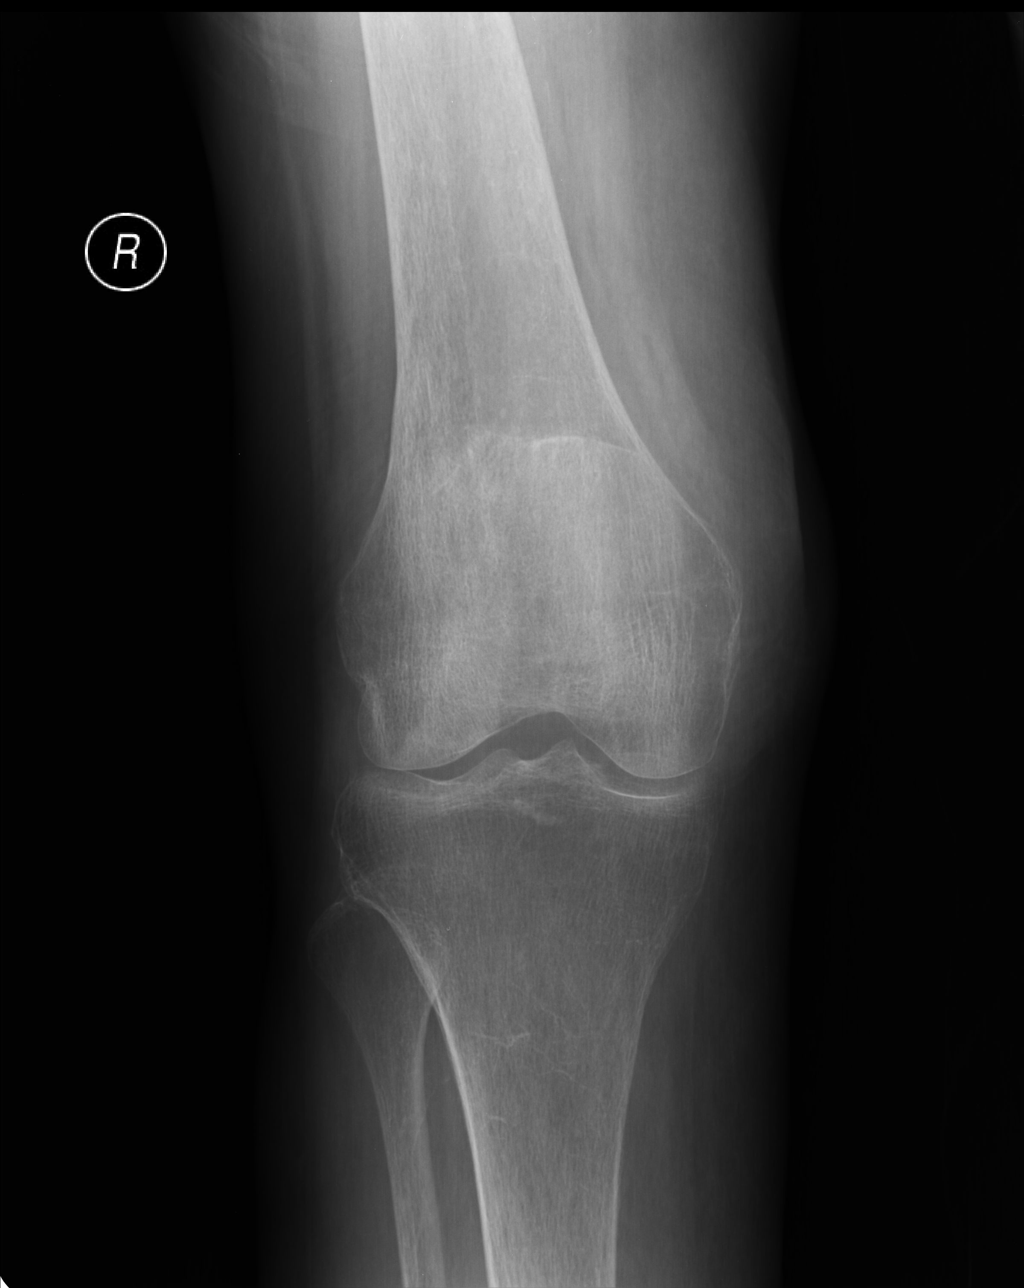

[ap axial]
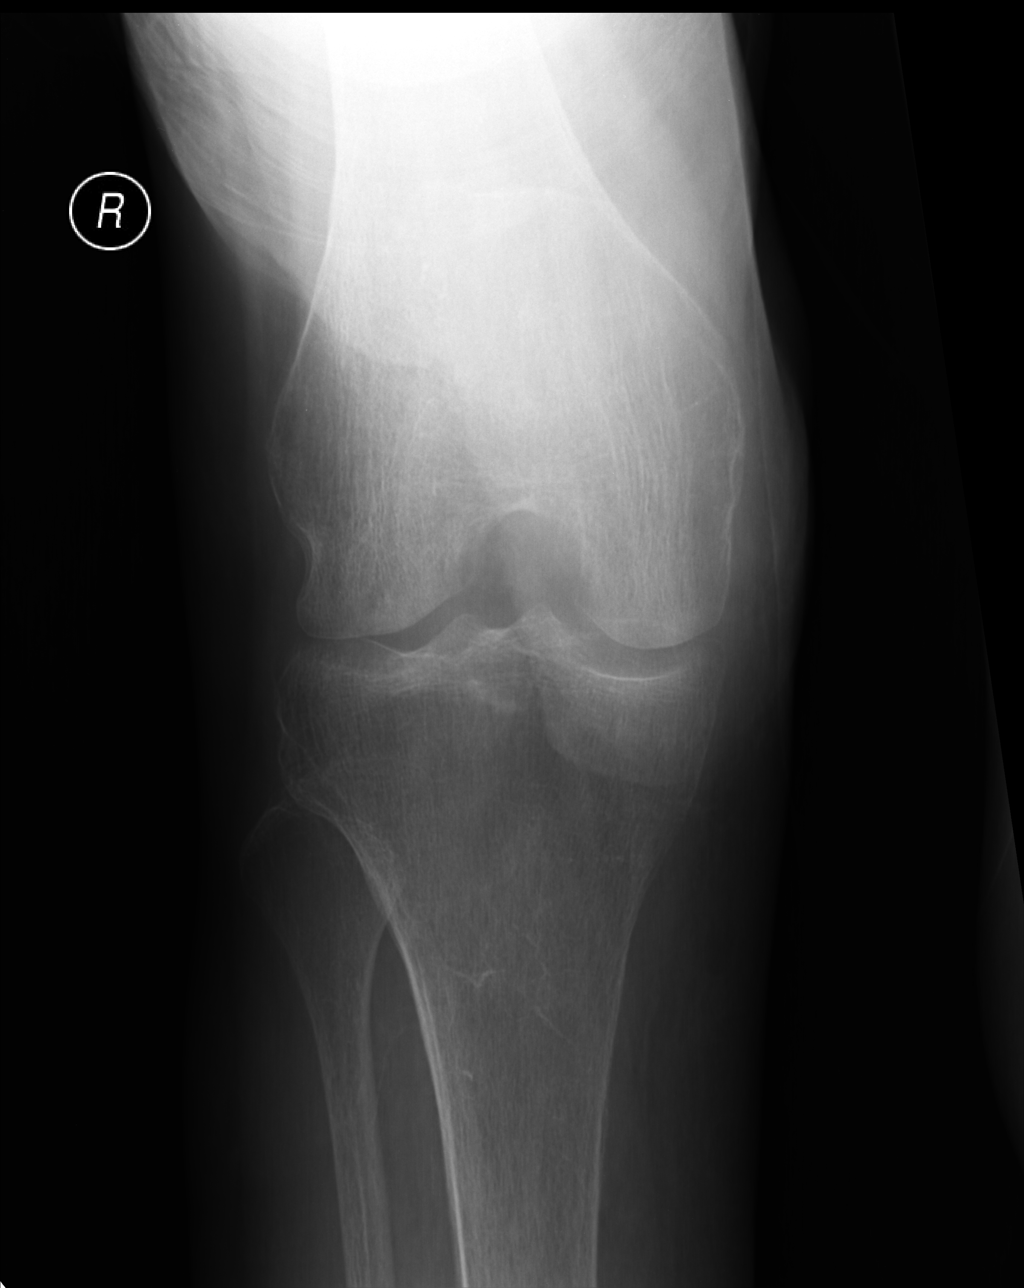

[lateral]
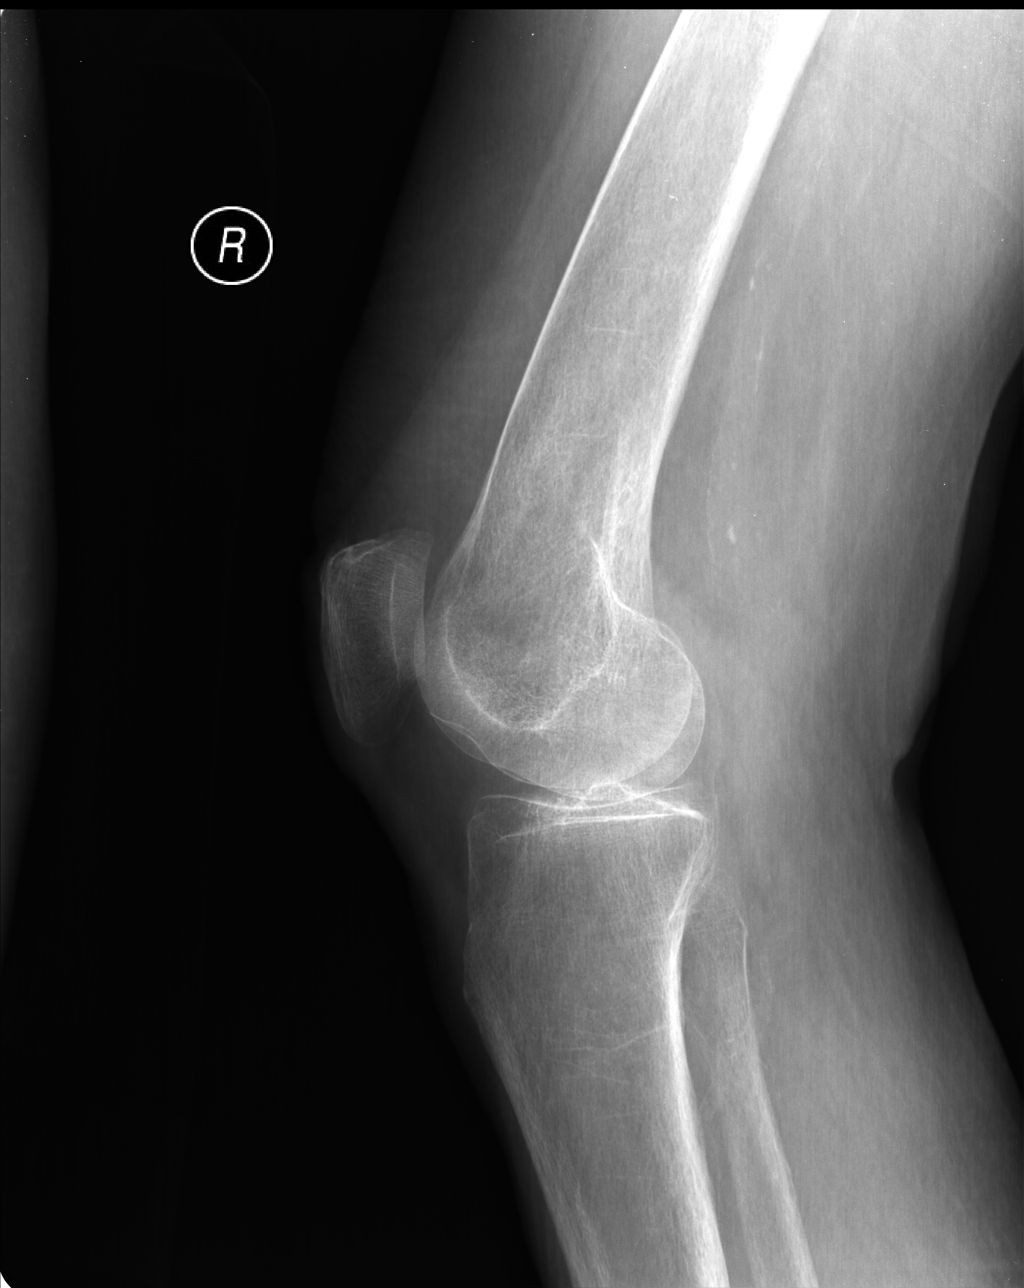

[sunrise]
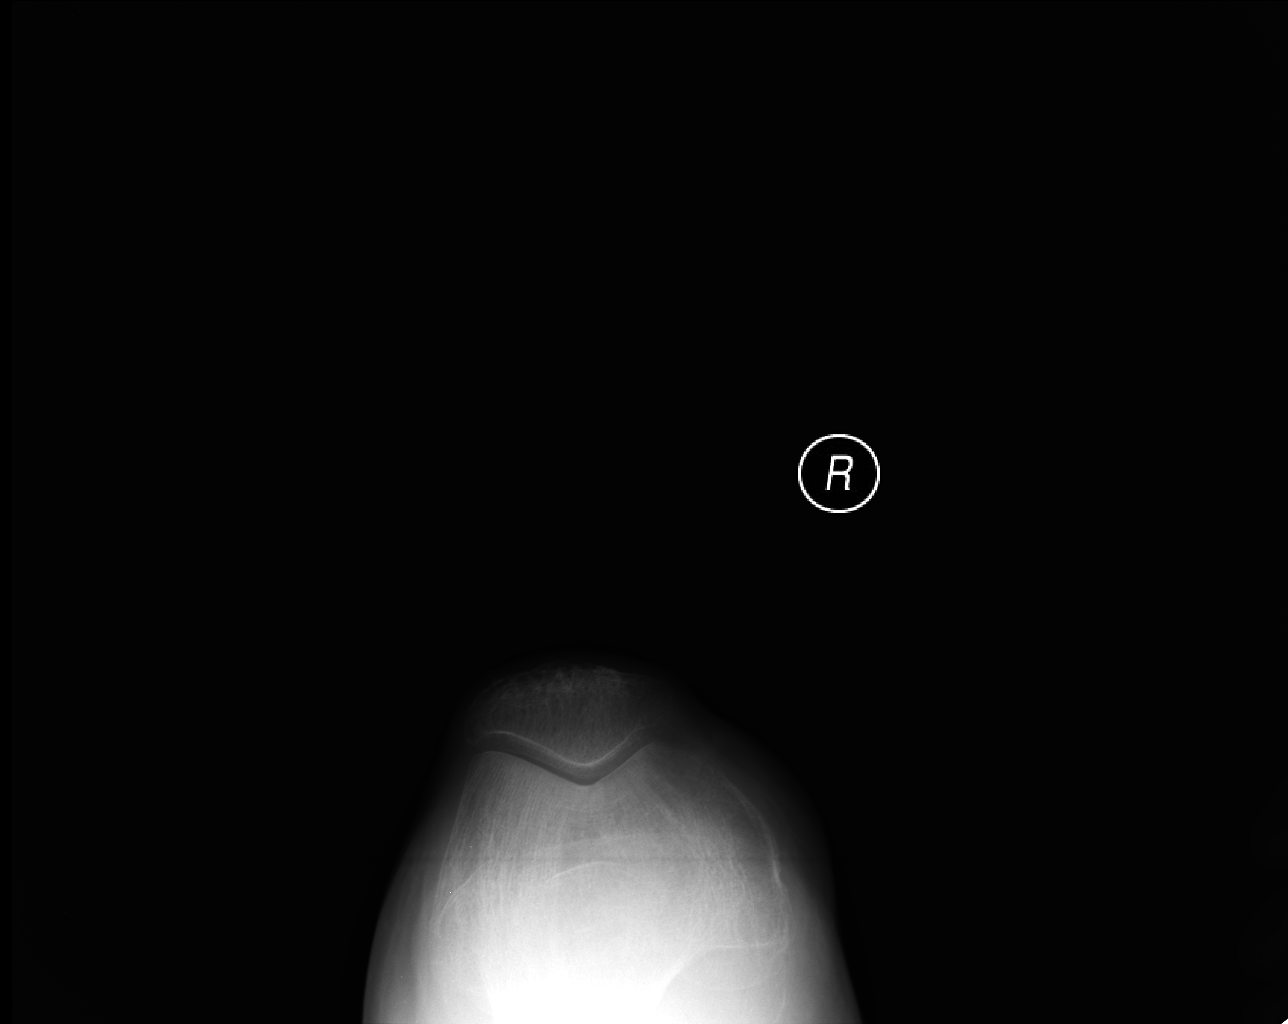

[4 of 4 positions shown; findings below may reference images not displayed]

FINDINGS: There is no evidence of fracture, dislocation, or joint effusion.
Mild narrowing of the medial and lateral joint spaces is noted. Mild
diffuse osteopenia is noted. Soft tissues are unremarkable.
IMPRESSION: Mild degenerative joint disease is noted. No acute abnormality seen
in the right knee.

## 2016-04-26 ENCOUNTER — Ambulatory Visit (INDEPENDENT_AMBULATORY_CARE_PROVIDER_SITE_OTHER): Payer: Medicare HMO | Admitting: Adult Health

## 2016-04-26 ENCOUNTER — Encounter: Payer: Self-pay | Admitting: Adult Health

## 2016-04-26 VITALS — BP 132/80 | HR 92 | Ht 62.0 in | Wt 108.3 lb

## 2016-04-26 DIAGNOSIS — E784 Other hyperlipidemia: Secondary | ICD-10-CM

## 2016-04-26 DIAGNOSIS — I5031 Acute diastolic (congestive) heart failure: Secondary | ICD-10-CM

## 2016-04-26 DIAGNOSIS — E43 Unspecified severe protein-calorie malnutrition: Secondary | ICD-10-CM

## 2016-04-26 DIAGNOSIS — I950 Idiopathic hypotension: Secondary | ICD-10-CM

## 2016-04-26 DIAGNOSIS — D696 Thrombocytopenia, unspecified: Secondary | ICD-10-CM | POA: Diagnosis not present

## 2016-04-26 DIAGNOSIS — E7849 Other hyperlipidemia: Secondary | ICD-10-CM

## 2016-04-26 DIAGNOSIS — R531 Weakness: Secondary | ICD-10-CM | POA: Diagnosis not present

## 2016-04-26 DIAGNOSIS — E038 Other specified hypothyroidism: Secondary | ICD-10-CM | POA: Diagnosis not present

## 2016-04-26 DIAGNOSIS — L89511 Pressure ulcer of right ankle, stage 1: Secondary | ICD-10-CM | POA: Diagnosis not present

## 2016-04-26 DIAGNOSIS — I110 Hypertensive heart disease with heart failure: Secondary | ICD-10-CM

## 2016-04-26 MED ORDER — LEVOTHYROXINE SODIUM 125 MCG PO TABS: 125.0000 ug | ORAL_TABLET | Freq: Every day | ORAL | 1 refills | Status: DC

## 2016-04-26 MED ORDER — CARBOXYMETHYLCELLULOSE SODIUM 1 % OP SOLN: 1.0000 [drp] | Freq: Three times a day (TID) | OPHTHALMIC | 2 refills | Status: AC

## 2016-04-26 MED ORDER — DOCOSANOL 10 % EX CREA: 1.0000 "application " | TOPICAL_CREAM | Freq: Every day | CUTANEOUS | 2 refills | Status: DC

## 2016-04-26 MED ORDER — RANITIDINE HCL 300 MG PO CAPS: 300.0000 mg | ORAL_CAPSULE | Freq: Every evening | ORAL | 1 refills | Status: AC

## 2016-04-26 MED ORDER — VITAMIN B-12 1000 MCG PO TABS: 1000.0000 ug | ORAL_TABLET | Freq: Every day | ORAL | 2 refills | Status: DC

## 2016-04-26 MED ORDER — KLOR-CON M10 10 MEQ PO TBCR: 10.0000 meq | EXTENDED_RELEASE_TABLET | Freq: Two times a day (BID) | ORAL | 0 refills | Status: DC

## 2016-04-26 MED ORDER — ACETAMINOPHEN 500 MG PO TABS: 500.0000 mg | ORAL_TABLET | Freq: Every day | ORAL | 2 refills | Status: AC

## 2016-04-26 MED ORDER — CALCIUM CARBONATE-VITAMIN D 600-400 MG-UNIT PO TABS: 1.0000 | ORAL_TABLET | Freq: Every day | ORAL | 2 refills | Status: AC

## 2016-04-26 MED ORDER — ROPINIROLE HCL 0.25 MG PO TABS: 0.2500 mg | ORAL_TABLET | Freq: Every day | ORAL | 1 refills | Status: DC

## 2016-04-26 MED ORDER — BIOTIN 1000 MCG PO TABS: 1000.0000 ug | ORAL_TABLET | Freq: Every day | ORAL | 2 refills | Status: AC

## 2016-04-26 NOTE — Assessment & Plan Note (Signed)
Stay hydrated.  When changing positions, please take a minute before standing.

## 2016-04-26 NOTE — Assessment & Plan Note (Signed)
Stay hydrated and eat a well balanced diet.

## 2016-04-26 NOTE — Assessment & Plan Note (Signed)
1.5cm circular pressure ulcer on right lateral ankle. Keep it clean, dry, and do not put pressure on ankle for prolonged periods (i.e. Sleeping). Elevate lower extremities several times daily.   Podiatry referral placed.

## 2016-04-26 NOTE — Progress Notes (Signed)
Subjective:    Patient ID: [name], female    DOB: [birth year], 83 y.o.   MRN: [id number]  HPI:  [name] presents for regular f/u.  She is a very pleasant 83 year with extensive medical hx.  She is compliant on all medications and has a Cardiology appt for this Thursday 04/29/2016.  She experienced minor SOB last Saturday in early am, she took 1 Nitroglycerin tablet and sx's completely resolved.  She has been working on puzzles that have kept her seated for hours, which has exacerbated lower ext. Edema.   Daughter at [facility] during encounter.   Patient Care Team    Relationship Specialty Notifications Start End  Odella Aquas, NP PCP - General Family Medicine  03/25/16   Sanda Klein, MD Consulting Physician Cardiology  03/25/16     Patient Active Problem List   Diagnosis Date Noted  . COPD (chronic obstructive pulmonary disease) ([place]) 11/29/2015  . Abdominal aortic aneurysm (AAA) without rupture ([place]) 08/21/2015  . DNR (do not resuscitate)   . Weakness generalized   . GERD (gastroesophageal reflux disease) 07/04/2015  . Dysphagia 07/04/2015  . Transaminitis 06/24/2015  . HCAP (healthcare-associated pneumonia) 06/22/2015  . Pneumonia 06/22/2015  . COPD exacerbation ([place]) 06/21/2015  . Cough 06/09/2015  . Fever, unspecified 06/09/2015  . Chronic diastolic congestive heart failure ([place]) 04/24/2015  . Restless legs 04/24/2015  . Restless leg 04/24/2015  . Pain and swelling of left lower extremity 02/15/2015  . Candidiasis of perineum 02/15/2015  . Hemorrhoids 02/15/2015  . CKD (chronic kidney disease) stage 3, GFR 30-59 ml/min 02/06/2015  . Acute respiratory failure with hypoxia ([place]) 01/29/2015  . Acute pulmonary edema ([place]) 01/29/2015  . SOB (shortness of breath)   . Palliative care encounter   . Sepsis ([place]) 01/26/2015  . Cellulitis of leg, left 01/26/2015  . Protein-calorie malnutrition, severe ([place]) 11/06/2014  . Pressure ulcer 11/06/2014  . Acute diastolic CHF  (congestive heart failure) ([place]) 11/04/2014  . Weakness 11/04/2014  . Hypertensive heart disease with CHF (congestive heart failure) ([place]) 11/04/2014  . Macrocytic anemia 11/04/2014  . Cardiomyopathy, ischemic 11/04/2014  . CHF exacerbation ([place]) 11/03/2014  . Body mass index (BMI) of 20.0-20.9 in adult 06/23/2014  . Hard of hearing 06/23/2014  . Rectal bleeding 07/16/2013  . Hyperlipidemia, statin intolerance 04/30/2013  . Paroxysmal atrial fibrillation ([place]) 04/30/2013  . Pacemaker - dual chamber Medtronic 2009 04/30/2013  . AAA (abdominal aortic aneurysm) ([place]) 04/30/2013  . Bilateral carotid artery stenosis 04/30/2013  . PAD (peripheral artery disease) ([place]) 04/30/2013  . Constipation 08/31/2011  . Hypotension 08/28/2011  . S/P total hip arthroplasty 08/28/2011  . CAD (coronary artery disease) 08/28/2011  . Hypothyroid 08/28/2011  . Klebsiella cystitis 08/28/2011  . Thrombocytopenia ([place]) 08/28/2011  . SSS (sick sinus syndrome) ([place]) 10/05/2007     Past Medical History:  Diagnosis Date  . Abdominal aortic aneurysm ([place])   . Anemia    hx  . Asthma    hx  . Atrial fibrillation ([place])   . Blood transfusion   . Blood transfusion without reported diagnosis   . CAD (coronary artery disease)   . COPD (chronic obstructive pulmonary disease) ([place])   . GERD (gastroesophageal reflux disease)   . GI bleed 2007  . H/O hiatal hernia   . Heart attack   . Heart disease   . History of right hip replacement 2013  . Hyperlipidemia   . Hypertension   . Hypothyroidism   . Myocardial infarction   .  Osteoporosis   . Pacemaker   . Renal failure, chronic   . SSS (sick sinus syndrome) ([place]) 10/05/2007   Medtronic Adapta  . Stroke ([place])   . TIA (transient ischemic attack)   . UTI (lower urinary tract infection)    hx     Past Surgical History:  Procedure Laterality Date  . CARDIAC CATHETERIZATION    . COLONOSCOPY    . CORONARY STENT PLACEMENT     x9  . FRACTURE SURGERY      femur fx, /w ORIF into HIP- 2008  . JOINT REPLACEMENT    . NM MYOCAR PERF WALL MOTION  10/08/2010   mild apical anterior ischemia  . PACEMAKER INSERTION  10/05/2007   Medtronic Adapta  . TOTAL HIP ARTHROPLASTY     planned for 08/27/2011 Left  . TOTAL HIP ARTHROPLASTY  08/27/2011   Procedure: TOTAL HIP ARTHROPLASTY;  Surgeon: Kerin Salen, MD;  Location: [place];  Service: Orthopedics;  Laterality: Right;  . US ECHOCARDIOGRAPHY  08/23/2011   EF 50%,LA mod. dilated,mild to mod. mitral annular ca+,mod. MR,mild to mod TR,mild to mod. PH,trace AI.     Family History  Problem Relation Age of Onset  . Emphysema Father   . Heart disease Mother   . Cancer Brother   . Cancer Brother   . Cancer Sister   . Cancer Sister   . Diabetes Daughter   . Atrial fibrillation Daughter   . Atrial fibrillation Daughter   . Stroke Daughter   . Heart Problems Son      History  Drug Use No     History  Alcohol Use No     History  Smoking Status  . Former Smoker  . Types: Cigarettes  Smokeless Tobacco  . Never Used    Comment: quit 40 yrs ago- was smoking 2 packs a day at the most     Outpatient Encounter Prescriptions as of 04/26/2016  Medication Sig Note  . acetaminophen (TYLENOL) 500 MG tablet Take 1 tablet (500 mg total) by mouth at bedtime.   [name] amiodarone (PACERONE) 200 MG tablet Take 1 tablet (200 mg total) by mouth daily.   . Biotin 1000 MCG tablet Take 1 tablet (1 mg total) by mouth daily.   . Calcium Carbonate-Vitamin D (CALCIUM 600+D) 600-400 MG-UNIT tablet Take 1 tablet by mouth daily.   . carboxymethylcellulose 1 % ophthalmic solution Apply 1 drop to eye 3 (three) times daily.   . clopidogrel (PLAVIX) 75 MG tablet TAKE 1 TABLET (75 MG TOTAL) BY MOUTH DAILY.   . cycloSPORINE (RESTASIS) 0.05 % ophthalmic emulsion Place 1 drop into both eyes every 12 (twelve) hours.   . Docosanol (ABREVA) 10 % CREA Apply 1 application topically 5 (five) times daily. Apply to affected area on lip  5 x  day until healed   . furosemide (LASIX) 20 MG tablet Take 1 tablet (20 mg total) by mouth daily.   [name] KLOR-CON M10 10 MEQ tablet Take 10 mEq by mouth 2 (two) times daily. 03/25/2016: Received from: External Pharmacy Received Sig: TAKE 1 TABLET BY MOUTH TWICE A DAY  . levothyroxine (SYNTHROID, LEVOTHROID) 125 MCG tablet Take 1 tablet (125 mcg total) by mouth daily before breakfast.   . nitroGLYCERIN (NITROSTAT) 0.4 MG SL tablet Place 1 tablet (0.4 mg total) under the tongue every 5 (five) minutes as needed for chest pain (For a total of 3 tablets, if chest pain persist to call 911).   . NUTRITIONAL SUPPLEMENTS PO Take  237 mLs by mouth daily.   . potassium chloride (K-DUR) 10 MEQ tablet Take 10 mEq by mouth 2 (two) times daily with a meal. 9am, 6pm   . ranitidine (ZANTAC) 300 MG capsule Take 1 capsule (300 mg total) by mouth every evening.   [name] rOPINIRole (REQUIP) 0.25 MG tablet Take 1 tablet (0.25 mg total) by mouth at bedtime.   . vitamin B-12 (CYANOCOBALAMIN) 1000 MCG tablet Take 1 tablet (1,000 mcg total) by mouth daily.   . [DISCONTINUED] acetaminophen (TYLENOL) 500 MG tablet Take 500 mg by mouth at bedtime.   . [DISCONTINUED] Biotin 1000 MCG tablet Take 1,000 mcg by mouth daily.   . [DISCONTINUED] Calcium Carbonate-Vitamin D (CALCIUM 600+D) 600-400 MG-UNIT tablet Take 1 tablet by mouth daily.    . [DISCONTINUED] carboxymethylcellulose 1 % ophthalmic solution Apply 1 drop to eye 3 (three) times daily.   . [DISCONTINUED] Docosanol (ABREVA) 10 % CREA Apply topically. Apply to affected area on lip  5 x day until healed   . [DISCONTINUED] levothyroxine (SYNTHROID, LEVOTHROID) 125 MCG tablet Take 1 tablet (125 mcg total) by mouth daily before breakfast.   . [DISCONTINUED] ranitidine (ZANTAC) 300 MG capsule Take 1 capsule (300 mg total) by mouth every evening.   . [DISCONTINUED] rOPINIRole (REQUIP) 0.25 MG tablet Take 1 tablet (0.25 mg total) by mouth at bedtime.   . [DISCONTINUED] vitamin B-12  (CYANOCOBALAMIN) 1000 MCG tablet Take 1,000 mcg by mouth daily.    No facility-administered encounter medications on file as of 04/26/2016.     Allergies: Nubain [nalbuphine hcl]; Mirtazapine; Pineapple; Statins; and Iodine  Body mass index is 19.81 kg/m.  Blood pressure 132/80, pulse 92, height 5\' 2"  (1.575 m), weight 108 lb 4.8 oz (49.1 kg).     Review of Systems  Constitutional: Positive for fatigue. Negative for activity change, appetite change, chills, diaphoresis, fever and unexpected weight change.  HENT: Negative for congestion.   Eyes: Negative for visual disturbance.  Respiratory: Negative for cough, shortness of breath, wheezing and stridor.   Cardiovascular: Positive for leg swelling. Negative for chest pain and palpitations.       Bil lower ext. edema  Gastrointestinal: Negative for abdominal distention, abdominal pain, blood in stool and nausea.  Endocrine: Negative for cold intolerance, heat intolerance, polydipsia, polyphagia and polyuria.  Genitourinary: Negative for difficulty urinating and flank pain.  Musculoskeletal: Positive for arthralgias, back pain, gait problem, joint swelling, myalgias, neck pain and neck stiffness.  Skin: Positive for wound. Negative for color change, pallor and rash.       Right ankle-1.5 cm circular area of skin breakdown,  Neurological: Negative for dizziness, tremors, syncope, weakness and light-headedness.  Psychiatric/Behavioral: Negative for agitation, behavioral problems, self-injury, sleep disturbance and suicidal ideas.       Objective:   Physical Exam  Constitutional: She is oriented to person, place, and time. No distress.  HENT:  Head: Normocephalic and atraumatic.  Eyes: Conjunctivae and EOM are normal. Pupils are equal, round, and reactive to light.  Neck: Neck supple.  Cardiovascular: Normal rate, normal heart sounds and intact distal pulses.  Exam reveals no gallop and no friction rub.   No murmur heard. Irregular  rhythm.  Pulmonary/Chest: Effort normal. She has no wheezes. She has no rales. She exhibits no tenderness.  Abdominal: Soft. Bowel sounds are normal. She exhibits no distension and no mass. There is no tenderness. There is no rebound and no guarding.  Musculoskeletal: She exhibits edema and tenderness.  Right hip: She exhibits tenderness.       Left hip: She exhibits tenderness.       Right ankle: She exhibits swelling and ecchymosis. She exhibits normal pulse. Tenderness. Lateral malleolus tenderness found. Achilles tendon exhibits no pain and no defect.       Left ankle: She exhibits swelling and ecchymosis. She exhibits normal pulse. Achilles tendon exhibits no pain.       Thoracic back: She exhibits tenderness and deformity.       Lumbar back: She exhibits tenderness.  Lateral right ankle: 1.5cm circular area of skin breakdown.  No drainage, streaking noted.  Bil mid shins-1+ edema, reddened skin. Bil ankles/feet-1+ edema, reddened skin. All toes 2+ edema, reddened.    Lymphadenopathy:    She has no cervical adenopathy.  Neurological: She is alert and oriented to person, place, and time. She has normal reflexes.  Skin: Skin is warm and dry. No rash noted. She is not diaphoretic. There is erythema. No pallor.  Psychiatric: She has a normal mood and affect. Her behavior is normal. Judgment and thought content normal.          Assessment & Plan:   1. Other specified hypothyroidism   2. Idiopathic hypotension   3. Thrombocytopenia ([place])   4. Other hyperlipidemia   5. Acute diastolic CHF (congestive heart failure) ([place])   6. Hypertensive heart disease with CHF (congestive heart failure) ([place])   7. Protein-calorie malnutrition, severe ([place])   8. Weakness generalized   9. Decubitus ulcer of right ankle, stage 1     Hypotension Stay hydrated.  When changing positions, please take a minute before standing.  Acute diastolic CHF (congestive heart failure) Continue  medications as directed. Has Cardiologist appt. 04/29/2016. Last use of Nitroglycerin was 04/24/16 for SOB that was resolved with only 1 dose.  Hypertensive heart disease with CHF (congestive heart failure) ([place]) Continue medications as directed. Has Cardiologist appt. 04/29/2016. Last use of Nitroglycerin was 04/24/16 for SOB that was resolved with only 1 dose.  Hypothyroid Checked TSH level today. Continue Levothyroxine as directed.  Pressure ulcer 1.5cm circular pressure ulcer on right lateral ankle. Keep it clean, dry, and do not put pressure on ankle for prolonged periods (i.e. Sleeping). Elevate lower extremities several times daily.   Podiatry referral placed.  Hyperlipidemia, statin intolerance Reduce saturated fat intake. Continue regular movement/seated exercises.  Weakness generalized Stay hydrated and eat a well balanced diet.     FOLLOW-UP:  Return in about 3 months (around 07/24/2016).

## 2016-04-26 NOTE — Assessment & Plan Note (Signed)
Reduce saturated fat intake. Continue regular movement/seated exercises.

## 2016-04-26 NOTE — Addendum Note (Signed)
Addended byLillard Anes D on: 04/26/2016 11:52 AM   Modules accepted: Orders

## 2016-04-26 NOTE — Assessment & Plan Note (Signed)
Continue medications as directed. Has Cardiologist appt. 04/29/2016. Last use of Nitroglycerin was 04/24/16 for SOB that was resolved with only 1 dose.

## 2016-04-26 NOTE — Patient Instructions (Addendum)
Acute Coronary Syndrome Acute coronary syndrome (ACS) is a serious problem in which there is suddenly not enough blood and oxygen supplied to the heart. ACS may mean that one or more of the blood vessels in your heart (coronary arteries) may be blocked. ACS can result in chest pain or a heart attack (myocardial infarction or MI). What are the causes? This condition is caused by atherosclerosis, which is the buildup of fat and cholesterol (plaque) on the inside of the arteries. Over time, the plaque may narrow or block the artery, and this will lessen blood flow to the heart. Plaque can also become weak and break off within a coronary artery to form a clot and cause a sudden blockage. What increases the risk? The risk factors of this condition include: High cholesterol levels. High blood pressure (hypertension). Smoking. Diabetes. Age. Family history of chest pain, heart disease, or stroke. Lack of exercise. What are the signs or symptoms? The most common signs of this condition include: Chest pain, which can be: A crushing or squeezing in the chest. A tightness, pressure, fullness, or heaviness in the chest. Present for more than a few minutes, or it can stop and recur. Pain in the arms, neck, jaw, or back. Unexplained heartburn or indigestion. Shortness of breath. Nausea. Sudden cold sweats. Feeling light-headed or dizzy. Sometimes, this condition has no symptoms. How is this diagnosed? ACS may be diagnosed through the following tests: Electrocardiogram (ECG). Blood tests. Coronary angiogram. This is a procedure to look at the coronary arteries to see if there is any blockage. How is this treated? Treatment for ACS may include: Healthy behavioral changes to reduce or control risk factors. Medicine. Coronary stenting.A stent helps to keep an artery open. Coronary angioplasty. This procedure widens a narrowed or blocked artery. Coronary artery bypass surgery. This will allow your  blood to pass the blockage (bypass) to reach your heart. Follow these instructions at home: Eating and drinking Follow a heart-healthy diet. A dietitian can you help to educate you about healthy food options and changes. Use healthy cooking methods such as roasting, grilling, broiling, baking, poaching, steaming, or stir-frying. Talk to a dietitian to learn more about healthy cooking methods. Medicines Take medicines only as directed by your health care provider. Do not take the following medicines unless your health care provider approves: Nonsteroidal anti-inflammatory drugs (NSAIDs), such as ibuprofen, naproxen, or celecoxib. Vitamin supplements that contain vitamin A, vitamin E, or both. Hormone replacement therapy that contains estrogen with or without progestin. Stop illegal drug use. Activity Follow an exercise program that is approved by your health care provider. Plan rest periods when you are fatigued. Lifestyle Do not use any tobacco products, including cigarettes, chewing tobacco, or electronic cigarettes. If you need help quitting, ask your health care provider. If you drink alcohol, and your health care provider approves, limit your alcohol intake to no more than 1 drink per day. One drink equals 12 ounces of beer, 5 ounces of wine, or 1 ounces of hard liquor. Learn to manage stress. Maintain a healthy weight. Lose weight as approved by your health care provider. General instructions Manage other health conditions, such as hypertension and diabetes, as directed by your health care provider. Keep all follow-up visits as directed by your health care provider. This is important. Your health care provider may ask you to monitor your blood pressure. A blood pressure reading consists of a higher number over a lower number, such as 110 over 72, written as 110/72. Ideally, your  blood pressure should be: Below 140/90 if you have no other medical conditions. Below 130/80 if you have  diabetes or kidney disease. Get help right away if: You have pain in your chest, neck, arm, jaw, stomach, or back that lasts more than a few minutes, is recurring, or is not relieved by taking medicine under your tongue (sublingual nitroglycerin). You have profuse sweating without cause. You have unexplained: Heartburn or indigestion. Shortness of breath or difficulty breathing. Nausea or vomiting. Fatigue. Feelings of nervousness or anxiety. Weakness. Diarrhea. You have sudden light-headedness or dizziness. You faint. These symptoms may represent a serious problem that is an emergency. Do not wait to see if the symptoms will go away. Get medical help right away. Call your local emergency services (911 in the [place] Do not drive yourself to the clinic or hospital.  This information is not intended to replace advice given to you by your health care provider. Make sure you discuss any questions you have with your health care provider. Document Released: 03/01/2005 Document Revised: 08/13/2015 Document Reviewed: 07/03/2013 Elsevier Interactive Patient Education  2017 [employer]. Pressure Injury A pressure injury, sometimes called a bedsore, is an injury to the skin and underlying tissue caused by pressure. Pressure on blood vessels causes decreased blood flow to the skin, which can eventually cause the skin tissue to die and break down into a wound. Pressure injuries usually occur:  Over bony parts of the body such as the tailbone, shoulders, elbows, hips, and heels.  Under medical devices such as respiratory equipment, stockings, tubes, and splints. Pressure injuries start as reddened areas on the skin and can lead to pain, muscle damage, and infection. Pressure injuries can vary in severity. What are the causes? Pressure injuries are caused by a lack of blood supply to an area of skin. They can occur from intense pressure over a short period of time or from less intense pressure over a  long period of time. What increases the risk? This condition is more likely to develop in people who:  Are in the hospital or an extended care facility.  Are bedridden or in a wheelchair.  Have an injury or disease that keeps them from:  Moving normally.  Feeling pain or pressure.  Have a condition that:  Makes them sleepy or less alert.  Causes poor blood flow.  Need to wear a medical device.  Have poor control of their bladder or bowel functions (incontinence).  Have poor nutrition (malnutrition).  Are of certain ethnicities. People of African American and Latino or Hispanic descent are at higher risk compared to other ethnic groups. If you are at risk for pressure ulcers, your health care provider may recommend certain types of bedding to help prevent them. These may include foam or gel mattresses covered with one of the following:  A sheepskin blanket.  A pad that is filled with gel, air, water, or foam. What are the signs or symptoms? The main symptom is a blister or change in skin color that opens into a wound. Other symptoms include:  Red or dark areas of skin that do not turn white or pale when pressed with a finger.  Pain, warmth, or change of skin texture. How is this diagnosed? This condition is diagnosed with a medical history and physical exam. You may also have tests, including:  Blood tests to check for infection or signs of poor nutrition.  Imaging studies to check for damage to the deep tissues under your skin.  Blood  flow studies. Your pressure injury will be staged to determine its severity. Staging is an assessment of:  The depth of the pressure injury.  Which tissues are exposed because of the pressure injury.  The causes of the pressure injury. How is this treated? The main focus of treatment is to help your injury heal. This may be done by:  Relieving or redistributing pressure on your skin. This includes:  Frequently changing your  position.  Eliminating or minimizing positions that caused the wound or that can make the wound worse.  Using specific bed mattresses and chair cushions.  Refitting, resizing, or replacing any medical devices, or padding the skin under them.  Using creams or powders to prevent rubbing (friction) on the skin.  Keeping your skin clean and dry. This may include using a skin cleanser or skin protectant as told by your health care provider. This may be a lotion, ointment, or spray.  Cleaning your injury and removing any dead tissue from the wound (debridement).  Placing a bandage (dressing) over your injury.  Preventing or treating infection. This may include antibiotic, antimicrobial, or antiseptic medicines. Treatment may also include medicine for pain. Sometimes surgery is needed to close the wound with a flap of healthy skin or a piece of skin from another area of your body (graft). You may need surgery if other treatments are not working or if your injury is very deep. Follow these instructions at home: Wound care  Follow instructions from your health care provider about:  How to take care of your wound.  When and how you should change your dressing.  When you should remove your dressing. If your dressing is dry and stuck when you try to remove it, moisten or wet the dressing with saline or water so that it can be removed without harming your skin or wound tissue.  Check your wound every day for signs of infection. Have a caregiver do this for you if you are not able. Watch for:  More redness, swelling, or pain.  More fluid, blood, or pus.  A bad smell. Skin Care  Keep your skin clean and dry. Gently pat your skin dry.  Do not rub or massage your skin.  Use a skin protectant only as told by your health care provider.  Check your skin every day for any changes in color or any new blisters or sores (ulcers). Have a caregiver do this for you if you are not  able. Medicines  Take over-the-counter and prescription medicines only as told by your health care provider.  If you were prescribed an antibiotic medicine, take it or apply it as told by your health care provider. Do not stop taking or using the antibiotic even if your condition improves. Reducing and Redistributing Pressure  Do not lie or sit in one position for a long time. Move or change position every two hours or as told by your health care provider.  Use pillows or cushions to reduce pressure. Ask your health care provider to recommend cushions or pads for you.  Use medical devices that do not rub your skin. Tell your health care provider if one of your medical devices is causing a pressure injury to develop. General instructions  Eat a healthy diet that includes lots of protein. Ask your health care provider for diet advice.  Drink enough fluid to keep your urine clear or pale yellow.  Be as active as you can every day. Ask your health care provider to suggest  safe exercises or activities.  Do not abuse drugs or alcohol.  Keep all follow-up visits as told by your health care provider. This is important.  Do not smoke. Contact a health care provider if:   You have chills or fever.  Your pain medicine is not helping.  You have any changes in skin color.  You have new blisters or sores.  You develop warmth, redness, or swelling near a pressure injury.  You have a bad odor or pus coming from your pressure injury.  You lose control of your bowels or bladder.  You develop new symptoms.  Your wound does not improve after 1-2 weeks of treatment.  You develop a new medical condition, such as diabetes, peripheral vascular disease, or conditions that affect your defense (immune) system. This information is not intended to replace advice given to you by your health care provider. Make sure you discuss any questions you have with your health care provider. Document  Released: 03/01/2005 Document Revised: 08/04/2015 Document Reviewed: 07/10/2014   Edema Edema is an abnormal buildup of fluids. It is more common in your legs and thighs. Painless swelling of the feet and ankles is more likely as a person ages. It also is common in looser skin, like around your eyes. Follow these instructions at home:  Keep the affected body part above the level of the heart while lying down.  Do not sit still or stand for a long time.  Do not put anything right under your knees when you lie down.  Do not wear tight clothes on your upper legs.  Exercise your legs to help the puffiness (swelling) go down.  Wear elastic bandages or support stockings as told by your doctor.  A low-salt diet may help lessen the puffiness.  Only take medicine as told by your doctor. Contact a doctor if:  Treatment is not working.  You have heart, liver, or kidney disease and notice that your skin looks puffy or shiny.  You have puffiness in your legs that does not get better when you raise your legs.  You have sudden weight gain for no reason. Get help right away if:  You have shortness of breath or chest pain.  You cannot breathe when you lie down.  You have pain, redness, or warmth in the areas that are puffy.  You have heart, liver, or kidney disease and get edema all of a sudden.  You have a fever and your symptoms get worse all of a sudden. This information is not intended to replace advice given to you by your health care provider. Make sure you discuss any questions you have with your health care provider. Document Released: 08/18/2007 Document Revised: 08/07/2015 Document Reviewed: 12/22/2012 Elsevier Interactive Patient Education  2017 [employer].  Chartered certified [employer] Patient Education  [employer].  Will call when labs result. Refilled all non-cardiac medications, please have Cardiologist refill these (next Cardio appt is 04/29/16). Podiatry referral  placed. Please keep right ankle clean, dry, and do not put pressure on it for extended periods (i.e. Sleeping). To help reduce lower extremity edema, stand/walk every hr.  Also elevate lower extremities several times daily. Please follow-up in 3 months, or sooner if needed.

## 2016-04-26 NOTE — Assessment & Plan Note (Signed)
Checked TSH level today. Continue Levothyroxine as directed.

## 2016-04-27 ENCOUNTER — Other Ambulatory Visit: Payer: Self-pay | Admitting: Adult Health

## 2016-04-27 DIAGNOSIS — E559 Vitamin D deficiency, unspecified: Secondary | ICD-10-CM

## 2016-04-27 LAB — COMPREHENSIVE METABOLIC PANEL
A/G RATIO: 1.3 (ref 1.2–2.2)
ALBUMIN: 3.7 g/dL (ref 3.2–4.6)
ALK PHOS: 110 IU/L (ref 39–117)
ALT: 22 IU/L (ref 0–32)
AST: 48 IU/L — ABNORMAL HIGH (ref 0–40)
BUN / CREAT RATIO: 11 — AB (ref 12–28)
BUN: 16 mg/dL (ref 10–36)
Bilirubin Total: 0.8 mg/dL (ref 0.0–1.2)
CO2: 30 mmol/L — AB (ref 18–29)
Calcium: 8.9 mg/dL (ref 8.7–10.3)
Chloride: 100 mmol/L (ref 96–106)
Creatinine, Ser: 1.43 mg/dL — ABNORMAL HIGH (ref 0.57–1.00)
GFR calc Af Amer: 36 mL/min/{1.73_m2} — ABNORMAL LOW (ref >59)
GFR calc non Af Amer: 31 mL/min/{1.73_m2} — ABNORMAL LOW (ref >59)
GLOBULIN, TOTAL: 2.8 g/dL (ref 1.5–4.5)
Glucose: 80 mg/dL (ref 65–99)
POTASSIUM: 4.4 mmol/L (ref 3.5–5.2)
SODIUM: 146 mmol/L — AB (ref 134–144)
Total Protein: 6.5 g/dL (ref 6.0–8.5)

## 2016-04-27 LAB — LIPID PANEL
CHOL/HDL RATIO: 3.3 ratio (ref 0.0–4.4)
Cholesterol, Total: 157 mg/dL (ref 100–199)
HDL: 48 mg/dL (ref >39)
LDL Calculated: 87 mg/dL (ref 0–99)
Triglycerides: 109 mg/dL (ref 0–149)
VLDL CHOLESTEROL CAL: 22 mg/dL (ref 5–40)

## 2016-04-27 LAB — CBC WITH DIFFERENTIAL/PLATELET
BASOS: 0 %
Basophils Absolute: 0 10*3/uL (ref 0.0–0.2)
EOS (ABSOLUTE): 0.1 10*3/uL (ref 0.0–0.4)
Eos: 1 %
HEMATOCRIT: 42.4 % (ref 34.0–46.6)
HEMOGLOBIN: 13 g/dL (ref 11.1–15.9)
Immature Grans (Abs): 0 10*3/uL (ref 0.0–0.1)
Immature Granulocytes: 0 %
Lymphocytes Absolute: 1.4 10*3/uL (ref 0.7–3.1)
Lymphs: 18 %
MCH: 31.4 pg (ref 26.6–33.0)
MCHC: 30.7 g/dL — AB (ref 31.5–35.7)
MCV: 102 fL — AB (ref 79–97)
MONOCYTES: 12 %
MONOS ABS: 0.9 10*3/uL (ref 0.1–0.9)
NEUTROS ABS: 5.4 10*3/uL (ref 1.4–7.0)
Neutrophils: 69 %
Platelets: 166 10*3/uL (ref 150–379)
RBC: 4.14 x10E6/uL (ref 3.77–5.28)
RDW: 14.3 % (ref 12.3–15.4)
WBC: 7.7 10*3/uL (ref 3.4–10.8)

## 2016-04-27 LAB — TSH: TSH: 0.518 u[IU]/mL (ref 0.450–4.500)

## 2016-04-27 LAB — HEMOGLOBIN A1C
Est. average glucose Bld gHb Est-mCnc: 111 mg/dL
HEMOGLOBIN A1C: 5.5 % (ref 4.8–5.6)

## 2016-04-27 LAB — VITAMIN B12: Vitamin B-12: 1931 pg/mL — ABNORMAL HIGH (ref 232–1245)

## 2016-04-27 LAB — VITAMIN D 25 HYDROXY (VIT D DEFICIENCY, FRACTURES): VIT D 25 HYDROXY: 23 ng/mL — AB (ref 30.0–100.0)

## 2016-04-27 MED ORDER — VITAMIN B-12 500 MCG PO TABS: 500.0000 ug | ORAL_TABLET | Freq: Every day | ORAL | 0 refills | Status: DC

## 2016-04-27 MED ORDER — VITAMIN D (ERGOCALCIFEROL) 1.25 MG (50000 UNIT) PO CAPS: 50000.0000 [IU] | ORAL_CAPSULE | ORAL | 0 refills | Status: DC

## 2016-04-28 ENCOUNTER — Ambulatory Visit (HOSPITAL_COMMUNITY)
Admission: RE | Admit: 2016-04-28 | Discharge: 2016-04-28 | Disposition: A | Discharge status: Home / Self Care | Payer: Medicare HMO | Source: Ambulatory Visit | Attending: Diagnostic Radiology | Admitting: Diagnostic Radiology

## 2016-04-28 DIAGNOSIS — I739 Peripheral vascular disease, unspecified: Secondary | ICD-10-CM | POA: Diagnosis not present

## 2016-04-28 DIAGNOSIS — I700 Atherosclerosis of aorta: Secondary | ICD-10-CM | POA: Diagnosis not present

## 2016-04-28 DIAGNOSIS — I714 Abdominal aortic aneurysm, without rupture, unspecified: Secondary | ICD-10-CM

## 2016-04-30 ENCOUNTER — Telehealth: Payer: Self-pay | Admitting: Cardiovascular Disease

## 2016-04-30 NOTE — Telephone Encounter (Signed)
New message     Pt daughter is returning call to [facility].

## 2016-04-30 NOTE — Telephone Encounter (Signed)
Returned call to patient's daughter [name] doppler results given.

## 2016-05-03 ENCOUNTER — Ambulatory Visit (INDEPENDENT_AMBULATORY_CARE_PROVIDER_SITE_OTHER): Payer: Medicare HMO | Admitting: *Deleted

## 2016-05-03 ENCOUNTER — Telehealth: Payer: Self-pay | Admitting: Cardiology

## 2016-05-03 ENCOUNTER — Other Ambulatory Visit: Payer: Self-pay | Admitting: Adult Health

## 2016-05-03 DIAGNOSIS — Z950 Presence of cardiac pacemaker: Secondary | ICD-10-CM

## 2016-05-03 NOTE — Telephone Encounter (Signed)
LMOVM reminding pt to send remote transmission.   

## 2016-05-03 NOTE — Telephone Encounter (Signed)
PT's daughter clld states Patient has been without furosemide / Lasix (20 Mg)  since Dec 2017-- she takes 1 a day-- request Rx be called into [facility] in [place]--  Thanks,  Baker Janus

## 2016-05-04 ENCOUNTER — Other Ambulatory Visit: Payer: Self-pay | Admitting: Adult Health

## 2016-05-04 ENCOUNTER — Telehealth: Payer: Self-pay | Admitting: Adult Health

## 2016-05-04 ENCOUNTER — Encounter: Payer: Self-pay | Admitting: Cardiology

## 2016-05-04 DIAGNOSIS — I509 Heart failure, unspecified: Secondary | ICD-10-CM

## 2016-05-04 LAB — CUP PACEART REMOTE DEVICE CHECK
Battery Impedance: 6828 Ohm
Battery Remaining Longevity: 1 mo
Battery Voltage: 2.63 V
Brady Statistic AS VP Percent: 4 %
Implantable Lead Implant Date: 20090723
Implantable Lead Location: 753859
Implantable Lead Location: 753860
Implantable Lead Model: 5076
Implantable Lead Model: 5076
Implantable Pulse Generator Implant Date: 20090723
Lead Channel Impedance Value: 508 Ohm
Lead Channel Pacing Threshold Amplitude: 0.75 V
Lead Channel Pacing Threshold Pulse Width: 0.4 ms
Lead Channel Setting Pacing Amplitude: 1.5 V
Lead Channel Setting Pacing Amplitude: 2 V
Lead Channel Setting Pacing Pulse Width: 0.4 ms
Lead Channel Setting Sensing Sensitivity: 4 mV
MDC IDC LEAD IMPLANT DT: 20090723
MDC IDC MSMT LEADCHNL RA PACING THRESHOLD PULSEWIDTH: 0.4 ms
MDC IDC MSMT LEADCHNL RV IMPEDANCE VALUE: 457 Ohm
MDC IDC MSMT LEADCHNL RV PACING THRESHOLD AMPLITUDE: 0.875 V
MDC IDC SESS DTM: 20180219182343
MDC IDC STAT BRADY AP VP PERCENT: 2 %
MDC IDC STAT BRADY AP VS PERCENT: 79 %
MDC IDC STAT BRADY AS VS PERCENT: 15 %

## 2016-05-04 MED ORDER — KLOR-CON M10 10 MEQ PO TBCR: 10.0000 meq | EXTENDED_RELEASE_TABLET | Freq: Two times a day (BID) | ORAL | 0 refills | Status: DC

## 2016-05-04 MED ORDER — FUROSEMIDE 20 MG PO TABS: 20.0000 mg | ORAL_TABLET | Freq: Every day | ORAL | 3 refills | Status: DC

## 2016-05-04 NOTE — Telephone Encounter (Signed)
Patient is requesting a refill of lasix ASAP

## 2016-05-04 NOTE — Telephone Encounter (Signed)
Patient wants a refill on furosemide 20 mg. Last prescribed by Domenic Polite, MD. Please advise.

## 2016-05-04 NOTE — Addendum Note (Signed)
Addended by: Narda Rutherford on: 05/04/2016 11:53 AM   Modules accepted: Orders

## 2016-05-04 NOTE — Progress Notes (Signed)
Remote pacemaker transmission.   

## 2016-05-05 NOTE — Telephone Encounter (Signed)
Done

## 2016-05-05 NOTE — Telephone Encounter (Signed)
Left message advising of refill.  

## 2016-06-03 ENCOUNTER — Telehealth: Payer: Self-pay | Admitting: Cardiology

## 2016-06-03 ENCOUNTER — Ambulatory Visit (INDEPENDENT_AMBULATORY_CARE_PROVIDER_SITE_OTHER): Payer: Medicare HMO | Admitting: *Deleted

## 2016-06-03 DIAGNOSIS — I495 Sick sinus syndrome: Secondary | ICD-10-CM

## 2016-06-03 NOTE — Telephone Encounter (Signed)
LMOVM reminding pt to send remote transmission.   

## 2016-06-04 ENCOUNTER — Encounter: Payer: Self-pay | Admitting: Cardiology

## 2016-06-04 NOTE — Progress Notes (Signed)
Remote pacemaker transmission.   

## 2016-06-05 LAB — CUP PACEART REMOTE DEVICE CHECK
Battery Remaining Longevity: 1 mo — CL
Brady Statistic AP VP Percent: 2 %
Implantable Lead Implant Date: 20090723
Implantable Lead Location: 753860
Implantable Pulse Generator Implant Date: 20090723
Lead Channel Impedance Value: 539 Ohm
Lead Channel Pacing Threshold Amplitude: 0.875 V
Lead Channel Pacing Threshold Pulse Width: 0.4 ms
Lead Channel Setting Pacing Amplitude: 1.5 V
Lead Channel Setting Sensing Sensitivity: 4 mV
MDC IDC LEAD IMPLANT DT: 20090723
MDC IDC LEAD LOCATION: 753859
MDC IDC MSMT BATTERY IMPEDANCE: 7132 Ohm
MDC IDC MSMT BATTERY VOLTAGE: 2.62 V
MDC IDC MSMT LEADCHNL RA PACING THRESHOLD AMPLITUDE: 0.75 V
MDC IDC MSMT LEADCHNL RA PACING THRESHOLD PULSEWIDTH: 0.4 ms
MDC IDC MSMT LEADCHNL RV IMPEDANCE VALUE: 488 Ohm
MDC IDC SESS DTM: 20180322172602
MDC IDC SET LEADCHNL RV PACING AMPLITUDE: 2 V
MDC IDC SET LEADCHNL RV PACING PULSEWIDTH: 0.4 ms
MDC IDC STAT BRADY AP VS PERCENT: 77 %
MDC IDC STAT BRADY AS VP PERCENT: 4 %
MDC IDC STAT BRADY AS VS PERCENT: 17 %

## 2016-06-13 ENCOUNTER — Encounter (HOSPITAL_COMMUNITY): Payer: Self-pay | Admitting: Emergency Medicine

## 2016-06-13 ENCOUNTER — Inpatient Hospital Stay (HOSPITAL_COMMUNITY)
Admission: EM | Admit: 2016-06-13 | Discharge: 2016-06-16 | DRG: 291 | Disposition: A | Discharge status: Home / Self Care | Payer: Medicare HMO | Attending: Internal Medicine | Admitting: Internal Medicine

## 2016-06-13 ENCOUNTER — Emergency Department (HOSPITAL_COMMUNITY): Payer: Medicare HMO

## 2016-06-13 DIAGNOSIS — Z96641 Presence of right artificial hip joint: Secondary | ICD-10-CM | POA: Diagnosis present

## 2016-06-13 DIAGNOSIS — Z681 Body mass index (BMI) 19 or less, adult: Secondary | ICD-10-CM | POA: Diagnosis not present

## 2016-06-13 DIAGNOSIS — D696 Thrombocytopenia, unspecified: Secondary | ICD-10-CM | POA: Diagnosis present

## 2016-06-13 DIAGNOSIS — I495 Sick sinus syndrome: Secondary | ICD-10-CM | POA: Diagnosis not present

## 2016-06-13 DIAGNOSIS — I714 Abdominal aortic aneurysm, without rupture, unspecified: Secondary | ICD-10-CM | POA: Diagnosis present

## 2016-06-13 DIAGNOSIS — I739 Peripheral vascular disease, unspecified: Secondary | ICD-10-CM | POA: Diagnosis present

## 2016-06-13 DIAGNOSIS — I252 Old myocardial infarction: Secondary | ICD-10-CM | POA: Diagnosis not present

## 2016-06-13 DIAGNOSIS — I5033 Acute on chronic diastolic (congestive) heart failure: Secondary | ICD-10-CM | POA: Diagnosis present

## 2016-06-13 DIAGNOSIS — R636 Underweight: Secondary | ICD-10-CM | POA: Diagnosis present

## 2016-06-13 DIAGNOSIS — Z888 Allergy status to other drugs, medicaments and biological substances status: Secondary | ICD-10-CM | POA: Diagnosis not present

## 2016-06-13 DIAGNOSIS — R0602 Shortness of breath: Secondary | ICD-10-CM

## 2016-06-13 DIAGNOSIS — Z8249 Family history of ischemic heart disease and other diseases of the circulatory system: Secondary | ICD-10-CM | POA: Diagnosis not present

## 2016-06-13 DIAGNOSIS — Z87891 Personal history of nicotine dependence: Secondary | ICD-10-CM

## 2016-06-13 DIAGNOSIS — R079 Chest pain, unspecified: Secondary | ICD-10-CM | POA: Diagnosis not present

## 2016-06-13 DIAGNOSIS — I4819 Other persistent atrial fibrillation: Secondary | ICD-10-CM | POA: Diagnosis present

## 2016-06-13 DIAGNOSIS — Z8673 Personal history of transient ischemic attack (TIA), and cerebral infarction without residual deficits: Secondary | ICD-10-CM

## 2016-06-13 DIAGNOSIS — I480 Paroxysmal atrial fibrillation: Secondary | ICD-10-CM | POA: Diagnosis not present

## 2016-06-13 DIAGNOSIS — Z809 Family history of malignant neoplasm, unspecified: Secondary | ICD-10-CM | POA: Diagnosis not present

## 2016-06-13 DIAGNOSIS — I2510 Atherosclerotic heart disease of native coronary artery without angina pectoris: Secondary | ICD-10-CM | POA: Diagnosis not present

## 2016-06-13 DIAGNOSIS — M810 Age-related osteoporosis without current pathological fracture: Secondary | ICD-10-CM | POA: Diagnosis present

## 2016-06-13 DIAGNOSIS — Z79899 Other long term (current) drug therapy: Secondary | ICD-10-CM | POA: Diagnosis not present

## 2016-06-13 DIAGNOSIS — Z955 Presence of coronary angioplasty implant and graft: Secondary | ICD-10-CM

## 2016-06-13 DIAGNOSIS — N183 Chronic kidney disease, stage 3 unspecified: Secondary | ICD-10-CM | POA: Diagnosis present

## 2016-06-13 DIAGNOSIS — I482 Chronic atrial fibrillation: Secondary | ICD-10-CM | POA: Diagnosis present

## 2016-06-13 DIAGNOSIS — J449 Chronic obstructive pulmonary disease, unspecified: Secondary | ICD-10-CM | POA: Diagnosis present

## 2016-06-13 DIAGNOSIS — I130 Hypertensive heart and chronic kidney disease with heart failure and stage 1 through stage 4 chronic kidney disease, or unspecified chronic kidney disease: Principal | ICD-10-CM | POA: Diagnosis present

## 2016-06-13 DIAGNOSIS — H9190 Unspecified hearing loss, unspecified ear: Secondary | ICD-10-CM | POA: Diagnosis present

## 2016-06-13 DIAGNOSIS — Z825 Family history of asthma and other chronic lower respiratory diseases: Secondary | ICD-10-CM

## 2016-06-13 DIAGNOSIS — K219 Gastro-esophageal reflux disease without esophagitis: Secondary | ICD-10-CM | POA: Diagnosis present

## 2016-06-13 DIAGNOSIS — Z66 Do not resuscitate: Secondary | ICD-10-CM | POA: Diagnosis present

## 2016-06-13 DIAGNOSIS — I447 Left bundle-branch block, unspecified: Secondary | ICD-10-CM | POA: Diagnosis present

## 2016-06-13 DIAGNOSIS — Z950 Presence of cardiac pacemaker: Secondary | ICD-10-CM | POA: Diagnosis not present

## 2016-06-13 DIAGNOSIS — I110 Hypertensive heart disease with heart failure: Secondary | ICD-10-CM | POA: Diagnosis not present

## 2016-06-13 DIAGNOSIS — E039 Hypothyroidism, unspecified: Secondary | ICD-10-CM | POA: Diagnosis present

## 2016-06-13 DIAGNOSIS — E038 Other specified hypothyroidism: Secondary | ICD-10-CM | POA: Diagnosis not present

## 2016-06-13 DIAGNOSIS — I5031 Acute diastolic (congestive) heart failure: Secondary | ICD-10-CM | POA: Diagnosis present

## 2016-06-13 DIAGNOSIS — Z823 Family history of stroke: Secondary | ICD-10-CM | POA: Diagnosis not present

## 2016-06-13 DIAGNOSIS — Z833 Family history of diabetes mellitus: Secondary | ICD-10-CM

## 2016-06-13 DIAGNOSIS — I509 Heart failure, unspecified: Secondary | ICD-10-CM

## 2016-06-13 LAB — CBC
HCT: 42.8 % (ref 36.0–46.0)
HEMOGLOBIN: 13.2 g/dL (ref 12.0–15.0)
MCH: 32.6 pg (ref 26.0–34.0)
MCHC: 30.8 g/dL (ref 30.0–36.0)
MCV: 105.7 fL — AB (ref 78.0–100.0)
PLATELETS: 141 10*3/uL — AB (ref 150–400)
RBC: 4.05 MIL/uL (ref 3.87–5.11)
RDW: 16.1 % — ABNORMAL HIGH (ref 11.5–15.5)
WBC: 10.7 10*3/uL — ABNORMAL HIGH (ref 4.0–10.5)

## 2016-06-13 LAB — BASIC METABOLIC PANEL
Anion gap: 11 (ref 5–15)
BUN: 28 mg/dL — ABNORMAL HIGH (ref 6–20)
CALCIUM: 8.6 mg/dL — AB (ref 8.9–10.3)
CO2: 31 mmol/L (ref 22–32)
CREATININE: 1.36 mg/dL — AB (ref 0.44–1.00)
Chloride: 98 mmol/L — ABNORMAL LOW (ref 101–111)
GFR calc Af Amer: 37 mL/min — ABNORMAL LOW (ref >60)
GFR calc non Af Amer: 32 mL/min — ABNORMAL LOW (ref >60)
GLUCOSE: 77 mg/dL (ref 65–99)
Potassium: 4.2 mmol/L (ref 3.5–5.1)
Sodium: 140 mmol/L (ref 135–145)

## 2016-06-13 LAB — I-STAT TROPONIN, ED: TROPONIN I, POC: 0 ng/mL (ref 0.00–0.08)

## 2016-06-13 MED ORDER — NITROGLYCERIN 2 % TD OINT
1.0000 [in_us] | TOPICAL_OINTMENT | Freq: Once | TRANSDERMAL | Status: AC
  Administered 2016-06-14: 1 [in_us] via TOPICAL
  Filled 2016-06-13: qty 1

## 2016-06-13 MED ORDER — FUROSEMIDE 10 MG/ML IJ SOLN
40.0000 mg | Freq: Once | INTRAMUSCULAR | Status: AC
  Administered 2016-06-14: 40 mg via INTRAVENOUS
  Filled 2016-06-13: qty 4

## 2016-06-13 NOTE — ED Provider Notes (Signed)
[facility] DEPT Provider Note   CSN: [id number] Arrival date & time: 06/13/16  2156  By signing my name below, I, Neta Mends, attest that this documentation has been prepared under the direction and in the presence of Merryl Hacker, MD . Electronically Signed: Neta Mends, ED Scribe. 06/13/2016. 11:51 PM.    History   Chief Complaint Chief Complaint  Patient presents with  . Chest Pain    The history is provided by the patient. No language interpreter was used.   HPI Comments:  [name] is a 83 y.o. female with PMHx of CAD and MI who presents to the Emergency Department complaining of right ongoing shoulder pain x 2-3 days. She rates her current pain at 5 or 6/10. Pt complains of associated SOB and moderate leg swelling. She states that her SOB was worse last night. Reports 2 pillow orthopnea. Pt took NTG 3 times today, and daughter reports that pt's shoulder pain resolves when she takes NTG. Pt has had 9 coronary stents and a pacemaker placed, and takes plavix regularly. Pt denies chest pain. Pt is DNR.   Past Medical History:  Diagnosis Date  . Abdominal aortic aneurysm ([place])   . Anemia    hx  . Asthma    hx  . Atrial fibrillation ([place])   . Blood transfusion   . Blood transfusion without reported diagnosis   . CAD (coronary artery disease)   . COPD (chronic obstructive pulmonary disease) ([place])   . GERD (gastroesophageal reflux disease)   . GI bleed 2007  . H/O hiatal hernia   . Heart attack   . Heart disease   . History of right hip replacement 2013  . Hyperlipidemia   . Hypertension   . Hypothyroidism   . Myocardial infarction   . Osteoporosis   . Pacemaker   . Renal failure, chronic   . SSS (sick sinus syndrome) ([place]) 10/05/2007   Medtronic Adapta  . Stroke ([place])   . TIA (transient ischemic attack)   . UTI (lower urinary tract infection)    hx    Patient Active Problem List   Diagnosis Date Noted  . COPD (chronic obstructive  pulmonary disease) ([place]) 11/29/2015  . Abdominal aortic aneurysm (AAA) without rupture ([place]) 08/21/2015  . DNR (do not resuscitate)   . Weakness generalized   . GERD (gastroesophageal reflux disease) 07/04/2015  . Dysphagia 07/04/2015  . Transaminitis 06/24/2015  . HCAP (healthcare-associated pneumonia) 06/22/2015  . Pneumonia 06/22/2015  . COPD exacerbation ([place]) 06/21/2015  . Cough 06/09/2015  . Fever, unspecified 06/09/2015  . Chronic diastolic congestive heart failure ([employer]) 04/24/2015  . Restless legs 04/24/2015  . Restless leg 04/24/2015  . Pain and swelling of left lower extremity 02/15/2015  . Candidiasis of perineum 02/15/2015  . Hemorrhoids 02/15/2015  . CKD (chronic kidney disease) stage 3, GFR 30-59 ml/min 02/06/2015  . Acute respiratory failure with hypoxia ([place]) 01/29/2015  . Acute pulmonary edema ([place]) 01/29/2015  . SOB (shortness of breath)   . Palliative care encounter   . Sepsis ([place]) 01/26/2015  . Cellulitis of leg, left 01/26/2015  . Protein-calorie malnutrition, severe ([place]) 11/06/2014  . Pressure ulcer 11/06/2014  . Acute diastolic CHF (congestive heart failure) ([place]) 11/04/2014  . Weakness 11/04/2014  . Hypertensive heart disease with CHF (congestive heart failure) ([place]) 11/04/2014  . Macrocytic anemia 11/04/2014  . Cardiomyopathy, ischemic 11/04/2014  . CHF exacerbation ([place]) 11/03/2014  . Body mass index (BMI) of 20.0-20.9 in adult 06/23/2014  .  Hard of hearing 06/23/2014  . Rectal bleeding 07/16/2013  . Hyperlipidemia, statin intolerance 04/30/2013  . Paroxysmal atrial fibrillation ([place]) 04/30/2013  . Pacemaker - dual chamber Medtronic 2009 04/30/2013  . AAA (abdominal aortic aneurysm) ([place]) 04/30/2013  . Bilateral carotid artery stenosis 04/30/2013  . PAD (peripheral artery disease) ([place]) 04/30/2013  . Constipation 08/31/2011  . Hypotension 08/28/2011  . S/P total hip arthroplasty 08/28/2011  . CAD (coronary artery disease) 08/28/2011  .  Hypothyroid 08/28/2011  . Klebsiella cystitis 08/28/2011  . Thrombocytopenia ([place]) 08/28/2011  . SSS (sick sinus syndrome) ([place]) 10/05/2007    Past Surgical History:  Procedure Laterality Date  . CARDIAC CATHETERIZATION    . COLONOSCOPY    . CORONARY STENT PLACEMENT     x9  . FRACTURE SURGERY     femur fx, /w ORIF into HIP- 2008  . JOINT REPLACEMENT    . NM MYOCAR PERF WALL MOTION  10/08/2010   mild apical anterior ischemia  . PACEMAKER INSERTION  10/05/2007   Medtronic Adapta  . TOTAL HIP ARTHROPLASTY     planned for 08/27/2011 Left  . TOTAL HIP ARTHROPLASTY  08/27/2011   Procedure: TOTAL HIP ARTHROPLASTY;  Surgeon: Kerin Salen, MD;  Location: [facility];  Service: Orthopedics;  Laterality: Right;  . US ECHOCARDIOGRAPHY  08/23/2011   EF 50%,LA mod. dilated,mild to mod. mitral annular ca+,mod. MR,mild to mod TR,mild to mod. PH,trace AI.    OB History    No data available       Home Medications    Prior to Admission medications   Medication Sig Start Date End Date Taking? Authorizing Provider  acetaminophen (TYLENOL) 500 MG tablet Take 1 tablet (500 mg total) by mouth at bedtime. 04/26/16  Yes Odella Aquas, NP  amiodarone (PACERONE) 200 MG tablet Take 1 tablet (200 mg total) by mouth daily. 03/25/16  Yes Odella Aquas, NP  Biotin 1000 MCG tablet Take 1 tablet (1 mg total) by mouth daily. 04/26/16  Yes Odella Aquas, NP  Calcium Carbonate-Vitamin D (CALCIUM 600+D) 600-400 MG-UNIT tablet Take 1 tablet by mouth daily. 04/26/16  Yes Odella Aquas, NP  carboxymethylcellulose 1 % ophthalmic solution Apply 1 drop to eye 3 (three) times daily. Patient taking differently: Apply 1 drop to eye 3 (three) times daily as needed (dry eyes).  04/26/16  Yes Odella Aquas, NP  clopidogrel (PLAVIX) 75 MG tablet TAKE 1 TABLET (75 MG TOTAL) BY MOUTH DAILY. 03/25/16  Yes Odella Aquas, NP  cycloSPORINE (RESTASIS) 0.05 % ophthalmic emulsion Place 1 drop into both eyes 2 (two) times daily as needed (dryness).    Yes  Historical Provider, MD  Docosanol (ABREVA) 10 % CREA Apply 1 application topically 5 (five) times daily. Apply to affected area on lip  5 x day until healed 04/26/16  Yes Odella Aquas, NP  feeding supplement (BOOST HIGH PROTEIN) LIQD Take 1 Container by mouth See admin instructions. Drink 1 everyday and another as needed for nutrition supplement.   Yes Historical Provider, MD  furosemide (LASIX) 20 MG tablet Take 1 tablet (20 mg total) by mouth daily. 05/04/16  Yes Odella Aquas, NP  KLOR-CON M10 10 MEQ tablet Take 1 tablet (10 mEq total) by mouth 2 (two) times daily. Patient taking differently: Take 10 mEq by mouth daily.  05/04/16  Yes Odella Aquas, NP  levothyroxine (SYNTHROID, LEVOTHROID) 125 MCG tablet Take 1 tablet (125 mcg total) by mouth daily before breakfast. 04/26/16  Yes Valetta Fuller  Loralee Pacas, NP  nitroGLYCERIN (NITROSTAT) 0.4 MG SL tablet Place 1 tablet (0.4 mg total) under the tongue every 5 (five) minutes as needed for chest pain (For a total of 3 tablets, if chest pain persist to call 911). 10/15/14  Yes Lauree Chandler, NP  ranitidine (ZANTAC) 300 MG capsule Take 1 capsule (300 mg total) by mouth every evening. 04/26/16  Yes Odella Aquas, NP  rOPINIRole (REQUIP) 0.25 MG tablet Take 1 tablet (0.25 mg total) by mouth at bedtime. 04/26/16  Yes Odella Aquas, NP  Vitamin D, Ergocalciferol, (DRISDOL) 50000 units CAPS capsule Take 1 capsule (50,000 Units total) by mouth every 7 (seven) days. 04/27/16  Yes Odella Aquas, NP    Family History Family History  Problem Relation Age of Onset  . Emphysema Father   . Heart disease Mother   . Cancer Brother   . Cancer Brother   . Cancer Sister   . Cancer Sister   . Diabetes Daughter   . Atrial fibrillation Daughter   . Atrial fibrillation Daughter   . Stroke Daughter   . Heart Problems Son     Social History Social History  Substance Use Topics  . Smoking status: Former Smoker    Types: Cigarettes  . Smokeless tobacco: Never Used     Comment: quit 40 yrs  ago- was smoking 2 packs a day at the most  . Alcohol use No     Allergies   Nubain [nalbuphine hcl]; Mirtazapine; Pineapple; Statins; and Iodine   Review of Systems Review of Systems  Constitutional: Negative for fever.  Respiratory: Positive for shortness of breath. Negative for cough.   Cardiovascular: Positive for leg swelling. Negative for chest pain.  Musculoskeletal: Positive for myalgias.  All other systems reviewed and are negative.    Physical Exam Updated Vital Signs BP 124/71 (BP Location: Right Arm)   Pulse (!) 102   Temp 97.7 F (36.5 C) (Oral)   Resp (!) 22   Ht 5\' 4"  (1.626 m)   Wt 108 lb (49 kg)   SpO2 94%   BMI 18.54 kg/m   Physical Exam  Constitutional: She is oriented to person, place, and time.  Elderly, no acute distress  HENT:  Head: Normocephalic and atraumatic.  Eyes: Pupils are equal, round, and reactive to light.  Cardiovascular: Normal heart sounds.   Tachycardia, irregular rhythm  Pulmonary/Chest: Effort normal. No respiratory distress. She has no wheezes.  Abdominal: Soft. Bowel sounds are normal. There is no tenderness.  Musculoskeletal: She exhibits edema.  1+ pitting bilateral lower extremity edema  Neurological: She is alert and oriented to person, place, and time.  Skin: Skin is warm and dry.  Psychiatric: She has a normal mood and affect.  Nursing note and vitals reviewed.    ED Treatments / Results  DIAGNOSTIC STUDIES:  Oxygen Saturation is 94% on RA, adequate by my interpretation.    COORDINATION OF CARE:  11:46 PM Discussed treatment plan with pt at bedside and pt agreed to plan.   Labs (all labs ordered are listed, but only abnormal results are displayed) Labs Reviewed  BASIC METABOLIC PANEL - Abnormal; Notable for the following:       Result Value   Chloride 98 (*)    BUN 28 (*)    Creatinine, Ser 1.36 (*)    Calcium 8.6 (*)    GFR calc non Af Amer 32 (*)    GFR calc Af Amer 37 (*)    All  other components  within normal limits  CBC - Abnormal; Notable for the following:    WBC 10.7 (*)    MCV 105.7 (*)    RDW 16.1 (*)    Platelets 141 (*)    All other components within normal limits  BRAIN NATRIURETIC PEPTIDE - Abnormal; Notable for the following:    B Natriuretic Peptide 903.0 (*)    All other components within normal limits  I-STAT TROPOININ, ED    EKG  EKG Interpretation  Date/Time:  Sunday June 13 2016 22:02:18 EDT Ventricular Rate:  100 PR Interval:    QRS Duration: 122 QT Interval:  380 QTC Calculation: 490 R Axis:   -17 Text Interpretation:  Atrial fibrillation Left bundle branch block Abnormal ECG Confirmed by Keyri Salberg  MD, Kynslie Ringle ([id number]) on 06/13/2016 11:39:55 PM       Radiology Dg Chest 2 View  Result Date: 06/13/2016 CLINICAL DATA:  Right posterior shoulder pain chest pain EXAM: CHEST  2 VIEW COMPARISON:  06/22/2015 FINDINGS: There is a left chest wall pacer device with leads in the right atrial appendage and right ventricle. Aortic atherosclerosis. There are bilateral pleural effusions noted right greater in left. Pulmonary vascular congestion is identified. IMPRESSION: Moderate congestive heart failure. Electronically Signed   By: Kerby Moors M.D.   On: 06/13/2016 22:44    Procedures Procedures (including critical care time)  Medications Ordered in ED Medications  furosemide (LASIX) injection 40 mg (40 mg Intravenous Given 06/14/16 0030)  nitroGLYCERIN (NITROGLYN) 2 % ointment 1 inch (1 inch Topical Given 06/14/16 0003)     Initial Impression / Assessment and Plan / ED Course  I have reviewed the triage vital signs and the nursing notes.  Pertinent labs & imaging results that were available during my care of the patient were reviewed by me and considered in my medical decision making (see chart for details).     She presents with right shoulder pain and shortness of breath. She does appear somewhat volume overloaded on exam with lower extremity edema. Chest  x-ray shows some vascular congestion. BNP is elevated to 900. Suspect mild heart failure. Patient was given 40 mg of IV Lasix. Her right shoulder pain does improve with nitroglycerin. EKG and troponin initially reassuring. She is in rate controlled atrial fibrillation at this time. I have requested that her pacemaker be interrogated. She has nitroglycerin ointment applied. She is currently pain-free. Will admit for diuresis and formal chest pain rule out.    Final Clinical Impressions(s) / ED Diagnoses   Final diagnoses:  Acute on chronic diastolic heart failure (HCC)  Shortness of breath    New Prescriptions New Prescriptions   No medications on file   I personally performed the services described in this documentation, which was scribed in my presence. The recorded information has been reviewed and is accurate.     Merryl Hacker, MD 06/14/16 [phone]

## 2016-06-13 NOTE — ED Triage Notes (Signed)
Brought by daughter for c/o right shoulder/chest pain that's been going on for 2 days.  Daughter reports patient has taken 3 nitro throughout the day with relief for a short while.  Also reporting sob and nausea.

## 2016-06-14 ENCOUNTER — Encounter (HOSPITAL_COMMUNITY): Payer: Self-pay | Admitting: Internal Medicine

## 2016-06-14 DIAGNOSIS — E038 Other specified hypothyroidism: Secondary | ICD-10-CM | POA: Diagnosis not present

## 2016-06-14 DIAGNOSIS — I509 Heart failure, unspecified: Secondary | ICD-10-CM

## 2016-06-14 DIAGNOSIS — Z809 Family history of malignant neoplasm, unspecified: Secondary | ICD-10-CM | POA: Diagnosis not present

## 2016-06-14 DIAGNOSIS — K219 Gastro-esophageal reflux disease without esophagitis: Secondary | ICD-10-CM | POA: Diagnosis present

## 2016-06-14 DIAGNOSIS — Z8249 Family history of ischemic heart disease and other diseases of the circulatory system: Secondary | ICD-10-CM | POA: Diagnosis not present

## 2016-06-14 DIAGNOSIS — Z96641 Presence of right artificial hip joint: Secondary | ICD-10-CM | POA: Diagnosis present

## 2016-06-14 DIAGNOSIS — Z833 Family history of diabetes mellitus: Secondary | ICD-10-CM | POA: Diagnosis not present

## 2016-06-14 DIAGNOSIS — M810 Age-related osteoporosis without current pathological fracture: Secondary | ICD-10-CM | POA: Diagnosis present

## 2016-06-14 DIAGNOSIS — Z955 Presence of coronary angioplasty implant and graft: Secondary | ICD-10-CM | POA: Diagnosis not present

## 2016-06-14 DIAGNOSIS — I480 Paroxysmal atrial fibrillation: Secondary | ICD-10-CM | POA: Diagnosis present

## 2016-06-14 DIAGNOSIS — Z888 Allergy status to other drugs, medicaments and biological substances status: Secondary | ICD-10-CM | POA: Diagnosis not present

## 2016-06-14 DIAGNOSIS — N183 Chronic kidney disease, stage 3 (moderate): Secondary | ICD-10-CM | POA: Diagnosis present

## 2016-06-14 DIAGNOSIS — Z825 Family history of asthma and other chronic lower respiratory diseases: Secondary | ICD-10-CM | POA: Diagnosis not present

## 2016-06-14 DIAGNOSIS — Z87891 Personal history of nicotine dependence: Secondary | ICD-10-CM | POA: Diagnosis not present

## 2016-06-14 DIAGNOSIS — Z950 Presence of cardiac pacemaker: Secondary | ICD-10-CM | POA: Diagnosis not present

## 2016-06-14 DIAGNOSIS — I5033 Acute on chronic diastolic (congestive) heart failure: Secondary | ICD-10-CM | POA: Diagnosis present

## 2016-06-14 DIAGNOSIS — J449 Chronic obstructive pulmonary disease, unspecified: Secondary | ICD-10-CM | POA: Diagnosis present

## 2016-06-14 DIAGNOSIS — I447 Left bundle-branch block, unspecified: Secondary | ICD-10-CM | POA: Diagnosis present

## 2016-06-14 DIAGNOSIS — E039 Hypothyroidism, unspecified: Secondary | ICD-10-CM | POA: Diagnosis present

## 2016-06-14 DIAGNOSIS — I252 Old myocardial infarction: Secondary | ICD-10-CM | POA: Diagnosis not present

## 2016-06-14 DIAGNOSIS — I5031 Acute diastolic (congestive) heart failure: Secondary | ICD-10-CM | POA: Diagnosis not present

## 2016-06-14 DIAGNOSIS — Z823 Family history of stroke: Secondary | ICD-10-CM | POA: Diagnosis not present

## 2016-06-14 DIAGNOSIS — I2510 Atherosclerotic heart disease of native coronary artery without angina pectoris: Secondary | ICD-10-CM | POA: Diagnosis present

## 2016-06-14 DIAGNOSIS — Z681 Body mass index (BMI) 19 or less, adult: Secondary | ICD-10-CM | POA: Diagnosis not present

## 2016-06-14 DIAGNOSIS — I495 Sick sinus syndrome: Secondary | ICD-10-CM | POA: Diagnosis present

## 2016-06-14 DIAGNOSIS — Z79899 Other long term (current) drug therapy: Secondary | ICD-10-CM | POA: Diagnosis not present

## 2016-06-14 DIAGNOSIS — I130 Hypertensive heart and chronic kidney disease with heart failure and stage 1 through stage 4 chronic kidney disease, or unspecified chronic kidney disease: Secondary | ICD-10-CM | POA: Diagnosis present

## 2016-06-14 DIAGNOSIS — R0602 Shortness of breath: Secondary | ICD-10-CM | POA: Diagnosis present

## 2016-06-14 DIAGNOSIS — I714 Abdominal aortic aneurysm, without rupture: Secondary | ICD-10-CM | POA: Diagnosis not present

## 2016-06-14 LAB — CBC WITH DIFFERENTIAL/PLATELET
BASOS ABS: 0 10*3/uL (ref 0.0–0.1)
Basophils Relative: 0 %
Eosinophils Absolute: 0.1 10*3/uL (ref 0.0–0.7)
Eosinophils Relative: 1 %
HCT: 43.6 % (ref 36.0–46.0)
Hemoglobin: 13.9 g/dL (ref 12.0–15.0)
LYMPHS ABS: 1.9 10*3/uL (ref 0.7–4.0)
LYMPHS PCT: 19 %
MCH: 33.5 pg (ref 26.0–34.0)
MCHC: 31.9 g/dL (ref 30.0–36.0)
MCV: 105.1 fL — AB (ref 78.0–100.0)
MONO ABS: 0.9 10*3/uL (ref 0.1–1.0)
Monocytes Relative: 9 %
NEUTROS ABS: 7.4 10*3/uL (ref 1.7–7.7)
Neutrophils Relative %: 71 %
Platelets: 129 10*3/uL — ABNORMAL LOW (ref 150–400)
RBC: 4.15 MIL/uL (ref 3.87–5.11)
RDW: 15.7 % — AB (ref 11.5–15.5)
WBC: 10.4 10*3/uL (ref 4.0–10.5)

## 2016-06-14 LAB — COMPREHENSIVE METABOLIC PANEL
ALT: 25 U/L (ref 14–54)
ANION GAP: 10 (ref 5–15)
AST: 53 U/L — AB (ref 15–41)
Albumin: 3.3 g/dL — ABNORMAL LOW (ref 3.5–5.0)
Alkaline Phosphatase: 112 U/L (ref 38–126)
BUN: 27 mg/dL — ABNORMAL HIGH (ref 6–20)
CHLORIDE: 98 mmol/L — AB (ref 101–111)
CO2: 33 mmol/L — ABNORMAL HIGH (ref 22–32)
Calcium: 8.8 mg/dL — ABNORMAL LOW (ref 8.9–10.3)
Creatinine, Ser: 1.36 mg/dL — ABNORMAL HIGH (ref 0.44–1.00)
GFR calc Af Amer: 37 mL/min — ABNORMAL LOW (ref >60)
GFR, EST NON AFRICAN AMERICAN: 32 mL/min — AB (ref >60)
Glucose, Bld: 92 mg/dL (ref 65–99)
POTASSIUM: 3.8 mmol/L (ref 3.5–5.1)
Sodium: 141 mmol/L (ref 135–145)
TOTAL PROTEIN: 6.4 g/dL — AB (ref 6.5–8.1)
Total Bilirubin: 1.2 mg/dL (ref 0.3–1.2)

## 2016-06-14 LAB — TSH: TSH: 0.201 u[IU]/mL — ABNORMAL LOW (ref 0.350–4.500)

## 2016-06-14 LAB — MAGNESIUM: Magnesium: 1.7 mg/dL (ref 1.7–2.4)

## 2016-06-14 LAB — BRAIN NATRIURETIC PEPTIDE: B Natriuretic Peptide: 903 pg/mL — ABNORMAL HIGH (ref 0.0–100.0)

## 2016-06-14 LAB — TROPONIN I

## 2016-06-14 MED ORDER — ROPINIROLE HCL 0.25 MG PO TABS
0.2500 mg | ORAL_TABLET | Freq: Every day | ORAL | Status: DC
  Administered 2016-06-14 – 2016-06-15 (×2): 0.25 mg via ORAL
  Filled 2016-06-14 (×2): qty 1

## 2016-06-14 MED ORDER — ENOXAPARIN SODIUM 30 MG/0.3ML ~~LOC~~ SOLN
30.0000 mg | SUBCUTANEOUS | Status: DC
  Administered 2016-06-14 – 2016-06-16 (×3): 30 mg via SUBCUTANEOUS
  Filled 2016-06-14 (×3): qty 0.3

## 2016-06-14 MED ORDER — FAMOTIDINE 20 MG PO TABS
20.0000 mg | ORAL_TABLET | Freq: Every day | ORAL | Status: DC
  Administered 2016-06-14 – 2016-06-16 (×3): 20 mg via ORAL
  Filled 2016-06-14 (×3): qty 1

## 2016-06-14 MED ORDER — VITAMIN D (ERGOCALCIFEROL) 1.25 MG (50000 UNIT) PO CAPS: 50000.0000 [IU] | ORAL_CAPSULE | ORAL | Status: DC

## 2016-06-14 MED ORDER — LEVOTHYROXINE SODIUM 25 MCG PO TABS
125.0000 ug | ORAL_TABLET | Freq: Every day | ORAL | Status: DC
  Administered 2016-06-14 – 2016-06-15 (×2): 125 ug via ORAL
  Filled 2016-06-14 (×3): qty 1

## 2016-06-14 MED ORDER — CYCLOSPORINE 0.05 % OP EMUL
1.0000 [drp] | Freq: Two times a day (BID) | OPHTHALMIC | Status: DC | PRN
  Filled 2016-06-14: qty 1

## 2016-06-14 MED ORDER — FUROSEMIDE 10 MG/ML IJ SOLN
40.0000 mg | Freq: Two times a day (BID) | INTRAMUSCULAR | Status: DC
  Administered 2016-06-14 – 2016-06-16 (×4): 40 mg via INTRAVENOUS
  Filled 2016-06-14 (×5): qty 4

## 2016-06-14 MED ORDER — NITROGLYCERIN 0.4 MG SL SUBL
0.4000 mg | SUBLINGUAL_TABLET | SUBLINGUAL | Status: DC | PRN
Start: 2016-06-14 — End: 2016-06-16

## 2016-06-14 MED ORDER — HYPROMELLOSE (GONIOSCOPIC) 2.5 % OP SOLN
1.0000 [drp] | Freq: Three times a day (TID) | OPHTHALMIC | Status: DC | PRN
  Filled 2016-06-14: qty 15

## 2016-06-14 MED ORDER — ACETAMINOPHEN 325 MG PO TABS
650.0000 mg | ORAL_TABLET | Freq: Four times a day (QID) | ORAL | Status: DC | PRN
  Administered 2016-06-14 – 2016-06-15 (×2): 325 mg via ORAL
  Filled 2016-06-14 (×2): qty 2

## 2016-06-14 MED ORDER — POTASSIUM CHLORIDE CRYS ER 10 MEQ PO TBCR
10.0000 meq | EXTENDED_RELEASE_TABLET | Freq: Every day | ORAL | Status: DC
  Administered 2016-06-14 – 2016-06-16 (×3): 10 meq via ORAL
  Filled 2016-06-14 (×3): qty 1

## 2016-06-14 MED ORDER — ACETAMINOPHEN 650 MG RE SUPP
650.0000 mg | Freq: Four times a day (QID) | RECTAL | Status: DC | PRN
Start: 2016-06-14 — End: 2016-06-16

## 2016-06-14 MED ORDER — BIOTIN 1000 MCG PO TABS: 1000.0000 ug | ORAL_TABLET | Freq: Every day | ORAL | Status: DC

## 2016-06-14 MED ORDER — CLOPIDOGREL BISULFATE 75 MG PO TABS
75.0000 mg | ORAL_TABLET | Freq: Every day | ORAL | Status: DC
  Administered 2016-06-14 – 2016-06-16 (×3): 75 mg via ORAL
  Filled 2016-06-14 (×3): qty 1

## 2016-06-14 MED ORDER — AMIODARONE HCL 200 MG PO TABS
200.0000 mg | ORAL_TABLET | Freq: Every day | ORAL | Status: DC
  Administered 2016-06-14 – 2016-06-16 (×3): 200 mg via ORAL
  Filled 2016-06-14 (×3): qty 1

## 2016-06-14 MED ORDER — BOOST PLUS PO LIQD
1.0000 | Freq: Every day | ORAL | Status: DC
  Filled 2016-06-14 (×4): qty 237

## 2016-06-14 MED ORDER — LEVOTHYROXINE SODIUM 125 MCG PO TABS
125.0000 ug | ORAL_TABLET | Freq: Every day | ORAL | Status: DC
  Filled 2016-06-14: qty 1

## 2016-06-14 NOTE — ED Notes (Signed)
Spoke to [employer].  Pt has a dual chamber AV pacermaker.  According to [employer], the battery has less than 1-6 months.  They recommend continuing to manage it in clinic.  Today, the patient is in an atrial arrhythmia at a rate of 81-100 and has had 49 episodes of this.  The pacemaker was implanted in July of 2009.

## 2016-06-14 NOTE — Progress Notes (Signed)
Pt arrivied to 2w02 with daughter at bedside. VSS, pt does not c/o any chest pain or SOB. Tele box placed, verified x2. Pt and daughter updated on plan of care. Oriented to room and call light.   Jaymes Graff, RN

## 2016-06-14 NOTE — H&P (Signed)
History and Physical    [name] FIE:[id number] DOB: [birth year] DOA: 06/13/2016  PCP: [name], NP  Patient coming from: Home.  Chief Complaint: Shortness of breath.  HPI: [name] is a 83 y.o. female with history of chronic diastolic CHF, CAD status post stenting, atrial fibrillation, chronic kidney disease presents to the ER because of increasing shortness of breath. Patient states she has been getting increasingly short of breath over the last 3 weeks. Patient also has been having orthopnea and paroxysmal nocturnal dyspnea. Denies any productive cough fever chills or chest pain.   ED Course: On exam patient has elevated JVD and no lower extremity edema. Chest x-ray shows congestion with labs showing BNP of 903. EKG was showing A. fib with LBBB. Patient was given Lasix 40 mg IV and admitted for acute diastolic CHF. Patient usually takes 20 mg of Lasix at home.  Review of Systems: As per HPI, rest all negative.   Past Medical History:  Diagnosis Date  . Abdominal aortic aneurysm ([place])   . Anemia    hx  . Asthma    hx  . Atrial fibrillation ([place])   . Blood transfusion   . Blood transfusion without reported diagnosis   . CAD (coronary artery disease)   . COPD (chronic obstructive pulmonary disease) ([place])   . GERD (gastroesophageal reflux disease)   . GI bleed 2007  . H/O hiatal hernia   . Heart attack   . Heart disease   . History of right hip replacement 2013  . Hyperlipidemia   . Hypertension   . Hypothyroidism   . Myocardial infarction   . Osteoporosis   . Pacemaker   . Renal failure, chronic   . SSS (sick sinus syndrome) ([place]) 10/05/2007   Medtronic Adapta  . Stroke ([place])   . TIA (transient ischemic attack)   . UTI (lower urinary tract infection)    hx    Past Surgical History:  Procedure Laterality Date  . CARDIAC CATHETERIZATION    . COLONOSCOPY    . CORONARY STENT PLACEMENT     x9  . FRACTURE SURGERY     femur fx, /w ORIF into HIP- 2008  .  JOINT REPLACEMENT    . NM MYOCAR PERF WALL MOTION  10/08/2010   mild apical anterior ischemia  . PACEMAKER INSERTION  10/05/2007   Medtronic Adapta  . TOTAL HIP ARTHROPLASTY     planned for 08/27/2011 Left  . TOTAL HIP ARTHROPLASTY  08/27/2011   Procedure: TOTAL HIP ARTHROPLASTY;  Surgeon: Kerin Salen, MD;  Location: [facility];  Service: Orthopedics;  Laterality: Right;  . US ECHOCARDIOGRAPHY  08/23/2011   EF 50%,LA mod. dilated,mild to mod. mitral annular ca+,mod. MR,mild to mod TR,mild to mod. PH,trace AI.     reports that she has quit smoking. Her smoking use included Cigarettes. She has never used smokeless tobacco. She reports that she does not drink alcohol or use drugs.  Allergies  Allergen Reactions  . Nubain [Nalbuphine Hcl] Anaphylaxis  . Mirtazapine Other (See Comments)    "Weakness in legs"  . Pineapple Other (See Comments)    Listed on [facility] 06/2015  . Statins Other (See Comments)    Leg weakness  . Iodine Rash    Family History  Problem Relation Age of Onset  . Emphysema Father   . Heart disease Mother   . Cancer Brother   . Cancer Brother   . Cancer Sister   . Cancer Sister   .  Diabetes Daughter   . Atrial fibrillation Daughter   . Atrial fibrillation Daughter   . Stroke Daughter   . Heart Problems Son     Prior to Admission medications   Medication Sig Start Date End Date Taking? Authorizing Provider  acetaminophen (TYLENOL) 500 MG tablet Take 1 tablet (500 mg total) by mouth at bedtime. 04/26/16  Yes Odella Aquas, NP  amiodarone (PACERONE) 200 MG tablet Take 1 tablet (200 mg total) by mouth daily. 03/25/16  Yes Odella Aquas, NP  Biotin 1000 MCG tablet Take 1 tablet (1 mg total) by mouth daily. 04/26/16  Yes Odella Aquas, NP  Calcium Carbonate-Vitamin D (CALCIUM 600+D) 600-400 MG-UNIT tablet Take 1 tablet by mouth daily. 04/26/16  Yes Odella Aquas, NP  carboxymethylcellulose 1 % ophthalmic solution Apply 1 drop to eye 3 (three) times daily. Patient taking differently:  Apply 1 drop to eye 3 (three) times daily as needed (dry eyes).  04/26/16  Yes Odella Aquas, NP  clopidogrel (PLAVIX) 75 MG tablet TAKE 1 TABLET (75 MG TOTAL) BY MOUTH DAILY. 03/25/16  Yes Odella Aquas, NP  cycloSPORINE (RESTASIS) 0.05 % ophthalmic emulsion Place 1 drop into both eyes 2 (two) times daily as needed (dryness).    Yes Historical Provider, MD  Docosanol (ABREVA) 10 % CREA Apply 1 application topically 5 (five) times daily. Apply to affected area on lip  5 x day until healed 04/26/16  Yes Odella Aquas, NP  feeding supplement (BOOST HIGH PROTEIN) LIQD Take 1 Container by mouth See admin instructions. Drink 1 everyday and another as needed for nutrition supplement.   Yes Historical Provider, MD  furosemide (LASIX) 20 MG tablet Take 1 tablet (20 mg total) by mouth daily. 05/04/16  Yes Odella Aquas, NP  KLOR-CON M10 10 MEQ tablet Take 1 tablet (10 mEq total) by mouth 2 (two) times daily. Patient taking differently: Take 10 mEq by mouth daily.  05/04/16  Yes Odella Aquas, NP  levothyroxine (SYNTHROID, LEVOTHROID) 125 MCG tablet Take 1 tablet (125 mcg total) by mouth daily before breakfast. 04/26/16  Yes Odella Aquas, NP  nitroGLYCERIN (NITROSTAT) 0.4 MG SL tablet Place 1 tablet (0.4 mg total) under the tongue every 5 (five) minutes as needed for chest pain (For a total of 3 tablets, if chest pain persist to call 911). 10/15/14  Yes Lauree Chandler, NP  ranitidine (ZANTAC) 300 MG capsule Take 1 capsule (300 mg total) by mouth every evening. 04/26/16  Yes Odella Aquas, NP  rOPINIRole (REQUIP) 0.25 MG tablet Take 1 tablet (0.25 mg total) by mouth at bedtime. 04/26/16  Yes Odella Aquas, NP  Vitamin D, Ergocalciferol, (DRISDOL) 50000 units CAPS capsule Take 1 capsule (50,000 Units total) by mouth every 7 (seven) days. 04/27/16  Yes Odella Aquas, NP    Physical Exam: Vitals:   06/14/16 0118 06/14/16 0130 06/14/16 0145 06/14/16 0200  BP: 139/85 124/64 116/73 125/65  Pulse:  87 82 82  Resp: (!) 21 20 18 16   Temp:       TempSrc:      SpO2:  93% 95% 94%  Weight:      Height:          Constitutional: Moderately built and nourished. Vitals:   06/14/16 0118 06/14/16 0130 06/14/16 0145 06/14/16 0200  BP: 139/85 124/64 116/73 125/65  Pulse:  87 82 82  Resp: (!) 21 20 18 16   Temp:      TempSrc:  SpO2:  93% 95% 94%  Weight:      Height:       Eyes: Anicteric no pallor. ENMT: No discharge from the ears eyes nose or mouth. Neck: JVD elevated no mass felt. Respiratory: No rhonchi or crepitations. Cardiovascular: S1-S2 heard no murmurs appreciated. Abdomen: Soft nontender bowel sounds present. No guarding or rigidity. Musculoskeletal: Mild edema over the both lower extremity. Skin: Chronic skin changes. Neurologic: Alert awake oriented to time place and person. Moves all extremities. Psychiatric: Appears normal. Normal affect.   Labs on Admission: I have personally reviewed following labs and imaging studies  CBC:  Recent Labs Lab 06/13/16 2217  WBC 10.7*  HGB 13.2  HCT 42.8  MCV 105.7*  PLT 371*   Basic Metabolic Panel:  Recent Labs Lab 06/13/16 2217  NA 140  K 4.2  CL 98*  CO2 31  GLUCOSE 77  BUN 28*  CREATININE 1.36*  CALCIUM 8.6*   GFR: Estimated Creatinine Clearance: 18.3 mL/min (A) (by C-G formula based on SCr of 1.36 mg/dL (H)). Liver Function Tests: No results for input(s): AST, ALT, ALKPHOS, BILITOT, PROT, ALBUMIN in the last 168 hours. No results for input(s): LIPASE, AMYLASE in the last 168 hours. No results for input(s): AMMONIA in the last 168 hours. Coagulation Profile: No results for input(s): INR, PROTIME in the last 168 hours. Cardiac Enzymes: No results for input(s): CKTOTAL, CKMB, CKMBINDEX, TROPONINI in the last 168 hours. BNP (last 3 results) No results for input(s): PROBNP in the last 8760 hours. HbA1C: No results for input(s): HGBA1C in the last 72 hours. CBG: No results for input(s): GLUCAP in the last 168 hours. Lipid Profile: No  results for input(s): CHOL, HDL, LDLCALC, TRIG, CHOLHDL, LDLDIRECT in the last 72 hours. Thyroid Function Tests: No results for input(s): TSH, T4TOTAL, FREET4, T3FREE, THYROIDAB in the last 72 hours. Anemia Panel: No results for input(s): VITAMINB12, FOLATE, FERRITIN, TIBC, IRON, RETICCTPCT in the last 72 hours. Urine analysis:    Component Value Date/Time   COLORURINE YELLOW 06/22/2015 1730   APPEARANCEUR CLOUDY (A) 06/22/2015 1730   LABSPEC 1.008 06/22/2015 1730   PHURINE 5.0 06/22/2015 1730   GLUCOSEU NEGATIVE 06/22/2015 1730   HGBUR SMALL (A) 06/22/2015 1730   BILIRUBINUR NEGATIVE 06/22/2015 1730   BILIRUBINUR neg 06/19/2014 1919   KETONESUR NEGATIVE 06/22/2015 1730   PROTEINUR NEGATIVE 06/22/2015 1730   UROBILINOGEN 0.2 01/26/2015 1230   NITRITE POSITIVE (A) 06/22/2015 1730   LEUKOCYTESUR TRACE (A) 06/22/2015 1730   Sepsis Labs: @LABRCNTIP (procalcitonin:4,lacticidven:4) )No results found for this or any previous visit (from the past 240 hour(s)).   Radiological Exams on Admission: Dg Chest 2 View  Result Date: 06/13/2016 CLINICAL DATA:  Right posterior shoulder pain chest pain EXAM: CHEST  2 VIEW COMPARISON:  06/22/2015 FINDINGS: There is a left chest wall pacer device with leads in the right atrial appendage and right ventricle. Aortic atherosclerosis. There are bilateral pleural effusions noted right greater in left. Pulmonary vascular congestion is identified. IMPRESSION: Moderate congestive heart failure. Electronically Signed   By: Kerby Moors M.D.   On: 06/13/2016 22:44    EKG: Independently reviewed. A. fib with rate around 100 bpm L BBB.  Assessment/Plan Principal Problem:   Acute diastolic CHF (congestive heart failure) (HCC) Active Problems:   CAD (coronary artery disease)   Hypothyroid   Paroxysmal atrial fibrillation (HCC)   AAA (abdominal aortic aneurysm) (HCC)   PAD (peripheral artery disease) (HCC)   SSS (sick sinus syndrome) (HCC)   CKD (chronic  kidney disease) stage 3, GFR 30-59 ml/min   CHF (congestive heart failure) ([place])    1. Acute on chronic diastolic CHF last EF measured in August 2016 was 55-60% - patient is placed on Lasix 40 mg IV every 12. Not on ACEI or ARB due to renal failure. Closely follow intake output and metabolic panel and daily weights. 2. Chronic atrial fibrillation - patient was not felt to be a candidate for anticoagulation. Patient is on Plavix. Continue amiodarone. 3. History of CAD status post stenting - denies any chest pain. 4. Hypothyroidism on Synthroid. 5. Thrombocytopenia - follow CBC. 6. Abdominal aortic aneurysm as per the chart - patient denies any chest pain. 7. Sick sinus syndrome status post pacemaker placement.   DVT prophylaxis: Lovenox. Code Status: DO NOT RESUSCITATE.  Family Communication: Discussed with patient.  Disposition Plan: Home.  Consults called: None.  Admission status: Inpatient.    Rise Patience MD [facility] Pager [phone].  If 7PM-7AM, please contact night-coverage [email or web address] Password [email or web address]  06/14/2016, 2:25 AM

## 2016-06-14 NOTE — Progress Notes (Signed)
PROGRESS NOTE  [name]  HFW:[id number] DOB: 15-Dec-1917 DOA: 06/13/2016 PCP: [name], NP Outpatient Specialists:  Subjective: Hard of hearing, seen with her daughter at bedside, complains about shortness of breath and DOE  Brief Narrative:  [name] is a 83 y.o. female with history of chronic diastolic CHF, CAD status post stenting, atrial fibrillation, chronic kidney disease presents to the ER because of increasing shortness of breath. Patient states she has been getting increasingly short of breath over the last 3 weeks. Patient also has been having orthopnea and paroxysmal nocturnal dyspnea. Denies any productive cough fever chills or chest pain.   Assessment & Plan:   Principal Problem:   Acute diastolic CHF (congestive heart failure) (HCC) Active Problems:   CAD (coronary artery disease)   Hypothyroid   Paroxysmal atrial fibrillation (HCC)   AAA (abdominal aortic aneurysm) (HCC)   PAD (peripheral artery disease) (HCC)   SSS (sick sinus syndrome) (HCC)   CKD (chronic kidney disease) stage 3, GFR 30-59 ml/min   CHF (congestive heart failure) ([place])   This is a no charge note, patient seen earlier today by my colleague Dr. Hal Hope. Patient seen and examined, and data base reviewed. Presented with acute on chronic diastolic CHF started on IV diuresis.   DVT prophylaxis:  Code Status: DNR Family Communication:  Disposition Plan:  Diet: Diet Heart Room service appropriate? Yes; Fluid consistency: Thin; Fluid restriction: 1200 mL Fluid  Consultants:   None  Procedures:   None  Antimicrobials:   None   Objective: Vitals:   06/14/16 0130 06/14/16 0145 06/14/16 0200 06/14/16 0248  BP: 124/64 116/73 125/65 (!) 109/53  Pulse: 87 82 82 95  Resp: 20 18 16 16   Temp:    97.5 F (36.4 C)  TempSrc:      SpO2: 93% 95% 94% 97%  Weight:    51.1 kg (112 lb 10.5 oz)  Height:    5\' 4"  (1.626 m)    Intake/Output Summary (Last 24 hours) at 06/14/16 1045 Last  data filed at 06/14/16 0256  Gross per 24 hour  Intake                0 ml  Output              700 ml  Net             -700 ml   Filed Weights   06/13/16 2209 06/14/16 0248  Weight: 49 kg (108 lb) 51.1 kg (112 lb 10.5 oz)    Examination: General exam: Appears calm and comfortable  Respiratory system: Clear to auscultation. Respiratory effort normal. Cardiovascular system: S1 & S2 heard, RRR. No JVD, murmurs, rubs, gallops or clicks. No pedal edema. Gastrointestinal system: Abdomen is nondistended, soft and nontender. No organomegaly or masses felt. Normal bowel sounds heard. Central nervous system: Alert and oriented. No focal neurological deficits. Extremities: +2 lower extremity edema Skin: No rashes, lesions or ulcers Psychiatry: Judgement and insight appear normal. Mood & affect appropriate.   Data Reviewed: I have personally reviewed following labs and imaging studies  CBC:  Recent Labs Lab 06/13/16 2217 06/14/16 0244  WBC 10.7* 10.4  NEUTROABS  --  7.4  HGB 13.2 13.9  HCT 42.8 43.6  MCV 105.7* 105.1*  PLT 141* 027*   Basic Metabolic Panel:  Recent Labs Lab 06/13/16 2217 06/14/16 0244  NA 140 141  K 4.2 3.8  CL 98* 98*  CO2 31 33*  GLUCOSE 77 92  BUN  28* 27*  CREATININE 1.36* 1.36*  CALCIUM 8.6* 8.8*  MG  --  1.7   GFR: Estimated Creatinine Clearance: 19.1 mL/min (A) (by C-G formula based on SCr of 1.36 mg/dL (H)). Liver Function Tests:  Recent Labs Lab 06/14/16 0244  AST 53*  ALT 25  ALKPHOS 112  BILITOT 1.2  PROT 6.4*  ALBUMIN 3.3*   No results for input(s): LIPASE, AMYLASE in the last 168 hours. No results for input(s): AMMONIA in the last 168 hours. Coagulation Profile: No results for input(s): INR, PROTIME in the last 168 hours. Cardiac Enzymes:  Recent Labs Lab 06/14/16 0244  TROPONINI <0.03   BNP (last 3 results) No results for input(s): PROBNP in the last 8760 hours. HbA1C: No results for input(s): HGBA1C in the last 72  hours. CBG: No results for input(s): GLUCAP in the last 168 hours. Lipid Profile: No results for input(s): CHOL, HDL, LDLCALC, TRIG, CHOLHDL, LDLDIRECT in the last 72 hours. Thyroid Function Tests:  Recent Labs  06/14/16 0244  TSH 0.201*   Anemia Panel: No results for input(s): VITAMINB12, FOLATE, FERRITIN, TIBC, IRON, RETICCTPCT in the last 72 hours. Urine analysis:    Component Value Date/Time   COLORURINE YELLOW 06/22/2015 1730   APPEARANCEUR CLOUDY (A) 06/22/2015 1730   LABSPEC 1.008 06/22/2015 1730   PHURINE 5.0 06/22/2015 1730   GLUCOSEU NEGATIVE 06/22/2015 1730   HGBUR SMALL (A) 06/22/2015 1730   BILIRUBINUR NEGATIVE 06/22/2015 1730   BILIRUBINUR neg 06/19/2014 1919   KETONESUR NEGATIVE 06/22/2015 1730   PROTEINUR NEGATIVE 06/22/2015 1730   UROBILINOGEN 0.2 01/26/2015 1230   NITRITE POSITIVE (A) 06/22/2015 1730   LEUKOCYTESUR TRACE (A) 06/22/2015 1730   Sepsis Labs: @LABRCNTIP (procalcitonin:4,lacticidven:4)  )No results found for this or any previous visit (from the past 240 hour(s)).   Invalid input(s): PROCALCITONIN, LACTICACIDVEN  Radiology Studies: Dg Chest 2 View  Result Date: 06/13/2016 CLINICAL DATA:  Right posterior shoulder pain chest pain EXAM: CHEST  2 VIEW COMPARISON:  06/22/2015 FINDINGS: There is a left chest wall pacer device with leads in the right atrial appendage and right ventricle. Aortic atherosclerosis. There are bilateral pleural effusions noted right greater in left. Pulmonary vascular congestion is identified. IMPRESSION: Moderate congestive heart failure. Electronically Signed   By: Kerby Moors M.D.   On: 06/13/2016 22:44   Scheduled Meds: . amiodarone  200 mg Oral Daily  . clopidogrel  75 mg Oral Daily  . enoxaparin (LOVENOX) injection  30 mg Subcutaneous Q24H  . famotidine  20 mg Oral Daily  . furosemide  40 mg Intravenous BID  . lactose free nutrition  1 Container Oral Daily  . levothyroxine  125 mcg Oral QAC breakfast  .  potassium chloride  10 mEq Oral Daily  . rOPINIRole  0.25 mg Oral QHS  . [START ON 06/20/2016] Vitamin D (Ergocalciferol)  50,000 Units Oral Q7 days   Continuous Infusions:   LOS: 0 days    Time spent: 35 minutes    Azlee Monforte A, MD [facility] Pager [phone]  If 7PM-7AM, please contact night-coverage [email or web address] Password [email or web address] 06/14/2016, 10:45 AM

## 2016-06-15 DIAGNOSIS — N183 Chronic kidney disease, stage 3 (moderate): Secondary | ICD-10-CM

## 2016-06-15 DIAGNOSIS — I495 Sick sinus syndrome: Secondary | ICD-10-CM

## 2016-06-15 DIAGNOSIS — R0602 Shortness of breath: Secondary | ICD-10-CM

## 2016-06-15 LAB — BASIC METABOLIC PANEL
ANION GAP: 7 (ref 5–15)
BUN: 25 mg/dL — ABNORMAL HIGH (ref 6–20)
CHLORIDE: 98 mmol/L — AB (ref 101–111)
CO2: 35 mmol/L — AB (ref 22–32)
Calcium: 8.3 mg/dL — ABNORMAL LOW (ref 8.9–10.3)
Creatinine, Ser: 1.34 mg/dL — ABNORMAL HIGH (ref 0.44–1.00)
GFR calc Af Amer: 37 mL/min — ABNORMAL LOW (ref >60)
GFR calc non Af Amer: 32 mL/min — ABNORMAL LOW (ref >60)
GLUCOSE: 77 mg/dL (ref 65–99)
POTASSIUM: 3.9 mmol/L (ref 3.5–5.1)
Sodium: 140 mmol/L (ref 135–145)

## 2016-06-15 MED ORDER — LEVOTHYROXINE SODIUM 112 MCG PO TABS
112.0000 ug | ORAL_TABLET | Freq: Every day | ORAL | Status: DC
  Administered 2016-06-16: 112 ug via ORAL
  Filled 2016-06-15: qty 1

## 2016-06-15 NOTE — Progress Notes (Addendum)
PROGRESS NOTE  [name]  YQM:[id number] DOB: 11-02-1917 DOA: 06/13/2016 PCP: [name], NP Outpatient Specialists:  Subjective: Seen with her daughter at bedside, feels better but still has shortness of breath or lower extremity edema. Continue IV diuresis, likely to be discharged in 1-2 days.  Brief Narrative:  [name] is a 83 y.o. female with history of chronic diastolic CHF, CAD status post stenting, atrial fibrillation, chronic kidney disease presents to the ER because of increasing shortness of breath. Patient states she has been getting increasingly short of breath over the last 3 weeks. Patient also has been having orthopnea and paroxysmal nocturnal dyspnea. Denies any productive cough fever chills or chest pain.   Assessment & Plan:   Principal Problem:   Acute diastolic CHF (congestive heart failure) (HCC) Active Problems:   CAD (coronary artery disease)   Hypothyroid   Paroxysmal atrial fibrillation (HCC)   AAA (abdominal aortic aneurysm) (HCC)   PAD (peripheral artery disease) (HCC)   SSS (sick sinus syndrome) (HCC)   CKD (chronic kidney disease) stage 3, GFR 30-59 ml/min   CHF (congestive heart failure) (HCC)   Acute on chronic diastolic CHF -Presented with shortness of breath, orthopnea and elevated BNP. -Last LVEF was measured in August 2016 showed 55-60%. Repeat 2-D echocardiogram. -Placed on IV Lasix 40 mg twice a day, no ACEI/ARB secondary to renal failure. -Continue to follow daily weight, strict intake/output and renal function daily. -Improving, need probably 1-2 more days of IV diuresis.  Chronic atrial fibrillation -Rate is controlled with amiodarone. -CHA2DS2-VASc around 6, not felt to be candidate for to anticoagulation, she is on Plavix.  History of CAD -Status post stenting, continue Plavix, no active issues for now.  Hypothyroidism -TSH is slightly suppressed at 0.201, she is on 125 g, will decrease dose to 112 g.  Sick sinus  syndrome -Status post pacemaker, denies any chest pain or palpitations.  CKD stage III -Patient around her baseline of 1.3, follow renal function closely.   DVT prophylaxis: Subcutaneous heparin Code Status: DNR Family Communication:  Disposition Plan:  Diet: Diet Heart Room service appropriate? Yes; Fluid consistency: Thin; Fluid restriction: 1200 mL Fluid  Consultants:   None  Procedures:   None  Antimicrobials:   None   Objective: Vitals:   06/14/16 1805 06/14/16 1934 06/14/16 2016 06/15/16 0420  BP: (!) 95/56 113/65 124/68 (!) 91/48  Pulse: 92 (!) 101 85 83  Resp:  18 18 18   Temp:  98.1 F (36.7 C) 97.6 F (36.4 C) 98.4 F (36.9 C)  TempSrc:  Oral Oral Oral  SpO2: 95% 94% 98% 91%  Weight:    48.4 kg (106 lb 12.8 oz)  Height:        Intake/Output Summary (Last 24 hours) at 06/15/16 1106 Last data filed at 06/15/16 0900  Gross per 24 hour  Intake              480 ml  Output              700 ml  Net             -220 ml   Filed Weights   06/13/16 2209 06/14/16 0248 06/15/16 0420  Weight: 49 kg (108 lb) 51.1 kg (112 lb 10.5 oz) 48.4 kg (106 lb 12.8 oz)    Examination: General exam: Appears calm and comfortable  Respiratory system: Clear to auscultation. Respiratory effort normal. Cardiovascular system: S1 & S2 heard, RRR. No JVD, murmurs, rubs, gallops or clicks.  No pedal edema. Gastrointestinal system: Abdomen is nondistended, soft and nontender. No organomegaly or masses felt. Normal bowel sounds heard. Central nervous system: Alert and oriented. No focal neurological deficits. Extremities: +2 lower extremity edema Skin: No rashes, lesions or ulcers Psychiatry: Judgement and insight appear normal. Mood & affect appropriate.   Data Reviewed: I have personally reviewed following labs and imaging studies  CBC:  Recent Labs Lab 06/13/16 2217 06/14/16 0244  WBC 10.7* 10.4  NEUTROABS  --  7.4  HGB 13.2 13.9  HCT 42.8 43.6  MCV 105.7* 105.1*  PLT  141* 355*   Basic Metabolic Panel:  Recent Labs Lab 06/13/16 2217 06/14/16 0244 06/15/16 0301  NA 140 141 140  K 4.2 3.8 3.9  CL 98* 98* 98*  CO2 31 33* 35*  GLUCOSE 77 92 77  BUN 28* 27* 25*  CREATININE 1.36* 1.36* 1.34*  CALCIUM 8.6* 8.8* 8.3*  MG  --  1.7  --    GFR: Estimated Creatinine Clearance: 18.3 mL/min (A) (by C-G formula based on SCr of 1.34 mg/dL (H)). Liver Function Tests:  Recent Labs Lab 06/14/16 0244  AST 53*  ALT 25  ALKPHOS 112  BILITOT 1.2  PROT 6.4*  ALBUMIN 3.3*   No results for input(s): LIPASE, AMYLASE in the last 168 hours. No results for input(s): AMMONIA in the last 168 hours. Coagulation Profile: No results for input(s): INR, PROTIME in the last 168 hours. Cardiac Enzymes:  Recent Labs Lab 06/14/16 0244  TROPONINI <0.03   BNP (last 3 results) No results for input(s): PROBNP in the last 8760 hours. HbA1C: No results for input(s): HGBA1C in the last 72 hours. CBG: No results for input(s): GLUCAP in the last 168 hours. Lipid Profile: No results for input(s): CHOL, HDL, LDLCALC, TRIG, CHOLHDL, LDLDIRECT in the last 72 hours. Thyroid Function Tests:  Recent Labs  06/14/16 0244  TSH 0.201*   Anemia Panel: No results for input(s): VITAMINB12, FOLATE, FERRITIN, TIBC, IRON, RETICCTPCT in the last 72 hours. Urine analysis:    Component Value Date/Time   COLORURINE YELLOW 06/22/2015 1730   APPEARANCEUR CLOUDY (A) 06/22/2015 1730   LABSPEC 1.008 06/22/2015 1730   PHURINE 5.0 06/22/2015 1730   GLUCOSEU NEGATIVE 06/22/2015 1730   HGBUR SMALL (A) 06/22/2015 1730   BILIRUBINUR NEGATIVE 06/22/2015 1730   BILIRUBINUR neg 06/19/2014 1919   KETONESUR NEGATIVE 06/22/2015 1730   PROTEINUR NEGATIVE 06/22/2015 1730   UROBILINOGEN 0.2 01/26/2015 1230   NITRITE POSITIVE (A) 06/22/2015 1730   LEUKOCYTESUR TRACE (A) 06/22/2015 1730   Sepsis Labs: @LABRCNTIP (procalcitonin:4,lacticidven:4)  )No results found for this or any previous visit  (from the past 240 hour(s)).   Invalid input(s): PROCALCITONIN, LACTICACIDVEN  Radiology Studies: Dg Chest 2 View  Result Date: 06/13/2016 CLINICAL DATA:  Right posterior shoulder pain chest pain EXAM: CHEST  2 VIEW COMPARISON:  06/22/2015 FINDINGS: There is a left chest wall pacer device with leads in the right atrial appendage and right ventricle. Aortic atherosclerosis. There are bilateral pleural effusions noted right greater in left. Pulmonary vascular congestion is identified. IMPRESSION: Moderate congestive heart failure. Electronically Signed   By: Kerby Moors M.D.   On: 06/13/2016 22:44   Scheduled Meds: . amiodarone  200 mg Oral Daily  . clopidogrel  75 mg Oral Daily  . enoxaparin (LOVENOX) injection  30 mg Subcutaneous Q24H  . famotidine  20 mg Oral Daily  . furosemide  40 mg Intravenous BID  . lactose free nutrition  1 Container Oral Daily  .  levothyroxine  125 mcg Oral QAC breakfast  . potassium chloride  10 mEq Oral Daily  . rOPINIRole  0.25 mg Oral QHS  . [START ON 06/20/2016] Vitamin D (Ergocalciferol)  50,000 Units Oral Q7 days   Continuous Infusions:   LOS: 1 day    Time spent: 35 minutes    Tumeka Chimenti A, MD [facility] Pager [phone]  If 7PM-7AM, please contact night-coverage [email or web address] Password [email or web address] 06/15/2016, 11:06 AM

## 2016-06-15 NOTE — Care Management Note (Signed)
Case Management Note Marvetta Gibbons RN, BSN Unit 2W-Case Manager [id number]  Patient Details  Name: [name] MRN: [id number] Date of Birth: 1917/05/21  Subjective/Objective:   Pt admitted with HF                 Action/Plan: PTA pt lived at home with daughter- is able to do some ADLs- spoke with one daughter at bedside- plan is to return home- has needed DME at home- pt uses RW per daughter- has had [place] in past with [facility]- open to [facility] services again if recommended- have placed PT eval per [facility] screen protocol.  CM will follow for any recommendations.   Expected Discharge Date:  06/16/16               Expected Discharge Plan:  [place]  In-House Referral:     Discharge planning Services  CM Consult  Post Acute Care Choice:  Home Health Choice offered to:     DME Arranged:    DME Agency:     HH Arranged:    [place] Agency:     Status of Service:  In process, will continue to follow  If discussed at Long Length of Stay Meetings, dates discussed:    Discharge Disposition:   Additional Comments:  Dawayne Patricia, RN 06/15/2016, 3:22 PM

## 2016-06-16 ENCOUNTER — Other Ambulatory Visit: Payer: Self-pay | Admitting: *Deleted

## 2016-06-16 DIAGNOSIS — I480 Paroxysmal atrial fibrillation: Secondary | ICD-10-CM

## 2016-06-16 DIAGNOSIS — I5041 Acute combined systolic (congestive) and diastolic (congestive) heart failure: Secondary | ICD-10-CM

## 2016-06-16 DIAGNOSIS — I2510 Atherosclerotic heart disease of native coronary artery without angina pectoris: Secondary | ICD-10-CM

## 2016-06-16 DIAGNOSIS — I5031 Acute diastolic (congestive) heart failure: Secondary | ICD-10-CM

## 2016-06-16 DIAGNOSIS — I714 Abdominal aortic aneurysm, without rupture: Secondary | ICD-10-CM

## 2016-06-16 DIAGNOSIS — E038 Other specified hypothyroidism: Secondary | ICD-10-CM

## 2016-06-16 DIAGNOSIS — I5033 Acute on chronic diastolic (congestive) heart failure: Secondary | ICD-10-CM

## 2016-06-16 LAB — BASIC METABOLIC PANEL
Anion gap: 7 (ref 5–15)
BUN: 23 mg/dL — AB (ref 6–20)
CHLORIDE: 97 mmol/L — AB (ref 101–111)
CO2: 36 mmol/L — ABNORMAL HIGH (ref 22–32)
Calcium: 8.4 mg/dL — ABNORMAL LOW (ref 8.9–10.3)
Creatinine, Ser: 1.3 mg/dL — ABNORMAL HIGH (ref 0.44–1.00)
GFR calc Af Amer: 39 mL/min — ABNORMAL LOW (ref >60)
GFR calc non Af Amer: 33 mL/min — ABNORMAL LOW (ref >60)
Glucose, Bld: 81 mg/dL (ref 65–99)
POTASSIUM: 4.3 mmol/L (ref 3.5–5.1)
SODIUM: 140 mmol/L (ref 135–145)

## 2016-06-16 MED ORDER — FUROSEMIDE 20 MG PO TABS: 40.0000 mg | ORAL_TABLET | Freq: Every day | ORAL | 3 refills | Status: DC

## 2016-06-16 NOTE — Discharge Summary (Signed)
Physician Discharge Summary  [name] JSE:[id number] DOB: [birth year] DOA: 06/13/2016  PCP: [name], NP  Admit date: 06/13/2016 Discharge date: 06/16/2016  Admitted From: Home Discharge disposition: Home   Recommendations for Outpatient Follow-Up:   1. Home health RN and PT arranged. 2. TSH slightly suppressed, repeat as an outpatient.   Discharge Diagnosis:   Principal Problem:   Acute diastolic CHF (congestive heart failure) (HCC) Active Problems:   CAD (coronary artery disease)   Hypothyroid   Paroxysmal atrial fibrillation (HCC)   AAA (abdominal aortic aneurysm) (HCC)   PAD (peripheral artery disease) (HCC)   SSS (sick sinus syndrome) (HCC)   CKD (chronic kidney disease) stage 3, GFR 30-59 ml/min   CHF (congestive heart failure) ([place])    Discharge Condition: Improved.  Diet recommendation: Low sodium, heart healthy.    History of Present Illness:   [name] a 83 y.o.femalewith history of chronic diastolic CHF, CAD status post stenting, atrial fibrillation, chronic kidney disease who was admitted 06/13/16 for evaluation of a 3 week history of increasing shortness of breath associated with orthopnea and paroxysmal nocturnal dyspnea. No reports of associated productive cough fever chills or chest pain.   Hospital Course by Problem:   Principal Problem:   Acute on chronic diastolic CHF (congestive heart failure) (HCC) EF 55-60 percent August 2016 with repeat echocardiogram canceled given that she is of advanced age, and care is mainly supportive at this point. Placed on IV Lasix 40 mg twice a day for diuresis. Weight down about 7 pounds. Avoid ACE inhibitor/ARB secondary to renal failure.  Active Problems:   CAD (coronary artery disease) Status post stenting, continue Plavix, no active issues for now.    Hypothyroid TSH is slightly suppressed at 0.201, recommend outpatient follow-up.    Paroxysmal atrial fibrillation ([place]) CHA2DS2-VAScaround 6,  not felt to be candidate for to anticoagulation, she is on Plavix and amiodarone.    AAA (abdominal aortic aneurysm) (HCC)/PAD (peripheral artery disease) (HCC) On Plavix. Control blood pressure.    SSS (sick sinus syndrome) ([place]) Status post pacemaker, denies any chest pain or palpitations.    CKD (chronic kidney disease) stage 3, GFR 30-59 ml/min GFR currently less than 20. Baseline creatinine is around 1.1. Current creatinine elevated over usual baseline values.    Underweight Body mass index is 18.09 kg/m. Continue nutritional supplements.   Medical Consultants:    None.   Discharge Exam:   Vitals:   06/16/16 0444 06/16/16 1000  BP: (!) 93/51 103/72  Pulse: 79   Resp: 18   Temp: 97.7 F (36.5 C)    Vitals:   06/15/16 0420 06/15/16 1301 06/16/16 0444 06/16/16 1000  BP: (!) 91/48 111/61 (!) 93/51 103/72  Pulse: 83 93 79   Resp: 18 18 18    Temp: 98.4 F (36.9 C) 97.8 F (36.6 C) 97.7 F (36.5 C)   TempSrc: Oral Oral Oral   SpO2: 91% 96% 91%   Weight: 48.4 kg (106 lb 12.8 oz)  47.8 kg (105 lb 6.4 oz)   Height:        General exam: Elderly female in no acute distress, sitting up in the chair. Respiratory system: HSIR. Respiratory effort normal. Cardiovascular system: S1 & S2 heard, RRR. No JVD,  rubs, gallops or clicks. No murmurs. Gastrointestinal system: Abdomen is nondistended, soft and nontender. No organomegaly or masses felt. Normal bowel sounds heard. Central nervous system: Alert and oriented. No focal neurological deficits. Extremities: No clubbing,  or cyanosis. No edema.  Skin: No rashes, lesions or ulcers. Psychiatry: Judgement and insight appear normal. Mood & affect appropriate.    The results of significant diagnostics from this hospitalization (including imaging, microbiology, ancillary and laboratory) are listed below for reference.     Procedures and Diagnostic Studies:   Dg Chest 2 View  Result Date: 06/13/2016 CLINICAL DATA:  Right  posterior shoulder pain chest pain EXAM: CHEST  2 VIEW COMPARISON:  06/22/2015 FINDINGS: There is a left chest wall pacer device with leads in the right atrial appendage and right ventricle. Aortic atherosclerosis. There are bilateral pleural effusions noted right greater in left. Pulmonary vascular congestion is identified. IMPRESSION: Moderate congestive heart failure. Electronically Signed   By: Kerby Moors M.D.   On: 06/13/2016 22:44     Labs:   Basic Metabolic Panel:  Recent Labs Lab 06/13/16 2217 06/14/16 0244 06/15/16 0301 06/16/16 0348  NA 140 141 140 140  K 4.2 3.8 3.9 4.3  CL 98* 98* 98* 97*  CO2 31 33* 35* 36*  GLUCOSE 77 92 77 81  BUN 28* 27* 25* 23*  CREATININE 1.36* 1.36* 1.34* 1.30*  CALCIUM 8.6* 8.8* 8.3* 8.4*  MG  --  1.7  --   --    GFR Estimated Creatinine Clearance: 18.7 mL/min (A) (by C-G formula based on SCr of 1.3 mg/dL (H)). Liver Function Tests:  Recent Labs Lab 06/14/16 0244  AST 53*  ALT 25  ALKPHOS 112  BILITOT 1.2  PROT 6.4*  ALBUMIN 3.3*   No results for input(s): LIPASE, AMYLASE in the last 168 hours. No results for input(s): AMMONIA in the last 168 hours. Coagulation profile No results for input(s): INR, PROTIME in the last 168 hours.  CBC:  Recent Labs Lab 06/13/16 2217 06/14/16 0244  WBC 10.7* 10.4  NEUTROABS  --  7.4  HGB 13.2 13.9  HCT 42.8 43.6  MCV 105.7* 105.1*  PLT 141* 129*   Cardiac Enzymes:  Recent Labs Lab 06/14/16 0244  TROPONINI <0.03   BNP: Invalid input(s): POCBNP CBG: No results for input(s): GLUCAP in the last 168 hours. D-Dimer No results for input(s): DDIMER in the last 72 hours. Hgb A1c No results for input(s): HGBA1C in the last 72 hours. Lipid Profile No results for input(s): CHOL, HDL, LDLCALC, TRIG, CHOLHDL, LDLDIRECT in the last 72 hours. Thyroid function studies  Recent Labs  06/14/16 0244  TSH 0.201*   Anemia work up No results for input(s): VITAMINB12, FOLATE, FERRITIN,  TIBC, IRON, RETICCTPCT in the last 72 hours. Microbiology No results found for this or any previous visit (from the past 240 hour(s)).   Discharge Instructions:   Discharge Instructions    (HEART FAILURE PATIENTS) Call MD:  Anytime you have any of the following symptoms: 1) 3 pound weight gain in 24 hours or 5 pounds in 1 week 2) shortness of breath, with or without a dry hacking cough 3) swelling in the hands, feet or stomach 4) if you have to sleep on extra pillows at night in order to breathe.    Complete by:  As directed    Call MD for:  extreme fatigue    Complete by:  As directed    Call MD for:  persistant dizziness or light-headedness    Complete by:  As directed    Diet - low sodium heart healthy    Complete by:  As directed    Increase activity slowly    Complete by:  As directed  Allergies as of 06/16/2016      Reactions   Nubain [nalbuphine Hcl] Anaphylaxis   Mirtazapine Other (See Comments)   "Weakness in legs"   Pineapple Other (See Comments)   Listed on [facility] 06/2015   Statins Other (See Comments)   Leg weakness   Iodine Rash      Medication List    TAKE these medications   acetaminophen 500 MG tablet Commonly known as:  TYLENOL Take 1 tablet (500 mg total) by mouth at bedtime.   amiodarone 200 MG tablet Commonly known as:  PACERONE Take 1 tablet (200 mg total) by mouth daily.   Biotin 1000 MCG tablet Take 1 tablet (1 mg total) by mouth daily.   Calcium Carbonate-Vitamin D 600-400 MG-UNIT tablet Commonly known as:  CALCIUM 600+D Take 1 tablet by mouth daily.   carboxymethylcellulose 1 % ophthalmic solution Apply 1 drop to eye 3 (three) times daily. What changed:  when to take this  reasons to take this   clopidogrel 75 MG tablet Commonly known as:  PLAVIX TAKE 1 TABLET (75 MG TOTAL) BY MOUTH DAILY.   cycloSPORINE 0.05 % ophthalmic emulsion Commonly known as:  RESTASIS Place 1 drop into both eyes 2 (two) times daily as needed (dryness).     Docosanol 10 % Crea Commonly known as:  ABREVA Apply 1 application topically 5 (five) times daily. Apply to affected area on lip  5 x day until healed   feeding supplement Liqd Take 1 Container by mouth See admin instructions. Drink 1 everyday and another as needed for nutrition supplement.   furosemide 20 MG tablet Commonly known as:  LASIX Take 2 tablets (40 mg total) by mouth daily. What changed:  how much to take   KLOR-CON M10 10 MEQ tablet Generic drug:  potassium chloride Take 1 tablet (10 mEq total) by mouth 2 (two) times daily. What changed:  when to take this   levothyroxine 125 MCG tablet Commonly known as:  SYNTHROID, LEVOTHROID Take 1 tablet (125 mcg total) by mouth daily before breakfast.   nitroGLYCERIN 0.4 MG SL tablet Commonly known as:  NITROSTAT Place 1 tablet (0.4 mg total) under the tongue every 5 (five) minutes as needed for chest pain (For a total of 3 tablets, if chest pain persist to call 911).   ranitidine 300 MG capsule Commonly known as:  ZANTAC Take 1 capsule (300 mg total) by mouth every evening.   rOPINIRole 0.25 MG tablet Commonly known as:  REQUIP Take 1 tablet (0.25 mg total) by mouth at bedtime.   Vitamin D (Ergocalciferol) 50000 units Caps capsule Commonly known as:  DRISDOL Take 1 capsule (50,000 Units total) by mouth every 7 (seven) days.      Follow-up Information    [name], NP. Schedule an appointment as soon as possible for a visit in 1 week(s).   Specialty:  Family Medicine Why:  To check your blood work. Contact information: [place] [address] [phone]        [facility] Follow up.   Specialty:  Home Health Services Why:  HHRN/PT arranged- please allow 24-48 hr post discharge for them to contact you to set up home visits Contact information: [place] [address] [phone]            Time coordinating discharge: 35  minutes.  Signed:  Cherilynn Schomburg  Pager [phone] [facility] 06/16/2016, 4:50 PM

## 2016-06-16 NOTE — Evaluation (Signed)
Physical Therapy Evaluation Patient Details Name: [name] MRN: [id number] DOB: 1917-06-27 Today's Date: 06/16/2016   History of Present Illness  [name] is a 83 y.o. female with history of chronic diastolic CHF, CAD status post stenting, atrial fibrillation, chronic kidney disease presents to the ER because of increasing shortness of breath. Patient states she has been getting increasingly short of breath over the last 3 weeks.  Clinical Impression  Pt spunky 83 year old.  Pt walked with RW for 150 feet.  Her resting O2 91% and with deep breathing 95%.  After walking 87% but back to 95% within 20 seconds.  Pt educated on use of inspirometer for deep breathing and breathing strategies with mobilty - to not talk while walking and to stop and take 2 deep breaths every 50 feet or so.  Pt and family VU.  Pt ready for home with family and HH assist.    Follow Up Recommendations Home health PT;Supervision for mobility/OOB    Equipment Recommendations  None recommended by PT    Recommendations for Other Services       Precautions / Restrictions Precautions Precautions: Fall Precaution Comments: pt with no recent falls Restrictions Weight Bearing Restrictions: No      Mobility  Bed Mobility                  Transfers Overall transfer level: Needs assistance Equipment used: Rolling walker (2 wheeled) Transfers: Sit to/from Stand Sit to Stand: Supervision         General transfer comment: pt unsteady wtih initial standing - but abel to regain her balance - her kypotic posture makes it harder for her to balance  Ambulation/Gait Ambulation/Gait assistance: Min guard Ambulation Distance (Feet): 150 Feet Assistive device: Rolling walker (2 wheeled) Gait Pattern/deviations: Shuffle;Trunk flexed;Decreased stride length     General Gait Details: pt did well walking - doesnt like the RW versus her rollator.  Pt reports her legs started feeling weak and wobbly at end of  walk.  she did well backing up to sit safely  Stairs            Wheelchair Mobility    Modified Rankin (Stroke Patients Only)       Balance                                             Pertinent Vitals/Pain Pain Assessment:  (no pain - just complaints of leg weakness)    Home Living Family/patient expects to be discharged to:: Private residence Living Arrangements: Children Available Help at Discharge: Family;Available PRN/intermittently           Home Equipment: Walker - 4 wheels;Cane - single point;Shower seat;Hand held shower head Additional Comments: pt has supportive children - they are there daily but not all the time.  Pt does sponge baths but has all equipment for showerng just chooses not to.  pt had [place] as recently as 4 months ago    Prior Function                 Hand Dominance        Extremity/Trunk Assessment   Upper Extremity Assessment Upper Extremity Assessment: Overall WFL for tasks assessed    Lower Extremity Assessment Lower Extremity Assessment: Overall WFL for tasks assessed    Cervical / Trunk Assessment Cervical / Trunk Assessment: Kyphotic  Communication   Communication: HOH  Cognition Arousal/Alertness: Awake/alert Behavior During Therapy: WFL for tasks assessed/performed                                   General Comments: pt talkative and set in her ways - talked about low salt diet and pt not happy with that.  family brought her in a ham buiscuit today for breakfast      General Comments      Exercises     Assessment/Plan    PT Assessment Patient needs continued PT services  PT Problem List Decreased activity tolerance;Decreased mobility;Decreased safety awareness;Cardiopulmonary status limiting activity       PT Treatment Interventions DME instruction;Gait training;Functional mobility training;Therapeutic activities;Therapeutic exercise;Patient/family education    PT Goals  (Current goals can be found in the Care Plan section)  Acute Rehab PT Goals Patient Stated Goal: to go home and have ham buscuits and chinese food PT Goal Formulation: With patient/family Time For Goal Achievement: 06/23/16 Potential to Achieve Goals: Good    Frequency Min 3X/week   Barriers to discharge        Co-evaluation               End of Session Equipment Utilized During Treatment: Gait belt Activity Tolerance: Patient tolerated treatment well;Patient limited by fatigue Patient left: in chair;with family/visitor present Nurse Communication: Mobility status PT Visit Diagnosis: Unsteadiness on feet (R26.81);Muscle weakness (generalized) (M62.81)    Time: 1027-2536 PT Time Calculation (min) (ACUTE ONLY): 30 min   Charges:   PT Evaluation $PT Eval Low Complexity: 1 Procedure PT Treatments $Gait Training: 8-22 mins   PT G Codes:        06-25-16   Rande Lawman, PT   Loyal Buba 06-25-16, 12:17 PM

## 2016-06-16 NOTE — Progress Notes (Signed)
[name] to be D/C'd Home per MD order.  Discussed with the patient and all questions fully answered.  IV catheter discontinued intact. Site without signs and symptoms of complications. Dressing and pressure applied.  An After Visit Summary was printed and given to the patient. Patient received prescription.  D/c education completed with patient/family including follow up instructions, medication list, d/c activities limitations if indicated, with other d/c instructions as indicated by MD - patient able to verbalize understanding, all questions fully answered.   Patient instructed to return to ED, call 911, or call MD for any changes in condition.   Patient to be escorted via [place], and D/C home via private auto.  Kenton Fortin C 06/16/2016 1:00 PM

## 2016-06-16 NOTE — Consult Note (Signed)
[facility] CM Primary Care Navigator  06/16/2016  FRANKEE GRITZ Apr 20, 1917 [id number]  Met with patient and daughters [name] and Pamala Hurry) at the bedside to identify possible discharge needs. Daughter reports that patient was having shortness of breath and pain to right shoulder that had led to this admission.  Patient endorses Mina Marble, NP with Primary Care at [facility] as the primary care provider.    Patient shared using [facility] in [place] to obtain medications without any problem.   Patient's daughter manages medications for her at home using "pill box" system weekly.   Daughters had been providing transportation to her doctors' appointments as stated.  Daughter- [name] is the  primary caregiver at home.   Anticipated discharge plan is home to her daughter's house where she had been living prior to admission.  Patient and her daughters voiced understanding to call primary care provider's office when she gets back home for a post discharge follow-up appointment within a week or sooner if needs arise. Patient letter (with PCP's contact number) was provided as their reminder.  Patient was admitted with diagnoses of HF. Patient and daughters admit to not monitoring nor recording weight at home. They are not aware of the signs and symptoms of HF that will need medical assistance.  They also expressed not being aware of the HF zones/ tool.  Patient's daughter states needing assistance in managing her chronic illnesses. She verbally agreed to be referred to [place] care management coordinator for home visits instead of phone calls (due to age and hard of hearing) to follow-up patient after discharge.   Referral made to [place] care management for signs and symptoms of HF and assistance with managing HF at home.  For additional questions please contact:  Edwena Felty A. Berk Pilot, BSN, RN-BC [facility] PRIMARY CARE Navigator Cell: [phone]

## 2016-06-16 NOTE — Care Management Note (Signed)
Case Management Note Brandy Gibbons RN, BSN Unit 2W-Case Manager [phone]  Patient Details  Name: [name] MRN: [id number] Date of Birth: March 14, 1918  Subjective/Objective:   Pt admitted with HF                 Action/Plan: PTA pt lived at home with daughter- is able to do some ADLs- spoke with one daughter at bedside- plan is to return home- has needed DME at home- pt uses RW per daughter- has had [place] in past with [facility]- open to [facility] services again if recommended- have placed PT eval per [facility] screen protocol.  CM will follow for any recommendations.   Expected Discharge Date:  06/16/16               Expected Discharge Plan:  [place] [facility] [place]  In-House Referral:     Discharge planning Services  CM Consult  Post Acute Care Choice:  Home Health Choice offered to:  Adult Children, Patient  DME Arranged:    DME Agency:     HH Arranged:  RN, Disease Management, PT HH Agency:  [facility]  Status of Service:  Completed, signed off  If discussed at [place] of Stay Meetings, dates discussed:    Discharge Disposition: home/home health  Additional Comments:  06/16/16- 1200- Brandy Kazanjian RN, CM- pt for d/c home today with daughter- spoke with daughter that pt lives with at bedside along with pt- per conversation daughter states pt last used [facility] and that is who they would like to use again for HHRN/PT- no other needs noted- Orders have been placed per MD- referral called to [place] with [facility] for [facility] services.   Brandy Patricia, RN 06/16/2016, 12:10 PM

## 2016-06-17 ENCOUNTER — Other Ambulatory Visit: Payer: Self-pay | Admitting: *Deleted

## 2016-06-17 ENCOUNTER — Telehealth: Payer: Self-pay

## 2016-06-17 NOTE — Telephone Encounter (Signed)
Received refill request for levothyroxine from pharmacy.  However, pt was DC'd from hospital yesterday and inpatient notes state that pt needs to have her thyroid panel repeated.  LVM requesting that pt's daughter, Pamala Hurry, return call to discuss obtaining labs prior to refill.  Charyl Bigger, CMA

## 2016-06-17 NOTE — Patient Outreach (Signed)
[facility]) Care Management  06/17/2016  RASHON REZEK 30-Sep-1917 [id number]  Transition of care call  Referral received on 4/4, from Primary care navigator Patient discharged 4/4, Dx: Acute  Diastolic Heart failure  Placed call to patient preferred number, no answer able to leave a hipaa compliant message with my return call number and requesting a return call.  Plan Will await return call , if no response with plan call in the next day.  [name], RN, [place] Management [phone]- Mobile [phone]- Toll Free Main Office

## 2016-06-18 ENCOUNTER — Other Ambulatory Visit: Payer: Self-pay | Admitting: *Deleted

## 2016-06-18 ENCOUNTER — Encounter: Payer: Self-pay | Admitting: *Deleted

## 2016-06-18 NOTE — Patient Outreach (Signed)
[facility]) Care Management  06/18/2016  [name] 1917-10-04 [id number] Transition of care call Patient admitted 4/2 Dx : Acute Diastolic heart failure Discharge 4/4   Placed call to [name] answered the phone and identified herself as patient's daughter and HCPOA. HiPAA verified. Explained reason for the call, offered [facility] care management services.  [name] recalls conversation in hospital with [place], she verbalized agreement for services. [name] lives with [name] and is her primary caregiver.  She reports patient still complains of feeling weak and her appetite is still low.Patient usually drinks one boost with protein a day.  Patient tolerating out of bed and sitting in her recliner chair, ambulates in home using her rolling walker. [name] discussed patient is still able to bath and dress herself.  [name] has purchased scales for weighing patient daily and reports today 's weigh 105.2. Patient has denied chest pain, no swelling, reports patient is a little short of breath when walking but gets better with rest. Patient was recently discharged from hospital and all medications have been reviewed. [name] fills patient pill organizer weekly and reports patient keeps nitroglycerin in her pocket and able to let family know is she takes it.  [name] discussed patient has not   Patient has scheduled office visit with PCP on next week.  Patient has orders for [facility] services at discharge , [name] reports she as not heard from them yet,but patient has received services from [facility] in the past    Plan Will follow patient for weekly transition care outreaches, next call in a week and will schedule home visit.  Placed call to [facility] they report patient will receive a call today, updated with [name] best contact number, and notified caregiver to anticipate contact from Home health today.  Will send MD initial  barrier letter and patient welcome letter.   [name], RN, [place] Management [phone]- Mobile [phone]- Toll Free Main Office

## 2016-06-18 NOTE — Patient Outreach (Signed)
[place] [facility]) Care Management  06/18/2016  [name] Jun 07, 1917 [id number] .This encounter was created in error - please disregard.  [name], RN, [place] Management [phone]- Mobile [phone]- Toll Free Main Office

## 2016-06-21 NOTE — Telephone Encounter (Signed)
LVM for pt's daughter, [name], to return call to discuss refill request.  Charyl Bigger, CMA

## 2016-06-21 NOTE — Telephone Encounter (Signed)
Spoke with pt's daughter and she has plenty of medication to last her until 06/23/16.  Will recheck TSH at appt.  Charyl Bigger, CMA

## 2016-06-22 DIAGNOSIS — I5032 Chronic diastolic (congestive) heart failure: Secondary | ICD-10-CM | POA: Diagnosis not present

## 2016-06-22 DIAGNOSIS — R2681 Unsteadiness on feet: Secondary | ICD-10-CM | POA: Diagnosis not present

## 2016-06-22 DIAGNOSIS — I4891 Unspecified atrial fibrillation: Secondary | ICD-10-CM | POA: Diagnosis not present

## 2016-06-22 DIAGNOSIS — M6281 Muscle weakness (generalized): Secondary | ICD-10-CM | POA: Diagnosis not present

## 2016-06-22 DIAGNOSIS — Z950 Presence of cardiac pacemaker: Secondary | ICD-10-CM | POA: Diagnosis not present

## 2016-06-22 DIAGNOSIS — I110 Hypertensive heart disease with heart failure: Secondary | ICD-10-CM | POA: Diagnosis not present

## 2016-06-22 DIAGNOSIS — I25119 Atherosclerotic heart disease of native coronary artery with unspecified angina pectoris: Secondary | ICD-10-CM | POA: Diagnosis not present

## 2016-06-22 DIAGNOSIS — J449 Chronic obstructive pulmonary disease, unspecified: Secondary | ICD-10-CM | POA: Diagnosis not present

## 2016-06-23 ENCOUNTER — Ambulatory Visit (INDEPENDENT_AMBULATORY_CARE_PROVIDER_SITE_OTHER): Payer: Medicare HMO | Admitting: Adult Health

## 2016-06-23 ENCOUNTER — Encounter: Payer: Self-pay | Admitting: Adult Health

## 2016-06-23 ENCOUNTER — Telehealth: Payer: Self-pay | Admitting: Adult Health

## 2016-06-23 VITALS — BP 115/70 | HR 106 | Ht 62.0 in | Wt 109.8 lb

## 2016-06-23 DIAGNOSIS — R946 Abnormal results of thyroid function studies: Secondary | ICD-10-CM | POA: Diagnosis not present

## 2016-06-23 DIAGNOSIS — R110 Nausea: Secondary | ICD-10-CM | POA: Insufficient documentation

## 2016-06-23 DIAGNOSIS — K625 Hemorrhage of anus and rectum: Secondary | ICD-10-CM | POA: Diagnosis not present

## 2016-06-23 DIAGNOSIS — R7989 Other specified abnormal findings of blood chemistry: Secondary | ICD-10-CM | POA: Insufficient documentation

## 2016-06-23 LAB — POC HEMOCCULT BLD/STL (OFFICE/1-CARD/DIAGNOSTIC): FECAL OCCULT BLD: NEGATIVE

## 2016-06-23 LAB — POCT HEMOGLOBIN: Hemoglobin: 13.3 g/dL (ref 12.2–16.2)

## 2016-06-23 MED ORDER — ONDANSETRON 4 MG PO TBDP: 4.0000 mg | ORAL_TABLET | Freq: Three times a day (TID) | ORAL | 0 refills | Status: DC | PRN

## 2016-06-23 NOTE — Progress Notes (Signed)
Subjective:    Patient ID: [name], female    DOB: November 17, 1917, 83 y.o.   MRN: [id number]  HPI:  Ms. [name] is here fore f/u after CHF exacerbation required hospitalization last week.   "[name] a 83 y.o.femalewith history of chronic diastolic CHF, CAD status post stenting, atrial fibrillation, chronic kidney disease who was admitted 06/13/16 for evaluation of a 3 week history of increasing shortness of breath associated withorthopnea and paroxysmal nocturnal dyspnea. No reports of associatedproductive cough fever chills or chest pain". She is complaint on all medications.  She denies CP/dyspnea at rest, still experiencing SOB with exertion and fatigue.  Daily wt's have been stable, without 3 lb. Weight gain in 3 days or 5 lbs in one week.  She has had intermittent nausea since d/c, which has made it difficult for her to eat.  She also reports having a large BM yesterday with BRB and some blood clots.  She denies blood in adult brief today.  She denies abdominal pain or hematuria.  Daughter at [facility].  Patient Care Team    Relationship Specialty Notifications Start End  Odella Aquas, NP PCP - General Family Medicine  03/25/16   Sanda Klein, MD Consulting Physician Cardiology  03/25/16   Alfonzo Feller, [facility]  Admissions 06/16/16     Patient Active Problem List   Diagnosis Date Noted  . Abnormal TSH 06/23/2016  . Nausea 06/23/2016  . CHF (congestive heart failure) ([place]) 06/14/2016  . COPD (chronic obstructive pulmonary disease) ([place]) 11/29/2015  . Abdominal aortic aneurysm (AAA) without rupture ([place]) 08/21/2015  . DNR (do not resuscitate)   . Weakness generalized   . GERD (gastroesophageal reflux disease) 07/04/2015  . Dysphagia 07/04/2015  . Transaminitis 06/24/2015  . HCAP (healthcare-associated pneumonia) 06/22/2015  . Pneumonia 06/22/2015  . COPD exacerbation ([place]) 06/21/2015  . Cough 06/09/2015  . Fever, unspecified 06/09/2015  .  Chronic diastolic congestive heart failure ([place]) 04/24/2015  . Restless legs 04/24/2015  . Pain and swelling of left lower extremity 02/15/2015  . Candidiasis of perineum 02/15/2015  . Hemorrhoids 02/15/2015  . CKD (chronic kidney disease) stage 3, GFR 30-59 ml/min 02/06/2015  . Acute respiratory failure with hypoxia ([place]) 01/29/2015  . Acute pulmonary edema ([place]) 01/29/2015  . SOB (shortness of breath)   . Palliative care encounter   . Sepsis ([place]) 01/26/2015  . Cellulitis of leg, left 01/26/2015  . Protein-calorie malnutrition, severe ([place]) 11/06/2014  . Pressure ulcer 11/06/2014  . Acute diastolic CHF (congestive heart failure) ([place]) 11/04/2014  . Weakness 11/04/2014  . Hypertensive heart disease with CHF (congestive heart failure) ([place]) 11/04/2014  . Macrocytic anemia 11/04/2014  . Cardiomyopathy, ischemic 11/04/2014  . CHF exacerbation ([place]) 11/03/2014  . Body mass index (BMI) of 20.0-20.9 in adult 06/23/2014  . Hard of hearing 06/23/2014  . Rectal bleeding 07/16/2013  . Hyperlipidemia, statin intolerance 04/30/2013  . Paroxysmal atrial fibrillation ([place]) 04/30/2013  . Pacemaker - dual chamber Medtronic 2009 04/30/2013  . AAA (abdominal aortic aneurysm) ([place]) 04/30/2013  . Bilateral carotid artery stenosis 04/30/2013  . PAD (peripheral artery disease) ([place]) 04/30/2013  . Constipation 08/31/2011  . Hypotension 08/28/2011  . S/P total hip arthroplasty 08/28/2011  . CAD (coronary artery disease) 08/28/2011  . Hypothyroid 08/28/2011  . Klebsiella cystitis 08/28/2011  . Thrombocytopenia ([place]) 08/28/2011  . SSS (sick sinus syndrome) ([place]) 10/05/2007     Past Medical History:  Diagnosis Date  . Abdominal aortic aneurysm ([place])   .  Anemia    hx  . Asthma    hx  . Atrial fibrillation ([place])   . Blood transfusion   . Blood transfusion without reported diagnosis   . CAD (coronary artery disease)   . COPD (chronic obstructive pulmonary disease) ([place])   . GERD  (gastroesophageal reflux disease)   . GI bleed 2007  . H/O hiatal hernia   . Heart attack   . Heart disease   . History of right hip replacement 2013  . Hyperlipidemia   . Hypertension   . Hypothyroidism   . Myocardial infarction   . Osteoporosis   . Pacemaker   . Renal failure, chronic   . SSS (sick sinus syndrome) ([place]) 10/05/2007   Medtronic Adapta  . Stroke ([place])   . TIA (transient ischemic attack)   . UTI (lower urinary tract infection)    hx     Past Surgical History:  Procedure Laterality Date  . CARDIAC CATHETERIZATION    . COLONOSCOPY    . CORONARY STENT PLACEMENT     x9  . FRACTURE SURGERY     femur fx, /w ORIF into HIP- 2008  . JOINT REPLACEMENT    . NM MYOCAR PERF WALL MOTION  10/08/2010   mild apical anterior ischemia  . PACEMAKER INSERTION  10/05/2007   Medtronic Adapta  . TOTAL HIP ARTHROPLASTY     planned for 08/27/2011 Left  . TOTAL HIP ARTHROPLASTY  08/27/2011   Procedure: TOTAL HIP ARTHROPLASTY;  Surgeon: Kerin Salen, MD;  Location: [place];  Service: Orthopedics;  Laterality: Right;  . US ECHOCARDIOGRAPHY  08/23/2011   EF 50%,LA mod. dilated,mild to mod. mitral annular ca+,mod. MR,mild to mod TR,mild to mod. PH,trace AI.     Family History  Problem Relation Age of Onset  . Emphysema Father   . Heart disease Mother   . Cancer Brother   . Cancer Brother   . Cancer Sister   . Cancer Sister   . Diabetes Daughter   . Atrial fibrillation Daughter   . Atrial fibrillation Daughter   . Stroke Daughter   . Heart Problems Son      History  Drug Use No     History  Alcohol Use No     History  Smoking Status  . Former Smoker  . Types: Cigarettes  Smokeless Tobacco  . Never Used    Comment: quit 40 yrs ago- was smoking 2 packs a day at the most     Outpatient Encounter Prescriptions as of 06/23/2016  Medication Sig  . acetaminophen (TYLENOL) 500 MG tablet Take 1 tablet (500 mg total) by mouth at bedtime.  [name] amiodarone (PACERONE) 200 MG  tablet Take 1 tablet (200 mg total) by mouth daily.  . Biotin 1000 MCG tablet Take 1 tablet (1 mg total) by mouth daily.  . Calcium Carbonate-Vitamin D (CALCIUM 600+D) 600-400 MG-UNIT tablet Take 1 tablet by mouth daily.  . carboxymethylcellulose 1 % ophthalmic solution Apply 1 drop to eye 3 (three) times daily. (Patient taking differently: Apply 1 drop to eye 3 (three) times daily as needed (dry eyes). )  . clopidogrel (PLAVIX) 75 MG tablet TAKE 1 TABLET (75 MG TOTAL) BY MOUTH DAILY.  . cycloSPORINE (RESTASIS) 0.05 % ophthalmic emulsion Place 1 drop into both eyes 2 (two) times daily as needed (dryness).   . Docosanol (ABREVA) 10 % CREA Apply 1 application topically 5 (five) times daily. Apply to affected area on lip  5 x day until healed  .  feeding supplement (BOOST HIGH PROTEIN) LIQD Take 1 Container by mouth See admin instructions. Drink 1 everyday and another as needed for nutrition supplement.  . furosemide (LASIX) 20 MG tablet Take 2 tablets (40 mg total) by mouth daily.  [name] KLOR-CON M10 10 MEQ tablet Take 1 tablet (10 mEq total) by mouth 2 (two) times daily. (Patient taking differently: Take 10 mEq by mouth daily. )  . levothyroxine (SYNTHROID, LEVOTHROID) 125 MCG tablet Take 1 tablet (125 mcg total) by mouth daily before breakfast.  . nitroGLYCERIN (NITROSTAT) 0.4 MG SL tablet Place 1 tablet (0.4 mg total) under the tongue every 5 (five) minutes as needed for chest pain (For a total of 3 tablets, if chest pain persist to call 911).  . ranitidine (ZANTAC) 300 MG capsule Take 1 capsule (300 mg total) by mouth every evening.  [name] rOPINIRole (REQUIP) 0.25 MG tablet Take 1 tablet (0.25 mg total) by mouth at bedtime.  . Vitamin D, Ergocalciferol, (DRISDOL) 50000 units CAPS capsule Take 1 capsule (50,000 Units total) by mouth every 7 (seven) days.  . [DISCONTINUED] ondansetron (ZOFRAN-ODT) 4 MG disintegrating tablet Take 1 tablet (4 mg total) by mouth every 8 (eight) hours as needed for nausea or  vomiting.   No facility-administered encounter medications on file as of 06/23/2016.     Allergies: Nubain [nalbuphine hcl]; Mirtazapine; Pineapple; Statins; and Iodine  Body mass index is 20.07 kg/m.  Blood pressure 115/70, pulse (!) 106, height 5\' 2"  (1.575 m), weight 109 lb 12 oz (49.8 kg). Home weight without clothes 105 lb    Review of Systems  Constitutional: Positive for activity change, appetite change, diaphoresis and fatigue. Negative for chills, fever and unexpected weight change.  HENT: Negative for congestion.   Eyes: Negative for visual disturbance.  Respiratory: Positive for shortness of breath. Negative for cough, chest tightness, wheezing and stridor.        SOB with exertion.  Cardiovascular: Positive for leg swelling. Negative for chest pain and palpitations.  Gastrointestinal: Positive for blood in stool. Negative for abdominal distention, abdominal pain, anal bleeding, constipation, diarrhea, rectal pain and vomiting.  Endocrine: Positive for cold intolerance. Negative for heat intolerance, polydipsia, polyphagia and polyuria.  Genitourinary: Negative for difficulty urinating and flank pain.  Musculoskeletal: Positive for arthralgias, back pain, gait problem, joint swelling, myalgias, neck pain and neck stiffness.  Skin: Negative for color change, pallor, rash and wound.  Neurological: Positive for weakness. Negative for dizziness, tremors, light-headedness and headaches.  Hematological: Bruises/bleeds easily.  Psychiatric/Behavioral: Negative for dysphoric mood, self-injury, sleep disturbance and suicidal ideas. The patient is not nervous/anxious.        Objective:   Physical Exam  Constitutional: She is oriented to person, place, and time. She appears well-developed and well-nourished. No distress.  HENT:  Head: Normocephalic and atraumatic.  Cardiovascular: Normal rate, regular rhythm, normal heart sounds and intact distal pulses.   Pulmonary/Chest:  Effort normal and breath sounds normal. No respiratory distress. She has no wheezes. She has no rales. She exhibits no tenderness.  Abdominal: Soft. Bowel sounds are normal. She exhibits no mass. There is no tenderness. There is no rebound and no guarding.  Genitourinary: Rectal exam shows guaiac negative stool.  Musculoskeletal:       Right ankle: She exhibits swelling.       Left ankle: She exhibits swelling.  Lymphadenopathy:    She has no cervical adenopathy.  Neurological: She is alert and oriented to person, place, and time.  Skin: Skin is warm  and dry. No rash noted. She is not diaphoretic. No erythema. No pallor.  Psychiatric: She has a normal mood and affect. Her behavior is normal. Judgment and thought content normal.          Assessment & Plan:   1. Rectal bleeding   2. Abnormal TSH   3. Nausea     Rectal bleeding H/H stable Fecal Occult Negative   Nausea  Continue to sip fluids and eat small, easily digestable meals. If nausea does not improve in one week we will place GI referral.   Abnormal TSH Once TSH level results we will call you and adjust medication as necessary.    FOLLOW-UP:  Return if symptoms worsen or fail to improve.

## 2016-06-23 NOTE — Patient Instructions (Addendum)
Heart Failure Heart failure is a condition in which the heart has trouble pumping blood because it has become weak or stiff. This means that the heart does not pump blood efficiently for the body to work well. For some people with heart failure, fluid may back up into the lungs and there may be swelling (edema) in the lower legs. Heart failure is usually a long-term (chronic) condition. It is important for you to take good care of yourself and follow the treatment plan from your health care provider. What are the causes? This condition is caused by some health problems, including:  High blood pressure (hypertension). Hypertension causes the heart muscle to work harder than normal. High blood pressure eventually causes the heart to become stiff and weak.  Coronary artery disease (CAD). CAD is the buildup of cholesterol and fat (plaques) in the arteries of the heart.  Heart attack (myocardial infarction). Injured tissue, which is caused by the heart attack, does not contract as well and the heart's ability to pump blood is weakened.  Abnormal heart valves. When the heart valves do not open and close properly, the heart muscle must pump harder to keep the blood flowing.  Heart muscle disease (cardiomyopathy or myocarditis). Heart muscle disease is damage to the heart muscle from a variety of causes, such as drug or alcohol abuse, infections, or unknown causes. These can increase the risk of heart failure.  Lung disease. When the lungs do not work properly, the heart must work harder. What increases the risk? Risk of heart failure increases as a person ages. This condition is also more likely to develop in people who:  Are overweight.  Are female.  Smoke or chew tobacco.  Abuse alcohol or illegal drugs.  Have taken medicines that can damage the heart, such as chemotherapy drugs.  Have diabetes.  High blood sugar (glucose) is associated with high fat (lipid) levels in the blood.  Diabetes  can also damage tiny blood vessels that carry nutrients to the heart muscle.  Have abnormal heart rhythms.  Have thyroid problems.  Have low blood counts (anemia). What are the signs or symptoms? Symptoms of this condition include:  Shortness of breath with activity, such as when climbing stairs.  Persistent cough.  Swelling of the feet, ankles, legs, or abdomen.  Unexplained weight gain.  Difficulty breathing when lying flat (orthopnea).  Waking from sleep because of the need to sit up and get more air.  Rapid heartbeat.  Fatigue and loss of energy.  Feeling light-headed, dizzy, or close to fainting.  Loss of appetite.  Nausea.  Increased urination during the night (nocturia).  Confusion. How is this diagnosed? This condition is diagnosed based on:  Medical history, symptoms, and a physical exam.  Diagnostic tests, which may include:  Echocardiogram.  Electrocardiogram (ECG).  Chest X-ray.  Blood tests.  Exercise stress test.  Radionuclide scans.  Cardiac catheterization and angiogram. How is this treated? Treatment for this condition is aimed at managing the symptoms of heart failure. Medicines, behavioral changes, or other treatments may be necessary to treat heart failure. Medicines  These may include:  Angiotensin-converting enzyme (ACE) inhibitors. This type of medicine blocks the effects of a blood protein called angiotensin-converting enzyme. ACE inhibitors relax (dilate) the blood vessels and help to lower blood pressure.  Angiotensin receptor blockers (ARBs). This type of medicine blocks the actions of a blood protein called angiotensin. ARBs dilate the blood vessels and help to lower blood pressure.  Water pills (diuretics). Diuretics  pills (diuretics). Diuretics cause the kidneys to remove salt and water from the blood. The extra fluid is removed through urination, leaving a lower volume of blood that the heart has to pump. °· Beta blockers. These improve heart  muscle strength and they prevent the heart from beating too quickly. °· Digoxin. This increases the force of the heartbeat. ° °Healthy behavior changes °These may include: °· Reaching and maintaining a healthy weight. °· Stopping smoking or chewing tobacco. °· Eating heart-healthy foods. °· Limiting or avoiding alcohol. °· Stopping use of street drugs (illegal drugs). °· Physical activity. ° °Other treatments °These may include: °· Surgery to open blocked coronary arteries or repair damaged heart valves. °· Placement of a biventricular pacemaker to improve heart muscle function (cardiac resynchronization therapy). This device paces both the right ventricle and left ventricle. °· Placement of a device to treat serious abnormal heart rhythms (implantable cardioverter defibrillator, or ICD). °· Placement of a device to improve the pumping ability of the heart (left ventricular assist device, or LVAD). °· Heart transplant. This can cure heart failure, and it is considered for certain patients who do not improve with other therapies. ° °Follow these instructions at home: °Medicines °· Take over-the-counter and prescription medicines only as told by your health care provider. Medicines are important in reducing the workload of your heart, slowing the progression of heart failure, and improving your symptoms. °? Do not stop taking your medicine unless your health care provider told you to do that. °? Do not skip any dose of medicine. °? Refill your prescriptions before you run out of medicine. You need your medicines every day. °Eating and drinking ° °· Eat heart-healthy foods. Talk with a dietitian to make an eating plan that is right for you. °? Choose foods that contain no trans fat and are low in saturated fat and cholesterol. Healthy choices include fresh or frozen fruits and vegetables, fish, lean meats, legumes, fat-free or low-fat dairy products, and whole-grain or high-fiber foods. °? Limit salt (sodium) if  directed by your health care provider. Sodium restriction may reduce symptoms of heart failure. Ask a dietitian to recommend heart-healthy seasonings. °? Use healthy cooking methods instead of frying. Healthy methods include roasting, grilling, broiling, baking, poaching, steaming, and stir-frying. °· Limit your fluid intake if directed by your health care provider. Fluid restriction may reduce symptoms of heart failure. °Lifestyle °· Stop smoking or using chewing tobacco. Nicotine and tobacco can damage your heart and your blood vessels. Do not use nicotine gum or patches before talking to your health care provider. °· Limit alcohol intake to no more than 1 drink per day for non-pregnant women and 2 drinks per day for men. One drink equals 12 oz of beer, 5 oz of wine, or 1½ oz of hard liquor. °? Drinking more than that is harmful to your heart. Tell your health care provider if you drink alcohol several times a week. °? Talk with your health care provider about whether any level of alcohol use is safe for you. °? If your heart has already been damaged by alcohol or you have severe heart failure, drinking alcohol should be stopped completely. °· Stop use of illegal drugs. °· Lose weight if directed by your health care provider. Weight loss may reduce symptoms of heart failure. °· Do moderate physical activity if directed by your health care provider. People who are elderly and people with severe heart failure should consult with a health care provider for physical activity recommendations. °  important information   Weigh yourself every day. Keeping track of your weight daily helps you to notice excess fluid sooner.  Weigh yourself every morning after you urinate and before you eat breakfast.  Wear the same amount of clothing each time you weigh yourself.  Record your daily weight. Provide your health care provider with your weight record.  Monitor and record your blood pressure as told by your health care  provider.  Check your pulse as told by your health care provider. Dealing with extreme temperatures   If the weather is extremely hot:  Avoid vigorous physical activity.  Use air conditioning or fans or seek a cooler location.  Avoid caffeine and alcohol.  Wear loose-fitting, lightweight, and light-colored clothing.  If the weather is extremely cold:  Avoid vigorous physical activity.  Layer your clothes.  Wear mittens or gloves, a hat, and a scarf when you go outside.  Avoid alcohol. General instructions   Manage other health conditions such as hypertension, diabetes, thyroid disease, or abnormal heart rhythms as told by your health care provider.  Learn to manage stress. If you need help to do this, ask your health care provider.  Plan rest periods when fatigued.  Get ongoing education and support as needed.  Participate in or seek rehabilitation as needed to maintain or improve independence and quality of life.  Stay up to date with immunizations. Keeping current on pneumococcal and influenza immunizations is especially important to prevent respiratory infections.  Keep all follow-up visits as told by your health care provider. This is important. Contact a health care provider if:  You have a rapid weight gain.  You have increasing shortness of breath that is unusual for you.  You are unable to participate in your usual physical activities.  You tire easily.  You cough more than normal, especially with physical activity.  You have any swelling or more swelling in areas such as your hands, feet, ankles, or abdomen.  You are unable to sleep because it is hard to breathe.  You feel like your heart is beating quickly (palpitations).  You become dizzy or light-headed when you stand up. Get help right away if:  You have difficulty breathing.  You notice or your family notices a change in your awareness, such as having trouble staying awake or having  difficulty with concentration.  You have pain or discomfort in your chest.  You have an episode of fainting (syncope). This information is not intended to replace advice given to you by your health care provider. Make sure you discuss any questions you have with your health care provider. Document Released: 03/01/2005 Document Revised: 11/04/2015 Document Reviewed: 09/24/2015 Elsevier Interactive Patient Education  2017 [employer].  Please continue all medications as directed. Please keep Cardiology appt. tomorrow. If nausea does not improve in 1 week, please call us and we will refer you to GI. Once TSH level results we will call you and adjust medication as necessary.

## 2016-06-23 NOTE — Telephone Encounter (Signed)
Please advise.  T. Trystyn Dolley, CMA 

## 2016-06-23 NOTE — Telephone Encounter (Signed)
Good Afternoon Tonya, Can you please call Ms. Mohamed and share that she can take tablets of the Requip 0.25mg  (for a total of 0.5mg , which is well underneath max dosage). Please tell her to call us with any questions/concerns. Thanks! Valetta Fuller

## 2016-06-23 NOTE — Telephone Encounter (Signed)
LVM for pt's daughter, [name], to return call to discuss.  Charyl Bigger, CMA

## 2016-06-23 NOTE — Assessment & Plan Note (Signed)
Once TSH level results we will call you and adjust medication as necessary.

## 2016-06-23 NOTE — Assessment & Plan Note (Signed)
H/H stable Fecal Occult Negative

## 2016-06-23 NOTE — Telephone Encounter (Signed)
Daughter called saying forgot to ask a question during today's appt. Patient takes Requip at night for her legs but it still having difficulties managing it and wants to know if the dosage can be increased.

## 2016-06-23 NOTE — Assessment & Plan Note (Addendum)
  Continue to sip fluids and eat small, easily digestable meals. If nausea does not improve in one week we will place GI referral.

## 2016-06-24 ENCOUNTER — Other Ambulatory Visit: Payer: Self-pay | Admitting: *Deleted

## 2016-06-24 ENCOUNTER — Other Ambulatory Visit: Payer: Self-pay | Admitting: Adult Health

## 2016-06-24 ENCOUNTER — Ambulatory Visit (INDEPENDENT_AMBULATORY_CARE_PROVIDER_SITE_OTHER): Payer: Medicare HMO | Admitting: Cardiovascular Disease

## 2016-06-24 ENCOUNTER — Encounter: Payer: Self-pay | Admitting: Cardiovascular Disease

## 2016-06-24 VITALS — BP 108/73 | HR 98 | Ht 62.0 in | Wt 110.0 lb

## 2016-06-24 DIAGNOSIS — I495 Sick sinus syndrome: Secondary | ICD-10-CM | POA: Diagnosis not present

## 2016-06-24 DIAGNOSIS — I950 Idiopathic hypotension: Secondary | ICD-10-CM | POA: Diagnosis not present

## 2016-06-24 DIAGNOSIS — I4819 Other persistent atrial fibrillation: Secondary | ICD-10-CM

## 2016-06-24 DIAGNOSIS — I25118 Atherosclerotic heart disease of native coronary artery with other forms of angina pectoris: Secondary | ICD-10-CM

## 2016-06-24 DIAGNOSIS — I714 Abdominal aortic aneurysm, without rupture, unspecified: Secondary | ICD-10-CM

## 2016-06-24 DIAGNOSIS — Z950 Presence of cardiac pacemaker: Secondary | ICD-10-CM

## 2016-06-24 DIAGNOSIS — E039 Hypothyroidism, unspecified: Secondary | ICD-10-CM

## 2016-06-24 DIAGNOSIS — I5033 Acute on chronic diastolic (congestive) heart failure: Secondary | ICD-10-CM

## 2016-06-24 DIAGNOSIS — I481 Persistent atrial fibrillation: Secondary | ICD-10-CM

## 2016-06-24 LAB — TSH: TSH: 0.26 u[IU]/mL — ABNORMAL LOW (ref 0.450–4.500)

## 2016-06-24 MED ORDER — ISOSORBIDE MONONITRATE ER 30 MG PO TB24
30.0000 mg | ORAL_TABLET | Freq: Every day | ORAL | 3 refills | Status: AC
Start: 2016-06-24 — End: 2016-10-21

## 2016-06-24 MED ORDER — LEVOTHYROXINE SODIUM 137 MCG PO TABS: 137.0000 ug | ORAL_TABLET | Freq: Every day | ORAL | 1 refills | Status: DC

## 2016-06-24 MED ORDER — LEVOTHYROXINE SODIUM 112 MCG PO TABS: 112.0000 ug | ORAL_TABLET | Freq: Every day | ORAL | 0 refills | Status: DC

## 2016-06-24 NOTE — Progress Notes (Signed)
LVM for pt's daughter with new RX and instructions.  Contacted [facility] pharmacy and cancelled RX for levothyroxine 117mcg.  Charyl Bigger, CMA

## 2016-06-24 NOTE — Progress Notes (Signed)
Patient ID: [name], female   DOB: 08/27/1917, 83 y.o.   MRN: [id number]    Cardiology Office Note    Date:  06/25/2016   ID:  [name], DOB May 22, 1917, MRN [id number]  PCP:  [name], NP  Cardiologist:   Sanda Klein, MD   Chief Complaint  Patient presents with  . Follow-up    post hospital , sob, arm pain     History of Present Illness:  [name] is a 83 y.o. female with coronary artery disease, history of paroxysmal atrial fibrillation, previous TIA, sinus node dysfunction and a dual-chamber pacemaker, diastolic heart failure, COPD, hypertension and hypothyroidism returning for follow-up.  She was hospitalized in April with diastolic heart failure says her ablation; with diuretics she lost roughly 7 pounds by the time of discharge (discharge weight 105 pounds, today 107 pounds on home scale which seems to better match the hospital weight, 110 pounds on our office scale) and felt better. She remains weak, elderly and frail. She is very hard of hearing and most of the history is obtained via her daughter.  Describes frequent episodes of aching in her right shoulder. These reportedly respond promptly to sublingual nitroglycerin. She has taken about 5 or 6 nitroglycerin tablets since discharge home a week ago  Her heart rhythm is irregular today, assistant with atrial fibrillation. ECG on April 2 showed atrial fibrillation with mild rapid ventricular response.  CAD: In 1997 she underwent urgent stenting of the proximal LAD artery for NSTEMI and then underwent a staged stent to the left circumflex coronary artery, complicated by frequent nonsustained ventricular tachycardia. A planned staged intervention to the right coronary artery was performed later the same year.  She required repeat stenting of all 3 coronary arteries in 2006, when she presented with unstable angina. These procedures were performed on 3 consecutive days and a rather complicated fashion. Drug-eluting  stents were placed in the proximal LAD and proximal RCA, with bare-metal stents in the proximal left circumflex and mid LAD. There was residual 50% stenosis of the left main coronary artery, but she has not required any revascularization procedure since.  Her last nuclear stress test performed in 2012 and showed low risk findings.   CHF: she bears a diagnosis of diastolic heart failure and takes a very low dose of loop diuretic. Attempts to discontinue her furosemide completely led to marked edema and it was restarted.  PAD: She has an infrarenal abdominal aortic aneurysm has been recognized for 18 years without much progression. Most recent ultrasound in May of 2015 measured at 3.8x3.8 cm. Has received a previous stent to the right common iliac artery and has mild bilateral stenoses in both iliac arteries by her most recent ultrasound performed in 2014. She does not have intermittent claudication. Bilateral ABIs were greater than 1.0. Stable <50% bilateral internal carotid artery lesions, also unchanged for several years.   Arrhythmia: She has a history of sinus node dysfunction and paroxysmal atrial fibrillation and received a dual-chamber Medtronic adapta pacemaker in 2009. She had 2 weeks of persistent atrial fibrillation in April 2017 during an episode of pneumonia.. Atrial pacing occurs virtually 100% of the time. Ventricular pacing occurs very infrequently. She failed multaq antiarrhythmic therapy and is now receiving amiodarone in a low dose.   Risk factors: She has been intolerant to numerous statins including pravastatin, atorvastatin, simvastatin and daily or weekly Crestor.   Significant comorbidities also include treated hypothyroidism. She does not have diabetes mellitus and quit  smoking decades ago. She has had multiple repeat surgeries for her left hip with the most recent revision being in June of 2013. She uses a walker and occasionally gets around with a cane.   Past Medical  History:  Diagnosis Date  . Abdominal aortic aneurysm ([place])   . Anemia    hx  . Asthma    hx  . Atrial fibrillation ([place])   . Blood transfusion   . Blood transfusion without reported diagnosis   . CAD (coronary artery disease)   . COPD (chronic obstructive pulmonary disease) ([place])   . GERD (gastroesophageal reflux disease)   . GI bleed 2007  . H/O hiatal hernia   . Heart attack   . Heart disease   . History of right hip replacement 2013  . Hyperlipidemia   . Hypertension   . Hypothyroidism   . Myocardial infarction   . Osteoporosis   . Pacemaker   . Renal failure, chronic   . SSS (sick sinus syndrome) ([place]) 10/05/2007   Medtronic Adapta  . Stroke ([place])   . TIA (transient ischemic attack)   . UTI (lower urinary tract infection)    hx    Past Surgical History:  Procedure Laterality Date  . CARDIAC CATHETERIZATION    . COLONOSCOPY    . CORONARY STENT PLACEMENT     x9  . FRACTURE SURGERY     femur fx, /w ORIF into HIP- 2008  . JOINT REPLACEMENT    . NM MYOCAR PERF WALL MOTION  10/08/2010   mild apical anterior ischemia  . PACEMAKER INSERTION  10/05/2007   Medtronic Adapta  . TOTAL HIP ARTHROPLASTY     planned for 08/27/2011 Left  . TOTAL HIP ARTHROPLASTY  08/27/2011   Procedure: TOTAL HIP ARTHROPLASTY;  Surgeon: Kerin Salen, MD;  Location: [facility];  Service: Orthopedics;  Laterality: Right;  . US ECHOCARDIOGRAPHY  08/23/2011   EF 50%,LA mod. dilated,mild to mod. mitral annular ca+,mod. MR,mild to mod TR,mild to mod. PH,trace AI.    Current Medications: Outpatient Medications Prior to Visit  Medication Sig Dispense Refill  . acetaminophen (TYLENOL) 500 MG tablet Take 1 tablet (500 mg total) by mouth at bedtime. 30 tablet 2  . amiodarone (PACERONE) 200 MG tablet Take 1 tablet (200 mg total) by mouth daily. 90 tablet 1  . Biotin 1000 MCG tablet Take 1 tablet (1 mg total) by mouth daily. 90 tablet 2  . Calcium Carbonate-Vitamin D (CALCIUM 600+D) 600-400 MG-UNIT tablet  Take 1 tablet by mouth daily. 90 tablet 2  . carboxymethylcellulose 1 % ophthalmic solution Apply 1 drop to eye 3 (three) times daily. (Patient taking differently: Apply 1 drop to eye 3 (three) times daily as needed (dry eyes). ) 30 mL 2  . clopidogrel (PLAVIX) 75 MG tablet TAKE 1 TABLET (75 MG TOTAL) BY MOUTH DAILY. 90 tablet 1  . cycloSPORINE (RESTASIS) 0.05 % ophthalmic emulsion Place 1 drop into both eyes 2 (two) times daily as needed (dryness).     . Docosanol (ABREVA) 10 % CREA Apply 1 application topically 5 (five) times daily. Apply to affected area on lip  5 x day until healed 1 Tube 2  . feeding supplement (BOOST HIGH PROTEIN) LIQD Take 1 Container by mouth See admin instructions. Drink 1 everyday and another as needed for nutrition supplement.    . furosemide (LASIX) 20 MG tablet Take 2 tablets (40 mg total) by mouth daily. 60 tablet 3  . KLOR-CON M10 10 MEQ tablet  Take 1 tablet (10 mEq total) by mouth 2 (two) times daily. (Patient taking differently: Take 10 mEq by mouth daily. ) 60 tablet 0  . nitroGLYCERIN (NITROSTAT) 0.4 MG SL tablet Place 1 tablet (0.4 mg total) under the tongue every 5 (five) minutes as needed for chest pain (For a total of 3 tablets, if chest pain persist to call 911). 50 tablet 3  . ranitidine (ZANTAC) 300 MG capsule Take 1 capsule (300 mg total) by mouth every evening. 90 capsule 1  . rOPINIRole (REQUIP) 0.25 MG tablet Take 1 tablet (0.25 mg total) by mouth at bedtime. 90 tablet 1  . Vitamin D, Ergocalciferol, (DRISDOL) 50000 units CAPS capsule Take 1 capsule (50,000 Units total) by mouth every 7 (seven) days. 12 capsule 0  . levothyroxine (SYNTHROID, LEVOTHROID) 125 MCG tablet Take 1 tablet (125 mcg total) by mouth daily before breakfast. 30 tablet 1   No facility-administered medications prior to visit.      Allergies:   Nubain [nalbuphine hcl]; Pineapple; Iodine; Mirtazapine; and Statins   Social History   Social History  . Marital status: Widowed     Spouse name: N/A  . Number of children: 4  . Years of education: N/A   Occupational History  . Retired    Social History Main Topics  . Smoking status: Former Smoker    Types: Cigarettes  . Smokeless tobacco: Never Used     Comment: quit 40 yrs ago- was smoking 2 packs a day at the most  . Alcohol use No  . Drug use: No  . Sexual activity: Yes    Birth control/ protection: Post-menopausal   Other Topics Concern  . None   Social History Narrative   Diet:      Do you drink/ eat things with caffeine? yes      Marital status:    widow                           What year were you married ?  1940      Do you live in a house, apartment,assistred living, condo, trailer, etc.)?  Mobile home      Is it one or more stories? 1      How many persons live in your home ?  1      Do you have any pets in your home ?(please list)  1 cat      Current or past profession: factory worker      Do you exercise?  yes                           Type & how often: small amount      Do you have a living will?  no      Do you have a DNR form?     no                  If not, do you want to discuss one?       Do you have signed POA?HPOA forms?  no               If so, please bring to your        appointment           Family History:  The patient's family history includes Atrial fibrillation in her daughter and daughter; Cancer in her brother, brother, sister, and  sister; Diabetes in her daughter; Emphysema in her father; Heart Problems in her son; Heart disease in her mother; Stroke in her daughter.   ROS:   Please see the history of present illness.    ROS All other systems reviewed and are negative.   PHYSICAL EXAM:   VS:  BP 108/73 (BP Location: Left Arm, Patient Position: Sitting, Cuff Size: Normal)   Pulse 98   Ht 5\' 2"  (1.575 m)   Wt 49.9 kg (110 lb)   BMI 20.12 kg/m    GEN: Cachectic, well developed, in no acute distress . Prominent thoracic kyphosis  HEENT: normal  Neck: no  JVD, carotid bruits, or masses Cardiac: Irregularly irregular rhythm; no murmurs, rubs, or gallops,no edema , healthy appearing pacemaker site Respiratory:  Diminished breath sounds throughout but otherwise clear to auscultation bilaterally, normal work of breathing GI: soft, nontender, nondistended, + BS MS: no deformity or atrophy  Skin: warm and dry, no rash Neuro:  Alert and Oriented x 3, Strength and sensation are intact. She is extremely hard of hearing Psych: euthymic mood, full affect  Wt Readings from Last 3 Encounters:  06/24/16 49.9 kg (110 lb)  06/23/16 49.8 kg (109 lb 12 oz)  06/16/16 47.8 kg (105 lb 6.4 oz)      Studies/Labs Reviewed:   EKG:  EKG is not ordered today.  The ekg ordered 06/14/2016 demonstrates AFib with RVR, nonspecific intraventricular conduction delay   Recent Labs: 06/13/2016: B Natriuretic Peptide 903.0 06/14/2016: ALT 25; Magnesium 1.7; Platelets 129 06/16/2016: BUN 23; Creatinine, Ser 1.30; Potassium 4.3; Sodium 140 06/23/2016: Hemoglobin 13.3; TSH 0.260   Lipid Panel    Component Value Date/Time   CHOL 157 04/26/2016 1001   TRIG 109 04/26/2016 1001   HDL 48 04/26/2016 1001   CHOLHDL 3.3 04/26/2016 1001   CHOLHDL 3.3 12/01/2012 1035   VLDL 24 12/01/2012 1035   LDLCALC 87 04/26/2016 1001    Additional studies/ records that were reviewed today include:  Records from recent hospitalization  ASSESSMENT:    1. Acute on chronic diastolic congestive heart failure ([place])   2. Coronary artery disease involving native coronary artery of native heart with other form of angina pectoris ([place])   3. SSS (sick sinus syndrome) ([facility])   4. Persistent atrial fibrillation ([place])   5. Pacemaker   6. Abdominal aortic aneurysm (AAA) without rupture ([place])   7. Idiopathic hypotension      PLAN:  In order of problems listed above:  1. CHF: Appears clinically euvolemic on low dose of furosemide. No changes planned. Continue daily weights and call us if her weight  reaches 110 pounds, when we would recommend an increase in her diuretic dose 2. CAD: Complicated history of multiple percutaneous revascularization procedures and residual stenosis of the left main coronary artery. Her right shoulder pain is probably her angina equivalent. We'll place on long-acting nitrates. Unfortunately, she is statin intolerant.  3. SSS: She is very sedentary. Current pacemaker settings appear to be appropriate at last device check. Currently in atrial fibrillation. 4. AFib: She had a lengthy episode of atrial fibrillation following pneumonia last year, now is back in persistent atrial fibrillation, otherwise she has had very good response to amiodarone with suppression of the arrhythmia for a long time. Normal thyroid function. Liver tests were generally normal during her hospitalization (minimal elevation in AST with normal ALT). CHADSVasc 8 (age 79, TIA 2, HTN, CAD, GHF, gender). Her risk of embolic events is high , as is her  risk of bleeding complications. This has previously been thought out and discussed with the patient and her family. She is on clopidogrel and is not felt to be a good candidate for warfarin or a novel anticoagulant.  5. PPM: Normal pacemaker function. Anticipate change out in the next several months. She is not pacemaker dependent. Remote download in 3 months and office visit in 6 months.ed 6. AAA (abdominal aortic aneurysm): Asymptomatic, stable in size at about 3.8 cm - Since the aneurysm is small and I doubt she will ever be a surgical or percutaneous repair candidate, routine monitoring does not appear indicated anymore. 21 described this to her daughter a year ago she agreed, but later she called back and wanted [place] to repeat the abdominal ultrasound.. 7. Blood pressure is not a problem today. Blood pressure may limit the use of antianginal medications.  I believe Mrs. Reppond's care should focus primarily on symptom relief. She is almost 83 years old and her  quality of life and functional status are poor. Her daughter verbally agrees to this, but often makes requests that make me doubt that we are always on the same wavelength. Will continue to have these discussions with her, especially since we will soon be coming up on need for pacemaker generator change out. She is not device dependent and is not unreasonable to avoid another surgical procedure on this elderly lady.    Medication Adjustments/Labs and Tests Ordered: Current medicines are reviewed at length with the patient today.  Concerns regarding medicines are outlined above.  Medication changes, Labs and Tests ordered today are listed in the Patient Instructions below. Patient Instructions  Dr Sallyanne Kuster has recommended making the following medication changes: 1. START Isosorbide 30 mg - take 1 tablet by mouth daily  Your physician recommends that you schedule a follow-up appointment in 6 months. You will receive a reminder letter in the mail two months in advance. If you don't receive a letter, please call our office to schedule the follow-up appointment.  If you need a refill on your cardiac medications before your next appointment, please call your pharmacy.    Signed, Sanda Klein, MD  06/25/2016 4:25 PM    [facility] [address], [place], [place]  [address] Phone: [phone]; Fax: [phone]

## 2016-06-24 NOTE — Progress Notes (Unsigned)
Inadvertently increased Levothyroxine, when I meant to decrease dose. Please cxl 127mcg dose, she needs to be one the 153mcg dose. When we will re-check TSH in TSH 6-8 weeks. Thanks! [name]

## 2016-06-24 NOTE — Telephone Encounter (Signed)
Pt's daughter informed of results.  Daughter expressed understanding and is agreeable.  Charyl Bigger, CMA

## 2016-06-24 NOTE — Patient Instructions (Signed)
Dr Sallyanne Kuster has recommended making the following medication changes: 1. START Isosorbide 30 mg - take 1 tablet by mouth daily  Your physician recommends that you schedule a follow-up appointment in 6 months. You will receive a reminder letter in the mail two months in advance. If you don't receive a letter, please call our office to schedule the follow-up appointment.  If you need a refill on your cardiac medications before your next appointment, please call your pharmacy.

## 2016-06-24 NOTE — Patient Outreach (Signed)
[facility]) Care Management  06/24/2016  [name] 12/12/1917 [id number]   Transition of care call   1320  Placed call as part of transition of care follow up to [name] , caregiver, no answer able to leave a hipaa compliant message requesting a return call. Noted patient had MD appointment this am, will attempt call later today.    [place] with [name] regarding [name], she discussed recent MD visit and medication changes.  Patient still very weak per family and gets tired and short of breath when walking short distances. Home health is assisting with getting wheel chair.  Patient weight at home today was 107. 2, she discussed she still has some swelling in her legs and ankles continues to take her lasix as prescribed and states swelling in no worse.  Patient does have a blood pressure monitor at home , but [name] wants to check into against the reading when we check patient blood pressure at home   Plan Will schedule initial transition of care home visit in next week.   [name], RN, [facility] [phone]- Mobile [phone]- Toll Free Main Office

## 2016-06-28 ENCOUNTER — Other Ambulatory Visit: Payer: Self-pay

## 2016-06-28 DIAGNOSIS — Z950 Presence of cardiac pacemaker: Secondary | ICD-10-CM | POA: Diagnosis not present

## 2016-06-28 DIAGNOSIS — I5032 Chronic diastolic (congestive) heart failure: Secondary | ICD-10-CM | POA: Diagnosis not present

## 2016-06-28 DIAGNOSIS — I110 Hypertensive heart disease with heart failure: Secondary | ICD-10-CM | POA: Diagnosis not present

## 2016-06-28 DIAGNOSIS — I4891 Unspecified atrial fibrillation: Secondary | ICD-10-CM | POA: Diagnosis not present

## 2016-06-28 DIAGNOSIS — M6281 Muscle weakness (generalized): Secondary | ICD-10-CM | POA: Diagnosis not present

## 2016-06-28 DIAGNOSIS — R2681 Unsteadiness on feet: Secondary | ICD-10-CM | POA: Diagnosis not present

## 2016-06-28 DIAGNOSIS — J449 Chronic obstructive pulmonary disease, unspecified: Secondary | ICD-10-CM | POA: Diagnosis not present

## 2016-06-28 DIAGNOSIS — I25119 Atherosclerotic heart disease of native coronary artery with unspecified angina pectoris: Secondary | ICD-10-CM | POA: Diagnosis not present

## 2016-06-28 MED ORDER — KLOR-CON M10 10 MEQ PO TBCR: 10.0000 meq | EXTENDED_RELEASE_TABLET | Freq: Two times a day (BID) | ORAL | 1 refills | Status: DC

## 2016-06-30 DIAGNOSIS — M6281 Muscle weakness (generalized): Secondary | ICD-10-CM | POA: Diagnosis not present

## 2016-06-30 DIAGNOSIS — I4891 Unspecified atrial fibrillation: Secondary | ICD-10-CM | POA: Diagnosis not present

## 2016-06-30 DIAGNOSIS — I25119 Atherosclerotic heart disease of native coronary artery with unspecified angina pectoris: Secondary | ICD-10-CM | POA: Diagnosis not present

## 2016-06-30 DIAGNOSIS — I110 Hypertensive heart disease with heart failure: Secondary | ICD-10-CM | POA: Diagnosis not present

## 2016-06-30 DIAGNOSIS — R2681 Unsteadiness on feet: Secondary | ICD-10-CM | POA: Diagnosis not present

## 2016-06-30 DIAGNOSIS — I5032 Chronic diastolic (congestive) heart failure: Secondary | ICD-10-CM | POA: Diagnosis not present

## 2016-06-30 DIAGNOSIS — Z950 Presence of cardiac pacemaker: Secondary | ICD-10-CM | POA: Diagnosis not present

## 2016-06-30 DIAGNOSIS — J449 Chronic obstructive pulmonary disease, unspecified: Secondary | ICD-10-CM | POA: Diagnosis not present

## 2016-07-01 DIAGNOSIS — J449 Chronic obstructive pulmonary disease, unspecified: Secondary | ICD-10-CM | POA: Diagnosis not present

## 2016-07-01 DIAGNOSIS — I110 Hypertensive heart disease with heart failure: Secondary | ICD-10-CM | POA: Diagnosis not present

## 2016-07-01 DIAGNOSIS — Z950 Presence of cardiac pacemaker: Secondary | ICD-10-CM | POA: Diagnosis not present

## 2016-07-01 DIAGNOSIS — I25119 Atherosclerotic heart disease of native coronary artery with unspecified angina pectoris: Secondary | ICD-10-CM | POA: Diagnosis not present

## 2016-07-01 DIAGNOSIS — R2681 Unsteadiness on feet: Secondary | ICD-10-CM | POA: Diagnosis not present

## 2016-07-01 DIAGNOSIS — I4891 Unspecified atrial fibrillation: Secondary | ICD-10-CM | POA: Diagnosis not present

## 2016-07-01 DIAGNOSIS — M6281 Muscle weakness (generalized): Secondary | ICD-10-CM | POA: Diagnosis not present

## 2016-07-01 DIAGNOSIS — I5032 Chronic diastolic (congestive) heart failure: Secondary | ICD-10-CM | POA: Diagnosis not present

## 2016-07-02 ENCOUNTER — Ambulatory Visit: Payer: Self-pay | Admitting: *Deleted

## 2016-07-02 ENCOUNTER — Other Ambulatory Visit: Payer: Self-pay | Admitting: *Deleted

## 2016-07-02 DIAGNOSIS — I110 Hypertensive heart disease with heart failure: Secondary | ICD-10-CM | POA: Diagnosis not present

## 2016-07-02 DIAGNOSIS — I4891 Unspecified atrial fibrillation: Secondary | ICD-10-CM | POA: Diagnosis not present

## 2016-07-02 DIAGNOSIS — I5032 Chronic diastolic (congestive) heart failure: Secondary | ICD-10-CM | POA: Diagnosis not present

## 2016-07-02 DIAGNOSIS — M6281 Muscle weakness (generalized): Secondary | ICD-10-CM | POA: Diagnosis not present

## 2016-07-02 DIAGNOSIS — I25119 Atherosclerotic heart disease of native coronary artery with unspecified angina pectoris: Secondary | ICD-10-CM | POA: Diagnosis not present

## 2016-07-02 DIAGNOSIS — Z950 Presence of cardiac pacemaker: Secondary | ICD-10-CM | POA: Diagnosis not present

## 2016-07-02 DIAGNOSIS — R2681 Unsteadiness on feet: Secondary | ICD-10-CM | POA: Diagnosis not present

## 2016-07-02 DIAGNOSIS — J449 Chronic obstructive pulmonary disease, unspecified: Secondary | ICD-10-CM | POA: Diagnosis not present

## 2016-07-02 NOTE — Patient Outreach (Signed)
[facility]) Care Management  07/02/2016  [name] Oct 25, 1917 [id number]  Transition of care call   Placed call to patient , spoke with [name], POA, hipaa verified. [name] discussed patient still has some swelling in her lower legs, not any worse, patient's weight today is 112.4, total of 2 pound weight gain in a week. Patient sleeps usually on 2 pillows at night, and she gets short of breath when walking to chair and takes a little while to recover .[name] denies patient complaining of chest pain or having need for nitroglycerin.   [name] discussed patient has been eating a little better on this week.  Home health RN for visit today around 12 noon.  Plan Will continue to follow patient for transition of care, home visit in next week as caregiver agreed more convenient . Caregiver will notify MD of worsening symptoms of heart failure, weight gain, swelling ,chest pain .   [name], RN, [facility] [phone]- Mobile [phone]- Toll Free Main Office

## 2016-07-05 ENCOUNTER — Encounter: Payer: Self-pay | Admitting: *Deleted

## 2016-07-05 ENCOUNTER — Telehealth: Payer: Self-pay | Admitting: Cardiology

## 2016-07-05 ENCOUNTER — Telehealth: Payer: Self-pay | Admitting: Cardiovascular Disease

## 2016-07-05 ENCOUNTER — Ambulatory Visit (INDEPENDENT_AMBULATORY_CARE_PROVIDER_SITE_OTHER): Payer: Medicare HMO | Admitting: *Deleted

## 2016-07-05 ENCOUNTER — Other Ambulatory Visit: Payer: Self-pay | Admitting: *Deleted

## 2016-07-05 DIAGNOSIS — Z950 Presence of cardiac pacemaker: Secondary | ICD-10-CM | POA: Diagnosis not present

## 2016-07-05 DIAGNOSIS — I495 Sick sinus syndrome: Secondary | ICD-10-CM | POA: Diagnosis not present

## 2016-07-05 DIAGNOSIS — I110 Hypertensive heart disease with heart failure: Secondary | ICD-10-CM | POA: Diagnosis not present

## 2016-07-05 DIAGNOSIS — R2681 Unsteadiness on feet: Secondary | ICD-10-CM | POA: Diagnosis not present

## 2016-07-05 DIAGNOSIS — I4891 Unspecified atrial fibrillation: Secondary | ICD-10-CM | POA: Diagnosis not present

## 2016-07-05 DIAGNOSIS — I25119 Atherosclerotic heart disease of native coronary artery with unspecified angina pectoris: Secondary | ICD-10-CM | POA: Diagnosis not present

## 2016-07-05 DIAGNOSIS — I5032 Chronic diastolic (congestive) heart failure: Secondary | ICD-10-CM | POA: Diagnosis not present

## 2016-07-05 DIAGNOSIS — J449 Chronic obstructive pulmonary disease, unspecified: Secondary | ICD-10-CM | POA: Diagnosis not present

## 2016-07-05 DIAGNOSIS — M6281 Muscle weakness (generalized): Secondary | ICD-10-CM | POA: Diagnosis not present

## 2016-07-05 MED ORDER — FUROSEMIDE 20 MG PO TABS: 80.0000 mg | ORAL_TABLET | Freq: Every day | ORAL | 3 refills | Status: DC

## 2016-07-05 NOTE — Telephone Encounter (Signed)
Please increase furosemide to 80 mg daily and make sure she is not getting additional salt in her diet

## 2016-07-05 NOTE — Telephone Encounter (Signed)
Confirmed remote transmission w/ pt daughter.   

## 2016-07-05 NOTE — Telephone Encounter (Signed)
[place] Nurse calling to speak with a clinical staff member on behalf of patient.Thanks.

## 2016-07-05 NOTE — Patient Outreach (Addendum)
[place] [facility]) Care Management   07/05/2016  [name] April 08, 1917 [id number]  [name] is an 83 y.o. female  Subjective:  I am a little more short of breath today, discussed trying to take a nap during the day, but couldn't lie down due to feeling short of breath.Daughter discussed concern regarding increased swelling in legs over the past few days, and weight up to 5 pounds in the last 5 days.  Patient discussed she doesn't want to have to go into hospital again. Patient discussed how she enjoys being a home just wish she could do more for herself,and doesn't like depending on others.  Patient discussed not having a good appetite, described that she wants to heave sometimes when daughter mentions food, but no vomiting and she tries to eat a little at each meal and drinking boost daily.     Objective:  BP 108/68 (Patient Position: Sitting, Cuff Size: Normal)   Pulse 82   Resp 20   Wt 113 lb (51.3 kg)   SpO2 93%   BMI 20.67 kg/m   Neatly dressed sitting in recliner chair on arrival  Review of Systems  Constitutional: Negative.   HENT: Negative.        Hard of hearing   Eyes: Negative.   Respiratory: Positive for shortness of breath.   Cardiovascular: Positive for leg swelling.       3+ pitting edema  in lower legs   Gastrointestinal: Negative.   Genitourinary: Negative.   Musculoskeletal: Positive for joint pain.       Right shoulder pain  Skin: Negative.   Neurological: Negative.   Endo/Heme/Allergies: Bruises/bleeds easily.  Psychiatric/Behavioral: Negative.     Physical Exam  Constitutional: She is oriented to person, place, and time. She appears well-developed and well-nourished.  Cardiovascular: Normal rate.   Respiratory: Effort normal.  Short of breath with activity,  Decreased breath sounds few crackles as bases   GI: Soft. Bowel sounds are normal.  Neurological: She is alert and oriented to person, place, and time.  Skin: Skin is warm  and dry.  Psychiatric: She has a normal mood and affect. Her behavior is normal. Judgment and thought content normal.    Encounter Medications:   Outpatient Encounter Prescriptions as of 07/05/2016  Medication Sig  . acetaminophen (TYLENOL) 500 MG tablet Take 1 tablet (500 mg total) by mouth at bedtime.  [name] amiodarone (PACERONE) 200 MG tablet Take 1 tablet (200 mg total) by mouth daily.  . Biotin 1000 MCG tablet Take 1 tablet (1 mg total) by mouth daily.  . Calcium Carbonate-Vitamin D (CALCIUM 600+D) 600-400 MG-UNIT tablet Take 1 tablet by mouth daily.  . carboxymethylcellulose 1 % ophthalmic solution Apply 1 drop to eye 3 (three) times daily. (Patient taking differently: Apply 1 drop to eye 3 (three) times daily as needed (dry eyes). )  . clopidogrel (PLAVIX) 75 MG tablet TAKE 1 TABLET (75 MG TOTAL) BY MOUTH DAILY.  . cycloSPORINE (RESTASIS) 0.05 % ophthalmic emulsion Place 1 drop into both eyes 2 (two) times daily as needed (dryness).   . Docosanol (ABREVA) 10 % CREA Apply 1 application topically 5 (five) times daily. Apply to affected area on lip  5 x day until healed  . feeding supplement (BOOST HIGH PROTEIN) LIQD Take 1 Container by mouth See admin instructions. Drink 1 everyday and another as needed for nutrition supplement.  . furosemide (LASIX) 20 MG tablet Take 2 tablets (40 mg total) by mouth daily.  [name]  isosorbide mononitrate (IMDUR) 30 MG 24 hr tablet Take 1 tablet (30 mg total) by mouth daily.  [name] KLOR-CON M10 10 MEQ tablet Take 1 tablet (10 mEq total) by mouth 2 (two) times daily.  [name] levothyroxine (SYNTHROID, LEVOTHROID) 112 MCG tablet Take 1 tablet (112 mcg total) by mouth daily before breakfast.  . nitroGLYCERIN (NITROSTAT) 0.4 MG SL tablet Place 1 tablet (0.4 mg total) under the tongue every 5 (five) minutes as needed for chest pain (For a total of 3 tablets, if chest pain persist to call 911).  . ranitidine (ZANTAC) 300 MG capsule Take 1 capsule (300 mg total) by mouth every  evening.  [name] rOPINIRole (REQUIP) 0.25 MG tablet Take 1 tablet (0.25 mg total) by mouth at bedtime.  . Vitamin D, Ergocalciferol, (DRISDOL) 50000 units CAPS capsule Take 1 capsule (50,000 Units total) by mouth every 7 (seven) days.   No facility-administered encounter medications on file as of 07/05/2016.     Functional Status:   In your present state of health, do you have any difficulty performing the following activities: 07/02/2016 06/14/2016  Hearing? Tempie Donning  Vision? N N  Difficulty concentrating or making decisions? N N  Walking or climbing stairs? Y N  Dressing or bathing? Y Y  Doing errands, shopping? Y N  Preparing Food and eating ? Y -  Using the Toilet? N -  In the past six months, have you accidently leaked urine? Y -  Do you have problems with loss of bowel control? N -  Managing your Medications? Y -  Managing your Finances? Y -  Housekeeping or managing your Housekeeping? Y -  Some recent data might be hidden    Fall/Depression Screening:    PHQ 2/9 Scores 04/26/2016 12/09/2014 09/12/2014 08/16/2014 08/01/2014  PHQ - 2 Score 0 0 2 1 1   PHQ- 9 Score - - 4 - -   Fall Risk  07/05/2016 04/26/2016 12/09/2014 10/15/2014 09/12/2014  Falls in the past year? No No No No No  Number falls in past yr: - - - - -  Injury with Fall? - - - - -  Risk for fall due to : Impaired balance/gait;Impaired mobility - - - -  Risk for fall due to (comments): high fall risk - - - -   Assessment:  Initial home visit, [facility] home health nurse visiting. Patient's daughter, [name] and her husband present during visit.    1.Patient Agreeable to [facility] care management follow up.  2.Heart Failure - increased shortness of breath, weight gain and swelling, weight gain  5 pounds in week,  eating little salt on food. Daughter questioning accuracy of weights/scale, re weighed on different scales weight showing the same,afternoon weight 117.6 , this morning weight 115.  patient does weigh 1st thing in the morning .   Cardiologist  notified by home health for recommendations related to increased swelling weight gain.   Patient sleeps in regular bed using 2 pillows under her head and one to elevate her legs on. Discussed hospital bed , that head of bed can elevate patient not sure if she wants that yet, she may try sleeping in her recliner chair tonight if she needs to  Daughter reports she has discussed palliative care with PCP at visit.      3.Fall Risk - High fall risk patient using rolling walker at home, Daughter asking for wheelchair to help with transporting to appointments, they already have a ramp at home. Patient tolerating doing her own personal  care and dressing herself daily, daughter available to assist as needed. Patient wears life alert pendant.    4. Decreased appetite- continues to complain of some decrease in appetite, eating small amounts at meals. Drinking at least one boost a day.   Plan:  1.Consent signed, Reviewed [facility] packet  2 Heart failure- MD office notified by Home health RN that visiting today,continue heart failure education. Reinforced with daughter to notify MD of unresolved or worsening of shortness of breath, swelling , weight gain of 3 pounds in a day or 5 in a week. Reinforced to limit salt in diet, to help with decreasing fluid and improving breathing. Emphasized with daughter new instructions for lasix.   Will provide further education and resource information on palliative as directed.  3 Fall prevention measures  reviewed,  will place call to PCP office regarding order for wheelchair. Will send PCP office visit note  Will continue weekly transition of care outreaches, next call this week to follow up on condition.    [facility] CM Care Plan Problem One     Most Recent Value  Care Plan Problem One  Recent hosptial admission related to heart failure  Role Documenting the Problem One  Care Management [place] for Problem One  Active  [facility] Long Term Goal (31-90  days)  Patient will not experience a hospital admission in the next 60 days   [facility] Long Term Goal Start Date  06/18/16  Interventions for Problem One Long Term Goal  Reinforced the importance of taking medications as prescribed , notifying MD/RN of concerns sooner, to arrange a office visit, Provided and reviewed EMMi handou on Heart Faiure when to call    [facility] CM Short Term Goal #1 (0-30 days)  Patient will report weighing daily and keeping a record in the next 30 days   [facility] CM Short Term Goal #1 Start Date  06/18/16  Interventions for Short Term Goal #1  Discussed importance of weighing same time of day, on accurate scales and documenting weights .   [facility] CM Short Term Goal #2 (0-30 days)  Patient will begin to identify symptoms of yellow zone symptoms and action plan in the next 30 days   [facility] CM Short Term Goal #2 Start Date  06/18/16  Interventions for Short Term Goal #2  Provided [facility] calendar, reviewed zones and action plan, to notify MD of weight gain of 3 pounds in a day, 5 in a week, swelling shortness .   [facility] CM Short Term Goal #3 (0-30 days)  Patient will report increase knowledge related to decreasing salt in diet in the next 30 days   [facility] CM Short Term Goal #3 Start Date  07/05/16  Interventions for Short Tern Goal #3  Provided and [facility] hi/low salt handout, how salt in foods effect fluid in body, reviewed label on food item patient eats        [name], RN, [place] [facility] [phone]- Mobile [phone]- [place]

## 2016-07-05 NOTE — Telephone Encounter (Signed)
Received a phone call from [place] at [facility]. She stated that the patient's weight went from 107 lbs April 10 th to 117.6 lbs today. The patient did complain of shortness of breath. Blood pressure was 108/68. She is currently on 40 mg of Lasix daily  Labs on 4/4 were:  Creatinine, Ser 0.44 - 1.00 mg/dL 1.30   1.34   Will route to the physician for recommendations.

## 2016-07-05 NOTE — Telephone Encounter (Signed)
Returned the phone call to [facility] from [facility] with Dr. Victorino December instructions:  Please increase furosemide to 80 mg daily and make sure she is not getting additional salt in her diet  Glenard Haring verbalized her understanding.

## 2016-07-06 ENCOUNTER — Encounter: Payer: Self-pay | Admitting: *Deleted

## 2016-07-06 NOTE — Progress Notes (Signed)
Remote pacemaker transmission.   

## 2016-07-07 ENCOUNTER — Telehealth: Payer: Self-pay | Admitting: Cardiovascular Disease

## 2016-07-07 ENCOUNTER — Telehealth: Payer: Self-pay

## 2016-07-07 DIAGNOSIS — I25119 Atherosclerotic heart disease of native coronary artery with unspecified angina pectoris: Secondary | ICD-10-CM | POA: Diagnosis not present

## 2016-07-07 DIAGNOSIS — M6281 Muscle weakness (generalized): Secondary | ICD-10-CM | POA: Diagnosis not present

## 2016-07-07 DIAGNOSIS — I5032 Chronic diastolic (congestive) heart failure: Secondary | ICD-10-CM | POA: Diagnosis not present

## 2016-07-07 DIAGNOSIS — J449 Chronic obstructive pulmonary disease, unspecified: Secondary | ICD-10-CM | POA: Diagnosis not present

## 2016-07-07 DIAGNOSIS — I110 Hypertensive heart disease with heart failure: Secondary | ICD-10-CM | POA: Diagnosis not present

## 2016-07-07 DIAGNOSIS — Z950 Presence of cardiac pacemaker: Secondary | ICD-10-CM | POA: Diagnosis not present

## 2016-07-07 DIAGNOSIS — R2681 Unsteadiness on feet: Secondary | ICD-10-CM | POA: Diagnosis not present

## 2016-07-07 DIAGNOSIS — I4891 Unspecified atrial fibrillation: Secondary | ICD-10-CM | POA: Diagnosis not present

## 2016-07-07 NOTE — Telephone Encounter (Signed)
New Message   Pt c/o Shortness Of Breath: STAT if SOB developed within the last 24 hours or pt is noticeably SOB on the phone  1. Are you currently SOB (can you hear that pt is SOB on the phone)? Talking to nurse   2. How long have you been experiencing SOB? week  3. Are you SOB when sitting or when up moving around? Both   4. Are you currently experiencing any other symptoms?  First week severe weight gain    Rn would like a rescue inhaler called in for pt has appt 07/08/16 2p

## 2016-07-07 NOTE — Telephone Encounter (Signed)
Glenard Haring, RN with home health called stating that the patient has put on quite a bit of weight from fluid retention and she is having difficulty breathing.  She states that she called cardiology but they could not see the patient until tomorrow and refused to call in an inhaler.  Sanjuan Dame that there is not a provider in the office today to request an RX from and that she should call cardiology back to see if they would reconsider an inhaler RX or should the patient be seen at the ER.  Levada Dy expressed understanding and is agreeable.  Charyl Bigger, CMA

## 2016-07-07 NOTE — Telephone Encounter (Signed)
Spoke with [facility] nurse she states that pt is SOB almost all the time and states that pt needs short acting inhaler. Pt would like rx sent to her pharmacy. Pt has appt tomorrow but would like inhaler sent today. s/w DOD states that pt will have to wait for appt for inhaler RX. Will have to call pcp. If this is cardiac related inhaler will not help anyway. Pt daugher Kennedy,[facility]) notified. Appt tomorrow at 2pm scheduled-notified.

## 2016-07-08 ENCOUNTER — Ambulatory Visit: Payer: Self-pay | Admitting: *Deleted

## 2016-07-08 ENCOUNTER — Encounter: Payer: Self-pay | Admitting: Cardiology

## 2016-07-08 ENCOUNTER — Encounter: Payer: Self-pay | Admitting: Cardiovascular Disease

## 2016-07-08 ENCOUNTER — Ambulatory Visit (INDEPENDENT_AMBULATORY_CARE_PROVIDER_SITE_OTHER): Payer: Medicare HMO | Admitting: Cardiovascular Disease

## 2016-07-08 VITALS — BP 102/72 | HR 88 | Ht 62.0 in | Wt 119.0 lb

## 2016-07-08 DIAGNOSIS — I714 Abdominal aortic aneurysm, without rupture, unspecified: Secondary | ICD-10-CM

## 2016-07-08 DIAGNOSIS — I481 Persistent atrial fibrillation: Secondary | ICD-10-CM

## 2016-07-08 DIAGNOSIS — I495 Sick sinus syndrome: Secondary | ICD-10-CM

## 2016-07-08 DIAGNOSIS — I25118 Atherosclerotic heart disease of native coronary artery with other forms of angina pectoris: Secondary | ICD-10-CM | POA: Diagnosis not present

## 2016-07-08 DIAGNOSIS — Z950 Presence of cardiac pacemaker: Secondary | ICD-10-CM | POA: Diagnosis not present

## 2016-07-08 DIAGNOSIS — I5033 Acute on chronic diastolic (congestive) heart failure: Secondary | ICD-10-CM

## 2016-07-08 DIAGNOSIS — I4819 Other persistent atrial fibrillation: Secondary | ICD-10-CM

## 2016-07-08 LAB — CUP PACEART REMOTE DEVICE CHECK
Brady Statistic AP VP Percent: 2 %
Brady Statistic AP VS Percent: 74 %
Brady Statistic AS VS Percent: 19 %
Date Time Interrogation Session: 20180423182117
Implantable Lead Implant Date: 20090723
Implantable Lead Location: 753859
Implantable Lead Location: 753860
Implantable Pulse Generator Implant Date: 20090723
Lead Channel Impedance Value: 508 Ohm
Lead Channel Pacing Threshold Amplitude: 0.875 V
Lead Channel Pacing Threshold Pulse Width: 0.4 ms
Lead Channel Pacing Threshold Pulse Width: 0.4 ms
Lead Channel Setting Pacing Pulse Width: 0.4 ms
Lead Channel Setting Sensing Sensitivity: 4 mV
MDC IDC LEAD IMPLANT DT: 20090723
MDC IDC MSMT BATTERY IMPEDANCE: 7961 Ohm
MDC IDC MSMT BATTERY REMAINING LONGEVITY: 1 mo — AB
MDC IDC MSMT BATTERY VOLTAGE: 2.6 V
MDC IDC MSMT LEADCHNL RA IMPEDANCE VALUE: 539 Ohm
MDC IDC MSMT LEADCHNL RA PACING THRESHOLD AMPLITUDE: 0.75 V
MDC IDC SET LEADCHNL RA PACING AMPLITUDE: 1.5 V
MDC IDC SET LEADCHNL RV PACING AMPLITUDE: 2 V
MDC IDC STAT BRADY AS VP PERCENT: 4 %

## 2016-07-08 MED ORDER — METOLAZONE 2.5 MG PO TABS: 2.5000 mg | ORAL_TABLET | ORAL | 3 refills | Status: DC

## 2016-07-08 MED ORDER — FUROSEMIDE 80 MG PO TABS: 80.0000 mg | ORAL_TABLET | Freq: Every day | ORAL | 3 refills | Status: DC

## 2016-07-08 NOTE — Patient Instructions (Signed)
Dr Sallyanne Kuster has recommended making the following medication changes: 1. START Metolazone 2.5 mg - take 1 tablet by mouth Monday, Wednesday, and Friday 30 minutes before Furosemide  New TARGET DRY WEIGHT = 110 pounds or less on home scale! Please contact the office IN ONE WEEK if your weight does not come down to your target weight!  Keep your follow-up appointment in June!

## 2016-07-08 NOTE — Progress Notes (Signed)
Patient ID: [name], female   DOB: Aug 30, 1917, 83 y.o.   MRN: [id number]    Cardiology Office Note    Date:  07/09/2016   ID:  [name], DOB [birth year], MRN [id number]  PCP:  [name], NP  Cardiologist:   Sanda Klein, MD   Chief Complaint  Patient presents with  . Follow-up  . Shortness of Breath  . Chest Pain    History of Present Illness:  [name] is a 83 y.o. female with coronary artery disease, history of paroxysmal atrial fibrillation, previous TIA, sinus node dysfunction and a dual-chamber pacemaker, diastolic heart failure, COPD, hypertension and hypothyroidism returning for follow-up.  She is having "panic attacks" in her home health nurse suggested that she might benefit from an inhaler (presumably since she heard wheezing).  On our scale she weighs 9 pounds more than she did at her previous appointment, possibly 12 pounds more than at hospital discharge, she has developed more pedal edema. She loves salty foods and snacks.  She remains in persistent atrial fibrillation. There is a little bit of atrial under sensing, but she clearly has been an uninterrupted atrial fibrillation for many months now. The average ventricular rate is in the 80-90s. She only paces the ventricle roughly 6.5% of the time. Her device is very close to elective replacement indicator.  She still complains of some discomfort in her right shoulder, but the way she describes it today it is clearly positional and improves if she leans forward and allows her right arm to dangle. The past, this discomfort reportedly responded promptly to sublingual nitroglycerin. She has dementia and hard of hearing and it's really hard to get an accurate history   CAD: In 1997 she underwent urgent stenting of the proximal LAD artery for NSTEMI and then underwent a staged stent to the left circumflex coronary artery, complicated by frequent nonsustained ventricular tachycardia. A planned staged intervention to  the right coronary artery was performed later the same year.  She required repeat stenting of all 3 coronary arteries in 2006, when she presented with unstable angina. These procedures were performed on 3 consecutive days and a rather complicated fashion. Drug-eluting stents were placed in the proximal LAD and proximal RCA, with bare-metal stents in the proximal left circumflex and mid LAD. There was residual 50% stenosis of the left main coronary artery, but she has not required any revascularization procedure since.  Her last nuclear stress test performed in 2012 and showed low risk findings.   CHF: she bears a diagnosis of diastolic heart failure and takes a very low dose of loop diuretic. Attempts to discontinue her furosemide completely led to marked edema and it was restarted.  PAD: She has an infrarenal abdominal aortic aneurysm has been recognized for 18 years without much progression. Most recent ultrasound in May of 2015 measured at 3.8x3.8 cm. Has received a previous stent to the right common iliac artery and has mild bilateral stenoses in both iliac arteries by her most recent ultrasound performed in 2014. She does not have intermittent claudication. Bilateral ABIs were greater than 1.0. Stable <50% bilateral internal carotid artery lesions, also unchanged for several years.   Arrhythmia: She has a history of sinus node dysfunction and paroxysmal atrial fibrillation and received a dual-chamber Medtronic adapta pacemaker in 2009. She had 2 weeks of persistent atrial fibrillation in April 2017 during an episode of pneumonia.. Atrial pacing occurs virtually 100% of the time. Ventricular pacing occurs very infrequently. She failed  multaq antiarrhythmic therapy and is now receiving amiodarone in a low dose.   Risk factors: She has been intolerant to numerous statins including pravastatin, atorvastatin, simvastatin and daily or weekly Crestor.   Significant comorbidities also include  treated hypothyroidism. She does not have diabetes mellitus and quit smoking decades ago. She has had multiple repeat surgeries for her left hip with the most recent revision being in June of 2013. She uses a walker and occasionally gets around with a cane.   Past Medical History:  Diagnosis Date  . Abdominal aortic aneurysm ([place])   . Anemia    hx  . Asthma    hx  . Atrial fibrillation ([place])   . Blood transfusion   . Blood transfusion without reported diagnosis   . CAD (coronary artery disease)   . COPD (chronic obstructive pulmonary disease) ([place])   . GERD (gastroesophageal reflux disease)   . GI bleed 2007  . H/O hiatal hernia   . Heart attack ([place])   . Heart disease   . History of right hip replacement 2013  . Hyperlipidemia   . Hypertension   . Hypothyroidism   . Myocardial infarction ([place])   . Osteoporosis   . Pacemaker   . Renal failure, chronic   . SSS (sick sinus syndrome) ([place]) 10/05/2007   Medtronic Adapta  . Stroke ([place])   . TIA (transient ischemic attack)   . UTI (lower urinary tract infection)    hx    Past Surgical History:  Procedure Laterality Date  . CARDIAC CATHETERIZATION    . COLONOSCOPY    . CORONARY STENT PLACEMENT     x9  . FRACTURE SURGERY     femur fx, /w ORIF into HIP- 2008  . JOINT REPLACEMENT    . NM MYOCAR PERF WALL MOTION  10/08/2010   mild apical anterior ischemia  . PACEMAKER INSERTION  10/05/2007   Medtronic Adapta  . TOTAL HIP ARTHROPLASTY     planned for 08/27/2011 Left  . TOTAL HIP ARTHROPLASTY  08/27/2011   Procedure: TOTAL HIP ARTHROPLASTY;  Surgeon: Kerin Salen, MD;  Location: [place];  Service: Orthopedics;  Laterality: Right;  . US ECHOCARDIOGRAPHY  08/23/2011   EF 50%,LA mod. dilated,mild to mod. mitral annular ca+,mod. MR,mild to mod TR,mild to mod. PH,trace AI.    Current Medications: Outpatient Medications Prior to Visit  Medication Sig Dispense Refill  . acetaminophen (TYLENOL) 500 MG tablet Take 1 tablet (500 mg  total) by mouth at bedtime. 30 tablet 2  . amiodarone (PACERONE) 200 MG tablet Take 1 tablet (200 mg total) by mouth daily. 90 tablet 1  . Biotin 1000 MCG tablet Take 1 tablet (1 mg total) by mouth daily. (Patient taking differently: Take 5,000 mcg by mouth daily. ) 90 tablet 2  . Calcium Carbonate-Vitamin D (CALCIUM 600+D) 600-400 MG-UNIT tablet Take 1 tablet by mouth daily. 90 tablet 2  . carboxymethylcellulose 1 % ophthalmic solution Apply 1 drop to eye 3 (three) times daily. (Patient taking differently: Apply 1 drop to eye 3 (three) times daily as needed (dry eyes). ) 30 mL 2  . clopidogrel (PLAVIX) 75 MG tablet TAKE 1 TABLET (75 MG TOTAL) BY MOUTH DAILY. 90 tablet 1  . cycloSPORINE (RESTASIS) 0.05 % ophthalmic emulsion Place 1 drop into both eyes 2 (two) times daily as needed (dryness).     . diphenhydrAMINE (BENADRYL) 25 mg capsule Take 25 mg by mouth at bedtime as needed.    . Docosanol (ABREVA) 10 %  CREA Apply 1 application topically 5 (five) times daily. Apply to affected area on lip  5 x day until healed 1 Tube 2  . feeding supplement (BOOST HIGH PROTEIN) LIQD Take 1 Container by mouth See admin instructions. Drink 1 everyday and another as needed for nutrition supplement.    . isosorbide mononitrate (IMDUR) 30 MG 24 hr tablet Take 1 tablet (30 mg total) by mouth daily. 90 tablet 3  . KLOR-CON M10 10 MEQ tablet Take 1 tablet (10 mEq total) by mouth 2 (two) times daily. 60 tablet 1  . levothyroxine (SYNTHROID, LEVOTHROID) 112 MCG tablet Take 1 tablet (112 mcg total) by mouth daily before breakfast. 90 tablet 0  . nitroGLYCERIN (NITROSTAT) 0.4 MG SL tablet Place 1 tablet (0.4 mg total) under the tongue every 5 (five) minutes as needed for chest pain (For a total of 3 tablets, if chest pain persist to call 911). 50 tablet 3  . ranitidine (ZANTAC) 300 MG capsule Take 1 capsule (300 mg total) by mouth every evening. 90 capsule 1  . rOPINIRole (REQUIP) 0.25 MG tablet Take 1 tablet (0.25 mg total)  by mouth at bedtime. 90 tablet 1  . Vitamin D, Ergocalciferol, (DRISDOL) 50000 units CAPS capsule Take 1 capsule (50,000 Units total) by mouth every 7 (seven) days. 12 capsule 0  . furosemide (LASIX) 20 MG tablet Take 4 tablets (80 mg total) by mouth daily. (Patient taking differently: Take 40 mg by mouth daily. ) 60 tablet 3   No facility-administered medications prior to visit.      Allergies:   Nubain [nalbuphine hcl]; Pineapple; Iodine; Mirtazapine; and Statins   Social History   Social History  . Marital status: Widowed    Spouse name: N/A  . Number of children: 4  . Years of education: N/A   Occupational History  . Retired    Social History Main Topics  . Smoking status: Former Smoker    Types: Cigarettes  . Smokeless tobacco: Never Used     Comment: quit 40 yrs ago- was smoking 2 packs a day at the most  . Alcohol use No  . Drug use: No  . Sexual activity: Yes    Birth control/ protection: Post-menopausal   Other Topics Concern  . None   Social History Narrative   Diet:      Do you drink/ eat things with caffeine? yes      Marital status:    widow                           What year were you married ?  1940      Do you live in a house, apartment,assistred living, condo, trailer, etc.)?  Mobile home      Is it one or more stories? 1      How many persons live in your home ?  1      Do you have any pets in your home ?(please list)  1 cat      Current or past profession: factory worker      Do you exercise?  yes                           Type & how often: small amount      Do you have a living will?  no      Do you have a DNR form?  no                  If not, do you want to discuss one?       Do you have signed POA?HPOA forms?  no               If so, please bring to your        appointment           Family History:  The patient's family history includes Atrial fibrillation in her daughter and daughter; Cancer in her brother, brother, sister, and  sister; Diabetes in her daughter; Emphysema in her father; Heart Problems in her son; Heart disease in her mother; Stroke in her daughter.   ROS:   Please see the history of present illness.    ROS All other systems reviewed and are negative.   PHYSICAL EXAM:   VS:  BP 102/72   Pulse 88   Ht 5\' 2"  (1.575 m)   Wt 54 kg (119 lb)   BMI 21.77 kg/m    GEN: Cachectic, well developed, in no acute distress . Prominent thoracic kyphosis  HEENT: normal  Neck: no JVD, carotid bruits, or masses Cardiac: Irregularly irregular rhythm; no murmurs, rubs, or gallops, 2+ ankle edema symmetrically , healthy appearing pacemaker site Respiratory:  Diminished breath sounds throughout but otherwise clear to auscultation bilaterally, normal work of breathing GI: soft, nontender, nondistended, + BS MS: no deformity or atrophy  Skin: warm and dry, no rash Neuro:  Alert and Oriented x 3, Strength and sensation are intact. She is extremely hard of hearing Psych: euthymic mood, full affect  Wt Readings from Last 3 Encounters:  07/08/16 54 kg (119 lb)  07/05/16 52.2 kg (115 lb)  06/24/16 49.9 kg (110 lb)      Studies/Labs Reviewed:   EKG:  EKG is not ordered today.  The ekg ordered 06/14/2016 demonstrates AFib with RVR, nonspecific intraventricular conduction delay . The pacemaker intracardiac electrogram shows atrial fibrillation  Recent Labs: 06/13/2016: B Natriuretic Peptide 903.0 06/14/2016: ALT 25; Magnesium 1.7; Platelets 129 06/16/2016: BUN 23; Creatinine, Ser 1.30; Potassium 4.3; Sodium 140 06/23/2016: Hemoglobin 13.3; TSH 0.260   Lipid Panel    Component Value Date/Time   CHOL 157 04/26/2016 1001   TRIG 109 04/26/2016 1001   HDL 48 04/26/2016 1001   CHOLHDL 3.3 04/26/2016 1001   CHOLHDL 3.3 12/01/2012 1035   VLDL 24 12/01/2012 1035   LDLCALC 87 04/26/2016 1001    Additional studies/ records that were reviewed today include:  Records from recent hospitalization  ASSESSMENT:    1. Acute on  chronic diastolic (congestive) heart failure ([place])   2. Coronary artery disease involving native coronary artery of native heart with other form of angina pectoris ([place])   3. SSS (sick sinus syndrome) ([facility])   4. Persistent atrial fibrillation ([place])   5. Pacemaker - dual chamber Medtronic 2009   6. Abdominal aortic aneurysm (AAA) without rupture ([facility])      PLAN:  In order of problems listed above:  1. CHF: Appears to be hypervolemic and has definitely gained 9 lb weight in just 2 weeks. I wonder if her "anxiety attacks" and wheezing are actually all caused by worsening heart failure. I don't think she needs a bronchodilator, but would benefit from diuresis. Reviewed the importance of sodium restriction with the patient (I think she understood but I wonder if she'll remember) and also with her daughter. We'll add metolazone intermittently, 3 days a  week. 2. CAD: Complicated history of multiple percutaneous revascularization procedures and residual stenosis of the left main coronary artery. Based on the information she provides today and no longer sure that her right shoulder pain is her angina equivalent. On long-acting nitrates. Statin intolerant.  3. SSS: She is very sedentary. Current pacemaker settings appear to be appropriate at last device check. Currently in atrial fibrillation. Some under sensing on her device, but after changing the sensitivity, it appears that she is on long-term persistent atrial fibrillation. She requires very little ventricular pacing. 4. AFib:  CHADSVasc 8 (age 60, TIA 2, HTN, CAD, GHF, gender). Both her embolic risk and her bleeding risk are very high. She requires antiplatelet therapy. We have discussed this with the family on a couple of occasions and have agreed that aspirin and clopidogrel is the best compromise. Will not start anticoagulants. She is still on amiodarone despite the fact that she is been in atrial fibrillation now for quite a while. Her blood pressure  is rather low and we need to use antianginal medications. We'll continue amiodarone for rate control despite its higher risk of side effects. 5. PPM: Anticipate generator will reach ERI in the near future. She is not pacemaker dependent. Since she is now in persistent atrial fibrillation she may actually not require pacemaker therapy. Will discuss whether or not she should undergo a generator replacement with her family, when the time comes. 6. AAA (abdominal aortic aneurysm): Asymptomatic, stable in size at about 3.8 cm - I don't think she is a surgical or percutaneous repair candidate; routine monitoring does not appear indicated anymore.  7. Palliative care: I reiterated that in my opinion Mrs. Clinkscales's care should focus primarily on symptom relief. She is almost 83 years old and her quality of life and functional status are poor. Therefore I recommend that we continue treatment with diuretics and antianginals to improve symptoms, but I would advise against pacemaker generator change out or further monitoring of her aneurysm.   Medication Adjustments/Labs and Tests Ordered: Current medicines are reviewed at length with the patient today.  Concerns regarding medicines are outlined above.  Medication changes, Labs and Tests ordered today are listed in the Patient Instructions below. Patient Instructions  Dr Sallyanne Kuster has recommended making the following medication changes: 1. START Metolazone 2.5 mg - take 1 tablet by mouth Monday, Wednesday, and Friday 30 minutes before Furosemide  New TARGET DRY WEIGHT = 110 pounds or less on home scale! Please contact the office IN ONE WEEK if your weight does not come down to your target weight!  Keep your follow-up appointment in June!    Signed, Sanda Klein, MD  07/09/2016 1:02 PM    [facility] [address], [place], [place]  [address] Phone: [phone]; Fax: [phone]

## 2016-07-08 NOTE — Progress Notes (Signed)
Letter  

## 2016-07-12 ENCOUNTER — Other Ambulatory Visit: Payer: Self-pay | Admitting: *Deleted

## 2016-07-12 ENCOUNTER — Telehealth: Payer: Self-pay | Admitting: Adult Health

## 2016-07-12 ENCOUNTER — Other Ambulatory Visit: Payer: Self-pay | Admitting: Adult Health

## 2016-07-12 DIAGNOSIS — R531 Weakness: Secondary | ICD-10-CM

## 2016-07-12 DIAGNOSIS — I509 Heart failure, unspecified: Secondary | ICD-10-CM

## 2016-07-12 NOTE — Telephone Encounter (Signed)
Pt's daughter Ms. Merrilyn Puma called to request Rx refill for Vitamin D suppliment--advised her to contact pharmacy 1st for refill--she also stated that Ms. Slagter is taking another vitamin D (two diff ones) and she isn't sure which one the doctor wants her to continue ( the old one or the new one)

## 2016-07-12 NOTE — Progress Notes (Signed)
[name] with Home Health called this afternoon to share that [name]'s CHF continues to worsen and her dyspnea and lower ext edema are making her very uncomfortable.  [name] will put local Hospice/Palliative care in touch with [name]'s daughter. [name] also requested a wheelchair to assist with [name]'s mobility and ease care giving for her daughter.

## 2016-07-12 NOTE — Patient Outreach (Signed)
[facility]) Care Management  07/12/2016  [name] 1917/12/01 [id number]  Transition of care call  Spoke [name] , daughter of [name], she reports the patient still has some shortness of breath,little changes in swelling in leg, no increase in weights, 116.4 today , and previous days weights, 116.4, 115.2. . Daughter discussed recent visit to Cardiology office related to shortness of breath and swelling,  patient does not want to go into hospital, and wants to give increased lasix and Metolazone dosing a chance and will contact [name] in a week if weight does not come down.   Daughter discussed she just wants patient to be comfortable. Patient tolerating out of bed in chair, but short of breath when walking any distance. Daughter limiting salt in diet  [name] discussed patient with complaint of itching for the last week, she has purchased a over the counter lotion that seemed to work better last night .  Daughter is asking for a wheelchair to assist with patient getting to appointments.   Plan Will call PCP office regarding wheelchair. Discuss palliative care, daughter wants more information on cost,they have used  [facility] and agreement to speaking with representative on the phone to obtain  more information, will call [name], NP to update .   Daughter will notify [name] if symptoms worsen or weight does not come down by one week.  Will plan follow up telephone visit in the next week as part of transition of care.   [name], RN, [facility] [facility] [phone]- Mobile [phone]- Toll Free Main Office

## 2016-07-12 NOTE — Telephone Encounter (Signed)
Pt's daughter informed that pt should continue Vitamin D 50K units and OTC calcium with vitamin D.  Daughter expressed understanding and is agreeable.  Charyl Bigger, CMA

## 2016-07-14 ENCOUNTER — Other Ambulatory Visit: Payer: Self-pay | Admitting: *Deleted

## 2016-07-14 NOTE — Patient Outreach (Signed)
[facility]) Care Management  07/14/2016  [name] 12/31/1917 [id number]  Late entry for 07/13/16  Placed call to patient to follow up regarding order for wheel chair, spoke with [name] , discussed [name], NP has provided order for wheel chair. Discussed with daughter order needs to be sent to agency that provides equipment, listed agency in area that insurance covers, [name] has chosen [facility].   [name] is agreeable to speaking to [name], to discuss palliative care, cost coverage to answer some questions she has.  Placed call to [name] to provide daughter's contact information and explain that [name] has questions about palliative care .    Plan Will follow up with patient by telephone  in the next week as part of transition of care program. Faxed order for wheelchair to [facility].    [name], RN, [place] [facility] [phone]- Mobile [phone]- Toll Free Main Office

## 2016-07-15 ENCOUNTER — Telehealth: Payer: Self-pay | Admitting: Adult Health

## 2016-07-15 NOTE — Telephone Encounter (Signed)
Inez Catalina called from [facility] stating Ms. Broussard's daughter is requesting they start back up care for patient-- Please fax referral authorizing treatment to [phone]. --glh

## 2016-07-15 NOTE — Telephone Encounter (Signed)
Afternoon Tonya, Can you please call In[facility] and inquire what they need. Thanks! Valetta Fuller

## 2016-07-15 NOTE — Telephone Encounter (Signed)
LVM for pt to call to discuss.  T. Federica Allport, CMA  

## 2016-07-16 ENCOUNTER — Telehealth: Payer: Self-pay | Admitting: Adult Health

## 2016-07-16 DIAGNOSIS — I4891 Unspecified atrial fibrillation: Secondary | ICD-10-CM | POA: Diagnosis not present

## 2016-07-16 DIAGNOSIS — R2681 Unsteadiness on feet: Secondary | ICD-10-CM | POA: Diagnosis not present

## 2016-07-16 DIAGNOSIS — M6281 Muscle weakness (generalized): Secondary | ICD-10-CM | POA: Diagnosis not present

## 2016-07-16 DIAGNOSIS — J449 Chronic obstructive pulmonary disease, unspecified: Secondary | ICD-10-CM | POA: Diagnosis not present

## 2016-07-16 DIAGNOSIS — Z950 Presence of cardiac pacemaker: Secondary | ICD-10-CM | POA: Diagnosis not present

## 2016-07-16 DIAGNOSIS — I110 Hypertensive heart disease with heart failure: Secondary | ICD-10-CM | POA: Diagnosis not present

## 2016-07-16 DIAGNOSIS — I25119 Atherosclerotic heart disease of native coronary artery with unspecified angina pectoris: Secondary | ICD-10-CM | POA: Diagnosis not present

## 2016-07-16 DIAGNOSIS — I5032 Chronic diastolic (congestive) heart failure: Secondary | ICD-10-CM | POA: Diagnosis not present

## 2016-07-16 NOTE — Telephone Encounter (Signed)
LVM for In[facility] to return call.  Charyl Bigger, CMA

## 2016-07-16 NOTE — Telephone Encounter (Signed)
Inez Catalina returned your call Kenney Houseman unfortunately it was after you left for the day 07/16/16. pls call her on next business day @ [phone]. --Dion Body

## 2016-07-19 ENCOUNTER — Other Ambulatory Visit: Payer: Self-pay | Admitting: Adult Health

## 2016-07-19 DIAGNOSIS — R0602 Shortness of breath: Secondary | ICD-10-CM

## 2016-07-19 DIAGNOSIS — I509 Heart failure, unspecified: Secondary | ICD-10-CM

## 2016-07-19 NOTE — Telephone Encounter (Signed)
Inez Catalina states that pt was discharged from facility and is requesting in home pallative care and they need a new order for this.  Please advise.  Charyl Bigger, CMA

## 2016-07-19 NOTE — Progress Notes (Signed)
Palliative care order placed. Re:  Worsening dyspnea and lower ext. Edema r/t CHF.

## 2016-07-19 NOTE — Telephone Encounter (Signed)
LVM requesting pt to call the office.  

## 2016-07-19 NOTE — Telephone Encounter (Signed)
Pt informed

## 2016-07-19 NOTE — Telephone Encounter (Signed)
Morning Estill Bamberg, Can you please call Ms. Daoud and let her know that her Palliative Care order was placed. Please ask if she needs anything else. Thanks! Valetta Fuller

## 2016-07-20 DIAGNOSIS — I5032 Chronic diastolic (congestive) heart failure: Secondary | ICD-10-CM | POA: Diagnosis not present

## 2016-07-20 DIAGNOSIS — M79605 Pain in left leg: Secondary | ICD-10-CM | POA: Diagnosis not present

## 2016-07-21 ENCOUNTER — Other Ambulatory Visit: Payer: Self-pay | Admitting: *Deleted

## 2016-07-21 ENCOUNTER — Telehealth: Payer: Self-pay | Admitting: Cardiovascular Disease

## 2016-07-21 DIAGNOSIS — Z950 Presence of cardiac pacemaker: Secondary | ICD-10-CM | POA: Diagnosis not present

## 2016-07-21 DIAGNOSIS — M6281 Muscle weakness (generalized): Secondary | ICD-10-CM | POA: Diagnosis not present

## 2016-07-21 DIAGNOSIS — I4891 Unspecified atrial fibrillation: Secondary | ICD-10-CM | POA: Diagnosis not present

## 2016-07-21 DIAGNOSIS — I110 Hypertensive heart disease with heart failure: Secondary | ICD-10-CM | POA: Diagnosis not present

## 2016-07-21 DIAGNOSIS — I5032 Chronic diastolic (congestive) heart failure: Secondary | ICD-10-CM | POA: Diagnosis not present

## 2016-07-21 DIAGNOSIS — I25119 Atherosclerotic heart disease of native coronary artery with unspecified angina pectoris: Secondary | ICD-10-CM | POA: Diagnosis not present

## 2016-07-21 DIAGNOSIS — J449 Chronic obstructive pulmonary disease, unspecified: Secondary | ICD-10-CM | POA: Diagnosis not present

## 2016-07-21 DIAGNOSIS — R2681 Unsteadiness on feet: Secondary | ICD-10-CM | POA: Diagnosis not present

## 2016-07-21 NOTE — Telephone Encounter (Signed)
Please continue services. Any head injury? MCr

## 2016-07-21 NOTE — Telephone Encounter (Signed)
New Message  Lynann Bologna voiced calling to speak to nurse pt had fall last night and needs to report this and she's going to continue to see pt for couple more weeks due to medication and fall.  Please f/u

## 2016-07-21 NOTE — Patient Outreach (Signed)
[facility]) Care Management  07/21/2016  KYUNG MUTO [birth year] [id number]   Transition of care call  Spoke with daughter , [name] on behalf of patient , hipaa information verified.  Daughter discussed patient is breathing so much better her legs still have swelling but her weight is down to 107.6 .  Daughter discussed patient tiring very easy and she is glad that she now has a wheelchair. Daughter discussed patient had a fall on last night, describes patient doesn't keep walker in close reach at night and she didn't get both hands on the walker before she started to move and had a fall to floor but no injury she and son able to assist patient from the floor. Daughter reports home health RN visited today evaluated patient and notified MD.  Daughter reports she has received an initial call from [facility] palliative care, and discussed patient was followed by palliative care while she was at [facility], daughter was not aware. [name] is awaiting initial visit from Palliative care to discuss questions she has about how payment for services work.    Patient has completed 31 days of transition of care  Plan Will continue to follow for 60 day complex care management . Will follow up with patient in next week regarding decision for palliative care services.   [name], RN, [facility] [phone]- Mobile [phone]- Toll Free Main Office

## 2016-07-21 NOTE — Telephone Encounter (Signed)
Per the nurse, no injuries occurred. Services will continue for two more weeks.

## 2016-07-21 NOTE — Telephone Encounter (Signed)
Spoke with Lynann Bologna, nurse for the patient. She stated that the patient fell yesterday but did not receive any injuries. Per medicare, she will need to continue services for two more weeks. She wanted to make the physician aware of the changes.

## 2016-07-22 DIAGNOSIS — J449 Chronic obstructive pulmonary disease, unspecified: Secondary | ICD-10-CM | POA: Diagnosis not present

## 2016-07-22 DIAGNOSIS — I4891 Unspecified atrial fibrillation: Secondary | ICD-10-CM | POA: Diagnosis not present

## 2016-07-22 DIAGNOSIS — M6281 Muscle weakness (generalized): Secondary | ICD-10-CM | POA: Diagnosis not present

## 2016-07-22 DIAGNOSIS — R2681 Unsteadiness on feet: Secondary | ICD-10-CM | POA: Diagnosis not present

## 2016-07-22 DIAGNOSIS — I110 Hypertensive heart disease with heart failure: Secondary | ICD-10-CM | POA: Diagnosis not present

## 2016-07-22 DIAGNOSIS — Z950 Presence of cardiac pacemaker: Secondary | ICD-10-CM | POA: Diagnosis not present

## 2016-07-22 DIAGNOSIS — I25119 Atherosclerotic heart disease of native coronary artery with unspecified angina pectoris: Secondary | ICD-10-CM | POA: Diagnosis not present

## 2016-07-22 DIAGNOSIS — I5032 Chronic diastolic (congestive) heart failure: Secondary | ICD-10-CM | POA: Diagnosis not present

## 2016-07-23 ENCOUNTER — Telehealth: Payer: Self-pay | Admitting: Cardiovascular Disease

## 2016-07-23 NOTE — Telephone Encounter (Signed)
Recommendations communicated to caller, who voiced acknowledgment and thanks.

## 2016-07-23 NOTE — Telephone Encounter (Signed)
Erasmo Downer a physical therapist with [facility] is calling and would like to extend patient's physical therapy for 4 weeks, twice a week. Erasmo Downer would like an verbal order to approve this. Thanks.

## 2016-07-23 NOTE — Telephone Encounter (Signed)
Yes, continue PT

## 2016-07-23 NOTE — Telephone Encounter (Signed)
Continuation of physical therapy OK?

## 2016-07-27 ENCOUNTER — Ambulatory Visit: Payer: Medicare HMO | Admitting: Adult Health

## 2016-07-27 DIAGNOSIS — I110 Hypertensive heart disease with heart failure: Secondary | ICD-10-CM | POA: Diagnosis not present

## 2016-07-27 DIAGNOSIS — I5032 Chronic diastolic (congestive) heart failure: Secondary | ICD-10-CM | POA: Diagnosis not present

## 2016-07-27 DIAGNOSIS — I25119 Atherosclerotic heart disease of native coronary artery with unspecified angina pectoris: Secondary | ICD-10-CM | POA: Diagnosis not present

## 2016-07-27 DIAGNOSIS — Z950 Presence of cardiac pacemaker: Secondary | ICD-10-CM | POA: Diagnosis not present

## 2016-07-27 DIAGNOSIS — M6281 Muscle weakness (generalized): Secondary | ICD-10-CM | POA: Diagnosis not present

## 2016-07-27 DIAGNOSIS — R2681 Unsteadiness on feet: Secondary | ICD-10-CM | POA: Diagnosis not present

## 2016-07-27 DIAGNOSIS — I4891 Unspecified atrial fibrillation: Secondary | ICD-10-CM | POA: Diagnosis not present

## 2016-07-27 DIAGNOSIS — J449 Chronic obstructive pulmonary disease, unspecified: Secondary | ICD-10-CM | POA: Diagnosis not present

## 2016-07-28 ENCOUNTER — Other Ambulatory Visit: Payer: Self-pay | Admitting: *Deleted

## 2016-07-28 ENCOUNTER — Telehealth: Payer: Self-pay | Admitting: Cardiovascular Disease

## 2016-07-28 DIAGNOSIS — R2681 Unsteadiness on feet: Secondary | ICD-10-CM | POA: Diagnosis not present

## 2016-07-28 DIAGNOSIS — J449 Chronic obstructive pulmonary disease, unspecified: Secondary | ICD-10-CM | POA: Diagnosis not present

## 2016-07-28 DIAGNOSIS — Z950 Presence of cardiac pacemaker: Secondary | ICD-10-CM | POA: Diagnosis not present

## 2016-07-28 DIAGNOSIS — I110 Hypertensive heart disease with heart failure: Secondary | ICD-10-CM | POA: Diagnosis not present

## 2016-07-28 DIAGNOSIS — I25119 Atherosclerotic heart disease of native coronary artery with unspecified angina pectoris: Secondary | ICD-10-CM | POA: Diagnosis not present

## 2016-07-28 DIAGNOSIS — M6281 Muscle weakness (generalized): Secondary | ICD-10-CM | POA: Diagnosis not present

## 2016-07-28 DIAGNOSIS — I4891 Unspecified atrial fibrillation: Secondary | ICD-10-CM | POA: Diagnosis not present

## 2016-07-28 DIAGNOSIS — I5032 Chronic diastolic (congestive) heart failure: Secondary | ICD-10-CM | POA: Diagnosis not present

## 2016-07-28 MED ORDER — FUROSEMIDE 80 MG PO TABS: 120.0000 mg | ORAL_TABLET | Freq: Every day | ORAL | 1 refills | Status: DC

## 2016-07-28 NOTE — Telephone Encounter (Signed)
Returned call to [place], [facility] nurse w/[facility] Patient is supposed to be getting palliative care, no appt made Patient has increasing SOB, 1+ BLE w/oozing & weeping  -- per nurse, patient is always SOB, breathing was bad last night - couldn't sleep, she is better now Greater than 3lb weight gain in 24 hours Per Glenard Haring, RN - lasix recently increased & also taking metolazone (OV 4/26) Glenard Haring, RN had requested an inhaler on 4/25 for breathing - was advised non-cardiac and patient saw MD on 4/26 and inhaler was no Rx'ed Glenard Haring, RN is not scheduled to go see patient again until next week Patient lives w/daughter  Called patient and spoke with daughter [name] She states [facility] nurse recommended a wrap or boot for oozing - patient is not bed bound.  Per [name], patient is itching really bad Lynda Rainwater that patient should have extra 80mg  lasix (per triage protocol) and the message would be forwarded to MD for further advice.  Of note, [name] was concerned about renal function - last labs >1 month ago.   Message to Dr. Sallyanne Kuster

## 2016-07-28 NOTE — Patient Outreach (Addendum)
[facility]) Care Management  07/28/2016  [name] September 09, 1917 [id number]   Received in basket message regarding follow up on patient palliative care services.   Placed call to hospice and palliative care of [facility], spoke with Inez Catalina, hipaa information verified,  She discussed she had  reached out and left a message  to daughter [name] on last week .Inez Catalina states she will attempt contacting daughter again   Placed follow up call to [name] patient daughter, she reports she has just received a call from [place] from Palliative care, and they plan to visit patient on Tuesday, May 22 at 0930.  Daughter discussed patient had episode of shortness of breath about 4 am and she had to get out of bed to rest better. Daughter discussed breathing is better now, she is just concern about  episode weight gain from 108.2 yesterday to 111.4 today. [name] is also concerned about weeping of fluid right leg and states home health RN has applied a dressing to area on today. Daughter discussed patient currently does not want a hospital bed , but will discuss it again with her. Daughter discussed her goal is making sure  her mother is comfortable,conversation on current condition.  [name] discussed she is familiar with hospice services but not palliative and want to discuss with agency before decisions.   Plan Will plan home visit in the next week .  Will communicate with [name] RN at [name] office update on palliative care consult.  Palliative care consult in home in the next week.    [name], RN, [facility] [phone]- Mobile [phone]- Toll Free Main Office

## 2016-07-28 NOTE — Telephone Encounter (Signed)
Spoke with [name] She agreed w/MD plan to increase lasix dose Rx(s) sent to pharmacy electronically.  Spoke with [name] about palliative care - she called [phone] (palliative care contact) She had missed a previous call regarding a consult Will send message to [facility] nurse Doreene Adas, RN) for assistance

## 2016-07-28 NOTE — Telephone Encounter (Signed)
New message    Home Health calling   Weight today 111.4  - increase sob this am .  1 + edema to her BLE.also oozing suggestion Unna Boots.    Pt C/O Shortness Of Breath: STAT if SOB developed within the last 24 hours or pt is noticeably SOB on the phone  1. Are you currently SOB (can you hear that pt is SOB on the phone)? Home health at home now   2. How long have you been experiencing SOB? Since Last night & again this am   3. Are you SOB when sitting or when up moving around? Per home health - both   4. Are you currently experiencing any other symptoms?  Weight on yesterday  108.2   Vital signs -  110/82, 20, 86- hr, 97.9

## 2016-07-28 NOTE — Telephone Encounter (Signed)
LMTCB  Per chart review [facility] nurse has been in contact w/daughter [name] regarding palliative care services

## 2016-07-28 NOTE — Telephone Encounter (Signed)
[name] called to inform me that patient has palliative care consult on Tuesday 5/22

## 2016-07-28 NOTE — Telephone Encounter (Signed)
Please increase furosemide to 120 mg daily. I agree that her renal function is likely be worse, but cannot really offer treatment that will avoid this. The priority is symptom relief - getting rid of fluid to improve breathing. This cannot be achieved without sacrificing renal function.Can we follow up on her palliative care evaluation? It is time for home hospice. In my opinion.

## 2016-07-29 ENCOUNTER — Ambulatory Visit: Payer: Self-pay | Admitting: *Deleted

## 2016-07-30 DIAGNOSIS — I5032 Chronic diastolic (congestive) heart failure: Secondary | ICD-10-CM | POA: Diagnosis not present

## 2016-07-30 DIAGNOSIS — I4891 Unspecified atrial fibrillation: Secondary | ICD-10-CM | POA: Diagnosis not present

## 2016-07-30 DIAGNOSIS — M6281 Muscle weakness (generalized): Secondary | ICD-10-CM | POA: Diagnosis not present

## 2016-07-30 DIAGNOSIS — I110 Hypertensive heart disease with heart failure: Secondary | ICD-10-CM | POA: Diagnosis not present

## 2016-07-30 DIAGNOSIS — J449 Chronic obstructive pulmonary disease, unspecified: Secondary | ICD-10-CM | POA: Diagnosis not present

## 2016-07-30 DIAGNOSIS — I25119 Atherosclerotic heart disease of native coronary artery with unspecified angina pectoris: Secondary | ICD-10-CM | POA: Diagnosis not present

## 2016-07-30 DIAGNOSIS — R2681 Unsteadiness on feet: Secondary | ICD-10-CM | POA: Diagnosis not present

## 2016-07-30 DIAGNOSIS — Z950 Presence of cardiac pacemaker: Secondary | ICD-10-CM | POA: Diagnosis not present

## 2016-08-03 ENCOUNTER — Other Ambulatory Visit: Payer: Self-pay | Admitting: *Deleted

## 2016-08-03 DIAGNOSIS — R531 Weakness: Secondary | ICD-10-CM | POA: Diagnosis not present

## 2016-08-03 DIAGNOSIS — I25119 Atherosclerotic heart disease of native coronary artery with unspecified angina pectoris: Secondary | ICD-10-CM | POA: Diagnosis not present

## 2016-08-03 DIAGNOSIS — I4891 Unspecified atrial fibrillation: Secondary | ICD-10-CM | POA: Diagnosis not present

## 2016-08-03 DIAGNOSIS — M6281 Muscle weakness (generalized): Secondary | ICD-10-CM | POA: Diagnosis not present

## 2016-08-03 DIAGNOSIS — Z950 Presence of cardiac pacemaker: Secondary | ICD-10-CM | POA: Diagnosis not present

## 2016-08-03 DIAGNOSIS — I110 Hypertensive heart disease with heart failure: Secondary | ICD-10-CM | POA: Diagnosis not present

## 2016-08-03 DIAGNOSIS — I5032 Chronic diastolic (congestive) heart failure: Secondary | ICD-10-CM | POA: Diagnosis not present

## 2016-08-03 DIAGNOSIS — J449 Chronic obstructive pulmonary disease, unspecified: Secondary | ICD-10-CM | POA: Diagnosis not present

## 2016-08-03 DIAGNOSIS — R2681 Unsteadiness on feet: Secondary | ICD-10-CM | POA: Diagnosis not present

## 2016-08-03 NOTE — Patient Outreach (Signed)
[name] [name]) Care Management   08/03/2016  [name] Sep 20, 1917 [id number]  [name] is an 83 y.o. female  Subjective:  Patient reports breathing some better right now , reports awaking about 4 am with some shortness of breath that improved with sitting up.  Patient and daughter discussed visit from [place], NP with [facility] Palliative care on today. Reports she has a better understanding now and her questions have been answered. Patient will be active with Palliative care for now, continue with home health,and  Physical therapy for now  then possible transition to Hospice at some point, is what daughter states is her understanding .  Patient reports appetite is pretty good and she is eating pretty good on most days small amounts. Patient reports using walker to get to bathroom and through out home,    Objective:  BP 108/60 (BP Location: Left Arm, Patient Position: Sitting, Cuff Size: Normal)   Pulse 83   Resp 20   Wt 108 lb 6.4 oz (49.2 kg)   SpO2 95%   BMI 19.83 kg/m   Patient resting in recliner lift chair, in good spirits during visit.  Review of Systems  Constitutional: Negative.   HENT: Negative.        Hard of hearing   Respiratory: Positive for shortness of breath.        Reports shortness of breath at times   Cardiovascular: Negative.   Gastrointestinal: Negative.   Genitourinary: Negative.   Musculoskeletal: Negative for falls.  Skin: Positive for itching.  Neurological: Negative.   Endo/Heme/Allergies: Negative.   Psychiatric/Behavioral: Negative.     Physical Exam  Constitutional: She is oriented to person, place, and time. She appears well-developed and well-nourished.  Cardiovascular: Normal rate.   Respiratory: Effort normal. She has rales.  Fine rales at bases, sleeps with 2 pillows  GI: Soft. Bowel sounds are normal.  Neurological: She is alert and oriented to person, place, and time.  Skin: Skin is warm and dry.  Psychiatric:  She has a normal mood and affect. Her behavior is normal. Judgment and thought content normal.    Encounter Medications:   Outpatient Encounter Prescriptions as of 08/03/2016  Medication Sig  . acetaminophen (TYLENOL) 500 MG tablet Take 1 tablet (500 mg total) by mouth at bedtime.  [name] amiodarone (PACERONE) 200 MG tablet Take 1 tablet (200 mg total) by mouth daily.  . Biotin 1000 MCG tablet Take 1 tablet (1 mg total) by mouth daily. (Patient taking differently: Take 5,000 mcg by mouth daily. )  . Calcium Carbonate-Vitamin D (CALCIUM 600+D) 600-400 MG-UNIT tablet Take 1 tablet by mouth daily.  . carboxymethylcellulose 1 % ophthalmic solution Apply 1 drop to eye 3 (three) times daily. (Patient taking differently: Apply 1 drop to eye 3 (three) times daily as needed (dry eyes). )  . clopidogrel (PLAVIX) 75 MG tablet TAKE 1 TABLET (75 MG TOTAL) BY MOUTH DAILY.  . cycloSPORINE (RESTASIS) 0.05 % ophthalmic emulsion Place 1 drop into both eyes 2 (two) times daily as needed (dryness).   . diphenhydrAMINE (BENADRYL) 25 mg capsule Take 25 mg by mouth at bedtime as needed.  . Docosanol (ABREVA) 10 % CREA Apply 1 application topically 5 (five) times daily. Apply to affected area on lip  5 x day until healed  . feeding supplement (BOOST HIGH PROTEIN) LIQD Take 1 Container by mouth See admin instructions. Drink 1 everyday and another as needed for nutrition supplement.  . furosemide (LASIX) 80 MG tablet Take  1.5 tablets (120 mg total) by mouth daily.  . isosorbide mononitrate (IMDUR) 30 MG 24 hr tablet Take 1 tablet (30 mg total) by mouth daily.  [name] KLOR-CON M10 10 MEQ tablet Take 1 tablet (10 mEq total) by mouth 2 (two) times daily.  [name] levothyroxine (SYNTHROID, LEVOTHROID) 112 MCG tablet Take 1 tablet (112 mcg total) by mouth daily before breakfast.  . metolazone (ZAROXOLYN) 2.5 MG tablet Take 1 tablet (2.5 mg total) by mouth 3 (three) times a week.  . nitroGLYCERIN (NITROSTAT) 0.4 MG SL tablet Place 1 tablet  (0.4 mg total) under the tongue every 5 (five) minutes as needed for chest pain (For a total of 3 tablets, if chest pain persist to call 911).  . ranitidine (ZANTAC) 300 MG capsule Take 1 capsule (300 mg total) by mouth every evening.  [name] rOPINIRole (REQUIP) 0.25 MG tablet Take 1 tablet (0.25 mg total) by mouth at bedtime.  . Vitamin D, Ergocalciferol, (DRISDOL) 50000 units CAPS capsule Take 1 capsule (50,000 Units total) by mouth every 7 (seven) days.   No facility-administered encounter medications on file as of 08/03/2016.     Functional Status:   In your present state of health, do you have any difficulty performing the following activities: 07/02/2016 06/14/2016  Hearing? Tempie Donning  Vision? N N  Difficulty concentrating or making decisions? N N  Walking or climbing stairs? Y N  Dressing or bathing? Y Y  Doing errands, shopping? Y N  Preparing Food and eating ? Y -  Using the Toilet? N -  In the past six months, have you accidently leaked urine? Y -  Do you have problems with loss of bowel control? N -  Managing your Medications? Y -  Managing your Finances? Y -  Housekeeping or managing your Housekeeping? Y -  Some recent data might be hidden    Fall/Depression Screening:    Fall Risk  07/05/2016 04/26/2016 12/09/2014  Falls in the past year? No No No  Number falls in past yr: - - -  Injury with Fall? - - -  Risk for fall due to : Impaired balance/gait;Impaired mobility - -  Risk for fall due to (comments): high fall risk - -   PHQ 2/9 Scores 07/05/2016 04/26/2016 12/09/2014 09/12/2014 08/16/2014 08/01/2014  PHQ - 2 Score 1 0 0 2 1 1   PHQ- 9 Score - - - 4 - -    Assessment:    Josh, NP with [facility] Palliative care , ending his visit on my arrival to home. He will forward his visit note to PCP, which will include follow up plan. Discussed daughter concern of who will be responsible for ordering refill on medication, at this time she will call PCP until  other instruction from PCP/  palliative care coordination. He states nurse from palliative care will call patient regarding next follow up.   Patient continuing with  home health RN and physical therapy services  at this time.     Heart Failure  Continues with swelling mostly in lower legs, home health RN has placed  bandage to right leg due to seeping of fluid . Weight for last 4 days has been 106. 4, today's weight is 108.4, up 2 pounds.  Patient taking lasix and metolazone as prescribed.   Fall Risk No recent fall, continuing the therapy and keeping walker close at all times.    Plan Will follow up with patient in the next 2 weeks by telephone . Reviewed fall prevention  measures Will in basket PCP this visit note.    [name], RN, [facility] [phone]- Mobile [phone]- Toll Free Main Office

## 2016-08-04 ENCOUNTER — Telehealth: Payer: Self-pay | Admitting: Adult Health

## 2016-08-04 DIAGNOSIS — M6281 Muscle weakness (generalized): Secondary | ICD-10-CM | POA: Diagnosis not present

## 2016-08-04 DIAGNOSIS — I4891 Unspecified atrial fibrillation: Secondary | ICD-10-CM | POA: Diagnosis not present

## 2016-08-04 DIAGNOSIS — Z950 Presence of cardiac pacemaker: Secondary | ICD-10-CM | POA: Diagnosis not present

## 2016-08-04 DIAGNOSIS — I25119 Atherosclerotic heart disease of native coronary artery with unspecified angina pectoris: Secondary | ICD-10-CM | POA: Diagnosis not present

## 2016-08-04 DIAGNOSIS — J449 Chronic obstructive pulmonary disease, unspecified: Secondary | ICD-10-CM | POA: Diagnosis not present

## 2016-08-04 DIAGNOSIS — I5032 Chronic diastolic (congestive) heart failure: Secondary | ICD-10-CM | POA: Diagnosis not present

## 2016-08-04 DIAGNOSIS — I110 Hypertensive heart disease with heart failure: Secondary | ICD-10-CM | POA: Diagnosis not present

## 2016-08-04 DIAGNOSIS — R2681 Unsteadiness on feet: Secondary | ICD-10-CM | POA: Diagnosis not present

## 2016-08-04 NOTE — Telephone Encounter (Signed)
Rcvd call from [facility] the [facility] home health provider stating that patient is having SOB mostly in the mornings, extreme swelling with ulcers oozing ([place] request the Rx & use of Unna boots to help w/ swelling. -- states [facility] provider prescribed Lasix 120 MG taken 1 daily -- states pt is not on H2O and may need a Nebulizer or inhalant to assist with breathing-- please call her at [phone] --glh

## 2016-08-04 NOTE — Telephone Encounter (Signed)
Spoke with Glenard Haring with [facility] who states that the patient also has a pressure ulcer on her buttocks as well as LE edema with oozing.  Sanjuan Dame that pt needs to be seen in the office tomorrow, however if SOB is severe or she becomes hypoxic then pt should be seen at ED.    LVM for pt's daughter to call to schedule office visit.    Charyl Bigger, CMA

## 2016-08-05 ENCOUNTER — Other Ambulatory Visit: Payer: Self-pay | Admitting: Cardiovascular Disease

## 2016-08-05 ENCOUNTER — Ambulatory Visit (INDEPENDENT_AMBULATORY_CARE_PROVIDER_SITE_OTHER): Payer: Medicare HMO | Admitting: Adult Health

## 2016-08-05 ENCOUNTER — Encounter: Payer: Self-pay | Admitting: Adult Health

## 2016-08-05 ENCOUNTER — Telehealth: Payer: Self-pay

## 2016-08-05 ENCOUNTER — Other Ambulatory Visit: Payer: Self-pay

## 2016-08-05 VITALS — BP 104/66 | HR 79 | Temp 97.9°F | Ht 62.0 in | Wt 105.0 lb

## 2016-08-05 DIAGNOSIS — R600 Localized edema: Secondary | ICD-10-CM | POA: Diagnosis not present

## 2016-08-05 DIAGNOSIS — R531 Weakness: Secondary | ICD-10-CM

## 2016-08-05 DIAGNOSIS — L299 Pruritus, unspecified: Secondary | ICD-10-CM | POA: Diagnosis not present

## 2016-08-05 DIAGNOSIS — S31829A Unspecified open wound of left buttock, initial encounter: Secondary | ICD-10-CM | POA: Diagnosis not present

## 2016-08-05 MED ORDER — ROPINIROLE HCL 0.25 MG PO TABS: 0.5000 mg | ORAL_TABLET | Freq: Every day | ORAL | 0 refills | Status: AC

## 2016-08-05 NOTE — Assessment & Plan Note (Addendum)
Cards recently adjusted her diuretics which has helped decrease SOB and bil lower ext edema, however both still present. At today's OV: LLE measurement 15 1/2" RLE measurement 15 1/4" Both legs warm to touch with 2+ edema. Excoriation noted on shins from her scratching at night.  No current drainage noted.  Unna boots ordered. Elevate lower extremities as often as possible. Ambulate as tolerated.

## 2016-08-05 NOTE — Telephone Encounter (Signed)
Orders for Unna boot daily and daily wound care to left buttock area given to Yves Dill, RN at [facility].  Charyl Bigger, CMA

## 2016-08-05 NOTE — Assessment & Plan Note (Addendum)
Eat a well balanced diet and stay hydrated. Increase walking, as tolerated.

## 2016-08-05 NOTE — Progress Notes (Signed)
Subjective:    Patient ID: [name], female    DOB: [birth year], 83 y.o.   MRN: [id number]  HPI:  [name] presents with worsening SOB/edema of lower extremities, as well as, increased fatigue/weakness.  She was recently seen by cards and diuretics were adjusted, which did decrease acute CHF sx's, however are still continuing to cause her discomfort.  [facility] has started caring for [name] in her home several days a week. She developed one, small pressure ulcer on L buttock approx 2 weeks ago. She continues to have a healthy appetite and is staying hydrated (she stated "I love Dr. Malachi Bonds).  She isn't moving much and per her daughter at [facility] "she just wants to sit in that chair all day". [name] is alert, smiles, and offers her opinion.  We discussed in great length the multitude of her medical conditions and coupled with her age of 62 that this type of steady decline is not unusual. They feel that Palliative services has been providing beneficial care and referral to Hospice is unwarranted currently. Daughter, [name], at [facility].  Patient Care Team    Relationship Specialty Notifications Start End  Mina Marble D, NP PCP - General Family Medicine  03/25/16   Croitoru, Dani Gobble, MD Consulting Physician Cardiology  03/25/16   Alfonzo Feller, [facility] Care Management  Admissions 06/16/16     Patient Active Problem List   Diagnosis Date Noted  . Itching 08/05/2016  . Abnormal TSH 06/23/2016  . CHF (congestive heart failure) ([place]) 06/14/2016  . COPD (chronic obstructive pulmonary disease) ([place]) 11/29/2015  . Abdominal aortic aneurysm (AAA) without rupture ([place]) 08/21/2015  . DNR (do not resuscitate)   . Weakness generalized   . GERD (gastroesophageal reflux disease) 07/04/2015  . Dysphagia 07/04/2015  . Transaminitis 06/24/2015  . HCAP (healthcare-associated pneumonia) 06/22/2015  . Pneumonia 06/22/2015  . COPD exacerbation ([place]) 06/21/2015  . Cough  06/09/2015  . Fever, unspecified 06/09/2015  . Chronic diastolic congestive heart failure ([place]) 04/24/2015  . Restless legs 04/24/2015  . Pain and swelling of left lower extremity 02/15/2015  . Candidiasis of perineum 02/15/2015  . Hemorrhoids 02/15/2015  . CKD (chronic kidney disease) stage 3, GFR 30-59 ml/min 02/06/2015  . Acute respiratory failure with hypoxia ([place]) 01/29/2015  . Acute pulmonary edema ([place]) 01/29/2015  . SOB (shortness of breath)   . Palliative care encounter   . Sepsis ([place]) 01/26/2015  . Cellulitis of leg, left 01/26/2015  . Protein-calorie malnutrition, severe ([place]) 11/06/2014  . Pressure ulcer 11/06/2014  . Acute diastolic CHF (congestive heart failure) ([place]) 11/04/2014  . Weakness 11/04/2014  . Hypertensive heart disease with CHF (congestive heart failure) ([place]) 11/04/2014  . Macrocytic anemia 11/04/2014  . Cardiomyopathy, ischemic 11/04/2014  . CHF exacerbation ([place]) 11/03/2014  . Body mass index (BMI) of 20.0-20.9 in adult 06/23/2014  . Hard of hearing 06/23/2014  . Rectal bleeding 07/16/2013  . Hyperlipidemia, statin intolerance 04/30/2013  . Persistent atrial fibrillation ([place]) 04/30/2013  . Pacemaker - dual chamber Medtronic 2009 04/30/2013  . AAA (abdominal aortic aneurysm) ([place]) 04/30/2013  . Bilateral carotid artery stenosis 04/30/2013  . PAD (peripheral artery disease) ([place]) 04/30/2013  . Constipation 08/31/2011  . Hypotension 08/28/2011  . S/P total hip arthroplasty 08/28/2011  . CAD (coronary artery disease) 08/28/2011  . Hypothyroid 08/28/2011  . Klebsiella cystitis 08/28/2011  . Thrombocytopenia ([place]) 08/28/2011  . SSS (sick sinus syndrome) ([place]) 10/05/2007     Past Medical History:  Diagnosis  Date  . Abdominal aortic aneurysm ([place])   . Anemia    hx  . Asthma    hx  . Atrial fibrillation ([place])   . Blood transfusion   . Blood transfusion without reported diagnosis   . CAD (coronary artery disease)   . CHF (congestive heart  failure) ([place])   . COPD (chronic obstructive pulmonary disease) ([place])   . GERD (gastroesophageal reflux disease)   . GI bleed 2007  . H/O hiatal hernia   . Heart attack ([place])   . Heart disease   . History of right hip replacement 2013  . Hyperlipidemia   . Hypertension   . Hypothyroidism   . Myocardial infarction ([place])   . Osteoporosis   . Pacemaker   . Renal failure, chronic   . SSS (sick sinus syndrome) ([place]) 10/05/2007   Medtronic Adapta  . Stroke ([place])   . TIA (transient ischemic attack)   . UTI (lower urinary tract infection)    hx     Past Surgical History:  Procedure Laterality Date  . CARDIAC CATHETERIZATION    . COLONOSCOPY    . CORONARY STENT PLACEMENT     x9  . FRACTURE SURGERY     femur fx, /w ORIF into HIP- 2008  . JOINT REPLACEMENT    . NM MYOCAR PERF WALL MOTION  10/08/2010   mild apical anterior ischemia  . PACEMAKER INSERTION  10/05/2007   Medtronic Adapta  . TOTAL HIP ARTHROPLASTY     planned for 08/27/2011 Left  . TOTAL HIP ARTHROPLASTY  08/27/2011   Procedure: TOTAL HIP ARTHROPLASTY;  Surgeon: Kerin Salen, MD;  Location: [place];  Service: Orthopedics;  Laterality: Right;  . US ECHOCARDIOGRAPHY  08/23/2011   EF 50%,LA mod. dilated,mild to mod. mitral annular ca+,mod. MR,mild to mod TR,mild to mod. PH,trace AI.     Family History  Problem Relation Age of Onset  . Emphysema Father   . Heart disease Mother   . Cancer Brother   . Cancer Brother   . Cancer Sister   . Cancer Sister   . Diabetes Daughter   . Atrial fibrillation Daughter   . Atrial fibrillation Daughter   . Stroke Daughter   . Heart Problems Son      History  Drug Use No     History  Alcohol Use No     History  Smoking Status  . Former Smoker  . Types: Cigarettes  Smokeless Tobacco  . Never Used    Comment: quit 40 yrs ago- was smoking 2 packs a day at the most     Outpatient Encounter Prescriptions as of 08/05/2016  Medication Sig  . acetaminophen (TYLENOL) 500  MG tablet Take 1 tablet (500 mg total) by mouth at bedtime.  [name] amiodarone (PACERONE) 200 MG tablet Take 1 tablet (200 mg total) by mouth daily.  . Biotin 1000 MCG tablet Take 1 tablet (1 mg total) by mouth daily. (Patient taking differently: Take 5,000 mcg by mouth daily. )  . Calcium Carbonate-Vitamin D (CALCIUM 600+D) 600-400 MG-UNIT tablet Take 1 tablet by mouth daily.  . carboxymethylcellulose 1 % ophthalmic solution Apply 1 drop to eye 3 (three) times daily. (Patient taking differently: Apply 1 drop to eye 3 (three) times daily as needed (dry eyes). )  . clopidogrel (PLAVIX) 75 MG tablet TAKE 1 TABLET (75 MG TOTAL) BY MOUTH DAILY.  . cycloSPORINE (RESTASIS) 0.05 % ophthalmic emulsion Place 1 drop into both eyes 2 (two) times daily as  needed (dryness).   . diphenhydrAMINE (BENADRYL) 25 mg capsule Take 25 mg by mouth at bedtime as needed.  . Docosanol (ABREVA) 10 % CREA Apply 1 application topically 5 (five) times daily. Apply to affected area on lip  5 x day until healed  . feeding supplement (BOOST HIGH PROTEIN) LIQD Take 1 Container by mouth See admin instructions. Drink 1 everyday and another as needed for nutrition supplement.  . furosemide (LASIX) 80 MG tablet Take 1.5 tablets (120 mg total) by mouth daily.  . isosorbide mononitrate (IMDUR) 30 MG 24 hr tablet Take 1 tablet (30 mg total) by mouth daily.  [name] KLOR-CON M10 10 MEQ tablet Take 1 tablet (10 mEq total) by mouth 2 (two) times daily.  [name] levothyroxine (SYNTHROID, LEVOTHROID) 112 MCG tablet Take 1 tablet (112 mcg total) by mouth daily before breakfast.  . metolazone (ZAROXOLYN) 2.5 MG tablet Take 1 tablet (2.5 mg total) by mouth 3 (three) times a week.  . nitroGLYCERIN (NITROSTAT) 0.4 MG SL tablet Place 1 tablet (0.4 mg total) under the tongue every 5 (five) minutes as needed for chest pain (For a total of 3 tablets, if chest pain persist to call 911).  . ranitidine (ZANTAC) 300 MG capsule Take 1 capsule (300 mg total) by mouth every  evening.  . Vitamin D, Ergocalciferol, (DRISDOL) 50000 units CAPS capsule Take 1 capsule (50,000 Units total) by mouth every 7 (seven) days.  . [DISCONTINUED] rOPINIRole (REQUIP) 0.25 MG tablet Take 1 tablet (0.25 mg total) by mouth at bedtime.  [name] rOPINIRole (REQUIP) 0.25 MG tablet Take 2 tablets by mouth at bedtime.   No facility-administered encounter medications on file as of 08/05/2016.     Allergies: Nubain [nalbuphine hcl]; Pineapple; Iodine; Mirtazapine; and Statins  Body mass index is 19.2 kg/m.  Blood pressure 104/66, pulse 79, temperature 97.9 F (36.6 C), temperature source Oral, height 5\' 2"  (1.575 m), weight 105 lb (47.6 kg), SpO2 94 %.    Review of Systems  Constitutional: Positive for activity change and fatigue. Negative for appetite change, chills, diaphoresis, fever and unexpected weight change.  Eyes: Positive for visual disturbance.  Respiratory: Negative for cough, chest tightness, shortness of breath, wheezing and stridor.   Cardiovascular: Positive for leg swelling. Negative for chest pain and palpitations.  Gastrointestinal: Positive for abdominal distention. Negative for abdominal pain, blood in stool, constipation, diarrhea, nausea and vomiting.  Endocrine: Negative for cold intolerance, heat intolerance, polydipsia, polyphagia and polyuria.  Genitourinary: Negative for difficulty urinating and flank pain.  Musculoskeletal: Positive for arthralgias, back pain, gait problem, joint swelling, myalgias, neck pain and neck stiffness.  Skin: Positive for wound. Negative for color change, pallor and rash.  Neurological: Positive for dizziness and weakness. Negative for tremors and headaches.  Hematological: Bruises/bleeds easily.  Psychiatric/Behavioral: Negative for agitation, dysphoric mood and sleep disturbance. The patient is not nervous/anxious.        Objective:   Physical Exam  Constitutional: She appears well-developed and well-nourished. No distress.   Cardiovascular: Normal rate, regular rhythm, normal heart sounds and intact distal pulses.   No murmur heard. Pulmonary/Chest: Effort normal. No respiratory distress. She has decreased breath sounds in the right lower field and the left lower field. She has no wheezes. She has no rales. She exhibits no tenderness.  Abdominal: Bowel sounds are normal. She exhibits no distension. There is no CVA tenderness.  Genitourinary:  Genitourinary Comments: Pressure ulcer Nickle sized stage 1 decubitus ulcer on left buttock. Very shallow, no drainage/excessive warmth noted.  Dressing changed in clinic  Musculoskeletal: She exhibits edema and tenderness.       Right lower leg: She exhibits tenderness, swelling and edema.       Left lower leg: She exhibits tenderness, swelling and edema.   LLE measurement 15 1/2" RLE measurement 15 1/4" Both legs warm to touch with 2+ edema. Excoriation noted on shins from her scratching at night.  No current drainage noted.      Neurological: She is alert.  Skin: Skin is warm and dry. No rash noted. She is not diaphoretic. There is erythema. No pallor.  Psychiatric: She has a normal mood and affect. Her behavior is normal. Judgment and thought content normal.          Assessment & Plan:   1. Edema of both legs   2. Wound of left buttock, initial encounter   3. Weakness   4. Itching     CHF exacerbation Cards recently adjusted her diuretics which has helped decrease SOB and bil lower ext edema, however both still present. At today's OV: LLE measurement 15 1/2" RLE measurement 15 1/4" Both legs warm to touch with 2+ edema. Excoriation noted on shins from her scratching at night.  No current drainage noted.  Unna boots ordered. Elevate lower extremities as often as possible. Ambulate as tolerated.  Pressure ulcer Nickle sized stage 1 decubitus ulcer on left buttock. Very shallow, no drainage/excessive warmth noted. Dressing changed in clinic,  wound care referral placed. Rotate from side/side when sitting, also recommend a foam wedge to alleviate pressure from sacral area. Maintain healthy diet.  Weakness Eat a well balanced diet and stay hydrated. Increase walking, as tolerated.   Itching Generalized pruritus that flares at night. Start OTC antihistamine in am and continue HS dose of diphenhydramine 25 mg.    FOLLOW-UP:  Return if symptoms worsen or fail to improve.

## 2016-08-05 NOTE — Assessment & Plan Note (Addendum)
Nickle sized stage 1 decubitus ulcer on left buttock. Very shallow, no drainage/excessive warmth noted. Dressing changed in clinic, wound care referral placed. Rotate from side/side when sitting, also recommend a foam wedge to alleviate pressure from sacral area. Maintain healthy diet.

## 2016-08-05 NOTE — Telephone Encounter (Signed)
Pt scheduled to for OV today at 10:30 AM.  Charyl Bigger, CMA

## 2016-08-05 NOTE — Assessment & Plan Note (Signed)
Generalized pruritus that flares at night. Start OTC antihistamine in am and continue HS dose of diphenhydramine 25 mg.

## 2016-08-05 NOTE — Patient Instructions (Addendum)
Heart Failure Heart failure means your heart has trouble pumping blood. This makes it hard for your body to work well. Heart failure is usually a long-term (chronic) condition. You must take good care of yourself and follow your doctor's treatment plan. Follow these instructions at home:  Take your heart medicine as told by your doctor.  Do not stop taking medicine unless your doctor tells you to.  Do not skip any dose of medicine.  Refill your medicines before they run out.  Take other medicines only as told by your doctor or pharmacist.  Stay active if told by your doctor. The elderly and people with severe heart failure should talk with a doctor about physical activity.  Eat heart-healthy foods. Choose foods that are without trans fat and are low in saturated fat, cholesterol, and salt (sodium). This includes fresh or frozen fruits and vegetables, fish, lean meats, fat-free or low-fat dairy foods, whole grains, and high-fiber foods. Lentils and dried peas and beans (legumes) are also good choices.  Limit salt if told by your doctor.  Cook in a healthy way. Roast, grill, broil, bake, poach, steam, or stir-fry foods.  Limit fluids as told by your doctor.  Weigh yourself every morning. Do this after you pee (urinate) and before you eat breakfast. Write down your weight to give to your doctor.  Take your blood pressure and write it down if your doctor tells you to.  Ask your doctor how to check your pulse. Check your pulse as told.  Lose weight if told by your doctor.  Stop smoking or chewing tobacco. Do not use gum or patches that help you quit without your doctor's approval.  Schedule and go to doctor visits as told.  Nonpregnant women should have no more than 1 drink a day. Men should have no more than 2 drinks a day. Talk to your doctor about drinking alcohol.  Stop illegal drug use.  Stay current with shots (immunizations).  Manage your health conditions as told by your  doctor.  Learn to manage your stress.  Rest when you are tired.  If it is really hot outside:  Avoid intense activities.  Use air conditioning or fans, or get in a cooler place.  Avoid caffeine and alcohol.  Wear loose-fitting, lightweight, and light-colored clothing.  If it is really cold outside:  Avoid intense activities.  Layer your clothing.  Wear mittens or gloves, a hat, and a scarf when going outside.  Avoid alcohol.  Learn about heart failure and get support as needed.  Get help to maintain or improve your quality of life and your ability to care for yourself as needed. Contact a doctor if:  You gain weight quickly.  You are more short of breath than usual.  You cannot do your normal activities.  You tire easily.  You cough more than normal, especially with activity.  You have any or more puffiness (swelling) in areas such as your hands, feet, ankles, or belly (abdomen).  You cannot sleep because it is hard to breathe.  You feel like your heart is beating fast (palpitations).  You get dizzy or light-headed when you stand up. Get help right away if:  You have trouble breathing.  There is a change in mental status, such as becoming less alert or not being able to focus.  You have chest pain or discomfort.  You faint. This information is not intended to replace advice given to you by your health care provider. Make sure you  discuss any questions you have with your health care provider. Document Released: 12/09/2007 Document Revised: 08/07/2015 Document Reviewed: 04/17/2012 Elsevier Interactive Patient Education  2017 [place].   Wound care referral placed. Unna boots order placed. Please take morning OTC Antihistamine and continue evening dose of OTC Benadryl 25mg . To relieve pressure on buttocks, move from side to side often. Keep lower extremities elevated as often as possible. Stand up and walk every hour or so, as tolerated. Please  call your cardiologist if swelling in legs worsen or weight gain noted. Please call clinic with any questions/concerns.

## 2016-08-06 ENCOUNTER — Telehealth: Payer: Self-pay | Admitting: Adult Health

## 2016-08-06 ENCOUNTER — Telehealth: Payer: Self-pay | Admitting: Cardiology

## 2016-08-06 ENCOUNTER — Ambulatory Visit (INDEPENDENT_AMBULATORY_CARE_PROVIDER_SITE_OTHER): Payer: Self-pay | Admitting: *Deleted

## 2016-08-06 DIAGNOSIS — Z950 Presence of cardiac pacemaker: Secondary | ICD-10-CM | POA: Diagnosis not present

## 2016-08-06 DIAGNOSIS — R2681 Unsteadiness on feet: Secondary | ICD-10-CM | POA: Diagnosis not present

## 2016-08-06 DIAGNOSIS — M6281 Muscle weakness (generalized): Secondary | ICD-10-CM | POA: Diagnosis not present

## 2016-08-06 DIAGNOSIS — I495 Sick sinus syndrome: Secondary | ICD-10-CM

## 2016-08-06 DIAGNOSIS — J449 Chronic obstructive pulmonary disease, unspecified: Secondary | ICD-10-CM | POA: Diagnosis not present

## 2016-08-06 DIAGNOSIS — I25119 Atherosclerotic heart disease of native coronary artery with unspecified angina pectoris: Secondary | ICD-10-CM | POA: Diagnosis not present

## 2016-08-06 DIAGNOSIS — I110 Hypertensive heart disease with heart failure: Secondary | ICD-10-CM | POA: Diagnosis not present

## 2016-08-06 DIAGNOSIS — I5032 Chronic diastolic (congestive) heart failure: Secondary | ICD-10-CM | POA: Diagnosis not present

## 2016-08-06 DIAGNOSIS — I4891 Unspecified atrial fibrillation: Secondary | ICD-10-CM | POA: Diagnosis not present

## 2016-08-06 MED ORDER — CLOPIDOGREL BISULFATE 75 MG PO TABS: ORAL_TABLET | ORAL | 1 refills | Status: AC

## 2016-08-06 NOTE — Telephone Encounter (Signed)
LMOVM for pt to return call. Pt device reached ERI on 07-17-16.

## 2016-08-06 NOTE — Telephone Encounter (Signed)
Rcvd call frm [place] @ [facility] stating Medicare will not approve daily dressing changes w/ Unna boot for patient she will be sending and new order to reduce dressing changes to twice weekly. -- any question pls call [phone]. --glh

## 2016-08-06 NOTE — Telephone Encounter (Signed)
Ms. Merrilyn Puma returning phone call to [facility], informed her that pts device had reached ERI and needed an apt with Dr. Sallyanne Kuster to discuss generator change. Informed Ms. Merrilyn Puma that pt already has apt scheduled for 08/18/16 at 10:40am, and that he would discuss the surgery at that appointment.

## 2016-08-06 NOTE — Telephone Encounter (Signed)
REFILL 

## 2016-08-06 NOTE — Telephone Encounter (Signed)
Refill sent to the pharmacy electronically.  

## 2016-08-06 NOTE — Progress Notes (Signed)
Remote pacemaker transmission.   

## 2016-08-06 NOTE — Telephone Encounter (Signed)
Patient friend called requesting that plavix be sent to [facility] at [facility] center.

## 2016-08-10 DIAGNOSIS — I4891 Unspecified atrial fibrillation: Secondary | ICD-10-CM | POA: Diagnosis not present

## 2016-08-10 DIAGNOSIS — Z950 Presence of cardiac pacemaker: Secondary | ICD-10-CM | POA: Diagnosis not present

## 2016-08-10 DIAGNOSIS — I5032 Chronic diastolic (congestive) heart failure: Secondary | ICD-10-CM | POA: Diagnosis not present

## 2016-08-10 DIAGNOSIS — R2681 Unsteadiness on feet: Secondary | ICD-10-CM | POA: Diagnosis not present

## 2016-08-10 DIAGNOSIS — I110 Hypertensive heart disease with heart failure: Secondary | ICD-10-CM | POA: Diagnosis not present

## 2016-08-10 DIAGNOSIS — M6281 Muscle weakness (generalized): Secondary | ICD-10-CM | POA: Diagnosis not present

## 2016-08-10 DIAGNOSIS — J449 Chronic obstructive pulmonary disease, unspecified: Secondary | ICD-10-CM | POA: Diagnosis not present

## 2016-08-10 DIAGNOSIS — I25119 Atherosclerotic heart disease of native coronary artery with unspecified angina pectoris: Secondary | ICD-10-CM | POA: Diagnosis not present

## 2016-08-10 LAB — CUP PACEART REMOTE DEVICE CHECK
Brady Statistic RV Percent Paced: 13 %
Implantable Lead Implant Date: 20090723
Implantable Lead Location: 753859
Lead Channel Impedance Value: 501 Ohm
Lead Channel Setting Pacing Pulse Width: 0.46 ms
Lead Channel Setting Sensing Sensitivity: 4 mV
MDC IDC LEAD IMPLANT DT: 20090723
MDC IDC LEAD LOCATION: 753860
MDC IDC MSMT BATTERY IMPEDANCE: 8731 Ohm
MDC IDC MSMT BATTERY VOLTAGE: 2.62 V
MDC IDC MSMT LEADCHNL RA IMPEDANCE VALUE: 67 Ohm
MDC IDC PG IMPLANT DT: 20090723
MDC IDC SESS DTM: 20180525165443
MDC IDC SET LEADCHNL RV PACING AMPLITUDE: 2.5 V

## 2016-08-10 NOTE — Telephone Encounter (Signed)
Good Morning Tonya, Please see note. Thanks! Valetta Fuller

## 2016-08-11 ENCOUNTER — Telehealth: Payer: Self-pay

## 2016-08-11 LAB — CUP PACEART INCLINIC DEVICE CHECK
Battery Voltage: 2.61 V
Brady Statistic AP VS Percent: 74 %
Brady Statistic AS VP Percent: 4 %
Brady Statistic AS VS Percent: 19 %
Date Time Interrogation Session: 20180426163611
Implantable Lead Implant Date: 20090723
Implantable Lead Location: 753859
Implantable Lead Location: 753860
Implantable Lead Model: 5076
Implantable Lead Model: 5076
Lead Channel Pacing Threshold Amplitude: 0.75 V
Lead Channel Pacing Threshold Amplitude: 0.75 V
Lead Channel Pacing Threshold Pulse Width: 0.4 ms
Lead Channel Pacing Threshold Pulse Width: 0.4 ms
Lead Channel Setting Pacing Pulse Width: 0.4 ms
MDC IDC LEAD IMPLANT DT: 20090723
MDC IDC MSMT BATTERY IMPEDANCE: 7897 Ohm
MDC IDC MSMT BATTERY REMAINING LONGEVITY: 1 mo — AB
MDC IDC MSMT LEADCHNL RA IMPEDANCE VALUE: 566 Ohm
MDC IDC MSMT LEADCHNL RV IMPEDANCE VALUE: 510 Ohm
MDC IDC PG IMPLANT DT: 20090723
MDC IDC SET LEADCHNL RA PACING AMPLITUDE: 1.5 V
MDC IDC SET LEADCHNL RV PACING AMPLITUDE: 2 V
MDC IDC SET LEADCHNL RV SENSING SENSITIVITY: 4 mV
MDC IDC STAT BRADY AP VP PERCENT: 2 %

## 2016-08-11 NOTE — Telephone Encounter (Signed)
Faxed questionnaire to [name] with the [facility] division of [employer] regarding a carelink transmission performed 07/08/16. Questions answered and faxed back on 08/06/16. See scanned document for details.

## 2016-08-11 NOTE — Telephone Encounter (Signed)
Faxed signed orders regard skill nursing services and physical therapy for 07/21/16 on 07/30/16 and 07/23/16 on 08/06/16.

## 2016-08-12 DIAGNOSIS — Z950 Presence of cardiac pacemaker: Secondary | ICD-10-CM | POA: Diagnosis not present

## 2016-08-12 DIAGNOSIS — I5032 Chronic diastolic (congestive) heart failure: Secondary | ICD-10-CM | POA: Diagnosis not present

## 2016-08-12 DIAGNOSIS — R2681 Unsteadiness on feet: Secondary | ICD-10-CM | POA: Diagnosis not present

## 2016-08-12 DIAGNOSIS — M6281 Muscle weakness (generalized): Secondary | ICD-10-CM | POA: Diagnosis not present

## 2016-08-12 DIAGNOSIS — I25119 Atherosclerotic heart disease of native coronary artery with unspecified angina pectoris: Secondary | ICD-10-CM | POA: Diagnosis not present

## 2016-08-12 DIAGNOSIS — I4891 Unspecified atrial fibrillation: Secondary | ICD-10-CM | POA: Diagnosis not present

## 2016-08-12 DIAGNOSIS — I110 Hypertensive heart disease with heart failure: Secondary | ICD-10-CM | POA: Diagnosis not present

## 2016-08-12 DIAGNOSIS — J449 Chronic obstructive pulmonary disease, unspecified: Secondary | ICD-10-CM | POA: Diagnosis not present

## 2016-08-13 DIAGNOSIS — I5032 Chronic diastolic (congestive) heart failure: Secondary | ICD-10-CM | POA: Diagnosis not present

## 2016-08-13 DIAGNOSIS — J449 Chronic obstructive pulmonary disease, unspecified: Secondary | ICD-10-CM | POA: Diagnosis not present

## 2016-08-13 DIAGNOSIS — I110 Hypertensive heart disease with heart failure: Secondary | ICD-10-CM | POA: Diagnosis not present

## 2016-08-13 DIAGNOSIS — R2681 Unsteadiness on feet: Secondary | ICD-10-CM | POA: Diagnosis not present

## 2016-08-13 DIAGNOSIS — I25119 Atherosclerotic heart disease of native coronary artery with unspecified angina pectoris: Secondary | ICD-10-CM | POA: Diagnosis not present

## 2016-08-13 DIAGNOSIS — M6281 Muscle weakness (generalized): Secondary | ICD-10-CM | POA: Diagnosis not present

## 2016-08-13 DIAGNOSIS — I4891 Unspecified atrial fibrillation: Secondary | ICD-10-CM | POA: Diagnosis not present

## 2016-08-13 DIAGNOSIS — Z950 Presence of cardiac pacemaker: Secondary | ICD-10-CM | POA: Diagnosis not present

## 2016-08-16 ENCOUNTER — Telehealth: Payer: Self-pay | Admitting: Adult Health

## 2016-08-16 DIAGNOSIS — M6281 Muscle weakness (generalized): Secondary | ICD-10-CM | POA: Diagnosis not present

## 2016-08-16 DIAGNOSIS — J449 Chronic obstructive pulmonary disease, unspecified: Secondary | ICD-10-CM | POA: Diagnosis not present

## 2016-08-16 DIAGNOSIS — I110 Hypertensive heart disease with heart failure: Secondary | ICD-10-CM | POA: Diagnosis not present

## 2016-08-16 DIAGNOSIS — Z950 Presence of cardiac pacemaker: Secondary | ICD-10-CM | POA: Diagnosis not present

## 2016-08-16 DIAGNOSIS — I4891 Unspecified atrial fibrillation: Secondary | ICD-10-CM | POA: Diagnosis not present

## 2016-08-16 DIAGNOSIS — I5032 Chronic diastolic (congestive) heart failure: Secondary | ICD-10-CM | POA: Diagnosis not present

## 2016-08-16 DIAGNOSIS — R2681 Unsteadiness on feet: Secondary | ICD-10-CM | POA: Diagnosis not present

## 2016-08-16 DIAGNOSIS — I25119 Atherosclerotic heart disease of native coronary artery with unspecified angina pectoris: Secondary | ICD-10-CM | POA: Diagnosis not present

## 2016-08-16 NOTE — Telephone Encounter (Signed)
Rcvd called frm Evette Georges stating she will be re-authorizaing  [place] for Ms. Picklesimer-- Pls call her with any questions [phone]. --glh

## 2016-08-17 ENCOUNTER — Other Ambulatory Visit: Payer: Self-pay | Admitting: *Deleted

## 2016-08-17 DIAGNOSIS — J449 Chronic obstructive pulmonary disease, unspecified: Secondary | ICD-10-CM | POA: Diagnosis not present

## 2016-08-17 DIAGNOSIS — I25119 Atherosclerotic heart disease of native coronary artery with unspecified angina pectoris: Secondary | ICD-10-CM | POA: Diagnosis not present

## 2016-08-17 DIAGNOSIS — R2681 Unsteadiness on feet: Secondary | ICD-10-CM | POA: Diagnosis not present

## 2016-08-17 DIAGNOSIS — Z950 Presence of cardiac pacemaker: Secondary | ICD-10-CM | POA: Diagnosis not present

## 2016-08-17 DIAGNOSIS — M6281 Muscle weakness (generalized): Secondary | ICD-10-CM | POA: Diagnosis not present

## 2016-08-17 DIAGNOSIS — I4891 Unspecified atrial fibrillation: Secondary | ICD-10-CM | POA: Diagnosis not present

## 2016-08-17 DIAGNOSIS — I5032 Chronic diastolic (congestive) heart failure: Secondary | ICD-10-CM | POA: Diagnosis not present

## 2016-08-17 DIAGNOSIS — I110 Hypertensive heart disease with heart failure: Secondary | ICD-10-CM | POA: Diagnosis not present

## 2016-08-17 NOTE — Patient Outreach (Addendum)
[facility]) Care Management  08/17/2016  [name] 22-Oct-1917 [id number]  Telephone follow up call  [phone] Placed call to [name] , unsuccessful able to leave a hipaa compliant message requesting a return call.  1140 Return call from [name], she reports that patient weight got down to 99 over the weekend and she cut back on her lasix to 80 mg instead of 120 mg  and today's weight is 102.6 denies patient increased shortness of breath. Patient still has some edema in lower legs, home health RN removed Unna boot dressing to legs on yesterday, daughter discussed legs look better today.   Daughter discussed upcoming visit to cardiology on tomorrow and she will discuss if patient strong enough to under go pacemaker generator change that will be due in a few months.   Social  Daughter discussed  she plans to discuss with palliative care respite services for when she is planning to be out of town as she provides 24 hour care for patient and other family may not be  available.  Patient is currently still followed by home health RN and physical therapy services and has completed initial palliative care consult with Hosp[facility] and palliative care of [facility].    Plan Will follow up with patient in the next 2 weeks with a home visit ,for continued complex care management needs. Will discuss options for respite care  with [facility] social worker and update caregiver as needed by telephone .  [name], RN, [place] [facility] [phone]- Mobile [phone]- Toll Free Main Office

## 2016-08-18 ENCOUNTER — Other Ambulatory Visit: Payer: Self-pay | Admitting: Cardiovascular Disease

## 2016-08-18 ENCOUNTER — Ambulatory Visit (INDEPENDENT_AMBULATORY_CARE_PROVIDER_SITE_OTHER): Payer: Medicare HMO | Admitting: Cardiovascular Disease

## 2016-08-18 ENCOUNTER — Encounter: Payer: Self-pay | Admitting: Cardiovascular Disease

## 2016-08-18 VITALS — BP 105/60 | HR 91 | Ht 62.0 in | Wt 99.2 lb

## 2016-08-18 DIAGNOSIS — I714 Abdominal aortic aneurysm, without rupture, unspecified: Secondary | ICD-10-CM

## 2016-08-18 DIAGNOSIS — I4819 Other persistent atrial fibrillation: Secondary | ICD-10-CM

## 2016-08-18 DIAGNOSIS — I481 Persistent atrial fibrillation: Secondary | ICD-10-CM

## 2016-08-18 DIAGNOSIS — I25118 Atherosclerotic heart disease of native coronary artery with other forms of angina pectoris: Secondary | ICD-10-CM | POA: Diagnosis not present

## 2016-08-18 DIAGNOSIS — I5032 Chronic diastolic (congestive) heart failure: Secondary | ICD-10-CM

## 2016-08-18 DIAGNOSIS — Z45010 Encounter for checking and testing of cardiac pacemaker pulse generator [battery]: Secondary | ICD-10-CM

## 2016-08-18 DIAGNOSIS — Z515 Encounter for palliative care: Secondary | ICD-10-CM | POA: Diagnosis not present

## 2016-08-18 MED ORDER — AMIODARONE HCL 100 MG PO TABS: 100.0000 mg | ORAL_TABLET | Freq: Every day | ORAL | 3 refills | Status: AC

## 2016-08-18 NOTE — Progress Notes (Signed)
Patient ID: [name], female   DOB: Feb 29, 1918, 83 y.o.   MRN: [id number]    Cardiology Office Note    Date:  08/18/2016   ID:  [name], DOB 1917/05/30, MRN [id number]  PCP:  Brandy Grandchild, NP  Cardiologist:   Brandy Klein, MD   Chief Complaint  Patient presents with  . Follow-up    History of Present Illness:  [name] is a 83 y.o. female with coronary artery disease, Long-term persistent atrial fibrillation, previous TIA, sinus node dysfunction and a dual-chamber pacemaker, chronic diastolic heart failure, CAD, COPD, hypertension and hypothyroidism returning for follow-up.  She is here to discuss the fact that her pacemaker reached ERI 07/17/2016. She has now been in persistent atrial fibrillation for well over a year, probably longer since there was under sensing of atrial fibrillation with previous settings. Her pacemaker was implanted for sinus node dysfunction with tachycardia-bradycardia syndrome. She has never had problems with atrioventricular block. With current pacemaker settings (device is now mode switch to VVI at 65 bpm) she has 13% ventricular pacing with a fairly normal heart rate histogram distribution. High ventricular rates are not seen frequently. The average ventricular rate is in the 70s  She continues to have problems with leg edema, but shortness of breath has not been a big complaint. She does not have orthopnea or PND. Her family has been adjusting her furosemide dose between 80 mg in the 120 mg daily depending on the degree of edema. Her feet are swollen but she does not have skin weeping or any blisters. She has swelling roughly 2/3 of the way up to her knees..  CAD: In 1997 she underwent urgent stenting of the proximal LAD artery for NSTEMI and then underwent a staged stent to the left circumflex coronary artery, complicated by frequent nonsustained ventricular tachycardia. A planned staged intervention to the right coronary artery was performed  later the same year.  She required repeat stenting of all 3 coronary arteries in 2006, when she presented with unstable angina. These procedures were performed on 3 consecutive days and a rather complicated fashion. Drug-eluting stents were placed in the proximal LAD and proximal RCA, with bare-metal stents in the proximal left circumflex and mid LAD. There was residual 50% stenosis of the left main coronary artery, but she has not required any revascularization procedure since.  Her last nuclear stress test performed in 2012 and showed low risk findings.   CHF: she bears a diagnosis of diastolic heart failure and takes a very low dose of loop diuretic. Attempts to discontinue her furosemide completely led to marked edema and it was restarted.  PAD: She has an infrarenal abdominal aortic aneurysm has been recognized for 18 years without much progression. Most recent ultrasound in May of 2015 measured at 3.8x3.8 cm. Has received a previous stent to the right common iliac artery and has mild bilateral stenoses in both iliac arteries by her most recent ultrasound performed in 2014. She does not have intermittent claudication. Bilateral ABIs were greater than 1.0. Stable <50% bilateral internal carotid artery lesions, also unchanged for several years.   Arrhythmia: She has a history of sinus node dysfunction and paroxysmal atrial fibrillation and received a dual-chamber Medtronic adapta pacemaker in 2009. She failed multaq antiarrhythmic therapy and is now receiving amiodarone in a low dose. She has been in persistent atrial fibrillation now for about a year (possibly longer since there was some atrial under sensing).  Risk factors: She has been  intolerant to numerous statins including pravastatin, atorvastatin, simvastatin and daily or weekly Crestor.   Significant comorbidities also include treated hypothyroidism. She does not have diabetes mellitus and quit smoking decades ago. She has had  multiple repeat surgeries for her left hip with the most recent revision being in June of 2013. She uses a walker and occasionally gets around with a cane.   Past Medical History:  Diagnosis Date  . Abdominal aortic aneurysm ([place])   . Anemia    hx  . Asthma    hx  . Atrial fibrillation ([place])   . Blood transfusion   . Blood transfusion without reported diagnosis   . CAD (coronary artery disease)   . CHF (congestive heart failure) ([place])   . COPD (chronic obstructive pulmonary disease) ([place])   . GERD (gastroesophageal reflux disease)   . GI bleed 2007  . H/O hiatal hernia   . Heart attack ([place])   . Heart disease   . History of right hip replacement 2013  . Hyperlipidemia   . Hypertension   . Hypothyroidism   . Myocardial infarction ([place])   . Osteoporosis   . Pacemaker   . Renal failure, chronic   . SSS (sick sinus syndrome) ([place]) 10/05/2007   Medtronic Adapta  . Stroke ([place])   . TIA (transient ischemic attack)   . UTI (lower urinary tract infection)    hx    Past Surgical History:  Procedure Laterality Date  . CARDIAC CATHETERIZATION    . COLONOSCOPY    . CORONARY STENT PLACEMENT     x9  . FRACTURE SURGERY     femur fx, /w ORIF into HIP- 2008  . JOINT REPLACEMENT    . NM MYOCAR PERF WALL MOTION  10/08/2010   mild apical anterior ischemia  . PACEMAKER INSERTION  10/05/2007   Medtronic Adapta  . TOTAL HIP ARTHROPLASTY     planned for 08/27/2011 Left  . TOTAL HIP ARTHROPLASTY  08/27/2011   Procedure: TOTAL HIP ARTHROPLASTY;  Surgeon: Kerin Salen, MD;  Location: [facility];  Service: Orthopedics;  Laterality: Right;  . US ECHOCARDIOGRAPHY  08/23/2011   EF 50%,LA mod. dilated,mild to mod. mitral annular ca+,mod. MR,mild to mod TR,mild to mod. PH,trace AI.    Current Medications: Outpatient Medications Prior to Visit  Medication Sig Dispense Refill  . acetaminophen (TYLENOL) 500 MG tablet Take 1 tablet (500 mg total) by mouth at bedtime. 30 tablet 2  . Biotin 1000 MCG  tablet Take 1 tablet (1 mg total) by mouth daily. (Patient taking differently: Take 5,000 mcg by mouth daily. ) 90 tablet 2  . Calcium Carbonate-Vitamin D (CALCIUM 600+D) 600-400 MG-UNIT tablet Take 1 tablet by mouth daily. 90 tablet 2  . carboxymethylcellulose 1 % ophthalmic solution Apply 1 drop to eye 3 (three) times daily. (Patient taking differently: Apply 1 drop to eye 3 (three) times daily as needed (dry eyes). ) 30 mL 2  . clopidogrel (PLAVIX) 75 MG tablet TAKE 1 TABLET (75 MG TOTAL) BY MOUTH DAILY. 90 tablet 1  . cycloSPORINE (RESTASIS) 0.05 % ophthalmic emulsion Place 1 drop into both eyes 2 (two) times daily as needed (dryness).     . diphenhydrAMINE (BENADRYL) 25 mg capsule Take 25 mg by mouth at bedtime as needed.    . Docosanol (ABREVA) 10 % CREA Apply 1 application topically 5 (five) times daily. Apply to affected area on lip  5 x day until healed 1 Tube 2  . feeding supplement (BOOST HIGH  PROTEIN) LIQD Take 1 Container by mouth See admin instructions. Drink 1 everyday and another as needed for nutrition supplement.    . furosemide (LASIX) 80 MG tablet Take 1.5 tablets (120 mg total) by mouth daily. 135 tablet 1  . isosorbide mononitrate (IMDUR) 30 MG 24 hr tablet Take 1 tablet (30 mg total) by mouth daily. 90 tablet 3  . KLOR-CON M10 10 MEQ tablet Take 1 tablet (10 mEq total) by mouth 2 (two) times daily. 60 tablet 1  . levothyroxine (SYNTHROID, LEVOTHROID) 112 MCG tablet Take 1 tablet (112 mcg total) by mouth daily before breakfast. 90 tablet 0  . metolazone (ZAROXOLYN) 2.5 MG tablet Take 1 tablet (2.5 mg total) by mouth 3 (three) times a week. 40 tablet 3  . nitroGLYCERIN (NITROSTAT) 0.4 MG SL tablet Place 1 tablet (0.4 mg total) under the tongue every 5 (five) minutes as needed for chest pain (For a total of 3 tablets, if chest pain persist to call 911). 50 tablet 3  . ranitidine (ZANTAC) 300 MG capsule Take 1 capsule (300 mg total) by mouth every evening. 90 capsule 1  . rOPINIRole  (REQUIP) 0.25 MG tablet Take 2 tablets (0.5 mg total) by mouth at bedtime. 180 tablet 0  . amiodarone (PACERONE) 200 MG tablet Take 1 tablet (200 mg total) by mouth daily. 90 tablet 1  . Vitamin D, Ergocalciferol, (DRISDOL) 50000 units CAPS capsule Take 1 capsule (50,000 Units total) by mouth every 7 (seven) days. (Patient not taking: Reported on 08/18/2016) 12 capsule 0   No facility-administered medications prior to visit.      Allergies:   Nubain [nalbuphine hcl]; Pineapple; Iodine; Mirtazapine; and Statins   Social History   Social History  . Marital status: Widowed    Spouse name: N/A  . Number of children: 4  . Years of education: N/A   Occupational History  . Retired    Social History Main Topics  . Smoking status: Former Smoker    Types: Cigarettes  . Smokeless tobacco: Never Used     Comment: quit 40 yrs ago- was smoking 2 packs a day at the most  . Alcohol use No  . Drug use: No  . Sexual activity: Yes    Birth control/ protection: Post-menopausal   Other Topics Concern  . Not on file   Social History Narrative   Diet:      Do you drink/ eat things with caffeine? yes      Marital status:    widow                           What year were you married ?  1940      Do you live in a house, apartment,assistred living, condo, trailer, etc.)?  Mobile home      Is it one or more stories? 1      How many persons live in your home ?  1      Do you have any pets in your home ?(please list)  1 cat      Current or past profession: factory worker      Do you exercise?  yes                           Type & how often: small amount      Do you have a living will?  no  Do you have a DNR form?     no                  If not, do you want to discuss one?       Do you have signed POA?HPOA forms?  no               If so, please bring to your        appointment           Family History:  The patient's family history includes Atrial fibrillation in her daughter and  daughter; Cancer in her brother, brother, sister, and sister; Diabetes in her daughter; Emphysema in her father; Heart Problems in her son; Heart disease in her mother; Stroke in her daughter.   ROS:   Please see the history of present illness.    ROS All other systems reviewed and are negative.   PHYSICAL EXAM:   VS:  BP 105/60   Pulse 91   Ht 5\' 2"  (1.575 m)   Wt 99 lb 3.2 oz (45 kg)   BMI 18.14 kg/m     General: Alert, oriented x3, no distress. Cachectic Head: no evidence of trauma, PERRL, EOMI, no exophtalmos or lid lag, no myxedema, no xanthelasma; normal ears, nose and oropharynx Neck: normal jugular venous pulsations and no hepatojugular reflux; brisk carotid pulses without delay and no carotid bruits Chest: clear to auscultation, no signs of consolidation by percussion or palpation, normal fremitus, symmetrical and full respiratory excursions Cardiovascular: normal position and quality of the apical impulse, irregular rhythm, normal first and second heart sounds, no murmurs, rubs or gallops. There is soft pitting edema of the feet, ankles and lower half of the pretibial area in a symmetrical fashion Abdomen: no tenderness or distention, no masses by palpation, no abnormal pulsatility or arterial bruits, normal bowel sounds, no hepatosplenomegaly Extremities: no clubbing, cyanosis; 2+ radial, ulnar and brachial pulses bilaterally; 2+ right femoral, posterior tibial and dorsalis pedis pulses; 2+ left femoral, posterior tibial and dorsalis pedis pulses; no subclavian or femoral bruits Neurological: grossly nonfocal She is extremely hard of hearing Psych: euthymic mood, full affect  Wt Readings from Last 3 Encounters:  08/18/16 99 lb 3.2 oz (45 kg)  08/05/16 105 lb (47.6 kg)  08/03/16 108 lb 6.4 oz (49.2 kg)      Studies/Labs Reviewed:   EKG:  EKG is not ordered today.    Recent Labs: 06/13/2016: B Natriuretic Peptide 903.0 06/14/2016: ALT 25; Magnesium 1.7; Platelets  129 06/16/2016: BUN 23; Creatinine, Ser 1.30; Potassium 4.3; Sodium 140 06/23/2016: Hemoglobin 13.3; TSH 0.260   Lipid Panel    Component Value Date/Time   CHOL 157 04/26/2016 1001   TRIG 109 04/26/2016 1001   HDL 48 04/26/2016 1001   CHOLHDL 3.3 04/26/2016 1001   CHOLHDL 3.3 12/01/2012 1035   VLDL 24 12/01/2012 1035   LDLCALC 87 04/26/2016 1001    Additional studies/ records that were reviewed today include:  Records from recent hospitalization  ASSESSMENT:    1. Pacemaker battery depletion   2. Chronic diastolic congestive heart failure ([place])   3. Coronary artery disease involving native coronary artery of native heart with other form of angina pectoris ([place])   4. Persistent atrial fibrillation ([place])   5. Abdominal aortic aneurysm (AAA) without rupture ([place])   6. Palliative care encounter      PLAN:  In order of problems listed above:  1. PPM: Her pacemaker has reached ERI. Paradoxically,  since she is now in what appears to be permanent atrial fibrillation, she no longer requires a pacemaker. I have advised against generator change out. We will meet again in about 2 months. Her device will still offer backup ventricular pacing until that time. We should be able to re-interrogate the device to make sure she does not require frequent ventricular pacing. 2. CHF: She still has some hypervolemia. The goal is to prevent shortness of breath and to alleviate uncomfortable swelling of her legs or problems with skin integrity that could put her at risk for infection. She has a relatively low blood pressure and we need to be careful to avoid hypovolemia. 3. CAD: Previous PCI in all 3 major coronary artery territories. Currently angina free on nitrates. Intolerant to numerous statins 4. AFib: It appears that she has settled in a pattern of permanent atrial fibrillation which actually makes rhythm management easier, without her pacemaker. Rate control is fair. We'll decrease amiodarone 200 mg  daily. Need to continue to use the amiodarone for rate control, since her blood pressure will not allow conventional AV node blocking agents. Not on anticoagulation due to perceived high risk of bleeding, age and frailty, simultaneous treatment with antiplatelet agents. 5. AAA: Stable in size for years, last measured relatively small at 3.8 cm. She will never be a candidate for aneurysm repair. Routine monitoring does not appear useful. 6. She is now receiving support from palliative care nurses at home. Her daughter asked about life expectancy. I told her this is uncertain. She is not pacemaker dependent. Not changing out her pacemaker generator should not have any impact on survival duration.     Medication Adjustments/Labs and Tests Ordered: Current medicines are reviewed at length with the patient today.  Concerns regarding medicines are outlined above.  Medication changes, Labs and Tests ordered today are listed in the Patient Instructions below. Patient Instructions  Dr Sallyanne Kuster has recommended making the following medication changes: 1. DECREASE Amiodarone to 100 mg daily  Your physician recommends that you schedule a follow-up appointment in 2 months - late AUGUST 2018.  If you need a refill on your cardiac medications before your next appointment, please call your pharmacy.    Signed, Brandy Klein, MD  08/18/2016 1:17 PM    [facility] [address], [place], [place]  [address] Phone: [phone]; Fax: [phone]

## 2016-08-18 NOTE — Patient Instructions (Signed)
Dr Sallyanne Kuster has recommended making the following medication changes: 1. DECREASE Amiodarone to 100 mg daily  Your physician recommends that you schedule a follow-up appointment in 2 months - late AUGUST 2018.  If you need a refill on your cardiac medications before your next appointment, please call your pharmacy.

## 2016-08-19 ENCOUNTER — Telehealth: Payer: Self-pay | Admitting: Cardiovascular Disease

## 2016-08-19 DIAGNOSIS — I25119 Atherosclerotic heart disease of native coronary artery with unspecified angina pectoris: Secondary | ICD-10-CM | POA: Diagnosis not present

## 2016-08-19 DIAGNOSIS — R2681 Unsteadiness on feet: Secondary | ICD-10-CM | POA: Diagnosis not present

## 2016-08-19 DIAGNOSIS — I4891 Unspecified atrial fibrillation: Secondary | ICD-10-CM | POA: Diagnosis not present

## 2016-08-19 DIAGNOSIS — M6281 Muscle weakness (generalized): Secondary | ICD-10-CM | POA: Diagnosis not present

## 2016-08-19 DIAGNOSIS — Z950 Presence of cardiac pacemaker: Secondary | ICD-10-CM | POA: Diagnosis not present

## 2016-08-19 DIAGNOSIS — I110 Hypertensive heart disease with heart failure: Secondary | ICD-10-CM | POA: Diagnosis not present

## 2016-08-19 DIAGNOSIS — J449 Chronic obstructive pulmonary disease, unspecified: Secondary | ICD-10-CM | POA: Diagnosis not present

## 2016-08-19 DIAGNOSIS — I5032 Chronic diastolic (congestive) heart failure: Secondary | ICD-10-CM | POA: Diagnosis not present

## 2016-08-19 NOTE — Telephone Encounter (Signed)
New message   Robert Bellow calling for a verbal order PT  2x week for 6 weeks

## 2016-08-19 NOTE — Telephone Encounter (Signed)
Left message to call back  

## 2016-08-20 DIAGNOSIS — M79605 Pain in left leg: Secondary | ICD-10-CM | POA: Diagnosis not present

## 2016-08-20 DIAGNOSIS — I5032 Chronic diastolic (congestive) heart failure: Secondary | ICD-10-CM | POA: Diagnosis not present

## 2016-08-20 NOTE — Telephone Encounter (Signed)
Spoke with Filbert Berthold, physical therapist, she would like to know if it is okay to do maintenance physical therapy 2 times a week for 6 weeks with the patient. Per Dr. Sallyanne Kuster, verbally, this will be fine. She has been made aware and verbalized her understanding.

## 2016-08-24 DIAGNOSIS — R2681 Unsteadiness on feet: Secondary | ICD-10-CM | POA: Diagnosis not present

## 2016-08-24 DIAGNOSIS — I5032 Chronic diastolic (congestive) heart failure: Secondary | ICD-10-CM | POA: Diagnosis not present

## 2016-08-24 DIAGNOSIS — M6281 Muscle weakness (generalized): Secondary | ICD-10-CM | POA: Diagnosis not present

## 2016-08-24 DIAGNOSIS — I110 Hypertensive heart disease with heart failure: Secondary | ICD-10-CM | POA: Diagnosis not present

## 2016-08-24 DIAGNOSIS — I4891 Unspecified atrial fibrillation: Secondary | ICD-10-CM | POA: Diagnosis not present

## 2016-08-24 DIAGNOSIS — I25119 Atherosclerotic heart disease of native coronary artery with unspecified angina pectoris: Secondary | ICD-10-CM | POA: Diagnosis not present

## 2016-08-24 DIAGNOSIS — Z950 Presence of cardiac pacemaker: Secondary | ICD-10-CM | POA: Diagnosis not present

## 2016-08-24 DIAGNOSIS — J449 Chronic obstructive pulmonary disease, unspecified: Secondary | ICD-10-CM | POA: Diagnosis not present

## 2016-08-26 DIAGNOSIS — I25119 Atherosclerotic heart disease of native coronary artery with unspecified angina pectoris: Secondary | ICD-10-CM | POA: Diagnosis not present

## 2016-08-26 DIAGNOSIS — I4891 Unspecified atrial fibrillation: Secondary | ICD-10-CM | POA: Diagnosis not present

## 2016-08-26 DIAGNOSIS — M6281 Muscle weakness (generalized): Secondary | ICD-10-CM | POA: Diagnosis not present

## 2016-08-26 DIAGNOSIS — R2681 Unsteadiness on feet: Secondary | ICD-10-CM | POA: Diagnosis not present

## 2016-08-26 DIAGNOSIS — Z950 Presence of cardiac pacemaker: Secondary | ICD-10-CM | POA: Diagnosis not present

## 2016-08-26 DIAGNOSIS — I110 Hypertensive heart disease with heart failure: Secondary | ICD-10-CM | POA: Diagnosis not present

## 2016-08-26 DIAGNOSIS — J449 Chronic obstructive pulmonary disease, unspecified: Secondary | ICD-10-CM | POA: Diagnosis not present

## 2016-08-26 DIAGNOSIS — I5032 Chronic diastolic (congestive) heart failure: Secondary | ICD-10-CM | POA: Diagnosis not present

## 2016-08-27 ENCOUNTER — Telehealth: Payer: Self-pay | Admitting: Adult Health

## 2016-08-27 ENCOUNTER — Telehealth: Payer: Self-pay | Admitting: Cardiovascular Disease

## 2016-08-27 NOTE — Telephone Encounter (Signed)
lmtcb keesha [facility]-what was she PT, OT or nurse??

## 2016-08-27 NOTE — Telephone Encounter (Signed)
Rcvd call frm [name] an [name] provider stating patient cancelled her appointment today with them- Please advise doctor of (no home visit today). --if any question call [phone]. --glh

## 2016-08-27 NOTE — Telephone Encounter (Signed)
New message    Pt missed her visit with them and they are calling to let us know.

## 2016-08-31 DIAGNOSIS — M6281 Muscle weakness (generalized): Secondary | ICD-10-CM | POA: Diagnosis not present

## 2016-08-31 DIAGNOSIS — I5032 Chronic diastolic (congestive) heart failure: Secondary | ICD-10-CM | POA: Diagnosis not present

## 2016-08-31 DIAGNOSIS — R2681 Unsteadiness on feet: Secondary | ICD-10-CM | POA: Diagnosis not present

## 2016-08-31 DIAGNOSIS — Z950 Presence of cardiac pacemaker: Secondary | ICD-10-CM | POA: Diagnosis not present

## 2016-08-31 DIAGNOSIS — J449 Chronic obstructive pulmonary disease, unspecified: Secondary | ICD-10-CM | POA: Diagnosis not present

## 2016-08-31 DIAGNOSIS — I25119 Atherosclerotic heart disease of native coronary artery with unspecified angina pectoris: Secondary | ICD-10-CM | POA: Diagnosis not present

## 2016-08-31 DIAGNOSIS — I4891 Unspecified atrial fibrillation: Secondary | ICD-10-CM | POA: Diagnosis not present

## 2016-08-31 DIAGNOSIS — I110 Hypertensive heart disease with heart failure: Secondary | ICD-10-CM | POA: Diagnosis not present

## 2016-09-01 ENCOUNTER — Other Ambulatory Visit: Payer: Self-pay | Admitting: *Deleted

## 2016-09-01 NOTE — Patient Outreach (Addendum)
[facility]) Care Management  09/01/2016  [name] 1917/07/23 [id number]  Follow up telephone call   Spoke with [name] daughter of patient, reports she is doing some better, fluid in legs improving .  Patient without increased shortness of breath,except after activity then improves after resting. Patient continues to weigh daily, without recent sudden increases in weight.  Patient is still followed by home health RN once weekly and PT twice weekly. Daughter discussed patient took a walk on her own yesterday using her walker. Denies patient having recurrent fall.   Daughter discussed plans for patient care while she is out of town in July, family has been arranged to provide care for patient. Daughter has discussed available resources with [facility]  palliative care, no benefits available unless under Hospice care and they cannot afford private pay. Daughter discussed they will continue with palliative services at this time, and not  move toward Hospice care yet.  Daughter discussed she is not sure about what will happen when pacemaker battery is no longer active and no plans to replace they may consider hospice at that time .   Daughter agreeable to home visit  to review education related to heart failure/heart function.   Plan Scheduled home visit in the next week.    [name], RN, [facility] [phone]- Mobile [phone]- Toll Free Main Office

## 2016-09-02 DIAGNOSIS — I5032 Chronic diastolic (congestive) heart failure: Secondary | ICD-10-CM | POA: Diagnosis not present

## 2016-09-02 DIAGNOSIS — R2681 Unsteadiness on feet: Secondary | ICD-10-CM | POA: Diagnosis not present

## 2016-09-02 DIAGNOSIS — I25119 Atherosclerotic heart disease of native coronary artery with unspecified angina pectoris: Secondary | ICD-10-CM | POA: Diagnosis not present

## 2016-09-02 DIAGNOSIS — M6281 Muscle weakness (generalized): Secondary | ICD-10-CM | POA: Diagnosis not present

## 2016-09-02 DIAGNOSIS — I110 Hypertensive heart disease with heart failure: Secondary | ICD-10-CM | POA: Diagnosis not present

## 2016-09-02 DIAGNOSIS — Z950 Presence of cardiac pacemaker: Secondary | ICD-10-CM | POA: Diagnosis not present

## 2016-09-02 DIAGNOSIS — J449 Chronic obstructive pulmonary disease, unspecified: Secondary | ICD-10-CM | POA: Diagnosis not present

## 2016-09-02 DIAGNOSIS — I4891 Unspecified atrial fibrillation: Secondary | ICD-10-CM | POA: Diagnosis not present

## 2016-09-08 ENCOUNTER — Other Ambulatory Visit: Payer: Self-pay

## 2016-09-08 ENCOUNTER — Other Ambulatory Visit: Payer: Self-pay | Admitting: *Deleted

## 2016-09-08 DIAGNOSIS — I110 Hypertensive heart disease with heart failure: Secondary | ICD-10-CM | POA: Diagnosis not present

## 2016-09-08 DIAGNOSIS — J449 Chronic obstructive pulmonary disease, unspecified: Secondary | ICD-10-CM | POA: Diagnosis not present

## 2016-09-08 DIAGNOSIS — E038 Other specified hypothyroidism: Secondary | ICD-10-CM

## 2016-09-08 DIAGNOSIS — I4891 Unspecified atrial fibrillation: Secondary | ICD-10-CM | POA: Diagnosis not present

## 2016-09-08 DIAGNOSIS — M6281 Muscle weakness (generalized): Secondary | ICD-10-CM | POA: Diagnosis not present

## 2016-09-08 DIAGNOSIS — I5032 Chronic diastolic (congestive) heart failure: Secondary | ICD-10-CM | POA: Diagnosis not present

## 2016-09-08 DIAGNOSIS — Z950 Presence of cardiac pacemaker: Secondary | ICD-10-CM | POA: Diagnosis not present

## 2016-09-08 DIAGNOSIS — R2681 Unsteadiness on feet: Secondary | ICD-10-CM | POA: Diagnosis not present

## 2016-09-08 DIAGNOSIS — I25119 Atherosclerotic heart disease of native coronary artery with unspecified angina pectoris: Secondary | ICD-10-CM | POA: Diagnosis not present

## 2016-09-08 MED ORDER — LEVOTHYROXINE SODIUM 112 MCG PO TABS: 112.0000 ug | ORAL_TABLET | Freq: Every day | ORAL | 0 refills | Status: AC

## 2016-09-08 MED ORDER — KLOR-CON M10 10 MEQ PO TBCR: 10.0000 meq | EXTENDED_RELEASE_TABLET | Freq: Two times a day (BID) | ORAL | 0 refills | Status: DC

## 2016-09-08 NOTE — Patient Outreach (Signed)
[facility] [facility]) Care Management   09/08/2016  [name] 1917/12/01 [id number]  [name] is an 83 y.o. female  Subjective:  Patient discussed feeling much better. Denies shortness of breath reports breathing is comfortable. Discussed when she stands initially her left leg is jerky, has been this way since last hospitalization with pneumonia, reports after standing a few seconds she is able to walk on .   Patient discussed her appetite is getting better, I am getting hungry.   Objective:  BP 102/70 (BP Location: Left Arm, Patient Position: Sitting, Cuff Size: Normal)   Pulse 87   Resp 18   Wt 96 lb 6.4 oz (43.7 kg)   SpO2 96%   BMI 17.63 kg/m   Patient resting in recliner chair in her room.  Review of Systems  Constitutional: Negative.   HENT: Negative.        Wears hearing aide  Eyes: Negative.   Respiratory: Negative.  Negative for shortness of breath and wheezing.   Cardiovascular: Positive for leg swelling.  Gastrointestinal: Negative.   Genitourinary: Negative.   Musculoskeletal: Negative.   Skin: Negative.   Neurological:       Complaint of weakness in legs, jerkiness of especially left leg when she stands initially  Endo/Heme/Allergies: Negative.   Psychiatric/Behavioral: Negative.     Physical Exam  Constitutional: She is oriented to person, place, and time. She appears well-developed and well-nourished.  Cardiovascular: Normal rate and normal heart sounds.   Respiratory: Effort normal and breath sounds normal.  GI: Soft.  Neurological: She is alert and oriented to person, place, and time.  Skin: Skin is warm and dry.  Bilateral lower legs sl redden with swelling, scab areas healing   Psychiatric: She has a normal mood and affect. Her behavior is normal. Judgment and thought content normal.    Encounter Medications:   Outpatient Encounter Prescriptions as of 09/08/2016  Medication Sig  . acetaminophen (TYLENOL) 500 MG tablet Take 1  tablet (500 mg total) by mouth at bedtime.  [name] amiodarone (PACERONE) 100 MG tablet Take 1 tablet (100 mg total) by mouth daily.  . Biotin 1000 MCG tablet Take 1 tablet (1 mg total) by mouth daily. (Patient taking differently: Take 5,000 mcg by mouth daily. )  . Calcium Carbonate-Vitamin D (CALCIUM 600+D) 600-400 MG-UNIT tablet Take 1 tablet by mouth daily.  . carboxymethylcellulose 1 % ophthalmic solution Apply 1 drop to eye 3 (three) times daily. (Patient taking differently: Apply 1 drop to eye 3 (three) times daily as needed (dry eyes). )  . clopidogrel (PLAVIX) 75 MG tablet TAKE 1 TABLET (75 MG TOTAL) BY MOUTH DAILY.  . cycloSPORINE (RESTASIS) 0.05 % ophthalmic emulsion Place 1 drop into both eyes 2 (two) times daily as needed (dryness).   . diphenhydrAMINE (BENADRYL) 25 mg capsule Take 25 mg by mouth at bedtime as needed.  . Docosanol (ABREVA) 10 % CREA Apply 1 application topically 5 (five) times daily. Apply to affected area on lip  5 x day until healed  . feeding supplement (BOOST HIGH PROTEIN) LIQD Take 1 Container by mouth See admin instructions. Drink 1 everyday and another as needed for nutrition supplement.  . furosemide (LASIX) 80 MG tablet Take 1.5 tablets (120 mg total) by mouth daily.  . isosorbide mononitrate (IMDUR) 30 MG 24 hr tablet Take 1 tablet (30 mg total) by mouth daily.  [name] KLOR-CON M10 10 MEQ tablet Take 1 tablet (10 mEq total) by mouth 2 (two) times daily.  [name]  levothyroxine (SYNTHROID, LEVOTHROID) 112 MCG tablet Take 1 tablet (112 mcg total) by mouth daily before breakfast.  . metolazone (ZAROXOLYN) 2.5 MG tablet Take 1 tablet (2.5 mg total) by mouth 3 (three) times a week.  . nitroGLYCERIN (NITROSTAT) 0.4 MG SL tablet Place 1 tablet (0.4 mg total) under the tongue every 5 (five) minutes as needed for chest pain (For a total of 3 tablets, if chest pain persist to call 911).  . ranitidine (ZANTAC) 300 MG capsule Take 1 capsule (300 mg total) by mouth every evening.  [name]  rOPINIRole (REQUIP) 0.25 MG tablet Take 2 tablets (0.5 mg total) by mouth at bedtime.   No facility-administered encounter medications on file as of 09/08/2016.     Functional Status:   In your present state of health, do you have any difficulty performing the following activities: 07/02/2016 06/14/2016  Hearing? Tempie Donning  Vision? N N  Difficulty concentrating or making decisions? N N  Walking or climbing stairs? Y N  Dressing or bathing? Y Y  Doing errands, shopping? Y N  Preparing Food and eating ? Y -  Using the Toilet? N -  In the past six months, have you accidently leaked urine? Y -  Do you have problems with loss of bowel control? N -  Managing your Medications? Y -  Managing your Finances? Y -  Housekeeping or managing your Housekeeping? Y -  Some recent data might be hidden    Fall/Depression Screening:    Fall Risk  07/05/2016 04/26/2016 12/09/2014  Falls in the past year? No No No  Number falls in past yr: - - -  Injury with Fall? - - -  Risk for fall due to : Impaired balance/gait;Impaired mobility - -  Risk for fall due to (comments): high fall risk - -   PHQ 2/9 Scores 07/05/2016 04/26/2016 12/09/2014 09/12/2014 08/16/2014 08/01/2014  PHQ - 2 Score 1 0 0 2 1 1   PHQ- 9 Score - - - 4 - -    Assessment:   Patient remains active with [facility] home health RN and physical therapy   Heart Failure - weight is stable, decreased swelling in legs, continues with lasix 120 mg daily. Tolerating walking activity in home .   Fall Risk Continues to use rolling walker active with home health physical therapy.  Patient daughter has arranged for family to provide care when she is out of town. Daughter did place call to center to inquire regarding benefit of respite care.  Patient is active with [facility] Palliative care, has had one initial visit, she is unsure when the next follow up plan is.  Daughter discussing pacemaker and plan not to change generator out, she understands from MD  conversation at office visit that patients' rhythm is atrial fib and she is not pacemaker dependant. Asking about information on symptoms that may occur is pacemaker is no longer working. Discussed with her to notify MD of patient new symptoms or concerns related to increased fatigue, shortness of breath, dizziness.   Plan:  Will follow up with patient in the next month by telephone  Will follow up with palliative care regarding their next plan  Provide emmi education handout on diastolic heart failure    [name], RN, [place] Management [phone]- Mobile [phone]- [place]

## 2016-09-08 NOTE — Telephone Encounter (Signed)
[facility].  Levothyroxine and Klorcon refilled.  Patient needs appointment for further refills.

## 2016-09-09 DIAGNOSIS — M6281 Muscle weakness (generalized): Secondary | ICD-10-CM | POA: Diagnosis not present

## 2016-09-09 DIAGNOSIS — I25119 Atherosclerotic heart disease of native coronary artery with unspecified angina pectoris: Secondary | ICD-10-CM | POA: Diagnosis not present

## 2016-09-09 DIAGNOSIS — I110 Hypertensive heart disease with heart failure: Secondary | ICD-10-CM | POA: Diagnosis not present

## 2016-09-09 DIAGNOSIS — I5032 Chronic diastolic (congestive) heart failure: Secondary | ICD-10-CM | POA: Diagnosis not present

## 2016-09-09 DIAGNOSIS — Z950 Presence of cardiac pacemaker: Secondary | ICD-10-CM | POA: Diagnosis not present

## 2016-09-09 DIAGNOSIS — J449 Chronic obstructive pulmonary disease, unspecified: Secondary | ICD-10-CM | POA: Diagnosis not present

## 2016-09-09 DIAGNOSIS — R2681 Unsteadiness on feet: Secondary | ICD-10-CM | POA: Diagnosis not present

## 2016-09-09 DIAGNOSIS — I4891 Unspecified atrial fibrillation: Secondary | ICD-10-CM | POA: Diagnosis not present

## 2016-09-13 DIAGNOSIS — I110 Hypertensive heart disease with heart failure: Secondary | ICD-10-CM | POA: Diagnosis not present

## 2016-09-13 DIAGNOSIS — M6281 Muscle weakness (generalized): Secondary | ICD-10-CM | POA: Diagnosis not present

## 2016-09-13 DIAGNOSIS — J449 Chronic obstructive pulmonary disease, unspecified: Secondary | ICD-10-CM | POA: Diagnosis not present

## 2016-09-13 DIAGNOSIS — I25119 Atherosclerotic heart disease of native coronary artery with unspecified angina pectoris: Secondary | ICD-10-CM | POA: Diagnosis not present

## 2016-09-13 DIAGNOSIS — I4891 Unspecified atrial fibrillation: Secondary | ICD-10-CM | POA: Diagnosis not present

## 2016-09-13 DIAGNOSIS — R2681 Unsteadiness on feet: Secondary | ICD-10-CM | POA: Diagnosis not present

## 2016-09-13 DIAGNOSIS — Z950 Presence of cardiac pacemaker: Secondary | ICD-10-CM | POA: Diagnosis not present

## 2016-09-13 DIAGNOSIS — I5032 Chronic diastolic (congestive) heart failure: Secondary | ICD-10-CM | POA: Diagnosis not present

## 2016-09-14 ENCOUNTER — Telehealth: Payer: Self-pay | Admitting: Cardiovascular Disease

## 2016-09-14 DIAGNOSIS — I25119 Atherosclerotic heart disease of native coronary artery with unspecified angina pectoris: Secondary | ICD-10-CM | POA: Diagnosis not present

## 2016-09-14 DIAGNOSIS — M6281 Muscle weakness (generalized): Secondary | ICD-10-CM | POA: Diagnosis not present

## 2016-09-14 DIAGNOSIS — I4891 Unspecified atrial fibrillation: Secondary | ICD-10-CM | POA: Diagnosis not present

## 2016-09-14 DIAGNOSIS — I110 Hypertensive heart disease with heart failure: Secondary | ICD-10-CM | POA: Diagnosis not present

## 2016-09-14 DIAGNOSIS — Z950 Presence of cardiac pacemaker: Secondary | ICD-10-CM | POA: Diagnosis not present

## 2016-09-14 DIAGNOSIS — I5032 Chronic diastolic (congestive) heart failure: Secondary | ICD-10-CM | POA: Diagnosis not present

## 2016-09-14 DIAGNOSIS — R2681 Unsteadiness on feet: Secondary | ICD-10-CM | POA: Diagnosis not present

## 2016-09-14 DIAGNOSIS — J449 Chronic obstructive pulmonary disease, unspecified: Secondary | ICD-10-CM | POA: Diagnosis not present

## 2016-09-14 NOTE — Telephone Encounter (Signed)
New message     FYI Nurse is calling to report pt's heart rate today was very irregular.  It would go from 68 to 90 bpm within 1 minute.  Pt has a history of afib.  Pt is also c/o fatigue and weakness.

## 2016-09-14 NOTE — Telephone Encounter (Signed)
Spoke with angel, aware patient is in permanent atrial fib. She is still taking the amiodarone. Nurse was calling to report.

## 2016-09-16 DIAGNOSIS — Z950 Presence of cardiac pacemaker: Secondary | ICD-10-CM | POA: Diagnosis not present

## 2016-09-16 DIAGNOSIS — I4891 Unspecified atrial fibrillation: Secondary | ICD-10-CM | POA: Diagnosis not present

## 2016-09-16 DIAGNOSIS — I110 Hypertensive heart disease with heart failure: Secondary | ICD-10-CM | POA: Diagnosis not present

## 2016-09-16 DIAGNOSIS — I25119 Atherosclerotic heart disease of native coronary artery with unspecified angina pectoris: Secondary | ICD-10-CM | POA: Diagnosis not present

## 2016-09-16 DIAGNOSIS — I5032 Chronic diastolic (congestive) heart failure: Secondary | ICD-10-CM | POA: Diagnosis not present

## 2016-09-16 DIAGNOSIS — M6281 Muscle weakness (generalized): Secondary | ICD-10-CM | POA: Diagnosis not present

## 2016-09-16 DIAGNOSIS — J449 Chronic obstructive pulmonary disease, unspecified: Secondary | ICD-10-CM | POA: Diagnosis not present

## 2016-09-16 DIAGNOSIS — R2681 Unsteadiness on feet: Secondary | ICD-10-CM | POA: Diagnosis not present

## 2016-09-19 DIAGNOSIS — M79605 Pain in left leg: Secondary | ICD-10-CM | POA: Diagnosis not present

## 2016-09-19 DIAGNOSIS — I5032 Chronic diastolic (congestive) heart failure: Secondary | ICD-10-CM | POA: Diagnosis not present

## 2016-09-20 DIAGNOSIS — R2681 Unsteadiness on feet: Secondary | ICD-10-CM | POA: Diagnosis not present

## 2016-09-20 DIAGNOSIS — I110 Hypertensive heart disease with heart failure: Secondary | ICD-10-CM | POA: Diagnosis not present

## 2016-09-20 DIAGNOSIS — Z950 Presence of cardiac pacemaker: Secondary | ICD-10-CM | POA: Diagnosis not present

## 2016-09-20 DIAGNOSIS — I25119 Atherosclerotic heart disease of native coronary artery with unspecified angina pectoris: Secondary | ICD-10-CM | POA: Diagnosis not present

## 2016-09-20 DIAGNOSIS — J449 Chronic obstructive pulmonary disease, unspecified: Secondary | ICD-10-CM | POA: Diagnosis not present

## 2016-09-20 DIAGNOSIS — I5032 Chronic diastolic (congestive) heart failure: Secondary | ICD-10-CM | POA: Diagnosis not present

## 2016-09-20 DIAGNOSIS — M6281 Muscle weakness (generalized): Secondary | ICD-10-CM | POA: Diagnosis not present

## 2016-09-20 DIAGNOSIS — I4891 Unspecified atrial fibrillation: Secondary | ICD-10-CM | POA: Diagnosis not present

## 2016-09-22 DIAGNOSIS — M6281 Muscle weakness (generalized): Secondary | ICD-10-CM | POA: Diagnosis not present

## 2016-09-22 DIAGNOSIS — J449 Chronic obstructive pulmonary disease, unspecified: Secondary | ICD-10-CM | POA: Diagnosis not present

## 2016-09-22 DIAGNOSIS — I4891 Unspecified atrial fibrillation: Secondary | ICD-10-CM | POA: Diagnosis not present

## 2016-09-22 DIAGNOSIS — Z950 Presence of cardiac pacemaker: Secondary | ICD-10-CM | POA: Diagnosis not present

## 2016-09-22 DIAGNOSIS — I110 Hypertensive heart disease with heart failure: Secondary | ICD-10-CM | POA: Diagnosis not present

## 2016-09-22 DIAGNOSIS — R2681 Unsteadiness on feet: Secondary | ICD-10-CM | POA: Diagnosis not present

## 2016-09-22 DIAGNOSIS — I25119 Atherosclerotic heart disease of native coronary artery with unspecified angina pectoris: Secondary | ICD-10-CM | POA: Diagnosis not present

## 2016-09-22 DIAGNOSIS — I5032 Chronic diastolic (congestive) heart failure: Secondary | ICD-10-CM | POA: Diagnosis not present

## 2016-09-23 ENCOUNTER — Other Ambulatory Visit: Payer: Self-pay | Admitting: *Deleted

## 2016-09-23 DIAGNOSIS — I25119 Atherosclerotic heart disease of native coronary artery with unspecified angina pectoris: Secondary | ICD-10-CM | POA: Diagnosis not present

## 2016-09-23 DIAGNOSIS — R2681 Unsteadiness on feet: Secondary | ICD-10-CM | POA: Diagnosis not present

## 2016-09-23 DIAGNOSIS — Z950 Presence of cardiac pacemaker: Secondary | ICD-10-CM | POA: Diagnosis not present

## 2016-09-23 DIAGNOSIS — I4891 Unspecified atrial fibrillation: Secondary | ICD-10-CM | POA: Diagnosis not present

## 2016-09-23 DIAGNOSIS — J449 Chronic obstructive pulmonary disease, unspecified: Secondary | ICD-10-CM | POA: Diagnosis not present

## 2016-09-23 DIAGNOSIS — M6281 Muscle weakness (generalized): Secondary | ICD-10-CM | POA: Diagnosis not present

## 2016-09-23 DIAGNOSIS — I110 Hypertensive heart disease with heart failure: Secondary | ICD-10-CM | POA: Diagnosis not present

## 2016-09-23 DIAGNOSIS — I5032 Chronic diastolic (congestive) heart failure: Secondary | ICD-10-CM | POA: Diagnosis not present

## 2016-09-23 IMAGING — CR DG CHEST 1V PORT
1 series · 1 of 1 positions shown · non-contrast
Comparison: Chest radiograph performed 01/26/2015

CLINICAL DATA: Acute onset of shortness of breath. Bibasilar
crackles. Initial encounter.

EXAM:
PORTABLE CHEST 1 VIEW

[AP]
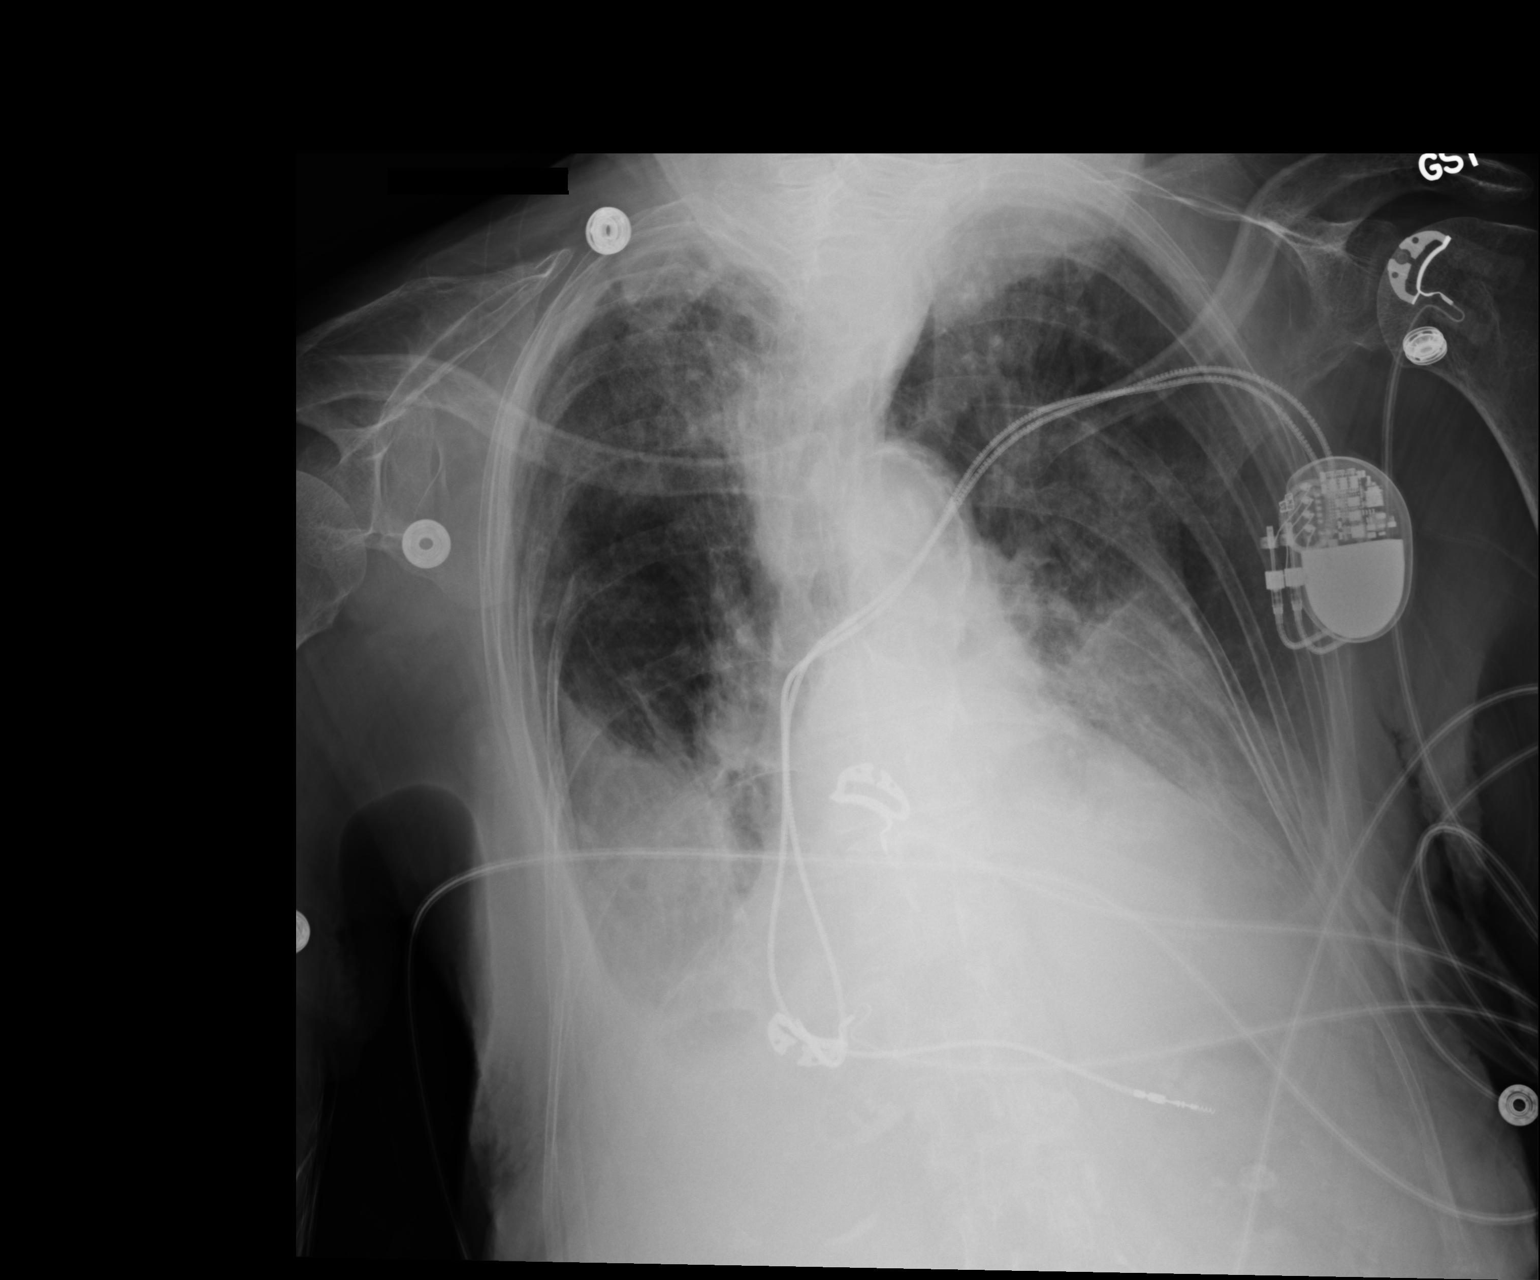

[1 of 1 positions shown; findings below may reference images not displayed]

FINDINGS: Lung expansion is mildly decreased. Small bilateral pleural
effusions are seen. Bibasilar airspace opacities may reflect mild
interstitial edema or possibly pneumonia. No pneumothorax is
identified.

The cardiomediastinal silhouette is mildly enlarged. A pacemaker is
noted overlying the left chest wall, with leads ending overlying the
right atrium and right ventricle. No acute osseous abnormalities are
seen.
IMPRESSION: Lung expansion mildly decreased. Small bilateral pleural effusions
seen. Bibasilar airspace opacities may reflect mild interstitial
edema or possibly pneumonia. Mild cardiomegaly noted.

## 2016-09-23 NOTE — Patient Outreach (Signed)
[facility]) Care Management  09/23/2016  NAO LINZ [birth year] [id number]  Follow up telephone call  Spoke with patients' daughter [name], she discussed that patient has been doing good for the last 2 weeks, her appetite has improved, she is tolerating her walks with physical therapy in the home. She discussed patients breathing is doing well , she just tires easily, denies increased in shortness of breath or swelling, states legs are looking much better.   Discussed with daughter follow up call to Merrily Pew, from [facility]  and plans to visit patient in the next  2 weeks    Daughter requested that if his schedule allows she would like him to visit before she goes out of town on 7/19. Placed return call to [place] able to leave a voice message. Patient will be cared for by her other daughter Torrie Mayers, while [name] is out of town.    Home health RN and PT are still following patient, but daughter states they may end case in the next few weeks.    Plan Will follow up with patient by telephone in the next 3 weeks and plan case closure if no new concerns as Palliative care involved and plan 2nd  follow up visit in the next 2 weeks.   [name], RN, [place] Management [phone]- Mobile [phone]- Toll Free Main Office

## 2016-09-25 ENCOUNTER — Encounter (HOSPITAL_COMMUNITY): Payer: Self-pay | Admitting: Emergency Medicine

## 2016-09-25 ENCOUNTER — Emergency Department (HOSPITAL_COMMUNITY): Payer: Medicare HMO

## 2016-09-25 ENCOUNTER — Inpatient Hospital Stay (HOSPITAL_COMMUNITY)
Admission: EM | Admit: 2016-09-25 | Discharge: 2016-09-27 | DRG: 603 | Disposition: A | Discharge status: Home Health | Payer: Medicare HMO | Attending: Family Medicine | Admitting: Family Medicine

## 2016-09-25 ENCOUNTER — Emergency Department (HOSPITAL_COMMUNITY): Admit: 2016-09-25 | Discharge: 2016-09-25 | Disposition: A | Discharge status: Home / Self Care | Payer: Medicare HMO

## 2016-09-25 DIAGNOSIS — I272 Pulmonary hypertension, unspecified: Secondary | ICD-10-CM | POA: Diagnosis present

## 2016-09-25 DIAGNOSIS — E039 Hypothyroidism, unspecified: Secondary | ICD-10-CM | POA: Diagnosis present

## 2016-09-25 DIAGNOSIS — R404 Transient alteration of awareness: Secondary | ICD-10-CM | POA: Diagnosis not present

## 2016-09-25 DIAGNOSIS — N183 Chronic kidney disease, stage 3 (moderate): Secondary | ICD-10-CM | POA: Diagnosis present

## 2016-09-25 DIAGNOSIS — K219 Gastro-esophageal reflux disease without esophagitis: Secondary | ICD-10-CM | POA: Diagnosis present

## 2016-09-25 DIAGNOSIS — I482 Chronic atrial fibrillation: Secondary | ICD-10-CM | POA: Diagnosis present

## 2016-09-25 DIAGNOSIS — Z955 Presence of coronary angioplasty implant and graft: Secondary | ICD-10-CM

## 2016-09-25 DIAGNOSIS — L0390 Cellulitis, unspecified: Secondary | ICD-10-CM

## 2016-09-25 DIAGNOSIS — L03115 Cellulitis of right lower limb: Secondary | ICD-10-CM

## 2016-09-25 DIAGNOSIS — Z8249 Family history of ischemic heart disease and other diseases of the circulatory system: Secondary | ICD-10-CM

## 2016-09-25 DIAGNOSIS — L03116 Cellulitis of left lower limb: Secondary | ICD-10-CM | POA: Diagnosis not present

## 2016-09-25 DIAGNOSIS — M810 Age-related osteoporosis without current pathological fracture: Secondary | ICD-10-CM | POA: Diagnosis present

## 2016-09-25 DIAGNOSIS — E038 Other specified hypothyroidism: Secondary | ICD-10-CM | POA: Diagnosis not present

## 2016-09-25 DIAGNOSIS — E861 Hypovolemia: Secondary | ICD-10-CM | POA: Diagnosis present

## 2016-09-25 DIAGNOSIS — Z96641 Presence of right artificial hip joint: Secondary | ICD-10-CM | POA: Diagnosis present

## 2016-09-25 DIAGNOSIS — I509 Heart failure, unspecified: Secondary | ICD-10-CM

## 2016-09-25 DIAGNOSIS — E876 Hypokalemia: Secondary | ICD-10-CM

## 2016-09-25 DIAGNOSIS — J441 Chronic obstructive pulmonary disease with (acute) exacerbation: Secondary | ICD-10-CM | POA: Diagnosis present

## 2016-09-25 DIAGNOSIS — I4891 Unspecified atrial fibrillation: Secondary | ICD-10-CM | POA: Diagnosis not present

## 2016-09-25 DIAGNOSIS — T502X5A Adverse effect of carbonic-anhydrase inhibitors, benzothiadiazides and other diuretics, initial encounter: Secondary | ICD-10-CM | POA: Diagnosis present

## 2016-09-25 DIAGNOSIS — Z950 Presence of cardiac pacemaker: Secondary | ICD-10-CM | POA: Diagnosis not present

## 2016-09-25 DIAGNOSIS — E785 Hyperlipidemia, unspecified: Secondary | ICD-10-CM | POA: Diagnosis present

## 2016-09-25 DIAGNOSIS — I252 Old myocardial infarction: Secondary | ICD-10-CM

## 2016-09-25 DIAGNOSIS — I130 Hypertensive heart and chronic kidney disease with heart failure and stage 1 through stage 4 chronic kidney disease, or unspecified chronic kidney disease: Secondary | ICD-10-CM | POA: Diagnosis present

## 2016-09-25 DIAGNOSIS — Z87440 Personal history of urinary (tract) infections: Secondary | ICD-10-CM

## 2016-09-25 DIAGNOSIS — M79604 Pain in right leg: Secondary | ICD-10-CM

## 2016-09-25 DIAGNOSIS — M7989 Other specified soft tissue disorders: Secondary | ICD-10-CM | POA: Diagnosis not present

## 2016-09-25 DIAGNOSIS — Z66 Do not resuscitate: Secondary | ICD-10-CM | POA: Diagnosis not present

## 2016-09-25 DIAGNOSIS — I517 Cardiomegaly: Secondary | ICD-10-CM | POA: Diagnosis not present

## 2016-09-25 DIAGNOSIS — Z888 Allergy status to other drugs, medicaments and biological substances status: Secondary | ICD-10-CM

## 2016-09-25 DIAGNOSIS — Z833 Family history of diabetes mellitus: Secondary | ICD-10-CM

## 2016-09-25 DIAGNOSIS — R531 Weakness: Secondary | ICD-10-CM | POA: Diagnosis present

## 2016-09-25 DIAGNOSIS — I959 Hypotension, unspecified: Secondary | ICD-10-CM | POA: Diagnosis present

## 2016-09-25 DIAGNOSIS — Z87891 Personal history of nicotine dependence: Secondary | ICD-10-CM

## 2016-09-25 DIAGNOSIS — M6281 Muscle weakness (generalized): Secondary | ICD-10-CM | POA: Diagnosis not present

## 2016-09-25 DIAGNOSIS — L299 Pruritus, unspecified: Secondary | ICD-10-CM | POA: Diagnosis present

## 2016-09-25 DIAGNOSIS — J90 Pleural effusion, not elsewhere classified: Secondary | ICD-10-CM | POA: Diagnosis not present

## 2016-09-25 DIAGNOSIS — Z885 Allergy status to narcotic agent status: Secondary | ICD-10-CM

## 2016-09-25 DIAGNOSIS — Z7902 Long term (current) use of antithrombotics/antiplatelets: Secondary | ICD-10-CM

## 2016-09-25 DIAGNOSIS — I2510 Atherosclerotic heart disease of native coronary artery without angina pectoris: Secondary | ICD-10-CM | POA: Diagnosis present

## 2016-09-25 DIAGNOSIS — Z823 Family history of stroke: Secondary | ICD-10-CM

## 2016-09-25 DIAGNOSIS — Z825 Family history of asthma and other chronic lower respiratory diseases: Secondary | ICD-10-CM

## 2016-09-25 DIAGNOSIS — Z79899 Other long term (current) drug therapy: Secondary | ICD-10-CM

## 2016-09-25 DIAGNOSIS — I5032 Chronic diastolic (congestive) heart failure: Secondary | ICD-10-CM | POA: Diagnosis present

## 2016-09-25 DIAGNOSIS — I495 Sick sinus syndrome: Secondary | ICD-10-CM | POA: Diagnosis present

## 2016-09-25 DIAGNOSIS — Z91018 Allergy to other foods: Secondary | ICD-10-CM

## 2016-09-25 DIAGNOSIS — I361 Nonrheumatic tricuspid (valve) insufficiency: Secondary | ICD-10-CM | POA: Diagnosis not present

## 2016-09-25 DIAGNOSIS — M79609 Pain in unspecified limb: Secondary | ICD-10-CM | POA: Diagnosis not present

## 2016-09-25 DIAGNOSIS — Z8673 Personal history of transient ischemic attack (TIA), and cerebral infarction without residual deficits: Secondary | ICD-10-CM

## 2016-09-25 LAB — COMPREHENSIVE METABOLIC PANEL
ALBUMIN: 3 g/dL — AB (ref 3.5–5.0)
ALT: 16 U/L (ref 14–54)
AST: 44 U/L — AB (ref 15–41)
Alkaline Phosphatase: 73 U/L (ref 38–126)
Anion gap: 11 (ref 5–15)
BUN: 55 mg/dL — AB (ref 6–20)
CHLORIDE: 85 mmol/L — AB (ref 101–111)
CO2: 39 mmol/L — ABNORMAL HIGH (ref 22–32)
CREATININE: 1.96 mg/dL — AB (ref 0.44–1.00)
Calcium: 8.8 mg/dL — ABNORMAL LOW (ref 8.9–10.3)
GFR calc Af Amer: 23 mL/min — ABNORMAL LOW (ref >60)
GFR calc non Af Amer: 20 mL/min — ABNORMAL LOW (ref >60)
GLUCOSE: 89 mg/dL (ref 65–99)
POTASSIUM: 2.4 mmol/L — AB (ref 3.5–5.1)
Sodium: 135 mmol/L (ref 135–145)
Total Bilirubin: 1.1 mg/dL (ref 0.3–1.2)
Total Protein: 6.1 g/dL — ABNORMAL LOW (ref 6.5–8.1)

## 2016-09-25 LAB — URINALYSIS, ROUTINE W REFLEX MICROSCOPIC
BILIRUBIN URINE: NEGATIVE
Glucose, UA: NEGATIVE mg/dL
HGB URINE DIPSTICK: NEGATIVE
Ketones, ur: NEGATIVE mg/dL
Leukocytes, UA: NEGATIVE
Nitrite: NEGATIVE
Protein, ur: NEGATIVE mg/dL
SPECIFIC GRAVITY, URINE: 1.008 (ref 1.005–1.030)
pH: 7 (ref 5.0–8.0)

## 2016-09-25 LAB — CBC WITH DIFFERENTIAL/PLATELET
BASOS PCT: 0 %
Basophils Absolute: 0 10*3/uL (ref 0.0–0.1)
EOS ABS: 0 10*3/uL (ref 0.0–0.7)
Eosinophils Relative: 0 %
HEMATOCRIT: 41.6 % (ref 36.0–46.0)
HEMOGLOBIN: 13.1 g/dL (ref 12.0–15.0)
LYMPHS ABS: 1.2 10*3/uL (ref 0.7–4.0)
Lymphocytes Relative: 10 %
MCH: 32.8 pg (ref 26.0–34.0)
MCHC: 31.5 g/dL (ref 30.0–36.0)
MCV: 104.3 fL — ABNORMAL HIGH (ref 78.0–100.0)
MONOS PCT: 7 %
Monocytes Absolute: 0.9 10*3/uL (ref 0.1–1.0)
NEUTROS ABS: 9.7 10*3/uL — AB (ref 1.7–7.7)
NEUTROS PCT: 82 %
Platelets: 99 10*3/uL — ABNORMAL LOW (ref 150–400)
RBC: 3.99 MIL/uL (ref 3.87–5.11)
RDW: 15.1 % (ref 11.5–15.5)
WBC: 11.8 10*3/uL — AB (ref 4.0–10.5)

## 2016-09-25 LAB — I-STAT TROPONIN, ED: TROPONIN I, POC: 0.07 ng/mL (ref 0.00–0.08)

## 2016-09-25 LAB — I-STAT CG4 LACTIC ACID, ED: Lactic Acid, Venous: 1.62 mmol/L (ref 0.5–1.9)

## 2016-09-25 LAB — MAGNESIUM: MAGNESIUM: 1.5 mg/dL — AB (ref 1.7–2.4)

## 2016-09-25 MED ORDER — CLOPIDOGREL BISULFATE 75 MG PO TABS
75.0000 mg | ORAL_TABLET | Freq: Every day | ORAL | Status: DC
  Administered 2016-09-25 – 2016-09-27 (×3): 75 mg via ORAL
  Filled 2016-09-25 (×3): qty 1

## 2016-09-25 MED ORDER — ACETAMINOPHEN 650 MG RE SUPP: 650.0000 mg | Freq: Four times a day (QID) | RECTAL | Status: DC | PRN

## 2016-09-25 MED ORDER — FUROSEMIDE 20 MG PO TABS
80.0000 mg | ORAL_TABLET | Freq: Every day | ORAL | Status: DC
  Administered 2016-09-26 – 2016-09-27 (×2): 80 mg via ORAL
  Filled 2016-09-25 (×2): qty 4

## 2016-09-25 MED ORDER — CLINDAMYCIN PHOSPHATE 600 MG/50ML IV SOLN
600.0000 mg | Freq: Once | INTRAVENOUS | Status: AC
  Administered 2016-09-25: 600 mg via INTRAVENOUS
  Filled 2016-09-25: qty 50

## 2016-09-25 MED ORDER — SODIUM CHLORIDE 0.9% FLUSH
3.0000 mL | Freq: Two times a day (BID) | INTRAVENOUS | Status: DC
  Administered 2016-09-25 – 2016-09-27 (×4): 3 mL via INTRAVENOUS

## 2016-09-25 MED ORDER — SODIUM CHLORIDE 0.9 % IV BOLUS (SEPSIS)
500.0000 mL | Freq: Once | INTRAVENOUS | Status: AC
  Administered 2016-09-25: 500 mL via INTRAVENOUS

## 2016-09-25 MED ORDER — FAMOTIDINE 20 MG PO TABS
10.0000 mg | ORAL_TABLET | Freq: Every day | ORAL | Status: DC
  Administered 2016-09-25 – 2016-09-27 (×3): 10 mg via ORAL
  Filled 2016-09-25 (×3): qty 1

## 2016-09-25 MED ORDER — CEFTRIAXONE SODIUM 1 G IJ SOLR: 1.0000 g | INTRAMUSCULAR | Status: DC

## 2016-09-25 MED ORDER — ISOSORBIDE MONONITRATE ER 30 MG PO TB24
30.0000 mg | ORAL_TABLET | Freq: Every day | ORAL | Status: DC
  Administered 2016-09-25 – 2016-09-27 (×3): 30 mg via ORAL
  Filled 2016-09-25 (×3): qty 1

## 2016-09-25 MED ORDER — MAGNESIUM SULFATE IN D5W 1-5 GM/100ML-% IV SOLN
1.0000 g | Freq: Once | INTRAVENOUS | Status: AC
Start: 2016-09-25 — End: 2016-09-25
  Administered 2016-09-25: 1 g via INTRAVENOUS
  Filled 2016-09-25: qty 100

## 2016-09-25 MED ORDER — POTASSIUM CHLORIDE CRYS ER 20 MEQ PO TBCR
40.0000 meq | EXTENDED_RELEASE_TABLET | Freq: Two times a day (BID) | ORAL | Status: DC
  Administered 2016-09-25 – 2016-09-27 (×4): 40 meq via ORAL
  Filled 2016-09-25 (×4): qty 2

## 2016-09-25 MED ORDER — ROPINIROLE HCL 0.5 MG PO TABS
0.5000 mg | ORAL_TABLET | Freq: Every day | ORAL | Status: DC
  Administered 2016-09-25 – 2016-09-26 (×2): 0.5 mg via ORAL
  Filled 2016-09-25 (×3): qty 1

## 2016-09-25 MED ORDER — POTASSIUM CHLORIDE 10 MEQ/100ML IV SOLN
10.0000 meq | INTRAVENOUS | Status: AC
  Administered 2016-09-25 (×3): 10 meq via INTRAVENOUS
  Filled 2016-09-25 (×3): qty 100

## 2016-09-25 MED ORDER — POLYVINYL ALCOHOL 1.4 % OP SOLN
1.0000 [drp] | Freq: Three times a day (TID) | OPHTHALMIC | Status: DC
  Administered 2016-09-26 – 2016-09-27 (×4): 1 [drp] via OPHTHALMIC
  Filled 2016-09-25: qty 15

## 2016-09-25 MED ORDER — LEVOTHYROXINE SODIUM 112 MCG PO TABS
112.0000 ug | ORAL_TABLET | Freq: Every day | ORAL | Status: DC
  Administered 2016-09-26 – 2016-09-27 (×2): 112 ug via ORAL
  Filled 2016-09-25 (×3): qty 1

## 2016-09-25 MED ORDER — ACETAMINOPHEN 325 MG PO TABS
650.0000 mg | ORAL_TABLET | Freq: Four times a day (QID) | ORAL | Status: DC | PRN
  Administered 2016-09-27: 650 mg via ORAL
  Filled 2016-09-25: qty 2

## 2016-09-25 MED ORDER — POTASSIUM CHLORIDE CRYS ER 20 MEQ PO TBCR
40.0000 meq | EXTENDED_RELEASE_TABLET | Freq: Once | ORAL | Status: AC
  Administered 2016-09-25: 40 meq via ORAL
  Filled 2016-09-25: qty 2

## 2016-09-25 MED ORDER — CYCLOSPORINE 0.05 % OP EMUL: 1.0000 [drp] | Freq: Two times a day (BID) | OPHTHALMIC | Status: DC | PRN

## 2016-09-25 MED ORDER — DOCOSANOL 10 % EX CREA: 1.0000 "application " | TOPICAL_CREAM | Freq: Every day | CUTANEOUS | Status: DC

## 2016-09-25 MED ORDER — BOOST HIGH PROTEIN PO LIQD
1.0000 | ORAL | Status: DC
  Filled 2016-09-25: qty 237

## 2016-09-25 MED ORDER — ENOXAPARIN SODIUM 30 MG/0.3ML ~~LOC~~ SOLN
30.0000 mg | SUBCUTANEOUS | Status: DC
  Administered 2016-09-25 – 2016-09-26 (×2): 30 mg via SUBCUTANEOUS
  Filled 2016-09-25 (×2): qty 0.3

## 2016-09-25 MED ORDER — AMIODARONE HCL 200 MG PO TABS
100.0000 mg | ORAL_TABLET | Freq: Every day | ORAL | Status: DC
  Administered 2016-09-25 – 2016-09-27 (×3): 100 mg via ORAL
  Filled 2016-09-25 (×3): qty 1

## 2016-09-25 MED ORDER — CALCIUM CARBONATE-VITAMIN D 500-200 MG-UNIT PO TABS
1.0000 | ORAL_TABLET | Freq: Every day | ORAL | Status: DC
  Administered 2016-09-25 – 2016-09-27 (×3): 1 via ORAL
  Filled 2016-09-25 (×4): qty 1

## 2016-09-25 MED ORDER — BIOTIN 1000 MCG PO TABS: 1000.0000 ug | ORAL_TABLET | Freq: Every day | ORAL | Status: DC

## 2016-09-25 NOTE — ED Notes (Signed)
Attempted PIV x2. 2nd RN to attempt.

## 2016-09-25 NOTE — ED Notes (Signed)
Patient transported to X-ray 

## 2016-09-25 NOTE — Progress Notes (Signed)
New pt admission from ED. Pt brought to the floor in stable condition. Vitals taken. Initial Assessment done. All immediate pertinent needs to patient addressed. Patient Guide given to patient. Important safety instructions relating to hospitalization reviewed with patient. Patient verbalized understanding. Will continue to monitor pt. 

## 2016-09-25 NOTE — Progress Notes (Signed)
VASCULAR LAB PRELIMINARY  PRELIMINARY  PRELIMINARY  PRELIMINARY  Right lower extremity venous duplex completed.    Preliminary report:  There is no DVT or SVT noted in the right lower extremity.  There is interstitial fluid noted throughout the right calf.  Called results to Dr. Loreen Freud, [facility], RVT 09/25/2016, 3:22 PM

## 2016-09-25 NOTE — ED Triage Notes (Addendum)
Pt BIB EMS from home for generalized weakness and hypotension. Per EMS pt having weakness and mobility issues since this AM; pt denies pain, fever, CP, or SOB. Pt BP 78/50 with EMS, received 500 cc bolus PTA; BP 116/69 on ED assessment. Pt A&Ox4; resp e/u; nad at this time. Family at bedside.   Per family, pt pacemaker battery due to run out sometime next month.

## 2016-09-25 NOTE — ED Notes (Signed)
Critical Lab:  2.4 Potassium MD made aware

## 2016-09-25 NOTE — H&P (Signed)
[facility] History and Physical  [name] ZRA:[id number] DOB: 12/07/1917 DOA: 09/25/2016  Referring physician:  PCP: Esaw Grandchild, NP  Specialists:   Chief Complaint: weakness, LE redness   HPI: [name] is a 83 y.o. female with PMH of HTN, CAD, A fib not on anticogulation, s/p PPM, COPD, hypothyroidism, CHF presented with generalized weakness, fatigue, tiredness. D/w patient and her family at the bedside.  They report that patient had been taking 120 mg lasix daily for several weeks due to leg edema. Patient states that she felt weak and tired and could not ambulate well today. She denies focal neuro weakness or paresthesias, no diplopia, no change in speech, no vertigo, no syncope, no falls. She states that she just felt weak and tired.  She also reports worsening of her bilateral leg erythema, few ulcers not draining. Denies fever, no chills. She had some nausea and vomiting this morning. Denies abdominal pains, no hematemesis, no diarrhea, had normal bowel movement last night.  -LK:[id number] on admission and received iv fluid prior to arrival. Started on iv clynda for cellulitis      Review of Systems: The patient denies anorexia, fever, weight loss,, vision loss, decreased hearing, hoarseness, chest pain, syncope, dyspnea on exertion, peripheral edema, balance deficits, hemoptysis, abdominal pain, melena, hematochezia, severe indigestion/heartburn, hematuria, incontinence, genital sores, muscle weakness, suspicious skin lesions, transient blindness, difficulty walking, depression, unusual weight change, abnormal bleeding, enlarged lymph nodes, angioedema, and breast masses.    Past Medical History:  Diagnosis Date  . Abdominal aortic aneurysm ([place])   . Anemia    hx  . Asthma    hx  . Atrial fibrillation ([place])   . Blood transfusion   . Blood transfusion without reported diagnosis   . CAD (coronary artery disease)   . CHF (congestive heart failure) ([place])   . COPD  (chronic obstructive pulmonary disease) ([place])   . GERD (gastroesophageal reflux disease)   . GI bleed 2007  . H/O hiatal hernia   . Heart attack ([place])   . Heart disease   . History of right hip replacement 2013  . Hyperlipidemia   . Hypertension   . Hypothyroidism   . Myocardial infarction ([place])   . Osteoporosis   . Pacemaker   . Renal failure, chronic   . SSS (sick sinus syndrome) ([place]) 10/05/2007   Medtronic Adapta  . Stroke ([place])   . TIA (transient ischemic attack)   . UTI (lower urinary tract infection)    hx   Past Surgical History:  Procedure Laterality Date  . CARDIAC CATHETERIZATION    . COLONOSCOPY    . CORONARY STENT PLACEMENT     x9  . FRACTURE SURGERY     femur fx, /w ORIF into HIP- 2008  . JOINT REPLACEMENT    . NM MYOCAR PERF WALL MOTION  10/08/2010   mild apical anterior ischemia  . PACEMAKER INSERTION  10/05/2007   Medtronic Adapta  . TOTAL HIP ARTHROPLASTY     planned for 08/27/2011 Left  . TOTAL HIP ARTHROPLASTY  08/27/2011   Procedure: TOTAL HIP ARTHROPLASTY;  Surgeon: Kerin Salen, MD;  Location: [place];  Service: Orthopedics;  Laterality: Right;  . US ECHOCARDIOGRAPHY  08/23/2011   EF 50%,LA mod. dilated,mild to mod. mitral annular ca+,mod. MR,mild to mod TR,mild to mod. PH,trace AI.   Social History:  reports that she has quit smoking. Her smoking use included Cigarettes. She has never used smokeless tobacco. She reports that she does  not drink alcohol or use drugs. Home;  where does patient live--home, ALF, SNF? and with whom if at home? Not;  Can patient participate in ADLs?  Allergies  Allergen Reactions  . Nubain [Nalbuphine Hcl] Anaphylaxis  . Pineapple Other (See Comments)    Listed on [facility] 06/2015  . Iodine Rash  . Mirtazapine Other (See Comments)    "Weakness in legs"  . Statins Other (See Comments)    Leg weakness    Family History  Problem Relation Age of Onset  . Emphysema Father   . Heart disease Mother   . Cancer Brother   .  Cancer Brother   . Cancer Sister   . Cancer Sister   . Diabetes Daughter   . Atrial fibrillation Daughter   . Atrial fibrillation Daughter   . Stroke Daughter   . Heart Problems Son     (be sure to complete)  Prior to Admission medications   Medication Sig Start Date End Date Taking? Authorizing Provider  acetaminophen (TYLENOL) 500 MG tablet Take 1 tablet (500 mg total) by mouth at bedtime. 04/26/16  Yes Danford, Valetta Fuller D, NP  amiodarone (PACERONE) 100 MG tablet Take 1 tablet (100 mg total) by mouth daily. 08/18/16  Yes Croitoru, Mihai, MD  Biotin 1000 MCG tablet Take 1 tablet (1 mg total) by mouth daily. Patient taking differently: Take 5,000 mcg by mouth daily.  04/26/16  Yes Danford, Valetta Fuller D, NP  Calcium Carbonate-Vitamin D (CALCIUM 600+D) 600-400 MG-UNIT tablet Take 1 tablet by mouth daily. 04/26/16  Yes Danford, Katy D, NP  carboxymethylcellulose 1 % ophthalmic solution Apply 1 drop to eye 3 (three) times daily. Patient taking differently: Apply 1 drop to eye 3 (three) times daily as needed (dry eyes).  04/26/16  Yes Danford, Valetta Fuller D, NP  clopidogrel (PLAVIX) 75 MG tablet TAKE 1 TABLET (75 MG TOTAL) BY MOUTH DAILY. 08/06/16  Yes Croitoru, Mihai, MD  cycloSPORINE (RESTASIS) 0.05 % ophthalmic emulsion Place 1 drop into both eyes 2 (two) times daily as needed (dryness).    Yes [provider]  diphenhydrAMINE (BENADRYL) 25 mg capsule Take 25 mg by mouth at bedtime as needed.   Yes [provider]  furosemide (LASIX) 80 MG tablet Take 1.5 tablets (120 mg total) by mouth daily. 07/28/16  Yes Croitoru, Mihai, MD  isosorbide mononitrate (IMDUR) 30 MG 24 hr tablet Take 1 tablet (30 mg total) by mouth daily. 06/24/16 09/25/16 Yes Croitoru, Mihai, MD  KLOR-CON M10 10 MEQ tablet Take 1 tablet (10 mEq total) by mouth 2 (two) times daily. 09/08/16  Yes Danford, Valetta Fuller D, NP  levothyroxine (SYNTHROID, LEVOTHROID) 112 MCG tablet Take 1 tablet (112 mcg total) by mouth daily before breakfast. 09/08/16   Yes Danford, Valetta Fuller D, NP  metolazone (ZAROXOLYN) 2.5 MG tablet Take 1 tablet (2.5 mg total) by mouth 3 (three) times a week. Patient taking differently: Take 2.5 mg by mouth 3 (three) times a week. Mon / Wed / Fri 07/09/16 07/18/17 Yes Croitoru, Dani Gobble, MD  nitroGLYCERIN (NITROSTAT) 0.4 MG SL tablet Place 1 tablet (0.4 mg total) under the tongue every 5 (five) minutes as needed for chest pain (For a total of 3 tablets, if chest pain persist to call 911). 10/15/14  Yes Eubanks, Carlos American, NP  ranitidine (ZANTAC) 300 MG capsule Take 1 capsule (300 mg total) by mouth every evening. 04/26/16  Yes Danford, Valetta Fuller D, NP  rOPINIRole (REQUIP) 0.25 MG tablet Take 2 tablets (0.5 mg total) by mouth at bedtime.  08/05/16  Yes Danford, Valetta Fuller D, NP  Docosanol (ABREVA) 10 % CREA Apply 1 application topically 5 (five) times daily. Apply to affected area on lip  5 x day until healed 04/26/16   Mina Marble D, NP  feeding supplement (BOOST HIGH PROTEIN) LIQD Take 1 Container by mouth See admin instructions. Drink 1 everyday and another as needed for nutrition supplement.    [provider]   Physical Exam: Vitals:   09/25/16 1500 09/25/16 1546  BP: 101/66 111/60  Pulse: (!) 106 90  Resp: 16 (!) 23  Temp:       General:  Alert, oriented   Eyes: eom-I, perrla   ENT: no oral ulcers   Neck: supple, no JVD  Cardiovascular: s1,s2 rrr  Respiratory: CTA BL  Abdomen: soft, nt, nd   Skin: few LE ulcers, LE erythema, ulceration R>L extremity   Musculoskeletal: no leg edema  Psychiatric: no hallucinations   Neurologic: CN 2-12 intact. Motor symmetric, BL 4/5. Sensory is intact.   Labs on Admission:  Basic Metabolic Panel:  Recent Labs Lab 09/25/16 1210  NA 135  K 2.4*  CL 85*  CO2 39*  GLUCOSE 89  BUN 55*  CREATININE 1.96*  CALCIUM 8.8*   Liver Function Tests:  Recent Labs Lab 09/25/16 1210  AST 44*  ALT 16  ALKPHOS 73  BILITOT 1.1  PROT 6.1*  ALBUMIN 3.0*   No results for input(s):  LIPASE, AMYLASE in the last 168 hours. No results for input(s): AMMONIA in the last 168 hours. CBC:  Recent Labs Lab 09/25/16 1210  WBC 11.8*  NEUTROABS 9.7*  HGB 13.1  HCT 41.6  MCV 104.3*  PLT 99*   Cardiac Enzymes: No results for input(s): CKTOTAL, CKMB, CKMBINDEX, TROPONINI in the last 168 hours.  BNP (last 3 results)  Recent Labs  06/13/16 2343  BNP 903.0*    ProBNP (last 3 results) No results for input(s): PROBNP in the last 8760 hours.  CBG: No results for input(s): GLUCAP in the last 168 hours.  Radiological Exams on Admission: Dg Chest 2 View  Result Date: 09/25/2016 CLINICAL DATA:  Weakness today EXAM: CHEST  2 VIEW COMPARISON:  06/13/2016 FINDINGS: The heart is markedly enlarged. Pulmonary vascularity is within normal limits. Left subclavian double lead pacemaker device and leads are stable and intact. Small pleural effusions. No pneumothorax. No evidence of consolidation or lung mass. Thoracic spine is osteopenia. There is kyphosis. IMPRESSION: Cardiomegaly without edema.  Small pleural effusions. Electronically Signed   By: Marybelle Killings M.D.   On: 09/25/2016 13:08   Dg Tibia/fibula Right  Result Date: 09/25/2016 CLINICAL DATA:  Right lower extremity inflammation, swelling, pain EXAM: RIGHT TIBIA AND FIBULA - 2 VIEW COMPARISON:  None. FINDINGS: No acute bony abnormality. Specifically, no fracture, subluxation, or dislocation. Soft tissues are intact. Mild degenerative changes in the right ankle. IMPRESSION: No acute bony abnormality. Electronically Signed   By: Rolm Baptise M.D.   On: 09/25/2016 15:06    EKG: Independently reviewed.   Assessment/Plan Active Problems:   Hypothyroid   SSS (sick sinus syndrome) (H[facility])   Cellulitis of leg, left   COPD exacerbation ([facility])   Weakness generalized   CHF (congestive heart failure) ([facility])   Cellulitis   Hypokalemia  83 y.o. female with PMH of HTN, A fib not on anticogulation, s/p PPM, COPD, hypothyroidism, CHF  presented with generalized weakness, fatigue, tiredness and admitted with cellulitis, hypokalemia, generalized weakness    LE cellulitis, ulcerations. L>R. No obvious  abscess. Started on iv antibiotic treatment, monitor   Generalized weakness. Probable due to over diuresis with lasix, hypokalemia. Neuro exam is non focal. Will replace electrolytes, obtain pt/ot eval. Fall precautions  A fib not on anticoagulation due to fall/bleeding risk, s/p PPM. On amiodarone   . Chronic heart failure with pulmonary HTN. Previously on lasix 20 mg daily then increased to 60 mg, subsequently to 120 mg daily for the last few weeks due to leg edema.  -clinically hypovolemic, hypotension prior to admission, received iv fluids. Check orthostatics. Will hold lasix/metolozone today. Restart in AM with lasix at 80 mg without metolazone. Monitor I/o, urine output, weight   Hypokalemia. Due to diuresis. Replace. Check Mg, f/u labs  Hypothyroidism. Last tsh-0.26 (06/2016). Recheck tsh  None.  if consultant consulted, please document name and whether formally or informally consulted Patient is DNR, confirmed with her family  Code Status: DNR (must indicate code status--if unknown or must be presumed, indicate so) Family Communication: d/w patient, her family (indicate person spoken with, if applicable, with phone number if by telephone) Disposition Plan: pend clinical improvement  (indicate anticipated LOS)  Time spent: >45 minutes   Kinnie Feil [facility] Pager [phone]  If 7PM-7AM, please contact night-coverage [email or web address] Password [facility] 09/25/2016, 4:06 PM

## 2016-09-25 NOTE — ED Provider Notes (Signed)
[facility] DEPT Provider Note   CSN: [id number] Arrival date & time: 09/25/16  1140     History   Chief Complaint Chief Complaint  Patient presents with  . Weakness  . Hypotension    HPI [name] is a 83 y.o. female.  Patient is a 83 year old female with a history of chronic A. fib, hypertension, hyperlipidemia, CHF with pacemaker placement, coronary artery disease and AAA who presents with fatigue. She states that she was feeling pretty good last night and when she woke up this morning she was very weak and hasn't been able to ambulate. She had no appetite this morning. She's had some episode of vomiting this morning. No diarrhea. No urinary symptoms. No fevers. No cough or shortness of breath. No chest pain. No other recent illnesses. Her last echo was done 2 years ago and showed an EF of 55-60%. She was recently admitted for CHF exacerbation in April but it was not done at that point due to her advanced age. She does have a AAA and her last ultrasound was in February of this year which showed 4.5 mm which was stable from the last ultrasound of 4.2 mm.      Past Medical History:  Diagnosis Date  . Abdominal aortic aneurysm ([place])   . Anemia    hx  . Asthma    hx  . Atrial fibrillation ([place])   . Blood transfusion   . Blood transfusion without reported diagnosis   . CAD (coronary artery disease)   . CHF (congestive heart failure) ([place])   . COPD (chronic obstructive pulmonary disease) ([place])   . GERD (gastroesophageal reflux disease)   . GI bleed 2007  . H/O hiatal hernia   . Heart attack ([place])   . Heart disease   . History of right hip replacement 2013  . Hyperlipidemia   . Hypertension   . Hypothyroidism   . Myocardial infarction ([place])   . Osteoporosis   . Pacemaker   . Renal failure, chronic   . SSS (sick sinus syndrome) ([place]) 10/05/2007   Medtronic Adapta  . Stroke ([place])   . TIA (transient ischemic attack)   . UTI (lower urinary tract infection)    hx      Patient Active Problem List   Diagnosis Date Noted  . Itching 08/05/2016  . Abnormal TSH 06/23/2016  . CHF (congestive heart failure) ([place]) 06/14/2016  . COPD (chronic obstructive pulmonary disease) ([place]) 11/29/2015  . Abdominal aortic aneurysm (AAA) without rupture ([place]) 08/21/2015  . DNR (do not resuscitate)   . Weakness generalized   . GERD (gastroesophageal reflux disease) 07/04/2015  . Dysphagia 07/04/2015  . Transaminitis 06/24/2015  . HCAP (healthcare-associated pneumonia) 06/22/2015  . Pneumonia 06/22/2015  . COPD exacerbation ([place]) 06/21/2015  . Cough 06/09/2015  . Fever, unspecified 06/09/2015  . Chronic diastolic congestive heart failure ([place]) 04/24/2015  . Restless legs 04/24/2015  . Pain and swelling of left lower extremity 02/15/2015  . Candidiasis of perineum 02/15/2015  . Hemorrhoids 02/15/2015  . CKD (chronic kidney disease) stage 3, GFR 30-59 ml/min 02/06/2015  . Acute respiratory failure with hypoxia ([place]) 01/29/2015  . Acute pulmonary edema ([place]) 01/29/2015  . SOB (shortness of breath)   . Palliative care encounter   . Sepsis ([place]) 01/26/2015  . Cellulitis of leg, left 01/26/2015  . Protein-calorie malnutrition, severe ([place]) 11/06/2014  . Pressure ulcer 11/06/2014  . Acute diastolic CHF (congestive heart failure) ([place]) 11/04/2014  . Weakness 11/04/2014  . Hypertensive heart  disease with CHF (congestive heart failure) ([place]) 11/04/2014  . Macrocytic anemia 11/04/2014  . Cardiomyopathy, ischemic 11/04/2014  . CHF exacerbation ([place]) 11/03/2014  . Body mass index (BMI) of 20.0-20.9 in adult 06/23/2014  . Hard of hearing 06/23/2014  . Rectal bleeding 07/16/2013  . Hyperlipidemia, statin intolerance 04/30/2013  . Persistent atrial fibrillation ([place]) 04/30/2013  . Pacemaker - dual chamber Medtronic 2009 04/30/2013  . AAA (abdominal aortic aneurysm) ([place]) 04/30/2013  . Bilateral carotid artery stenosis 04/30/2013  . PAD (peripheral artery disease) ([place])  04/30/2013  . Constipation 08/31/2011  . Hypotension 08/28/2011  . S/P total hip arthroplasty 08/28/2011  . CAD (coronary artery disease) 08/28/2011  . Hypothyroid 08/28/2011  . Klebsiella cystitis 08/28/2011  . Thrombocytopenia ([place]) 08/28/2011  . SSS (sick sinus syndrome) ([place]) 10/05/2007    Past Surgical History:  Procedure Laterality Date  . CARDIAC CATHETERIZATION    . COLONOSCOPY    . CORONARY STENT PLACEMENT     x9  . FRACTURE SURGERY     femur fx, /w ORIF into HIP- 2008  . JOINT REPLACEMENT    . NM MYOCAR PERF WALL MOTION  10/08/2010   mild apical anterior ischemia  . PACEMAKER INSERTION  10/05/2007   Medtronic Adapta  . TOTAL HIP ARTHROPLASTY     planned for 08/27/2011 Left  . TOTAL HIP ARTHROPLASTY  08/27/2011   Procedure: TOTAL HIP ARTHROPLASTY;  Surgeon: Kerin Salen, MD;  Location: [facility];  Service: Orthopedics;  Laterality: Right;  . US ECHOCARDIOGRAPHY  08/23/2011   EF 50%,LA mod. dilated,mild to mod. mitral annular ca+,mod. MR,mild to mod TR,mild to mod. PH,trace AI.    OB History    No data available       Home Medications    Prior to Admission medications   Medication Sig Start Date End Date Taking? Authorizing Provider  acetaminophen (TYLENOL) 500 MG tablet Take 1 tablet (500 mg total) by mouth at bedtime. 04/26/16  Yes Danford, Valetta Fuller D, NP  amiodarone (PACERONE) 100 MG tablet Take 1 tablet (100 mg total) by mouth daily. 08/18/16  Yes Croitoru, Mihai, MD  Biotin 1000 MCG tablet Take 1 tablet (1 mg total) by mouth daily. Patient taking differently: Take 5,000 mcg by mouth daily.  04/26/16  Yes Danford, Valetta Fuller D, NP  Calcium Carbonate-Vitamin D (CALCIUM 600+D) 600-400 MG-UNIT tablet Take 1 tablet by mouth daily. 04/26/16  Yes Danford, Katy D, NP  carboxymethylcellulose 1 % ophthalmic solution Apply 1 drop to eye 3 (three) times daily. Patient taking differently: Apply 1 drop to eye 3 (three) times daily as needed (dry eyes).  04/26/16  Yes Danford, Valetta Fuller D, NP    clopidogrel (PLAVIX) 75 MG tablet TAKE 1 TABLET (75 MG TOTAL) BY MOUTH DAILY. 08/06/16  Yes Croitoru, Mihai, MD  cycloSPORINE (RESTASIS) 0.05 % ophthalmic emulsion Place 1 drop into both eyes 2 (two) times daily as needed (dryness).    Yes [provider]  diphenhydrAMINE (BENADRYL) 25 mg capsule Take 25 mg by mouth at bedtime as needed.   Yes [provider]  furosemide (LASIX) 80 MG tablet Take 1.5 tablets (120 mg total) by mouth daily. 07/28/16  Yes Croitoru, Mihai, MD  isosorbide mononitrate (IMDUR) 30 MG 24 hr tablet Take 1 tablet (30 mg total) by mouth daily. 06/24/16 09/25/16 Yes Croitoru, Mihai, MD  KLOR-CON M10 10 MEQ tablet Take 1 tablet (10 mEq total) by mouth 2 (two) times daily. 09/08/16  Yes Danford, Berna Spare, NP  levothyroxine (SYNTHROID, LEVOTHROID) 112 MCG  tablet Take 1 tablet (112 mcg total) by mouth daily before breakfast. 09/08/16  Yes Danford, Valetta Fuller D, NP  metolazone (ZAROXOLYN) 2.5 MG tablet Take 1 tablet (2.5 mg total) by mouth 3 (three) times a week. Patient taking differently: Take 2.5 mg by mouth 3 (three) times a week. Mon / Wed / Fri 07/09/16 07/18/17 Yes Croitoru, Dani Gobble, MD  nitroGLYCERIN (NITROSTAT) 0.4 MG SL tablet Place 1 tablet (0.4 mg total) under the tongue every 5 (five) minutes as needed for chest pain (For a total of 3 tablets, if chest pain persist to call 911). 10/15/14  Yes Eubanks, Carlos American, NP  ranitidine (ZANTAC) 300 MG capsule Take 1 capsule (300 mg total) by mouth every evening. 04/26/16  Yes Danford, Valetta Fuller D, NP  rOPINIRole (REQUIP) 0.25 MG tablet Take 2 tablets (0.5 mg total) by mouth at bedtime. 08/05/16  Yes Danford, Valetta Fuller D, NP  Docosanol (ABREVA) 10 % CREA Apply 1 application topically 5 (five) times daily. Apply to affected area on lip  5 x day until healed 04/26/16   Mina Marble D, NP  feeding supplement (BOOST HIGH PROTEIN) LIQD Take 1 Container by mouth See admin instructions. Drink 1 everyday and another as needed for nutrition supplement.     [provider]    Family History Family History  Problem Relation Age of Onset  . Emphysema Father   . Heart disease Mother   . Cancer Brother   . Cancer Brother   . Cancer Sister   . Cancer Sister   . Diabetes Daughter   . Atrial fibrillation Daughter   . Atrial fibrillation Daughter   . Stroke Daughter   . Heart Problems Son     Social History Social History  Substance Use Topics  . Smoking status: Former Smoker    Types: Cigarettes  . Smokeless tobacco: Never Used     Comment: quit 40 yrs ago- was smoking 2 packs a day at the most  . Alcohol use No     Allergies   Nubain [nalbuphine hcl]; Pineapple; Iodine; Mirtazapine; and Statins   Review of Systems Review of Systems  Constitutional: Positive for appetite change and fatigue. Negative for chills, diaphoresis and fever.  HENT: Negative for congestion, rhinorrhea and sneezing.   Eyes: Negative.   Respiratory: Negative for cough, chest tightness and shortness of breath.   Cardiovascular: Negative for chest pain and leg swelling.  Gastrointestinal: Positive for nausea and vomiting. Negative for abdominal pain, blood in stool and diarrhea.  Genitourinary: Negative for difficulty urinating, flank pain, frequency and hematuria.  Musculoskeletal: Negative for arthralgias and back pain.  Skin: Negative for rash.  Neurological: Positive for weakness (generalized). Negative for dizziness, speech difficulty, numbness and headaches.     Physical Exam Updated Vital Signs BP 111/60   Pulse 90   Temp 97.9 F (36.6 C) (Oral)   Resp (!) 23   SpO2 100%   Physical Exam  Constitutional: She is oriented to person, place, and time. She appears well-developed and well-nourished.  HENT:  Head: Normocephalic and atraumatic.  Eyes: Pupils are equal, round, and reactive to light.  Neck: Normal range of motion. Neck supple.  Cardiovascular: Normal rate, regular rhythm and normal heart sounds.   Pulmonary/Chest:  Effort normal and breath sounds normal. No respiratory distress. She has no wheezes. She has no rales. She exhibits no tenderness.  Abdominal: Soft. Bowel sounds are normal. There is no tenderness. There is no rebound and no guarding.  Musculoskeletal: Normal range of  motion. She exhibits no edema.  Pedal pulses not palpated but there is no temperature or color change  Lymphadenopathy:    She has no cervical adenopathy.  Neurological: She is alert and oriented to person, place, and time.  Skin: Skin is warm and dry. No rash noted.  Psychiatric: She has a normal mood and affect.     ED Treatments / Results  Labs (all labs ordered are listed, but only abnormal results are displayed) Labs Reviewed  CBC WITH DIFFERENTIAL/PLATELET - Abnormal; Notable for the following:       Result Value   WBC 11.8 (*)    MCV 104.3 (*)    Platelets 99 (*)    Neutro Abs 9.7 (*)    All other components within normal limits  COMPREHENSIVE METABOLIC PANEL - Abnormal; Notable for the following:    Potassium 2.4 (*)    Chloride 85 (*)    CO2 39 (*)    BUN 55 (*)    Creatinine, Ser 1.96 (*)    Calcium 8.8 (*)    Total Protein 6.1 (*)    Albumin 3.0 (*)    AST 44 (*)    GFR calc non Af Amer 20 (*)    GFR calc Af Amer 23 (*)    All other components within normal limits  URINALYSIS, ROUTINE W REFLEX MICROSCOPIC - Abnormal; Notable for the following:    Color, Urine STRAW (*)    All other components within normal limits  I-STAT TROPOININ, ED  I-STAT CG4 LACTIC ACID, ED    EKG  EKG Interpretation  Date/Time:  Saturday September 25 2016 11:49:16 EDT Ventricular Rate:  90 PR Interval:    QRS Duration: 132 QT Interval:  373 QTC Calculation: 457 R Axis:   -28 Text Interpretation:  Atrial fibrillation Left bundle branch block since last tracing no significant change Confirmed by Malvin Johns [phone]) on 09/25/2016 11:57:27 AM       Radiology Dg Chest 2 View  Result Date: 09/25/2016 CLINICAL DATA:   Weakness today EXAM: CHEST  2 VIEW COMPARISON:  06/13/2016 FINDINGS: The heart is markedly enlarged. Pulmonary vascularity is within normal limits. Left subclavian double lead pacemaker device and leads are stable and intact. Small pleural effusions. No pneumothorax. No evidence of consolidation or lung mass. Thoracic spine is osteopenia. There is kyphosis. IMPRESSION: Cardiomegaly without edema.  Small pleural effusions. Electronically Signed   By: Marybelle Killings M.D.   On: 09/25/2016 13:08   Dg Tibia/fibula Right  Result Date: 09/25/2016 CLINICAL DATA:  Right lower extremity inflammation, swelling, pain EXAM: RIGHT TIBIA AND FIBULA - 2 VIEW COMPARISON:  None. FINDINGS: No acute bony abnormality. Specifically, no fracture, subluxation, or dislocation. Soft tissues are intact. Mild degenerative changes in the right ankle. IMPRESSION: No acute bony abnormality. Electronically Signed   By: Rolm Baptise M.D.   On: 09/25/2016 15:06    Procedures Procedures (including critical care time)  Medications Ordered in ED Medications  potassium chloride 10 mEq in 100 mL IVPB (10 mEq Intravenous New Bag/Given 09/25/16 1354)  clindamycin (CLEOCIN) IVPB 600 mg (not administered)  sodium chloride 0.9 % bolus 500 mL (not administered)  potassium chloride SA (K-DUR,KLOR-CON) CR tablet 40 mEq (40 mEq Oral Given 09/25/16 1345)     Initial Impression / Assessment and Plan / ED Course  I have reviewed the triage vital signs and the nursing notes.  Pertinent labs & imaging results that were available during my care of the patient were reviewed by  me and considered in my medical decision making (see chart for details).     Patient is a 83 year old female who lives at home with family who presents for generalized weakness that was noticed this morning. She had an episode of hypotension with EMS but received an IV bolus prior to arrival and has had no episodes of hypotension since that time. Her heart rate is  intermittently mildly tachycardic. She has no evidence of pneumonia. No evidence of urinary tract infection. No ischemic changes on EKG. She does have evidence of right lower extremity cellulitis. Doppler ultrasound was performed which showed no evidence of DVT. She was started on clindamycin. She's also markedly hypokalemic and was started on potassium replacement. I will consult the hospitalist for admission.  I spoke with Dr. Daleen Bo who will admit the pt.  Final Clinical Impressions(s) / ED Diagnoses   Final diagnoses:  Generalized weakness  Cellulitis of right lower extremity  Hypokalemia    New Prescriptions New Prescriptions   No medications on file     Malvin Johns, MD 09/25/16 1549

## 2016-09-25 NOTE — ED Notes (Signed)
Pt c/o left arm pain. EDP made aware. IV potassium rate turned down to 75 cc/hour

## 2016-09-25 NOTE — ED Notes (Signed)
ED Provider at bedside. 

## 2016-09-26 DIAGNOSIS — E038 Other specified hypothyroidism: Secondary | ICD-10-CM

## 2016-09-26 DIAGNOSIS — I495 Sick sinus syndrome: Secondary | ICD-10-CM

## 2016-09-26 DIAGNOSIS — R531 Weakness: Secondary | ICD-10-CM

## 2016-09-26 DIAGNOSIS — I5032 Chronic diastolic (congestive) heart failure: Secondary | ICD-10-CM

## 2016-09-26 DIAGNOSIS — E876 Hypokalemia: Secondary | ICD-10-CM

## 2016-09-26 DIAGNOSIS — L03115 Cellulitis of right lower limb: Secondary | ICD-10-CM

## 2016-09-26 LAB — BASIC METABOLIC PANEL
Anion gap: 9 (ref 5–15)
BUN: 46 mg/dL — AB (ref 6–20)
CO2: 34 mmol/L — ABNORMAL HIGH (ref 22–32)
Calcium: 8.5 mg/dL — ABNORMAL LOW (ref 8.9–10.3)
Chloride: 93 mmol/L — ABNORMAL LOW (ref 101–111)
Creatinine, Ser: 1.66 mg/dL — ABNORMAL HIGH (ref 0.44–1.00)
GFR calc non Af Amer: 25 mL/min — ABNORMAL LOW (ref >60)
GFR, EST AFRICAN AMERICAN: 29 mL/min — AB (ref >60)
Glucose, Bld: 59 mg/dL — ABNORMAL LOW (ref 65–99)
POTASSIUM: 3.4 mmol/L — AB (ref 3.5–5.1)
SODIUM: 136 mmol/L (ref 135–145)

## 2016-09-26 LAB — TSH: TSH: 1.018 u[IU]/mL (ref 0.350–4.500)

## 2016-09-26 LAB — MAGNESIUM: MAGNESIUM: 1.8 mg/dL (ref 1.7–2.4)

## 2016-09-26 LAB — GLUCOSE, CAPILLARY: GLUCOSE-CAPILLARY: 100 mg/dL — AB (ref 65–99)

## 2016-09-26 IMAGING — CR DG CHEST 1V PORT
1 series · 1 of 1 positions shown · non-contrast
Comparison: 01/28/2015

CLINICAL DATA: Pulmonary edema and shortness of Breath

EXAM:
PORTABLE CHEST - 1 VIEW

[AP]
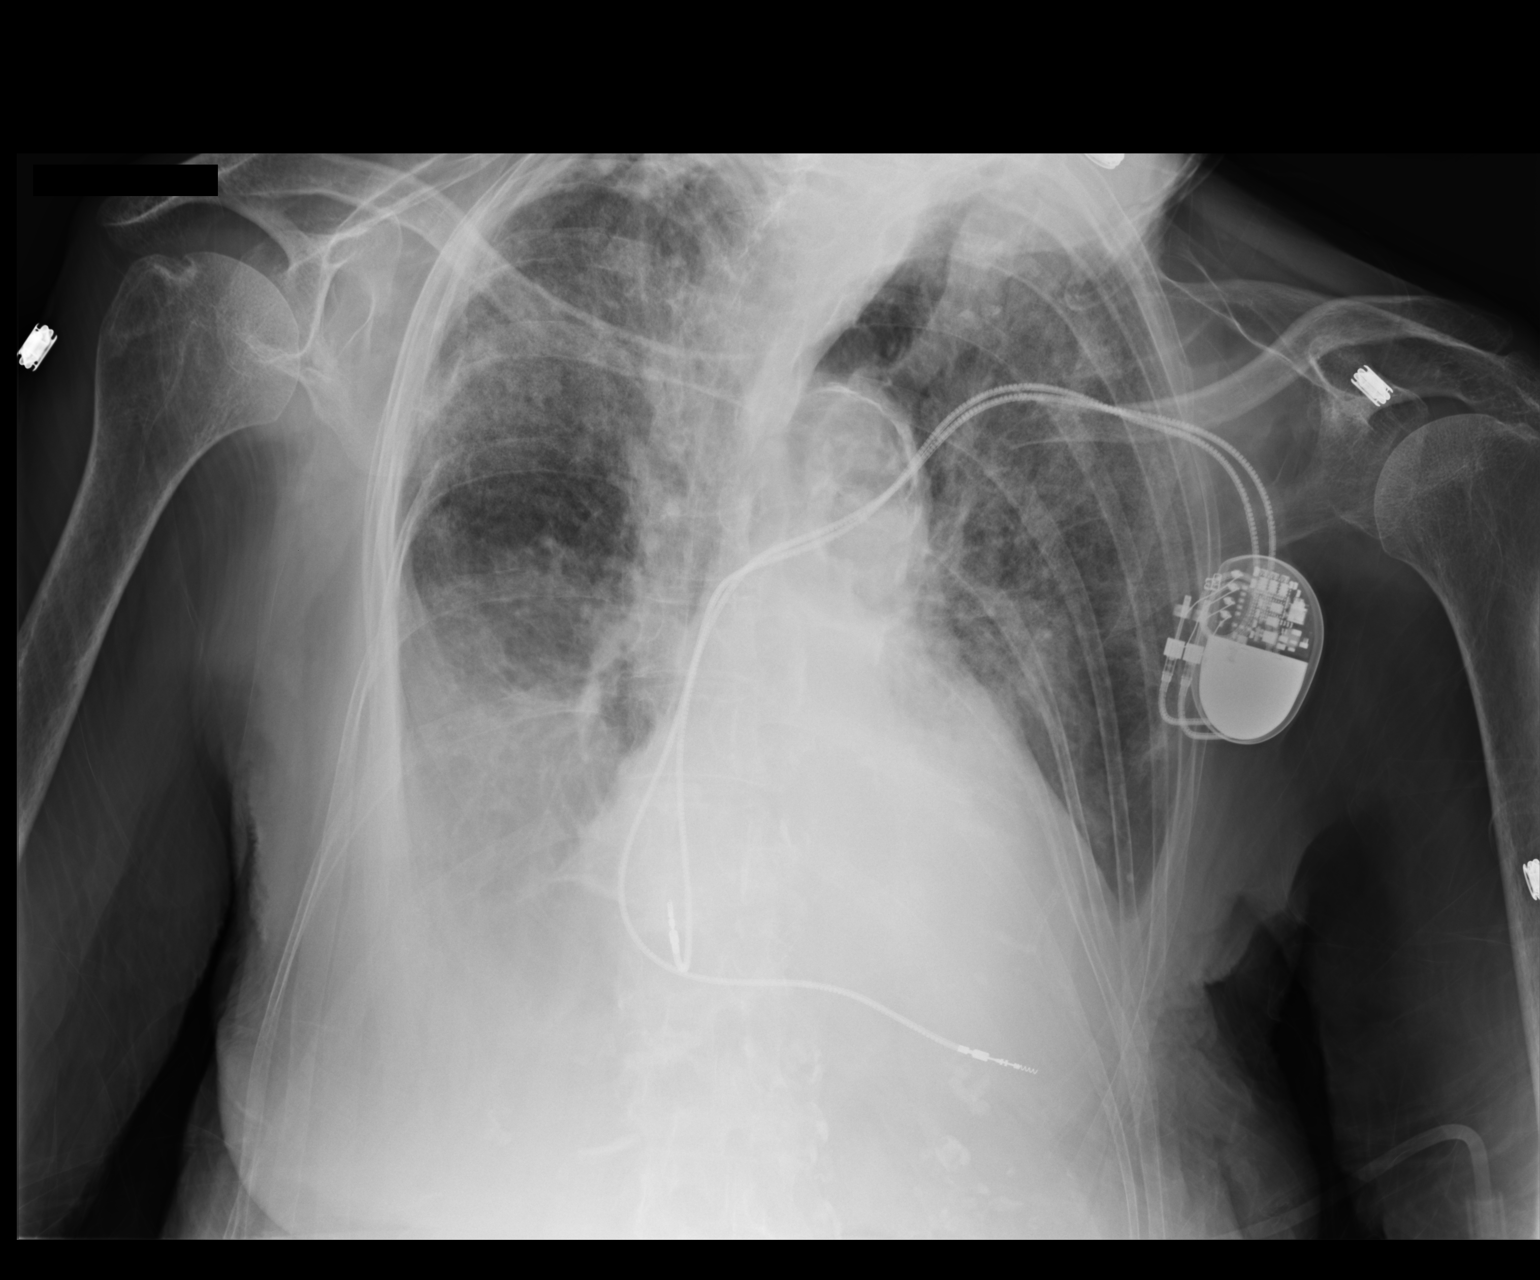

[1 of 1 positions shown; findings below may reference images not displayed]

FINDINGS: Cardiac shadow is stable. A pacing device is again seen. Aortic
calcifications are again noted. Bilateral pleural effusions are
again seen and stable. The interstitial opacities noted on the
previous exam are stable in the interval from the prior study.
IMPRESSION: No significant change from the prior study.

## 2016-09-26 MED ORDER — MAGNESIUM SULFATE IN D5W 1-5 GM/100ML-% IV SOLN
1.0000 g | Freq: Once | INTRAVENOUS | Status: AC
  Administered 2016-09-26: 1 g via INTRAVENOUS
  Filled 2016-09-26: qty 100

## 2016-09-26 MED ORDER — DEXTROSE 5 % IV SOLN
1.0000 g | INTRAVENOUS | Status: DC
  Administered 2016-09-26 – 2016-09-27 (×2): 1 g via INTRAVENOUS
  Filled 2016-09-26 (×3): qty 10

## 2016-09-26 NOTE — Progress Notes (Addendum)
PROGRESS NOTE  [name]  NTI:[id number] DOB: 14-Dec-1917 DOA: 09/25/2016 PCP: Esaw Grandchild, NP  Outpatient Specialists: Cardiology, Croitor[facility]  Brief Narrative: [name] is a 83 y.o. female with PMH of HTN, CAD, A fib not on anticogulation, s/p PPM, COPD, hypothyroidism, and chronic diastolic CHF brought by her daughter for gradually worsening diffuse weakness without focal deficits over a few days culminating in the inability to get out of bed. She has been on increasing doses of lasix and added metolazone for the past several months since an admission for volume overload in April 2018. Most recently she'd been taking lasix 120mg  (titrated from 20mg  initial dose) and metolazone 2.5mg  MWF to treat swollen legs. Her legs have been less swollen but the right leg has become red, warm and tender. On arrival she was in rate-controlled AFib, hypotensive and given IV fluids. Significantly hypokalemic and hypomagnesemic, these were repleted. Clindamycin started for cellulitis on exam and admitted for weakness. PT evaluation pending.  Assessment & Plan: Active Problems:   Hypothyroid   SSS (sick sinus syndrome) (H[facility])   Cellulitis of leg, left   COPD exacerbation ([facility])   Weakness generalized   CHF (congestive heart failure) ([facility])   Cellulitis   Hypokalemia  Right lower extremity cellulitis: Nonpurulent.  - Continue ceftriaxone, monitor clinically.   Generalized weakness: Due to overdiuresis, electrolyte derangements. Monitoring volume status and replenishing electrolytes, though I fear she's been deconditioned acutely and may require rehabilitation. No focal deficits.  - PT evaluation pending. Pt has beren to several SNFs in the past, suspect she may require this at least for short term again.  - Fall precautions  [place]ermanenet atrial fibrillation: Not on anticoagulation due to fall/bleeding risk.  - Continue amiodarone, rate controlled.   Chronic diastolic heart failure with pulmonary  HTN: Hypovolemic/overdiuresed on arrival with lasix 120mg  po daily and metolazone 2.5mg  MWF. Weight 87lbs on arrival, down from 105lbs at discharge in April.  - Restarted lasix 80mg  daily, hold metolazone. - Monitor daily weights, strict I/O, Na restriction. - Last echo was 2016, will update this.  Hypokalemia: Due to loop diuretic.  - Give more magnesium and continue scheduled potassium replacement today.  - Monitor daily  Hypothyroidism: TSH mildly suppressed at last hospitalization, recheck is normal at 1.018  Pruritus, diffuse: Chronic. LFTs/bili not sufficiently abnormal to explain symptoms, BUN elevated.  - Topical therapies for now. Discussed risk of anticholinergic/antihistamines for her and will not pursue these interventions.   DVT prophylaxis: Lovenox Code Status: DNR Family Communication: Daughter and SIL at bedside Disposition Plan: Uncertain, anticipate DC to SNF once clinically stable, ~48 hrs. Therapy evaluation pending.  Consultants:   None  Procedures:   None  Antimicrobials:  Ceftriaxone 7/14 >>   Clindamycin x1 7/14   Subjective: Pt tired, didn't sleep well. No dyspnea or chest pain, legs not tender, no fever. Feels diffusely itchy which is chronic for her.   Objective: Vitals:   09/25/16 2225 09/25/16 2259 09/26/16 0100 09/26/16 0600  BP: (!) 102/51 106/62 (!) 93/51 (!) 97/48  Pulse: 80 80 78 82  Resp: 18  18 18   Temp: 97.6 F (36.4 C)  98.1 F (36.7 C) 97.8 F (36.6 C)  TempSrc: Oral  Oral Oral  SpO2: 98%  97% 99%  Weight:    43.3 kg (95 lb 7.4 oz)  Height:        Intake/Output Summary (Last 24 hours) at 09/26/16 0934 Last data filed at 09/26/16 0910  Gross per 24 hour  Intake          2006.67 ml  Output              675 ml  Net          1331.67 ml   Filed Weights   09/25/16 1758 09/26/16 0600  Weight: 39.6 kg (87 lb 4.8 oz) 43.3 kg (95 lb 7.4 oz)    Examination: General exam: Pleasant, hard-of-hearing elderly female in no  distress  Respiratory system: Non-labored breathing room air. Clear to auscultation bilaterally.  Cardiovascular system: Irreg irreg, narrow QRS on tele, rate ~80's. No murmur, rub, or gallop. No JVD, and No pedal edema. Gastrointestinal system: Abdomen soft, non-tender, non-distended, with normoactive bowel sounds. No organomegaly or masses felt. Central nervous system: Alert and oriented. Very HOH, otherwise no focal neurological deficits. Extremities: RLE with dependent indurated erythema in lower calf without significant tenderness. No purulence of fluctuance. Scattered excoriations on both LEs. Skin: As above Psychiatry: Judgement and insight appear normal. Mood & affect appropriate.   Data Reviewed: I have personally reviewed following labs and imaging studies  CBC:  Recent Labs Lab 09/25/16 1210  WBC 11.8*  NEUTROABS 9.7*  HGB 13.1  HCT 41.6  MCV 104.3*  PLT 99*   Basic Metabolic Panel:  Recent Labs Lab 09/25/16 1210 09/26/16 0501  NA 135 136  K 2.4* 3.4*  CL 85* 93*  CO2 39* 34*  GLUCOSE 89 59*  BUN 55* 46*  CREATININE 1.96* 1.66*  CALCIUM 8.8* 8.5*  MG 1.5* 1.8   GFR: Estimated Creatinine Clearance: 13.2 mL/min (A) (by C-G formula based on SCr of 1.66 mg/dL (H)). Liver Function Tests:  Recent Labs Lab 09/25/16 1210  AST 44*  ALT 16  ALKPHOS 73  BILITOT 1.1  PROT 6.1*  ALBUMIN 3.0*   No results for input(s): LIPASE, AMYLASE in the last 168 hours. No results for input(s): AMMONIA in the last 168 hours. Coagulation Profile: No results for input(s): INR, PROTIME in the last 168 hours. Cardiac Enzymes: No results for input(s): CKTOTAL, CKMB, CKMBINDEX, TROPONINI in the last 168 hours. BNP (last 3 results) No results for input(s): PROBNP in the last 8760 hours. HbA1C: No results for input(s): HGBA1C in the last 72 hours. CBG: No results for input(s): GLUCAP in the last 168 hours. Lipid Profile: No results for input(s): CHOL, HDL, LDLCALC, TRIG,  CHOLHDL, LDLDIRECT in the last 72 hours. Thyroid Function Tests:  Recent Labs  09/26/16 0501  TSH 1.018   Anemia Panel: No results for input(s): VITAMINB12, FOLATE, FERRITIN, TIBC, IRON, RETICCTPCT in the last 72 hours. Urine analysis:    Component Value Date/Time   COLORURINE STRAW (A) 09/25/2016 1352   APPEARANCEUR CLEAR 09/25/2016 1352   LABSPEC 1.008 09/25/2016 1352   PHURINE 7.0 09/25/2016 1352   GLUCOSEU NEGATIVE 09/25/2016 1352   HGBUR NEGATIVE 09/25/2016 1352   BILIRUBINUR NEGATIVE 09/25/2016 1352   BILIRUBINUR neg 06/19/2014 1919   KETONESUR NEGATIVE 09/25/2016 1352   PROTEINUR NEGATIVE 09/25/2016 1352   UROBILINOGEN 0.2 01/26/2015 1230   NITRITE NEGATIVE 09/25/2016 1352   LEUKOCYTESUR NEGATIVE 09/25/2016 1352   No results found for this or any previous visit (from the past 240 hour(s)).    Radiology Studies: Dg Chest 2 View  Result Date: 09/25/2016 CLINICAL DATA:  Weakness today EXAM: CHEST  2 VIEW COMPARISON:  06/13/2016 FINDINGS: The heart is markedly enlarged. Pulmonary vascularity is within normal limits. Left subclavian double lead pacemaker device and leads are stable and intact. Small pleural  effusions. No pneumothorax. No evidence of consolidation or lung mass. Thoracic spine is osteopenia. There is kyphosis. IMPRESSION: Cardiomegaly without edema.  Small pleural effusions. Electronically Signed   By: Marybelle Killings M.D.   On: 09/25/2016 13:08   Dg Tibia/fibula Right  Result Date: 09/25/2016 CLINICAL DATA:  Right lower extremity inflammation, swelling, pain EXAM: RIGHT TIBIA AND FIBULA - 2 VIEW COMPARISON:  None. FINDINGS: No acute bony abnormality. Specifically, no fracture, subluxation, or dislocation. Soft tissues are intact. Mild degenerative changes in the right ankle. IMPRESSION: No acute bony abnormality. Electronically Signed   By: Rolm Baptise M.D.   On: 09/25/2016 15:06    Scheduled Meds: . amiodarone  100 mg Oral Daily  . calcium-vitamin D  1 tablet  Oral Daily  . clopidogrel  75 mg Oral Daily  . enoxaparin (LOVENOX) injection  30 mg Subcutaneous Q24H  . famotidine  10 mg Oral Daily  . feeding supplement  1 Container Oral See admin instructions  . furosemide  80 mg Oral Daily  . isosorbide mononitrate  30 mg Oral Daily  . levothyroxine  112 mcg Oral QAC breakfast  . polyvinyl alcohol  1 drop Both Eyes TID  . potassium chloride  40 mEq Oral BID  . rOPINIRole  0.5 mg Oral QHS  . sodium chloride flush  3 mL Intravenous Q12H   Continuous Infusions: . cefTRIAXone (ROCEPHIN)  IV Stopped (09/26/16 0113)  . magnesium sulfate 1 - 4 g bolus IVPB       LOS: 1 day   Time spent: 25 minutes.  Vance Gather, MD [facility] Pager [phone]  If 7PM-7AM, please contact night-coverage [email or web address] Password [facility] 09/26/2016, 9:34 AM

## 2016-09-26 NOTE — Evaluation (Signed)
Physical Therapy Evaluation Patient Details Name: [name] MRN: [id number] DOB: 1917/09/18 Today's Date: 09/26/2016   History of Present Illness  83 y.o. female with PMH of HTN, A fib not on anticogulation, s/p PPM, COPD, hypothyroidism, CHF presented with generalized weakness, fatigue, tiredness and admitted with cellulitis, hypokalemia, generalized weakness LE cellulitis, ulcerations. L>R.   Clinical Impression   Pt admitted with above diagnosis. Pt currently with functional limitations due to the deficits listed below (see PT Problem List). Lovely lady who enjoys getting up and walking; noted decr endurance toward the end of our walk;  Pt will benefit from skilled PT to increase their independence and safety with mobility to allow discharge to the venue listed below.       Follow Up Recommendations Home health PT;Supervision/Assistance - 24 hour    Equipment Recommendations  None recommended by PT    Recommendations for Other Services       Precautions / Restrictions Precautions Precautions: Fall      Mobility  Bed Mobility Overal bed mobility: Needs Assistance Bed Mobility: Supine to Sit;Sit to Supine     Supine to sit: Min guard Sit to supine: Min assist   General bed mobility comments: minguard and use of rails to go supine to sit; min assist to help LEs into bed for sit to supine  Transfers Overall transfer level: Needs assistance Equipment used: Rolling walker (2 wheeled) Transfers: Sit to/from Stand Sit to Stand: Min guard         General transfer comment: Cues for hand placement and safety  Ambulation/Gait Ambulation/Gait assistance: Min guard Ambulation Distance (Feet): 80 Feet Assistive device: Rolling walker (2 wheeled) Gait Pattern/deviations: Step-through pattern     General Gait Details: Cues to self-monitor for activity tolerance  Stairs            Wheelchair Mobility    Modified Rankin (Stroke Patients Only)       Balance  Overall balance assessment: Needs assistance           Standing balance-Leahy Scale: Fair                               Pertinent Vitals/Pain Pain Assessment: No/denies pain    Home Living Family/patient expects to be discharged to:: Private residence Living Arrangements: Children Available Help at Discharge: Family;Available PRN/intermittently Type of Home: House Home Access: Stairs to enter Entrance Stairs-Rails: Psychiatric nurse of Steps: 4 Home Layout: One level Home Equipment: Walker - 4 wheels;Cane - single point;Shower seat;Hand held shower head Additional Comments: pt has supportive children - they are there daily but not all the time.  Pt does sponge baths but has all equipment for showerng just chooses not to.  pt had [place] as recently as 4 months ago    Prior Function Level of Independence: Independent with assistive device(s)         Comments: with rollator     Hand Dominance        Extremity/Trunk Assessment   Upper Extremity Assessment Upper Extremity Assessment: Generalized weakness    Lower Extremity Assessment Lower Extremity Assessment: Generalized weakness    Cervical / Trunk Assessment Cervical / Trunk Assessment: Kyphotic  Communication   Communication: HOH  Cognition Arousal/Alertness: Awake/alert Behavior During Therapy: WFL for tasks assessed/performed Overall Cognitive Status: Within Functional Limits for tasks assessed  General Comments General comments (skin integrity, edema, etc.):   09/26/16 1625  Vital Signs  Patient Position (if appropriate) Orthostatic Vitals  Orthostatic Lying   BP- Lying 107/69  Pulse- Lying 89  Orthostatic Sitting  BP- Sitting 124/78  Pulse- Sitting 97  Orthostatic Standing at 0 minutes  BP- Standing at 0 minutes 116/81  Pulse- Standing at 0 minutes 86  Orthostatic Standing at 3 minutes  BP- Standing at 3 minutes  131/70  Pulse- Standing at 3 minutes 106       Exercises     Assessment/Plan    PT Assessment Patient needs continued PT services  PT Problem List Decreased strength;Decreased range of motion;Decreased activity tolerance;Decreased balance;Decreased mobility;Decreased knowledge of use of DME;Decreased knowledge of precautions       PT Treatment Interventions DME instruction;Gait training;Stair training;Functional mobility training;Therapeutic activities;Therapeutic exercise;Patient/family education    PT Goals (Current goals can be found in the Care Plan section)  Acute Rehab PT Goals Patient Stated Goal: get better PT Goal Formulation: With patient Time For Goal Achievement: 10/10/16 Potential to Achieve Goals: Good    Frequency Min 3X/week   Barriers to discharge        Co-evaluation               AM-PAC PT "6 Clicks" Daily Activity  Outcome Measure Difficulty turning over in bed (including adjusting bedclothes, sheets and blankets)?: A Little Difficulty moving from lying on back to sitting on the side of the bed? : A Lot Difficulty sitting down on and standing up from a chair with arms (e.g., wheelchair, bedside commode, etc,.)?: A Little Help needed moving to and from a bed to chair (including a wheelchair)?: A Little Help needed walking in hospital room?: A Little Help needed climbing 3-5 steps with a railing? : A Lot 6 Click Score: 16    End of Session Equipment Utilized During Treatment: Gait belt Activity Tolerance: Patient tolerated treatment well Patient left: in bed;with call bell/phone within reach;with family/visitor present Nurse Communication: Mobility status PT Visit Diagnosis: Unsteadiness on feet (R26.81);Difficulty in walking, not elsewhere classified (R26.2)    Time: 4944-9675 PT Time Calculation (min) (ACUTE ONLY): 25 min   Charges:   PT Evaluation $PT Eval Low Complexity: 1 Procedure PT Treatments $Gait Training: 8-22 mins   PT G  Codes:        Roney Marion, PT  Acute Rehabilitation Services Pager [phone] Office [phone]   Colletta [place] 09/26/2016, 5:28 PM

## 2016-09-26 NOTE — Progress Notes (Signed)
Pt ambulated with PT, did fine, vitals stable, 1 dose magnesium given this am, family members are in bed side and is updating, will continue to monitor

## 2016-09-27 ENCOUNTER — Inpatient Hospital Stay (HOSPITAL_COMMUNITY): Payer: Medicare HMO

## 2016-09-27 DIAGNOSIS — I361 Nonrheumatic tricuspid (valve) insufficiency: Secondary | ICD-10-CM

## 2016-09-27 LAB — ECHOCARDIOGRAM COMPLETE
HEIGHTINCHES: 63 in
Weight: 1443.2 oz

## 2016-09-27 LAB — BASIC METABOLIC PANEL
ANION GAP: 9 (ref 5–15)
BUN: 45 mg/dL — ABNORMAL HIGH (ref 6–20)
CALCIUM: 8.6 mg/dL — AB (ref 8.9–10.3)
CO2: 37 mmol/L — ABNORMAL HIGH (ref 22–32)
Chloride: 91 mmol/L — ABNORMAL LOW (ref 101–111)
Creatinine, Ser: 1.62 mg/dL — ABNORMAL HIGH (ref 0.44–1.00)
GFR, EST AFRICAN AMERICAN: 30 mL/min — AB (ref >60)
GFR, EST NON AFRICAN AMERICAN: 25 mL/min — AB (ref >60)
Glucose, Bld: 102 mg/dL — ABNORMAL HIGH (ref 65–99)
Potassium: 4 mmol/L (ref 3.5–5.1)
SODIUM: 137 mmol/L (ref 135–145)

## 2016-09-27 LAB — GLUCOSE, CAPILLARY: Glucose-Capillary: 140 mg/dL — ABNORMAL HIGH (ref 65–99)

## 2016-09-27 LAB — MAGNESIUM: MAGNESIUM: 1.9 mg/dL (ref 1.7–2.4)

## 2016-09-27 MED ORDER — CEPHALEXIN 250 MG PO CAPS: 250.0000 mg | ORAL_CAPSULE | Freq: Three times a day (TID) | ORAL | 0 refills | Status: DC

## 2016-09-27 MED ORDER — FUROSEMIDE 80 MG PO TABS: 80.0000 mg | ORAL_TABLET | Freq: Every day | ORAL | Status: DC

## 2016-09-27 MED ORDER — KLOR-CON M10 10 MEQ PO TBCR: 10.0000 meq | EXTENDED_RELEASE_TABLET | Freq: Two times a day (BID) | ORAL | 0 refills | Status: DC

## 2016-09-27 NOTE — Care Management Note (Signed)
Case Management Note  Patient Details  Name: [name] MRN: [id number] Date of Birth: June 07, 1917  Subjective/Objective:     Admitted with Cellulitis              Action/Plan: Patient lives at home with family members; PCP: Esaw Grandchild, NP; has private insurance with [facility] HMO with prescription drug coverage; patient is active with [facility] for [facility] services as prior to admission; family plans to go out of town, [place] gave daughter a Adult nurse for additional services in the home.  Expected Discharge Date:    possibly 09/27/2016              Expected Discharge Plan:  [place]  In-House Referral:   [facility]  Discharge planning Services  CM Consult  Choice offered to:  Adult Children  HH Arranged:  RN, PT, OT, Nurse's Aide [place] Agency:  [place]  Status of Service:  In process, will continue to follow  Sherrilyn Rist [phone] 09/27/2016, 10:12 AM

## 2016-09-27 NOTE — Progress Notes (Signed)
Pt has a discharge order but waiting for the MD to call them about ECHO result done today, family members refusing to be discharged until they hear about result, RN checked and there is no result so far

## 2016-09-27 NOTE — Consult Note (Signed)
   [facility] CM Inpatient Consult   09/27/2016  [name] [birth year] [id number]  Patient is currently active with [facility] Care Management for chronic disease management services with [facility].  Patient has been engaged by a [employer].  Our community based plan of care has focused on disease management and community resource support. Patient was previously active with [facility] for home health as well as palliative care prior to admission with [facility]osp[facility] and [place]. Met with the patient and daughter [name] regarding [facility] ongoing follow up needs.  Daughter states that [place] with [facility]osp[facility] and [place] has been out once to the home and was trying to get up with him for ongoing support as she is making arrangements for her mother for coverage for their own trip to [place]. Notified [facility] team regarding patient's admission.   Made Inpatient Case Manager aware that [place] Management following til hospice/palliative program is involved. Of note, [facility] Care Management services does not replace or interfere with any services that are needed or arranged by inpatient case management or social work.  For additional questions or referrals please contact:  Natividad Brood, RN BSN [facility] Liaison  [phone] business mobile phone Toll free office [phone]

## 2016-09-27 NOTE — Progress Notes (Signed)
  Echocardiogram 2D Echocardiogram has been performed.  Donata Clay 09/27/2016, 11:33 AM

## 2016-09-27 NOTE — Evaluation (Signed)
Occupational Therapy Evaluation Patient Details Name: [name] MRN: [id number] DOB: [birth year] Today's Date: 09/27/2016    History of Present Illness 83 y.o. female with PMH of HTN, A fib not on anticogulation, s/p PPM, COPD, hypothyroidism, CHF presented with generalized weakness, fatigue, tiredness and admitted with cellulitis, hypokalemia, generalized weakness LE cellulitis, ulcerations. L>R.    Clinical Impression   This 83 y/o F presents with the above. Pt lives with family, at baseline she receives supervision for ADL completion, and completes functional mobility using rollator. Pt currently requires ModA for LB ADLs, MinA for functional mobility using RW. Pt will benefit from continued OT services in acute and post acute settings to maximize Pt's safety and independence with ADLs, functional mobility and to decrease level of caregiver assist after returning home. Will follow.     Follow Up Recommendations  SNF;Supervision/Assistance - 24 hour (HHOT if Pt returning home)    Equipment Recommendations  None recommended by OT           Precautions / Restrictions Precautions Precautions: Fall Restrictions Weight Bearing Restrictions: No      Mobility Bed Mobility               General bed mobility comments: Pt sitting EOB upon entering room   Transfers Overall transfer level: Needs assistance Equipment used: Rolling walker (2 wheeled) Transfers: Sit to/from Stand Sit to Stand: Min assist         General transfer comment: Cues for hand placement and safety; assist to rise    Balance Overall balance assessment: Needs assistance Sitting-balance support: Feet supported Sitting balance-Leahy Scale: Fair     Standing balance support: During functional activity;No upper extremity supported;Bilateral upper extremity supported Standing balance-Leahy Scale: Fair Standing balance comment: while washing hands/face standing at sink                             ADL either performed or assessed with clinical judgement   ADL Overall ADL's : Needs assistance/impaired Eating/Feeding: Set up;Sitting   Grooming: Wash/dry hands;Wash/dry face;Min guard;Standing   Upper Body Bathing: Min guard;Sitting   Lower Body Bathing: Minimal assistance;Sit to/from stand   Upper Body Dressing : Minimal assistance;Sitting Upper Body Dressing Details (indicate cue type and reason): donning buttonup sweater  Lower Body Dressing: Moderate assistance;Sit to/from stand   Toilet Transfer: Minimal assistance;Ambulation;BSC;RW   Toileting- Clothing Manipulation and Hygiene: Sit to/from stand;Minimal assistance Toileting - Clothing Manipulation Details (indicate cue type and reason): completed peri hygiene using lateral leans; minA to steady during standing for clothing/gown management      Functional mobility during ADLs: Min guard;Rolling walker General ADL Comments: Pt completed brief room level functional mobility to complete toileting and standing grooming ADLs using RW; Pt sitting EOB upon entering room, daughter reporting BP has been running low - BP taken while sitting EOB, reading 87/63, taken in sitting after ADL completion and room level functional mobility reading 108/69, pt reports general weakness, no other s/s noted during session      Vision Baseline Vision/History: Wears glasses Wears Glasses:  (for watching TV) Patient Visual Report: No change from baseline Vision Assessment?: No apparent visual deficits                Pertinent Vitals/Pain Pain Assessment: No/denies pain          Extremity/Trunk Assessment Upper Extremity Assessment Upper Extremity Assessment: Generalized weakness   Lower Extremity Assessment Lower Extremity Assessment: Defer to  PT evaluation   Cervical / Trunk Assessment Cervical / Trunk Assessment: Kyphotic   Communication Communication Communication: HOH   Cognition Arousal/Alertness:  Awake/alert Behavior During Therapy: WFL for tasks assessed/performed Overall Cognitive Status: Within Functional Limits for tasks assessed                                     General Comments  daughter present during session                Home Living Family/patient expects to be discharged to:: Private residence Living Arrangements: Children Available Help at Discharge: Family;Available PRN/intermittently Type of Home: House Home Access: Stairs to enter [employer] of Steps: 4 Entrance Stairs-Rails: Right;Left Home Layout: One level     Bathroom Shower/Tub: Occupational psychologist: Standard     Home Equipment: Environmental consultant - 4 wheels;Cane - single point;Shower seat;Hand held shower head   Additional Comments: pt has supportive children - they are there daily but not all the time.  Pt does sponge baths but has all equipment for showerng just chooses not to.  pt had [place] as recently as 4 months ago      Prior Functioning/Environment Level of Independence: Independent with assistive device(s)        Comments: with rollator; Pt's daughter reports she provides supervision during ADL completion         OT Problem List: Decreased strength;Decreased activity tolerance      OT Treatment/Interventions: Self-care/ADL training;DME and/or AE instruction;Therapeutic activities;Balance training;Therapeutic exercise;Energy conservation;Patient/family education    OT Goals(Current goals can be found in the care plan section) Acute Rehab OT Goals Patient Stated Goal: get better OT Goal Formulation: With patient Time For Goal Achievement: 10/11/16 Potential to Achieve Goals: Good ADL Goals Pt Will Perform Grooming: standing;with supervision Pt Will Perform Upper Body Bathing: with supervision;sitting Pt Will Perform Upper Body Dressing: with set-up;sitting Pt Will Perform Lower Body Dressing: with supervision;sit to/from stand Pt Will Transfer to  Toilet: with supervision;ambulating;regular height toilet Pt Will Perform Toileting - Clothing Manipulation and hygiene: with supervision;sit to/from stand  OT Frequency: Min 2X/week                             AM-PAC PT "6 Clicks" Daily Activity     Outcome Measure Help from another person eating meals?: None Help from another person taking care of personal grooming?: A Little Help from another person toileting, which includes using toliet, bedpan, or urinal?: A Little Help from another person bathing (including washing, rinsing, drying)?: A Lot Help from another person to put on and taking off regular upper body clothing?: A Little Help from another person to put on and taking off regular lower body clothing?: A Lot 6 Click Score: 17   End of Session Equipment Utilized During Treatment: Gait belt;Rolling walker  Activity Tolerance: Patient tolerated treatment well Patient left: in chair;with call bell/phone within reach;with family/visitor present  OT Visit Diagnosis: Muscle weakness (generalized) (M62.81);Unsteadiness on feet (R26.81)                Time: 2979-8921 OT Time Calculation (min): 37 min Charges:  OT General Charges $OT Visit: 1 Procedure OT Evaluation $OT Eval Low Complexity: 1 Procedure OT Treatments $Self Care/Home Management : 8-22 mins G-Codes:     Lou Cal, OT Pager [phone] 09/27/2016   Raymondo Band  09/27/2016, 11:31 AM

## 2016-09-27 NOTE — Discharge Summary (Signed)
Physician Discharge Summary  LABRINA LINES CHE:[id number] DOB: 1918/01/31 DOA: 09/25/2016  PCP: Esaw Grandchild, NP  Admit date: 09/25/2016 Discharge date: 09/27/2016  Admitted From: Home Disposition: Home   Recommendations for Outpatient Follow-up:  1. Follow up with PCP and cardiology as below. 2. Please obtain BMP and magnesium at follow up, scheduled in 48 hours. Discharged on lasix 80mg  daily (down from 120mg ) and holding metolazone (was taking MWF) and told to take potassium 47mEq daily dose.  3. Monitor blood pressure, chronically soft BPs noted. Continued imdur.  4. Monitor RLE cellulitis, discharged on keflex.  5. Follow up on echocardiogram performed but not interpreted prior to discharge.   Home Health: PT, OT, RN, aide Equipment/Devices: None new Discharge Condition: Stable CODE STATUS: DNR Diet recommendation: Heart healthy  Brief/Interim Summary: [name] a 83 y.o.femalewith PMH of HTN, CAD, A fib not on anticogulation, s/p PPM, COPD, hypothyroidism, and chronic diastolic CHF brought by her daughter for gradually worsening diffuse weakness without focal deficits over a few days culminating in the inability to get out of bed. She has been on increasing doses of lasix and added metolazone for the past several months since an admission for volume overload in April 2018. Most recently she'd been taking lasix 120mg  (titrated from 20mg  initial dose) and metolazone 2.5mg  MWF to treat swollen legs. Her legs have been less swollen but the right leg has become red, warm and tender. On arrival she was in rate-controlled AFib, hypotensive and given IV fluids. Significantly hypokalemic and hypomagnesemic, these were repleted and are within normal limits on discharge. Clindamycin started for cellulitis on exam and admitted for weakness. Antibitiocs were changed to ceftriaxone with improvement. Weakness improved. PT and OT recommended 24 hour supervision at home.  Discharge Diagnoses:   Active Problems:   Hypothyroid   SSS (sick sinus syndrome) (HCC)   Cellulitis of leg, left   COPD exacerbation (HCC)   Weakness generalized   CHF (congestive heart failure) (HCC)   Cellulitis   Hypokalemia  Right lower extremity cellulitis: Nonpurulent.  - Improved significantly with ceftriaxone, will continue keflex renally dosed.   Generalized weakness: Due to overdiuresis, electrolyte derangements. Monitoring volume status and replenishing electrolytes, though I fear she's been deconditioned acutely and may require rehabilitation. No focal deficits.  - Improved with correction of electrolyte derangements.  - Home health as above  [place]ermanenet atrial fibrillation: Not on anticoagulation due to fall/bleeding risk.  - Continue amiodarone, rate controlled.   Chronic diastolic heart failure with pulmonary HTN: Hypovolemic/overdiuresed on arrival with lasix 120mg  po daily and metolazone 2.5mg  MWF. Weight 87lbs on arrival, down from 105lbs at discharge in April.  - Restarted lasix 80mg  daily, holding metolazone. - Na restriction. - Last echo was 2016, this was repeated, though it will not change management acutely as she is not overloaded.  Hypokalemia: Due to loop diuretic.  - Resolved, will continue potassium daily (52mEq) cautiously with renal impairment.  - We've scheduled follow up in the next 48 hours for follow up labs and reassessment of volume status.   Hypothyroidism: TSH mildly suppressed at last hospitalization, recheck is normal at 1.018  Pruritus, diffuse: Chronic. LFTs/bili not sufficiently abnormal to explain symptoms, BUN elevated.  - Topical therapies for now. Discussed risk of anticholinergic/antihistamines for her and will not pursue these interventions.   Discharge Instructions Discharge Instructions    (HEART FAILURE PATIENTS) Call MD:  Anytime you have any of the following symptoms: 1) 3 pound weight gain in 24 hours  or 5 pounds in 1 week 2) shortness of  breath, with or without a dry hacking cough 3) swelling in the hands, feet or stomach 4) if you have to sleep on extra pillows at night in order to breathe.    Complete by:  As directed    Call MD for:  temperature >100.4    Complete by:  As directed    Discharge instructions    Complete by:  As directed    You were admitted for weakness and low potassium due to overdiuresis (due to too much lasix and metolazone) and found to have cellulitis of the right lower leg. Symptoms have improved as we've decreased the dose of lasix and held metolazone. You are stable for discharge with the following recommendations:  - Take lasix 80mg  daily and take potassium 12mEq (1 tablet) twice daily as well.  - Stop taking metolazone until you follow up with your doctor. - Weight yourself daily. If your weight goes up by 3 pounds in a day or 5 pounds in a week, you should take a metolazone and call your doctor.  - For the cellulitis, take keflex three times daily for 6 days. - You will receive home health PT, OT, nurse, and aide to be arranged by care management.  - The echocardiogram was performed and will unfortunately not be read until later today or this evening. Because it will not change the outcome of our treatment, you may be discharged prior to knowing the results. Your doctor will have access to this report and will communicate and interpret the results for you.     Allergies as of 09/27/2016      Reactions   Nubain [nalbuphine Hcl] Anaphylaxis   Pineapple Other (See Comments)   Listed on [facility] 06/2015   Iodine Rash   Mirtazapine Other (See Comments)   "Weakness in legs"   Statins Other (See Comments)   Leg weakness      Medication List    STOP taking these medications   metolazone 2.5 MG tablet Commonly known as:  ZAROXOLYN     TAKE these medications   acetaminophen 500 MG tablet Commonly known as:  TYLENOL Take 1 tablet (500 mg total) by mouth at bedtime.   amiodarone 100 MG  tablet Commonly known as:  PACERONE Take 1 tablet (100 mg total) by mouth daily.   Biotin 1000 MCG tablet Take 1 tablet (1 mg total) by mouth daily. What changed:  how much to take   Calcium Carbonate-Vitamin D 600-400 MG-UNIT tablet Commonly known as:  CALCIUM 600+D Take 1 tablet by mouth daily.   carboxymethylcellulose 1 % ophthalmic solution Apply 1 drop to eye 3 (three) times daily. What changed:  when to take this  reasons to take this   cephALEXin 250 MG capsule Commonly known as:  KEFLEX Take 1 capsule (250 mg total) by mouth 3 (three) times daily.   clopidogrel 75 MG tablet Commonly known as:  PLAVIX TAKE 1 TABLET (75 MG TOTAL) BY MOUTH DAILY.   cycloSPORINE 0.05 % ophthalmic emulsion Commonly known as:  RESTASIS Place 1 drop into both eyes 2 (two) times daily as needed (dryness).   diphenhydrAMINE 25 mg capsule Commonly known as:  BENADRYL Take 25 mg by mouth at bedtime as needed.   Docosanol 10 % Crea Commonly known as:  ABREVA Apply 1 application topically 5 (five) times daily. Apply to affected area on lip  5 x day until healed   feeding supplement Liqd Take  1 Container by mouth See admin instructions. Drink 1 everyday and another as needed for nutrition supplement.   furosemide 80 MG tablet Commonly known as:  LASIX Take 1 tablet (80 mg total) by mouth daily. What changed:  how much to take   isosorbide mononitrate 30 MG 24 hr tablet Commonly known as:  IMDUR Take 1 tablet (30 mg total) by mouth daily.   KLOR-CON M10 10 MEQ tablet Generic drug:  potassium chloride Take 1 tablet (10 mEq total) by mouth 2 (two) times daily.   levothyroxine 112 MCG tablet Commonly known as:  SYNTHROID, LEVOTHROID Take 1 tablet (112 mcg total) by mouth daily before breakfast.   nitroGLYCERIN 0.4 MG SL tablet Commonly known as:  NITROSTAT Place 1 tablet (0.4 mg total) under the tongue every 5 (five) minutes as needed for chest pain (For a total of 3 tablets, if  chest pain persist to call 911).   ranitidine 300 MG capsule Commonly known as:  ZANTAC Take 1 capsule (300 mg total) by mouth every evening.   rOPINIRole 0.25 MG tablet Commonly known as:  REQUIP Take 2 tablets (0.5 mg total) by mouth at bedtime.      Follow-up Information    [facility] Follow up.   Specialty:  [place] Why:  They will continue to do your home health care at your home Contact information: [place] [id number] [phone]        Esaw Grandchild, NP. Go on 09/29/2016.   Specialty:  Family Medicine Why:  @1 :15pm Contact information: [place] [id number] [phone]        Lonn Georgia, PA-C Follow up on 10/13/2016.   Specialties:  Cardiology, Radiology Why:  8:30am Contact information: [address]  [place] [id number] [phone]          Allergies  Allergen Reactions  . Nubain [Nalbuphine Hcl] Anaphylaxis  . Pineapple Other (See Comments)    Listed on [facility] 06/2015  . Iodine Rash  . Mirtazapine Other (See Comments)    "Weakness in legs"  . Statins Other (See Comments)    Leg weakness    Consultations:  None  Procedures/Studies: Dg Chest 2 View  Result Date: 09/25/2016 CLINICAL DATA:  Weakness today EXAM: CHEST  2 VIEW COMPARISON:  06/13/2016 FINDINGS: The heart is markedly enlarged. Pulmonary vascularity is within normal limits. Left subclavian double lead pacemaker device and leads are stable and intact. Small pleural effusions. No pneumothorax. No evidence of consolidation or lung mass. Thoracic spine is osteopenia. There is kyphosis. IMPRESSION: Cardiomegaly without edema.  Small pleural effusions. Electronically Signed   By: Marybelle Killings M.D.   On: 09/25/2016 13:08   Dg Tibia/fibula Right  Result Date: 09/25/2016 CLINICAL DATA:  Right lower extremity inflammation, swelling, pain EXAM: RIGHT TIBIA AND FIBULA - 2 VIEW COMPARISON:  None. FINDINGS: No acute  bony abnormality. Specifically, no fracture, subluxation, or dislocation. Soft tissues are intact. Mild degenerative changes in the right ankle. IMPRESSION: No acute bony abnormality. Electronically Signed   By: Rolm Baptise M.D.   On: 09/25/2016 15:06   Subjective: Feels well, weakness improved drastically. Still unsteady and requires assistance, but this is her baseline.   Discharge Exam: Vitals:   09/27/16 0950 09/27/16 1123  BP: (!) 105/56 92/66  Pulse: 75 84  Resp:    Temp:  (!) 97.4 F (36.3 C)   General: Pleasant elderly female in no distress Cardiovascular: Irreg  irreg, narrow QRS on tele, rate ~80's. No murmur, rub, or gallop. No JVD, and No pedal edema. Respiratory: CTA bilaterally, no wheezing, no rhonchi Abdominal: Soft, NT, ND, bowel sounds + Extremities: RLE with dependent indurated erythema in lower calf without significant tenderness. No purulence of fluctuance. Scattered excoriations on both LEs.  Labs: Basic Metabolic Panel:  Recent Labs Lab 09/25/16 1210 09/26/16 0501 09/27/16 0359  NA 135 136 137  K 2.4* 3.4* 4.0  CL 85* 93* 91*  CO2 39* 34* 37*  GLUCOSE 89 59* 102*  BUN 55* 46* 45*  CREATININE 1.96* 1.66* 1.62*  CALCIUM 8.8* 8.5* 8.6*  MG 1.5* 1.8 1.9   Liver Function Tests:  Recent Labs Lab 09/25/16 1210  AST 44*  ALT 16  ALKPHOS 73  BILITOT 1.1  PROT 6.1*  ALBUMIN 3.0*   CBC:  Recent Labs Lab 09/25/16 1210  WBC 11.8*  NEUTROABS 9.7*  HGB 13.1  HCT 41.6  MCV 104.3*  PLT 99*   Thyroid function studies  Recent Labs  09/26/16 0501  TSH 1.018   Urinalysis    Component Value Date/Time   COLORURINE STRAW (A) 09/25/2016 1352   APPEARANCEUR CLEAR 09/25/2016 1352   LABSPEC 1.008 09/25/2016 1352   PHURINE 7.0 09/25/2016 1352   GLUCOSEU NEGATIVE 09/25/2016 1352   HGBUR NEGATIVE 09/25/2016 1352   BILIRUBINUR NEGATIVE 09/25/2016 1352   BILIRUBINUR neg 06/19/2014 1919   KETONESUR NEGATIVE 09/25/2016 1352   PROTEINUR NEGATIVE  09/25/2016 1352   UROBILINOGEN 0.2 01/26/2015 1230   NITRITE NEGATIVE 09/25/2016 1352   LEUKOCYTESUR NEGATIVE 09/25/2016 1352   Time coordinating discharge: Approximately 40 minutes  Vance Gather, MD  [facility] 09/27/2016, 12:19 PM Pager [phone]

## 2016-09-27 NOTE — Discharge Summary (Signed)
Pt got discharged to home, discharge instructions provided and patient showed understanding to it, IV taken out,Telemonitor DC,pt left unit in wheelchair with all of the belongings accompanied with a family member (Daughter) 

## 2016-09-28 DIAGNOSIS — Z950 Presence of cardiac pacemaker: Secondary | ICD-10-CM | POA: Diagnosis not present

## 2016-09-28 DIAGNOSIS — I5032 Chronic diastolic (congestive) heart failure: Secondary | ICD-10-CM | POA: Diagnosis not present

## 2016-09-28 DIAGNOSIS — R2681 Unsteadiness on feet: Secondary | ICD-10-CM | POA: Diagnosis not present

## 2016-09-28 DIAGNOSIS — I25119 Atherosclerotic heart disease of native coronary artery with unspecified angina pectoris: Secondary | ICD-10-CM | POA: Diagnosis not present

## 2016-09-28 DIAGNOSIS — I110 Hypertensive heart disease with heart failure: Secondary | ICD-10-CM | POA: Diagnosis not present

## 2016-09-28 DIAGNOSIS — J449 Chronic obstructive pulmonary disease, unspecified: Secondary | ICD-10-CM | POA: Diagnosis not present

## 2016-09-28 DIAGNOSIS — M6281 Muscle weakness (generalized): Secondary | ICD-10-CM | POA: Diagnosis not present

## 2016-09-28 DIAGNOSIS — I4891 Unspecified atrial fibrillation: Secondary | ICD-10-CM | POA: Diagnosis not present

## 2016-09-29 ENCOUNTER — Encounter: Payer: Self-pay | Admitting: Adult Health

## 2016-09-29 ENCOUNTER — Other Ambulatory Visit: Payer: Self-pay

## 2016-09-29 ENCOUNTER — Ambulatory Visit (INDEPENDENT_AMBULATORY_CARE_PROVIDER_SITE_OTHER): Payer: Medicare HMO | Admitting: Adult Health

## 2016-09-29 VITALS — BP 97/65 | HR 102 | Ht 62.0 in | Wt 96.2 lb

## 2016-09-29 DIAGNOSIS — G2581 Restless legs syndrome: Secondary | ICD-10-CM

## 2016-09-29 DIAGNOSIS — N183 Chronic kidney disease, stage 3 unspecified: Secondary | ICD-10-CM

## 2016-09-29 DIAGNOSIS — I481 Persistent atrial fibrillation: Secondary | ICD-10-CM | POA: Diagnosis not present

## 2016-09-29 DIAGNOSIS — I25118 Atherosclerotic heart disease of native coronary artery with other forms of angina pectoris: Secondary | ICD-10-CM | POA: Diagnosis not present

## 2016-09-29 DIAGNOSIS — I950 Idiopathic hypotension: Secondary | ICD-10-CM

## 2016-09-29 DIAGNOSIS — R0602 Shortness of breath: Secondary | ICD-10-CM

## 2016-09-29 DIAGNOSIS — L299 Pruritus, unspecified: Secondary | ICD-10-CM

## 2016-09-29 DIAGNOSIS — R531 Weakness: Secondary | ICD-10-CM

## 2016-09-29 DIAGNOSIS — I5032 Chronic diastolic (congestive) heart failure: Secondary | ICD-10-CM

## 2016-09-29 DIAGNOSIS — I4819 Other persistent atrial fibrillation: Secondary | ICD-10-CM

## 2016-09-29 NOTE — Progress Notes (Signed)
Subjective:    Patient ID: [name], female    DOB: 1917/12/16, 83 y.o.   MRN: [id number]  HPI:  Ms. Lorensen is here for hospital f/u-d/c'd on 09/27/16: Brief/Interim Summary: Marella Chimes a 83 y.o.femalewith PMH of HTN, CAD, A fib not on anticogulation, s/p PPM, COPD, hypothyroidism, and chronic diastolic CHF brought by her daughter for gradually worsening diffuse weakness without focal deficits over a few days culminating in the inability to get out of bed. She has been on increasing doses of lasix and added metolazone for the past several months since an admission for volume overload in April 2018. Most recently she'd been taking lasix 120mg  (titrated from 20mg  initial dose) and metolazone 2.5mg  MWF to treat swollen legs. Her legs have been less swollen but the right leg has become red, warm and tender. On arrival she was in rate-controlled AFib, hypotensive and given IV fluids. Significantly hypokalemic and hypomagnesemic, these were repleted and are within normal limits on discharge. Clindamycin started for cellulitis on exam and admitted for weakness. Antibitiocs were changed to ceftriaxone with improvement. Weakness improved. PT and OT recommended 24 hour supervision at home.  She reports decreased lower ext edema, decreased SOB, increased energy, resolved nausea, and improved appetite.  Home health has already established with her and will visit at least twice weekly/PRN. She will complete Keflex (RLE cellulitis) on Sunday.  She reports feeling better "than I have in months!".  Daughter at [facility] and she reports travelling out of town tomorrow for 2 weeks, however other family members that already live with Ms. Buchta will be remain home with her during her absence.  Patient Care Team    Relationship Specialty Notifications Start End  Mina Marble D, NP PCP - General Family Medicine  03/25/16   Croitoru, Dani Gobble, MD Consulting Physician Cardiology  03/25/16   Alfonzo Feller, [place] Management  Admissions 06/16/16   Clarene Essex, MD Consulting Physician Gastroenterology  08/27/16   Hurman Horn, MD Consulting Physician Ophthalmology  08/27/16     Patient Active Problem List   Diagnosis Date Noted  . Cellulitis 09/25/2016  . Hypokalemia 09/25/2016  . Itching 08/05/2016  . Abnormal TSH 06/23/2016  . CHF (congestive heart failure) ([place]) 06/14/2016  . COPD (chronic obstructive pulmonary disease) ([place]) 11/29/2015  . Abdominal aortic aneurysm (AAA) without rupture ([place]) 08/21/2015  . DNR (do not resuscitate)   . Weakness generalized   . GERD (gastroesophageal reflux disease) 07/04/2015  . Dysphagia 07/04/2015  . Transaminitis 06/24/2015  . HCAP (healthcare-associated pneumonia) 06/22/2015  . Pneumonia 06/22/2015  . COPD exacerbation ([place]) 06/21/2015  . Cough 06/09/2015  . Fever, unspecified 06/09/2015  . Chronic diastolic congestive heart failure ([place]) 04/24/2015  . Restless legs 04/24/2015  . Pain and swelling of left lower extremity 02/15/2015  . Candidiasis of perineum 02/15/2015  . Hemorrhoids 02/15/2015  . CKD (chronic kidney disease) stage 3, GFR 30-59 ml/min 02/06/2015  . Acute respiratory failure with hypoxia ([place]) 01/29/2015  . Acute pulmonary edema ([place]) 01/29/2015  . SOB (shortness of breath)   . Palliative care encounter   . Sepsis ([place]) 01/26/2015  . Cellulitis of leg, left 01/26/2015  . Protein-calorie malnutrition, severe ([place]) 11/06/2014  . Pressure ulcer 11/06/2014  . Acute diastolic CHF (congestive heart failure) ([place]) 11/04/2014  . Weakness 11/04/2014  . Hypertensive heart disease with CHF (congestive heart failure) ([place]) 11/04/2014  . Macrocytic anemia 11/04/2014  . Cardiomyopathy, ischemic 11/04/2014  . CHF exacerbation (  [place]) 11/03/2014  . Body mass index (BMI) of 20.0-20.9 in adult 06/23/2014  . Hard of hearing 06/23/2014  . Rectal bleeding 07/16/2013  . Hyperlipidemia, statin intolerance 04/30/2013  .  Persistent atrial fibrillation ([place]) 04/30/2013  . Pacemaker - dual chamber Medtronic 2009 04/30/2013  . AAA (abdominal aortic aneurysm) ([place]) 04/30/2013  . Bilateral carotid artery stenosis 04/30/2013  . PAD (peripheral artery disease) ([place]) 04/30/2013  . Constipation 08/31/2011  . Hypotension 08/28/2011  . S/P total hip arthroplasty 08/28/2011  . CAD (coronary artery disease) 08/28/2011  . Hypothyroid 08/28/2011  . Klebsiella cystitis 08/28/2011  . Thrombocytopenia ([place]) 08/28/2011  . SSS (sick sinus syndrome) ([place]) 10/05/2007     Past Medical History:  Diagnosis Date  . Abdominal aortic aneurysm ([place])   . Anemia    hx  . Asthma    hx  . Atrial fibrillation ([place])   . Blood transfusion   . Blood transfusion without reported diagnosis   . CAD (coronary artery disease)   . CHF (congestive heart failure) ([place])   . COPD (chronic obstructive pulmonary disease) ([place])   . GERD (gastroesophageal reflux disease)   . GI bleed 2007  . H/O hiatal hernia   . Heart attack ([place])   . Heart disease   . History of right hip replacement 2013  . Hyperlipidemia   . Hypertension   . Hypothyroidism   . Myocardial infarction ([place])   . Osteoporosis   . Pacemaker   . Renal failure, chronic   . SSS (sick sinus syndrome) ([place]) 10/05/2007   Medtronic Adapta  . Stroke ([place])   . TIA (transient ischemic attack)   . UTI (lower urinary tract infection)    hx     Past Surgical History:  Procedure Laterality Date  . CARDIAC CATHETERIZATION    . COLONOSCOPY    . CORONARY STENT PLACEMENT     x9  . FRACTURE SURGERY     femur fx, /w ORIF into HIP- 2008  . JOINT REPLACEMENT    . NM MYOCAR PERF WALL MOTION  10/08/2010   mild apical anterior ischemia  . PACEMAKER INSERTION  10/05/2007   Medtronic Adapta  . TOTAL HIP ARTHROPLASTY     planned for 08/27/2011 Left  . TOTAL HIP ARTHROPLASTY  08/27/2011   Procedure: TOTAL HIP ARTHROPLASTY;  Surgeon: Kerin Salen, MD;  Location: [facility];  Service:  Orthopedics;  Laterality: Right;  . US ECHOCARDIOGRAPHY  08/23/2011   EF 50%,LA mod. dilated,mild to mod. mitral annular ca+,mod. MR,mild to mod TR,mild to mod. PH,trace AI.     Family History  Problem Relation Age of Onset  . Emphysema Father   . Heart disease Mother   . Cancer Brother   . Cancer Brother   . Cancer Sister   . Cancer Sister   . Diabetes Daughter   . Atrial fibrillation Daughter   . Atrial fibrillation Daughter   . Stroke Daughter   . Heart Problems Son      History  Drug Use No     History  Alcohol Use No     History  Smoking Status  . Former Smoker  . Types: Cigarettes  Smokeless Tobacco  . Never Used    Comment: quit 40 yrs ago- was smoking 2 packs a day at the most     Outpatient Encounter Prescriptions as of 09/29/2016  Medication Sig  . acetaminophen (TYLENOL) 500 MG tablet Take 1 tablet (500 mg total) by mouth at bedtime.  [name] amiodarone (  PACERONE) 100 MG tablet Take 1 tablet (100 mg total) by mouth daily.  . Biotin 1000 MCG tablet Take 1 tablet (1 mg total) by mouth daily. (Patient taking differently: Take 5,000 mcg by mouth daily. )  . Calcium Carbonate-Vitamin D (CALCIUM 600+D) 600-400 MG-UNIT tablet Take 1 tablet by mouth daily.  . carboxymethylcellulose 1 % ophthalmic solution Apply 1 drop to eye 3 (three) times daily. (Patient taking differently: Apply 1 drop to eye 3 (three) times daily as needed (dry eyes). )  . cephALEXin (KEFLEX) 250 MG capsule Take 1 capsule (250 mg total) by mouth 3 (three) times daily.  . clopidogrel (PLAVIX) 75 MG tablet TAKE 1 TABLET (75 MG TOTAL) BY MOUTH DAILY.  . cycloSPORINE (RESTASIS) 0.05 % ophthalmic emulsion Place 1 drop into both eyes 2 (two) times daily as needed (dryness).   . diphenhydrAMINE (BENADRYL) 25 mg capsule Take 25 mg by mouth at bedtime as needed.  . Docosanol (ABREVA) 10 % CREA Apply 1 application topically 5 (five) times daily. Apply to affected area on lip  5 x day until healed  . feeding  supplement (BOOST HIGH PROTEIN) LIQD Take 1 Container by mouth See admin instructions. Drink 1 everyday and another as needed for nutrition supplement.  . furosemide (LASIX) 80 MG tablet Take 1 tablet (80 mg total) by mouth daily.  . isosorbide mononitrate (IMDUR) 30 MG 24 hr tablet Take 1 tablet (30 mg total) by mouth daily.  [name] KLOR-CON M10 10 MEQ tablet Take 1 tablet (10 mEq total) by mouth 2 (two) times daily.  [name] levothyroxine (SYNTHROID, LEVOTHROID) 112 MCG tablet Take 1 tablet (112 mcg total) by mouth daily before breakfast.  . Loratadine (CLARITIN PO) Take by mouth as needed.  . ranitidine (ZANTAC) 300 MG capsule Take 1 capsule (300 mg total) by mouth every evening.  [name] rOPINIRole (REQUIP) 0.25 MG tablet Take 2 tablets (0.5 mg total) by mouth at bedtime.  . nitroGLYCERIN (NITROSTAT) 0.4 MG SL tablet Place 1 tablet (0.4 mg total) under the tongue every 5 (five) minutes as needed for chest pain (For a total of 3 tablets, if chest pain persist to call 911). (Patient not taking: Reported on 09/29/2016)  . [DISCONTINUED] metolazone (ZAROXOLYN) 2.5 MG tablet Take 1 tablet (2.5 mg total) by mouth 3 (three) times a week. (Patient taking differently: Take 2.5 mg by mouth 3 (three) times a week. Mon / Wed / Fri)  . [DISCONTINUED] acetaminophen (TYLENOL) suppository 650 mg   . [DISCONTINUED] acetaminophen (TYLENOL) tablet 650 mg   . [DISCONTINUED] amiodarone (PACERONE) tablet 100 mg   . [DISCONTINUED] calcium-vitamin D (OSCAL WITH D) 500-200 MG-UNIT per tablet 1 tablet   . [DISCONTINUED] cefTRIAXone (ROCEPHIN) 1 g in dextrose 5 % 50 mL IVPB   . [DISCONTINUED] clopidogrel (PLAVIX) tablet 75 mg   . [DISCONTINUED] cycloSPORINE (RESTASIS) 0.05 % ophthalmic emulsion 1 drop   . [DISCONTINUED] enoxaparin (LOVENOX) injection 30 mg   . [DISCONTINUED] famotidine (PEPCID) tablet 10 mg   . [DISCONTINUED] feeding supplement (BOOST HIGH PROTEIN) liquid 237 mL   . [DISCONTINUED] furosemide (LASIX) tablet 80 mg   .  [DISCONTINUED] isosorbide mononitrate (IMDUR) 24 hr tablet 30 mg   . [DISCONTINUED] levothyroxine (SYNTHROID, LEVOTHROID) tablet 112 mcg   . [DISCONTINUED] polyvinyl alcohol (LIQUIFILM TEARS) 1.4 % ophthalmic solution 1 drop   . [DISCONTINUED] potassium chloride SA (K-DUR,KLOR-CON) CR tablet 40 mEq   . [DISCONTINUED] rOPINIRole (REQUIP) tablet 0.5 mg   . [DISCONTINUED] sodium chloride flush (NS) 0.9 % injection  3 mL    No facility-administered encounter medications on file as of 09/29/2016.     Allergies: Nubain [nalbuphine hcl]; Pineapple; Iodine; Mirtazapine; and Statins  Body mass index is 17.6 kg/m.  Blood pressure 97/65, pulse (!) 102, height 5\' 2"  (1.575 m), weight 96 lb 3.2 oz (43.6 kg).   Review of Systems  Constitutional: Positive for activity change and fatigue. Negative for appetite change, chills, diaphoresis, fever and unexpected weight change.  Eyes: Positive for visual disturbance.  Respiratory: Positive for shortness of breath. Negative for cough, chest tightness, wheezing and stridor.        ONLY minor SOB with exertion, none at rest.  Cardiovascular: Positive for leg swelling. Negative for chest pain and palpitations.  Gastrointestinal: Negative for abdominal distention, abdominal pain, blood in stool, constipation, diarrhea, nausea and vomiting.  Endocrine: Negative for cold intolerance, heat intolerance, polydipsia, polyphagia and polyuria.  Genitourinary: Negative for difficulty urinating, flank pain and hematuria.  Musculoskeletal: Positive for arthralgias, back pain, gait problem, joint swelling, myalgias, neck pain and neck stiffness.  Skin: Positive for wound. Negative for color change, pallor and rash.  Allergic/Immunologic: Negative for immunocompromised state.  Neurological: Negative for dizziness and headaches.  Hematological: Bruises/bleeds easily.  Psychiatric/Behavioral: Negative for dysphoric mood and sleep disturbance. The patient is not  nervous/anxious.        Objective:   Physical Exam  Constitutional: She is oriented to person, place, and time. She appears well-developed and well-nourished. No distress.  HENT:  Head: Normocephalic and atraumatic.  Right Ear: Decreased hearing is noted.  Left Ear: Decreased hearing is noted.  Neck: Normal range of motion. Neck supple.  Cardiovascular: Normal rate, regular rhythm, normal heart sounds and intact distal pulses.   No murmur heard. Pulmonary/Chest: Effort normal and breath sounds normal. No respiratory distress. She has no wheezes. She has no rales. She exhibits no tenderness.  Musculoskeletal: She exhibits edema.       Right ankle: She exhibits swelling.       Left ankle: She exhibits swelling.       Right lower leg: She exhibits swelling.       Left lower leg: She exhibits swelling.       Right foot: There is swelling.       Left foot: There is swelling.  Ecchymosis bil upper extremities r/t multiple lab draws during hospitalization.  Neurological: She is alert and oriented to person, place, and time.  Skin: Skin is warm and dry. No rash noted. She is not diaphoretic. No erythema. No pallor.  Psychiatric: She has a normal mood and affect. Her speech is normal and behavior is normal. Judgment and thought content normal. She exhibits abnormal recent memory.  Nursing note and vitals reviewed.         Assessment & Plan:   1. Chronic diastolic congestive heart failure (HCC)   2. Persistent atrial fibrillation ([place])   3. Itching   4. Weakness generalized   5. Restless legs   6. SOB (shortness of breath)   7. CKD (chronic kidney disease) stage 3, GFR 30-59 ml/min   8. Idiopathic hypotension   9. Coronary artery disease involving native coronary artery of native heart with other form of angina pectoris ([place])     Persistent atrial fibrillation ([place]) 06/2016 and 09/2016 EKGs- A fib with LBBB, no significant change in tracings. Findings discussed with pt and  family.   Itching Continue topical lotion and nightly Diphenhydramine 25mg  Q HS   Weakness generalized Heart Healthy  Diet and continue with home therapy.  Restless legs Continue nightly Requip  SOB (shortness of breath) No acute distress during OV and lungs clear to auscultation.  CKD (chronic kidney disease) stage 3, GFR 30-59 ml/min CMP checked today. Currently on Furosemide 80mg  daily and K+ 44mEq BID Once labs result, will adjust meds as indicated.  Hypotension BP stable today- 97/65  CAD (coronary artery disease) Has cards f/u in 10/13/16  CHF (congestive heart failure) (HCC) 1) 3 pound weight gain in 24 hours or 5 pounds in 1 week 2) shortness of breath, with or without a dry hacking cough 3) swelling in the hands, feet or stomach 4) if you have to sleep on extra pillows at night in order to breathe.     Spent >30 mins with pt and her daughter with >50% time coordinating care.   FOLLOW-UP:  Return in about 3 months (around 12/30/2016) for Regular Follow Up, CPE.

## 2016-09-29 NOTE — Assessment & Plan Note (Signed)
Continue topical lotion and nightly Diphenhydramine 25mg  Q HS

## 2016-09-29 NOTE — Assessment & Plan Note (Signed)
06/2016 and 09/2016 EKGs- A fib with LBBB, no significant change in tracings. Findings discussed with pt and family.

## 2016-09-29 NOTE — Assessment & Plan Note (Signed)
Heart Healthy Diet and continue with home therapy.

## 2016-09-29 NOTE — Patient Outreach (Signed)
Transition of care: Admit date of 09/25/2016 Discharge date of 09/27/2016   Patient was admitted for weakness and abnormal labs. Vitals:   09/29/16 1157  Weight: 96 lb 3.2 oz (43.6 kg)    Placed call to patient and spoke with daughter [name].  [name] reports that patient had a good day yesterday but does not feel as well today. Reports some shortness of breath and a 2 pound weight gain. States also having some diarrhea.   Daughter reports that patient will see primary Md today and will have labs completed.  Daughter states that home health [place] from [facility] came to see patient yesterday.   Daughter reports cellulitis is much improved with antibiotics.  Daughter [name] states that she will be going out of town and to have [employer]               (assigned Tourist information centre manager) speak with her sister Pamala Hurry who is also on consent.  PLAN: patient will be contacted weekly for transition of care. Will update Landis Martins of recent admission. Will send this note to MD and update.  Outpatient Encounter Prescriptions as of 09/29/2016  Medication Sig  . acetaminophen (TYLENOL) 500 MG tablet Take 1 tablet (500 mg total) by mouth at bedtime.  [name] amiodarone (PACERONE) 100 MG tablet Take 1 tablet (100 mg total) by mouth daily.  . Biotin 1000 MCG tablet Take 1 tablet (1 mg total) by mouth daily. (Patient taking differently: Take 5,000 mcg by mouth daily. )  . Calcium Carbonate-Vitamin D (CALCIUM 600+D) 600-400 MG-UNIT tablet Take 1 tablet by mouth daily.  . carboxymethylcellulose 1 % ophthalmic solution Apply 1 drop to eye 3 (three) times daily. (Patient taking differently: Apply 1 drop to eye 3 (three) times daily as needed (dry eyes). )  . cephALEXin (KEFLEX) 250 MG capsule Take 1 capsule (250 mg total) by mouth 3 (three) times daily.  . clopidogrel (PLAVIX) 75 MG tablet TAKE 1 TABLET (75 MG TOTAL) BY MOUTH DAILY.  . cycloSPORINE (RESTASIS) 0.05 % ophthalmic emulsion Place 1 drop into both eyes 2 (two) times  daily as needed (dryness).   . diphenhydrAMINE (BENADRYL) 25 mg capsule Take 25 mg by mouth at bedtime as needed.  . Docosanol (ABREVA) 10 % CREA Apply 1 application topically 5 (five) times daily. Apply to affected area on lip  5 x day until healed  . feeding supplement (BOOST HIGH PROTEIN) LIQD Take 1 Container by mouth See admin instructions. Drink 1 everyday and another as needed for nutrition supplement.  . furosemide (LASIX) 80 MG tablet Take 1 tablet (80 mg total) by mouth daily.  [name] KLOR-CON M10 10 MEQ tablet Take 1 tablet (10 mEq total) by mouth 2 (two) times daily.  [name] levothyroxine (SYNTHROID, LEVOTHROID) 112 MCG tablet Take 1 tablet (112 mcg total) by mouth daily before breakfast.  . ranitidine (ZANTAC) 300 MG capsule Take 1 capsule (300 mg total) by mouth every evening.  [name] rOPINIRole (REQUIP) 0.25 MG tablet Take 2 tablets (0.5 mg total) by mouth at bedtime.  . isosorbide mononitrate (IMDUR) 30 MG 24 hr tablet Take 1 tablet (30 mg total) by mouth daily.  . nitroGLYCERIN (NITROSTAT) 0.4 MG SL tablet Place 1 tablet (0.4 mg total) under the tongue every 5 (five) minutes as needed for chest pain (For a total of 3 tablets, if chest pain persist to call 911). (Patient not taking: Reported on 09/29/2016)   No facility-administered encounter medications on file as of 09/29/2016.  Tomasa Rand, RN, BSN, CEN [facility] [employer] [phone]

## 2016-09-29 NOTE — Patient Instructions (Addendum)
Heart Failure Heart failure is a condition in which the heart has trouble pumping blood because it has become weak or stiff. This means that the heart does not pump blood efficiently for the body to work well. For some people with heart failure, fluid may back up into the lungs and there may be swelling (edema) in the lower legs. Heart failure is usually a long-term (chronic) condition. It is important for you to take good care of yourself and follow the treatment plan from your health care provider. What are the causes? This condition is caused by some health problems, including:  High blood pressure (hypertension). Hypertension causes the heart muscle to work harder than normal. High blood pressure eventually causes the heart to become stiff and weak.  Coronary artery disease (CAD). CAD is the buildup of cholesterol and fat (plaques) in the arteries of the heart.  Heart attack (myocardial infarction). Injured tissue, which is caused by the heart attack, does not contract as well and the heart's ability to pump blood is weakened.  Abnormal heart valves. When the heart valves do not open and close properly, the heart muscle must pump harder to keep the blood flowing.  Heart muscle disease (cardiomyopathy or myocarditis). Heart muscle disease is damage to the heart muscle from a variety of causes, such as drug or alcohol abuse, infections, or unknown causes. These can increase the risk of heart failure.  Lung disease. When the lungs do not work properly, the heart must work harder.  What increases the risk? Risk of heart failure increases as a person ages. This condition is also more likely to develop in people who:  Are overweight.  Are female.  Smoke or chew tobacco.  Abuse alcohol or illegal drugs.  Have taken medicines that can damage the heart, such as chemotherapy drugs.  Have diabetes. ? High blood sugar (glucose) is associated with high fat (lipid) levels in the blood. ? Diabetes  can also damage tiny blood vessels that carry nutrients to the heart muscle.  Have abnormal heart rhythms.  Have thyroid problems.  Have low blood counts (anemia).  What are the signs or symptoms? Symptoms of this condition include:  Shortness of breath with activity, such as when climbing stairs.  Persistent cough.  Swelling of the feet, ankles, legs, or abdomen.  Unexplained weight gain.  Difficulty breathing when lying flat (orthopnea).  Waking from sleep because of the need to sit up and get more air.  Rapid heartbeat.  Fatigue and loss of energy.  Feeling light-headed, dizzy, or close to fainting.  Loss of appetite.  Nausea.  Increased urination during the night (nocturia).  Confusion.  How is this diagnosed? This condition is diagnosed based on:  Medical history, symptoms, and a physical exam.  Diagnostic tests, which may include: ? Echocardiogram. ? Electrocardiogram (ECG). ? Chest X-ray. ? Blood tests. ? Exercise stress test. ? Radionuclide scans. ? Cardiac catheterization and angiogram.  How is this treated? Treatment for this condition is aimed at managing the symptoms of heart failure. Medicines, behavioral changes, or other treatments may be necessary to treat heart failure. Medicines These may include:  Angiotensin-converting enzyme (ACE) inhibitors. This type of medicine blocks the effects of a blood protein called angiotensin-converting enzyme. ACE inhibitors relax (dilate) the blood vessels and help to lower blood pressure.  Angiotensin receptor blockers (ARBs). This type of medicine blocks the actions of a blood protein called angiotensin. ARBs dilate the blood vessels and help to lower blood pressure.  Water   pills (diuretics). Diuretics cause the kidneys to remove salt and water from the blood. The extra fluid is removed through urination, leaving a lower volume of blood that the heart has to pump.  Beta blockers. These improve heart  muscle strength and they prevent the heart from beating too quickly.  Digoxin. This increases the force of the heartbeat.  Healthy behavior changes These may include:  Reaching and maintaining a healthy weight.  Stopping smoking or chewing tobacco.  Eating heart-healthy foods.  Limiting or avoiding alcohol.  Stopping use of street drugs (illegal drugs).  Physical activity.  Other treatments These may include:  Surgery to open blocked coronary arteries or repair damaged heart valves.  Placement of a biventricular pacemaker to improve heart muscle function (cardiac resynchronization therapy). This device paces both the right ventricle and left ventricle.  Placement of a device to treat serious abnormal heart rhythms (implantable cardioverter defibrillator, or ICD).  Placement of a device to improve the pumping ability of the heart (left ventricular assist device, or LVAD).  Heart transplant. This can cure heart failure, and it is considered for certain patients who do not improve with other therapies.  Follow these instructions at home: Medicines  Take over-the-counter and prescription medicines only as told by your health care provider. Medicines are important in reducing the workload of your heart, slowing the progression of heart failure, and improving your symptoms. ? Do not stop taking your medicine unless your health care provider told you to do that. ? Do not skip any dose of medicine. ? Refill your prescriptions before you run out of medicine. You need your medicines every day. Eating and drinking   Eat heart-healthy foods. Talk with a dietitian to make an eating plan that is right for you. ? Choose foods that contain no trans fat and are low in saturated fat and cholesterol. Healthy choices include fresh or frozen fruits and vegetables, fish, lean meats, legumes, fat-free or low-fat dairy products, and whole-grain or high-fiber foods. ? Limit salt (sodium) if  directed by your health care provider. Sodium restriction may reduce symptoms of heart failure. Ask a dietitian to recommend heart-healthy seasonings. ? Use healthy cooking methods instead of frying. Healthy methods include roasting, grilling, broiling, baking, poaching, steaming, and stir-frying.  Limit your fluid intake if directed by your health care provider. Fluid restriction may reduce symptoms of heart failure. Lifestyle  Stop smoking or using chewing tobacco. Nicotine and tobacco can damage your heart and your blood vessels. Do not use nicotine gum or patches before talking to your health care provider.  Limit alcohol intake to no more than 1 drink per day for non-pregnant women and 2 drinks per day for men. One drink equals 12 oz of beer, 5 oz of wine, or 1 oz of hard liquor. ? Drinking more than that is harmful to your heart. Tell your health care provider if you drink alcohol several times a week. ? Talk with your health care provider about whether any level of alcohol use is safe for you. ? If your heart has already been damaged by alcohol or you have severe heart failure, drinking alcohol should be stopped completely.  Stop use of illegal drugs.  Lose weight if directed by your health care provider. Weight loss may reduce symptoms of heart failure.  Do moderate physical activity if directed by your health care provider. People who are elderly and people with severe heart failure should consult with a health care provider for physical activity recommendations.   Monitor important information  Weigh yourself every day. Keeping track of your weight daily helps you to notice excess fluid sooner. ? Weigh yourself every morning after you urinate and before you eat breakfast. ? Wear the same amount of clothing each time you weigh yourself. ? Record your daily weight. Provide your health care provider with your weight record.  Monitor and record your blood pressure as told by your health  care provider.  Check your pulse as told by your health care provider. Dealing with extreme temperatures  If the weather is extremely hot: ? Avoid vigorous physical activity. ? Use air conditioning or fans or seek a cooler location. ? Avoid caffeine and alcohol. ? Wear loose-fitting, lightweight, and light-colored clothing.  If the weather is extremely cold: ? Avoid vigorous physical activity. ? Layer your clothes. ? Wear mittens or gloves, a hat, and a scarf when you go outside. ? Avoid alcohol. General instructions  Manage other health conditions such as hypertension, diabetes, thyroid disease, or abnormal heart rhythms as told by your health care provider.  Learn to manage stress. If you need help to do this, ask your health care provider.  Plan rest periods when fatigued.  Get ongoing education and support as needed.  Participate in or seek rehabilitation as needed to maintain or improve independence and quality of life.  Stay up to date with immunizations. Keeping current on pneumococcal and influenza immunizations is especially important to prevent respiratory infections.  Keep all follow-up visits as told by your health care provider. This is important. Contact a health care provider if:  You have a rapid weight gain.  You have increasing shortness of breath that is unusual for you.  You are unable to participate in your usual physical activities.  You tire easily.  You cough more than normal, especially with physical activity.  You have any swelling or more swelling in areas such as your hands, feet, ankles, or abdomen.  You are unable to sleep because it is hard to breathe.  You feel like your heart is beating quickly (palpitations).  You become dizzy or light-headed when you stand up. Get help right away if:  You have difficulty breathing.  You notice or your family notices a change in your awareness, such as having trouble staying awake or having  difficulty with concentration.  You have pain or discomfort in your chest.  You have an episode of fainting (syncope). This information is not intended to replace advice given to you by your health care provider. Make sure you discuss any questions you have with your health care provider. Document Released: 03/01/2005 Document Revised: 11/04/2015 Document Reviewed: 09/24/2015 Elsevier Interactive Patient Education  2017 [employer].  Please continue all medications as directed and we will call when lab results are available. EKG completed 09/25/16 is unchanged from last tracing April 2018. Continue with in home therapy and call us with ANY questions/concerns. CALL IMMEDIATELY if any of the below at noticed: 1) 3 pound weight gain in 24 hours or 5 pounds in 1 week 2) shortness of breath, with or without a dry hacking cough 3) swelling in the hands, feet or stomach 4) if you have to sleep on extra pillows at night in order to breathe.   Call if temp >100.4  NICE TO SEE YOU!

## 2016-09-29 NOTE — Assessment & Plan Note (Signed)
BP stable today- 97/65

## 2016-09-29 NOTE — Assessment & Plan Note (Signed)
Has cards f/u in 10/13/16

## 2016-09-29 NOTE — Assessment & Plan Note (Signed)
Continue nightly Requip

## 2016-09-29 NOTE — Assessment & Plan Note (Signed)
1) 3 pound weight gain in 24 hours or 5 pounds in 1 week 2) shortness of breath, with or without a dry hacking cough 3) swelling in the hands, feet or stomach 4) if you have to sleep on extra pillows at night in order to breathe.

## 2016-09-29 NOTE — Assessment & Plan Note (Signed)
CMP checked today. Currently on Furosemide 80mg  daily and K+ 60mEq BID Once labs result, will adjust meds as indicated.

## 2016-09-29 NOTE — Assessment & Plan Note (Signed)
No acute distress during OV and lungs clear to auscultation.

## 2016-09-30 ENCOUNTER — Telehealth: Payer: Self-pay

## 2016-09-30 DIAGNOSIS — E875 Hyperkalemia: Secondary | ICD-10-CM

## 2016-09-30 LAB — COMPREHENSIVE METABOLIC PANEL
A/G RATIO: 1.4 (ref 1.2–2.2)
ALK PHOS: 112 IU/L (ref 39–117)
ALT: 24 IU/L (ref 0–32)
AST: 53 IU/L — AB (ref 0–40)
Albumin: 3.8 g/dL (ref 3.2–4.6)
BUN/Creatinine Ratio: 24 (ref 12–28)
BUN: 41 mg/dL — ABNORMAL HIGH (ref 10–36)
Bilirubin Total: 0.5 mg/dL (ref 0.0–1.2)
CHLORIDE: 92 mmol/L — AB (ref 96–106)
CO2: 36 mmol/L — ABNORMAL HIGH (ref 20–29)
Calcium: 9.2 mg/dL (ref 8.7–10.3)
Creatinine, Ser: 1.71 mg/dL — ABNORMAL HIGH (ref 0.57–1.00)
GFR calc Af Amer: 29 mL/min/{1.73_m2} — ABNORMAL LOW (ref >59)
GFR calc non Af Amer: 25 mL/min/{1.73_m2} — ABNORMAL LOW (ref >59)
GLOBULIN, TOTAL: 2.7 g/dL (ref 1.5–4.5)
Glucose: 66 mg/dL (ref 65–99)
POTASSIUM: 6.6 mmol/L — AB (ref 3.5–5.2)
SODIUM: 143 mmol/L (ref 134–144)
Total Protein: 6.5 g/dL (ref 6.0–8.5)

## 2016-09-30 LAB — MAGNESIUM: MAGNESIUM: 1.9 mg/dL (ref 1.6–2.3)

## 2016-09-30 NOTE — Telephone Encounter (Signed)
Patients daughter called regarding a call about lab results.  Advised family to stop potassium and come to office Monday for follow up labwork to see what potassium level is.  Patient is to continue lasix.  Complete metabolic panel order placed.

## 2016-10-01 ENCOUNTER — Telehealth: Payer: Self-pay | Admitting: Adult Health

## 2016-10-01 ENCOUNTER — Telehealth: Payer: Self-pay | Admitting: Cardiovascular Disease

## 2016-10-01 DIAGNOSIS — I110 Hypertensive heart disease with heart failure: Secondary | ICD-10-CM | POA: Diagnosis not present

## 2016-10-01 DIAGNOSIS — I4891 Unspecified atrial fibrillation: Secondary | ICD-10-CM | POA: Diagnosis not present

## 2016-10-01 DIAGNOSIS — Z950 Presence of cardiac pacemaker: Secondary | ICD-10-CM | POA: Diagnosis not present

## 2016-10-01 DIAGNOSIS — M6281 Muscle weakness (generalized): Secondary | ICD-10-CM | POA: Diagnosis not present

## 2016-10-01 DIAGNOSIS — I25119 Atherosclerotic heart disease of native coronary artery with unspecified angina pectoris: Secondary | ICD-10-CM | POA: Diagnosis not present

## 2016-10-01 DIAGNOSIS — I5032 Chronic diastolic (congestive) heart failure: Secondary | ICD-10-CM | POA: Diagnosis not present

## 2016-10-01 DIAGNOSIS — J449 Chronic obstructive pulmonary disease, unspecified: Secondary | ICD-10-CM | POA: Diagnosis not present

## 2016-10-01 DIAGNOSIS — R2681 Unsteadiness on feet: Secondary | ICD-10-CM | POA: Diagnosis not present

## 2016-10-01 NOTE — Telephone Encounter (Signed)
°  Follow Up   States she received a mess to contact the office regarding pt. Please call back.

## 2016-10-01 NOTE — Telephone Encounter (Signed)
Pt's granddaughter called states she was told to call PCP if patient gains any weight --family called to report a 4lb wt gain. --glh

## 2016-10-01 NOTE — Telephone Encounter (Signed)
Elmarie Shiley (granddaughter) denies that pt is SOB she states that she is not having any symptoms, she states that they do not take her BP at home.. She states that she has a nurse coming out to pt's house today at 1-130pm she will have them call before they leave the house. Informed due to lasix high dose I will defer this to Dr Sallyanne Kuster for further directon.

## 2016-10-01 NOTE — Telephone Encounter (Signed)
S/w daughter Torrie Mayers and notified her of Dr Lurline Del message. She verified that pt is taking lasix 80mg  daily. She will continue to take daily weights and follow instructions: If tomorrow her weight is still 4lb up or even higher, please take furosemide 80 mg TWICE a day for 2 days and add an extra KCl 20 mEq daily while doing that. Call in Rx for KCl if needed. Call back Monday with weight.  She will call back Monday with update.

## 2016-10-01 NOTE — Telephone Encounter (Signed)
Currently taking furosemide 80 mg daily per chart. Please confirm that is accurate. If tomorrow her weight is still 4lb up or even higher, please take furosemide 80 mg TWICE a day for 2 days and add an extra KCl 20 mEq daily while doing that. Call in Rx for KCl if needed. Call back Monday with weight please.

## 2016-10-01 NOTE — Telephone Encounter (Signed)
[name] is calling because Mrs. Krager has gained 4lbs from yesterday to today and she is no longer taking potassium. Please call

## 2016-10-01 NOTE — Telephone Encounter (Signed)
Called patient's home and talked with her son in law and great granddaughter - Valetta Fuller spoke with both and advised to make sure that she is not taking the potassium and that she continues with the Lasix.  Patient's Grand Daughter called back and was the one that called in this morning and gave more information.   Patient has stopped the potassium and is still taking the lasix.  Patient had a weight gain of 3-4 lbs when weighted this morning.  Per grand daughter patient has been in good spirits and eating more, denies any swelling in the legs or ankles.  Mina Marble, NP spoke with her and told her to continue care and to also call patient's cardiologist to follow up with them about the weight gain also.  She expressed understanding and will call the cardiologist.

## 2016-10-01 NOTE — Telephone Encounter (Signed)
S/w Home Health Nurse- Leafy Ro she states that pt's BP was 108/68 HR 72 she states that pt's edema is 1+ and non-pitting, she states that she did not notice any SOB or any other concerns currently. She states that her next nurse visit will be next week

## 2016-10-04 ENCOUNTER — Telehealth: Payer: Self-pay | Admitting: Adult Health

## 2016-10-04 ENCOUNTER — Other Ambulatory Visit: Payer: Medicare HMO

## 2016-10-04 ENCOUNTER — Other Ambulatory Visit: Payer: Self-pay | Admitting: Adult Health

## 2016-10-04 DIAGNOSIS — E038 Other specified hypothyroidism: Secondary | ICD-10-CM

## 2016-10-04 DIAGNOSIS — R2681 Unsteadiness on feet: Secondary | ICD-10-CM | POA: Diagnosis not present

## 2016-10-04 DIAGNOSIS — Z4800 Encounter for change or removal of nonsurgical wound dressing: Secondary | ICD-10-CM | POA: Diagnosis not present

## 2016-10-04 DIAGNOSIS — J449 Chronic obstructive pulmonary disease, unspecified: Secondary | ICD-10-CM | POA: Diagnosis not present

## 2016-10-04 DIAGNOSIS — I25119 Atherosclerotic heart disease of native coronary artery with unspecified angina pectoris: Secondary | ICD-10-CM | POA: Diagnosis not present

## 2016-10-04 DIAGNOSIS — I4891 Unspecified atrial fibrillation: Secondary | ICD-10-CM | POA: Diagnosis not present

## 2016-10-04 DIAGNOSIS — E875 Hyperkalemia: Secondary | ICD-10-CM

## 2016-10-04 DIAGNOSIS — M6281 Muscle weakness (generalized): Secondary | ICD-10-CM | POA: Diagnosis not present

## 2016-10-04 DIAGNOSIS — I5032 Chronic diastolic (congestive) heart failure: Secondary | ICD-10-CM | POA: Diagnosis not present

## 2016-10-04 DIAGNOSIS — I110 Hypertensive heart disease with heart failure: Secondary | ICD-10-CM | POA: Diagnosis not present

## 2016-10-04 DIAGNOSIS — Z950 Presence of cardiac pacemaker: Secondary | ICD-10-CM | POA: Diagnosis not present

## 2016-10-04 MED ORDER — KLOR-CON M10 10 MEQ PO TBCR: 10.0000 meq | EXTENDED_RELEASE_TABLET | Freq: Once | ORAL | 0 refills | Status: DC

## 2016-10-04 NOTE — Brief Op Note (Addendum)
Per pt, pt's daughter "Amedeo Plenty" and pt's granddaughter "Melissa"- Pt is only taking one daily Furosemide 80mg , she continues to remain off [place]. Due to error in home scale, Ms. Barasch has only gained 1 lb, not the 4 lbs as reports on 10/01/2016. Labs obtained this morning, results will be faxed/called to Dr. Sallyanne Kuster. Cards to adjust meds if indicated. Spent > 15 mins speaking with pt and pt's family reinforcing plan of care.

## 2016-10-05 ENCOUNTER — Other Ambulatory Visit: Payer: Self-pay | Admitting: *Deleted

## 2016-10-05 LAB — COMPREHENSIVE METABOLIC PANEL
ALK PHOS: 95 IU/L (ref 39–117)
ALT: 22 IU/L (ref 0–32)
AST: 48 IU/L — AB (ref 0–40)
Albumin/Globulin Ratio: 1.2 (ref 1.2–2.2)
Albumin: 3.2 g/dL (ref 3.2–4.6)
BUN/Creatinine Ratio: 21 (ref 12–28)
BUN: 36 mg/dL (ref 10–36)
Bilirubin Total: 0.6 mg/dL (ref 0.0–1.2)
CO2: 31 mmol/L — AB (ref 20–29)
CREATININE: 1.74 mg/dL — AB (ref 0.57–1.00)
Calcium: 8.7 mg/dL (ref 8.7–10.3)
Chloride: 93 mmol/L — ABNORMAL LOW (ref 96–106)
GFR calc Af Amer: 28 mL/min/{1.73_m2} — ABNORMAL LOW (ref >59)
GFR calc non Af Amer: 24 mL/min/{1.73_m2} — ABNORMAL LOW (ref >59)
GLOBULIN, TOTAL: 2.6 g/dL (ref 1.5–4.5)
GLUCOSE: 118 mg/dL — AB (ref 65–99)
Potassium: 4.5 mmol/L (ref 3.5–5.2)
SODIUM: 140 mmol/L (ref 134–144)
Total Protein: 5.8 g/dL — ABNORMAL LOW (ref 6.0–8.5)

## 2016-10-05 NOTE — Patient Outreach (Addendum)
[facility]) Care Management  10/05/2016  ONEIDA MCKAMEY 1917-03-31 [id number]   Transition of care call  Placed call and spoke with patient daughter Trecia Rogers, has requested my usual caregiver, daughter [name] that is out of town. Pamala Hurry reports patient seems to be doing good on today, she is not at patient home at this time but she has checked in this morning and other family caring for patient reports she is doing okay.  Patient able to participate in working  with puzzles, activities that she enjoys and is tolerating eating well.  Daughter reports family is aware to notify MD of weight gain greater than 2 pounds in a day and 5 in a week,but  she does know weight for today. Daughter discussed  patient recent visit to PCP office and follow up conversation with PCP regarding lasix and potassium plan, she voiced understanding.    Patient continues to be followed by home health care services of RN and PT. Daughter denies any new concerns at this time, provided my contact information if concerns arise.   Plan Will continue weekly transition of care outreaches next call in a week, and will schedule transition of care home visit with daughter, [name].    [name], RN, [facility] [phone]- Mobile [phone]- Toll Free Main Office

## 2016-10-07 DIAGNOSIS — J449 Chronic obstructive pulmonary disease, unspecified: Secondary | ICD-10-CM | POA: Diagnosis not present

## 2016-10-07 DIAGNOSIS — I110 Hypertensive heart disease with heart failure: Secondary | ICD-10-CM | POA: Diagnosis not present

## 2016-10-07 DIAGNOSIS — I4891 Unspecified atrial fibrillation: Secondary | ICD-10-CM | POA: Diagnosis not present

## 2016-10-07 DIAGNOSIS — Z950 Presence of cardiac pacemaker: Secondary | ICD-10-CM | POA: Diagnosis not present

## 2016-10-07 DIAGNOSIS — R2681 Unsteadiness on feet: Secondary | ICD-10-CM | POA: Diagnosis not present

## 2016-10-07 DIAGNOSIS — I5032 Chronic diastolic (congestive) heart failure: Secondary | ICD-10-CM | POA: Diagnosis not present

## 2016-10-07 DIAGNOSIS — I25119 Atherosclerotic heart disease of native coronary artery with unspecified angina pectoris: Secondary | ICD-10-CM | POA: Diagnosis not present

## 2016-10-07 DIAGNOSIS — M6281 Muscle weakness (generalized): Secondary | ICD-10-CM | POA: Diagnosis not present

## 2016-10-08 ENCOUNTER — Telehealth: Payer: Self-pay | Admitting: Adult Health

## 2016-10-08 NOTE — Telephone Encounter (Signed)
Pt's daughter called states pt has a sore/blister in her mouth, and wants to know if she can share some of her Rx mouthwash with pt(mom)-- I advised not to & stated would send a msg to provider/ MA to contact her regarding mouth ulcer. --glh

## 2016-10-11 ENCOUNTER — Telehealth: Payer: Self-pay

## 2016-10-11 ENCOUNTER — Other Ambulatory Visit: Payer: Self-pay | Admitting: Adult Health

## 2016-10-11 NOTE — Telephone Encounter (Signed)
Continue furosemide 80 mg daily without potassium supplement.  If she requires additional 40 mg a day of diuretic for weight gain, should also take potassium chloride 10 mEq daily at the same time. I can't remember when we next set her up for labs. She should have a basic metabolic panel at least monthly.

## 2016-10-11 NOTE — Telephone Encounter (Signed)
Patient's daughter [name] was notified.  MPulliam, CMA/RT(R)

## 2016-10-11 NOTE — Telephone Encounter (Signed)
Recommend OTC Anbesol and also to swish/spit with water/baking soda solution.

## 2016-10-11 NOTE — Telephone Encounter (Signed)
Called and spoke to her daughter, [name].  She expressed understanding.  Advised to use over the counter Anbesol and to rinse with baking soda 2x daily.  MPulliam, CMA/RT(R)

## 2016-10-11 NOTE — Telephone Encounter (Signed)
Please advise. Thank you

## 2016-10-11 NOTE — Telephone Encounter (Signed)
Potassium results came back and was forwarded to Dr Sallyanne Kuster.  Per note sent back patient is to continue current medication.  I have called and left multiple message for patient's granddaughter to contact both offices and to continue medications the same.  I am sending this to Dr Sallyanne Kuster per Mina Marble, NP to verify that patient is to continue on Lasix and to not take the potassium.  And also check to see if the family has contacted their office.   MPulliam, CMA/RT(R)

## 2016-10-12 DIAGNOSIS — M6281 Muscle weakness (generalized): Secondary | ICD-10-CM | POA: Diagnosis not present

## 2016-10-12 DIAGNOSIS — J449 Chronic obstructive pulmonary disease, unspecified: Secondary | ICD-10-CM | POA: Diagnosis not present

## 2016-10-12 DIAGNOSIS — R2681 Unsteadiness on feet: Secondary | ICD-10-CM | POA: Diagnosis not present

## 2016-10-12 DIAGNOSIS — I4891 Unspecified atrial fibrillation: Secondary | ICD-10-CM | POA: Diagnosis not present

## 2016-10-12 DIAGNOSIS — I5032 Chronic diastolic (congestive) heart failure: Secondary | ICD-10-CM | POA: Diagnosis not present

## 2016-10-12 DIAGNOSIS — I25119 Atherosclerotic heart disease of native coronary artery with unspecified angina pectoris: Secondary | ICD-10-CM | POA: Diagnosis not present

## 2016-10-12 DIAGNOSIS — I110 Hypertensive heart disease with heart failure: Secondary | ICD-10-CM | POA: Diagnosis not present

## 2016-10-12 DIAGNOSIS — Z950 Presence of cardiac pacemaker: Secondary | ICD-10-CM | POA: Diagnosis not present

## 2016-10-13 ENCOUNTER — Ambulatory Visit: Payer: Self-pay | Admitting: *Deleted

## 2016-10-13 ENCOUNTER — Ambulatory Visit (INDEPENDENT_AMBULATORY_CARE_PROVIDER_SITE_OTHER): Payer: Medicare HMO | Admitting: Physician Assistant

## 2016-10-13 VITALS — BP 102/64 | HR 81 | Ht 63.0 in | Wt 107.0 lb

## 2016-10-13 DIAGNOSIS — I481 Persistent atrial fibrillation: Secondary | ICD-10-CM | POA: Diagnosis not present

## 2016-10-13 DIAGNOSIS — I5043 Acute on chronic combined systolic (congestive) and diastolic (congestive) heart failure: Secondary | ICD-10-CM

## 2016-10-13 DIAGNOSIS — I110 Hypertensive heart disease with heart failure: Secondary | ICD-10-CM | POA: Diagnosis not present

## 2016-10-13 DIAGNOSIS — I4819 Other persistent atrial fibrillation: Secondary | ICD-10-CM

## 2016-10-13 DIAGNOSIS — Z950 Presence of cardiac pacemaker: Secondary | ICD-10-CM | POA: Diagnosis not present

## 2016-10-13 DIAGNOSIS — R2681 Unsteadiness on feet: Secondary | ICD-10-CM | POA: Diagnosis not present

## 2016-10-13 DIAGNOSIS — L89520 Pressure ulcer of left ankle, unstageable: Secondary | ICD-10-CM | POA: Diagnosis not present

## 2016-10-13 DIAGNOSIS — J449 Chronic obstructive pulmonary disease, unspecified: Secondary | ICD-10-CM | POA: Diagnosis not present

## 2016-10-13 DIAGNOSIS — I5032 Chronic diastolic (congestive) heart failure: Secondary | ICD-10-CM | POA: Diagnosis not present

## 2016-10-13 DIAGNOSIS — I4891 Unspecified atrial fibrillation: Secondary | ICD-10-CM | POA: Diagnosis not present

## 2016-10-13 DIAGNOSIS — M6281 Muscle weakness (generalized): Secondary | ICD-10-CM | POA: Diagnosis not present

## 2016-10-13 DIAGNOSIS — I25119 Atherosclerotic heart disease of native coronary artery with unspecified angina pectoris: Secondary | ICD-10-CM | POA: Diagnosis not present

## 2016-10-13 MED ORDER — FUROSEMIDE 80 MG PO TABS: ORAL_TABLET | ORAL | 3 refills | Status: AC

## 2016-10-13 NOTE — Patient Instructions (Signed)
Medication Instructions:  Your physician has recommended you make the following change in your medication:  1.  INCREASE the Lasix to 1 extra tablet on TUESDAYS AND FRIDAYS, STARTING NEXT WEEK, BUT THIS WEEK SHE WILL TAKE AN EXTRA ONE TODAY AND Friday.   Labwork: 1 WEEK:  BMET AT YOUR PCP'S OFFICE  Testing/Procedures: None ordered  Follow-Up: Your physician recommends that you schedule a follow-up appointment in: 11/04/16 WITH DR. Sallyanne Kuster   Any Other Special Instructions Will Be Listed Below (If Applicable). 1.  GET YOUR LABS DONE AT YOUR PRIMARY CARE DR'S OFFICE IN 1 WEEKS 2.  WATCH THE SODIUM INTAKE WHEN YOU EAT OUT 3.  GET OUT OF THE HOUSE AT LEAST 2 X'S A WEEK AND DO SOMETHING 4.  ASK  YOUR PCP ABOUT THE ITCHING, LET THEM KNOW THE BENADRYL ISN'T WORKING, AND ASK THEM ABOUT THE SORE ON YOUR LIP 5.  JUST FYI:  [facility] S[employer] HAS 950 GRAMS OF SODIUM AND THE ROLL HAS 535, WHICH IS 3/4 OF SODIUM THAT SHE IS SUPPOSED TO HAVE IN 1 DAY.    If you need a refill on your cardiac medications before your next appointment, please call your pharmacy.

## 2016-10-13 NOTE — Progress Notes (Signed)
Cardiology Office Note   Date:  10/13/2016   ID:  [name], DOB 04/02/1917, MRN [id number]  PCP:  Esaw Grandchild, NP  Cardiologist:  Dr. Sallyanne [name] 08/18/2016 Brandy Ferries, PA-C   History of Present Illness: [name] is a 83 y.o. female with a history of D-CHF, COPD, GERD, LAD stent 1997, CFX stent 1997, RCA stent 1997, RCA, LAD & CFX stents 2006, MV 2012 low risk, HTN, HLD, SSS w/ MDT PPM at [facility], persistent afib on amio, TIA, hypothyroid, CHADS2VASC=7 (age x 2, TIA x 2, CAD, HTN, CHF), anticoagulated with Plavix only due age and fall risk, R-CIA stent, 3.8 cm AAA 2015 [place], intol statins  Hospitalized 07/14-07/16 with CHF, LE cellulitis Phone notes regarding volume overloaded increased Lasix dosing with potassium noted, appointment made  [name] presents for cardiology follow up. Her daughter is with her today.  She and her daughter are concerned about the up/down K+, her renal function and her volume status.   She has some LE edema, worse during the day. Her weight has been gradually trending up, total gain 10 lbs since d/c from the hospital. She is compliant with medications. Current Lasix dose is 80 mg qd. She is off the metolazone because of a Cr of 1.9 and K+ 2.4. Her BUN/Cr 36/1.74.   Her breathing is worse, she denies orthopnea or PND, but cannot sleep at night (despite 50 mg Benadry) because she is itching. The itching is all over.   They wonder if she should be on the metolazone. They worry about her volume status and are aware that her kidney function is poor.  Palliative care is involved, but they are not ready to pursue comfort care. However, they are frustrated by the competing issues of poor renal function and ongoing edema.  She does well at home because her daughter does not cook with sodium and tries to use things that are not intrinsically high in sodium. However, when she eats out, she eats things that are high in sodium such as a country ham biscuit  from [place] (over 2000 mg) or [place] food.  She is too weak and short of breath to go anywhere except in the wheelchair. However, she doesn't want to go in a wheelchair because she is unhappy with someone having to help her with the wheelchair and push her around in it.  Initially, after the patient came home from the hospital, her legs were smaller and the erythema had improved. However, recently the erythema has become worse and the edema has become worse as well. She has not been having any fevers or chills. The lesion on her lip has been there for a year.   Past Medical History:  Diagnosis Date  . Abdominal aortic aneurysm ([place])   . Anemia    hx  . Asthma    hx  . Atrial fibrillation ([place])   . Blood transfusion   . Blood transfusion without reported diagnosis   . CAD (coronary artery disease)   . CHF (congestive heart failure) ([place])   . COPD (chronic obstructive pulmonary disease) ([place])   . GERD (gastroesophageal reflux disease)   . GI bleed 2007  . H/O hiatal hernia   . Heart attack ([place])   . Heart disease   . History of right hip replacement 2013  . Hyperlipidemia   . Hypertension   . Hypothyroidism   . Myocardial infarction ([place])   . Osteoporosis   . Pacemaker   .  Renal failure, chronic   . SSS (sick sinus syndrome) ([place]) 10/05/2007   Medtronic Adapta  . Stroke ([place])   . TIA (transient ischemic attack)   . UTI (lower urinary tract infection)    hx    Past Surgical History:  Procedure Laterality Date  . CARDIAC CATHETERIZATION    . COLONOSCOPY    . CORONARY STENT PLACEMENT     x9  . FRACTURE SURGERY     femur fx, /w ORIF into HIP- 2008  . JOINT REPLACEMENT    . NM MYOCAR PERF WALL MOTION  10/08/2010   mild apical anterior ischemia  . PACEMAKER INSERTION  10/05/2007   Medtronic Adapta  . TOTAL HIP ARTHROPLASTY     planned for 08/27/2011 Left  . TOTAL HIP ARTHROPLASTY  08/27/2011   Procedure: TOTAL HIP ARTHROPLASTY;  Surgeon: Kerin Salen, MD;  Location:  [facility];  Service: Orthopedics;  Laterality: Right;  . US ECHOCARDIOGRAPHY  08/23/2011   EF 50%,LA mod. dilated,mild to mod. mitral annular ca+,mod. MR,mild to mod TR,mild to mod. PH,trace AI.    Current Outpatient Prescriptions  Medication Sig Dispense Refill  . acetaminophen (TYLENOL) 500 MG tablet Take 1 tablet (500 mg total) by mouth at bedtime. 30 tablet 2  . amiodarone (PACERONE) 100 MG tablet Take 1 tablet (100 mg total) by mouth daily. 90 tablet 3  . Biotin 1000 MCG tablet Take 1 tablet (1 mg total) by mouth daily. (Patient taking differently: Take 5,000 mcg by mouth daily. ) 90 tablet 2  . Calcium Carbonate-Vitamin D (CALCIUM 600+D) 600-400 MG-UNIT tablet Take 1 tablet by mouth daily. 90 tablet 2  . carboxymethylcellulose 1 % ophthalmic solution Apply 1 drop to eye 3 (three) times daily. (Patient taking differently: Apply 1 drop to eye 3 (three) times daily as needed (dry eyes). ) 30 mL 2  . cephALEXin (KEFLEX) 250 MG capsule Take 1 capsule (250 mg total) by mouth 3 (three) times daily. 18 capsule 0  . clopidogrel (PLAVIX) 75 MG tablet TAKE 1 TABLET (75 MG TOTAL) BY MOUTH DAILY. 90 tablet 1  . cycloSPORINE (RESTASIS) 0.05 % ophthalmic emulsion Place 1 drop into both eyes 2 (two) times daily as needed (dryness).     . diphenhydrAMINE (BENADRYL) 25 mg capsule Take 25 mg by mouth at bedtime as needed.    . Docosanol (ABREVA) 10 % CREA Apply 1 application topically 5 (five) times daily. Apply to affected area on lip  5 x day until healed 1 Tube 2  . feeding supplement (BOOST HIGH PROTEIN) LIQD Take 1 Container by mouth See admin instructions. Drink 1 everyday and another as needed for nutrition supplement.    . furosemide (LASIX) 80 MG tablet Take 1 tablet (80 mg total) by mouth daily.    [name] levothyroxine (SYNTHROID, LEVOTHROID) 112 MCG tablet Take 1 tablet (112 mcg total) by mouth daily before breakfast. 90 tablet 0  . Loratadine (CLARITIN PO) Take by mouth as needed.    . nitroGLYCERIN  (NITROSTAT) 0.4 MG SL tablet Place 1 tablet (0.4 mg total) under the tongue every 5 (five) minutes as needed for chest pain (For a total of 3 tablets, if chest pain persist to call 911). 50 tablet 3  . ranitidine (ZANTAC) 300 MG capsule Take 1 capsule (300 mg total) by mouth every evening. 90 capsule 1  . rOPINIRole (REQUIP) 0.25 MG tablet Take 2 tablets (0.5 mg total) by mouth at bedtime. 180 tablet 0  . isosorbide mononitrate (IMDUR) 30 MG  24 hr tablet Take 1 tablet (30 mg total) by mouth daily. 90 tablet 3   No current facility-administered medications for this visit.     Allergies:   Nubain [nalbuphine hcl]; Pineapple; Iodine; Mirtazapine; and Statins    Social History:  The patient  reports that she has quit smoking. Her smoking use included Cigarettes. She has never used smokeless tobacco. She reports that she does not drink alcohol or use drugs.   Family History:  The patient's family history includes Atrial fibrillation in her daughter and daughter; Cancer in her brother, brother, sister, and sister; Diabetes in her daughter; Emphysema in her father; Heart Problems in her son; Heart disease in her mother; Stroke in her daughter.    ROS:  Please see the history of present illness. All other systems are reviewed and negative.    PHYSICAL EXAM: VS:  BP 102/64   Pulse 81   Ht 5\' 3"  (1.6 m)   Wt 107 lb (48.5 kg)   BMI 18.95 kg/m  , BMI Body mass index is 18.95 kg/m. GEN: Well nourished, well developed, female in no acute distress at rest.  HEENT: normal for age, small lesion on her lip with the minimal amount of edema in the area is noted.  Neck: JVD 10 cm, no carotid bruit, no masses Cardiac: Irreg R&R; soft murmur, no rubs, or gallops Respiratory:  Bibasilar rales, normal work of breathing GI: soft, nontender, nondistended, + BS MS: no deformity or atrophy; 1+ edema, also with erythema; distal pulses are 2+ in all 4 extremities   Skin: warm and dry, no rash; healing lesions are  present on both lower extremities Neuro:  Strength and sensation are intact Psych: euthymic mood, full affect   EKG:  EKG is not ordered today.  ECHO: 09/27/2016 - Left ventricle: False tendon in the mid LV cavity of no clinical   significance. The cavity size was normal. Systolic function was   severely reduced. The estimated ejection fraction was in the   range of 25% to 30%. There is akinesis of the entireanterior   myocardium. There is akinesis of the midanteroseptal myocardium.   There is akinesis of the basal-mid inferosepta and apical septall   myocardium. There is akinesis of the apicalinferior myocardium.   There is akinesis of the apical myocardium. - Aortic valve: Trileaflet; mildly thickened, mildly calcified   leaflets. - Mitral valve: Calcified annulus. There was moderate   regurgitation. Effective regurgitant orifice (PISA): 0.13 cm^2.   Regurgitant volume (PISA): 20 ml. - Left atrium: The atrium was severely dilated. - Right atrium: The atrium was moderately dilated. - Tricuspid valve: There was moderate regurgitation. - Pulmonary arteries: PA peak pressure: 44 mm Hg (S).  Impressions:  - The right ventricular systolic pressure was increased consistent   with moderate pulmonary hypertension.  Recent Labs: 06/13/2016: B Natriuretic Peptide 903.0 09/25/2016: Hemoglobin 13.1; Platelets 99 09/26/2016: TSH 1.018 09/29/2016: Magnesium 1.9 10/04/2016: ALT 22; BUN 36; Creatinine, Ser 1.74; Potassium 4.5; Sodium 140    Lipid Panel    Component Value Date/Time   CHOL 157 04/26/2016 1001   TRIG 109 04/26/2016 1001   HDL 48 04/26/2016 1001   CHOLHDL 3.3 04/26/2016 1001   CHOLHDL 3.3 12/01/2012 1035   VLDL 24 12/01/2012 1035   LDLCALC 87 04/26/2016 1001     Wt Readings from Last 3 Encounters:  10/13/16 107 lb (48.5 kg)  09/29/16 96 lb 3.2 oz (43.6 kg)  09/29/16 96 lb 3.2 oz (43.6 kg)  Other studies Reviewed: Additional studies/ records that were reviewed  today include: Office notes, hospital records and testing.  ASSESSMENT AND PLAN:  1.  Persistent atrial fibrillation: Her rate is controlled on low-dose amiodarone. We are not able to add a beta blocker because of baseline low blood pressure. Her pacemaker was at [facility] when last checked, but since she went into atrial fibrillation, she did not need pacing very often. Her CHADS2VASC is elevated but she is not anticoagulated due to age and fall risk. Continue Plavix.  2. Acute on chronic systolic CHF: Her weight is up 11 pounds in the last 2 weeks. The patient and her daughter understand that we have to balance the diuresis with good poor renal function. We will increase her Lasix from 80 mg daily to 80 mg twice a day twice a week. Make sure to take 80 mg twice a day 2 times this week.   She is to get a BMET next week at her PCPs office which is just a few minutes from her house. She is to keep her follow-up appointment in August with Dr. Sallyanne [name].   The patient and her daughter were educated on the amounts of sodium in some of the foods that she routinely eats when she goes out. She does not go out very often. She is encouraged to do the best she can with decreased sodium options when she goes out. Continue daily weights.  The patient and her daughter understand why she should not go back on the metolazone.  3. General medical:  a) She is encouraged to ask her PCP about the itching every night that is not controlled by 50 mg of Benadryl at bedtime.  b) She is also to ask them about the sore on her lip. She was putting cream on it but it did not help. c) She is to keep an eye on her legs and if the erythema and swelling becomes any worse she is to call because she may need to be restarted on antibiotics.   Current medicines are reviewed at length with the patient today.  The patient has concerns regarding medicines. Concerns were addressed  The following changes have been made:  Increase  Lasix  Labs/ tests ordered today include:   Orders Placed This Encounter  Procedures  . Basic metabolic panel     Disposition:   FU with Dr. Sallyanne [name]  Signed, Brandy Ferries, PA-C  10/13/2016 2:21 PM    [place] Phone: [phone]; Fax: [phone]  This note was written with the assistance of speech recognition software. Please excuse any transcriptional errors.

## 2016-10-14 ENCOUNTER — Other Ambulatory Visit: Payer: Self-pay | Admitting: *Deleted

## 2016-10-14 NOTE — Patient Outreach (Signed)
[facility]) Care Management  10/14/2016  [name] Dec 02, 1917 [id number]  Transition of care call  Spoke with daughter [name] on patient behalf. She discussed recent visit to cardiology office and adjustments in lasix. She discussed patient's legs look better on today , compared to increased redness that had developed.  Daughter reports patient is eating well and discussed salt limitations. Today's weight is 105.4, a decrease in weight from yesterday.  Home health RN is still visiting patient, daughter believes physical therapy course is completed.   Patient to has PCP follow visit on next week for labs and to evaluate irritation on lip states she has had it more than a year.   Plan Will follow up with patient with next week with transition of care home visit.   [name], RN, [place] Management Coordinator  [phone]- Mobile [phone]- Toll Free Main Office

## 2016-10-18 DIAGNOSIS — I4891 Unspecified atrial fibrillation: Secondary | ICD-10-CM | POA: Diagnosis not present

## 2016-10-18 DIAGNOSIS — L89520 Pressure ulcer of left ankle, unstageable: Secondary | ICD-10-CM | POA: Diagnosis not present

## 2016-10-18 DIAGNOSIS — Z950 Presence of cardiac pacemaker: Secondary | ICD-10-CM | POA: Diagnosis not present

## 2016-10-18 DIAGNOSIS — M6281 Muscle weakness (generalized): Secondary | ICD-10-CM | POA: Diagnosis not present

## 2016-10-18 DIAGNOSIS — R2681 Unsteadiness on feet: Secondary | ICD-10-CM | POA: Diagnosis not present

## 2016-10-18 DIAGNOSIS — I110 Hypertensive heart disease with heart failure: Secondary | ICD-10-CM | POA: Diagnosis not present

## 2016-10-18 DIAGNOSIS — I25119 Atherosclerotic heart disease of native coronary artery with unspecified angina pectoris: Secondary | ICD-10-CM | POA: Diagnosis not present

## 2016-10-18 DIAGNOSIS — J449 Chronic obstructive pulmonary disease, unspecified: Secondary | ICD-10-CM | POA: Diagnosis not present

## 2016-10-18 DIAGNOSIS — I5032 Chronic diastolic (congestive) heart failure: Secondary | ICD-10-CM | POA: Diagnosis not present

## 2016-10-19 ENCOUNTER — Encounter: Payer: Self-pay | Admitting: *Deleted

## 2016-10-19 ENCOUNTER — Other Ambulatory Visit: Payer: Self-pay | Admitting: *Deleted

## 2016-10-19 NOTE — Patient Outreach (Signed)
[facility]) Care Management   8/7/2018109.2  [name] 27-Aug-1917 [id number]  [name] is an 83 y.o. female  Subjective:  Patient reports feeling much better today compared to yesterday.  Patient complaint of not sleeping well at night, due to itching. Reports up 4 times a night getting from bed to recliner chair due to itching and not resting. Reports itching still with taking benadryl at night or not.  Patient complaint of sore on lip. Patient daughter reports weight up 2 pounds in the last 4 days at 109 .2 on today. She discussed patient current lasix schedule of taking extra 80 mg on Tuesday and Thursday. Daughter discussed patient eats some food choices with more salt , but she only eat a few bites not the whole serving, and she wants her to have some of the foods she likes to eat at least once a month.  Patient reports being short winded with walking , comfortable with resting in chair. Patient reports tolerating her usual ADL's.    Objective:  BP 108/72 (BP Location: Left Arm, Patient Position: Sitting, Cuff Size: Normal)   Pulse 82   Resp 20   Wt 109 lb 3.2 oz (49.5 kg)   SpO2 93%   BMI 19.34 kg/m   On arrival patient noted sitting in recliner chair about to have lunch that included a dill pickle, report she only eat seeded part due to not being able to chew.  Review of Systems  Constitutional: Negative.   HENT: Positive for hearing loss.        Hard of hearing  Eyes: Negative.   Respiratory: Negative for cough and wheezing.   Cardiovascular: Positive for leg swelling.  Gastrointestinal: Negative.   Genitourinary: Negative.   Musculoskeletal: Negative.   Skin: Negative.   Neurological: Negative for dizziness.  Psychiatric/Behavioral: Negative.     Physical Exam  Constitutional: She is oriented to person, place, and time. She appears well-developed and well-nourished.  Cardiovascular: Normal rate.   Respiratory: Effort normal. She has no  wheezes.  Decreased breath sounds at bases, few crackles left base.   GI: Soft.  Neurological: She is alert and oriented to person, place, and time.  Skin: Skin is warm and dry.     Lower legs redden, and tight    Encounter Medications:   Outpatient Encounter Prescriptions as of 10/19/2016  Medication Sig  . acetaminophen (TYLENOL) 500 MG tablet Take 1 tablet (500 mg total) by mouth at bedtime.  [name] amiodarone (PACERONE) 100 MG tablet Take 1 tablet (100 mg total) by mouth daily.  . Biotin 1000 MCG tablet Take 1 tablet (1 mg total) by mouth daily. (Patient taking differently: Take 5,000 mcg by mouth daily. )  . Calcium Carbonate-Vitamin D (CALCIUM 600+D) 600-400 MG-UNIT tablet Take 1 tablet by mouth daily.  . carboxymethylcellulose 1 % ophthalmic solution Apply 1 drop to eye 3 (three) times daily. (Patient taking differently: Apply 1 drop to eye 3 (three) times daily as needed (dry eyes). )  . clopidogrel (PLAVIX) 75 MG tablet TAKE 1 TABLET (75 MG TOTAL) BY MOUTH DAILY.  . cycloSPORINE (RESTASIS) 0.05 % ophthalmic emulsion Place 1 drop into both eyes 2 (two) times daily as needed (dryness).   . diphenhydrAMINE (BENADRYL) 25 mg capsule Take 25 mg by mouth at bedtime as needed.  . Docosanol (ABREVA) 10 % CREA Apply 1 application topically 5 (five) times daily. Apply to affected area on lip  5 x day until healed  .  feeding supplement (BOOST HIGH PROTEIN) LIQD Take 1 Container by mouth See admin instructions. Drink 1 everyday and another as needed for nutrition supplement.  . furosemide (LASIX) 80 MG tablet TAKE 1 TABLET BY MOUTH DAILY EXCEPT FOR TUESDAYS AND FRIDAYS, YOU TAKE 1 TABLET BY MOUTH TWICE DAY  . levothyroxine (SYNTHROID, LEVOTHROID) 112 MCG tablet Take 1 tablet (112 mcg total) by mouth daily before breakfast.  . Loratadine (CLARITIN PO) Take by mouth as needed.  . nitroGLYCERIN (NITROSTAT) 0.4 MG SL tablet Place 1 tablet (0.4 mg total) under the tongue every 5 (five) minutes as needed  for chest pain (For a total of 3 tablets, if chest pain persist to call 911).  . ranitidine (ZANTAC) 300 MG capsule Take 1 capsule (300 mg total) by mouth every evening.  [name] rOPINIRole (REQUIP) 0.25 MG tablet Take 2 tablets (0.5 mg total) by mouth at bedtime.  . cephALEXin (KEFLEX) 250 MG capsule Take 1 capsule (250 mg total) by mouth 3 (three) times daily. (Patient not taking: Reported on 10/19/2016)  . isosorbide mononitrate (IMDUR) 30 MG 24 hr tablet Take 1 tablet (30 mg total) by mouth daily.   No facility-administered encounter medications on file as of 10/19/2016.   Patient was recently discharged from hospital and all medications have been reviewed.  Functional Status:   In your present state of health, do you have any difficulty performing the following activities: 10/19/2016 09/25/2016  Hearing? Tempie Donning  Vision? N N  Difficulty concentrating or making decisions? N N  Walking or climbing stairs? Y N  Dressing or bathing? N N  Comment does wash ups -  Doing errands, shopping? Tempie Donning  Comment family helps -  Conservation officer, nature and eating ? N -  Using the Toilet? N -  In the past six months, have you accidently leaked urine? Y -  Comment wears depends  -  Do you have problems with loss of bowel control? N -  Managing your Medications? Y -  Comment daughter helps -  Managing your Finances? Y -  Comment family helps -  Housekeeping or managing your Housekeeping? Y -  Comment lives with daughter  -  Some recent data might be hidden    Fall/Depression Screening:    Fall Risk  09/08/2016 07/05/2016 04/26/2016  Falls in the past year? Yes No No  Number falls in past yr: 1 - -  Injury with Fall? No - -  Risk for fall due to : History of fall(s);Impaired balance/gait Impaired balance/gait;Impaired mobility -  Risk for fall due to: Comment - high fall risk -  Follow up Falls prevention discussed;Falls evaluation completed - -   PHQ 2/9 Scores 10/19/2016 07/05/2016 04/26/2016 12/09/2014 09/12/2014 08/16/2014  08/01/2014  PHQ - 2 Score 1 1 0 0 '2 1 1  '$ PHQ- 9 Score - - - - 4 - -    Assessment:    1.Transition of care home visit. [facility].   2.Itching- no relief with benadryl, no rash noted. Daughter to discuss with PCP.   3.Congestive heart failure -  Continuing to weigh daily,Continued edema lower legs. Reviewed low salt food options and daily limitations of less than 2 grams.   4..Wounds -  Right and left outer ankle and right leg, with reported skin tear areas. Foam dressing in place, care by home health. Concerned about sore on lip, to discuss with PCP.  5.Palliative care- need follow up on next patient visit.  6. High Fall risk- using walker.  Plan:  1.Will continue weekly outreach as part of transition of care program. 2. Primary care provider  follow up on 8/8 3. Provided and reviewed EMMI handout on low salt diet  .4.Encouraged keeping legs elevated off and on during the day.  5.. Placed call to Palliative care of [facility], spoke with Inez Catalina, daughter able to schedule  home visit set up for 8/16 with Merrily Pew, NP 6. Reviewed fall safety measures.   THN CM Care Plan Problem One     Most Recent Value  Care Plan Problem One  Recent hosptial admission related to heart failure  Role Documenting the Problem One  Care Management [place] for Problem One  Active  THN Long Term Goal   Patient will not experience a hospital admission in the next 31 days   THN Long Term Goal Start Date  09/29/16  Interventions for Problem One Long Term Goal  Discussed importance of PCP of attending PCP visit, taking weight chart, write list of questions/concern for visit   THN CM Short Term Goal #1   Patient will report continuing weighing daily and keeping a record in the next 30 days   THN CM Short Term Goal #1 Start Date  10/05/16 Barrie Folk restart ]  THN CM Short Term Goal #1 Met Date  10/19/16  [facility] CM Short Term Goal #2   Patient/caregiver will be able to  continue to  state worsening symptoms of heart failure and action plan in the next 30   THN CM Short Term Goal #2 Start Date  10/05/16 Billy Fischer of goal ]  Interventions for Short Term Goal #2  with teachback reviewed zone symptoms to notify MD of   [facility] CM Short Term Goal #3  Patient will report follow up contact with palliative care in the next 30 days   THN CM Short Term Goal #3 Start Date  09/23/16  Interventions for Short Tern Goal #3  placed call to palliative care and home visit set up   [place] Short Term Goal #4  Patient will report decreased shortness of breath in 1 week  THN CM Short Term Goal #4 Start Date  09/29/16  [facility] CM Short Term Goal #4 Met Date  10/05/16  [facility] CM Short Term Goal #5   Patient to report increase knowledge of low salt food options on 2 gm sodium diet in the next 30 days   THN CM Short Term Goal #5 Start Date  10/19/16  Interventions for Short Term Goal #5  Provided and reviewed EMMI handout on low sodium diet, food choices , diet limit of salt      [name], RN, [place] Management Coordinator  [phone]- Mobile [phone]- [place]

## 2016-10-20 ENCOUNTER — Encounter: Payer: Self-pay | Admitting: Adult Health

## 2016-10-20 ENCOUNTER — Ambulatory Visit (INDEPENDENT_AMBULATORY_CARE_PROVIDER_SITE_OTHER): Payer: Medicare HMO | Admitting: Adult Health

## 2016-10-20 VITALS — BP 98/64 | HR 96 | Ht 63.0 in | Wt 112.8 lb

## 2016-10-20 DIAGNOSIS — N183 Chronic kidney disease, stage 3 unspecified: Secondary | ICD-10-CM

## 2016-10-20 DIAGNOSIS — M79605 Pain in left leg: Secondary | ICD-10-CM | POA: Diagnosis not present

## 2016-10-20 DIAGNOSIS — L299 Pruritus, unspecified: Secondary | ICD-10-CM

## 2016-10-20 DIAGNOSIS — I5032 Chronic diastolic (congestive) heart failure: Secondary | ICD-10-CM | POA: Diagnosis not present

## 2016-10-20 DIAGNOSIS — K1370 Unspecified lesions of oral mucosa: Secondary | ICD-10-CM | POA: Diagnosis not present

## 2016-10-20 DIAGNOSIS — I5043 Acute on chronic combined systolic (congestive) and diastolic (congestive) heart failure: Secondary | ICD-10-CM | POA: Diagnosis not present

## 2016-10-20 MED ORDER — HYDROXYZINE HCL 10 MG PO TABS: 5.0000 mg | ORAL_TABLET | ORAL | 1 refills | Status: AC

## 2016-10-20 NOTE — Assessment & Plan Note (Signed)
Recommend OTC Cepacol mouthwash per manufacturer's instructions. Dermatology referral placed.

## 2016-10-20 NOTE — Assessment & Plan Note (Signed)
Referral placed to Nephrology.

## 2016-10-20 NOTE — Patient Instructions (Addendum)
Canker Sores Canker sores are small, painful sores that develop inside your mouth. They may also be called aphthous ulcers. You can get canker sores on the inside of your lips or cheeks, on your tongue, or anywhere inside your mouth. You can have just one canker sore or several of them. Canker sores cannot be passed from one person to another (noncontagious). These sores are different than the sores that you may get on the outside of your lips (cold sores or fever blisters). Canker sores usually start as painful red bumps. Then they turn into small white, yellow, or gray ulcers that have red borders. The ulcers may be quite painful. The pain may be worse when you eat or drink. What are the causes? The cause of this condition is not known. What increases the risk? This condition is more likely to develop in:  Women.  People in their teens or 65s.  Women who are having their menstrual period.  People who are under a lot of emotional stress.  People who do not get enough iron or B vitamins.  People who have poor oral hygiene.  People who have an injury inside the mouth. This can happen after having dental work or from chewing something hard.  What are the signs or symptoms? Along with the canker sore, symptoms may also include:  Fever.  Fatigue.  Swollen lymph nodes in your neck.  How is this diagnosed? This condition can be diagnosed based on your symptoms. Your health care provider will also examine your mouth. Your health care provider may also do tests if you get canker sores often or if they are very bad. Tests may include:  Blood tests to rule out other causes of canker sores.  Taking swabs from the sore to check for infection.  Taking a small piece of skin from the sore (biopsy) to test it for cancer.  How is this treated? Most canker sores clear up without treatment in about 10 days. Home care is usually the only treatment that you will need. Over-the-counter medicines  can relieve discomfort.If you have severe canker sores, your health care provider may prescribe:  Numbing ointment to relieve pain.  Vitamins.  Steroid medicines. These may be given as: ? Oral pills. ? Mouth rinses. ? Gels.  Antibiotic mouth rinse.  Follow these instructions at home:  Apply, take, or use medicines only as directed by your health care provider. These include vitamins.  If you were prescribed an antibiotic mouth rinse, finish all of it even if you start to feel better.  Until the sores are healed: ? Do not drink coffee or citrus juices. ? Do not eat spicy or salty foods.  Use a mild, over-the-counter mouth rinse as directed by your health care provider.  Practice good oral hygiene. ? Floss your teeth every day. ? Brush your teeth with a soft brush twice each day. Contact a health care provider if:  Your symptoms do not get better after two weeks.  You also have a fever or swollen glands.  You get canker sores often.  You have a canker sore that is getting larger.  You cannot eat or drink due to your canker sores. This information is not intended to replace advice given to you by your health care provider. Make sure you discuss any questions you have with your health care provider. Document Released: 06/26/2010 Document Revised: 08/07/2015 Document Reviewed: 01/30/2014 Elsevier Interactive Patient Education  2018 [employer].  Pruritus Pruritus is an itching  feeling. There are many different conditions and factors that can make your skin itchy. Dry skin is one of the most common causes of itching. Most cases of itching do not require medical attention. Itchy skin can turn into a rash. Follow these instructions at home: Watch your pruritus for any changes. Take these steps to help with your condition: Skin Care  Moisturize your skin as needed. A moisturizer that contains petroleum jelly is best for keeping moisture in your skin.  Take or apply  medicines only as directed by your health care provider. This may include: ? Corticosteroid cream. ? Anti-itch lotions. ? Oral anti-histamines.  Apply cool compresses to the affected areas.  Try taking a bath with: ? Epsom salts. Follow the instructions on the packaging. You can get these at your local pharmacy or grocery store. ? Baking soda. Pour a small amount into the bath as directed by your health care provider. ? Colloidal oatmeal. Follow the instructions on the packaging. You can get this at your local pharmacy or grocery store.  Try applying baking soda paste to your skin. Stir water into baking soda until it reaches a paste-like consistency.  Do not scratch your skin.  Avoid hot showers or baths, which can make itching worse. A cold shower may help with itching as long as you use a moisturizer after.  Avoid scented soaps, detergents, and perfumes. Use gentle soaps, detergents, perfumes, and other cosmetic products. General instructions  Avoid wearing tight clothes.  Keep a journal to help track what causes your itch. Write down: ? What you eat. ? What cosmetic products you use. ? What you drink. ? What you wear. This includes jewelry.  Use a humidifier. This keeps the air moist, which helps to prevent dry skin. Contact a health care provider if:  The itching does not go away after several days.  You sweat at night.  You have weight loss.  You are unusually thirsty.  You urinate more than normal.  You are more tired than normal.  You have abdominal pain.  Your skin tingles.  You feel weak.  Your skin or the whites of your eyes look yellow (jaundice).  Your skin feels numb. This information is not intended to replace advice given to you by your health care provider. Make sure you discuss any questions you have with your health care provider. Document Released: 11/11/2010 Document Revised: 08/07/2015 Document Reviewed: 02/25/2014 Elsevier Interactive  Patient Education  2018 [place] OTC Cepacol mouthwash per [employer] instructions. Dermatology referral placed. Stop OTC Benadryl and started on rx Hydroxyzine 10mg - 1/2 tablet in morning, 1/2 tablet in afternoon, full tablet in evening (total of only 2 tablets daily). Recommend OTC Hydrocortisone cream for more itching relief. Nephrology referral placed per request. Continue daily weight each morning.  Lab results will be sent to your Cardiologist and they will call you with any instructions/changes to medications. You have only gained 3 lbs since your office visit with your Cardiologist 7 days ago and your leg swelling is stable without extreme redness. Please follow up with [place] in 1 month, sooner if needed. WONDERFUL TO SEE YOU!

## 2016-10-20 NOTE — Assessment & Plan Note (Addendum)
She denies increase in lower extremity edema/dyspnea at rest.  Per pt and pt's daughter/Judy, her legs are less red/swollen then last week. Weights listed below: 10/13/16 home scale 105.4, clinic scale at cards 107 10/20/16 home scale 110.0, clinic scale at [facility] In comparing home scale with various clinic scales there is a consistent 2 lb heavier reading at clinics probably due to clothing.  With this is in mind, she has had 5 lb weight gain in 7 days-will send message to cards. BMP drawn per cards. No distress noted and chest ausculation stable for pt.

## 2016-10-20 NOTE — Progress Notes (Signed)
Subjective:    Patient ID: [name], female    DOB: 1917/07/25, 83 y.o.   MRN: [id number]  HPI:  [name] is here for f/u: CHF, generalized pruritus, and oral sore.  The CHF is being managed by Dr. Lanice Shirts, and BMP was drawn today per orders of Rhonda Barrett PA-C.  She has been taking all medications as directed by Ms. Barrett since her appt on 10/13/2016. She denies increase in lower extremity edema/dyspnea at rest.  Weights listed below: 10/13/16 home scale 105.4, clinic scale at cards 107 10/20/16 home scale 110.0, clinic scale at [facility] In comparing home scale with various clinic scales there is a consistent 2 lb heavier reading at clinics probably due to clothing.  With this is in mind, she has had 5 lb weight gain in 7 days. She reports a strong appetite and with consistent morning BMs. Generalized pruritis- has been occurring since Jan 2017.  She has been treating with OTC Diphenhyrdamine 25mg  QHS for >1 year.  Sx relief has ceased months ago and her home health RN instructed to stop taking, last dose evening 10/17/16.  She has tried various OTC lotions with the best result with GoldBond. She also reports insomnia r/t "constant itching". Oral sore-has been present >1 year and she was previously treated for cold sores in the past however she states "this seems different".  She reports that the sore can make it difficult to eat.  She denies bleeding or drainage from site. He daughter/[name] is at bs and requests referral to Nephrologist "to see what is going on with her kidney's".  Patient Care Team    Relationship Specialty Notifications Start End  Mina Marble D, NP PCP - General Family Medicine  03/25/16   Croitoru, Dani Gobble, MD Consulting Physician Cardiology  03/25/16   Alfonzo Feller, [place] Management  Admissions 06/16/16   Clarene Essex, MD Consulting Physician Gastroenterology  08/27/16   Hurman Horn, MD Consulting Physician Ophthalmology  08/27/16     Jari Pigg, MD Consulting Physician Dermatology  10/20/16     Patient Active Problem List   Diagnosis Date Noted  . Mouth lesion 10/20/2016  . Generalized pruritus 10/20/2016  . Cellulitis 09/25/2016  . Hypokalemia 09/25/2016  . Itching 08/05/2016  . Abnormal TSH 06/23/2016  . CHF (congestive heart failure) ([place]) 06/14/2016  . COPD (chronic obstructive pulmonary disease) ([place]) 11/29/2015  . Abdominal aortic aneurysm (AAA) without rupture ([place]) 08/21/2015  . DNR (do not resuscitate)   . Weakness generalized   . GERD (gastroesophageal reflux disease) 07/04/2015  . Dysphagia 07/04/2015  . Transaminitis 06/24/2015  . HCAP (healthcare-associated pneumonia) 06/22/2015  . Pneumonia 06/22/2015  . COPD exacerbation ([place]) 06/21/2015  . Cough 06/09/2015  . Fever, unspecified 06/09/2015  . Chronic diastolic congestive heart failure ([place]) 04/24/2015  . Restless legs 04/24/2015  . Pain and swelling of left lower extremity 02/15/2015  . Candidiasis of perineum 02/15/2015  . Hemorrhoids 02/15/2015  . CKD (chronic kidney disease) stage 3, GFR 30-59 ml/min 02/06/2015  . Acute respiratory failure with hypoxia ([place]) 01/29/2015  . Acute pulmonary edema ([place]) 01/29/2015  . SOB (shortness of breath)   . Palliative care encounter   . Sepsis ([place]) 01/26/2015  . Cellulitis of leg, left 01/26/2015  . Protein-calorie malnutrition, severe ([place]) 11/06/2014  . Pressure ulcer 11/06/2014  . Acute diastolic CHF (congestive heart failure) ([place]) 11/04/2014  . Weakness 11/04/2014  . Hypertensive heart disease with CHF (congestive heart failure) ([place])  11/04/2014  . Macrocytic anemia 11/04/2014  . Cardiomyopathy, ischemic 11/04/2014  . CHF exacerbation ([place]) 11/03/2014  . Body mass index (BMI) of 20.0-20.9 in adult 06/23/2014  . Hard of hearing 06/23/2014  . Rectal bleeding 07/16/2013  . Hyperlipidemia, statin intolerance 04/30/2013  . Persistent atrial fibrillation ([place]) 04/30/2013  . Pacemaker - dual  chamber Medtronic 2009 04/30/2013  . AAA (abdominal aortic aneurysm) ([place]) 04/30/2013  . Bilateral carotid artery stenosis 04/30/2013  . PAD (peripheral artery disease) ([place]) 04/30/2013  . Constipation 08/31/2011  . Hypotension 08/28/2011  . S/P total hip arthroplasty 08/28/2011  . CAD (coronary artery disease) 08/28/2011  . Hypothyroid 08/28/2011  . Klebsiella cystitis 08/28/2011  . Thrombocytopenia ([place]) 08/28/2011  . SSS (sick sinus syndrome) ([place]) 10/05/2007     Past Medical History:  Diagnosis Date  . Abdominal aortic aneurysm ([place])   . Anemia    hx  . Asthma    hx  . Atrial fibrillation ([place])   . Blood transfusion   . Blood transfusion without reported diagnosis   . CAD (coronary artery disease)   . CHF (congestive heart failure) ([place])   . COPD (chronic obstructive pulmonary disease) ([place])   . GERD (gastroesophageal reflux disease)   . GI bleed 2007  . H/O hiatal hernia   . Heart attack ([place])   . Heart disease   . History of right hip replacement 2013  . Hyperlipidemia   . Hypertension   . Hypothyroidism   . Myocardial infarction ([place])   . Osteoporosis   . Pacemaker   . Renal failure, chronic   . SSS (sick sinus syndrome) ([place]) 10/05/2007   Medtronic Adapta  . Stroke ([place])   . TIA (transient ischemic attack)   . UTI (lower urinary tract infection)    hx     Past Surgical History:  Procedure Laterality Date  . CARDIAC CATHETERIZATION    . COLONOSCOPY    . CORONARY STENT PLACEMENT     x9  . FRACTURE SURGERY     femur fx, /w ORIF into HIP- 2008  . JOINT REPLACEMENT    . NM MYOCAR PERF WALL MOTION  10/08/2010   mild apical anterior ischemia  . PACEMAKER INSERTION  10/05/2007   Medtronic Adapta  . TOTAL HIP ARTHROPLASTY     planned for 08/27/2011 Left  . TOTAL HIP ARTHROPLASTY  08/27/2011   Procedure: TOTAL HIP ARTHROPLASTY;  Surgeon: Kerin Salen, MD;  Location: [facility];  Service: Orthopedics;  Laterality: Right;  . US ECHOCARDIOGRAPHY  08/23/2011   EF  50%,LA mod. dilated,mild to mod. mitral annular ca+,mod. MR,mild to mod TR,mild to mod. PH,trace AI.     Family History  Problem Relation Age of Onset  . Emphysema Father   . Heart disease Mother   . Cancer Brother   . Cancer Brother   . Cancer Sister   . Cancer Sister   . Diabetes Daughter   . Atrial fibrillation Daughter   . Atrial fibrillation Daughter   . Stroke Daughter   . Heart Problems Son      History  Drug Use No     History  Alcohol Use No     History  Smoking Status  . Former Smoker  . Types: Cigarettes  Smokeless Tobacco  . Never Used    Comment: quit 40 yrs ago- was smoking 2 packs a day at the most     Outpatient Encounter Prescriptions as of 10/20/2016  Medication Sig  . acetaminophen (TYLENOL) 500  MG tablet Take 1 tablet (500 mg total) by mouth at bedtime.  [name] amiodarone (PACERONE) 100 MG tablet Take 1 tablet (100 mg total) by mouth daily.  . Biotin 1000 MCG tablet Take 1 tablet (1 mg total) by mouth daily. (Patient taking differently: Take 5,000 mcg by mouth daily. )  . Calcium Carbonate-Vitamin D (CALCIUM 600+D) 600-400 MG-UNIT tablet Take 1 tablet by mouth daily.  . carboxymethylcellulose 1 % ophthalmic solution Apply 1 drop to eye 3 (three) times daily. (Patient taking differently: Apply 1 drop to eye 3 (three) times daily as needed (dry eyes). )  . clopidogrel (PLAVIX) 75 MG tablet TAKE 1 TABLET (75 MG TOTAL) BY MOUTH DAILY.  . cycloSPORINE (RESTASIS) 0.05 % ophthalmic emulsion Place 1 drop into both eyes 2 (two) times daily as needed (dryness).   . feeding supplement (BOOST HIGH PROTEIN) LIQD Take 1 Container by mouth See admin instructions. Drink 1 everyday and another as needed for nutrition supplement.  . furosemide (LASIX) 80 MG tablet TAKE 1 TABLET BY MOUTH DAILY EXCEPT FOR TUESDAYS AND FRIDAYS, YOU TAKE 1 TABLET BY MOUTH TWICE DAY  . levothyroxine (SYNTHROID, LEVOTHROID) 112 MCG tablet Take 1 tablet (112 mcg total) by mouth daily before  breakfast.  . Loratadine (CLARITIN PO) Take by mouth as needed.  . nitroGLYCERIN (NITROSTAT) 0.4 MG SL tablet Place 1 tablet (0.4 mg total) under the tongue every 5 (five) minutes as needed for chest pain (For a total of 3 tablets, if chest pain persist to call 911).  . ranitidine (ZANTAC) 300 MG capsule Take 1 capsule (300 mg total) by mouth every evening.  [name] rOPINIRole (REQUIP) 0.25 MG tablet Take 2 tablets (0.5 mg total) by mouth at bedtime.  . [DISCONTINUED] cephALEXin (KEFLEX) 250 MG capsule Take 1 capsule (250 mg total) by mouth 3 (three) times daily.  . [DISCONTINUED] Docosanol (ABREVA) 10 % CREA Apply 1 application topically 5 (five) times daily. Apply to affected area on lip  5 x day until healed  . hydrOXYzine (ATARAX/VISTARIL) 10 MG tablet Take 0.5 tablets (5 mg total) by mouth as directed. 1/2 tab in morning and afternoon.  Full tablet at night.  . isosorbide mononitrate (IMDUR) 30 MG 24 hr tablet Take 1 tablet (30 mg total) by mouth daily.  . [DISCONTINUED] diphenhydrAMINE (BENADRYL) 25 mg capsule Take 25 mg by mouth at bedtime as needed.   No facility-administered encounter medications on file as of 10/20/2016.     Allergies: Nubain [nalbuphine hcl]; Pineapple; Iodine; Mirtazapine; and Statins  Body mass index is 19.97 kg/m.  Blood pressure 98/64, pulse 96, height 5\' 3"  (1.6 m), weight 112 lb 12 oz (51.1 kg).     Review of Systems  Constitutional: Positive for activity change, fatigue and unexpected weight change. Negative for appetite change, chills, diaphoresis and fever.  Eyes: Negative for visual disturbance.  Respiratory: Negative for cough, chest tightness, shortness of breath, wheezing and stridor.   Cardiovascular: Positive for leg swelling. Negative for chest pain and palpitations.  Gastrointestinal: Negative for abdominal distention, abdominal pain, blood in stool, constipation, diarrhea, nausea and vomiting.  Genitourinary: Negative for difficulty urinating,  flank pain and hematuria.  Musculoskeletal: Positive for arthralgias, back pain, gait problem, joint swelling, myalgias, neck pain and neck stiffness.  Skin: Positive for color change and wound. Negative for pallor and rash.  Neurological: Negative for headaches.  Hematological: Bruises/bleeds easily.  Psychiatric/Behavioral: Positive for sleep disturbance. Negative for dysphoric mood, self-injury and suicidal ideas. The patient is not nervous/anxious.  Objective:   Physical Exam  Constitutional: She is oriented to person, place, and time. She appears well-developed and well-nourished. She appears distressed.  HENT:  Head: Normocephalic and atraumatic.  Right Ear: External ear normal.  Left Ear: External ear normal.  Eyes: Pupils are equal, round, and reactive to light. Conjunctivae are normal.  Cardiovascular: Normal rate, normal heart sounds and intact distal pulses.  An irregular rhythm present.  No murmur heard. Pulmonary/Chest: Effort normal and breath sounds normal. No respiratory distress. She has no wheezes. She has no rales. She exhibits no tenderness.  Musculoskeletal: She exhibits edema. She exhibits no tenderness.       Right ankle: She exhibits swelling. She exhibits normal pulse.       Left ankle: She exhibits swelling. She exhibits normal pulse.       Right lower leg: She exhibits swelling and edema. She exhibits no tenderness.       Left lower leg: She exhibits swelling and edema. She exhibits no tenderness.  Pitting edema in both lower extremities, R worse then L. Both extremities slightly pink, which is in an improvement from the red noted last week-per pt and pt's daughter/[name].  Neurological: She is alert and oriented to person, place, and time.  Skin: Skin is warm and dry. No rash noted. She is not diaphoretic. No pallor.     Wound on front of R shin covered with occlusive dressing.    Psychiatric: She has a normal mood and affect. Her behavior is normal.  Judgment and thought content normal.  Nursing note and vitals reviewed.         Assessment & Plan:   1. CKD (chronic kidney disease) stage 3, GFR 30-59 ml/min   2. Mouth lesion   3. Chronic diastolic congestive heart failure ([place])   4. Generalized pruritus     Mouth lesion Recommend OTC Cepacol mouthwash per manufacturer's instructions. Dermatology referral placed.   CKD (chronic kidney disease) stage 3, GFR 30-59 ml/min Referral placed to Nephrology.   Chronic diastolic congestive heart failure ([place]) She denies increase in lower extremity edema/dyspnea at rest.  Per pt and pt's daughter/[name], her legs are less red/swollen then last week. Weights listed below: 10/13/16 home scale 105.4, clinic scale at cards 107 10/20/16 home scale 110.0, clinic scale at [facility] 112 In comparing home scale with various clinic scales there is a consistent 2 lb heavier reading at clinics probably due to clothing.  With this is in mind, she has had 5 lb weight gain in 7 days-will send message to cards. BMP drawn per cards. No distress noted and chest ausculation stable for pt.  Generalized pruritus D/c'd OTC Diphenhydramine 25 mg due to failure to tx sx's. Started on low dose Hydroxyzine 10mg  (H1)- 1/2 tab am, 1/2 tab afternoon, full tab QHS. [place] Criteria recommends 50% typical dose if GFR <50, which her last GFR 24 on 10/10/16 Above dose is well below typical regime, however hopeful it will provide some reduction of sx's. Instructed to continue ranitidine 300mg  (H2) and loratadine (H1). Also recommended OTC Hydrocortisone cream per manufacturer's instructions.     FOLLOW-UP:  Return in about 4 weeks (around 11/17/2016) for Regular Follow Up, Lab Work.

## 2016-10-20 NOTE — Assessment & Plan Note (Signed)
D/c'd OTC Diphenhydramine 25 mg due to failure to tx sx's. Started on low dose Hydroxyzine 10mg  (H1)- 1/2 tab am, 1/2 tab afternoon, full tab QHS. Beers Criteria recommends 50% typical dose if GFR <50, which her last GFR 24 on 10/10/16 Above dose is well below typical regime, however hopeful it will provide some reduction of sx's. Instructed to continue ranitidine 300mg  (H2) and loratadine (H1). Also recommended OTC Hydrocortisone cream per manufacturer's instructions.

## 2016-10-21 ENCOUNTER — Inpatient Hospital Stay (HOSPITAL_COMMUNITY)
Admission: EM | Admit: 2016-10-21 | Discharge: 2016-11-13 | DRG: 291 | Disposition: E | Payer: Medicare HMO | Attending: Internal Medicine | Admitting: Internal Medicine

## 2016-10-21 ENCOUNTER — Other Ambulatory Visit: Payer: Self-pay | Admitting: *Deleted

## 2016-10-21 ENCOUNTER — Encounter (HOSPITAL_COMMUNITY): Payer: Self-pay

## 2016-10-21 ENCOUNTER — Emergency Department (HOSPITAL_COMMUNITY): Payer: Medicare HMO

## 2016-10-21 DIAGNOSIS — Z91018 Allergy to other foods: Secondary | ICD-10-CM

## 2016-10-21 DIAGNOSIS — F419 Anxiety disorder, unspecified: Secondary | ICD-10-CM | POA: Diagnosis not present

## 2016-10-21 DIAGNOSIS — Z825 Family history of asthma and other chronic lower respiratory diseases: Secondary | ICD-10-CM | POA: Diagnosis not present

## 2016-10-21 DIAGNOSIS — I5089 Other heart failure: Secondary | ICD-10-CM | POA: Diagnosis not present

## 2016-10-21 DIAGNOSIS — I509 Heart failure, unspecified: Secondary | ICD-10-CM

## 2016-10-21 DIAGNOSIS — Z8673 Personal history of transient ischemic attack (TIA), and cerebral infarction without residual deficits: Secondary | ICD-10-CM | POA: Diagnosis not present

## 2016-10-21 DIAGNOSIS — Z955 Presence of coronary angioplasty implant and graft: Secondary | ICD-10-CM | POA: Diagnosis not present

## 2016-10-21 DIAGNOSIS — Z823 Family history of stroke: Secondary | ICD-10-CM

## 2016-10-21 DIAGNOSIS — I255 Ischemic cardiomyopathy: Secondary | ICD-10-CM | POA: Diagnosis present

## 2016-10-21 DIAGNOSIS — Z87891 Personal history of nicotine dependence: Secondary | ICD-10-CM | POA: Diagnosis not present

## 2016-10-21 DIAGNOSIS — I25119 Atherosclerotic heart disease of native coronary artery with unspecified angina pectoris: Secondary | ICD-10-CM | POA: Diagnosis not present

## 2016-10-21 DIAGNOSIS — N183 Chronic kidney disease, stage 3 (moderate): Secondary | ICD-10-CM | POA: Diagnosis present

## 2016-10-21 DIAGNOSIS — M25511 Pain in right shoulder: Secondary | ICD-10-CM | POA: Diagnosis not present

## 2016-10-21 DIAGNOSIS — Z7902 Long term (current) use of antithrombotics/antiplatelets: Secondary | ICD-10-CM | POA: Diagnosis not present

## 2016-10-21 DIAGNOSIS — J449 Chronic obstructive pulmonary disease, unspecified: Secondary | ICD-10-CM | POA: Diagnosis not present

## 2016-10-21 DIAGNOSIS — Z66 Do not resuscitate: Secondary | ICD-10-CM | POA: Diagnosis not present

## 2016-10-21 DIAGNOSIS — I481 Persistent atrial fibrillation: Secondary | ICD-10-CM | POA: Diagnosis present

## 2016-10-21 DIAGNOSIS — N179 Acute kidney failure, unspecified: Secondary | ICD-10-CM | POA: Diagnosis not present

## 2016-10-21 DIAGNOSIS — N170 Acute kidney failure with tubular necrosis: Secondary | ICD-10-CM | POA: Diagnosis not present

## 2016-10-21 DIAGNOSIS — L89520 Pressure ulcer of left ankle, unstageable: Secondary | ICD-10-CM | POA: Diagnosis not present

## 2016-10-21 DIAGNOSIS — E785 Hyperlipidemia, unspecified: Secondary | ICD-10-CM | POA: Diagnosis present

## 2016-10-21 DIAGNOSIS — R0902 Hypoxemia: Secondary | ICD-10-CM

## 2016-10-21 DIAGNOSIS — R069 Unspecified abnormalities of breathing: Secondary | ICD-10-CM | POA: Diagnosis not present

## 2016-10-21 DIAGNOSIS — G8929 Other chronic pain: Secondary | ICD-10-CM | POA: Diagnosis present

## 2016-10-21 DIAGNOSIS — I5032 Chronic diastolic (congestive) heart failure: Secondary | ICD-10-CM | POA: Diagnosis not present

## 2016-10-21 DIAGNOSIS — R0602 Shortness of breath: Secondary | ICD-10-CM | POA: Diagnosis not present

## 2016-10-21 DIAGNOSIS — R451 Restlessness and agitation: Secondary | ICD-10-CM | POA: Diagnosis not present

## 2016-10-21 DIAGNOSIS — I959 Hypotension, unspecified: Secondary | ICD-10-CM | POA: Diagnosis not present

## 2016-10-21 DIAGNOSIS — Z950 Presence of cardiac pacemaker: Secondary | ICD-10-CM

## 2016-10-21 DIAGNOSIS — Z888 Allergy status to other drugs, medicaments and biological substances status: Secondary | ICD-10-CM

## 2016-10-21 DIAGNOSIS — I130 Hypertensive heart and chronic kidney disease with heart failure and stage 1 through stage 4 chronic kidney disease, or unspecified chronic kidney disease: Principal | ICD-10-CM | POA: Diagnosis present

## 2016-10-21 DIAGNOSIS — R918 Other nonspecific abnormal finding of lung field: Secondary | ICD-10-CM | POA: Diagnosis not present

## 2016-10-21 DIAGNOSIS — I4891 Unspecified atrial fibrillation: Secondary | ICD-10-CM | POA: Diagnosis not present

## 2016-10-21 DIAGNOSIS — J9601 Acute respiratory failure with hypoxia: Secondary | ICD-10-CM | POA: Diagnosis not present

## 2016-10-21 DIAGNOSIS — Z515 Encounter for palliative care: Secondary | ICD-10-CM

## 2016-10-21 DIAGNOSIS — I2510 Atherosclerotic heart disease of native coronary artery without angina pectoris: Secondary | ICD-10-CM | POA: Diagnosis present

## 2016-10-21 DIAGNOSIS — Z96643 Presence of artificial hip joint, bilateral: Secondary | ICD-10-CM | POA: Diagnosis present

## 2016-10-21 DIAGNOSIS — E039 Hypothyroidism, unspecified: Secondary | ICD-10-CM | POA: Diagnosis present

## 2016-10-21 DIAGNOSIS — M810 Age-related osteoporosis without current pathological fracture: Secondary | ICD-10-CM | POA: Diagnosis present

## 2016-10-21 DIAGNOSIS — J90 Pleural effusion, not elsewhere classified: Secondary | ICD-10-CM | POA: Diagnosis not present

## 2016-10-21 DIAGNOSIS — Z809 Family history of malignant neoplasm, unspecified: Secondary | ICD-10-CM

## 2016-10-21 DIAGNOSIS — R2681 Unsteadiness on feet: Secondary | ICD-10-CM | POA: Diagnosis not present

## 2016-10-21 DIAGNOSIS — I447 Left bundle-branch block, unspecified: Secondary | ICD-10-CM | POA: Diagnosis not present

## 2016-10-21 DIAGNOSIS — Z885 Allergy status to narcotic agent status: Secondary | ICD-10-CM

## 2016-10-21 DIAGNOSIS — I714 Abdominal aortic aneurysm, without rupture: Secondary | ICD-10-CM | POA: Diagnosis present

## 2016-10-21 DIAGNOSIS — Z833 Family history of diabetes mellitus: Secondary | ICD-10-CM

## 2016-10-21 DIAGNOSIS — Z8249 Family history of ischemic heart disease and other diseases of the circulatory system: Secondary | ICD-10-CM | POA: Diagnosis not present

## 2016-10-21 DIAGNOSIS — I5043 Acute on chronic combined systolic (congestive) and diastolic (congestive) heart failure: Secondary | ICD-10-CM | POA: Diagnosis not present

## 2016-10-21 DIAGNOSIS — I872 Venous insufficiency (chronic) (peripheral): Secondary | ICD-10-CM | POA: Diagnosis present

## 2016-10-21 DIAGNOSIS — M6281 Muscle weakness (generalized): Secondary | ICD-10-CM | POA: Diagnosis not present

## 2016-10-21 DIAGNOSIS — K219 Gastro-esophageal reflux disease without esophagitis: Secondary | ICD-10-CM | POA: Diagnosis present

## 2016-10-21 DIAGNOSIS — I4819 Other persistent atrial fibrillation: Secondary | ICD-10-CM | POA: Diagnosis present

## 2016-10-21 DIAGNOSIS — I110 Hypertensive heart disease with heart failure: Secondary | ICD-10-CM | POA: Diagnosis not present

## 2016-10-21 LAB — COMPREHENSIVE METABOLIC PANEL
ALBUMIN: 3.1 g/dL — AB (ref 3.5–5.0)
ALK PHOS: 101 U/L (ref 38–126)
ALT: 17 U/L (ref 14–54)
ANION GAP: 10 (ref 5–15)
AST: 36 U/L (ref 15–41)
BILIRUBIN TOTAL: 0.9 mg/dL (ref 0.3–1.2)
BUN: 45 mg/dL — ABNORMAL HIGH (ref 6–20)
CALCIUM: 8.9 mg/dL (ref 8.9–10.3)
CO2: 35 mmol/L — ABNORMAL HIGH (ref 22–32)
Chloride: 95 mmol/L — ABNORMAL LOW (ref 101–111)
Creatinine, Ser: 1.94 mg/dL — ABNORMAL HIGH (ref 0.44–1.00)
GFR calc Af Amer: 24 mL/min — ABNORMAL LOW (ref >60)
GFR calc non Af Amer: 21 mL/min — ABNORMAL LOW (ref >60)
GLUCOSE: 73 mg/dL (ref 65–99)
Potassium: 4.6 mmol/L (ref 3.5–5.1)
Sodium: 140 mmol/L (ref 135–145)
TOTAL PROTEIN: 6.2 g/dL — AB (ref 6.5–8.1)

## 2016-10-21 LAB — URINALYSIS, ROUTINE W REFLEX MICROSCOPIC
Bilirubin Urine: NEGATIVE
GLUCOSE, UA: NEGATIVE mg/dL
KETONES UR: NEGATIVE mg/dL
Nitrite: POSITIVE — AB
PROTEIN: 30 mg/dL — AB
SQUAMOUS EPITHELIAL / LPF: NONE SEEN
Specific Gravity, Urine: 1.011 (ref 1.005–1.030)
pH: 6 (ref 5.0–8.0)

## 2016-10-21 LAB — BRAIN NATRIURETIC PEPTIDE: B Natriuretic Peptide: 1697.4 pg/mL — ABNORMAL HIGH (ref 0.0–100.0)

## 2016-10-21 LAB — CBC WITH DIFFERENTIAL/PLATELET
BASOS PCT: 0 %
Basophils Absolute: 0 10*3/uL (ref 0.0–0.1)
Eosinophils Absolute: 0.1 10*3/uL (ref 0.0–0.7)
Eosinophils Relative: 1 %
HEMATOCRIT: 40.7 % (ref 36.0–46.0)
HEMOGLOBIN: 12.5 g/dL (ref 12.0–15.0)
LYMPHS ABS: 1.3 10*3/uL (ref 0.7–4.0)
LYMPHS PCT: 18 %
MCH: 32.8 pg (ref 26.0–34.0)
MCHC: 30.7 g/dL (ref 30.0–36.0)
MCV: 106.8 fL — ABNORMAL HIGH (ref 78.0–100.0)
MONOS PCT: 10 %
Monocytes Absolute: 0.7 10*3/uL (ref 0.1–1.0)
NEUTROS ABS: 5.3 10*3/uL (ref 1.7–7.7)
NEUTROS PCT: 71 %
Platelets: 122 10*3/uL — ABNORMAL LOW (ref 150–400)
RBC: 3.81 MIL/uL — ABNORMAL LOW (ref 3.87–5.11)
RDW: 16.9 % — ABNORMAL HIGH (ref 11.5–15.5)
WBC: 7.4 10*3/uL (ref 4.0–10.5)

## 2016-10-21 LAB — I-STAT CG4 LACTIC ACID, ED: LACTIC ACID, VENOUS: 1.83 mmol/L (ref 0.5–1.9)

## 2016-10-21 LAB — I-STAT TROPONIN, ED: Troponin i, poc: 0.07 ng/mL (ref 0.00–0.08)

## 2016-10-21 LAB — MAGNESIUM: Magnesium: 2.2 mg/dL (ref 1.7–2.4)

## 2016-10-21 MED ORDER — FUROSEMIDE 10 MG/ML IJ SOLN: 60.0000 mg | Freq: Every day | INTRAMUSCULAR | Status: DC

## 2016-10-21 MED ORDER — HYDROXYZINE HCL 10 MG PO TABS
5.0000 mg | ORAL_TABLET | Freq: Every day | ORAL | Status: DC
  Administered 2016-10-22 – 2016-10-24 (×3): 5 mg via ORAL
  Filled 2016-10-21 (×3): qty 1

## 2016-10-21 MED ORDER — CLOPIDOGREL BISULFATE 75 MG PO TABS
75.0000 mg | ORAL_TABLET | Freq: Every day | ORAL | Status: DC
  Administered 2016-10-22 – 2016-10-24 (×3): 75 mg via ORAL
  Filled 2016-10-21 (×3): qty 1

## 2016-10-21 MED ORDER — ACETAMINOPHEN 500 MG PO TABS
500.0000 mg | ORAL_TABLET | Freq: Every day | ORAL | Status: DC
  Administered 2016-10-22 – 2016-10-24 (×4): 500 mg via ORAL
  Filled 2016-10-21 (×4): qty 1

## 2016-10-21 MED ORDER — BOOST HIGH PROTEIN PO LIQD
1.0000 | Freq: Every day | ORAL | Status: DC
  Filled 2016-10-21: qty 237

## 2016-10-21 MED ORDER — SODIUM CHLORIDE 0.9% FLUSH
3.0000 mL | Freq: Two times a day (BID) | INTRAVENOUS | Status: DC
  Administered 2016-10-22: 3 mL via INTRAVENOUS
  Administered 2016-10-22: 0.3 mL via INTRAVENOUS
  Administered 2016-10-22 – 2016-10-24 (×4): 3 mL via INTRAVENOUS

## 2016-10-21 MED ORDER — AMIODARONE HCL 100 MG PO TABS
100.0000 mg | ORAL_TABLET | Freq: Every day | ORAL | Status: DC
  Administered 2016-10-22 – 2016-10-24 (×3): 100 mg via ORAL
  Filled 2016-10-21 (×4): qty 1

## 2016-10-21 MED ORDER — FAMOTIDINE 20 MG PO TABS
40.0000 mg | ORAL_TABLET | Freq: Every day | ORAL | Status: DC
  Administered 2016-10-22: 40 mg via ORAL
  Filled 2016-10-21: qty 2

## 2016-10-21 MED ORDER — ENSURE ENLIVE PO LIQD
237.0000 mL | Freq: Every day | ORAL | Status: DC
  Administered 2016-10-22 – 2016-10-24 (×3): 237 mL via ORAL

## 2016-10-21 MED ORDER — SODIUM CHLORIDE 0.9 % IV SOLN: 250.0000 mL | INTRAVENOUS | Status: DC | PRN

## 2016-10-21 MED ORDER — CALCIUM CARBONATE-VITAMIN D 500-200 MG-UNIT PO TABS
1.0000 | ORAL_TABLET | Freq: Every day | ORAL | Status: DC
  Administered 2016-10-22 – 2016-10-24 (×3): 1 via ORAL
  Filled 2016-10-21 (×3): qty 1

## 2016-10-21 MED ORDER — HYDROXYZINE HCL 10 MG PO TABS: 5.0000 mg | ORAL_TABLET | ORAL | Status: DC

## 2016-10-21 MED ORDER — BIOTIN 1000 MCG PO TABS: 1000.0000 ug | ORAL_TABLET | Freq: Every day | ORAL | Status: DC

## 2016-10-21 MED ORDER — ONDANSETRON HCL 4 MG/2ML IJ SOLN
4.0000 mg | Freq: Four times a day (QID) | INTRAMUSCULAR | Status: DC | PRN
  Administered 2016-10-25 (×2): 4 mg via INTRAVENOUS
  Filled 2016-10-21 (×3): qty 2

## 2016-10-21 MED ORDER — HEPARIN SODIUM (PORCINE) 5000 UNIT/ML IJ SOLN
5000.0000 [IU] | Freq: Three times a day (TID) | INTRAMUSCULAR | Status: DC
  Administered 2016-10-22 – 2016-10-24 (×7): 5000 [IU] via SUBCUTANEOUS
  Filled 2016-10-21 (×9): qty 1

## 2016-10-21 MED ORDER — LORATADINE 10 MG PO TABS
10.0000 mg | ORAL_TABLET | Freq: Every day | ORAL | Status: DC
  Administered 2016-10-22 – 2016-10-24 (×3): 10 mg via ORAL
  Filled 2016-10-21 (×3): qty 1

## 2016-10-21 MED ORDER — HYDROXYZINE HCL 10 MG PO TABS
10.0000 mg | ORAL_TABLET | Freq: Every day | ORAL | Status: DC
  Administered 2016-10-22 – 2016-10-24 (×4): 10 mg via ORAL
  Filled 2016-10-21 (×4): qty 1

## 2016-10-21 MED ORDER — LEVOTHYROXINE SODIUM 112 MCG PO TABS
112.0000 ug | ORAL_TABLET | Freq: Every day | ORAL | Status: DC
  Administered 2016-10-22 – 2016-10-24 (×3): 112 ug via ORAL
  Filled 2016-10-21 (×3): qty 1

## 2016-10-21 MED ORDER — CLINDAMYCIN PHOSPHATE 600 MG/50ML IV SOLN
600.0000 mg | Freq: Once | INTRAVENOUS | Status: AC
  Administered 2016-10-21: 600 mg via INTRAVENOUS
  Filled 2016-10-21: qty 50

## 2016-10-21 MED ORDER — FUROSEMIDE 10 MG/ML IJ SOLN
40.0000 mg | Freq: Once | INTRAMUSCULAR | Status: AC
  Administered 2016-10-21: 40 mg via INTRAVENOUS
  Filled 2016-10-21: qty 4

## 2016-10-21 MED ORDER — ACETAMINOPHEN 325 MG PO TABS
650.0000 mg | ORAL_TABLET | ORAL | Status: DC | PRN
  Administered 2016-10-23: 650 mg via ORAL
  Filled 2016-10-21: qty 2

## 2016-10-21 MED ORDER — CYCLOSPORINE 0.05 % OP EMUL: 1.0000 [drp] | Freq: Two times a day (BID) | OPHTHALMIC | Status: DC | PRN

## 2016-10-21 MED ORDER — SODIUM CHLORIDE 0.9% FLUSH: 3.0000 mL | INTRAVENOUS | Status: DC | PRN

## 2016-10-21 MED ORDER — POLYVINYL ALCOHOL 1.4 % OP SOLN: 1.0000 [drp] | Freq: Three times a day (TID) | OPHTHALMIC | Status: DC | PRN

## 2016-10-21 MED ORDER — ROPINIROLE HCL 0.5 MG PO TABS
0.5000 mg | ORAL_TABLET | Freq: Every day | ORAL | Status: DC
  Administered 2016-10-22 – 2016-10-23 (×3): 0.5 mg via ORAL
  Filled 2016-10-21 (×4): qty 1

## 2016-10-21 NOTE — Patient Outreach (Signed)
[facility]) Care Management  11/02/2016  GWENLYN HOTTINGER [birth year] [id number]  Red EMMI flag message  Placed call to patient daughter [name] to follow up on patient , received EMMI red flag message related to weight gain , 110 pounds on yesterday. [name] discussed MD aware of weight gain on yesterday  As patient had PCP visit.  Daughter reports patient weight today is still at 110 on today , she states patient had a good afternoon on yesterday and slept about 3 hours last night after taking new medication to help with itching. Daughter reports today is not a good day, she is a little more winded on today, Home health RN currently at  home visit for routine visit and will  evaluate patient further. Daughter  states patient  has good days and bad days and I know that will happen.     Plan Reinforced with daughter to notify MD of worsening of symptoms/concern , encouraged to call RNCM as needed. Will follow up with patient in next week.   [name], RN, [place] Management Coordinator  [phone]- Mobile [phone]- Toll Free Main Office

## 2016-10-21 NOTE — Progress Notes (Signed)
Microbiology called regarding the patient's need for blood cultures. Dr. Alcario Drought called to verify wether the cultures are needed or not. Per Dr. Alcario Drought the blood cultures are not need. Microbiology made aware.

## 2016-10-21 NOTE — H&P (Signed)
History and Physical    JYNESIS NAKAMURA WGN:[id number] DOB: [birth year] DOA: 10/24/2016  PCP: Esaw Grandchild, NP  Patient coming from: Home  I have personally briefly reviewed patient's old medical records in [place]  Chief Complaint: SOB, weight gain  HPI: [name] is a 83 y.o. female with medical history significant of CAD, ICM, CHF with most recent EF 25-30% on 2d echo last month, CKD at least stage 3.  Patient recently admitted to our service 7/14-7/16 with dehydration / overdiuresis at that time, admission weight ~92.x lbs.  Zaroxolyn stopped and lasix dose reduced.  Discharge weight ~95 lbs.  Creat at time of admission 1.9, improved to 1.6 at discharge.  EF noted to be 25-30% which is significantly down from prior echo in 2016.   Now patient presents to ED with c/o SOB, peripheral edema, recent weight gain of 5lbs in past 7 days.  Weight now 110.8 lbs (daughter keeps a log of daily weights on patient).  Family desires "non-invasive" treatments for patient who they confirm is DNI/DNR.  They understand that she is "between a rock and a hard place" when it comes to fluid overload and declining renal function.  ED Course: Given 40mg  IV lasix.  BP running on the low side.  Given dose of clinda for concern for BLE cellulitis.  No SIRS.  Creat 1.9, new O2 requirement.   Review of Systems: As per HPI otherwise 10 point review of systems negative.   Past Medical History:  Diagnosis Date  . Abdominal aortic aneurysm ([place])   . Anemia    hx  . Asthma    hx  . Atrial fibrillation ([place])   . Blood transfusion   . Blood transfusion without reported diagnosis   . CAD (coronary artery disease)   . CHF (congestive heart failure) ([place])   . COPD (chronic obstructive pulmonary disease) ([place])   . GERD (gastroesophageal reflux disease)   . GI bleed 2007  . H/O hiatal hernia   . Heart attack ([place])   . Heart disease   . History of right hip replacement 2013  . Hyperlipidemia   .  Hypertension   . Hypothyroidism   . Myocardial infarction ([place])   . Osteoporosis   . Pacemaker   . Renal failure, chronic   . SSS (sick sinus syndrome) ([place]) 10/05/2007   Medtronic Adapta  . Stroke ([place])   . TIA (transient ischemic attack)   . UTI (lower urinary tract infection)    hx    Past Surgical History:  Procedure Laterality Date  . CARDIAC CATHETERIZATION    . COLONOSCOPY    . CORONARY STENT PLACEMENT     x9  . FRACTURE SURGERY     femur fx, /w ORIF into HIP- 2008  . JOINT REPLACEMENT    . NM MYOCAR PERF WALL MOTION  10/08/2010   mild apical anterior ischemia  . PACEMAKER INSERTION  10/05/2007   Medtronic Adapta  . TOTAL HIP ARTHROPLASTY     planned for 08/27/2011 Left  . TOTAL HIP ARTHROPLASTY  08/27/2011   Procedure: TOTAL HIP ARTHROPLASTY;  Surgeon: Kerin Salen, MD;  Location: [facility];  Service: Orthopedics;  Laterality: Right;  . US ECHOCARDIOGRAPHY  08/23/2011   EF 50%,LA mod. dilated,mild to mod. mitral annular ca+,mod. MR,mild to mod TR,mild to mod. PH,trace AI.     reports that she has quit smoking. Her smoking use included Cigarettes. She has never used smokeless tobacco. She reports that she  does not drink alcohol or use drugs.  Allergies  Allergen Reactions  . Nubain [Nalbuphine Hcl] Anaphylaxis  . Pineapple Other (See Comments)    Listed on [facility] 06/2015  . Iodine Rash  . Mirtazapine Other (See Comments)    "Weakness in legs"  . Statins Other (See Comments)    Leg weakness    Family History  Problem Relation Age of Onset  . Emphysema Father   . Heart disease Mother   . Cancer Brother   . Cancer Brother   . Cancer Sister   . Cancer Sister   . Diabetes Daughter   . Atrial fibrillation Daughter   . Atrial fibrillation Daughter   . Stroke Daughter   . Heart Problems Son      Prior to Admission medications   Medication Sig Start Date End Date Taking? Authorizing Provider  acetaminophen (TYLENOL) 500 MG tablet Take 1 tablet (500 mg total) by  mouth at bedtime. 04/26/16  Yes Danford, Valetta Fuller D, NP  amiodarone (PACERONE) 100 MG tablet Take 1 tablet (100 mg total) by mouth daily. 08/18/16  Yes Croitoru, Mihai, MD  Biotin 1000 MCG tablet Take 1 tablet (1 mg total) by mouth daily. 04/26/16  Yes Danford, Valetta Fuller D, NP  Calcium Carbonate-Vitamin D (CALCIUM 600+D) 600-400 MG-UNIT tablet Take 1 tablet by mouth daily. 04/26/16  Yes Danford, Katy D, NP  carboxymethylcellulose 1 % ophthalmic solution Apply 1 drop to eye 3 (three) times daily. Patient taking differently: Apply 1 drop to eye 3 (three) times daily as needed (dry eyes).  04/26/16  Yes Danford, Valetta Fuller D, NP  clopidogrel (PLAVIX) 75 MG tablet TAKE 1 TABLET (75 MG TOTAL) BY MOUTH DAILY. 08/06/16  Yes Croitoru, Mihai, MD  cycloSPORINE (RESTASIS) 0.05 % ophthalmic emulsion Place 1 drop into both eyes 2 (two) times daily as needed (dryness).    Yes [provider]  feeding supplement (BOOST HIGH PROTEIN) LIQD Take 1 Container by mouth See admin instructions. Drink 1 everyday and another as needed for nutrition supplement.   Yes [provider]  furosemide (LASIX) 80 MG tablet TAKE 1 TABLET BY MOUTH DAILY EXCEPT FOR TUESDAYS AND FRIDAYS, YOU TAKE 1 TABLET BY MOUTH TWICE DAY 10/13/16  Yes Barrett, Rhonda G, PA-C  hydrOXYzine (ATARAX/VISTARIL) 10 MG tablet Take 0.5 tablets (5 mg total) by mouth as directed. 1/2 tab in morning and afternoon.  Full tablet at night. 10/20/16  Yes Danford, Valetta Fuller D, NP  isosorbide mononitrate (IMDUR) 30 MG 24 hr tablet Take 1 tablet (30 mg total) by mouth daily. 06/24/16 10/13/2016 Yes Croitoru, Mihai, MD  levothyroxine (SYNTHROID, LEVOTHROID) 112 MCG tablet Take 1 tablet (112 mcg total) by mouth daily before breakfast. 09/08/16  Yes Danford, Valetta Fuller D, NP  loratadine (CLARITIN) 10 MG tablet Take 10 mg by mouth daily.    Yes [provider]  nitroGLYCERIN (NITROSTAT) 0.4 MG SL tablet Place 1 tablet (0.4 mg total) under the tongue every 5 (five) minutes as needed for chest  pain (For a total of 3 tablets, if chest pain persist to call 911). 10/15/14  Yes Eubanks, Carlos American, NP  ranitidine (ZANTAC) 300 MG capsule Take 1 capsule (300 mg total) by mouth every evening. 04/26/16  Yes Danford, Valetta Fuller D, NP  rOPINIRole (REQUIP) 0.25 MG tablet Take 2 tablets (0.5 mg total) by mouth at bedtime. 08/05/16  Yes Mina Marble D, NP    Physical Exam: Vitals:   11/07/2016 1745 11/10/2016 1900 10/24/2016 1911 10/22/2016 2015  BP: 99/63 (!) 98/59  [name])  94/57  Pulse:  96 (!) 103 (!) 108  Resp: 15 17 19  (!) 21  Temp:      TempSrc:      SpO2:   96% 97%    Constitutional: NAD, calm, comfortable Eyes: PERRL, lids and conjunctivae normal ENMT: Mucous membranes are moist. Posterior pharynx clear of any exudate or lesions.Normal dentition.  Neck: normal, supple, no masses, no thyromegaly Respiratory: clear to auscultation bilaterally, no wheezing, no crackles. Normal respiratory effort. No accessory muscle use.  Cardiovascular: Irr, irr, mild tachy to low 100s Abdomen: no tenderness, no masses palpated. No hepatosplenomegaly. Bowel sounds positive.  Musculoskeletal: no clubbing / cyanosis. No joint deformity upper and lower extremities. Good ROM, no contractures. Normal muscle tone.  Skin: no rashes, lesions, ulcers. No induration Neurologic: CN 2-12 grossly intact. Sensation intact, DTR normal. Strength 5/5 in all 4.  Psychiatric: Normal judgment and insight. Alert and oriented x 3. Normal mood.    Labs on Admission: I have personally reviewed following labs and imaging studies  CBC:  Recent Labs Lab 10/15/2016 1709  WBC 7.4  NEUTROABS 5.3  HGB 12.5  HCT 40.7  MCV 106.8*  PLT 811*   Basic Metabolic Panel:  Recent Labs Lab 10/30/2016 1709  NA 140  K 4.6  CL 95*  CO2 35*  GLUCOSE 73  BUN 45*  CREATININE 1.94*  CALCIUM 8.9   GFR: Estimated Creatinine Clearance: 13.4 mL/min (A) (by C-G formula based on SCr of 1.94 mg/dL (H)). Liver Function Tests:  Recent Labs Lab  10/28/2016 1709  AST 36  ALT 17  ALKPHOS 101  BILITOT 0.9  PROT 6.2*  ALBUMIN 3.1*   No results for input(s): LIPASE, AMYLASE in the last 168 hours. No results for input(s): AMMONIA in the last 168 hours. Coagulation Profile: No results for input(s): INR, PROTIME in the last 168 hours. Cardiac Enzymes: No results for input(s): CKTOTAL, CKMB, CKMBINDEX, TROPONINI in the last 168 hours. BNP (last 3 results) No results for input(s): PROBNP in the last 8760 hours. HbA1C: No results for input(s): HGBA1C in the last 72 hours. CBG: No results for input(s): GLUCAP in the last 168 hours. Lipid Profile: No results for input(s): CHOL, HDL, LDLCALC, TRIG, CHOLHDL, LDLDIRECT in the last 72 hours. Thyroid Function Tests: No results for input(s): TSH, T4TOTAL, FREET4, T3FREE, THYROIDAB in the last 72 hours. Anemia Panel: No results for input(s): VITAMINB12, FOLATE, FERRITIN, TIBC, IRON, RETICCTPCT in the last 72 hours. Urine analysis:    Component Value Date/Time   COLORURINE STRAW (A) 09/25/2016 1352   APPEARANCEUR CLEAR 09/25/2016 1352   LABSPEC 1.008 09/25/2016 1352   PHURINE 7.0 09/25/2016 1352   GLUCOSEU NEGATIVE 09/25/2016 1352   HGBUR NEGATIVE 09/25/2016 1352   BILIRUBINUR NEGATIVE 09/25/2016 1352   BILIRUBINUR neg 06/19/2014 1919   KETONESUR NEGATIVE 09/25/2016 1352   PROTEINUR NEGATIVE 09/25/2016 1352   UROBILINOGEN 0.2 01/26/2015 1230   NITRITE NEGATIVE 09/25/2016 1352   LEUKOCYTESUR NEGATIVE 09/25/2016 1352    Radiological Exams on Admission: Dg Chest 2 View  Result Date: 10/16/2016 CLINICAL DATA:  Four pound increase in weight over the past 2 days with swelling of the ankles and pitting edema. Dyspnea. EXAM: CHEST  2 VIEW COMPARISON:  09/25/2016 FINDINGS: New bilateral small pleural effusions are noted with pulmonary vascular redistribution and stable cardiomegaly. Likely bibasilar compressive atelectasis secondary to the pleural effusions. Aortic atherosclerosis is noted.  Left-sided pacemaker apparatus with right atrial and right ventricular leads are present. No acute nor suspicious osseous abnormality.  The bones are demineralized and there is stable midthoracic kyphosis. IMPRESSION: Cardiomegaly with aortic atherosclerosis. Interval development of CHF with small bilateral layering effusions. Electronically Signed   By: Ashley Royalty M.D.   On: 10/14/2016 17:48    EKG: Independently reviewed.  Assessment/Plan Principal Problem:   Acute on chronic combined systolic and diastolic CHF (congestive heart failure) (HCC) Active Problems:   Hypotension   Persistent atrial fibrillation (HCC)   Cardiomyopathy, ischemic    1. Acute on chronic combined CHF - 1. EF 25-30% 2. CHF pathway 3. Will try to "gently diurese" patient with IV lasix 4. 60mg  IV Daily ordered 5. See HPI for weight discussion, but ideally want to get up to a max of ~15lbs fluid off if kidneys tolerate. 6. Tele monitor 2. Hypotension - 1. Hold home Imdur 3. A.Fib - 1. Continue Amiodarone 2. Not on anticoagulants secondary to bleed / fall risk  DVT prophylaxis: SCDs Code Status: DNR/DNI and "nothing invasive".  Family is quite reasonable given patients advanced age, if diuresis fails then next step is likely palliative care consult. Family Communication: Daughter at bedside Disposition Plan: TBD Consults called: None, EDP spoke with cardiology Admission status: Admit to inpatient   [place], [facility] Pager [phone]  If 7AM-7PM, please contact day team taking care of patient [email or web address] Password [email or web address]  10/30/2016, 8:56 PM

## 2016-10-21 NOTE — ED Triage Notes (Signed)
Per [facility]: Pt from home, 4 lb weight increase in about 2 days with mild swelling in ankles, pitting edema +1 with some shortness of breath. Pt is in AFIB and has a non functioning pacemaker in place. Pt has weak thready pulse. Pt and family told EMS at home that they did not want anything invasive, but the pt was ok with EMS attempting to start an IV. Pt is alert and oriented with EMS. Pt on 4 L [place], does not normally wear oxygen. Lung sounds were diminished bilaterally. Pt denies any pain.

## 2016-10-21 NOTE — ED Notes (Signed)
Admitting MD at bedside.

## 2016-10-21 NOTE — ED Provider Notes (Signed)
[facility] DEPT Provider Note   CSN: [id number] Arrival date & time: 11/09/2016  1622   History   Chief Complaint Chief Complaint  Patient presents with  . Shortness of Breath    HPI [name] is a 83 y.o. female who presented with acute onset of shortness of breath this am upon arising from bed. She has an extensive chronic medical history significant for HTN, TIA, AAA, CAD, SSS s/p pacemaker, MI, A-fib, COPD, CKD, stroke, and CHF to name a few. She stated that the SOB was not worse with lying flat vs sitting or standing. It prevented her from ambulating easily to and from the bathroom as she normally does due to the inability to catch her breath not secondary to weakness. She denied weakness stating that it was SOB that caused her concerns instead. Patient stated that her only pain was in her right shoulder area which has been an issues for some time. Her only new medication is hydroxyzine which she started for severe "itching" in her lower extremities secondary to her chronic lower extremity peripheral edema.  HPI  Past Medical History:  Diagnosis Date  . Abdominal aortic aneurysm ([place])   . Anemia    hx  . Asthma    hx  . Atrial fibrillation ([place])   . Blood transfusion   . Blood transfusion without reported diagnosis   . CAD (coronary artery disease)   . CHF (congestive heart failure) ([place])   . COPD (chronic obstructive pulmonary disease) ([place])   . GERD (gastroesophageal reflux disease)   . GI bleed 2007  . H/O hiatal hernia   . Heart attack ([place])   . Heart disease   . History of right hip replacement 2013  . Hyperlipidemia   . Hypertension   . Hypothyroidism   . Myocardial infarction ([place])   . Osteoporosis   . Pacemaker   . Renal failure, chronic   . SSS (sick sinus syndrome) ([place]) 10/05/2007   Medtronic Adapta  . Stroke ([place])   . TIA (transient ischemic attack)   . UTI (lower urinary tract infection)    hx    Patient Active Problem List   Diagnosis Date  Noted  . Acute on chronic combined systolic and diastolic CHF (congestive heart failure) ([place]) 11/10/2016  . Mouth lesion 10/20/2016  . Generalized pruritus 10/20/2016  . Cellulitis 09/25/2016  . Hypokalemia 09/25/2016  . Itching 08/05/2016  . Abnormal TSH 06/23/2016  . CHF (congestive heart failure) ([place]) 06/14/2016  . COPD (chronic obstructive pulmonary disease) ([place]) 11/29/2015  . Abdominal aortic aneurysm (AAA) without rupture ([place]) 08/21/2015  . DNR (do not resuscitate)   . Weakness generalized   . GERD (gastroesophageal reflux disease) 07/04/2015  . Dysphagia 07/04/2015  . Transaminitis 06/24/2015  . HCAP (healthcare-associated pneumonia) 06/22/2015  . Pneumonia 06/22/2015  . COPD exacerbation ([place]) 06/21/2015  . Cough 06/09/2015  . Fever, unspecified 06/09/2015  . Chronic diastolic congestive heart failure ([place]) 04/24/2015  . Restless legs 04/24/2015  . Pain and swelling of left lower extremity 02/15/2015  . Candidiasis of perineum 02/15/2015  . Hemorrhoids 02/15/2015  . CKD (chronic kidney disease) stage 3, GFR 30-59 ml/min 02/06/2015  . Acute respiratory failure with hypoxia ([place]) 01/29/2015  . Acute pulmonary edema ([place]) 01/29/2015  . SOB (shortness of breath)   . Palliative care encounter   . Sepsis ([place]) 01/26/2015  . Cellulitis of leg, left 01/26/2015  . Protein-calorie malnutrition, severe ([place]) 11/06/2014  . Pressure ulcer 11/06/2014  . Acute diastolic  CHF (congestive heart failure) ([place]) 11/04/2014  . Weakness 11/04/2014  . Hypertensive heart disease with CHF (congestive heart failure) ([place]) 11/04/2014  . Macrocytic anemia 11/04/2014  . Cardiomyopathy, ischemic 11/04/2014  . CHF exacerbation ([place]) 11/03/2014  . Body mass index (BMI) of 20.0-20.9 in adult 06/23/2014  . Hard of hearing 06/23/2014  . Rectal bleeding 07/16/2013  . Hyperlipidemia, statin intolerance 04/30/2013  . Persistent atrial fibrillation ([place]) 04/30/2013  . Pacemaker - dual chamber  Medtronic 2009 04/30/2013  . AAA (abdominal aortic aneurysm) ([place]) 04/30/2013  . Bilateral carotid artery stenosis 04/30/2013  . PAD (peripheral artery disease) ([place]) 04/30/2013  . Constipation 08/31/2011  . Hypotension 08/28/2011  . S/P total hip arthroplasty 08/28/2011  . CAD (coronary artery disease) 08/28/2011  . Hypothyroid 08/28/2011  . Klebsiella cystitis 08/28/2011  . Thrombocytopenia ([place]) 08/28/2011  . SSS (sick sinus syndrome) ([place]) 10/05/2007    Past Surgical History:  Procedure Laterality Date  . CARDIAC CATHETERIZATION    . COLONOSCOPY    . CORONARY STENT PLACEMENT     x9  . FRACTURE SURGERY     femur fx, /w ORIF into HIP- 2008  . JOINT REPLACEMENT    . NM MYOCAR PERF WALL MOTION  10/08/2010   mild apical anterior ischemia  . PACEMAKER INSERTION  10/05/2007   Medtronic Adapta  . TOTAL HIP ARTHROPLASTY     planned for 08/27/2011 Left  . TOTAL HIP ARTHROPLASTY  08/27/2011   Procedure: TOTAL HIP ARTHROPLASTY;  Surgeon: Kerin Salen, MD;  Location: [facility];  Service: Orthopedics;  Laterality: Right;  . US ECHOCARDIOGRAPHY  08/23/2011   EF 50%,LA mod. dilated,mild to mod. mitral annular ca+,mod. MR,mild to mod TR,mild to mod. PH,trace AI.    OB History    No data available       Home Medications    Prior to Admission medications   Medication Sig Start Date End Date Taking? Authorizing Provider  acetaminophen (TYLENOL) 500 MG tablet Take 1 tablet (500 mg total) by mouth at bedtime. 04/26/16  Yes Danford, Valetta Fuller D, NP  amiodarone (PACERONE) 100 MG tablet Take 1 tablet (100 mg total) by mouth daily. 08/18/16  Yes Croitoru, Mihai, MD  Biotin 1000 MCG tablet Take 1 tablet (1 mg total) by mouth daily. 04/26/16  Yes Danford, Valetta Fuller D, NP  Calcium Carbonate-Vitamin D (CALCIUM 600+D) 600-400 MG-UNIT tablet Take 1 tablet by mouth daily. 04/26/16  Yes Danford, Katy D, NP  carboxymethylcellulose 1 % ophthalmic solution Apply 1 drop to eye 3 (three) times daily. Patient taking  differently: Apply 1 drop to eye 3 (three) times daily as needed (dry eyes).  04/26/16  Yes Danford, Valetta Fuller D, NP  clopidogrel (PLAVIX) 75 MG tablet TAKE 1 TABLET (75 MG TOTAL) BY MOUTH DAILY. 08/06/16  Yes Croitoru, Mihai, MD  cycloSPORINE (RESTASIS) 0.05 % ophthalmic emulsion Place 1 drop into both eyes 2 (two) times daily as needed (dryness).    Yes [provider]  feeding supplement (BOOST HIGH PROTEIN) LIQD Take 1 Container by mouth See admin instructions. Drink 1 everyday and another as needed for nutrition supplement.   Yes [provider]  furosemide (LASIX) 80 MG tablet TAKE 1 TABLET BY MOUTH DAILY EXCEPT FOR TUESDAYS AND FRIDAYS, YOU TAKE 1 TABLET BY MOUTH TWICE DAY 10/13/16  Yes Barrett, Rhonda G, PA-C  hydrOXYzine (ATARAX/VISTARIL) 10 MG tablet Take 0.5 tablets (5 mg total) by mouth as directed. 1/2 tab in morning and afternoon.  Full tablet at night. 10/20/16  Yes Danford,  Berna Spare, NP  isosorbide mononitrate (IMDUR) 30 MG 24 hr tablet Take 1 tablet (30 mg total) by mouth daily. 06/24/16 10/29/2016 Yes Croitoru, Mihai, MD  levothyroxine (SYNTHROID, LEVOTHROID) 112 MCG tablet Take 1 tablet (112 mcg total) by mouth daily before breakfast. 09/08/16  Yes Danford, Valetta Fuller D, NP  loratadine (CLARITIN) 10 MG tablet Take 10 mg by mouth daily.    Yes [provider]  nitroGLYCERIN (NITROSTAT) 0.4 MG SL tablet Place 1 tablet (0.4 mg total) under the tongue every 5 (five) minutes as needed for chest pain (For a total of 3 tablets, if chest pain persist to call 911). 10/15/14  Yes Eubanks, Carlos American, NP  ranitidine (ZANTAC) 300 MG capsule Take 1 capsule (300 mg total) by mouth every evening. 04/26/16  Yes Danford, Valetta Fuller D, NP  rOPINIRole (REQUIP) 0.25 MG tablet Take 2 tablets (0.5 mg total) by mouth at bedtime. 08/05/16  Yes Danford, Berna Spare, NP    Family History Family History  Problem Relation Age of Onset  . Emphysema Father   . Heart disease Mother   . Cancer Brother   . Cancer Brother     . Cancer Sister   . Cancer Sister   . Diabetes Daughter   . Atrial fibrillation Daughter   . Atrial fibrillation Daughter   . Stroke Daughter   . Heart Problems Son     Social History Social History  Substance Use Topics  . Smoking status: Former Smoker    Types: Cigarettes  . Smokeless tobacco: Never Used     Comment: quit 40 yrs ago- was smoking 2 packs a day at the most  . Alcohol use No     Allergies   Nubain [nalbuphine hcl]; Pineapple; Iodine; Mirtazapine; and Statins   Review of Systems Review of Systems  Constitutional: Positive for activity change. Negative for appetite change, chills, diaphoresis, fatigue and fever.  HENT: Negative for congestion, sore throat and trouble swallowing.   Eyes: Negative for photophobia, pain and visual disturbance.  Respiratory: Positive for shortness of breath. Negative for cough, chest tightness and wheezing.   Cardiovascular: Positive for palpitations and leg swelling. Negative for chest pain.  Gastrointestinal: Negative for abdominal distention, abdominal pain, blood in stool, constipation, diarrhea, nausea and vomiting.  Genitourinary: Positive for frequency (Decreased frequency). Negative for difficulty urinating and urgency.  Musculoskeletal: Positive for arthralgias, back pain and gait problem.  Skin: Positive for color change, pallor, rash and wound.       All of the distal lower extremities. Bruising of the right 1st through 3rd digit secondary to slipping while opening a bottle of boost.  Neurological: Negative for dizziness, syncope, weakness, light-headedness and headaches.  Psychiatric/Behavioral: Positive for confusion. The patient is not nervous/anxious.   All other systems reviewed and are negative.    Physical Exam Updated Vital Signs BP 93/61   Pulse (!) 101   Temp 97.8 F (36.6 C) (Oral)   Resp 19   SpO2 100%   Physical Exam  Constitutional: She is oriented to person, place, and time. No distress.   Underweight female  HENT:  Head: Normocephalic and atraumatic.  Mouth/Throat: No oropharyngeal exudate.  Eyes: EOM are normal.  Neck: Normal range of motion. Neck supple. JVD present. No tracheal deviation present.  Cardiovascular: Normal rate.  An irregularly irregular rhythm present. Exam reveals distant heart sounds and decreased pulses.   No murmur heard. Pulmonary/Chest: Effort normal. No stridor. No respiratory distress. She has decreased breath sounds in the right  lower field and the left lower field. She has no wheezes.  Abdominal: Soft. Bowel sounds are normal. She exhibits no distension. There is no tenderness. There is no guarding. A hernia is present.  Musculoskeletal: She exhibits edema (bilateral lower extremities ) and tenderness. She exhibits no deformity.  Neurological: She is alert and oriented to person, place, and time.  Skin: Skin is warm and dry. Capillary refill takes less than 2 seconds. She is not diaphoretic. There is erythema (Bilateral lower distal extremities ).  Psychiatric: She has a normal mood and affect. Her behavior is normal. Thought content normal.  Nursing note and vitals reviewed.    ED Treatments / Results  Labs (all labs ordered are listed, but only abnormal results are displayed) Labs Reviewed  CBC WITH DIFFERENTIAL/PLATELET - Abnormal; Notable for the following:       Result Value   RBC 3.81 (*)    MCV 106.8 (*)    RDW 16.9 (*)    Platelets 122 (*)    All other components within normal limits  COMPREHENSIVE METABOLIC PANEL - Abnormal; Notable for the following:    Chloride 95 (*)    CO2 35 (*)    BUN 45 (*)    Creatinine, Ser 1.94 (*)    Total Protein 6.2 (*)    Albumin 3.1 (*)    GFR calc non Af Amer 21 (*)    GFR calc Af Amer 24 (*)    All other components within normal limits  BRAIN NATRIURETIC PEPTIDE - Abnormal; Notable for the following:    B Natriuretic Peptide 1,697.4 (*)    All other components within normal limits   URINALYSIS, ROUTINE W REFLEX MICROSCOPIC - Abnormal; Notable for the following:    APPearance HAZY (*)    Hgb urine dipstick MODERATE (*)    Protein, ur 30 (*)    Nitrite POSITIVE (*)    Leukocytes, UA LARGE (*)    Bacteria, UA MANY (*)    All other components within normal limits  MAGNESIUM  BASIC METABOLIC PANEL  I-STAT TROPONIN, ED  I-STAT CG4 LACTIC ACID, ED  I-STAT CG4 LACTIC ACID, ED    EKG  EKG Interpretation  Date/Time:  Thursday October 21 2016 16:27:51 EDT Ventricular Rate:  94 PR Interval:    QRS Duration: 121 QT Interval:  377 QTC Calculation: 472 R Axis:   -23 Text Interpretation:  Age not entered, assumed to be  83 years old for purpose of ECG interpretation Atrial fibrillation Left bundle branch block No significant change since last tracing Confirmed by Wandra Arthurs [phone]) on 11/02/2016 4:36:07 PM       Radiology Dg Chest 2 View  Result Date: 10/27/2016 CLINICAL DATA:  Four pound increase in weight over the past 2 days with swelling of the ankles and pitting edema. Dyspnea. EXAM: CHEST  2 VIEW COMPARISON:  09/25/2016 FINDINGS: New bilateral small pleural effusions are noted with pulmonary vascular redistribution and stable cardiomegaly. Likely bibasilar compressive atelectasis secondary to the pleural effusions. Aortic atherosclerosis is noted. Left-sided pacemaker apparatus with right atrial and right ventricular leads are present. No acute nor suspicious osseous abnormality. The bones are demineralized and there is stable midthoracic kyphosis. IMPRESSION: Cardiomegaly with aortic atherosclerosis. Interval development of CHF with small bilateral layering effusions. Electronically Signed   By: Ashley Royalty M.D.   On: 10/19/2016 17:48    Procedures Procedures (including critical care time)  Medications Ordered in ED Medications  sodium chloride flush (NS) 0.9 %  injection 3 mL (not administered)  sodium chloride flush (NS) 0.9 % injection 3 mL (not administered)   0.9 %  sodium chloride infusion (not administered)  acetaminophen (TYLENOL) tablet 650 mg (not administered)  ondansetron (ZOFRAN) injection 4 mg (not administered)  heparin injection 5,000 Units (not administered)  furosemide (LASIX) injection 60 mg (not administered)  acetaminophen (TYLENOL) tablet 500 mg (not administered)  amiodarone (PACERONE) tablet 100 mg (not administered)  Calcium Carbonate-Vitamin D 600-400 MG-UNIT 1 tablet (not administered)  clopidogrel (PLAVIX) tablet 75 mg (not administered)  carboxymethylcellulose 1 % ophthalmic solution 1 drop (not administered)  cycloSPORINE (RESTASIS) 0.05 % ophthalmic emulsion 1 drop (not administered)  feeding supplement (BOOST HIGH PROTEIN) liquid 237 mL (not administered)  famotidine (PEPCID) tablet 40 mg (not administered)  rOPINIRole (REQUIP) tablet 0.5 mg (not administered)  loratadine (CLARITIN) tablet 10 mg (not administered)  levothyroxine (SYNTHROID, LEVOTHROID) tablet 112 mcg (not administered)  hydrOXYzine (ATARAX/VISTARIL) tablet 5 mg (not administered)  clindamycin (CLEOCIN) IVPB 600 mg (0 mg Intravenous Stopped 11/02/2016 2101)  furosemide (LASIX) injection 40 mg (40 mg Intravenous Given 10/26/2016 2010)     Initial Impression / Assessment and Plan / ED Course  I have reviewed the triage vital signs and the nursing notes.  Pertinent labs & imaging results that were available during my care of the patient were reviewed by me and considered in my medical decision making (see chart for details).    Assessment: Tonetta Napoles is a 82 year old female who presented with acute onset shortness of breath this morning. Given her presentation, her history, and medication use the patient is most likely suffering from congestive heart failure. Also included in the differential should be MI, pulmonary embolism, pneumonia, and asthma exacerbation but given her evaluation thus far these are less likely.   Plan: BNP, CBC, CMP, troponin, UA  pending Chest x-ray pending EKG unremarkable from baseline showing left bundle branch block and atrial fibrillation  BNP 1700 Will admit to hospitalist    Final Clinical Impressions(s) / ED Diagnoses   Final diagnoses:  Other congestive heart failure [facility])    New Prescriptions New Prescriptions   No medications on file     Kathi Ludwig, MD 10/18/2016 2148    Drenda Freeze, MD 10/22/16 [phone]

## 2016-10-21 NOTE — ED Notes (Signed)
Yellow band and socks applied to pt.   

## 2016-10-21 NOTE — ED Notes (Signed)
Dr. At bedside 

## 2016-10-22 DIAGNOSIS — I481 Persistent atrial fibrillation: Secondary | ICD-10-CM

## 2016-10-22 DIAGNOSIS — I5043 Acute on chronic combined systolic (congestive) and diastolic (congestive) heart failure: Secondary | ICD-10-CM

## 2016-10-22 DIAGNOSIS — G8929 Other chronic pain: Secondary | ICD-10-CM

## 2016-10-22 DIAGNOSIS — M25511 Pain in right shoulder: Secondary | ICD-10-CM

## 2016-10-22 DIAGNOSIS — J9601 Acute respiratory failure with hypoxia: Secondary | ICD-10-CM

## 2016-10-22 DIAGNOSIS — N170 Acute kidney failure with tubular necrosis: Secondary | ICD-10-CM

## 2016-10-22 DIAGNOSIS — N183 Chronic kidney disease, stage 3 (moderate): Secondary | ICD-10-CM

## 2016-10-22 DIAGNOSIS — I255 Ischemic cardiomyopathy: Secondary | ICD-10-CM

## 2016-10-22 LAB — BASIC METABOLIC PANEL
ANION GAP: 13 (ref 5–15)
BUN: 46 mg/dL — ABNORMAL HIGH (ref 6–20)
CALCIUM: 8.9 mg/dL (ref 8.9–10.3)
CO2: 32 mmol/L (ref 22–32)
Chloride: 94 mmol/L — ABNORMAL LOW (ref 101–111)
Creatinine, Ser: 2.12 mg/dL — ABNORMAL HIGH (ref 0.44–1.00)
GFR, EST AFRICAN AMERICAN: 21 mL/min — AB (ref >60)
GFR, EST NON AFRICAN AMERICAN: 18 mL/min — AB (ref >60)
Glucose, Bld: 114 mg/dL — ABNORMAL HIGH (ref 65–99)
Potassium: 4.7 mmol/L (ref 3.5–5.1)
Sodium: 139 mmol/L (ref 135–145)

## 2016-10-22 MED ORDER — FUROSEMIDE 10 MG/ML IJ SOLN
80.0000 mg | Freq: Three times a day (TID) | INTRAMUSCULAR | Status: DC
  Administered 2016-10-22 – 2016-10-24 (×8): 80 mg via INTRAVENOUS
  Filled 2016-10-22 (×8): qty 8

## 2016-10-22 MED ORDER — FAMOTIDINE 20 MG PO TABS
20.0000 mg | ORAL_TABLET | Freq: Every day | ORAL | Status: DC
  Administered 2016-10-23 – 2016-10-24 (×2): 20 mg via ORAL
  Filled 2016-10-22 (×2): qty 1

## 2016-10-22 MED ORDER — LIDOCAINE 5 % EX PTCH
1.0000 | MEDICATED_PATCH | CUTANEOUS | Status: DC
  Administered 2016-10-22 – 2016-10-25 (×4): 1 via TRANSDERMAL
  Filled 2016-10-22 (×4): qty 1

## 2016-10-22 NOTE — Progress Notes (Signed)
Patient called out to nursing staff complaining of shortness of breath that is relieved when she sits upright on the side of the bed. She states it comes on suddenly and this also occurs at home. Patient states that she sleep in a chair part of the time at home to help with breathing. Oxygen saturation >92 on 2liters.

## 2016-10-22 NOTE — Progress Notes (Signed)
Patient very restless tonight. Did not sleep much at all, maybe 2 hours. Patient was very uncomfortable. She sleeps in a recliner at home and could not quite get comfortable. We transferred back and forth from the chair to bed several times

## 2016-10-22 NOTE — Progress Notes (Addendum)
PROGRESS NOTE                                                                                                                                                                                                             Patient Demographics:    [name], is a 83 y.o. female, DOB - 1917-11-03, QQV:[id number]  Admit date - 10/15/2016   Admitting Physician Etta Quill, DO  Outpatient Primary MD for the patient is Danford, Berna Spare, NP  LOS - 1  Outpatient Specialists:[facility]  Chief Complaint  Patient presents with  . Shortness of Breath       Brief Narrative   83 year old female with history of ischemic cardiomyopathy, EF of 25-30%, CK D stage III, coronary artery disease who was hospitalized from 7/14-7/16 with dehydration overdiuresis. Her metolazone was stopped and Lasix dose reduced (discharge weight was 95 pounds with creatinine 1.6). She was then seen in the office on 8/1 and since her weight was up a few pounds her Lasix dose was increased.[name] She was seen in the office yesterday and complained of shortness of breath with increased leg swelling and weight gain of 5 pounds in 7 days. Her weight increased to 111 pounds.  Patient was sent home but returned to the ED with progressive shortness of breath, leg swellings and orthopnea.  In the ED she was found to be in acute CHF. Given 40 mg IV Lasix. Also received her dose of IV clindamycin with concern for bilateral lower leg cellulitis. Creatinine of 1.9. Admitted to telemetry.     Subjective:    Patient still short of breath and unable to lie down flat. Denies any chest pain but complains of right shoulder pain which is chronic.  Assessment  & Plan :    Principal Problem:   Acute on chronic combined systolic and diastolic CHF (congestive heart failure) ([place]) Has gained over 15 pounds from her baseline. I will  place her on IV Lasix 80 mg every 8 hours for  aggressive diuresis. O2 via nasal canula. Strict I/O and daily weight. consult cardiology. Patient insists on having regular diet (switched diet with fluid initiation)  Active Problems:    Persistent atrial fibrillation (HCC)  On Plavix. Rate controlled on the monitor. Continue amiodarone.  Acute on chronic kidney disease stage III Suspect cardiorenal syndrome. Monitor with aggressive  diuresis. Cardiology consult.  Acute respiratory failure with hypoxia (HCC) Secondary to decompensated CHF. Diuresis as above. O2 via nasal cannula.  Right shoulder pain Chronic. Tylenol when necessary and apply Lidoderm patch.  Plan discussed with daughter. Patient followed by palliative care at home. Daughter planning to set up home hospice upon discharge.   Code Status : DO NOT RESUSCITATE  Family Communication  : Daughter at bedside  Disposition Plan  : Home with hospice once stable  Barriers For Discharge : Active symptoms  Consults  :  Cardiology  Procedures  : None  DVT Prophylaxis  :  Lovenox -  Lab Results  Component Value Date   PLT 122 (L) 10/24/2016    Antibiotics  :    Anti-infectives    Start     Dose/Rate Route Frequency Ordered Stop   10/20/2016 1830  clindamycin (CLEOCIN) IVPB 600 mg     600 mg 100 mL/hr over 30 Minutes Intravenous  Once 10/17/2016 1829 11/11/2016 2101        Objective:   Vitals:   11/02/2016 2214 10/22/16 0046 10/22/16 0333 10/22/16 0840  BP:   109/62 100/63  Pulse:    83  Resp:   (!) 21 20  Temp:   98 F (36.7 C) 98.4 F (36.9 C)  TempSrc:   Oral Oral  SpO2:   91% 92%  Weight: 52.1 kg (114 lb 13.8 oz) 50.8 kg (112 lb)    Height: 5\' 3"  (1.6 m)       Wt Readings from Last 3 Encounters:  10/22/16 50.8 kg (112 lb)  10/20/16 51.1 kg (112 lb 12 oz)  10/19/16 49.5 kg (109 lb 3.2 oz)     Intake/Output Summary (Last 24 hours) at 10/22/16 1156 Last data filed at 10/22/16 0841  Gross per 24 hour  Intake              240 ml  Output               100 ml  Net              140 ml     Physical Exam  Gen: Elderly female sitting up in chair in some distress HEENT: JVD +,  moist mucosa, supple neck Chest: Diminished breath sounds over right lung base with scattered rhonchi bilaterally CVS: S1 and S2 irregular irregular, no murmurs rubs or gallop GI: soft, mild abdominal distention, nontender, bowel sounds present Musculoskeletal: warm, 3+ edema bilaterally, limited mobility of right shoulder due to pain     Data Review:    CBC  Recent Labs Lab 10/29/2016 1709  WBC 7.4  HGB 12.5  HCT 40.7  PLT 122*  MCV 106.8*  MCH 32.8  MCHC 30.7  RDW 16.9*  LYMPHSABS 1.3  MONOABS 0.7  EOSABS 0.1  BASOSABS 0.0    Chemistries   Recent Labs Lab 10/14/2016 1709 10/18/2016 2237 10/22/16 0317  NA 140  --  139  K 4.6  --  4.7  CL 95*  --  94*  CO2 35*  --  32  GLUCOSE 73  --  114*  BUN 45*  --  46*  CREATININE 1.94*  --  2.12*  CALCIUM 8.9  --  8.9  MG  --  2.2  --   AST 36  --   --   ALT 17  --   --   ALKPHOS 101  --   --   BILITOT 0.9  --   --    ------------------------------------------------------------------------------------------------------------------  No results for input(s): CHOL, HDL, LDLCALC, TRIG, CHOLHDL, LDLDIRECT in the last 72 hours.  Lab Results  Component Value Date   HGBA1C 5.5 04/26/2016   ------------------------------------------------------------------------------------------------------------------ No results for input(s): TSH, T4TOTAL, T3FREE, THYROIDAB in the last 72 hours.  Invalid input(s): FREET3 ------------------------------------------------------------------------------------------------------------------ No results for input(s): VITAMINB12, FOLATE, FERRITIN, TIBC, IRON, RETICCTPCT in the last 72 hours.  Coagulation profile No results for input(s): INR, PROTIME in the last 168 hours.  No results for input(s): DDIMER in the last 72 hours.  Cardiac Enzymes No results for input(s):  CKMB, TROPONINI, MYOGLOBIN in the last 168 hours.  Invalid input(s): CK ------------------------------------------------------------------------------------------------------------------    Component Value Date/Time   BNP 1,697.4 (H) 10/14/2016 1709    Inpatient Medications  Scheduled Meds: . acetaminophen  500 mg Oral QHS  . amiodarone  100 mg Oral Daily  . calcium-vitamin D  1 tablet Oral Daily  . clopidogrel  75 mg Oral Daily  . famotidine  40 mg Oral Daily  . feeding supplement (ENSURE ENLIVE)  237 mL Oral Daily  . furosemide  80 mg Intravenous Q8H  . heparin  5,000 Units Subcutaneous Q8H  . hydrOXYzine  5 mg Oral Daily   And  . hydrOXYzine  5 mg Oral Daily   And  . hydrOXYzine  10 mg Oral QHS  . levothyroxine  112 mcg Oral QAC breakfast  . lidocaine  1 patch Transdermal Q24H  . loratadine  10 mg Oral Daily  . rOPINIRole  0.5 mg Oral QHS  . sodium chloride flush  3 mL Intravenous Q12H   Continuous Infusions: . sodium chloride     PRN Meds:.sodium chloride, acetaminophen, cycloSPORINE, ondansetron (ZOFRAN) IV, polyvinyl alcohol, sodium chloride flush  Micro Results No results found for this or any previous visit (from the past 240 hour(s)).  Radiology Reports Dg Chest 2 View  Result Date: 10/15/2016 CLINICAL DATA:  Four pound increase in weight over the past 2 days with swelling of the ankles and pitting edema. Dyspnea. EXAM: CHEST  2 VIEW COMPARISON:  09/25/2016 FINDINGS: New bilateral small pleural effusions are noted with pulmonary vascular redistribution and stable cardiomegaly. Likely bibasilar compressive atelectasis secondary to the pleural effusions. Aortic atherosclerosis is noted. Left-sided pacemaker apparatus with right atrial and right ventricular leads are present. No acute nor suspicious osseous abnormality. The bones are demineralized and there is stable midthoracic kyphosis. IMPRESSION: Cardiomegaly with aortic atherosclerosis. Interval development of CHF  with small bilateral layering effusions. Electronically Signed   By: Ashley Royalty M.D.   On: 10/26/2016 17:48   Dg Chest 2 View  Result Date: 09/25/2016 CLINICAL DATA:  Weakness today EXAM: CHEST  2 VIEW COMPARISON:  06/13/2016 FINDINGS: The heart is markedly enlarged. Pulmonary vascularity is within normal limits. Left subclavian double lead pacemaker device and leads are stable and intact. Small pleural effusions. No pneumothorax. No evidence of consolidation or lung mass. Thoracic spine is osteopenia. There is kyphosis. IMPRESSION: Cardiomegaly without edema.  Small pleural effusions. Electronically Signed   By: Marybelle Killings M.D.   On: 09/25/2016 13:08   Dg Tibia/fibula Right  Result Date: 09/25/2016 CLINICAL DATA:  Right lower extremity inflammation, swelling, pain EXAM: RIGHT TIBIA AND FIBULA - 2 VIEW COMPARISON:  None. FINDINGS: No acute bony abnormality. Specifically, no fracture, subluxation, or dislocation. Soft tissues are intact. Mild degenerative changes in the right ankle. IMPRESSION: No acute bony abnormality. Electronically Signed   By: Rolm Baptise M.D.   On: 09/25/2016 15:06    Time Spent  in minutes  35   Louellen Molder M.D on 10/22/2016 at 11:56 AM  Between 7am to 7pm - Pager - [phone]  After 7pm go to [email or web address] - password [facility]  [facility] -  Office  [phone]

## 2016-10-23 DIAGNOSIS — N179 Acute kidney failure, unspecified: Secondary | ICD-10-CM

## 2016-10-23 DIAGNOSIS — F419 Anxiety disorder, unspecified: Secondary | ICD-10-CM

## 2016-10-23 LAB — BASIC METABOLIC PANEL
Anion gap: 15 (ref 5–15)
BUN: 52 mg/dL — ABNORMAL HIGH (ref 6–20)
CO2: 27 mmol/L (ref 22–32)
CREATININE: 2.39 mg/dL — AB (ref 0.44–1.00)
Calcium: 9 mg/dL (ref 8.9–10.3)
Chloride: 95 mmol/L — ABNORMAL LOW (ref 101–111)
GFR, EST AFRICAN AMERICAN: 18 mL/min — AB (ref >60)
GFR, EST NON AFRICAN AMERICAN: 16 mL/min — AB (ref >60)
GLUCOSE: 84 mg/dL (ref 65–99)
Potassium: 5.1 mmol/L (ref 3.5–5.1)
Sodium: 137 mmol/L (ref 135–145)

## 2016-10-23 MED ORDER — MORPHINE SULFATE (PF) 2 MG/ML IV SOLN: 2.0000 mg | Freq: Once | INTRAVENOUS | Status: DC

## 2016-10-23 MED ORDER — FENTANYL CITRATE (PF) 100 MCG/2ML IJ SOLN
25.0000 ug | INTRAMUSCULAR | Status: DC | PRN
  Administered 2016-10-25: 25 ug via INTRAVENOUS
  Filled 2016-10-23 (×2): qty 2

## 2016-10-23 MED ORDER — ALPRAZOLAM 0.25 MG PO TABS
0.2500 mg | ORAL_TABLET | Freq: Two times a day (BID) | ORAL | Status: DC
  Administered 2016-10-23 – 2016-10-24 (×3): 0.25 mg via ORAL
  Filled 2016-10-23 (×4): qty 1

## 2016-10-23 NOTE — Consult Note (Signed)
Cardiology Consultation:   Patient ID: [name]; [id number]; 01-07-1918   Admit date: 10/30/2016 Date of Consult: 10/23/2016  Primary Care Provider: Esaw Grandchild, NP Primary Cardiologist: [facility]    Patient Profile:   [name] is a 83 y.o. female with a hx of Systolic heart failure, COPD, coronary disease status multiple stents, persistent atrial fibrillation on amiodarone, TIA, sick sinus syndrome status post Medtronic pacemaker who is being seen today for the evaluation of heart failure at the request of Nishant Dhungel.  History of Present Illness:   [name] 83 year old lady who presented to the emergency room yesterday with shortness of breath and weight gain. She has peripheral edema and a weight gain of 5 pounds in the last 7 days. She was given 40 mg of IV Lasix, and has since been attempting diuresis. Creatinine on admission was 1.94, which has risen to 2.39.  Past Medical History:  Diagnosis Date  . Abdominal aortic aneurysm ([place])   . Anemia    hx  . Asthma    hx  . Atrial fibrillation ([place])   . Blood transfusion   . Blood transfusion without reported diagnosis   . CAD (coronary artery disease)   . CHF (congestive heart failure) ([place])   . COPD (chronic obstructive pulmonary disease) ([place])   . GERD (gastroesophageal reflux disease)   . GI bleed 2007  . H/O hiatal hernia   . Heart attack ([place])   . Heart disease   . History of right hip replacement 2013  . Hyperlipidemia   . Hypertension   . Hypothyroidism   . Myocardial infarction ([place])   . Osteoporosis   . Pacemaker   . Renal failure, chronic   . SSS (sick sinus syndrome) ([place]) 10/05/2007   Medtronic Adapta  . Stroke ([place])   . TIA (transient ischemic attack)   . UTI (lower urinary tract infection)    hx    Past Surgical History:  Procedure Laterality Date  . CARDIAC CATHETERIZATION    . COLONOSCOPY    . CORONARY STENT PLACEMENT     x9  . FRACTURE SURGERY     femur fx, /w ORIF into  HIP- 2008  . JOINT REPLACEMENT    . NM MYOCAR PERF WALL MOTION  10/08/2010   mild apical anterior ischemia  . PACEMAKER INSERTION  10/05/2007   Medtronic Adapta  . TOTAL HIP ARTHROPLASTY     planned for 08/27/2011 Left  . TOTAL HIP ARTHROPLASTY  08/27/2011   Procedure: TOTAL HIP ARTHROPLASTY;  Surgeon: Kerin Salen, MD;  Location: [facility];  Service: Orthopedics;  Laterality: Right;  . US ECHOCARDIOGRAPHY  08/23/2011   EF 50%,LA mod. dilated,mild to mod. mitral annular ca+,mod. MR,mild to mod TR,mild to mod. PH,trace AI.     Inpatient Medications: Scheduled Meds: . acetaminophen  500 mg Oral QHS  . ALPRAZolam  0.25 mg Oral BID  . amiodarone  100 mg Oral Daily  . calcium-vitamin D  1 tablet Oral Daily  . clopidogrel  75 mg Oral Daily  . famotidine  20 mg Oral Daily  . feeding supplement (ENSURE ENLIVE)  237 mL Oral Daily  . furosemide  80 mg Intravenous Q8H  . heparin  5,000 Units Subcutaneous Q8H  . hydrOXYzine  5 mg Oral Daily   And  . hydrOXYzine  5 mg Oral Daily   And  . hydrOXYzine  10 mg Oral QHS  . levothyroxine  112 mcg Oral QAC breakfast  . lidocaine  1 patch Transdermal Q24H  . loratadine  10 mg Oral Daily  . rOPINIRole  0.5 mg Oral QHS  . sodium chloride flush  3 mL Intravenous Q12H   Continuous Infusions: . sodium chloride     PRN Meds: sodium chloride, acetaminophen, cycloSPORINE, fentaNYL (SUBLIMAZE) injection, ondansetron (ZOFRAN) IV, polyvinyl alcohol, sodium chloride flush  Allergies:    Allergies  Allergen Reactions  . Nubain [Nalbuphine Hcl] Anaphylaxis  . Pineapple Other (See Comments)    Listed on [facility] 06/2015  . Iodine Rash  . Mirtazapine Other (See Comments)    "Weakness in legs"  . Statins Other (See Comments)    Leg weakness    Social History:   Social History   Social History  . Marital status: Widowed    Spouse name: N/A  . Number of children: 4  . Years of education: N/A   Occupational History  . Retired    Social History Main  Topics  . Smoking status: Former Smoker    Types: Cigarettes  . Smokeless tobacco: Never Used     Comment: quit 40 yrs ago- was smoking 2 packs a day at the most  . Alcohol use No  . Drug use: No  . Sexual activity: Yes    Birth control/ protection: Post-menopausal   Other Topics Concern  . Not on file   Social History Narrative   Diet:      Do you drink/ eat things with caffeine? yes      Marital status:    widow                           What year were you married ?  1940      Do you live in a house, apartment,assistred living, condo, trailer, etc.)?  Mobile home      Is it one or more stories? 1      How many persons live in your home ?  1      Do you have any pets in your home ?(please list)  1 cat      Current or past profession: factory worker      Do you exercise?  yes                           Type & how often: small amount      Do you have a living [place]?  no      Do you have a DNR form?     no                  If not, do you want to discuss one?       Do you have signed POA?HPOA forms?  no               If so, please bring to your        appointment          Family History:    Family History  Problem Relation Age of Onset  . Emphysema Father   . Heart disease Mother   . Cancer Brother   . Cancer Brother   . Cancer Sister   . Cancer Sister   . Diabetes Daughter   . Atrial fibrillation Daughter   . Atrial fibrillation Daughter   . Stroke Daughter   . Heart Problems Son      ROS:  Please see the  history of present illness.  ROS  All other ROS reviewed and negative.     Physical Exam/Data:   Vitals:   10/23/16 0100 10/23/16 0500 10/23/16 0858 10/23/16 1320  BP: (!) 92/57 100/77  96/78  Pulse: 97 (!) 114  69  Resp: 20 20  20   Temp:  97.9 F (36.6 C)  (!) 97.4 F (36.3 C)  TempSrc:  Oral  Oral  SpO2: 98% 96%    Weight:  112 lb 3.2 oz (50.9 kg) 113 lb 1.5 oz (51.3 kg)   Height:        Intake/Output Summary (Last 24 hours) at 10/23/16  1347 Last data filed at 10/22/16 1935  Gross per 24 hour  Intake              240 ml  Output              200 ml  Net               40 ml   Filed Weights   10/22/16 0046 10/23/16 0500 10/23/16 0858  Weight: 112 lb (50.8 kg) 112 lb 3.2 oz (50.9 kg) 113 lb 1.5 oz (51.3 kg)   Body mass index is 20.03 kg/m.  General:  Well nourished, well developed, in no acute distress HEENT: normal Lymph: no adenopathy Neck: no JVD Endocrine:  No thryomegaly Vascular: No carotid bruits; FA pulses 2+ bilaterally without bruits  Cardiac:  normal S1, S2; iRRR; no murmur Lungs:  clear to auscultation bilaterally, no wheezing, rhonchi or rales  Abd: soft, nontender, no hepatomegaly  Ext: no edema Musculoskeletal:  No deformities, 2+ LE edema Skin: warm and dry  Neuro:  CNs 2-12 intact, no focal abnormalities noted Psych:  Normal affect   EKG:  The EKG was personally reviewed and demonstrates:  Atrial fibrillation, LBBB Telemetry:  Telemetry was personally reviewed and demonstrates:  Atrial fibrillation  Relevant CV Studies: TTE - Left ventricle: False tendon in the mid LV cavity of no clinical   significance. The cavity size was normal. Systolic function was   severely reduced. The estimated ejection fraction was in the   range of 25% to 30%. There is akinesis of the entireanterior   myocardium. There is akinesis of the midanteroseptal myocardium.   There is akinesis of the basal-mid inferosepta and apical septall   myocardium. There is akinesis of the apicalinferior myocardium.   There is akinesis of the apical myocardium. - Aortic valve: Trileaflet; mildly thickened, mildly calcified   leaflets. - Mitral valve: Calcified annulus. There was moderate   regurgitation. Effective regurgitant orifice (PISA): 0.13 cm^2.   Regurgitant volume (PISA): 20 ml. - Left atrium: The atrium was severely dilated. - Right atrium: The atrium was moderately dilated. - Tricuspid valve: There was moderate  regurgitation. - Pulmonary arteries: PA peak pressure: 44 mm Hg (S).  Laboratory Data:  Chemistry Recent Labs Lab 11/08/2016 1709 10/22/16 0317 10/23/16 0539  NA 140 139 137  K 4.6 4.7 5.1  CL 95* 94* 95*  CO2 35* 32 27  GLUCOSE 73 114* 84  BUN 45* 46* 52*  CREATININE 1.94* 2.12* 2.39*  CALCIUM 8.9 8.9 9.0  GFRNONAA 21* 18* 16*  GFRAA 24* 21* 18*  ANIONGAP 10 13 15      Recent Labs Lab 10/28/2016 1709  PROT 6.2*  ALBUMIN 3.1*  AST 36  ALT 17  ALKPHOS 101  BILITOT 0.9   Hematology Recent Labs Lab 11/01/2016 1709  WBC 7.4  RBC 3.81*  HGB 12.5  HCT 40.7  MCV 106.8*  MCH 32.8  MCHC 30.7  RDW 16.9*  PLT 122*   Cardiac EnzymesNo results for input(s): TROPONINI in the last 168 hours.  Recent Labs Lab 10/18/2016 1728  TROPIPOC 0.07    BNP Recent Labs Lab 11/06/2016 1709  BNP 1,697.4*    DDimer No results for input(s): DDIMER in the last 168 hours.  Radiology/Studies:  Dg Chest 2 View  Result Date: 10/20/2016 CLINICAL DATA:  Four pound increase in weight over the past 2 days with swelling of the ankles and pitting edema. Dyspnea. EXAM: CHEST  2 VIEW COMPARISON:  09/25/2016 FINDINGS: New bilateral small pleural effusions are noted with pulmonary vascular redistribution and stable cardiomegaly. Likely bibasilar compressive atelectasis secondary to the pleural effusions. Aortic atherosclerosis is noted. Left-sided pacemaker apparatus with right atrial and right ventricular leads are present. No acute nor suspicious osseous abnormality. The bones are demineralized and there is stable midthoracic kyphosis. IMPRESSION: Cardiomegaly with aortic atherosclerosis. Interval development of CHF with small bilateral layering effusions. Electronically Signed   By: Ashley Royalty M.D.   On: 10/23/2016 17:48    Assessment and Plan:   1. Acute on chronic systolic and diastolic heart failure: Patient is up 50 pounds from her baseline. Has been diuresed with IV Lasix, though it has been  difficult to determine her urine output. Unfortunately, her creatinine has worsened. In discussion with both of her daughters, they feel that her outlook is guarded. They would prefer to change the goals of therapy to keep the patient comfortable. They would like to go home with hospice at this time. Would recommend consulting palliative care. 2. Persistent atrial fibrillation: On amiodarone with no anticoagulation due to bleeding risks. 3. Acute respiratory failure with hypoxia: Likely secondary to CHF. Diuresis as above. Currently on nasal cannula oxygen.   Signed, Myrene Bougher Meredith Leeds, MD  10/23/2016 1:47 PM

## 2016-10-23 NOTE — Care Management Note (Signed)
Case Management Note  Patient Details  Name: [name] MRN: [id number] Date of Birth: 03/20/1917  Subjective/Objective:                 Spoke to patient at bedside. She is from home w daughter. She states her bedroom has accessible bathroom. She uses [facility], denies needs for additional DME at this time. She states she has been in SNF three times, and wants to return home at DC. She verifies she is active w [facility].    Action/Plan:   Expected Discharge Date:  10/24/16               Expected Discharge Plan:  [place]  In-House Referral:     Discharge planning Services  CM Consult  Post Acute Care Choice:  Home Health Choice offered to:  Patient  DME Arranged:    DME Agency:     HH Arranged:    [place]:  [place] ([facility])  Status of Service:  In process, will continue to follow  If discussed at Long Length of Stay Meetings, dates discussed:    Additional Comments:  [name], RN 10/23/2016, 10:26 AM

## 2016-10-23 NOTE — Progress Notes (Addendum)
PROGRESS NOTE                                                                                                                                                                                                             Patient Demographics:    [name], is a 83 y.o. female, DOB - [birth year], ZJQ:[id number]  Admit date - 10/23/2016   Admitting Physician Etta Quill, DO  Outpatient Primary MD for the patient is Danford, Berna Spare, NP  LOS - 2  Outpatient Specialists:[facility]  Chief Complaint  Patient presents with  . Shortness of Breath       Brief Narrative   83 year old female with history of ischemic cardiomyopathy, EF of 25-30%, CK D stage III, coronary artery disease who was hospitalized from 7/14-7/16 with dehydration overdiuresis. Her metolazone was stopped and Lasix dose reduced (discharge weight was 95 pounds with creatinine 1.6). She was then seen in the office on 8/1 and since her weight was up a few pounds her Lasix dose was increased.[name] She was seen in the office yesterday and complained of shortness of breath with increased leg swelling and weight gain of 5 pounds in 7 days. Her weight increased to 111 pounds.  Patient was sent home but returned to the ED with progressive shortness of breath, leg swellings and orthopnea.  In the ED she was found to be in acute CHF. Given 40 mg IV Lasix. Also received her dose of IV clindamycin with concern for bilateral lower leg cellulitis. Creatinine of 1.9. Admitted to telemetry.     Subjective:   Breathing better since yesterday but still feels smothered when she is trying to light down. Denies any chest pain.  Assessment  & Plan :    Principal Problem:   Acute on chronic combined systolic and diastolic CHF (congestive heart failure) ([place]) Has gained over 15 pounds from her baseline. Started her on IV Lasix 80 mg every 8 hours, urine output not monitored adequately.  (Her JVD, lung sounds and leg swellings have improved however has gained another pound since yesterday). Continue fluid restriction. Worsened renal function. Continue current dose of Lasix and get cardiology input. Patient still insists on having regular diet. Continue fluid resection.  I will add low-dose scheduled Xanax. I think the anxiety of being unable to be is getting her panic attacks.  Active Problems:    Persistent atrial fibrillation (HCC)  On Plavix. Rate controlled on the monitor. Continue amiodarone.  Acute on chronic kidney disease stage III Suspect cardiorenal syndrome. Worsened renal function. Further recommendation per cardiology.  Acute respiratory failure with hypoxia (HCC) Secondary to decompensated CHF. Diuresis as above. O2 via nasal cannula.  Right shoulder pain Chronic. Better with Lidoderm patch. Continue Tylenol as needed.  Plan discussed with daughter. Patient followed by palliative care at home. Daughter planning to set up home hospice upon discharge.   Code Status : DO NOT RESUSCITATE  Family Communication  : Daughter at bedside  Disposition Plan  : Home with hospice symptoms improved  Barriers For Discharge : Active symptoms  Consults  :  Cardiology  Procedures  : None  DVT Prophylaxis  :  Lovenox -  Lab Results  Component Value Date   PLT 122 (L) 10/18/2016    Antibiotics  :    Anti-infectives    Start     Dose/Rate Route Frequency Ordered Stop   10/20/2016 1830  clindamycin (CLEOCIN) IVPB 600 mg     600 mg 100 mL/hr over 30 Minutes Intravenous  Once 10/22/2016 1829 11/10/2016 2101        Objective:   Vitals:   10/22/16 2015 10/23/16 0100 10/23/16 0500 10/23/16 0858  BP: (!) 94/57 (!) 92/57 100/77   Pulse: (!) 114 97 (!) 114   Resp: 18 20 20    Temp: 97.8 F (36.6 C)  97.9 F (36.6 C)   TempSrc: Oral  Oral   SpO2: 97% 98% 96%   Weight:   50.9 kg (112 lb 3.2 oz) 51.3 kg (113 lb 1.5 oz)  Height:        Wt Readings from Last  3 Encounters:  10/23/16 51.3 kg (113 lb 1.5 oz)  10/20/16 51.1 kg (112 lb 12 oz)  10/19/16 49.5 kg (109 lb 3.2 oz)     Intake/Output Summary (Last 24 hours) at 10/23/16 1141 Last data filed at 10/22/16 1935  Gross per 24 hour  Intake              240 ml  Output              200 ml  Net               40 ml     Physical ExamGen.:  Elderly female sitting up in bed not in distress, fatigue HEENT: JVD + (improved), moist mucosa, supple neck Chest: Improved breath sounds over bilateral lung, few crackles CVS: S1 and S2 irregular, no murmurs GI: Soft, nontender, abdominal distention improved, bowel sounds present Musculoskeletal: 2+ pitting edema bilaterally (improved)     Data Review:    CBC  Recent Labs Lab 10/14/2016 1709  WBC 7.4  HGB 12.5  HCT 40.7  PLT 122*  MCV 106.8*  MCH 32.8  MCHC 30.7  RDW 16.9*  LYMPHSABS 1.3  MONOABS 0.7  EOSABS 0.1  BASOSABS 0.0    Chemistries   Recent Labs Lab 11/12/2016 1709 10/31/2016 2237 10/22/16 0317 10/23/16 0539  NA 140  --  139 137  K 4.6  --  4.7 5.1  CL 95*  --  94* 95*  CO2 35*  --  32 27  GLUCOSE 73  --  114* 84  BUN 45*  --  46* 52*  CREATININE 1.94*  --  2.12* 2.39*  CALCIUM 8.9  --  8.9 9.0  MG  --  2.2  --   --  AST 36  --   --   --   ALT 17  --   --   --   ALKPHOS 101  --   --   --   BILITOT 0.9  --   --   --    ------------------------------------------------------------------------------------------------------------------ No results for input(s): CHOL, HDL, LDLCALC, TRIG, CHOLHDL, LDLDIRECT in the last 72 hours.  Lab Results  Component Value Date   HGBA1C 5.5 04/26/2016   ------------------------------------------------------------------------------------------------------------------ No results for input(s): TSH, T4TOTAL, T3FREE, THYROIDAB in the last 72 hours.  Invalid input(s):  FREET3 ------------------------------------------------------------------------------------------------------------------ No results for input(s): VITAMINB12, FOLATE, FERRITIN, TIBC, IRON, RETICCTPCT in the last 72 hours.  Coagulation profile No results for input(s): INR, PROTIME in the last 168 hours.  No results for input(s): DDIMER in the last 72 hours.  Cardiac Enzymes No results for input(s): CKMB, TROPONINI, MYOGLOBIN in the last 168 hours.  Invalid input(s): CK ------------------------------------------------------------------------------------------------------------------    Component Value Date/Time   BNP 1,697.4 (H) 10/13/2016 1709    Inpatient Medications  Scheduled Meds: . acetaminophen  500 mg Oral QHS  . amiodarone  100 mg Oral Daily  . calcium-vitamin D  1 tablet Oral Daily  . clopidogrel  75 mg Oral Daily  . famotidine  20 mg Oral Daily  . feeding supplement (ENSURE ENLIVE)  237 mL Oral Daily  . furosemide  80 mg Intravenous Q8H  . heparin  5,000 Units Subcutaneous Q8H  . hydrOXYzine  5 mg Oral Daily   And  . hydrOXYzine  5 mg Oral Daily   And  . hydrOXYzine  10 mg Oral QHS  . levothyroxine  112 mcg Oral QAC breakfast  . lidocaine  1 patch Transdermal Q24H  . loratadine  10 mg Oral Daily  . rOPINIRole  0.5 mg Oral QHS  . sodium chloride flush  3 mL Intravenous Q12H   Continuous Infusions: . sodium chloride     PRN Meds:.sodium chloride, acetaminophen, cycloSPORINE, fentaNYL (SUBLIMAZE) injection, ondansetron (ZOFRAN) IV, polyvinyl alcohol, sodium chloride flush  Micro Results No results found for this or any previous visit (from the past 240 hour(s)).  Radiology Reports Dg Chest 2 View  Result Date: 10/19/2016 CLINICAL DATA:  Four pound increase in weight over the past 2 days with swelling of the ankles and pitting edema. Dyspnea. EXAM: CHEST  2 VIEW COMPARISON:  09/25/2016 FINDINGS: New bilateral small pleural effusions are noted with pulmonary  vascular redistribution and stable cardiomegaly. Likely bibasilar compressive atelectasis secondary to the pleural effusions. Aortic atherosclerosis is noted. Left-sided pacemaker apparatus with right atrial and right ventricular leads are present. No acute nor suspicious osseous abnormality. The bones are demineralized and there is stable midthoracic kyphosis. IMPRESSION: Cardiomegaly with aortic atherosclerosis. Interval development of CHF with small bilateral layering effusions. Electronically Signed   By: Ashley Royalty M.D.   On: 11/06/2016 17:48   Dg Chest 2 View  Result Date: 09/25/2016 CLINICAL DATA:  Weakness today EXAM: CHEST  2 VIEW COMPARISON:  06/13/2016 FINDINGS: The heart is markedly enlarged. Pulmonary vascularity is within normal limits. Left subclavian double lead pacemaker device and leads are stable and intact. Small pleural effusions. No pneumothorax. No evidence of consolidation or lung mass. Thoracic spine is osteopenia. There is kyphosis. IMPRESSION: Cardiomegaly without edema.  Small pleural effusions. Electronically Signed   By: Marybelle Killings M.D.   On: 09/25/2016 13:08   Dg Tibia/fibula Right  Result Date: 09/25/2016 CLINICAL DATA:  Right lower extremity inflammation, swelling, pain EXAM: RIGHT TIBIA AND  FIBULA - 2 VIEW COMPARISON:  None. FINDINGS: No acute bony abnormality. Specifically, no fracture, subluxation, or dislocation. Soft tissues are intact. Mild degenerative changes in the right ankle. IMPRESSION: No acute bony abnormality. Electronically Signed   By: Rolm Baptise M.D.   On: 09/25/2016 15:06    Time Spent in minutes  35   Louellen Molder M.D on 10/23/2016 at 11:41 AM  Between 7am to 7pm - Pager - [phone]  After 7pm go to [email or web address] - password [facility]  [facility] -  Office  [phone]

## 2016-10-24 LAB — BASIC METABOLIC PANEL
Anion gap: 9 (ref 5–15)
BUN: 51 mg/dL — AB (ref 6–20)
CO2: 33 mmol/L — ABNORMAL HIGH (ref 22–32)
CREATININE: 2.52 mg/dL — AB (ref 0.44–1.00)
Calcium: 8.9 mg/dL (ref 8.9–10.3)
Chloride: 95 mmol/L — ABNORMAL LOW (ref 101–111)
GFR, EST AFRICAN AMERICAN: 17 mL/min — AB (ref >60)
GFR, EST NON AFRICAN AMERICAN: 15 mL/min — AB (ref >60)
Glucose, Bld: 92 mg/dL (ref 65–99)
Potassium: 4.4 mmol/L (ref 3.5–5.1)
Sodium: 137 mmol/L (ref 135–145)

## 2016-10-24 MED ORDER — HYDROCERIN EX CREA
TOPICAL_CREAM | Freq: Two times a day (BID) | CUTANEOUS | Status: DC
  Filled 2016-10-24 (×2): qty 113

## 2016-10-24 MED ORDER — MORPHINE SULFATE (CONCENTRATE) 10 MG/0.5ML PO SOLN
5.0000 mg | ORAL | Status: DC | PRN
  Administered 2016-10-24: 5 mg via ORAL
  Filled 2016-10-24: qty 0.5

## 2016-10-24 MED ORDER — HYDROCODONE-ACETAMINOPHEN 5-325 MG PO TABS
1.0000 | ORAL_TABLET | Freq: Once | ORAL | Status: AC
Start: 2016-10-24 — End: 2016-10-24
  Administered 2016-10-24: 1 via ORAL
  Filled 2016-10-24: qty 1

## 2016-10-24 NOTE — Progress Notes (Signed)
Pt is alert and oriented 2-3 family at bedside with no distress blood pressure 100/50 and decreased output throughout the day, plan to hold 2200 lasix and reassess need at 0000.

## 2016-10-24 NOTE — Progress Notes (Signed)
Pt is alert is currently with no distress, gave night timer medications with apple sauce, Daughter Drusilla Kanner for update and ask [place] to call if anything changes anytime of the night. Plan to be here in the morning for MD rounds.

## 2016-10-24 NOTE — Consult Note (Signed)
[place] Nurse wound consult note Reason for Consult: Bilateral lateral malleolar ulcerations, right has healed (recently), left with new onset of exquisite pain. ABI in February of this year yields falsely elevated reading with likely non compressable vessels. Wound type: Venous insufficiency Pressure Injury POA: N/A Measurement: Left lateral malleolus with 2.5cm x 2cm area of erythema with 0.4cm x 0.2cm opening of greater depth and yellow wound bed in center. Depth is unable to be assessed today as patient reports exquisite pain when any touch applied to wound. Right pretibial area with two areas of partial thickness tissue loss measuring 2cm x 2.5cm each, wounds are red, moist, but not exudative. Right lateral malleolus with area recently healed. Thin layer of epidermis covering. Wound bed: As described above Drainage (amount, consistency, odor) As described above Periwound:intact.  Hairless, mild edema. Dressing procedure/placement/frequency: Recent onset of exquisite pain in the chronic, non-healing ulceration on the left lateral malleolus could be an indication of infection. If you agree, further workup for osteomyelitis could be undertaken. In the meantime, I will provide Nursing with guidance for conservative care to the right LE using Eucerin cream and order twice daily saline moistened gauze to the left lateral malleolar wound. LEs are to be elevated while in bed. [place] nursing team will not follow, but will remain available to this patient, the nursing and medical teams.  Please re-consult if needed. Thanks, Maudie Flakes, MSN, RN, [place], Arther Abbott  Pager# [phone]

## 2016-10-24 NOTE — Progress Notes (Signed)
Pt c/o 10/10 shoulder and ankle pain.  Given morphine with no relief.  Pts family requesting Vicodin.  MD paged and order given.  Vicodin given with relief.  Pt resting comfortably with family at bedside, will continue to monitor.

## 2016-10-24 NOTE — Progress Notes (Signed)
PROGRESS NOTE                                                                                                                                                                                                             Patient Demographics:    [name], is a 83 y.o. female, DOB - April 18, 1917, PPJ:[id number]  Admit date - 10/30/2016   Admitting Physician Etta Quill, DO  Outpatient Primary MD for the patient is Danford, Berna Spare, NP  LOS - 3  Outpatient Specialists:[facility]  Chief Complaint  Patient presents with  . Shortness of Breath       Brief Narrative   83 year old female with history of ischemic cardiomyopathy, EF of 25-30%, CK D stage III, coronary artery disease who was hospitalized from 7/14-7/16 with dehydration overdiuresis. Her metolazone was stopped and Lasix dose reduced (discharge weight was 95 pounds with creatinine 1.6). She was then seen in the office on 8/1 and since her weight was up a few pounds her Lasix dose was increased.[name] She was seen in the office yesterday and complained of shortness of breath with increased leg swelling and weight gain of 5 pounds in 7 days. Her weight increased to 111 pounds.  Patient was sent home but returned to the ED with progressive shortness of breath, leg swellings and orthopnea.  In the ED she was found to be in acute CHF. Given 40 mg IV Lasix. Also received her dose of IV clindamycin with concern for bilateral lower leg cellulitis. Creatinine of 1.9. Admitted to telemetry.     Subjective:   Patient reports that she has been able to breathe much better today and slept well last night.  Assessment  & Plan :    Principal Problem:   Acute on chronic combined systolic and diastolic CHF (congestive heart failure) (HCC) Symptomatically improved but not sure how much she has diuresed. Will continue current dose of Lasix. Appreciate cardiology evaluation. Patient will  not benefit from further aggressive intervention. Daughter agrees with taking her home with home hospice. Will consult care management. Added liquid morphine when necessary for comfort. Continue Xanax for anxiety.  Active Problems:    Persistent atrial fibrillation (HCC)  On Plavix.  Continue amiodarone. Discontinue telemetry.  Acute on chronic kidney disease stage III Suspect cardiorenal syndrome. Renal function continues to worsen. Goal for comfort.  Acute respiratory failure with hypoxia (HCC) Secondary to decompensated CHF. Diuresis as above. O2 via nasal cannula.  Right shoulder pain Chronic. Better with Lidoderm patch. Continue Tylenol as needed.    Code Status : DO NOT RESUSCITATE  Family Communication  : Daughter at bedside  Disposition Plan  : Home with home hospice possibly tomorrow.  Barriers For Discharge : Active symptoms  Consults  :  Cardiology  Procedures  : None  DVT Prophylaxis  :  Lovenox -  Lab Results  Component Value Date   PLT 122 (L) 10/13/2016    Antibiotics  :    Anti-infectives    Start     Dose/Rate Route Frequency Ordered Stop   11/11/2016 1830  clindamycin (CLEOCIN) IVPB 600 mg     600 mg 100 mL/hr over 30 Minutes Intravenous  Once 10/14/2016 1829 10/20/2016 2101        Objective:   Vitals:   10/23/16 0858 10/23/16 1320 10/23/16 2011 10/24/16 0621  BP:  96/78 104/67 (!) 98/57  Pulse:  69 73 64  Resp:  20 20 18   Temp:  (!) 97.4 F (36.3 C) 98.2 F (36.8 C) 98.1 F (36.7 C)  TempSrc:  Oral Oral Oral  SpO2:   94% 94%  Weight: 51.3 kg (113 lb 1.5 oz)   51.1 kg (112 lb 9.6 oz)  Height:        Wt Readings from Last 3 Encounters:  10/24/16 51.1 kg (112 lb 9.6 oz)  10/20/16 51.1 kg (112 lb 12 oz)  10/19/16 49.5 kg (109 lb 3.2 oz)     Intake/Output Summary (Last 24 hours) at 10/24/16 1204 Last data filed at 10/24/16 0949  Gross per 24 hour  Intake              815 ml  Output                0 ml  Net              815 ml      Physical exam Gen.: Elderly female, not in distress HEENT: Improved JVD, moist mucosa, supple neck Chest: Improvement sounds over lung bases, no added sounds CVS: S1 and S2 irregular, no murmurs GI: Soft, nondistended, nontender Musculoskeletal: Warm, 1+ pitting edema bilaterally (improved)     Data Review:    CBC  Recent Labs Lab 10/13/2016 1709  WBC 7.4  HGB 12.5  HCT 40.7  PLT 122*  MCV 106.8*  MCH 32.8  MCHC 30.7  RDW 16.9*  LYMPHSABS 1.3  MONOABS 0.7  EOSABS 0.1  BASOSABS 0.0    Chemistries   Recent Labs Lab 10/20/2016 1709 10/24/2016 2237 10/22/16 0317 10/23/16 0539 10/24/16 0346  NA 140  --  139 137 137  K 4.6  --  4.7 5.1 4.4  CL 95*  --  94* 95* 95*  CO2 35*  --  32 27 33*  GLUCOSE 73  --  114* 84 92  BUN 45*  --  46* 52* 51*  CREATININE 1.94*  --  2.12* 2.39* 2.52*  CALCIUM 8.9  --  8.9 9.0 8.9  MG  --  2.2  --   --   --   AST 36  --   --   --   --   ALT 17  --   --   --   --   ALKPHOS 101  --   --   --   --   BILITOT 0.9  --   --   --   --    ------------------------------------------------------------------------------------------------------------------  No results for input(s): CHOL, HDL, LDLCALC, TRIG, CHOLHDL, LDLDIRECT in the last 72 hours.  Lab Results  Component Value Date   HGBA1C 5.5 04/26/2016   ------------------------------------------------------------------------------------------------------------------ No results for input(s): TSH, T4TOTAL, T3FREE, THYROIDAB in the last 72 hours.  Invalid input(s): FREET3 ------------------------------------------------------------------------------------------------------------------ No results for input(s): VITAMINB12, FOLATE, FERRITIN, TIBC, IRON, RETICCTPCT in the last 72 hours.  Coagulation profile No results for input(s): INR, PROTIME in the last 168 hours.  No results for input(s): DDIMER in the last 72 hours.  Cardiac Enzymes No results for input(s): CKMB, TROPONINI,  MYOGLOBIN in the last 168 hours.  Invalid input(s): CK ------------------------------------------------------------------------------------------------------------------    Component Value Date/Time   BNP 1,697.4 (H) 10/30/2016 1709    Inpatient Medications  Scheduled Meds: . acetaminophen  500 mg Oral QHS  . ALPRAZolam  0.25 mg Oral BID  . amiodarone  100 mg Oral Daily  . calcium-vitamin D  1 tablet Oral Daily  . clopidogrel  75 mg Oral Daily  . famotidine  20 mg Oral Daily  . feeding supplement (ENSURE ENLIVE)  237 mL Oral Daily  . furosemide  80 mg Intravenous Q8H  . heparin  5,000 Units Subcutaneous Q8H  . hydrOXYzine  5 mg Oral Daily   And  . hydrOXYzine  5 mg Oral Daily   And  . hydrOXYzine  10 mg Oral QHS  . levothyroxine  112 mcg Oral QAC breakfast  . lidocaine  1 patch Transdermal Q24H  . loratadine  10 mg Oral Daily  . rOPINIRole  0.5 mg Oral QHS  . sodium chloride flush  3 mL Intravenous Q12H   Continuous Infusions: . sodium chloride     PRN Meds:.sodium chloride, acetaminophen, cycloSPORINE, fentaNYL (SUBLIMAZE) injection, morphine CONCENTRATE, ondansetron (ZOFRAN) IV, polyvinyl alcohol, sodium chloride flush  Micro Results No results found for this or any previous visit (from the past 240 hour(s)).  Radiology Reports Dg Chest 2 View  Result Date: 10/15/2016 CLINICAL DATA:  Four pound increase in weight over the past 2 days with swelling of the ankles and pitting edema. Dyspnea. EXAM: CHEST  2 VIEW COMPARISON:  09/25/2016 FINDINGS: New bilateral small pleural effusions are noted with pulmonary vascular redistribution and stable cardiomegaly. Likely bibasilar compressive atelectasis secondary to the pleural effusions. Aortic atherosclerosis is noted. Left-sided pacemaker apparatus with right atrial and right ventricular leads are present. No acute nor suspicious osseous abnormality. The bones are demineralized and there is stable midthoracic kyphosis. IMPRESSION:  Cardiomegaly with aortic atherosclerosis. Interval development of CHF with small bilateral layering effusions. Electronically Signed   By: Ashley Royalty M.D.   On: 10/31/2016 17:48   Dg Chest 2 View  Result Date: 09/25/2016 CLINICAL DATA:  Weakness today EXAM: CHEST  2 VIEW COMPARISON:  06/13/2016 FINDINGS: The heart is markedly enlarged. Pulmonary vascularity is within normal limits. Left subclavian double lead pacemaker device and leads are stable and intact. Small pleural effusions. No pneumothorax. No evidence of consolidation or lung mass. Thoracic spine is osteopenia. There is kyphosis. IMPRESSION: Cardiomegaly without edema.  Small pleural effusions. Electronically Signed   By: Marybelle Killings M.D.   On: 09/25/2016 13:08   Dg Tibia/fibula Right  Result Date: 09/25/2016 CLINICAL DATA:  Right lower extremity inflammation, swelling, pain EXAM: RIGHT TIBIA AND FIBULA - 2 VIEW COMPARISON:  None. FINDINGS: No acute bony abnormality. Specifically, no fracture, subluxation, or dislocation. Soft tissues are intact. Mild degenerative changes in the right ankle. IMPRESSION: No acute bony abnormality. Electronically Signed   By: Lennette Bihari  [place] M.D.   On: 09/25/2016 15:06    Time Spent in minutes  25   Louellen Molder M.D on 10/24/2016 at 12:04 PM  Between 7am to 7pm - Pager - [phone]  After 7pm go to [email or web address] - password [facility]  [facility] -  Office  [phone]

## 2016-10-25 ENCOUNTER — Inpatient Hospital Stay (HOSPITAL_COMMUNITY): Payer: Medicare HMO

## 2016-10-25 DIAGNOSIS — Z66 Do not resuscitate: Secondary | ICD-10-CM

## 2016-10-25 DIAGNOSIS — Z515 Encounter for palliative care: Secondary | ICD-10-CM

## 2016-10-25 LAB — BASIC METABOLIC PANEL
Anion gap: 17 — ABNORMAL HIGH (ref 5–15)
BUN: 55 mg/dL — ABNORMAL HIGH (ref 6–20)
CHLORIDE: 95 mmol/L — AB (ref 101–111)
CO2: 28 mmol/L (ref 22–32)
Calcium: 9 mg/dL (ref 8.9–10.3)
Creatinine, Ser: 2.84 mg/dL — ABNORMAL HIGH (ref 0.44–1.00)
GFR calc non Af Amer: 13 mL/min — ABNORMAL LOW (ref >60)
GFR, EST AFRICAN AMERICAN: 15 mL/min — AB (ref >60)
Glucose, Bld: 76 mg/dL (ref 65–99)
POTASSIUM: 4.8 mmol/L (ref 3.5–5.1)
SODIUM: 140 mmol/L (ref 135–145)

## 2016-10-25 MED ORDER — SODIUM CHLORIDE 0.9 % IV SOLN
5.0000 mg/h | INTRAVENOUS | Status: DC
  Administered 2016-10-25: 2 mg/h via INTRAVENOUS
  Filled 2016-10-25: qty 10

## 2016-10-25 MED ORDER — IPRATROPIUM-ALBUTEROL 0.5-2.5 (3) MG/3ML IN SOLN: 1.5000 mL | RESPIRATORY_TRACT | Status: DC

## 2016-10-25 MED ORDER — GLYCOPYRROLATE 0.2 MG/ML IJ SOLN: 0.1000 mg | INTRAMUSCULAR | Status: DC | PRN

## 2016-10-25 MED ORDER — LORAZEPAM 2 MG/ML IJ SOLN
0.5000 mg | Freq: Once | INTRAMUSCULAR | Status: AC
  Administered 2016-10-25: 0.5 mg via INTRAVENOUS
  Filled 2016-10-25: qty 1

## 2016-10-25 MED ORDER — FENTANYL CITRATE (PF) 100 MCG/2ML IJ SOLN
12.5000 ug | INTRAMUSCULAR | Status: DC | PRN
  Administered 2016-10-25: 12.5 ug via INTRAVENOUS
  Filled 2016-10-25: qty 2

## 2016-10-25 MED ORDER — PROCHLORPERAZINE EDISYLATE 5 MG/ML IJ SOLN
5.0000 mg | INTRAMUSCULAR | Status: DC | PRN
  Filled 2016-10-25: qty 1

## 2016-10-25 MED ORDER — LORAZEPAM 2 MG/ML IJ SOLN
1.0000 mg | INTRAMUSCULAR | Status: DC | PRN
  Administered 2016-10-25: 1 mg via INTRAVENOUS
  Filled 2016-10-25: qty 1

## 2016-10-26 ENCOUNTER — Encounter: Payer: Self-pay | Admitting: *Deleted

## 2016-10-26 ENCOUNTER — Other Ambulatory Visit: Payer: Self-pay | Admitting: *Deleted

## 2016-10-26 NOTE — Patient Outreach (Signed)
[facility]) Care Management  10/26/2016  [name] [birth year] [id number]   Noted patient expired in hospital 8/13. Will close case per protocol.   Plan  Will send CMA, case closure message.   [name], RN, [place] Management Coordinator  [phone]- Mobile [phone]- Toll Free Main Office

## 2016-10-27 ENCOUNTER — Ambulatory Visit: Payer: Self-pay | Admitting: *Deleted

## 2016-11-04 ENCOUNTER — Ambulatory Visit: Payer: Medicare HMO | Admitting: Cardiovascular Disease

## 2016-11-13 NOTE — Progress Notes (Signed)
Wasted 240ml of remaining morphine drip bag with Samantha Crimes, RN into sink.

## 2016-11-13 NOTE — Progress Notes (Signed)
Night MD Rounding Pt hyper-restless and moaning, plan to cont on non rebreather o2 sats 98% gave ativan and Chest cxr ordered.

## 2016-11-13 NOTE — Progress Notes (Signed)
   11-14-2016 1435  Notifications  Bed Control notified that Post Mortem checklist is complete Yes  Bed Control notified body transferred Transported to morgue  Attending [place]  Attending Physician Notified Y  Attending Physician Name Dhungel, N  Will the above attending physician sign death certificate?  (non ED physician) Yes  Post Mortem Checklist  Date of Death 11/14/2016  Time of Death 34  Pronounced By Samantha Crimes, RN & Baird Cancer, RN  Next of kin notified Yes  Name of next of kin notified of death Torrie Mayers  Contact Person's Relationship to Patient Daughter  Contact Person's Phone Number [phone]  Family Communication Notes Family at bedside at time of death  Was the patient a No Code Blue or a Limited Code Blue? Yes  Did the patient die unattended? No  Patient restrained? Not applicable  Weight 38.1 kg (118 lb 13.3 oz)  Body preparation complete Y  [place] [facility]  Notification Date 2016-11-14  Notification Time [place] [facility] Service Number [phone] Murlean Caller  Is patient a potential donor? Y  Donation Type Tissue  Eye prep completed (n/a)  Autopsy  Autopsy requested by N/A  Patient Belongings/Medications Returned  Patient belongings from bedside/safe/pharmacy returned  Yes  Valuables returned to? Family at bedside  Specify valuables returned hearing aid  TO BE FILLED OUT BY BED CONTROL ONLY  FH - [place] Notified Y  [place] - Name of person who notified funeral home Margot  [place] - Date funeral home notified 2016/11/14  [place] - Time funeral home notified 1723  [place] - Name of person at funeral home with whom you spoke Mr. Prosperity  Is this a medical examiner's case? [place] home name/address/phone # [place] [phone]  Planned location of pickup [place]

## 2016-11-13 NOTE — Progress Notes (Signed)
Pt is moaning and restless, MD aware and ordered to give 12.5 of fentanyl  For pain and comfort. Pt daughter and caregiver called and updated on pt status.

## 2016-11-13 NOTE — Care Management Important Message (Signed)
Important Message  Patient Details  Name: [name] MRN: [id number] Date of Birth: 1917-06-07   Medicare Important Message Given:  Yes    [name] 10/29/2016, 12:53 PM

## 2016-11-13 NOTE — Progress Notes (Signed)
PROGRESS NOTE                                                                                                                                                                                                             Patient Demographics:    [name], is a 83 y.o. female, DOB - 1917-04-13, TIR:[id number]  Admit date - 10/20/2016   Admitting Physician Etta Quill, DO  Outpatient Primary MD for the patient is Danford, Berna Spare, NP  LOS - 4  Outpatient Specialists:[facility]  Chief Complaint  Patient presents with  . Shortness of Breath       Brief Narrative   83 year old female with history of ischemic cardiomyopathy, EF of 25-30%, CK D stage III, coronary artery disease who was hospitalized from 7/14-7/16 with dehydration overdiuresis. Her metolazone was stopped and Lasix dose reduced (discharge weight was 95 pounds with creatinine 1.6). She was then seen in the office on 8/1 and since her weight was up a few pounds her Lasix dose was increased.[name] She was seen in the office yesterday and complained of shortness of breath with increased leg swelling and weight gain of 5 pounds in 7 days. Her weight increased to 111 pounds.  Patient was sent home but returned to the ED with progressive shortness of breath, leg swellings and orthopnea.  In the ED she was found to be in acute CHF. Given 40 mg IV Lasix. Also received her dose of IV clindamycin with concern for bilateral lower leg cellulitis. Creatinine of 1.9.  Patient admitted to telemetry. Placed on aggressive IV Lasix for diuresis without improvement. Plan for discharge home with home hospice but overnight on 8/13 developed nausea with vomiting, hypoxia and restlessness .chest x-ray showing fluid overload. Placed on nonrebreather and transition to full comfort measures on morphine drip.     Subjective:   Patient was nauseous with episode of vomiting, hypoxic and  restlessness, O2 sat dropped to 77% on nasal cannula and improved on nonrebreather. Chest x-ray showed bilateral pleural effusion and lung opacities. Discussed with family and started on morphine drip for full comfort.  Assessment  & Plan :    Principal Problem:   Acute on chronic combined systolic and diastolic CHF (congestive heart failure) (HCC) Poor improvement. Now will for full comfort. Started on IV morphine drip. Ativan when necessary for anxiety  and sleep. Initial plan was for going home with home hospice but this seems unlikely. Transfer to medical floor.  Active Problems:    Persistent atrial fibrillation [facility])  Now will for comfort. Discontinue telemetry, Plavix and amiodarone.  Acute on chronic kidney disease stage III Suspect cardiorenal syndrome. Renal function continues to worsen. Goal for comfort.  Acute respiratory failure with hypoxia (HCC) Secondary to decompensated CHF. No improvement with diuresis. Now will for comfort. Continue NRB  Right shoulder pain Chronic. Now on morphine drip. Continue Lidoderm patch.  Palliative care consulted to assist with medications.  Code Status : DO NOT RESUSCITATE  Family Communication  : Daughter at bedside  Disposition Plan  : Suspect in-hospital death.  Barriers For Discharge : Active symptoms  Consults  :  Cardiology  Procedures  : None  DVT Prophylaxis  :  Lovenox -  Lab Results  Component Value Date   PLT 122 (L) 11/04/2016    Antibiotics  :    Anti-infectives    Start     Dose/Rate Route Frequency Ordered Stop   10/14/2016 1830  clindamycin (CLEOCIN) IVPB 600 mg     600 mg 100 mL/hr over 30 Minutes Intravenous  Once 10/17/2016 1829 11/04/2016 2101        Objective:   Vitals:   11-17-2016 0515 11-17-16 0518 11-17-2016 0536 November 17, 2016 0716  BP: (!) 82/64  91/76   Pulse:      Resp:      Temp:      TempSrc:      SpO2: 100% 95%    Weight:    53.9 kg (118 lb 14.4 oz)  Height:        Wt Readings from Last 3  Encounters:  11/17/2016 53.9 kg (118 lb 14.4 oz)  10/20/16 51.1 kg (112 lb 12 oz)  10/19/16 49.5 kg (109 lb 3.2 oz)     Intake/Output Summary (Last 24 hours) at 11/17/16 1016 Last data filed at 2016-11-17 0052  Gross per 24 hour  Intake              240 ml  Output              500 ml  Net             -260 ml     Physical exam Gen.: Elderly female restless, moaning in bed on NRB, poorly communicative HEENT: JVD +, moist mucosa, supple neck, on nonrebreather Chest: Bibasilar crackles, no rhonchi or wheeze CVS: S1 and S2 irregular, no murmurs GI: Soft, nondistended, lower abdominal tenderness Musculoskeletal: Warm, 1+ pitting edema bilaterally     Data Review:    CBC  Recent Labs Lab 11/04/2016 1709  WBC 7.4  HGB 12.5  HCT 40.7  PLT 122*  MCV 106.8*  MCH 32.8  MCHC 30.7  RDW 16.9*  LYMPHSABS 1.3  MONOABS 0.7  EOSABS 0.1  BASOSABS 0.0    Chemistries   Recent Labs Lab 10/27/2016 1709 10/18/2016 2237 10/22/16 0317 10/23/16 0539 10/24/16 0346 11-17-16 0417  NA 140  --  139 137 137 140  K 4.6  --  4.7 5.1 4.4 4.8  CL 95*  --  94* 95* 95* 95*  CO2 35*  --  32 27 33* 28  GLUCOSE 73  --  114* 84 92 76  BUN 45*  --  46* 52* 51* 55*  CREATININE 1.94*  --  2.12* 2.39* 2.52* 2.84*  CALCIUM 8.9  --  8.9 9.0 8.9 9.0  MG  --  2.2  --   --   --   --   AST 36  --   --   --   --   --   ALT 17  --   --   --   --   --   ALKPHOS 101  --   --   --   --   --   BILITOT 0.9  --   --   --   --   --    ------------------------------------------------------------------------------------------------------------------ No results for input(s): CHOL, HDL, LDLCALC, TRIG, CHOLHDL, LDLDIRECT in the last 72 hours.  Lab Results  Component Value Date   HGBA1C 5.5 04/26/2016   ------------------------------------------------------------------------------------------------------------------ No results for input(s): TSH, T4TOTAL, T3FREE, THYROIDAB in the last 72 hours.  Invalid input(s):  FREET3 ------------------------------------------------------------------------------------------------------------------ No results for input(s): VITAMINB12, FOLATE, FERRITIN, TIBC, IRON, RETICCTPCT in the last 72 hours.  Coagulation profile No results for input(s): INR, PROTIME in the last 168 hours.  No results for input(s): DDIMER in the last 72 hours.  Cardiac Enzymes No results for input(s): CKMB, TROPONINI, MYOGLOBIN in the last 168 hours.  Invalid input(s): CK ------------------------------------------------------------------------------------------------------------------    Component Value Date/Time   BNP 1,697.4 (H) 10/26/2016 1709    Inpatient Medications  Scheduled Meds: . acetaminophen  500 mg Oral QHS  . lidocaine  1 patch Transdermal Q24H   Continuous Infusions: . morphine 2 mg/hr (November 04, 2016 0902)   PRN Meds:.acetaminophen, cycloSPORINE, glycopyrrolate, LORazepam, ondansetron (ZOFRAN) IV, polyvinyl alcohol, prochlorperazine  Micro Results No results found for this or any previous visit (from the past 240 hour(s)).  Radiology Reports Dg Chest 2 View  Result Date: 10/26/2016 CLINICAL DATA:  Four pound increase in weight over the past 2 days with swelling of the ankles and pitting edema. Dyspnea. EXAM: CHEST  2 VIEW COMPARISON:  09/25/2016 FINDINGS: New bilateral small pleural effusions are noted with pulmonary vascular redistribution and stable cardiomegaly. Likely bibasilar compressive atelectasis secondary to the pleural effusions. Aortic atherosclerosis is noted. Left-sided pacemaker apparatus with right atrial and right ventricular leads are present. No acute nor suspicious osseous abnormality. The bones are demineralized and there is stable midthoracic kyphosis. IMPRESSION: Cardiomegaly with aortic atherosclerosis. Interval development of CHF with small bilateral layering effusions. Electronically Signed   By: Ashley Royalty M.D.   On: 11/02/2016 17:48   Dg Chest 2  View  Result Date: 09/25/2016 CLINICAL DATA:  Weakness today EXAM: CHEST  2 VIEW COMPARISON:  06/13/2016 FINDINGS: The heart is markedly enlarged. Pulmonary vascularity is within normal limits. Left subclavian double lead pacemaker device and leads are stable and intact. Small pleural effusions. No pneumothorax. No evidence of consolidation or lung mass. Thoracic spine is osteopenia. There is kyphosis. IMPRESSION: Cardiomegaly without edema.  Small pleural effusions. Electronically Signed   By: Marybelle Killings M.D.   On: 09/25/2016 13:08   Dg Tibia/fibula Right  Result Date: 09/25/2016 CLINICAL DATA:  Right lower extremity inflammation, swelling, pain EXAM: RIGHT TIBIA AND FIBULA - 2 VIEW COMPARISON:  None. FINDINGS: No acute bony abnormality. Specifically, no fracture, subluxation, or dislocation. Soft tissues are intact. Mild degenerative changes in the right ankle. IMPRESSION: No acute bony abnormality. Electronically Signed   By: Rolm Baptise M.D.   On: 09/25/2016 15:06   Dg Chest Port 1 View  Result Date: 11/04/16 CLINICAL DATA:  83 y/o  F; hypoxia with concern for aspiration. EXAM: PORTABLE CHEST 1 VIEW COMPARISON:  11/08/2016 chest radiograph. FINDINGS: Stable enlarged cardiac silhouette. Aortic  atherosclerosis with calcification. Bilateral pleural effusions and lung opacities in mid to lower lung zones are stable in comparison with prior radiographs given differences in technique. Two lead pacemaker noted. No acute osseous abnormality is evident. IMPRESSION: Bilateral pleural effusions and lung opacities in mid and lower lung zones are stable from prior radiographs given projection. Stable cardiomegaly. Electronically Signed   By: Kristine Garbe M.D.   On: 05-Nov-2016 03:17    Time Spent in minutes  25   Louellen Molder M.D on 2016-11-05 at 10:16 AM  Between 7am to 7pm - Pager - [phone]  After 7pm go to [email or web address] - password [facility]  [facility] -  Office   [phone]

## 2016-11-13 NOTE — Progress Notes (Signed)
Pt is currently sitting on BSC. experiencing nausea PRN Given

## 2016-11-13 NOTE — Plan of Care (Signed)
Problem: Pain Managment: Goal: General experience of comfort will improve Outcome: Progressing Grave Prn medication for comfort and relief.

## 2016-11-13 NOTE — Progress Notes (Signed)
Pt is experiencing pain in stomach and legs rating faces at 10 PRN given for Relief

## 2016-11-13 NOTE — Discharge Summary (Signed)
Physician Discharge Summary  [name] ZOX:[id number] DOB: Mar 12, 1918 DOA: 10/29/2016  PCP: Mina Marble D, NP  Admit date: 10/19/2016 Discharge date: 10/26/2016  Admitted From: Home  Disposition: Patient expired on 8/13 at 2:26 PM    Discharge Diagnoses:  Principal Problem:   Acute on chronic combined systolic and diastolic CHF (congestive heart failure) (HCC)   Active Problems:   Hypotension   Persistent atrial fibrillation (HCC)   Cardiomyopathy, ischemic   Acute respiratory failure with hypoxia [facility])   Palliative care by specialist  Brief narrative/history of present illness 83 year old female with history of ischemic cardiomyopathy, EF of 25-30%, CK D stage III, coronary artery disease who was hospitalized from 7/14-7/16 with dehydration overdiuresis. Her metolazone was stopped and Lasix dose reduced (discharge weight was 95 pounds with creatinine 1.6). She was then seen in the office on 8/1 and since her weight was up a few pounds her Lasix dose was increased.[name] She was seen in the office yesterday and complained of shortness of breath with increased leg swelling and weight gain of 5 pounds in 7 days. Her weight increased to 111 pounds.  Patient was sent home but returned to the ED with progressive shortness of breath, leg swellings and orthopnea.  In the ED she was found to be in acute CHF. Given 40 mg IV Lasix. Also received her dose of IV clindamycin with concern for bilateral lower leg cellulitis. Creatinine of 1.9.  Patient admitted to telemetry. Placed on aggressive IV Lasix for diuresis without improvement. Plan for discharge home with home hospice but overnight on 8/13 developed nausea with vomiting, hypoxia and restlessness .chest x-ray showing fluid overload. Placed on nonrebreather and transitioned  to full comfort measures on morphine drip.  Hospital course Principal Problem:   Acute on chronic combined systolic and diastolic CHF (congestive heart failure)  (HCC) Poor improvement. Transitioned to full comfort.. Started on IV morphine drip. Added Ativan when necessary for anxiety and sleep. Seen by cardiologist and agreed with hospice for comfort. Initial plan was for going home with home hospice but given her worsened clinical condition was transferred to medical for for full comfort.  Patient expired on 11-14-2016 at 2:26 PM.   Family at bedside.  Active Problems:    Persistent atrial fibrillation ([place]) Discontinued home medications in telemetry with goal for comfort.  Acute on chronic kidney disease stage III Suspect cardiorenal syndrome. Renal function continues to worsen. Goal for comfort.  Acute respiratory failure with hypoxia (HCC) Secondary to decompensated CHF. No improvement with diuresis.   Right shoulder pain Chronic.    Barriers For Discharge : Active symptoms  Consults  :   Cardiology Palliative care  Procedures  : None  Discharge Instructions   Allergies as of 11-14-2016      Reactions   Nubain [nalbuphine Hcl] Anaphylaxis   Pineapple Other (See Comments)   Listed on [facility] 06/2015   Iodine Rash   Mirtazapine Other (See Comments)   "Weakness in legs"   Statins Other (See Comments)   Leg weakness      Allergies  Allergen Reactions  . Nubain [Nalbuphine Hcl] Anaphylaxis  . Pineapple Other (See Comments)    Listed on [facility] 06/2015  . Iodine Rash  . Mirtazapine Other (See Comments)    "Weakness in legs"  . Statins Other (See Comments)    Leg weakness     Procedures/Studies: Dg Chest 2 View  Result Date: 10/18/2016 CLINICAL DATA:  Four pound increase in weight over the past 2  days with swelling of the ankles and pitting edema. Dyspnea. EXAM: CHEST  2 VIEW COMPARISON:  09/25/2016 FINDINGS: New bilateral small pleural effusions are noted with pulmonary vascular redistribution and stable cardiomegaly. Likely bibasilar compressive atelectasis secondary to the pleural effusions. Aortic atherosclerosis is  noted. Left-sided pacemaker apparatus with right atrial and right ventricular leads are present. No acute nor suspicious osseous abnormality. The bones are demineralized and there is stable midthoracic kyphosis. IMPRESSION: Cardiomegaly with aortic atherosclerosis. Interval development of CHF with small bilateral layering effusions. Electronically Signed   By: Ashley Royalty M.D.   On: 10/17/2016 17:48   Dg Chest Port 1 View  Result Date: 11-13-2016 CLINICAL DATA:  83 y/o  F; hypoxia with concern for aspiration. EXAM: PORTABLE CHEST 1 VIEW COMPARISON:  11/11/2016 chest radiograph. FINDINGS: Stable enlarged cardiac silhouette. Aortic atherosclerosis with calcification. Bilateral pleural effusions and lung opacities in mid to lower lung zones are stable in comparison with prior radiographs given differences in technique. Two lead pacemaker noted. No acute osseous abnormality is evident. IMPRESSION: Bilateral pleural effusions and lung opacities in mid and lower lung zones are stable from prior radiographs given projection. Stable cardiomegaly. Electronically Signed   By: Kristine Garbe M.D.   On: 13-Nov-2016 03:17       Discharge Exam: Vitals:   11/13/16 0536 2016/11/13 1050  BP: 91/76 (!) 63/43  Pulse:  95  Resp:    Temp:  (!) 97.3 F (36.3 C)  SpO2:  94%   Vitals:   11-13-2016 0536 11-13-2016 0716 11/13/16 1050 2016/11/13 1435  BP: 91/76  (!) 63/43   Pulse:   95   Resp:      Temp:   (!) 97.3 F (36.3 C)   TempSrc:   Axillary   SpO2:   94%   Weight:  53.9 kg (118 lb 14.4 oz)  53.9 kg (118 lb 13.3 oz)  Height:          The results of significant diagnostics from this hospitalization (including imaging, microbiology, ancillary and laboratory) are listed below for reference.     Microbiology: No results found for this or any previous visit (from the past 240 hour(s)).   Labs: BNP (last 3 results)  Recent Labs  06/13/16 2343 10/13/2016 1709  BNP 903.0* 1,697.4*   Basic  Metabolic Panel:  Recent Labs Lab 11/12/2016 1709 11/09/2016 2237 10/22/16 0317 10/23/16 0539 10/24/16 0346 Nov 13, 2016 0417  NA 140  --  139 137 137 140  K 4.6  --  4.7 5.1 4.4 4.8  CL 95*  --  94* 95* 95* 95*  CO2 35*  --  32 27 33* 28  GLUCOSE 73  --  114* 84 92 76  BUN 45*  --  46* 52* 51* 55*  CREATININE 1.94*  --  2.12* 2.39* 2.52* 2.84*  CALCIUM 8.9  --  8.9 9.0 8.9 9.0  MG  --  2.2  --   --   --   --    Liver Function Tests:  Recent Labs Lab 11/08/2016 1709  AST 36  ALT 17  ALKPHOS 101  BILITOT 0.9  PROT 6.2*  ALBUMIN 3.1*   No results for input(s): LIPASE, AMYLASE in the last 168 hours. No results for input(s): AMMONIA in the last 168 hours. CBC:  Recent Labs Lab 11/02/2016 1709  WBC 7.4  NEUTROABS 5.3  HGB 12.5  HCT 40.7  MCV 106.8*  PLT 122*   Cardiac Enzymes: No results for input(s): CKTOTAL, CKMB, CKMBINDEX,  TROPONINI in the last 168 hours. BNP: Invalid input(s): POCBNP CBG: No results for input(s): GLUCAP in the last 168 hours. D-Dimer No results for input(s): DDIMER in the last 72 hours. Hgb A1c No results for input(s): HGBA1C in the last 72 hours. Lipid Profile No results for input(s): CHOL, HDL, LDLCALC, TRIG, CHOLHDL, LDLDIRECT in the last 72 hours. Thyroid function studies No results for input(s): TSH, T4TOTAL, T3FREE, THYROIDAB in the last 72 hours.  Invalid input(s): FREET3 Anemia work up No results for input(s): VITAMINB12, FOLATE, FERRITIN, TIBC, IRON, RETICCTPCT in the last 72 hours. Urinalysis    Component Value Date/Time   COLORURINE YELLOW 11/07/2016 2100   APPEARANCEUR HAZY (A) 10/20/2016 2100   LABSPEC 1.011 11/01/2016 2100   PHURINE 6.0 10/15/2016 2100   GLUCOSEU NEGATIVE 10/27/2016 2100   HGBUR MODERATE (A) 11/09/2016 2100   BILIRUBINUR NEGATIVE 11/06/2016 2100   BILIRUBINUR neg 06/19/2014 [place] NEGATIVE 10/28/2016 2100   PROTEINUR 30 (A) 11/01/2016 2100   UROBILINOGEN 0.2 01/26/2015 1230   NITRITE POSITIVE  (A) 10/15/2016 2100   LEUKOCYTESUR LARGE (A) 10/13/2016 2100   Sepsis Labs Invalid input(s): PROCALCITONIN,  WBC,  LACTICIDVEN Microbiology No results found for this or any previous visit (from the past 240 hour(s)).   Time coordinating discharge: <30 minutes  SIGNED:   Louellen Molder, MD  [facility] 10/26/2016, 5:00 PM Pager   If 7PM-7AM, please contact night-coverage [email or web address] Password [email or web address]

## 2016-11-13 NOTE — Progress Notes (Signed)
Verified death of pt with Baird Cancer, RN at [phone].   Wasted 260ml of remaining morphine drip bag with Baird Cancer, Rn in the sink.

## 2016-11-13 NOTE — Progress Notes (Signed)
Pt restless, in pain, and uncomfortable.  Pt currently on non rebreather sats 98%.  MD paged and palliative paged.  MD gave order for morphine gtt and order to transfer to 6N.  Orders carried out.  Will continue to monitor.

## 2016-11-13 NOTE — Progress Notes (Signed)
Patient ID: [name], female   DOB: 1917/06/12, 83 y.o.   MRN: [id number] The patient was seen earlier due to nausea, emesis, hypoxia and restlessness. Her vital signs were pulse 100, respirations 18, blood pressure 105/52 mmHg and O2 sats 77% on nasal cannula oxygen. O2 sat improved to the high 90s with nonrebreather mask oxygen.  Gen. Elderly, frail, restless and confused. Answers simple questions, but does not follow commands. Head: Normocephalic, NRB oxygen mask on. Neck: Supple. Positive JVD. Lungs: Decreased breath sounds on lower and mid lung fields with rales bilaterally. No wheezing or rhonchi. CV: S1 and S2, irregularly irregular rhythm, no murmur. Abdomen: Soft, nontender. Extremities: 1+ lower extremity edema.   Assessment:    Acute on chronic combined systolic and diastolic CHF Given level of mentation and recent emesis, not a good candidate for BiPAP ventilation. Check chest radiograph. Continue on supplemental oxygen. Low dose DuoNeb every 4 hours. May need to hold furosemide on the next dose due to hypotension with systolic in the low 262M or 90s. She also has worsening renal function. Her overall prognosis given severity and the patient's age is poor.    Nausea and emesis Her abdomen is soft and nondistended. Nothing by mouth until reevaluated in the morning. Continue Zofran 4 mg IVP every 6 hours when necessary. Will add Compazine 5 mg IVP every 6 hours as needed.  Tennis Must, M.D.  Over 60 minutes were spent during the review of chart, bedside evaluation evaluation, treatment decisions, follow-up of radiology studies, etc.

## 2016-11-13 NOTE — Consult Note (Signed)
Consultation Note Date: Nov 03, 2016   Patient Name: [name]  DOB: [birth year]  MRN: [id number]  Age / Sex: 83 y.o., female  PCP: Esaw Grandchild, NP Referring Physician: Louellen Molder, MD  Reason for Consultation: Establishing goals of care, Non pain symptom management and Psychosocial/spiritual support  HPI/Patient Profile: 83 y.o. female  admitted on 11/10/2016 with medical history significant of CAD, ICM, CHF with most recent EF 25-30% on 2d echo last month, CKD   Patient recently admitted to our service 7/14-7/16 with dehydration / overdiuresis at that time, admission weight ~92.x lbs.  Zaroxolyn stopped and lasix dose reduced.  Discharge weight ~95 lbs.  Creat at time of admission 1.9, improved to 1.6 at discharge.  EF noted to be 25-30% which is significantly down from prior echo in 2016.   Now patient presents to ED with c/o SOB, peripheral edema, recent weight gain of 5lbs in past 7 days.  Weight now 110.8 lbs (daughter keeps a log of daily weights on patient).  Family spoke with attending and has made  decision to shift to a full comfort path.   Clinical Assessment and Goals of Care:    This NP Wadie Lessen reviewed medical records, received report from team, assessed the patient and then meet at the patient's bedside along with her two daughters to discuss diagnosis, prognosis, GOC, EOL wishes disposition and options.    The difference between a aggressive medical intervention path  and a palliative comfort care path for this patient at this time was had.    Values and goals of care important to patient and family were attempted to be elicited.  Concept of Hospice and Palliative Care were discussed  Natural trajectory and expectations at EOL were discussed.  Questions and concerns addressed.   Family encouraged to call with questions or concerns.  PMT will continue to support  holistically.   NEXT OF KIN    SUMMARY OF RECOMMENDATIONS    Code Status/Advance Care Planning:  DNR    Symptom Management:   Dyspnea: Morphine gtt initiated by attending  Terminal secretions: Rubinol  Agitation: Ativan  Palliative Prophylaxis:   Frequent Pain Assessment and Oral Care  Additional Recommendations (Limitations, Scope, Preferences):  Full Comfort Care  Psycho-social/Spiritual:     Additional Recommendations: Grief/Bereavement Support  Prognosis:   Hours - Days  Discharge Planning: Anticipated Hospital Death      Primary Diagnoses: Present on Admission: . Acute on chronic combined systolic and diastolic CHF (congestive heart failure) ([place]) . (Resolved) Acute on chronic combined systolic (congestive) and diastolic (congestive) heart failure ([place]) . Cardiomyopathy, ischemic . Hypotension . Persistent atrial fibrillation ([place]) . Acute respiratory failure with hypoxia ([place])   I have reviewed the medical record, interviewed the patient and family, and examined the patient. The following aspects are pertinent.  Past Medical History:  Diagnosis Date  . Abdominal aortic aneurysm ([place])   . Anemia    hx  . Asthma    hx  . Atrial fibrillation ([place])   . Blood  transfusion   . Blood transfusion without reported diagnosis   . CAD (coronary artery disease)   . CHF (congestive heart failure) ([place])   . COPD (chronic obstructive pulmonary disease) ([place])   . GERD (gastroesophageal reflux disease)   . GI bleed 2007  . H/O hiatal hernia   . Heart attack ([place])   . Heart disease   . History of right hip replacement 2013  . Hyperlipidemia   . Hypertension   . Hypothyroidism   . Myocardial infarction ([place])   . Osteoporosis   . Pacemaker   . Renal failure, chronic   . SSS (sick sinus syndrome) ([place]) 10/05/2007   Medtronic Adapta  . Stroke ([place])   . TIA (transient ischemic attack)   . UTI (lower urinary tract infection)    hx   Social History     Social History  . Marital status: Widowed    Spouse name: N/A  . Number of children: 4  . Years of education: N/A   Occupational History  . Retired    Social History Main Topics  . Smoking status: Former Smoker    Types: Cigarettes  . Smokeless tobacco: Never Used     Comment: quit 40 yrs ago- was smoking 2 packs a day at the most  . Alcohol use No  . Drug use: No  . Sexual activity: Yes    Birth control/ protection: Post-menopausal   Other Topics Concern  . None   Social History Narrative   Diet:      Do you drink/ eat things with caffeine? yes      Marital status:    widow                           What year were you married ?  1940      Do you live in a house, apartment,assistred living, condo, trailer, etc.)?  Mobile home      Is it one or more stories? 1      How many persons live in your home ?  1      Do you have any pets in your home ?(please list)  1 cat      Current or past profession: factory worker      Do you exercise?  yes                           Type & how often: small amount      Do you have a living will?  no      Do you have a DNR form?     no                  If not, do you want to discuss one?       Do you have signed POA?HPOA forms?  no               If so, please bring to your        appointment         Family History  Problem Relation Age of Onset  . Emphysema Father   . Heart disease Mother   . Cancer Brother   . Cancer Brother   . Cancer Sister   . Cancer Sister   . Diabetes Daughter   . Atrial fibrillation Daughter   . Atrial fibrillation Daughter   . Stroke Daughter   .  Heart Problems Son    Scheduled Meds: . acetaminophen  500 mg Oral QHS  . lidocaine  1 patch Transdermal Q24H   Continuous Infusions: . morphine 2 mg/hr (11/16/16 0902)   PRN Meds:.acetaminophen, cycloSPORINE, glycopyrrolate, LORazepam, ondansetron (ZOFRAN) IV, polyvinyl alcohol, prochlorperazine Medications Prior to Admission:  Prior to  Admission medications   Medication Sig Start Date End Date Taking? Authorizing Provider  acetaminophen (TYLENOL) 500 MG tablet Take 1 tablet (500 mg total) by mouth at bedtime. 04/26/16  Yes Danford, Valetta Fuller D, NP  amiodarone (PACERONE) 100 MG tablet Take 1 tablet (100 mg total) by mouth daily. 08/18/16  Yes Croitoru, Mihai, MD  Biotin 1000 MCG tablet Take 1 tablet (1 mg total) by mouth daily. 04/26/16  Yes Danford, Valetta Fuller D, NP  Calcium Carbonate-Vitamin D (CALCIUM 600+D) 600-400 MG-UNIT tablet Take 1 tablet by mouth daily. 04/26/16  Yes Danford, Katy D, NP  carboxymethylcellulose 1 % ophthalmic solution Apply 1 drop to eye 3 (three) times daily. Patient taking differently: Apply 1 drop to eye 3 (three) times daily as needed (dry eyes).  04/26/16  Yes Danford, Valetta Fuller D, NP  clopidogrel (PLAVIX) 75 MG tablet TAKE 1 TABLET (75 MG TOTAL) BY MOUTH DAILY. 08/06/16  Yes Croitoru, Mihai, MD  cycloSPORINE (RESTASIS) 0.05 % ophthalmic emulsion Place 1 drop into both eyes 2 (two) times daily as needed (dryness).    Yes [provider]  feeding supplement (BOOST HIGH PROTEIN) LIQD Take 1 Container by mouth See admin instructions. Drink 1 everyday and another as needed for nutrition supplement.   Yes [provider]  furosemide (LASIX) 80 MG tablet TAKE 1 TABLET BY MOUTH DAILY EXCEPT FOR TUESDAYS AND FRIDAYS, YOU TAKE 1 TABLET BY MOUTH TWICE DAY 10/13/16  Yes Barrett, Rhonda G, PA-C  hydrOXYzine (ATARAX/VISTARIL) 10 MG tablet Take 0.5 tablets (5 mg total) by mouth as directed. 1/2 tab in morning and afternoon.  Full tablet at night. 10/20/16  Yes Danford, Valetta Fuller D, NP  isosorbide mononitrate (IMDUR) 30 MG 24 hr tablet Take 1 tablet (30 mg total) by mouth daily. 06/24/16 11/10/2016 Yes Croitoru, Mihai, MD  levothyroxine (SYNTHROID, LEVOTHROID) 112 MCG tablet Take 1 tablet (112 mcg total) by mouth daily before breakfast. 09/08/16  Yes Danford, Valetta Fuller D, NP  loratadine (CLARITIN) 10 MG tablet Take 10 mg by mouth daily.     Yes [provider]  nitroGLYCERIN (NITROSTAT) 0.4 MG SL tablet Place 1 tablet (0.4 mg total) under the tongue every 5 (five) minutes as needed for chest pain (For a total of 3 tablets, if chest pain persist to call 911). 10/15/14  Yes Eubanks, Carlos American, NP  ranitidine (ZANTAC) 300 MG capsule Take 1 capsule (300 mg total) by mouth every evening. 04/26/16  Yes Danford, Valetta Fuller D, NP  rOPINIRole (REQUIP) 0.25 MG tablet Take 2 tablets (0.5 mg total) by mouth at bedtime. 08/05/16  Yes Danford, Berna Spare, NP   Allergies  Allergen Reactions  . Nubain [Nalbuphine Hcl] Anaphylaxis  . Pineapple Other (See Comments)    Listed on [facility] 06/2015  . Iodine Rash  . Mirtazapine Other (See Comments)    "Weakness in legs"  . Statins Other (See Comments)    Leg weakness   Review of Systems  Unable to perform ROS: Acuity of condition    Physical Exam  Constitutional: She appears lethargic. She appears ill.  -frail, elderly  Cardiovascular: Tachycardia present.   Pulmonary/Chest: She has decreased breath sounds.  Neurological: She appears lethargic.  Skin: Skin is  warm and dry.  - feet cool and mottled    Vital Signs: BP 91/76   Pulse (!) 108   Temp 97.7 F (36.5 C) (Oral)   Resp 16   Ht 5\' 3"  (1.6 m)   Wt 53.9 kg (118 lb 14.4 oz)   SpO2 95%   BMI 21.06 kg/m  Pain Assessment: 0-10 POSS *See Group Information*: S-Acceptable,Sleep, easy to arouse Pain Score: 0-No pain   SpO2: SpO2: 95 % O2 Device:SpO2: 95 % O2 Flow Rate: .O2 Flow Rate (L/min): 5 L/min  IO: Intake/output summary:  Intake/Output Summary (Last 24 hours) at 2016-11-14 8295 Last data filed at 2016-11-14 0052  Gross per 24 hour  Intake              480 ml  Output              500 ml  Net              -20 ml    LBM: Last BM Date: 10/23/16 Baseline Weight: Weight: 52.1 kg (114 lb 13.8 oz) (bed scale) Most recent weight: Weight: 53.9 kg (118 lb 14.4 oz)     Palliative Assessment/Data: 30 % at best   Discussed with Dr  Clementeen Graham  Time In: 0845 Time Out: 0945 Time Total: 60 min Greater than 50%  of this time was spent counseling and coordinating care related to the above assessment and plan.  Signed by: Wadie Lessen, NP   Please contact Palliative Medicine Team phone at [phone] for questions and concerns.  For individual provider: See Shea Evans

## 2016-11-13 NOTE — Plan of Care (Signed)
Problem: Fluid Volume: Goal: Ability to maintain a balanced intake and output will improve Outcome: Not Progressing Pt has decreased output w/ decreased kidney function and low blood pressures.

## 2016-11-13 NOTE — Progress Notes (Signed)
Pt is a awake and restless and moaning taking mask off, switched to 5l on n/c blood pressure 82/68 with a MAP of 68.

## 2016-11-13 NOTE — Progress Notes (Signed)
Patient arrived to 6N11, disoriented x4, on nonrebreather, slightly restless will medicate per orders, IV morphine drip infusing. Family at bedside, showed them where our conference/sitting rooms were, oriented them to room and staff. Will continue to monitor. Also ordered comfort cart for the large amount of family/visitors in room.

## 2016-11-13 NOTE — Progress Notes (Signed)
Pt. Is resting on non-rebreather, sats 97% MD updated on status, plan to hold lasix for low Blood pressure and kidney function.

## 2016-11-13 DEATH — deceased

## 2016-11-18 ENCOUNTER — Ambulatory Visit: Payer: Medicare HMO | Admitting: Adult Health

## 2016-11-22 NOTE — Telephone Encounter (Signed)
Error

## 2016-12-29 ENCOUNTER — Ambulatory Visit: Payer: Medicare HMO | Admitting: Adult Health
